# Patient Record
Sex: Male | Born: 1935 | Race: White | Hispanic: No | Marital: Married | Smoking: Former smoker
Health system: Southern US, Community
[De-identification: ages and names within clinical notes are randomized; demographics above are authoritative.]

## PROBLEM LIST (undated history)

## (undated) DIAGNOSIS — G4733 Obstructive sleep apnea (adult) (pediatric): Secondary | ICD-10-CM

## (undated) DIAGNOSIS — D696 Thrombocytopenia, unspecified: Secondary | ICD-10-CM

## (undated) DIAGNOSIS — J449 Chronic obstructive pulmonary disease, unspecified: Secondary | ICD-10-CM

## (undated) DIAGNOSIS — C3490 Malignant neoplasm of unspecified part of unspecified bronchus or lung: Secondary | ICD-10-CM

## (undated) DIAGNOSIS — G250 Essential tremor: Principal | ICD-10-CM

## (undated) DIAGNOSIS — D693 Immune thrombocytopenic purpura: Secondary | ICD-10-CM

## (undated) DIAGNOSIS — I714 Abdominal aortic aneurysm, without rupture: Secondary | ICD-10-CM

## (undated) DIAGNOSIS — H532 Diplopia: Secondary | ICD-10-CM

## (undated) DIAGNOSIS — E669 Obesity, unspecified: Secondary | ICD-10-CM

## (undated) DIAGNOSIS — N200 Calculus of kidney: Secondary | ICD-10-CM

## (undated) DIAGNOSIS — N400 Enlarged prostate without lower urinary tract symptoms: Secondary | ICD-10-CM

## (undated) DIAGNOSIS — E1142 Type 2 diabetes mellitus with diabetic polyneuropathy: Secondary | ICD-10-CM

## (undated) DIAGNOSIS — D126 Benign neoplasm of colon, unspecified: Secondary | ICD-10-CM

## (undated) DIAGNOSIS — C679 Malignant neoplasm of bladder, unspecified: Secondary | ICD-10-CM

## (undated) DIAGNOSIS — R059 Cough, unspecified: Secondary | ICD-10-CM

## (undated) DIAGNOSIS — S42309A Unspecified fracture of shaft of humerus, unspecified arm, initial encounter for closed fracture: Secondary | ICD-10-CM

## (undated) DIAGNOSIS — R05 Cough: Secondary | ICD-10-CM

## (undated) DIAGNOSIS — E785 Hyperlipidemia, unspecified: Secondary | ICD-10-CM

## (undated) DIAGNOSIS — I10 Essential (primary) hypertension: Secondary | ICD-10-CM

## (undated) DIAGNOSIS — C3491 Malignant neoplasm of unspecified part of right bronchus or lung: Secondary | ICD-10-CM

## (undated) DIAGNOSIS — G4700 Insomnia, unspecified: Secondary | ICD-10-CM

## (undated) DIAGNOSIS — I2510 Atherosclerotic heart disease of native coronary artery without angina pectoris: Secondary | ICD-10-CM

## (undated) DIAGNOSIS — I219 Acute myocardial infarction, unspecified: Secondary | ICD-10-CM

## (undated) DIAGNOSIS — Z8619 Personal history of other infectious and parasitic diseases: Secondary | ICD-10-CM

## (undated) DIAGNOSIS — K439 Ventral hernia without obstruction or gangrene: Secondary | ICD-10-CM

## (undated) DIAGNOSIS — G252 Other specified forms of tremor: Principal | ICD-10-CM

## (undated) DIAGNOSIS — R269 Unspecified abnormalities of gait and mobility: Secondary | ICD-10-CM

## (undated) DIAGNOSIS — Z720 Tobacco use: Secondary | ICD-10-CM

## (undated) DIAGNOSIS — N2889 Other specified disorders of kidney and ureter: Secondary | ICD-10-CM

## (undated) DIAGNOSIS — K5790 Diverticulosis of intestine, part unspecified, without perforation or abscess without bleeding: Secondary | ICD-10-CM

## (undated) DIAGNOSIS — N189 Chronic kidney disease, unspecified: Secondary | ICD-10-CM

## (undated) HISTORY — DX: Benign prostatic hyperplasia without lower urinary tract symptoms: N40.0

## (undated) HISTORY — DX: Unspecified fracture of shaft of humerus, unspecified arm, initial encounter for closed fracture: S42.309A

## (undated) HISTORY — DX: Chronic kidney disease, unspecified: N18.9

## (undated) HISTORY — DX: Malignant neoplasm of unspecified part of unspecified bronchus or lung: C34.90

## (undated) HISTORY — DX: Other specified disorders of kidney and ureter: N28.89

## (undated) HISTORY — DX: Atherosclerotic heart disease of native coronary artery without angina pectoris: I25.10

## (undated) HISTORY — DX: Immune thrombocytopenic purpura: D69.3

## (undated) HISTORY — DX: Insomnia, unspecified: G47.00

## (undated) HISTORY — DX: Calculus of kidney: N20.0

## (undated) HISTORY — DX: Other specified forms of tremor: G25.2

## (undated) HISTORY — DX: Diplopia: H53.2

## (undated) HISTORY — DX: Malignant neoplasm of bladder, unspecified: C67.9

## (undated) HISTORY — DX: Obesity, unspecified: E66.9

## (undated) HISTORY — PX: CYSTECTOMY: SUR359

## (undated) HISTORY — DX: Personal history of other infectious and parasitic diseases: Z86.19

## (undated) HISTORY — PX: LUNG LOBECTOMY: SHX167

## (undated) HISTORY — DX: Tobacco use: Z72.0

## (undated) HISTORY — DX: Diverticulosis of intestine, part unspecified, without perforation or abscess without bleeding: K57.90

## (undated) HISTORY — PX: TRANSURETHRAL RESECTION OF PROSTATE: SHX73

## (undated) HISTORY — PX: TONSILLECTOMY: SUR1361

## (undated) HISTORY — DX: Malignant neoplasm of unspecified part of right bronchus or lung: C34.91

## (undated) HISTORY — DX: Type 2 diabetes mellitus with diabetic polyneuropathy: E11.42

## (undated) HISTORY — DX: Abdominal aortic aneurysm, without rupture: I71.4

## (undated) HISTORY — DX: Chronic obstructive pulmonary disease, unspecified: J44.9

## (undated) HISTORY — DX: Obstructive sleep apnea (adult) (pediatric): G47.33

## (undated) HISTORY — DX: Ventral hernia without obstruction or gangrene: K43.9

## (undated) HISTORY — PX: CARDIAC CATHETERIZATION: SHX172

## (undated) HISTORY — DX: Thrombocytopenia, unspecified: D69.6

## (undated) HISTORY — DX: Hyperlipidemia, unspecified: E78.5

## (undated) HISTORY — DX: Unspecified abnormalities of gait and mobility: R26.9

## (undated) HISTORY — PX: CYSTOSTOMY W/ BLADDER BIOPSY: SHX1431

## (undated) HISTORY — DX: Benign neoplasm of colon, unspecified: D12.6

## (undated) HISTORY — DX: Essential tremor: G25.0

---

## 1994-07-21 DIAGNOSIS — C679 Malignant neoplasm of bladder, unspecified: Secondary | ICD-10-CM

## 1994-07-21 HISTORY — DX: Malignant neoplasm of bladder, unspecified: C67.9

## 2001-01-07 ENCOUNTER — Other Ambulatory Visit: Admission: RE | Admit: 2001-01-07 | Discharge: 2001-01-07 | Payer: Self-pay | Admitting: Dermatology

## 2001-09-15 ENCOUNTER — Ambulatory Visit (HOSPITAL_COMMUNITY): Admission: RE | Admit: 2001-09-15 | Discharge: 2001-09-15 | Payer: Self-pay | Admitting: Internal Medicine

## 2001-09-15 HISTORY — PX: COLONOSCOPY: SHX174

## 2002-07-21 DIAGNOSIS — I714 Abdominal aortic aneurysm, without rupture, unspecified: Secondary | ICD-10-CM

## 2002-07-21 HISTORY — PX: ABDOMINAL AORTIC ANEURYSM REPAIR: SUR1152

## 2002-07-21 HISTORY — DX: Abdominal aortic aneurysm, without rupture, unspecified: I71.40

## 2002-07-21 HISTORY — DX: Abdominal aortic aneurysm, without rupture: I71.4

## 2003-03-26 ENCOUNTER — Emergency Department (HOSPITAL_COMMUNITY): Admission: EM | Admit: 2003-03-26 | Discharge: 2003-03-26 | Payer: Self-pay | Admitting: *Deleted

## 2003-05-12 ENCOUNTER — Ambulatory Visit (HOSPITAL_COMMUNITY): Admission: RE | Admit: 2003-05-12 | Discharge: 2003-05-12 | Payer: Self-pay | Admitting: Family Medicine

## 2003-05-12 ENCOUNTER — Encounter: Payer: Self-pay | Admitting: Family Medicine

## 2003-12-26 ENCOUNTER — Ambulatory Visit (HOSPITAL_BASED_OUTPATIENT_CLINIC_OR_DEPARTMENT_OTHER): Admission: RE | Admit: 2003-12-26 | Discharge: 2003-12-26 | Payer: Self-pay | Admitting: Family Medicine

## 2004-11-28 ENCOUNTER — Ambulatory Visit (HOSPITAL_COMMUNITY): Admission: RE | Admit: 2004-11-28 | Discharge: 2004-11-28 | Payer: Self-pay | Admitting: Internal Medicine

## 2004-11-28 ENCOUNTER — Ambulatory Visit: Payer: Self-pay | Admitting: Internal Medicine

## 2004-11-28 HISTORY — PX: COLONOSCOPY: SHX174

## 2005-07-30 ENCOUNTER — Emergency Department (HOSPITAL_COMMUNITY): Admission: EM | Admit: 2005-07-30 | Discharge: 2005-07-30 | Payer: Self-pay | Admitting: Emergency Medicine

## 2005-08-02 ENCOUNTER — Emergency Department (HOSPITAL_COMMUNITY): Admission: EM | Admit: 2005-08-02 | Discharge: 2005-08-02 | Payer: Self-pay | Admitting: Emergency Medicine

## 2008-06-14 ENCOUNTER — Ambulatory Visit: Admission: RE | Admit: 2008-06-14 | Discharge: 2008-06-14 | Payer: Self-pay | Admitting: Family Medicine

## 2010-01-16 ENCOUNTER — Ambulatory Visit (HOSPITAL_COMMUNITY): Admission: RE | Admit: 2010-01-16 | Discharge: 2010-01-16 | Payer: Self-pay | Admitting: Family Medicine

## 2010-02-08 ENCOUNTER — Encounter: Payer: Self-pay | Admitting: Internal Medicine

## 2010-02-14 ENCOUNTER — Telehealth (INDEPENDENT_AMBULATORY_CARE_PROVIDER_SITE_OTHER): Payer: Self-pay | Admitting: *Deleted

## 2010-02-14 ENCOUNTER — Encounter: Payer: Self-pay | Admitting: Internal Medicine

## 2010-02-20 ENCOUNTER — Ambulatory Visit: Payer: Self-pay | Admitting: Internal Medicine

## 2010-02-20 ENCOUNTER — Ambulatory Visit (HOSPITAL_COMMUNITY): Admission: RE | Admit: 2010-02-20 | Discharge: 2010-02-20 | Payer: Self-pay | Admitting: Internal Medicine

## 2010-02-23 ENCOUNTER — Encounter: Payer: Self-pay | Admitting: Internal Medicine

## 2010-03-19 HISTORY — PX: COLONOSCOPY: SHX174

## 2010-07-21 DIAGNOSIS — N200 Calculus of kidney: Secondary | ICD-10-CM

## 2010-07-21 HISTORY — DX: Calculus of kidney: N20.0

## 2010-08-20 NOTE — Letter (Signed)
Summary: External Correspondence  External Correspondence   Imported By: Craige Cotta 02/14/2010 17:13:49  _____________________________________________________________________  External Attachment:    Type:   Image     Comment:   External Document

## 2010-08-20 NOTE — Letter (Signed)
Summary: Patient Notice, Colon Biopsy Results  [facility]  [address]   [place], [place] [address]   Phone: [phone]  Fax: [phone]       February 23, 2010   SCOTTE GILDEA [address] [place], [place]  [address] 1934-04-04    Dear Mr. Novelo,  I am pleased to inform you that the biopsies taken during your recent colonoscopy did not show any evidence of cancer upon pathologic examination.  Additional information/recommendations:  No further action is needed at this time.  Please follow-up with your primary care physician for your other healthcare needs.  You should have a repeat colonoscopy examination  in 5 years.  Please call us if you are having persistent problems or have questions about your condition that have not been fully answered at this time.  Sincerely,    R. Garfield Cornea MD, [facility] Ph: [phone]    Fax: [phone]   Appended Document: Patient Notice, Colon Biopsy Results letter mailed to pt  Appended Document: Patient Notice, Colon Biopsy Results reminder in computer

## 2010-08-20 NOTE — Progress Notes (Signed)
Summary: diabetic and iron pills  Phone Note Call from Patient   Reason for Call: Talk to Nurse Summary of Call: PT called this morning to let us know that his PCP said he was a diabetic, but is being treated through diet. He also is taking an iron supplement pill (OTC) Initial call taken by: Zeb Comfort,  February 14, 2010 11:21 AM

## 2010-08-20 NOTE — Letter (Signed)
Summary: Internal Other Kyle [name]  Internal Other Kyle [name]   Imported By: Waldon Merl LPN [id number]  _____________________________________________________________________  External Attachment:    Type:   Image     Comment:   External Document  Appended Document: Internal Other Kyle [name] Needing to change date or time of procedure due to double book of OR that day.  Have pt talk to Leafy Ro he is her husband's uncle and it would probably be easier to [facility]  Appended Document: Internal Other /triage Moved him down one hour on same day's schedule.  Patient aware and he has asked [place] to contact nephrologist in [place] RE: the use of the fleet enema -- Dr. Veva Holes [phone].Marland KitchenMarland Kitchenwe will f/u and call him back  Appended Document: Internal Other /triage [facility] to not do fleets enema and that we had received documentation from Dr. Shon Millet (see external other scanned doc) confirming this.  Explained to pour out fleets and fill with tap water instead.  And, to call if he still had further Qs

## 2010-10-04 LAB — GLUCOSE, CAPILLARY: Glucose-Capillary: 133 mg/dL — ABNORMAL HIGH (ref 70–99)

## 2010-12-03 NOTE — Procedures (Signed)
NAME:  Kyle [name], Kyle [name]                ACCOUNT NO.:  [id number]   MEDICAL RECORD NO.:  [id number]          PATIENT TYPE:  OUT   LOCATION:  SLEE                          FACILITY:  [facility]   PHYSICIAN:  Kofi A. Merlene Laughter, M.D. DATE OF BIRTH:  02/04/1934   DATE OF PROCEDURE:  06/14/2008  DATE OF DISCHARGE:  06/14/2008                             SLEEP DISORDER REPORT   This is a nocturnal polysomnography report.   REFERRING PHYSICIAN:  Rosemary Holms, MD recording date June 14, 2008.   INDICATIONS:  A 77 year old man who has a known history of obstructive  syndrome.   MEDICATIONS:  1. Metoprolol.  2. Enalapril.  3. Simvastatin.  4. Aspirin.   Epworth sleepiness scale 15, BMI 38.   ARCHITECTURAL SUMMARY:  This is a titration study.  The total recording  time is 410 minutes.  Sleep efficiency 79%, sleep latency 10 minutes,  REM latency 149 minutes.  Stage N1 - 9%, N2 - 72%, N3 - 1% and REM sleep  18%.   RESPIRATORY SUMMARY:  The patient was titrated between pressures of 6  and 8 with optimal pressure of 8 observed which resulted in elimination  of obstructive events.   LIMB MOVEMENT SUMMARY:  PLM index is 19.   ELECTROCARDIOGRAM SUMMARY:  Average heart rate 63 with no significant dysrhythmias observed.   IMPRESSION:  1. Obstructive sleep apnea syndrome which responded well to a      continuous positive airway pressure of 8.  2. Moderate periodic limb movement disorder sleep.   Thank you for this referral      Kofi A. Merlene Laughter, M.D.  Electronically Signed     KAD/MEDQ  D:  06/19/2008  T:  06/19/2008  Job:  [id number]

## 2010-12-06 NOTE — Op Note (Signed)
NAME:  Kyle [name], Kyle [name]                ACCOUNT NO.:  [id number]   MEDICAL RECORD NO.:  [id number]          PATIENT TYPE:  AMB   LOCATION:  DAY                           FACILITY:  [facility]   PHYSICIAN:  R. Garfield Cornea, M.D. DATE OF BIRTH:  January 18, 1934   DATE OF PROCEDURE:  11/28/2004  DATE OF DISCHARGE:                                 OPERATIVE REPORT   PROCEDURE:  Colonoscopy with biopsy.   INDICATIONS FOR PROCEDURE:  The patient is a 77 year old gentleman with a  history of colonic polyps removed in 2003. He is here for surveillance. He  is not having any problems. No GI symptoms whatsoever. Colonoscopy is now  being. Potential risks, benefits, and alternatives have been reviewed and  questions answered. He is agreeable. Please see documentation in the medical  record.   PROCEDURE NOTE:  O2 saturation, blood pressure, pulse, and respirations were  monitored throughout the entire procedure. Conscious sedation with Versed 3  mg IV and Demerol 50 mg IV in divided doses.   INSTRUMENT:  Olympus video chip system.   FINDINGS:  Digital rectal exam revealed no abnormalities.   ENDOSCOPIC FINDINGS:  Prep was good.   Rectum:  Examination of the rectal mucosa including retroflexed view of the  anal verge revealed 3-mm polyp 5 cm anal verge. Otherwise, rectal mucosal  appeared normal.   Colon:  Colonic mucosa was surveyed from the rectosigmoid junction through  the left, transverse, and right colon to the area of the appendiceal  orifice, ileocecal valve, and cecum. These structures were well seen and  photographed for the record. Olympus videoscope was slowly withdrawn. All  previously mentioned mucosal surfaces were again seen. The patient had two 3-  mm polyps at the splenic flexure which were cold biopsied/removed. The  patient also had left sided diverticula. Remainder of colonic mucosa  appeared normal. The patient tolerated the procedure well and was reactive  to endoscopy.   IMPRESSION:  1.  Diminutive rectal and left colon polyps as described above, cold      biopsied/removed.  2.  Left sided diverticula. The remainder of the colonic mucosa appeared      normal.   RECOMMENDATIONS:  1.  Diverticulosis literature provided to Mr. Ciolli.  2.  Follow up on pathology.  3.  Further recommendations to follow.      RMR/MEDQ  D:  11/28/2004  T:  11/28/2004  Job:  [id number]

## 2010-12-06 NOTE — Op Note (Signed)
[facility]  Patient:    Kyle [name], Kyle A. Visit Number: [id number] MRN: [id number]          Service Type: Attending:  Garfield Cornea, M.D. Dictated by:   Garfield Cornea, M.D. Proc. Date: 09/15/01   CC:         Margaretmary Eddy, M.D.   Operative Report  PROCEDURE:  Colonoscopy with snare polypectomy.  INDICATIONS:  The patient is a 77 year old gentleman referred by Dr. Mickie Hillier for colorectal cancer screening.  He is devoid of any GI symptoms, has never had his lower GI tract evaluated.  There is no family history of colorectal cancer.  Colonoscopy is now being down as part of standard screening program.   This approach has been discussed with Mr. Harstad at length.  Potential risks, benefits, and alternatives have been reviewed with questions answered. He is agreeable.  Please see my handwritten H&P for more information.  GASTROENTEROLOGIST:  Garfield Cornea, M.D.  PROCEDURE NOTE:  O2 saturation, blood pressure, pulses of this patient were monitored throughout the entire procedure.  CONSCIOUS SEDATION:  Versed 75 mg IV, Demerol 4 mg IV in divided doses.  INSTRUMENT: Olympus video chip colonoscope.  FINDINGS:  Digital rectal examination revealed no abnormalities.  ENDOSCOPIC FINDINGS:  Prep was marginal.  Rectum:  Examination of rectal mucosa including retroflexed view of the anal verge revealed multiple diminutive polyps.  COLON:  Colonic mucosa was surveyed from the rectosigmoid junction through the left transverse right colon to the area of the appendiceal orifice, ileocecal valve, and cecum.  These structures were well seen and photographed.  The patient has a somewhat tortuous colon.  There was quite a bit of granular liquid stool that had to be dealt with by suctioning and washing throughout the colon.  There were also multiple 0.5 to 0.75 cm polyps on stalks throughout the low transverse and right colon.  The cecum and ileocecal  valve were well seen and photographed.  From this level, the scope was slowly withdrawn.  All previously mentioned mucosal surfaces were again seen. Multiple polyps on stalks throughout the colon as outlined above were snared and recovered.  The diminutive polyps in the rectum were destroyed with the tip of the snare cautery.  The patient also had scattered pan colonic diverticulum.  The patient tolerated the procedure well and was reacted in endoscopy.  IMPRESSION: 1. Multiple diminutive polyps destroyed with dermolysis as described above. 2. Multiple small polyps on stalks in the colon resected with snare cautery. 3. Scattered pan colonic diverticulum. 4. The remainder of the colonic mucosa appeared normal.  RECOMMENDATIONS: 1. No aspirin or arthritis medications for the next 10 days. 2. Follow up on pathology. 3. Diverticulosis literature provided to Mr. Hutchings. 4. Further recommendations to follow. Dictated by:   Garfield Cornea, M.D. Attending:  Garfield Cornea, M.D. DD:  09/15/01 TD:  09/15/01 Job: [id number]

## 2010-12-19 ENCOUNTER — Emergency Department (HOSPITAL_COMMUNITY): Payer: Medicare Other

## 2010-12-19 ENCOUNTER — Observation Stay (HOSPITAL_COMMUNITY)
Admission: EM | Admit: 2010-12-19 | Discharge: 2010-12-19 | Disposition: A | Discharge status: Nursing Home (Includes ICF) | Payer: Medicare Other | Source: Home / Self Care | Attending: Emergency Medicine | Admitting: Emergency Medicine

## 2010-12-19 ENCOUNTER — Inpatient Hospital Stay (HOSPITAL_COMMUNITY)
Admission: AD | Admit: 2010-12-19 | Discharge: 2010-12-21 | DRG: 282 | Disposition: A | Discharge status: Home / Self Care | Payer: Medicare Other | Source: Other Acute Inpatient Hospital | Attending: Cardiology | Admitting: Cardiology

## 2010-12-19 DIAGNOSIS — Z7982 Long term (current) use of aspirin: Secondary | ICD-10-CM

## 2010-12-19 DIAGNOSIS — N189 Chronic kidney disease, unspecified: Secondary | ICD-10-CM | POA: Diagnosis present

## 2010-12-19 DIAGNOSIS — I129 Hypertensive chronic kidney disease with stage 1 through stage 4 chronic kidney disease, or unspecified chronic kidney disease: Secondary | ICD-10-CM | POA: Diagnosis present

## 2010-12-19 DIAGNOSIS — E119 Type 2 diabetes mellitus without complications: Secondary | ICD-10-CM | POA: Diagnosis present

## 2010-12-19 DIAGNOSIS — I2510 Atherosclerotic heart disease of native coronary artery without angina pectoris: Secondary | ICD-10-CM | POA: Diagnosis present

## 2010-12-19 DIAGNOSIS — G4733 Obstructive sleep apnea (adult) (pediatric): Secondary | ICD-10-CM | POA: Diagnosis present

## 2010-12-19 DIAGNOSIS — R0902 Hypoxemia: Secondary | ICD-10-CM | POA: Diagnosis not present

## 2010-12-19 DIAGNOSIS — F17200 Nicotine dependence, unspecified, uncomplicated: Secondary | ICD-10-CM | POA: Diagnosis present

## 2010-12-19 DIAGNOSIS — J449 Chronic obstructive pulmonary disease, unspecified: Secondary | ICD-10-CM | POA: Diagnosis present

## 2010-12-19 DIAGNOSIS — I252 Old myocardial infarction: Secondary | ICD-10-CM

## 2010-12-19 DIAGNOSIS — T7840XA Allergy, unspecified, initial encounter: Secondary | ICD-10-CM | POA: Diagnosis present

## 2010-12-19 DIAGNOSIS — E785 Hyperlipidemia, unspecified: Secondary | ICD-10-CM | POA: Diagnosis present

## 2010-12-19 DIAGNOSIS — Z79899 Other long term (current) drug therapy: Secondary | ICD-10-CM

## 2010-12-19 DIAGNOSIS — J4489 Other specified chronic obstructive pulmonary disease: Secondary | ICD-10-CM | POA: Diagnosis present

## 2010-12-19 DIAGNOSIS — I214 Non-ST elevation (NSTEMI) myocardial infarction: Secondary | ICD-10-CM

## 2010-12-19 DIAGNOSIS — N400 Enlarged prostate without lower urinary tract symptoms: Secondary | ICD-10-CM | POA: Diagnosis present

## 2010-12-19 DIAGNOSIS — D696 Thrombocytopenia, unspecified: Secondary | ICD-10-CM | POA: Diagnosis present

## 2010-12-19 DIAGNOSIS — Z8551 Personal history of malignant neoplasm of bladder: Secondary | ICD-10-CM

## 2010-12-19 DIAGNOSIS — R079 Chest pain, unspecified: Secondary | ICD-10-CM

## 2010-12-19 DIAGNOSIS — X58XXXA Exposure to other specified factors, initial encounter: Secondary | ICD-10-CM

## 2010-12-19 LAB — MRSA PCR SCREENING: MRSA by PCR: NEGATIVE

## 2010-12-19 LAB — CK TOTAL AND CKMB (NOT AT ARMC)
CK, MB: 4.2 ng/mL — ABNORMAL HIGH (ref 0.3–4.0)
Total CK: 109 U/L (ref 7–232)

## 2010-12-19 LAB — BASIC METABOLIC PANEL
CO2: 25 mEq/L (ref 19–32)
Calcium: 9.9 mg/dL (ref 8.4–10.5)
Creatinine, Ser: 1.7 mg/dL — ABNORMAL HIGH (ref 0.4–1.5)
GFR calc Af Amer: 48 mL/min — ABNORMAL LOW (ref >60)
Glucose, Bld: 242 mg/dL — ABNORMAL HIGH (ref 70–99)

## 2010-12-19 LAB — CARDIAC PANEL(CRET KIN+CKTOT+MB+TROPI)
Troponin I: 2.14 ng/mL (ref <0.30)
Troponin I: 4.43 ng/mL (ref <0.30)

## 2010-12-19 LAB — CBC
Hemoglobin: 16.3 g/dL (ref 13.0–17.0)
MCHC: 33.1 g/dL (ref 30.0–36.0)
RBC: 5.15 MIL/uL (ref 4.22–5.81)

## 2010-12-19 LAB — HEMOGLOBIN A1C
Hgb A1c MFr Bld: 7.1 % — ABNORMAL HIGH (ref <5.7)
Mean Plasma Glucose: 157 mg/dL — ABNORMAL HIGH (ref <117)

## 2010-12-19 LAB — DIFFERENTIAL
Basophils Absolute: 0 10*3/uL (ref 0.0–0.1)
Basophils Relative: 0 % (ref 0–1)
Monocytes Absolute: 0.5 10*3/uL (ref 0.1–1.0)
Neutro Abs: 4.8 10*3/uL (ref 1.7–7.7)
Neutrophils Relative %: 63 % (ref 43–77)

## 2010-12-19 LAB — TSH: TSH: 3.12 u[IU]/mL (ref 0.350–4.500)

## 2010-12-19 LAB — APTT: aPTT: 42 seconds — ABNORMAL HIGH (ref 24–37)

## 2010-12-19 LAB — PROTIME-INR
INR: 1.1 (ref 0.00–1.49)
Prothrombin Time: 14.4 seconds (ref 11.6–15.2)

## 2010-12-20 DIAGNOSIS — I2510 Atherosclerotic heart disease of native coronary artery without angina pectoris: Secondary | ICD-10-CM

## 2010-12-20 DIAGNOSIS — I059 Rheumatic mitral valve disease, unspecified: Secondary | ICD-10-CM

## 2010-12-20 LAB — CARDIAC PANEL(CRET KIN+CKTOT+MB+TROPI)
Total CK: 151 U/L (ref 7–232)
Troponin I: 2.98 ng/mL (ref <0.30)

## 2010-12-20 LAB — BASIC METABOLIC PANEL
GFR calc non Af Amer: 48 mL/min — ABNORMAL LOW (ref >60)
Potassium: 4 mEq/L (ref 3.5–5.1)
Sodium: 142 mEq/L (ref 135–145)

## 2010-12-20 LAB — DIFFERENTIAL
Basophils Absolute: 0 10*3/uL (ref 0.0–0.1)
Basophils Relative: 0 % (ref 0–1)
Eosinophils Relative: 3 % (ref 0–5)
Lymphocytes Relative: 28 % (ref 12–46)
Neutro Abs: 3.8 10*3/uL (ref 1.7–7.7)

## 2010-12-20 LAB — CBC
HCT: 44.3 % (ref 39.0–52.0)
Hemoglobin: 14.7 g/dL (ref 13.0–17.0)
RDW: 14.7 % (ref 11.5–15.5)
WBC: 6.3 10*3/uL (ref 4.0–10.5)

## 2010-12-20 LAB — LIPID PANEL
Total CHOL/HDL Ratio: 4.6 RATIO
VLDL: 33 mg/dL (ref 0–40)

## 2010-12-20 LAB — HEPARIN LEVEL (UNFRACTIONATED): Heparin Unfractionated: 0.93 IU/mL — ABNORMAL HIGH (ref 0.30–0.70)

## 2010-12-20 LAB — GLUCOSE, CAPILLARY: Glucose-Capillary: 185 mg/dL — ABNORMAL HIGH (ref 70–99)

## 2010-12-21 LAB — CBC
HCT: 45.2 % (ref 39.0–52.0)
Hemoglobin: 15.2 g/dL (ref 13.0–17.0)
RDW: 14.6 % (ref 11.5–15.5)
WBC: 7.1 10*3/uL (ref 4.0–10.5)

## 2010-12-21 LAB — BASIC METABOLIC PANEL
GFR calc non Af Amer: 51 mL/min — ABNORMAL LOW (ref >60)
Glucose, Bld: 145 mg/dL — ABNORMAL HIGH (ref 70–99)
Potassium: 4 mEq/L (ref 3.5–5.1)
Sodium: 143 mEq/L (ref 135–145)

## 2010-12-21 LAB — LIPID PANEL
Cholesterol: 145 mg/dL (ref 0–200)
LDL Cholesterol: 51 mg/dL (ref 0–99)
Total CHOL/HDL Ratio: 5.2 RATIO

## 2010-12-26 NOTE — H&P (Addendum)
NAMESHIKEEM, SCHWARK                ACCOUNT NO.:  [id number]  MEDICAL RECORD NO.:  [id number]           PATIENT TYPE:  O  LOCATION:  A327                          FACILITY:  [facility]  PHYSICIAN:  Adriana Lina L. Conley Canal, MDDATE OF BIRTH:  February 07, 1934  DATE OF ADMISSION:  12/19/2010 DATE OF DISCHARGE:  LH                             HISTORY & PHYSICAL   CHIEF COMPLAINT:  Chest pain.  HISTORY OF PRESENT ILLNESS:  Mr. Eshbach is a 77 year old white male with multiple medical problems including MI 825 years ago.  He presented with severe substernal chest pain that woke him up from sleep tonight.  It was a pressure.  He had no accompanying symptoms.  He had felt it previously several days prior to admission multiple times.  It has not necessarily been related to exertion.  Lying down seemed to make it better.  Earlier this week, the episodes usually lasted about 15 minutes and then went away.  Tonight's episode did not, so he came to theemergency room.  Apparently, the pain started at around 2 a.m.  He did not come to the emergency room until about 5 this morning.  The patient had previously been trying to drinks soda.  Belching seemed to help with the discomfort.  He took two aspirin at home.  He was evaluated by the ED physician who gave another aspirin, placed nitroglycerin and gave IV Protonix.  His EKG showed some lateral ST depression, but we have no old EKG for comparison.  He has no chest pain currently.  The ED physician wrote "quick admit orders".  Apparently, the patient initially had wanted to go home, but the ED physician discussed the case with Dr. Mickie Hillier, the patient's primary care physician who felt that the patient needed to be admitted and evaluated.  The patient's first set of cardiac enzymes were negative.  The patient arrived on the floor at around 10, but I was not notified by staff and the patient was seen at about 11:30 this morning.  The patient had reportedly a  cardiac catheterization by Dr. Olevia Perches 25 years ago.  His wife reports that there was "something wrong with the blood vessel in the back of the heart." The patient has not had a stress test since then.  He has had no further cardiac problems and is not followed by a cardiologist.  Apparently at that time, he was told to quit smoking, but he continues to smoke. Since I examined the patient, the patient's second set of cardiac enzymes has come back and his troponin is 2.  A repeat echocardiogram shows correction of the previously mentioned ST changes.  I have discussed the case with Dr. Aundra Dubin and we will transfer the patient to [facility] step-down unit.  He will be admitted to Dr. Claris Gladden Service there.  He will receive Lovenox and metoprolol.  He has no chest pain currently.  PAST MEDICAL HISTORY:  As above. 1. Hypertension. 2. Reportedly diet-controlled diabetes. 3. Rosacea. 4. History of abdominal aortic aneurysm repair. 5. Obstructive sleep apnea. 6. History of bladder cancer, status post resection. 7. Benign prostatic hypertrophy with  history of TURP. 8. Hyperlipidemia. 9. Tobacco abuse. 10.Chronic kidney disease. 11.Seasonal allergic rhinitis. 12.Chronic insomnia.  MEDICATIONS: 1. Amlodipine. 2. Enalapril. 3. Metoprolol. 4. Niaspan. 5. Pravastatin. 6. Allegra. 7. Fluticasone. 8. Iron. 9. Vitamin D2. 10.Doxycycline. 11.Chronically aspirin 81 mg a day. 12.Alprazolam, doses all otherwise unknown.  ALLERGIES:  No known drug allergies.  SOCIAL HISTORY:  The patient is married and here with his wife and son. He smokes less than a pack a day.  He has no history of alcohol or drug abuse.  He is retired.  FAMILY HISTORY:  His mother died in her 27s.  His father died of abdominal aortic aneurysm.  PAST SURGICAL HISTORY: 1. AAA repair. 2. Bladder cancer resection x3. 3. TURP. 4. Tonsillectomy.  REVIEW OF SYSTEMS:  CONSTITUTIONAL:  No fevers, chills, weight loss  or weight gain.  HEENT:  He has sinus headache in the left frontal area with a lot of postnasal drip.  He has a cough productive of clear sputum, which he attributes to the sinus drainage.  RESPIRATORY:  As above.  No shortness of breath.  CARDIOVASCULAR:  As above.  No palpitations.  GI:  No nausea, vomiting, diarrhea.  GU:  No dysuria, hematuria.  ENDOCRINE:  As above.  He reports that his blood sugar usually runs no higher than 160.  His last hemoglobin A1c was reportedly 6.4 or so.  SKIN:  As above.  PSYCHIATRIC:  No depression.  NEUROLOGIC: No history of stroke or seizure.  HEMATOLOGIC:  No history of thromboembolic phenomenon or bleeding disorders.  MUSCULOSKELETAL:  He has back pain if he stands for too long.  PHYSICAL EXAMINATION:  VITAL SIGNS:  Temperature is 97.9, blood pressure 165/92, pulse 78, respiratory rate 22, oxygen saturation 95% on room air. GENERAL:  The patient is an overweight white male watching TV and is talking to his wife, in no acute distress. HEENT:  Normocephalic, atraumatic.  Pupils equal, round, reactive to light.  Sclerae nonicteric.  Moist mucous membranes. NECK:  Supple, thick.  No lymphadenopathy.  No thyromegaly. LUNGS:  Clear to auscultation bilaterally without wheezes, rhonchi or rales. CARDIOVASCULAR:  Regular rate and rhythm without murmurs, gallops or rubs. ABDOMEN:  Obese, soft, nontender. GU AND RECTAL:  Deferred. EXTREMITIES:  He has trace pitting edema.  Pulses are intact. MUSCULOSKELETAL:  He has no chest wall tenderness. NEUROLOGIC:  Alert and oriented.  Cranial nerves and sensorimotor exam are intact. PSYCHIATRIC:  Normal affect. SKIN:  No rash.  LABS:  CBC is significant for a platelet count of 109,000.  Basic metabolic panel significant for a glucose of 242, creatinine 1.7, BUN is 23, creatinine in 2009 was about 1.5.  I do not have any more recent values.  CPK at 5:55 a.m. is 109, MB fraction 4.2, index 3.9, troponin less than  0.3.  A repeat CPK which just came back is 187, CPK-MB is 14, relative index of 7, troponin 2.14.  BNP 201.  Initial EKG done at 5:40 a.m. shows normal sinus rhythm with PVCs fusion complex, left axis deviation, incomplete right bundle branch block, age indeterminate septal infarct, lateral ST depression.  No old EKG for comparison. Repeat EKG that I just ordered stat shows normal sinus rhythm, left anterior facility for block with resolution of the ST changes.  Chest x- ray shows mild bibasilar atelectasis.  ASSESSMENT AND PLAN: 1. Non-ST segment elevation myocardial infarction:  Please see HPI for     details.  The patient has already received aspirin.  We will  give     IV metoprolol and Lovenox.  I will keep him n.p.o. until he can be     evaluated by Dr. Aundra Dubin.  He will accept him in the step-down unit.     The patient is currently chest pain free and is awaiting     transferred to [facility] via [place].  The patient is agreeable with     the plan. 2. Renal, probable chronic kidney disease with creatinine of 1.7     today. 3. Hypertension. 4. Hyperlipidemia. 5. Diabetes, appears no longer diet-controlled.  He will need Accu-     Cheks and we will most likely need to be on an agent long-term.     The hospitalist at [facility] can certainly consult if Dr. Aundra Dubin     desires assistance. 6. Acute sinusitis.  I had started the patient on amoxicillin 500 mg     p.o. t.i.d., which he can continue for 10 days.  Continue his     fluticasone. 7. Chronic insomnia. 8. Obstructive sleep apnea.  I recommend continuing Z-PAK. 9. History of abdominal aortic aneurysm repair. 10.Hyperlipidemia.  I had ordered fasting lipids, but deferred all     further management to Dr. Aundra Dubin.  I would like to thank Dr. Aundra Dubin for his assistance in the care of this nice patient.  Critical care time 60 minutes.     Joeline Freer L. Conley Canal, MD     CLS/MEDQ  D:  12/19/2010  T:  12/19/2010  Job:   [id number]  cc:   Margaretmary Eddy, M.D. Fax: [phone]  Electronically Signed by Doree Barthel MD on 12/26/2010 07:27:36 AM

## 2011-01-06 ENCOUNTER — Encounter (HOSPITAL_COMMUNITY)
Admission: RE | Admit: 2011-01-06 | Discharge: 2011-01-06 | Disposition: A | Discharge status: Home / Self Care | Payer: Medicare Other | Source: Ambulatory Visit | Attending: Cardiology | Admitting: Cardiology

## 2011-01-06 ENCOUNTER — Other Ambulatory Visit: Payer: Self-pay

## 2011-01-06 ENCOUNTER — Encounter: Payer: Self-pay | Admitting: Adult Health

## 2011-01-06 ENCOUNTER — Ambulatory Visit (INDEPENDENT_AMBULATORY_CARE_PROVIDER_SITE_OTHER): Payer: Medicare Other | Admitting: Adult Health

## 2011-01-06 VITALS — BP 121/73 | HR 66 | Ht 71.0 in | Wt 271.0 lb

## 2011-01-06 DIAGNOSIS — I252 Old myocardial infarction: Secondary | ICD-10-CM | POA: Insufficient documentation

## 2011-01-06 DIAGNOSIS — I10 Essential (primary) hypertension: Secondary | ICD-10-CM

## 2011-01-06 DIAGNOSIS — I2510 Atherosclerotic heart disease of native coronary artery without angina pectoris: Secondary | ICD-10-CM | POA: Insufficient documentation

## 2011-01-06 DIAGNOSIS — N183 Chronic kidney disease, stage 3 unspecified: Secondary | ICD-10-CM | POA: Insufficient documentation

## 2011-01-06 DIAGNOSIS — Z5189 Encounter for other specified aftercare: Secondary | ICD-10-CM | POA: Insufficient documentation

## 2011-01-06 DIAGNOSIS — I219 Acute myocardial infarction, unspecified: Secondary | ICD-10-CM

## 2011-01-06 DIAGNOSIS — I739 Peripheral vascular disease, unspecified: Secondary | ICD-10-CM

## 2011-01-06 DIAGNOSIS — N181 Chronic kidney disease, stage 1: Secondary | ICD-10-CM

## 2011-01-06 DIAGNOSIS — J449 Chronic obstructive pulmonary disease, unspecified: Secondary | ICD-10-CM | POA: Insufficient documentation

## 2011-01-06 NOTE — Progress Notes (Signed)
**Note De-Identified Aideen Fenster Obfuscation** Addended by: Dennie Fetters on: 01/06/2011 04:54 PM   Modules accepted: Orders

## 2011-01-06 NOTE — Assessment & Plan Note (Signed)
The patient and his wife are asking for a referral to a pulmonologist. We are referring him to [facility] for evaluation of his breathing status and possible treatment.

## 2011-01-06 NOTE — Progress Notes (Signed)
HPI: [name] is a 77 y/o male patient Dr. Haroldine [name] who we are following to be established in the [place] office, who is s/p NSTEMI secondary to distal RCA/PL lesion on 12/19/2010, with preserved LV fx. He also has a history of hypertension, CKD (baseline 1.4-1.7), thrombocytopenia, PAD s/p AAA repair, diabetes, COPD and ongoing tobacco abuse.  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   It was elected by Dr. Haroldine [name] to treat him medically after review by him and by Dr. Lia [name] as the lesion was too small and not amenable to PCI.  However, if he fails medical therapy, he will be consideration for PCI of the CFX lesion.  Since discharge he is doing well, but remains mildly short of breath and easily fatigued. He has been enrolled in cardiac rehab and had his first class today.  His wife would like him to be referred to a pulmonologist for evaluation of his breathing status.  He has a history of COPD but is on no treatment for this. He quit smoking on Dec 19, 2010. He denies chest pain, or palpitations.  No Known Allergies  Current Outpatient Prescriptions  Medication Sig Dispense Refill  . acetaminophen (TYLENOL) 500 MG tablet Take 500 mg by mouth every 6 (six) hours as needed.        . ALPRAZolam (XANAX) 0.5 MG tablet Take 0.5 mg by mouth at bedtime as needed.        Marland Kitchen amLODipine (NORVASC) 5 MG tablet Take 5 mg by mouth daily.       Marland Kitchen aspirin 81 MG tablet Take 81 mg by mouth daily.        . clopidogrel (PLAVIX) 75 MG tablet Take 75 mg by mouth daily.        Marland Kitchen doxycycline (VIBRAMYCIN) 50 MG capsule Take 50 mg by mouth daily.       . enalapril (VASOTEC) 20 MG tablet Take 20 mg by mouth daily.       . ergocalciferol (VITAMIN D2) 50000 UNITS capsule Take 50,000 Units by mouth once a week.        . ferrous sulfate (FERROUSUL)  325 (65 FE) MG tablet Take 325 mg by mouth daily with breakfast.        . fexofenadine (ALLEGRA) 180 MG tablet Take 180 mg by mouth daily.        . fluticasone (VERAMYST) 27.5 MCG/SPRAY nasal spray Place 2 sprays into the nose daily.        . metoprolol (LOPRESSOR) 50 MG tablet Take 50 mg by mouth 2 (two) times daily.        Marland Kitchen NIASPAN 500 MG CR tablet Take 500 mg by mouth 2 (two) times daily.       Marland Kitchen NITROSTAT 0.4 MG SL tablet Place 0.4 mg under the tongue every 5 (five) minutes as needed.       . pravastatin (PRAVACHOL) 80 MG tablet Take 80 mg by mouth daily.       Marland Kitchen Propylhexedrine (BENZEDREX NA) Place into the nose.        Marland Kitchen DISCONTD: azithromycin (ZITHROMAX) 250 MG tablet Take 2 tablets by mouth on day 1, followed by 1 tablet by mouth daily for 4 days.        Past Medical History  Diagnosis Date  . Coronary artery disease  S/P NSTEMI secondary to distal RCA/PL lesion, tx medically.  EF of  55%-60% per  echo.  . Chronic kidney disease     Creatnine 1.4 on discharge 12/20/2100  . Diabetes mellitus     Type II  . Thrombocytopenia   . COPD (chronic obstructive pulmonary disease)   . AAA (abdominal aortic aneurysm)     s/p repair unknown date  . Tobacco abuse   . S/P cardiac cath 12/20/2010    Three vessel CAD, culpril lesion appears to be the distal RCA andpoterolater system.  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  . Hyperlipidemia   . OSA (obstructive sleep apnea)     Past Surgical History  Procedure Date  . Abdominal aortic aneurysm repair   . Cardiac catheterization   . Transurethral resection of prostate   . Cystectomy   . Tonsillectomy     [id number] of systems complete and found to be negative unless listed above PHYSICAL EXAM BP 121/73  Pulse 66  Ht 5\' 11"  (1.803 m)  Wt 271 lb (122.925 kg)  BMI 37.80 kg/m2  SpO2 91% General: Well developed, well nourished, in no acute distress Head: Eyes PERRLA,  + xanthomas.   Normal  cephalic and atramatic  Lungs: Clear bilaterally to auscultation and percussion with poor respiratory effort. Heart: HRRR S1 S2..  Pulses are 2+ & equal.            No carotid bruit. No JVD.  No abdominal bruits. No femoral bruits. Abdomen: Bowel sounds are positive, abdomen soft and non-tender without masses or                  Hernia's noted. Msk:  Back normal, normal gait. Normal strength and tone for age. Extremities: No clubbing, cyanosis or edema.  DP +1 Neuro: Alert and oriented X 3. Psych:  Good affect, responds appropriately   ASSESSMENT AND PLAN

## 2011-01-06 NOTE — Assessment & Plan Note (Signed)
He is doing fair at this time. Continues to have DOE and fatigue but no chest pain. He is not working with cardiac rehab and has been to one class. He will continue current medication regimen for now.  He will follow-up with Dr. Lattie Haw in one month.

## 2011-01-06 NOTE — Patient Instructions (Signed)
Your physician recommends that you schedule a follow-up appointment in: 2 months with Dr Gaynell Face have been referred to [facility] in [facility]

## 2011-01-06 NOTE — Assessment & Plan Note (Signed)
Currently well controlled on medications.  Should have follow-up BMET for kidney fx in one month, prior to seeing Dr. Lattie Haw.

## 2011-01-06 NOTE — Assessment & Plan Note (Signed)
Will check his BMET as he is on ACE inhibitor.

## 2011-01-08 ENCOUNTER — Encounter (HOSPITAL_COMMUNITY): Payer: Medicare Other

## 2011-01-10 ENCOUNTER — Encounter (HOSPITAL_COMMUNITY)
Admission: RE | Admit: 2011-01-10 | Discharge: 2011-01-10 | Payer: Medicare Other | Source: Ambulatory Visit | Attending: Cardiology | Admitting: Cardiology

## 2011-01-13 ENCOUNTER — Encounter (HOSPITAL_COMMUNITY): Payer: Medicare Other

## 2011-01-15 ENCOUNTER — Encounter (HOSPITAL_COMMUNITY): Payer: Medicare Other

## 2011-01-17 ENCOUNTER — Encounter (HOSPITAL_COMMUNITY): Payer: Medicare Other

## 2011-01-19 DIAGNOSIS — C3490 Malignant neoplasm of unspecified part of unspecified bronchus or lung: Secondary | ICD-10-CM

## 2011-01-19 HISTORY — DX: Malignant neoplasm of unspecified part of unspecified bronchus or lung: C34.90

## 2011-01-20 ENCOUNTER — Encounter (HOSPITAL_COMMUNITY): Payer: Medicare Other

## 2011-01-20 DIAGNOSIS — Z5189 Encounter for other specified aftercare: Secondary | ICD-10-CM | POA: Insufficient documentation

## 2011-01-20 DIAGNOSIS — I252 Old myocardial infarction: Secondary | ICD-10-CM | POA: Insufficient documentation

## 2011-01-20 DIAGNOSIS — I2510 Atherosclerotic heart disease of native coronary artery without angina pectoris: Secondary | ICD-10-CM | POA: Insufficient documentation

## 2011-01-22 ENCOUNTER — Emergency Department (HOSPITAL_COMMUNITY): Payer: Medicare Other

## 2011-01-22 ENCOUNTER — Encounter (HOSPITAL_COMMUNITY): Payer: Medicare Other

## 2011-01-22 ENCOUNTER — Emergency Department (HOSPITAL_COMMUNITY)
Admission: EM | Admit: 2011-01-22 | Discharge: 2011-01-22 | Disposition: A | Discharge status: Other Acute Inpatient Hospital | Payer: Medicare Other | Attending: Emergency Medicine | Admitting: Emergency Medicine

## 2011-01-22 ENCOUNTER — Encounter (HOSPITAL_COMMUNITY): Payer: Self-pay | Admitting: Radiology

## 2011-01-22 DIAGNOSIS — Q619 Cystic kidney disease, unspecified: Secondary | ICD-10-CM | POA: Insufficient documentation

## 2011-01-22 DIAGNOSIS — N289 Disorder of kidney and ureter, unspecified: Secondary | ICD-10-CM | POA: Insufficient documentation

## 2011-01-22 DIAGNOSIS — N1330 Unspecified hydronephrosis: Secondary | ICD-10-CM | POA: Insufficient documentation

## 2011-01-22 DIAGNOSIS — R911 Solitary pulmonary nodule: Secondary | ICD-10-CM | POA: Insufficient documentation

## 2011-01-22 DIAGNOSIS — R7309 Other abnormal glucose: Secondary | ICD-10-CM | POA: Insufficient documentation

## 2011-01-22 DIAGNOSIS — N23 Unspecified renal colic: Secondary | ICD-10-CM | POA: Insufficient documentation

## 2011-01-22 DIAGNOSIS — K8020 Calculus of gallbladder without cholecystitis without obstruction: Secondary | ICD-10-CM | POA: Insufficient documentation

## 2011-01-22 DIAGNOSIS — K5730 Diverticulosis of large intestine without perforation or abscess without bleeding: Secondary | ICD-10-CM | POA: Insufficient documentation

## 2011-01-22 DIAGNOSIS — Z79899 Other long term (current) drug therapy: Secondary | ICD-10-CM | POA: Insufficient documentation

## 2011-01-22 LAB — URINALYSIS, ROUTINE W REFLEX MICROSCOPIC
Glucose, UA: NEGATIVE mg/dL
Protein, ur: NEGATIVE mg/dL
Specific Gravity, Urine: 1.025 (ref 1.005–1.030)
pH: 5.5 (ref 5.0–8.0)

## 2011-01-22 LAB — COMPREHENSIVE METABOLIC PANEL
Albumin: 3.7 g/dL (ref 3.5–5.2)
Alkaline Phosphatase: 75 U/L (ref 39–117)
BUN: 41 mg/dL — ABNORMAL HIGH (ref 6–23)
CO2: 23 mEq/L (ref 19–32)
Chloride: 110 mEq/L (ref 96–112)
Creatinine, Ser: 2.3 mg/dL — ABNORMAL HIGH (ref 0.50–1.35)
GFR calc non Af Amer: 28 mL/min — ABNORMAL LOW (ref >60)
Potassium: 4.3 mEq/L (ref 3.5–5.1)
Total Bilirubin: 0.9 mg/dL (ref 0.3–1.2)

## 2011-01-22 LAB — CBC
HCT: 43.2 % (ref 39.0–52.0)
Hemoglobin: 14.1 g/dL (ref 13.0–17.0)
MCV: 95.6 fL (ref 78.0–100.0)
RBC: 4.52 MIL/uL (ref 4.22–5.81)
RDW: 15.7 % — ABNORMAL HIGH (ref 11.5–15.5)
WBC: 7.3 10*3/uL (ref 4.0–10.5)

## 2011-01-22 LAB — GLUCOSE, CAPILLARY: Glucose-Capillary: 153 mg/dL — ABNORMAL HIGH (ref 70–99)

## 2011-01-22 LAB — DIFFERENTIAL
Basophils Absolute: 0 10*3/uL (ref 0.0–0.1)
Eosinophils Relative: 6 % — ABNORMAL HIGH (ref 0–5)
Lymphocytes Relative: 35 % (ref 12–46)
Lymphs Abs: 2.5 10*3/uL (ref 0.7–4.0)
Neutro Abs: 3.7 10*3/uL (ref 1.7–7.7)
Neutrophils Relative %: 51 % (ref 43–77)

## 2011-01-22 LAB — URINE MICROSCOPIC-ADD ON

## 2011-01-23 NOTE — Discharge Summary (Signed)
Kyle [name], Kyle [name]                ACCOUNT NO.:  [id number]  MEDICAL RECORD NO.:  [id number]           PATIENT TYPE:  I  LOCATION:  2924                         FACILITY:  [place]  PHYSICIAN:  Shaune Pascal. Lotus Santillo, MDDATE OF BIRTH:  [birth year]  DATE OF ADMISSION:  12/19/2010 DATE OF DISCHARGE:  12/21/2010                              DISCHARGE SUMMARY   PRIMARY CARDIOLOGIST:  [place], [facility], to establish.  DISCHARGE DIAGNOSIS: 1. Coronary artery disease.     a.     Status post non-ST-segment elevation myocardial infarction,      secondary to distal right coronary artery/posterolateral lesion,      treated medically. b  Ejection fraction 55-60%, by 2-D echocardiography.  SECONDARY DIAGNOSES: 1. Hypertension.     a.     Severe left ventricular hypertrophy, by 2-D echo, this      admission. 2. Chronic kidney disease. 3. Thrombocytopenia. 4. Peripheral arterial disease.     a.     Status post abdominal aortic aneurysm repair. 5. Type 2 diabetes mellitus.     a.     Diet controlled. 6. Chronic obstructive pulmonary disease.     a.     Ongoing tobacco.  REASON FOR ADMISSION:  Kyle [name] is a 77 year old male, with history of CAD status post remote coronary angiography, who presented with clinical  evidence of non-ST-segment elevation myocardial infarction.  HOSPITAL COURSE:  The patient apparently initially presented to [facility], and was admitted to [facility]. Following development of abnormal cardiac markers, however, recommendation was to transfer to [facility] for further management, and plans to proceed with coronary angiography.  Following transfer, cardiac markers were cycled and notable for a peak troponin of 4.4 on admission, with subsequent downward trending.  The patient was cleared to proceed with cardiac catheterization the following day, performed by Dr. Haroldine Laws (see report for complete details), notable for  subtotal occlusion of a distal RCA/posterolateral branch.  Following review of films with Dr. Lia Foyer, it was felt that the lesion was too small and, thus, not amenable to PCI.  It was also  felt that a moderately severe stenosis in the proximal CFX was most likely chronic, and clearly not critical.  There was suggestion for PCI of this CFX lesion, however, if the patient failed medical therapy.  A 2-D echo was performed, yielding normal LVEF (EF 55-60%), with severe LVH, and mild MR.  The patient was kept for overnight observation and cleared for discharge the following morning, in hemodynamically stable condition.  Of note, he  did develop hypoxemia (SaO2 82%) with exertion, and recommendation was to place him on continuous home oxygen.  He was also strongly counseled to stop smoking tobacco.  The patient was not on an oral hypoglycemic prior to admission, with prior treatment of his diabetes mellitus with diet.  There was suggestion of considering adding metformin, with decision to be deferred to his primary care physician.  DISCHARGE LABORATORY DATA:  Sodium 143, potassium 4.0, BUN 16, creatinine 1.4, glucose 145.  WBC 7.1, hemoglobin 15.2, hematocrit 45, and platelet 95.  OUTSTANDING LABORATORY DATA:  Peak CPK 205/16 (7.6%), troponin I 4.4, on admission. TSH 3.12.  Admission Chest x-ray: mild basilar atelectasis, otherwise clear.  DISPOSITION:  Stable.  FOLLOWUP: 1. Jory Sims, FNP, [place], [facility], in 2     weeks.  Arrangements to be made through our office. 2. Dr. Wolfgang Phoenix in 3-4 weeks.  DISCHARGE MEDICATIONS: 1. Plavix 75 mg daily. 2. Aspirin 81 mg daily. 3. Metoprolol 50 mg b.i.d. 4. Allegra 180 mg daily. 5. Amlodipine 5 mg daily. 6. Doxycycline 50 mg b.i.d. 7. Enalapril 20 mg b.i.d. 8. Fluticasone nasal spray as directed. 9. Iron 65 mg daily. 10.Niaspan 1000 mg nightly. 11.Pravachol 80 mg nightly. 12.Nitrostat 0.4 mg  p.r.n.  DURATION OF DISCHARGE ENCOUNTER:  Greater than 30 minutes, including physician time.     Gene Serpe, PA-C   ______________________________ Shaune Pascal. Vint Pola, MD    GS/MEDQ  D:  12/21/2010  T:  12/22/2010  Job:  [id number]  cc:   Margaretmary Eddy, M.D.  Electronically Signed by Mannie Stabile PA-C on 12/24/2010 11:57:12 AM Electronically Signed by Glori Bickers MD on 01/23/2011 03:19:43 PM

## 2011-01-23 NOTE — Cardiovascular Report (Signed)
Kyle [name], Kyle [name]                ACCOUNT NO.:  [id number]  MEDICAL RECORD NO.:  [id number]           PATIENT TYPE:  I  LOCATION:  2924                         FACILITY:  [place]  PHYSICIAN:  Shaune Pascal. Beauford Lando, MDDATE OF BIRTH:  [birth year]  DATE OF PROCEDURE:  12/20/2010 DATE OF DISCHARGE:                           CARDIAC CATHETERIZATION   PRIMARY CARE PHYSICIAN:  W. Rosemary Holms, MD.  PATIENT IDENTIFICATION:  Kyle [name] is a 77 year old male with a history of COPD and ongoing tobacco use, also has a history of coronary artery disease and is status post cardiac catheterization at least 20 years ago.  He was told at that time he had something wrong with the blood vessel on the back of his heart but did not remember anymore.  He was admitted with a non-ST-elevation myocardial infarction.  He was referred for cardiac catheterization.  Initially his creatinine was 1.8 on bolus hydration.  It came down to 1.4.  PROCEDURES PERFORMED:1. Selective coronary angiography. 2. Left heart cath.  DESCRIPTION OF PROCEDURE:  The risks and indications were explained. Consent was signed and placed on the chart.  A 5-French arterial sheath was placed in the right femoral artery using modified Seldinger technique.  We used a JL-4 to image the left coronary system, a JR-4 to image the right coronary artery, and an AR-1 to image the anomalous left circ.  There were no apparent complications.  Central aortic pressure was 148/82 with a mean of 109.  LV pressure 156/16 with EDP of 29. There was no aortic stenosis.  Total contrast used was 95 mL.  Left main had an ostial 20% lesion.  It gave off a ramus and LAD.  The LAD was a moderate-sized vessel, gave off single diagonal.  The mid-to- distal LAD was diffusely diseased with about 50% stenosis.  In the proximal portion of the first diagonal, there was 70% stenosis.  Right coronary artery was a very large dominant vessel.  It was aneurysmal  throughout most of the mid and distal section.  There were serial 50% lesions around the mid to distal bend.  There was mild plaquing in the PDA.  The first posterolateral appeared to be totally occluded with some late filling from right to right collaterals.  In the distal RCA ostium of the second posterolateral, there was a 99% stenosis.  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  We were able to get this with an AR-1.  There was a long diffuse 70-80% lesion around the proximal bends.  It is a fairly tortuous artery.  ASSESSMENT: 1. Three-vessel coronary artery disease as described above. 2. The culprit lesion appears to be the distal right coronary artery     and posterolateral system. 3. 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  PLAN/DISCUSSION:  I reviewed the films with Dr. Lia Foyer.  We both feel that the culprit lesion is the distal RCA/PL system which is small and not amenable to percutaneous intervention.  We will treat that area medically.  He does have  a moderate to severe stenosis in the proximal portion of the left circumflex.  I suspect this may be chronic.  It does not appear high critical at this point.  At this point, we will attempt to treat him medically.  If that fails, we will consider possible percutaneous intervention on the left circumflex system.  We will hydrate him post cath and also give him a little Lasix for his elevated EDP.     Shaune Pascal. Mikel Hardgrove, MD     DRB/MEDQ  D:  12/20/2010  T:  12/21/2010  Job:  [id number]  cc:   Margaretmary Eddy, M.D.  Electronically Signed by Glori Bickers MD on 01/23/2011 03:19:36 PM

## 2011-01-24 ENCOUNTER — Institutional Professional Consult (permissible substitution): Payer: Medicare Other | Admitting: Pulmonary Disease

## 2011-01-24 ENCOUNTER — Encounter (HOSPITAL_COMMUNITY): Admission: RE | Admit: 2011-01-24 | Payer: Medicare Other | Source: Ambulatory Visit

## 2011-01-27 ENCOUNTER — Encounter (HOSPITAL_COMMUNITY): Payer: Medicare Other

## 2011-01-29 ENCOUNTER — Encounter (HOSPITAL_COMMUNITY): Payer: Medicare Other

## 2011-01-31 ENCOUNTER — Encounter (HOSPITAL_COMMUNITY): Payer: Medicare Other

## 2011-02-03 ENCOUNTER — Encounter (HOSPITAL_COMMUNITY)
Admission: RE | Admit: 2011-02-03 | Discharge: 2011-02-03 | Disposition: A | Discharge status: Home / Self Care | Payer: Medicare Other | Source: Ambulatory Visit | Attending: Cardiology | Admitting: Cardiology

## 2011-02-05 ENCOUNTER — Encounter (HOSPITAL_COMMUNITY)
Admission: RE | Admit: 2011-02-05 | Discharge: 2011-02-05 | Disposition: A | Discharge status: Home / Self Care | Payer: Medicare Other | Source: Ambulatory Visit | Attending: Cardiology | Admitting: Cardiology

## 2011-02-06 NOTE — H&P (Signed)
[name], [name] NO.:  [id number]  MEDICAL RECORD NO.:  [id number]           PATIENT TYPE:  I  LOCATION:  2924                         FACILITY:  [place]  PHYSICIAN:  Loralie Champagne, MD      DATE OF BIRTH:  [birth year]  DATE OF ADMISSION:  12/19/2010 DATE OF DISCHARGE:                             HISTORY & PHYSICAL   PRIMARY CARE PHYSICIAN:  Scott A. Wolfgang Phoenix, MD  PRIMARY CARDIOLOGIST:  Vanna Scotland Olevia Perches, MD, [employer], greater than 20 years ago.  CHIEF COMPLAINT:  Chest pain, non ST-segment elevation MI.  HISTORY OF PRESENT ILLNESS:  [name] is a 77 year old male with a history of coronary artery disease.  Over the last couple of weeks, he has had several episodes of chest pain that is not clearly exertional. It reminded him of his previous MI pain but was not that bad.  Each episode would resolve without intervention.  He did not seek medical care.  About 2 a.m., he was wakened by substernal chest pain, 5/10.  It felt like his previous MI pain.  It waxed and waned but did not resolve. He walked around and drank coffee.  He took aspirin 81 mg totaling three.  He did not have nitroglycerin.  When his symptoms did not resolve, he went to [facility] about 5:45.  There, he received one more aspirin 81 mg, 1 inch nitroglycerin paste, Protonix IV, Lovenox, and Lopressor.  He was initially admitted and evaluated by [facility].  However, when his enzymes continue to elevate, he was transferred to [facility].  He feels that his pain lasted for approximately 6 hours today but he is currently pain free.  PAST MEDICAL HISTORY: 1. Status post MI with cardiac catheterization showing possible     blockage but no further details available, greater than 20 years     ago. 2. Obesity. 3. Diet-controlled diabetes, last hemoglobin A1c 6.3. 4. Hyperlipidemia. 5. Ongoing tobacco abuse. 6. Rosacea. 7. History of AAA. 8. OSA on CPAP. 9. Bladder  cancer. 10.BPH. 11.Seasonal allergies. 12.History of insomnia. 13.Chronic kidney disease, followed by nephrologist at [facility].  SURGICAL HISTORY:  He is status post cardiac catheterization as well as AAA repair, colonoscopy x3, TURP, and surgical treatment for bladder cancer described by the patient as a "scraping."  ALLERGIES:  No known drug allergies.  CURRENT MEDICATIONS: 1. Alprazolam 0.5 mg one-half tablet daily p.r.n. 2. Aspirin 81 mg a day. 3. Doxycycline 50 mg b.i.d. 4. Vitamin D2 50,000 units a month. 5. Fluticasone nasal spray daily. 6. Allegra 180 mg a day. 7. Pravachol 80 mg a day. 8. Niaspan 500 mg 2 tablets at bedtime. 9. Lopressor 50 mg 1-1/2 tablets b.i.d. 10.Enalapril 20 mg b.i.d. 11.Amlodipine 5 mg a day. 12.Iron daily. 13.Benzedrex nasal inhaler p.r.n. 14.Extra Strength Tylenol p.r.n.  SOCIAL HISTORY:  He lives in [place], [place], with his wife. He is retired from [employer].  He has approximately 50 pack-year history of tobacco use but denies alcohol or drug abuse.  He does not exercise. FAMILY HISTORY:  His mother died at 47 with no cardiac issues and  his father died at 56 after AAA but neither father nor any siblings have any cardiac issues.  REVIEW OF SYSTEMS:  He has chronic arthralgias.  He has occasional lower extremity edema.  He has had the chest pain described above.  He has chronic dyspnea on exertion that is not changed recently.  He has had sinus problems and headaches recently.  He has not had fevers or chills, and there has been no purulent nasal drainage.  Full 14-point review of systems is otherwise negative except as stated in the HPI.  PHYSICAL EXAMINATION:  VITAL SIGNS:  Temperature 97.9, blood pressure 110/74, heart rate 60, respiratory rate 16, O2 saturation 95% on O2. GENERAL:  He is a well-developed obese white male in no acute distress. HEENT:  Normal with the exception of some possible mild exophthalmos. NECK:   There is no lymphadenopathy, thyromegaly, bruit, or JVD noted. CVA:  His heart is regular in rate and rhythm with an S1 and S2 and no clinically significant murmur, rub, or gallop is noted.  Distal pulses are intact in all four extremities. LUNGS:  He has some dry rales but are generally clear. SKIN:  He has chronic stasis changes on both lower extremities and several areas of ecchymosis but no rashes or significant lesions are noted. ABDOMEN:  He has a large midline postoperative abdominal hernia near the umbilicus and a smaller hernia proximal to that.  Bowel sounds are present, and the abdomen is soft and nontender. EXTREMITIES:  There is no cyanosis, clubbing, or edema noted. MUSCULOSKELETAL:  There is no joint deformity or effusions, and no spine or CVA tenderness. NEUROLOGIC:  He is alert and oriented.  Cranial nerves II through XII grossly intact.  EKG sinus bradycardia, rate 57, with an incomplete right bundle-branch block and nonspecific ST and T-wave flattening.  There is currently no old EKG available for comparison.  Chest x-ray shows mild basilar atelectasis noted with lungs otherwise clear and mediastinal silhouette normal in size.  LABORATORY VALUES:  Hemoglobin 16.3, hematocrit 49.3, WBC 7.6, platelets 109.  Sodium 138, potassium 3.9, chloride 103, CO2 25, BUN 23, creatinine 1.7, glucose 242.  CK-MB 109/4.2, then 187/14.7 with a troponin-I of 0.3, then 2.14.  BNP 201.4.  IMPRESSION:  [name] was seen today by Dr. Aundra Dubin, the patient evaluated and the data reviewed.  He is a 77 year old male with a history of diabetes, obstructive sleep apnea, chronic kidney disease, and abdominal aortic aneurysm repair as well as an myocardial infarction approximately 25 years ago who was admitted to [facility] with chest pain and found to have a non-ST-segment elevation myocardial infarction. 1. Coronary artery disease:  He has a non-STEMI with elevated cardiac     enzymes and  lateral EKG changes.  Currently, he is chest pain free.     a.     Left heart cath in a.m.     b.     Continue nitroglycerin paste.     c.     Lopressor 25 mg p.o. q.6 h.     d.     Heparin drip per Pharmacy (transitioned from Lovenox).     e.     Plavix load 600 mg and 75 mg daily.     f.     Aspirin.     g.     Crestor 40. 2. Chronic kidney disease:  His creatinine currently is 1.7 was with a     GFR of 40.  We suspect this is at  baseline.  He will be gently     hydrated at 50 mL an hour overnight because he had a very mild     elevation in his BNP.  We will use normal saline for hydration.  We     will check an echocardiogram and we will not do an left     ventriculogram at cath. 3. Thrombocytopenia:  It is mild and may be chronic.  We will follow. 4. Diabetes:  We will put him on sliding scale insulin since he has     elevated glucose levels in the setting of an MI.  He is not     currently on home meds.  We will check hemoglobin A1c.  He will be     continued on his other home medications.     Rosaria Ferries, PA-C   ______________________________ Loralie Champagne, MD    RB/MEDQ  D:  12/19/2010  T:  12/20/2010  Job:  [id number]  Electronically Signed by Rosaria Ferries PA-C on 01/17/2011 10:52:20 AM Electronically Signed by Loralie Champagne MD on 02/06/2011 01:34:13 PM

## 2011-02-07 ENCOUNTER — Other Ambulatory Visit: Payer: Self-pay | Admitting: Family Medicine

## 2011-02-07 ENCOUNTER — Encounter (HOSPITAL_COMMUNITY)
Admission: RE | Admit: 2011-02-07 | Discharge: 2011-02-07 | Disposition: A | Discharge status: Home / Self Care | Payer: Medicare Other | Source: Ambulatory Visit | Attending: Cardiology | Admitting: Cardiology

## 2011-02-07 DIAGNOSIS — R911 Solitary pulmonary nodule: Secondary | ICD-10-CM

## 2011-02-10 ENCOUNTER — Encounter (HOSPITAL_COMMUNITY)
Admission: RE | Admit: 2011-02-10 | Discharge: 2011-02-10 | Disposition: A | Discharge status: Home / Self Care | Payer: Medicare Other | Source: Ambulatory Visit | Attending: Cardiology | Admitting: Cardiology

## 2011-02-12 ENCOUNTER — Encounter (HOSPITAL_COMMUNITY)
Admission: RE | Admit: 2011-02-12 | Discharge: 2011-02-12 | Disposition: A | Discharge status: Home / Self Care | Payer: Medicare Other | Source: Ambulatory Visit | Attending: Cardiology | Admitting: Cardiology

## 2011-02-14 ENCOUNTER — Encounter (HOSPITAL_COMMUNITY)
Admission: RE | Admit: 2011-02-14 | Discharge: 2011-02-14 | Disposition: A | Discharge status: Home / Self Care | Payer: Medicare Other | Source: Ambulatory Visit | Attending: Cardiology | Admitting: Cardiology

## 2011-02-17 ENCOUNTER — Encounter (HOSPITAL_COMMUNITY): Payer: Self-pay

## 2011-02-17 ENCOUNTER — Encounter (HOSPITAL_COMMUNITY): Payer: Medicare Other

## 2011-02-17 ENCOUNTER — Encounter (HOSPITAL_COMMUNITY)
Admission: RE | Admit: 2011-02-17 | Discharge: 2011-02-17 | Disposition: A | Discharge status: Home / Self Care | Payer: Medicare Other | Source: Ambulatory Visit | Attending: Family Medicine | Admitting: Family Medicine

## 2011-02-17 DIAGNOSIS — R911 Solitary pulmonary nodule: Secondary | ICD-10-CM

## 2011-02-17 DIAGNOSIS — I719 Aortic aneurysm of unspecified site, without rupture: Secondary | ICD-10-CM | POA: Insufficient documentation

## 2011-02-17 DIAGNOSIS — D3500 Benign neoplasm of unspecified adrenal gland: Secondary | ICD-10-CM | POA: Insufficient documentation

## 2011-02-17 DIAGNOSIS — J449 Chronic obstructive pulmonary disease, unspecified: Secondary | ICD-10-CM | POA: Insufficient documentation

## 2011-02-17 DIAGNOSIS — I7789 Other specified disorders of arteries and arterioles: Secondary | ICD-10-CM | POA: Insufficient documentation

## 2011-02-17 DIAGNOSIS — I714 Abdominal aortic aneurysm, without rupture, unspecified: Secondary | ICD-10-CM | POA: Insufficient documentation

## 2011-02-17 DIAGNOSIS — Z8551 Personal history of malignant neoplasm of bladder: Secondary | ICD-10-CM | POA: Insufficient documentation

## 2011-02-17 DIAGNOSIS — N289 Disorder of kidney and ureter, unspecified: Secondary | ICD-10-CM | POA: Insufficient documentation

## 2011-02-17 DIAGNOSIS — I2510 Atherosclerotic heart disease of native coronary artery without angina pectoris: Secondary | ICD-10-CM | POA: Insufficient documentation

## 2011-02-17 DIAGNOSIS — J984 Other disorders of lung: Secondary | ICD-10-CM | POA: Insufficient documentation

## 2011-02-17 DIAGNOSIS — J4489 Other specified chronic obstructive pulmonary disease: Secondary | ICD-10-CM | POA: Insufficient documentation

## 2011-02-17 DIAGNOSIS — N269 Renal sclerosis, unspecified: Secondary | ICD-10-CM | POA: Insufficient documentation

## 2011-02-17 HISTORY — DX: Essential (primary) hypertension: I10

## 2011-02-17 LAB — GLUCOSE, CAPILLARY: Glucose-Capillary: 132 mg/dL — ABNORMAL HIGH (ref 70–99)

## 2011-02-17 MED ORDER — FLUDEOXYGLUCOSE F - 18 (FDG) INJECTION
17.2000 | Freq: Once | INTRAVENOUS | Status: AC | PRN
  Administered 2011-02-17: 17.2 via INTRAVENOUS

## 2011-02-19 ENCOUNTER — Encounter: Payer: Self-pay | Admitting: Pulmonary Disease

## 2011-02-19 ENCOUNTER — Encounter (HOSPITAL_COMMUNITY)
Admission: RE | Admit: 2011-02-19 | Discharge: 2011-02-19 | Disposition: A | Discharge status: Home / Self Care | Payer: Medicare Other | Source: Ambulatory Visit | Attending: Cardiology | Admitting: Cardiology

## 2011-02-19 DIAGNOSIS — I2510 Atherosclerotic heart disease of native coronary artery without angina pectoris: Secondary | ICD-10-CM | POA: Insufficient documentation

## 2011-02-19 DIAGNOSIS — Z5189 Encounter for other specified aftercare: Secondary | ICD-10-CM | POA: Insufficient documentation

## 2011-02-19 DIAGNOSIS — I252 Old myocardial infarction: Secondary | ICD-10-CM | POA: Insufficient documentation

## 2011-02-21 ENCOUNTER — Encounter (HOSPITAL_COMMUNITY)
Admission: RE | Admit: 2011-02-21 | Discharge: 2011-02-21 | Disposition: A | Discharge status: Home / Self Care | Payer: Medicare Other | Source: Ambulatory Visit | Attending: Cardiology | Admitting: Cardiology

## 2011-02-24 ENCOUNTER — Encounter (HOSPITAL_COMMUNITY)
Admission: RE | Admit: 2011-02-24 | Discharge: 2011-02-24 | Disposition: A | Discharge status: Home / Self Care | Payer: Medicare Other | Source: Ambulatory Visit | Attending: Cardiology | Admitting: Cardiology

## 2011-02-24 ENCOUNTER — Encounter: Payer: Self-pay | Admitting: Pulmonary Disease

## 2011-02-24 ENCOUNTER — Ambulatory Visit (INDEPENDENT_AMBULATORY_CARE_PROVIDER_SITE_OTHER): Payer: Medicare Other | Admitting: Pulmonary Disease

## 2011-02-24 DIAGNOSIS — R918 Other nonspecific abnormal finding of lung field: Secondary | ICD-10-CM

## 2011-02-24 DIAGNOSIS — C3491 Malignant neoplasm of unspecified part of right bronchus or lung: Secondary | ICD-10-CM | POA: Insufficient documentation

## 2011-02-24 DIAGNOSIS — J209 Acute bronchitis, unspecified: Secondary | ICD-10-CM | POA: Insufficient documentation

## 2011-02-24 DIAGNOSIS — R0989 Other specified symptoms and signs involving the circulatory and respiratory systems: Secondary | ICD-10-CM

## 2011-02-24 DIAGNOSIS — R222 Localized swelling, mass and lump, trunk: Secondary | ICD-10-CM

## 2011-02-24 DIAGNOSIS — R0609 Other forms of dyspnea: Secondary | ICD-10-CM

## 2011-02-24 HISTORY — DX: Malignant neoplasm of unspecified part of right bronchus or lung: C34.91

## 2011-02-24 NOTE — Patient Instructions (Signed)
Will schedule for full pfts here or at [facility] within the next week. Will call you with results.  Will speak with Dr. Lattie Haw about whether we can stop your aspirin and plavix to do lung biopsy.

## 2011-02-24 NOTE — Assessment & Plan Note (Addendum)
The pt has doe and a long h/o tobacco abuse.  It is unclear whether he has copd or not, and will need pfts for evaluation.  His morbid obesity and deconditioning also play significant roles here.

## 2011-02-24 NOTE — Progress Notes (Signed)
  Subjective:    Patient ID: Kyle [name], male    DOB: 1933/11/15, 77 y.o.   MRN: [id number]  HPI The pt is a 77y/o male who I have been asked to see for COPD and an abnormal CT chest/PET.  He has a longstanding smoking history, along with extensive cardiovascular disease.  He did quit smoking in may of this year.  He has never had pfts.  The pt is currently in cardiac rehab, and tells me he does 4/10 of mild on treadmill followed by 8/10 mild on bike.  He is unsure how many blocks he can walk, but will get winded with one flight of stairs.  He admits his exertional tolerance is fairly poor.  His cough has almost totally resolved since quitting smoking.  He denies chronic chest congestion.  He recently went to ER for kidney stone, and was found incidentally to have a 1.8cm RLL mass.  He underwent PET scan which showed hypermetabolic activity, but none significantly anywhere else.     Review of Systems  Constitutional: Negative for fever and unexpected weight change.  HENT: Positive for congestion. Negative for ear pain, nosebleeds, sore throat, rhinorrhea, sneezing, trouble swallowing, dental problem, postnasal drip and sinus pressure.   Eyes: Negative for redness and itching.  Respiratory: Positive for cough, shortness of breath and wheezing. Negative for chest tightness.   Cardiovascular: Negative for palpitations and leg swelling.  Gastrointestinal: Negative for nausea and vomiting.  Genitourinary: Negative for dysuria.  Musculoskeletal: Negative for joint swelling.  Skin: Negative for rash.  Neurological: Negative for headaches.  Hematological: Does not bruise/bleed easily.  Psychiatric/Behavioral: Negative for dysphoric mood. The patient is not nervous/anxious.        Objective:   Physical Exam Constitutional:  Well developed, no acute distress  HENT:  Nares patent without discharge  Oropharynx without exudate, palate and uvula are thick and elongated.  Eyes:  Perrla, eomi, no  scleral icterus  Neck:  No JVD, no TMG  Cardiovascular:  Normal rate, regular rhythm, no rubs or gallops. 2/6 sem        Intact distal pulses but decreased  Pulmonary :  Normal breath sounds, no stridor or respiratory distress   No rales, rhonchi, or wheezing  Abdominal:  Soft, nondistended, bowel sounds present.  No tenderness noted.   Musculoskeletal:  mild lower extremity edema noted.  Lymph Nodes:  No cervical lymphadenopathy noted  Skin:  No cyanosis noted  Neurologic:  Alert, appropriate, moves all 4 extremities without obvious deficit.         Assessment & Plan:

## 2011-02-25 ENCOUNTER — Ambulatory Visit (INDEPENDENT_AMBULATORY_CARE_PROVIDER_SITE_OTHER): Payer: Medicare Other | Admitting: Pulmonary Disease

## 2011-02-25 ENCOUNTER — Telehealth: Payer: Self-pay | Admitting: Pulmonary Disease

## 2011-02-25 ENCOUNTER — Other Ambulatory Visit: Payer: Self-pay | Admitting: Pulmonary Disease

## 2011-02-25 DIAGNOSIS — R918 Other nonspecific abnormal finding of lung field: Secondary | ICD-10-CM

## 2011-02-25 DIAGNOSIS — R0989 Other specified symptoms and signs involving the circulatory and respiratory systems: Secondary | ICD-10-CM

## 2011-02-25 DIAGNOSIS — J449 Chronic obstructive pulmonary disease, unspecified: Secondary | ICD-10-CM

## 2011-02-25 LAB — PULMONARY FUNCTION TEST

## 2011-02-25 NOTE — Telephone Encounter (Signed)
Please let pt know that Dr. Lattie Haw gave ok to stop his aspirin and plavix for lung biopsy. Will go ahead with order to pcc to schedule procedure.  Let pt know someone will contact him with directions.

## 2011-02-25 NOTE — Progress Notes (Signed)
PFT done today. 

## 2011-02-25 NOTE — Telephone Encounter (Signed)
lmomtcb x1 

## 2011-02-26 ENCOUNTER — Encounter (HOSPITAL_COMMUNITY)
Admission: RE | Admit: 2011-02-26 | Discharge: 2011-02-26 | Disposition: A | Discharge status: Home / Self Care | Payer: Medicare Other | Source: Ambulatory Visit | Attending: Cardiology | Admitting: Cardiology

## 2011-02-26 ENCOUNTER — Telehealth: Payer: Self-pay | Admitting: Pulmonary Disease

## 2011-02-26 NOTE — Telephone Encounter (Signed)
Pt returned call. I called and had to [id number]. Inyo Bing, CMA

## 2011-02-26 NOTE — Telephone Encounter (Signed)
Pt informed of KC's recs and Pt verbalized understanding. Is scheduled for CT Biopsy 03-06-11.

## 2011-02-26 NOTE — Telephone Encounter (Signed)
Spoke with Abigail Butts at Dr [name] office-states they need OV notes from 02-24-11.She is aware that CT Biopsy is scheduled for 03-06-11 and PFT done on 02-25-11(waiting for [facility] to review results). I have faxed the needed information.

## 2011-02-26 NOTE — Telephone Encounter (Signed)
Duplicate message. Kyle [name], CMA  

## 2011-02-28 ENCOUNTER — Encounter (HOSPITAL_COMMUNITY)
Admission: RE | Admit: 2011-02-28 | Discharge: 2011-02-28 | Disposition: A | Discharge status: Home / Self Care | Payer: Medicare Other | Source: Ambulatory Visit | Attending: Cardiology | Admitting: Cardiology

## 2011-02-28 ENCOUNTER — Telehealth: Payer: Self-pay | Admitting: Pulmonary Disease

## 2011-02-28 ENCOUNTER — Encounter: Payer: Self-pay | Admitting: Pulmonary Disease

## 2011-02-28 NOTE — Assessment & Plan Note (Signed)
The pt has a 1.8cm mass in RLL most c/w bronchogenic cancer.  Since it is very peripheral, TTNA under ct guidance is best approach.  Will need clearance from cardiology to stop his ASA and plavix to do the biopsy.

## 2011-02-28 NOTE — Telephone Encounter (Signed)
Please let pt know that his breathing tests do show moderate emphysema.  Will get him back in after he has lung biopsy, and will discuss biopsy results and talk about meds to try and help his breathing.

## 2011-03-03 ENCOUNTER — Encounter (HOSPITAL_COMMUNITY)
Admission: RE | Admit: 2011-03-03 | Discharge: 2011-03-03 | Disposition: A | Discharge status: Home / Self Care | Payer: Medicare Other | Source: Ambulatory Visit | Attending: Cardiology | Admitting: Cardiology

## 2011-03-04 NOTE — Telephone Encounter (Signed)
Called and spoke with pt. Pt are of PFT results and KC's recs.  Pt verbalized understanding and denied any questions.

## 2011-03-05 ENCOUNTER — Encounter: Payer: Self-pay | Admitting: Pulmonary Disease

## 2011-03-05 ENCOUNTER — Encounter (HOSPITAL_COMMUNITY)
Admission: RE | Admit: 2011-03-05 | Discharge: 2011-03-05 | Disposition: A | Discharge status: Home / Self Care | Payer: Medicare Other | Source: Ambulatory Visit | Attending: Cardiology | Admitting: Cardiology

## 2011-03-06 ENCOUNTER — Other Ambulatory Visit: Payer: Self-pay | Admitting: Pulmonary Disease

## 2011-03-06 ENCOUNTER — Inpatient Hospital Stay (HOSPITAL_COMMUNITY)
Admission: RE | Admit: 2011-03-06 | Discharge: 2011-03-07 | DRG: 204 | Disposition: A | Discharge status: Home / Self Care | Payer: Medicare Other | Source: Ambulatory Visit | Attending: Pulmonary Disease | Admitting: Pulmonary Disease

## 2011-03-06 ENCOUNTER — Other Ambulatory Visit: Payer: Self-pay | Admitting: *Deleted

## 2011-03-06 ENCOUNTER — Inpatient Hospital Stay (HOSPITAL_COMMUNITY): Payer: Medicare Other

## 2011-03-06 ENCOUNTER — Ambulatory Visit (HOSPITAL_COMMUNITY): Payer: Medicare Other

## 2011-03-06 ENCOUNTER — Other Ambulatory Visit: Payer: Self-pay | Admitting: Interventional Radiology

## 2011-03-06 DIAGNOSIS — J4489 Other specified chronic obstructive pulmonary disease: Secondary | ICD-10-CM | POA: Diagnosis present

## 2011-03-06 DIAGNOSIS — Z7902 Long term (current) use of antithrombotics/antiplatelets: Secondary | ICD-10-CM

## 2011-03-06 DIAGNOSIS — IMO0002 Reserved for concepts with insufficient information to code with codable children: Secondary | ICD-10-CM | POA: Diagnosis present

## 2011-03-06 DIAGNOSIS — R222 Localized swelling, mass and lump, trunk: Secondary | ICD-10-CM

## 2011-03-06 DIAGNOSIS — R042 Hemoptysis: Secondary | ICD-10-CM

## 2011-03-06 DIAGNOSIS — G4733 Obstructive sleep apnea (adult) (pediatric): Secondary | ICD-10-CM | POA: Diagnosis present

## 2011-03-06 DIAGNOSIS — Z7982 Long term (current) use of aspirin: Secondary | ICD-10-CM

## 2011-03-06 DIAGNOSIS — I252 Old myocardial infarction: Secondary | ICD-10-CM

## 2011-03-06 DIAGNOSIS — N400 Enlarged prostate without lower urinary tract symptoms: Secondary | ICD-10-CM | POA: Diagnosis present

## 2011-03-06 DIAGNOSIS — N189 Chronic kidney disease, unspecified: Secondary | ICD-10-CM | POA: Diagnosis present

## 2011-03-06 DIAGNOSIS — I2510 Atherosclerotic heart disease of native coronary artery without angina pectoris: Secondary | ICD-10-CM | POA: Diagnosis present

## 2011-03-06 DIAGNOSIS — N200 Calculus of kidney: Secondary | ICD-10-CM | POA: Diagnosis present

## 2011-03-06 DIAGNOSIS — E785 Hyperlipidemia, unspecified: Secondary | ICD-10-CM | POA: Diagnosis present

## 2011-03-06 DIAGNOSIS — E119 Type 2 diabetes mellitus without complications: Secondary | ICD-10-CM | POA: Diagnosis present

## 2011-03-06 DIAGNOSIS — Z79899 Other long term (current) drug therapy: Secondary | ICD-10-CM

## 2011-03-06 DIAGNOSIS — Z8551 Personal history of malignant neoplasm of bladder: Secondary | ICD-10-CM

## 2011-03-06 DIAGNOSIS — L719 Rosacea, unspecified: Secondary | ICD-10-CM | POA: Diagnosis present

## 2011-03-06 DIAGNOSIS — F17200 Nicotine dependence, unspecified, uncomplicated: Secondary | ICD-10-CM | POA: Diagnosis present

## 2011-03-06 DIAGNOSIS — R918 Other nonspecific abnormal finding of lung field: Secondary | ICD-10-CM

## 2011-03-06 DIAGNOSIS — I714 Abdominal aortic aneurysm, without rupture, unspecified: Secondary | ICD-10-CM | POA: Diagnosis present

## 2011-03-06 DIAGNOSIS — J309 Allergic rhinitis, unspecified: Secondary | ICD-10-CM | POA: Diagnosis present

## 2011-03-06 DIAGNOSIS — J449 Chronic obstructive pulmonary disease, unspecified: Secondary | ICD-10-CM | POA: Diagnosis present

## 2011-03-06 DIAGNOSIS — Y849 Medical procedure, unspecified as the cause of abnormal reaction of the patient, or of later complication, without mention of misadventure at the time of the procedure: Secondary | ICD-10-CM | POA: Diagnosis present

## 2011-03-06 DIAGNOSIS — I129 Hypertensive chronic kidney disease with stage 1 through stage 4 chronic kidney disease, or unspecified chronic kidney disease: Secondary | ICD-10-CM | POA: Diagnosis present

## 2011-03-06 LAB — MAGNESIUM: Magnesium: 2.2 mg/dL (ref 1.5–2.5)

## 2011-03-06 LAB — CBC
HCT: 43.6 % (ref 39.0–52.0)
Hemoglobin: 13.5 g/dL (ref 13.0–17.0)
Hemoglobin: 14.6 g/dL (ref 13.0–17.0)
MCH: 31.3 pg (ref 26.0–34.0)
MCHC: 33.5 g/dL (ref 30.0–36.0)
MCV: 93 fL (ref 78.0–100.0)
MCV: 93.6 fL (ref 78.0–100.0)
Platelets: 114 10*3/uL — ABNORMAL LOW (ref 150–400)
RBC: 4.31 MIL/uL (ref 4.22–5.81)
RDW: 15.2 % (ref 11.5–15.5)
WBC: 5.3 10*3/uL (ref 4.0–10.5)

## 2011-03-06 LAB — APTT
aPTT: 36 seconds (ref 24–37)
aPTT: 33 seconds (ref 24–37)

## 2011-03-06 LAB — BASIC METABOLIC PANEL
BUN: 25 mg/dL — ABNORMAL HIGH (ref 6–23)
CO2: 26 mEq/L (ref 19–32)
Calcium: 9.5 mg/dL (ref 8.4–10.5)
Creatinine, Ser: 1.52 mg/dL — ABNORMAL HIGH (ref 0.50–1.35)
Glucose, Bld: 110 mg/dL — ABNORMAL HIGH (ref 70–99)

## 2011-03-06 LAB — CROSSMATCH: ABO/RH(D): B POS

## 2011-03-06 LAB — PHOSPHORUS: Phosphorus: 3 mg/dL (ref 2.3–4.6)

## 2011-03-06 LAB — PROTIME-INR
INR: 1.05 (ref 0.00–1.49)
Prothrombin Time: 14 seconds (ref 11.6–15.2)

## 2011-03-06 LAB — MRSA PCR SCREENING: MRSA by PCR: NEGATIVE

## 2011-03-06 LAB — CARDIAC PANEL(CRET KIN+CKTOT+MB+TROPI): CK, MB: 2.8 ng/mL (ref 0.3–4.0)

## 2011-03-07 ENCOUNTER — Encounter (HOSPITAL_COMMUNITY): Payer: Medicare Other

## 2011-03-07 ENCOUNTER — Inpatient Hospital Stay (HOSPITAL_COMMUNITY): Payer: Medicare Other

## 2011-03-07 LAB — GLUCOSE, CAPILLARY
Glucose-Capillary: 224 mg/dL — ABNORMAL HIGH (ref 70–99)
Glucose-Capillary: 126 mg/dL — ABNORMAL HIGH (ref 70–99)
Glucose-Capillary: 124 mg/dL — ABNORMAL HIGH (ref 70–99)

## 2011-03-07 LAB — BASIC METABOLIC PANEL
BUN: 25 mg/dL — ABNORMAL HIGH (ref 6–23)
Chloride: 107 mEq/L (ref 96–112)
Creatinine, Ser: 1.48 mg/dL — ABNORMAL HIGH (ref 0.50–1.35)
Glucose, Bld: 112 mg/dL — ABNORMAL HIGH (ref 70–99)
Potassium: 4 mEq/L (ref 3.5–5.1)

## 2011-03-07 LAB — CBC
HCT: 37 % — ABNORMAL LOW (ref 39.0–52.0)
MCHC: 34.3 g/dL (ref 30.0–36.0)
MCV: 92.7 fL (ref 78.0–100.0)
Platelets: 111 10*3/uL — ABNORMAL LOW (ref 150–400)
RDW: 15.3 % (ref 11.5–15.5)

## 2011-03-10 ENCOUNTER — Telehealth: Payer: Self-pay | Admitting: Pulmonary Disease

## 2011-03-10 ENCOUNTER — Encounter (HOSPITAL_COMMUNITY): Payer: Medicare Other

## 2011-03-10 NOTE — Telephone Encounter (Signed)
Have not seen biopsy result as of yet Let him know that he is coughing up old blood that is dark in color, and this may go on for awhile. Ok to restart aspirin and plavix, but I need to see him this week.  ?wed, thurs, or Friday? Let him know to stop aspirin and plavix and to call us if he begins to cough up bright red blood.

## 2011-03-10 NOTE — Telephone Encounter (Signed)
Spoke with pt. He states had bronch on 8/16 and has since then been having hemoptysis every day. He states that blood comes up in a "blob" and is dark red in color. He denies any fever, but states "feeling lowsy". He has not started back on asa and plavix yet, and I advised him not to until hears back from [place]. KC, pls advise, thanks!

## 2011-03-10 NOTE — Telephone Encounter (Signed)
Spoke with pt and notified of recs per [facility]. Pt verbalized understanding. Appt sched with [place] for 03/12/11 at 10 am.

## 2011-03-11 ENCOUNTER — Other Ambulatory Visit: Payer: Self-pay

## 2011-03-11 NOTE — Telephone Encounter (Signed)
.   Requested Prescriptions   Pending Prescriptions Disp Refills  . CLOPIDOGREL BISULFATE 75 MG PO TABS 30 tablet 5    Sig: Take 1 tablet (75 mg total) by mouth daily.

## 2011-03-12 ENCOUNTER — Encounter: Payer: Self-pay | Admitting: Pulmonary Disease

## 2011-03-12 ENCOUNTER — Ambulatory Visit (INDEPENDENT_AMBULATORY_CARE_PROVIDER_SITE_OTHER)
Admission: RE | Admit: 2011-03-12 | Discharge: 2011-03-12 | Disposition: A | Discharge status: Home / Self Care | Payer: Medicare Other | Source: Ambulatory Visit | Attending: Pulmonary Disease | Admitting: Pulmonary Disease

## 2011-03-12 ENCOUNTER — Other Ambulatory Visit (INDEPENDENT_AMBULATORY_CARE_PROVIDER_SITE_OTHER): Payer: Medicare Other

## 2011-03-12 ENCOUNTER — Encounter (HOSPITAL_COMMUNITY): Payer: Medicare Other

## 2011-03-12 ENCOUNTER — Ambulatory Visit (INDEPENDENT_AMBULATORY_CARE_PROVIDER_SITE_OTHER): Payer: Medicare Other | Admitting: Pulmonary Disease

## 2011-03-12 DIAGNOSIS — R042 Hemoptysis: Secondary | ICD-10-CM

## 2011-03-12 DIAGNOSIS — R918 Other nonspecific abnormal finding of lung field: Secondary | ICD-10-CM

## 2011-03-12 DIAGNOSIS — R222 Localized swelling, mass and lump, trunk: Secondary | ICD-10-CM

## 2011-03-12 DIAGNOSIS — J449 Chronic obstructive pulmonary disease, unspecified: Secondary | ICD-10-CM

## 2011-03-12 LAB — CBC WITH DIFFERENTIAL/PLATELET
Basophils Absolute: 0 10*3/uL (ref 0.0–0.1)
Eosinophils Absolute: 0.3 10*3/uL (ref 0.0–0.7)
Eosinophils Relative: 3.5 % (ref 0.0–5.0)
HCT: 40.9 % (ref 39.0–52.0)
Lymphs Abs: 2.4 10*3/uL (ref 0.7–4.0)
MCHC: 33.6 g/dL (ref 30.0–36.0)
MCV: 94.7 fl (ref 78.0–100.0)
Monocytes Absolute: 0.7 10*3/uL (ref 0.1–1.0)
Platelets: 145 10*3/uL — ABNORMAL LOW (ref 150.0–400.0)
RDW: 16.4 % — ABNORMAL HIGH (ref 11.5–14.6)

## 2011-03-12 MED ORDER — ALBUTEROL SULFATE HFA 108 (90 BASE) MCG/ACT IN AERS: 2.0000 | INHALATION_SPRAY | Freq: Four times a day (QID) | RESPIRATORY_TRACT | Status: DC | PRN

## 2011-03-12 MED ORDER — TIOTROPIUM BROMIDE MONOHYDRATE 18 MCG IN CAPS: 18.0000 ug | ORAL_CAPSULE | Freq: Every day | RESPIRATORY_TRACT | Status: DC

## 2011-03-12 NOTE — Assessment & Plan Note (Signed)
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  His most recent pulmonary function studies showed that he is an acceptable candidate for resection, but he certainly has risk factors with moderate COPD and known coronary disease.  He will obviously need clearance from cardiology.  At this point, I would like to refer him to the multidisciplinary thoracic oncology clinic for evaluation.

## 2011-03-12 NOTE — Assessment & Plan Note (Signed)
The patient has moderate obstructive disease based on his pulmonary function studies, and therefore will give him a trial of Spiriva.  May consider adding LABA/ICS leading up to possible surgery.

## 2011-03-12 NOTE — Progress Notes (Signed)
  Subjective:    Patient ID: Kyle [name], male    DOB: 1933/11/02, 77 y.o.   MRN: [id number]  HPI The patient comes in today for followup after his recent CT-guided biopsy of his right lower lobe nodule.  He unfortunately had a hemostasis post biopsy, and required a 24 hour admission to the hospital for monitoring.  He maintained a stable hemoglobin overnight, and had significant improvement in his quantity of hemoptysis.  The patient also has a history of COPD but recent PFTs, but has not been started on medications as of yet.  The patient comes in today for a discussion of his biopsy, which revealed adenocarcinoma.  I have reviewed this with him.  After discharge from the hospital, the patient had a significant decrease in his hemoptysis, and the mucus was primarily dark blood.  I had okayed for him to go back on Plavix and aspirin, but the last few days he has noticed a tinge of bright red blood.  We will obviously discontinue these medications again until he is improved.   Review of Systems  Constitutional: Negative for fever and unexpected weight change.  HENT: Positive for congestion. Negative for ear pain, nosebleeds, sore throat, rhinorrhea, sneezing, trouble swallowing, dental problem, postnasal drip and sinus pressure.   Eyes: Negative for redness and itching.  Respiratory: Positive for cough. Negative for chest tightness, shortness of breath and wheezing.   Cardiovascular: Negative for palpitations and leg swelling.  Gastrointestinal: Negative for nausea and vomiting.  Genitourinary: Negative for dysuria.  Musculoskeletal: Negative for joint swelling.  Skin: Negative for rash.  Neurological: Negative for headaches.  Hematological: Bruises/bleeds easily.  Psychiatric/Behavioral: Negative for dysphoric mood. The patient is not nervous/anxious.        Objective:   Physical Exam Obese male in no acute distress Nose without purulence or discharge Chest with decreased breath sounds,  mild crackles right base, no wheezing Cardiac exam with regular rate and rhythm Lower extremities with mild edema, no cyanosis noted Alert and oriented, moves all 4 extremities.       Assessment & Plan:

## 2011-03-12 NOTE — Assessment & Plan Note (Signed)
The patient's hemoptysis significantly improved after discharge, but he has had a little more right red blood since restarting Plavix and aspirin.  I have asked him to discontinue this until next week.  We'll also check followup x-ray today, as well as a CBC for completeness.  The patient will let us know if he begins to have an increase in hemoptysis.

## 2011-03-12 NOTE — Patient Instructions (Addendum)
Will check a cxr and blood count today, and call you with results. Stop aspirin and plavix until next week, and can restart if your cough is not producing bright red blood Will refer to the oncology clinic for evaluation Will need surgical clearance from your cardiologist.  Will start on spiriva one inhalation each am for your emphysema, along with proair 2 puffs every 6hrs if needed for emergencies. I will see you in the hospital if you have surgery, but if you do not, would like to see you back in 29mos.

## 2011-03-14 ENCOUNTER — Encounter: Payer: Self-pay | Admitting: Cardiology

## 2011-03-14 ENCOUNTER — Encounter (HOSPITAL_COMMUNITY)
Admission: RE | Admit: 2011-03-14 | Discharge: 2011-03-14 | Disposition: A | Discharge status: Home / Self Care | Payer: Medicare Other | Source: Ambulatory Visit | Attending: Cardiology | Admitting: Cardiology

## 2011-03-14 MED ORDER — CLOPIDOGREL BISULFATE 75 MG PO TABS: 75.0000 mg | ORAL_TABLET | Freq: Every day | ORAL | Status: DC

## 2011-03-14 NOTE — Discharge Summary (Addendum)
NAMESAVONE, WEHRI NO.:  [id number]  MEDICAL RECORD NO.:  [id number]  LOCATION:  [id number]                         FACILITY:  [facility]  PHYSICIAN:  Kathee Delton, MD,FCCPDATE OF BIRTH:  1933/11/07  DATE OF ADMISSION:  03/06/2011 DATE OF DISCHARGE:  03/07/2011                              DISCHARGE SUMMARY   DISCHARGE DIAGNOSES: 1. Right lower lobe pulmonary mass, status post fine-needle aspiration     with hemoptysis. 2. Chronic obstructive pulmonary disease/obstructive sleep apnea. 3. Coronary artery disease/hypertension/aortic aneurysm. 4. Chronic kidney disease. 5. Hyperglycemia.  LABORATORY DATA:  August 17th, laboratory data includes a BMP which demonstrates sodium 140, potassium 4.0, chloride 107, CO2 of 25, glucose 112, BUN 25, creatinine 1.48, calcium 9.1.  August 17th, CBC demonstrates WBC 5.6, hemoglobin 12.7, hematocrit 37, platelet count 111.  Hemoglobin A1c on August 16th is 6.0.  August 16th, PT is 13.9, INR 1.05.  PATHOLOGY:  Fine-needle aspiration of the right lower lobe pulmonary mass is pending at time of dictation.  This will be followed upon in the office with Dr. Gwenette Greet.  RADIOLOGIC DATA:  Portable chest x-ray on August 17th demonstrates improvement in pulmonary hemorrhage in the right midlung after biopsy, small amount of air space opacity persisting, no new acute abnormalities.  MICRO DATA:  None.  HISTORY OF PRESENT ILLNESS:  [name] is a 77 year old white male with a known history of morbid obesity and life-long smoking with multiple health issues to include diet-controlled diabetes, hyperlipidemia, rosacea, obstructive sleep apnea on CPAP, bladder cancer, BPH, seasonal allergies, and history of chronic kidney disease.  On August 4th, Mr. Frisella was at [facility] for abdominal pain with noted kidney stone and had a CT abdomen and pelvis which demonstrated a right lower lobe mass.  He underwent a CT-guided fine-needle  aspiration on the morning of August 16th and postaspiration, he did have hemoptysis. Consideration for intubation was made, however, the bleeding stopped after preparing for intubation.  He was admitted to the intensive care unit for close ICU observation.  Bleeding has ceased at time of dictation and chest x-ray upon evaluation on the a.m. of dictation has significant improvement in bleeding at site of biopsy.  Aspirin and Plavix will be held over the weekend postdischarge and can be resumed on Monday, August 20th. He was positioned with right side down and so bleeding stopped.  He was placed on nebulized bronchodilators and AutoSet CPAP nocturnally.  HOSPITAL COURSE BY DISCHARGE DIAGNOSES: 1. Right lower lobe mass, status post CT-guided fine-needle aspiration     with acute hemorrhage postbiopsy.  As per HPI, Mr. Molle presented     to [facility] on July 4th with an incidental finding of the right     lower lobe pulmonary mass.  He was scheduled for a CT- guided fine-     needle aspiration for biopsy on August 16th and unfortunately did     have bleeding postoperatively.  Out of concern for airway     protection, he was almost intubated, but not as the hemoptysis     stopped as they were preparing for intubation.  He was admitted to     the  intensive care unit for close observation and no further     bleeding was observed.  Aspirin and Plavix were held during     hospitalization and will be resumed on Monday August 20th. 2. Chronic obstructive pulmonary disease/obstructive sleep apnea.  Mr.     Vanantwerp is a morbidly obese adult male who does have a history of     lifelong smoking and obstructive sleep apnea.  He was placed on     AutoSet CPAP at night during hospitalization and tolerated this     well.  No changes to pulmonary regimen were made during     hospitalization.  Chest x-ray on the a.m. of discharge demonstrates     improved hemorrhage. 3. Coronary artery  disease/hypertension/aortic aneurysm.  These are     known historical diagnoses and blood pressure medications were held     during hospital course with p.r.n. IV Lopressor for needs.  A 12-     lead EKG was assessed and cardiac enzymes which were negative for     complete evaluation in the setting of hemoptysis. 4. Chronic kidney disease.  This also is a historical diagnosis for     [name].  On June 12th, he had a creatinine of 1.45 and on July     4th of 2.4.  At time of discharge dictation, creatinine is 1.48. 5. Diet-controlled diabetes mellitus.  Mr. Royster's hemoglobin A1c was     assessed during hospitalization and is 6.0.  DISCHARGE INSTRUCTIONS: 1. Activity, as tolerated. 2. Diet, low-sodium heart healthy. 3. Followup, he is scheduled to follow up with Dr. Gwenette Greet on September     5th at 10:30. 4. Special considerations, Mr. Jourdan has been instructed to hold     aspirin and Plavix until Monday, August 20th, at which time he can     resume.  DISCHARGE MEDICATIONS: 1. Allegra 180 mg 1 tablet by mouth daily. 2. Alprazolam 0.5 mg by mouth daily. 3. Benzedrex nasal inhaler 1 puff in both nostrils daily as needed for     allergies. 4. Plavix 75 mg to be resumed on August 20th p.o. daily. 5. Doxycycline 50 mg by mouth daily. 6. Fluticasone 2 sprays nasally daily as needed for allergy. 7. Iron 65 mg by mouth daily. 8. Lisinopril 2.5 mg by mouth daily. 9. Metoprolol 50 mg by mouth twice daily. 10.Niaspan 500 mg SR 2 tablets by mouth daily at bedtime. 11.Nitroglycerin 0.4 mg every 5 minutes as needed up to 3 doses for     chest pain. 12.Pravachol 80 mg by mouth daily at bedtime. 13.Tylenol Extra Strength 500 mg every 8 hours as needed for pain. 14.Vitamin D2 50,000 units 1 capsule by mouth monthly. 15.Aspirin 81 mg by mouth daily which will be resumed on August 20th,     held over the weekend.  DISPOSITION AT TIME OF DISCHARGE:  Mr. Chavanne has met maximum benefit of inpatient  therapy and is currently medically stable and cleared for discharge pending follow up with Dr. Danton Sewer as above.  He has been instructed at time of discharge that if he has any further hemoptysis that he should report to the emergency department immediately.  He does indicate verbal understanding.     Noe Gens, NP   ______________________________ Kathee Delton, MD,FCCP    BO/MEDQ  D:  03/07/2011  T:  03/08/2011  Job:  [id number]  Electronically Signed by Noe Gens  on 03/14/2011 03:53:03 PM Electronically Signed by Danton Sewer MDFCCP on  05/05/2011 01:13:34 PM

## 2011-03-17 ENCOUNTER — Other Ambulatory Visit: Payer: Self-pay | Admitting: *Deleted

## 2011-03-17 ENCOUNTER — Encounter (HOSPITAL_COMMUNITY)
Admission: RE | Admit: 2011-03-17 | Discharge: 2011-03-17 | Disposition: A | Discharge status: Home / Self Care | Payer: Medicare Other | Source: Ambulatory Visit | Attending: Cardiology | Admitting: Cardiology

## 2011-03-17 MED ORDER — CLOPIDOGREL BISULFATE 75 MG PO TABS: 75.0000 mg | ORAL_TABLET | Freq: Every day | ORAL | Status: DC

## 2011-03-18 ENCOUNTER — Ambulatory Visit (INDEPENDENT_AMBULATORY_CARE_PROVIDER_SITE_OTHER): Payer: Medicare Other | Admitting: Cardiology

## 2011-03-18 ENCOUNTER — Encounter: Payer: Self-pay | Admitting: Cardiology

## 2011-03-18 DIAGNOSIS — J449 Chronic obstructive pulmonary disease, unspecified: Secondary | ICD-10-CM

## 2011-03-18 DIAGNOSIS — C3490 Malignant neoplasm of unspecified part of unspecified bronchus or lung: Secondary | ICD-10-CM

## 2011-03-18 DIAGNOSIS — I10 Essential (primary) hypertension: Secondary | ICD-10-CM

## 2011-03-18 DIAGNOSIS — I2510 Atherosclerotic heart disease of native coronary artery without angina pectoris: Secondary | ICD-10-CM

## 2011-03-18 DIAGNOSIS — R042 Hemoptysis: Secondary | ICD-10-CM

## 2011-03-18 NOTE — Patient Instructions (Signed)
Your physician recommends that you schedule a follow-up appointment in: 6 weeks  Your physician has recommended you make the following change in your medication: STOP Plavix -- resume when ok with Dr Gwenette Greet & Dr Arlyce Dice ---- HOLD Spiriva and see if sleep improves

## 2011-03-18 NOTE — Progress Notes (Signed)
HPI : Kyle [name] returns to the office as scheduled for continued assessment and treatment of coronary artery disease, having suffered a small myocardial infarction proximally 3 months ago.  Cardiac catheterization revealed critical disease in a small circumflex, and medical treatment was recommended.  He has done well on aspirin and clopidogrel, but developed significant hemoptysis following transthoracic lung biopsy requiring discontinuation of clopidogrel in recent weeks.  Patient was participating in cardiac rehabilitation with improved exercise tolerance, but biopsy demonstrated carcinoma, and lobectomy is anticipated.  Patient has not yet been seen by Dr. Arlyce Dice.  Current Outpatient Prescriptions on File Prior to Visit  Medication Sig Dispense Refill  . acetaminophen (TYLENOL) 500 MG tablet Take 500 mg by mouth every 6 (six) hours as needed.        Marland Kitchen albuterol (PROAIR HFA) 108 (90 BASE) MCG/ACT inhaler Inhale 2 puffs into the lungs every 6 (six) hours as needed for wheezing.  1 Inhaler  1  . ALPRAZolam (XANAX) 0.5 MG tablet Take 0.5 mg by mouth at bedtime as needed.        Marland Kitchen amLODipine (NORVASC) 5 MG tablet Take 5 mg by mouth daily.       Marland Kitchen doxycycline (VIBRAMYCIN) 50 MG capsule Take 50 mg by mouth daily.       . ergocalciferol (VITAMIN D2) 50000 UNITS capsule Take 50,000 Units by mouth every 30 (thirty) days.       . ferrous sulfate (FERROUSUL) 325 (65 FE) MG tablet Take 325 mg by mouth daily with breakfast.        . fexofenadine (ALLEGRA) 180 MG tablet Take 180 mg by mouth daily.        . fluticasone (VERAMYST) 27.5 MCG/SPRAY nasal spray Place 2 sprays into the nose daily.        . metoprolol (LOPRESSOR) 50 MG tablet Take 50 mg by mouth 2 (two) times daily.       Marland Kitchen NIASPAN 500 MG CR tablet Take 500 mg by mouth 2 (two) times daily.       Marland Kitchen NITROSTAT 0.4 MG SL tablet Place 0.4 mg under the tongue every 5 (five) minutes as needed.       . pravastatin (PRAVACHOL) 80 MG tablet Take 80 mg by mouth  daily.       Marland Kitchen Propylhexedrine (BENZEDREX NA) as needed.       . tiotropium (SPIRIVA HANDIHALER) 18 MCG inhalation capsule Place 1 capsule (18 mcg total) into inhaler and inhale daily.  30 capsule  2  . aspirin 81 MG tablet Take 81 mg by mouth daily.           No Known Allergies    Past medical history, social history, and family history reviewed and updated.  ROS: See HPI.  PHYSICAL EXAM: BP 122/74  Pulse 59  Resp 20  Ht 5\' 11"  (1.803 m)  Wt 270 lb 6.4 oz (122.653 kg)  BMI 37.71 kg/m2  SpO2 92%  General-Well developed; no acute distress Body habitus-proportionate weight and height Neck-No JVD; no carotid bruits Lungs-clear lung fields; resonant to percussion Cardiovascular-normal PMI; normal S1 and S2 Abdomen-normal bowel sounds; soft and non-tender without masses or organomegaly Musculoskeletal-No deformities, no cyanosis or clubbing Neurologic-Normal cranial nerves; symmetric strength and tone Skin-Warm, no significant lesions Extremities-distal pulses intact; no edema  ASSESSMENT AND PLAN:

## 2011-03-19 ENCOUNTER — Encounter: Payer: Self-pay | Admitting: Cardiology

## 2011-03-19 ENCOUNTER — Encounter (HOSPITAL_COMMUNITY)
Admission: RE | Admit: 2011-03-19 | Discharge: 2011-03-19 | Disposition: A | Discharge status: Home / Self Care | Payer: Medicare Other | Source: Ambulatory Visit | Attending: Cardiology | Admitting: Cardiology

## 2011-03-19 NOTE — Assessment & Plan Note (Signed)
BP well controlled with current medication, which will be continued.

## 2011-03-19 NOTE — Assessment & Plan Note (Addendum)
Neoplasm appears to be early stage and potentially curable; accordingly, lobectomy will be necessary unless risk is severe, which does not appear to be the case.  From a cardiac standpoint, he has suffered a very small MI 3 months ago, has significant 3 vessel CAD, but also has normal LV function.  Infarct vessel was small in caliber and distribution.  If perioperative MI occurs, it should be small.  Risk of cardiac complication with surgery likely less than 10%.  Pt encouraged to proceed with surgery if recommended by Thoracic Surgery.

## 2011-03-19 NOTE — Assessment & Plan Note (Signed)
Doing well from a cardiac standpoint despite blood loss and discontinuation of anti-platelet agents.  He should complete a 1 year course of clopidigrel and resume aspirin when approved by Pulmonary.

## 2011-03-19 NOTE — Assessment & Plan Note (Signed)
Blood tinged sputum persists.  Remains off ASA and clopidigrel.  Risk of ACS with discontinuation of medication not terribly high, but resumption of these drugs as soon as safe from a pulmonary standpoint would be desirable.

## 2011-03-19 NOTE — Assessment & Plan Note (Signed)
Pt c/o sleep disturbance, which he attributes to spiriva-present for 6 days since he started drug.  He does not note significant benefit in COPD symptoms.  I explained that spiriva not associated with sleep problems, but he is unconvinced.  I suggested he hold for a few days to see if symptoms improve.

## 2011-03-20 ENCOUNTER — Institutional Professional Consult (permissible substitution) (INDEPENDENT_AMBULATORY_CARE_PROVIDER_SITE_OTHER): Payer: Medicare Other | Admitting: Thoracic Surgery

## 2011-03-20 VITALS — BP 121/69 | HR 72 | Temp 97.9°F | Resp 18 | Ht 72.0 in | Wt 270.0 lb

## 2011-03-20 DIAGNOSIS — D381 Neoplasm of uncertain behavior of trachea, bronchus and lung: Secondary | ICD-10-CM

## 2011-03-20 DIAGNOSIS — C3490 Malignant neoplasm of unspecified part of unspecified bronchus or lung: Secondary | ICD-10-CM

## 2011-03-20 NOTE — Progress Notes (Signed)
HPI this 77 year old Caucasian male has a right lower lobe lesion. He quit smoking 3 months ago. He has a long history of tobacco abuse. He has a history of coronary artery disease as well as morbid obesity. He will obtain cardiac clamps from Dr. Lattie Haw. His PET scan showed a right lower lobe lesion with increased uptake. 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 There was no evidence of metastasis to the hilar lymph nodes. He is referred for resection. The risk of the procedure was explained to the patient and his wife. They agreed to the risk and we will proceed with surgery on September 13 at [facility].   Current Outpatient Prescriptions  Medication Sig Dispense Refill  . acetaminophen (TYLENOL) 500 MG tablet Take 500 mg by mouth every 6 (six) hours as needed.        Marland Kitchen albuterol (PROAIR HFA) 108 (90 BASE) MCG/ACT inhaler Inhale 2 puffs into the lungs every 6 (six) hours as needed for wheezing.  1 Inhaler  1  . ALPRAZolam (XANAX) 0.5 MG tablet Take 0.5 mg by mouth at bedtime as needed.        Marland Kitchen amLODipine (NORVASC) 5 MG tablet Take 5 mg by mouth daily.       Marland Kitchen doxycycline (VIBRAMYCIN) 50 MG capsule Take 50 mg by mouth daily.       . ergocalciferol (VITAMIN D2) 50000 UNITS capsule Take 50,000 Units by mouth every 30 (thirty) days.       . ferrous sulfate (FERROUSUL) 325 (65 FE) MG tablet Take 325 mg by mouth daily with breakfast.        . fexofenadine (ALLEGRA) 180 MG tablet Take 180 mg by mouth daily.        . fluticasone (VERAMYST) 27.5 MCG/SPRAY nasal spray Place 2 sprays into the nose daily.        Marland Kitchen lisinopril (PRINIVIL,ZESTRIL) 2.5 MG tablet Take 2.5 mg by mouth daily.        . metoprolol (LOPRESSOR) 50 MG tablet Take 50 mg by mouth 2 (two) times daily.       Marland Kitchen NIASPAN 500 MG CR tablet Take 500 mg by mouth 2 (two) times daily.       Marland Kitchen NITROSTAT 0.4 MG SL tablet Place 0.4 mg under the tongue every 5 (five) minutes as needed.       . pravastatin (PRAVACHOL) 80 MG tablet  Take 80 mg by mouth daily.       Marland Kitchen Propylhexedrine (BENZEDREX NA) as needed.       . tiotropium (SPIRIVA HANDIHALER) 18 MCG inhalation capsule Place 1 capsule (18 mcg total) into inhaler and inhale daily.  30 capsule  2  . aspirin 81 MG tablet Take 81 mg by mouth daily.            Physical Exam  Constitutional: He is oriented to person, place, and time.       He is a morbidly obese Caucasian male in no acute distress.  HENT:  Head: Normocephalic and atraumatic.  Right Ear: External ear normal.  Left Ear: External ear normal.  Mouth/Throat: Oropharynx is clear and moist.  Eyes: EOM are normal. Pupils are equal, round, and reactive to light.  Neck: Normal range of motion. Neck supple.  Cardiovascular: Normal rate and regular rhythm.   No murmur heard. Pulmonary/Chest: Effort normal and breath sounds normal.  Abdominal: Soft. Bowel sounds are normal. He exhibits no mass.  Musculoskeletal: Normal range of motion. He exhibits no edema.  Lymphadenopathy:    He has no cervical adenopathy.  Neurological: He is alert and oriented to person, place, and time. He has normal reflexes. No cranial nerve deficit.  Skin: Skin is warm and dry.  Psychiatric: He has a normal mood and affect. His behavior is normal. Judgment and thought content normal.     Diagnostic tests: PET scan is positive with a standard uptake value of 5. needle biopsy reveals adenocarcinoma right lower lobe   Impression: Adenocarcinoma right lower lobe.   Plan: Resection of right lower lobe or segmentectomy

## 2011-03-21 ENCOUNTER — Encounter (HOSPITAL_COMMUNITY)
Admission: RE | Admit: 2011-03-21 | Discharge: 2011-03-21 | Disposition: A | Discharge status: Home / Self Care | Payer: Medicare Other | Source: Ambulatory Visit | Attending: Cardiology | Admitting: Cardiology

## 2011-03-24 ENCOUNTER — Encounter (HOSPITAL_COMMUNITY): Payer: Medicare Other

## 2011-03-26 ENCOUNTER — Encounter (HOSPITAL_COMMUNITY)
Admission: RE | Admit: 2011-03-26 | Discharge: 2011-03-26 | Disposition: A | Discharge status: Home / Self Care | Payer: Medicare Other | Source: Ambulatory Visit | Attending: Cardiology | Admitting: Cardiology

## 2011-03-26 ENCOUNTER — Inpatient Hospital Stay: Payer: Medicare Other | Admitting: Pulmonary Disease

## 2011-03-27 ENCOUNTER — Encounter: Payer: Self-pay | Admitting: Cardiology

## 2011-03-27 DIAGNOSIS — N200 Calculus of kidney: Secondary | ICD-10-CM | POA: Insufficient documentation

## 2011-03-27 DIAGNOSIS — Z9989 Dependence on other enabling machines and devices: Secondary | ICD-10-CM

## 2011-03-27 DIAGNOSIS — G4733 Obstructive sleep apnea (adult) (pediatric): Secondary | ICD-10-CM | POA: Insufficient documentation

## 2011-03-27 DIAGNOSIS — C679 Malignant neoplasm of bladder, unspecified: Secondary | ICD-10-CM | POA: Insufficient documentation

## 2011-03-27 DIAGNOSIS — R7301 Impaired fasting glucose: Secondary | ICD-10-CM | POA: Insufficient documentation

## 2011-03-28 ENCOUNTER — Encounter (HOSPITAL_COMMUNITY)
Admission: RE | Admit: 2011-03-28 | Discharge: 2011-03-28 | Disposition: A | Discharge status: Home / Self Care | Payer: Medicare Other | Source: Ambulatory Visit | Attending: Cardiology | Admitting: Cardiology

## 2011-03-31 ENCOUNTER — Encounter (HOSPITAL_COMMUNITY)
Admission: RE | Admit: 2011-03-31 | Discharge: 2011-03-31 | Disposition: A | Discharge status: Home / Self Care | Payer: Medicare Other | Source: Ambulatory Visit | Attending: Cardiology | Admitting: Cardiology

## 2011-04-01 ENCOUNTER — Encounter (HOSPITAL_COMMUNITY)
Admission: RE | Admit: 2011-04-01 | Discharge: 2011-04-01 | Disposition: A | Discharge status: Home / Self Care | Payer: Medicare Other | Source: Ambulatory Visit | Attending: Thoracic Surgery | Admitting: Thoracic Surgery

## 2011-04-01 ENCOUNTER — Ambulatory Visit (HOSPITAL_COMMUNITY)
Admission: RE | Admit: 2011-04-01 | Discharge: 2011-04-01 | Disposition: A | Discharge status: Home / Self Care | Payer: Medicare Other | Source: Ambulatory Visit | Attending: Thoracic Surgery | Admitting: Thoracic Surgery

## 2011-04-01 ENCOUNTER — Other Ambulatory Visit: Payer: Self-pay | Admitting: Thoracic Surgery

## 2011-04-01 ENCOUNTER — Other Ambulatory Visit: Payer: Self-pay

## 2011-04-01 ENCOUNTER — Encounter: Payer: Self-pay | Admitting: Thoracic Surgery

## 2011-04-01 DIAGNOSIS — Z01812 Encounter for preprocedural laboratory examination: Secondary | ICD-10-CM | POA: Insufficient documentation

## 2011-04-01 DIAGNOSIS — Z01818 Encounter for other preprocedural examination: Secondary | ICD-10-CM | POA: Insufficient documentation

## 2011-04-01 DIAGNOSIS — C3490 Malignant neoplasm of unspecified part of unspecified bronchus or lung: Secondary | ICD-10-CM

## 2011-04-01 DIAGNOSIS — Z01810 Encounter for preprocedural cardiovascular examination: Secondary | ICD-10-CM | POA: Insufficient documentation

## 2011-04-01 DIAGNOSIS — J984 Other disorders of lung: Secondary | ICD-10-CM | POA: Insufficient documentation

## 2011-04-01 LAB — BLOOD GAS, ARTERIAL
Bicarbonate: 23.7 mEq/L (ref 20.0–24.0)
FIO2: 0.21 %
O2 Saturation: 91.8 %
Patient temperature: 98.6

## 2011-04-01 LAB — COMPREHENSIVE METABOLIC PANEL
ALT: 15 U/L (ref 0–53)
Alkaline Phosphatase: 72 U/L (ref 39–117)
BUN: 28 mg/dL — ABNORMAL HIGH (ref 6–23)
CO2: 23 mEq/L (ref 19–32)
Chloride: 109 mEq/L (ref 96–112)
GFR calc Af Amer: 51 mL/min — ABNORMAL LOW (ref >60)
GFR calc non Af Amer: 42 mL/min — ABNORMAL LOW (ref >60)
Glucose, Bld: 112 mg/dL — ABNORMAL HIGH (ref 70–99)
Potassium: 4.7 mEq/L (ref 3.5–5.1)
Sodium: 143 mEq/L (ref 135–145)
Total Bilirubin: 1.1 mg/dL (ref 0.3–1.2)
Total Protein: 6.8 g/dL (ref 6.0–8.3)

## 2011-04-01 LAB — URINALYSIS, ROUTINE W REFLEX MICROSCOPIC
Bilirubin Urine: NEGATIVE
Ketones, ur: NEGATIVE mg/dL
Leukocytes, UA: NEGATIVE
Nitrite: NEGATIVE
Protein, ur: 30 mg/dL — AB
pH: 5.5 (ref 5.0–8.0)

## 2011-04-01 LAB — SURGICAL PCR SCREEN
MRSA, PCR: NEGATIVE
Staphylococcus aureus: NEGATIVE

## 2011-04-01 LAB — CBC
HCT: 41.8 % (ref 39.0–52.0)
MCHC: 34.4 g/dL (ref 30.0–36.0)
RDW: 15.4 % (ref 11.5–15.5)

## 2011-04-01 LAB — PROTIME-INR: INR: 1.1 (ref 0.00–1.49)

## 2011-04-01 MED ORDER — CLOPIDOGREL BISULFATE 75 MG PO TABS: 75.0000 mg | ORAL_TABLET | Freq: Every day | ORAL | Status: DC

## 2011-04-01 NOTE — Telephone Encounter (Signed)
.   Requested Prescriptions   Pending Prescriptions Disp Refills  . clopidogrel (PLAVIX) 75 MG tablet 90 tablet 3    Sig: Take 1 tablet (75 mg total) by mouth daily.   [facility] requested 90 day supply for insurance.

## 2011-04-02 ENCOUNTER — Telehealth: Payer: Self-pay | Admitting: Cardiology

## 2011-04-02 ENCOUNTER — Encounter: Payer: Self-pay | Admitting: Cardiology

## 2011-04-02 ENCOUNTER — Encounter (HOSPITAL_COMMUNITY)
Admission: RE | Admit: 2011-04-02 | Discharge: 2011-04-02 | Disposition: A | Discharge status: Home / Self Care | Payer: Medicare Other | Source: Ambulatory Visit | Attending: Cardiology | Admitting: Cardiology

## 2011-04-02 NOTE — H&P (Signed)
  NAMEREDDEN, KILPELA NO.:  [id number]  MEDICAL RECORD NO.:  [id number]  LOCATION:  XRAY                         FACILITY:  [facility]  PHYSICIAN:  Nicanor Alcon, M.D. DATE OF BIRTH:  June 28, 1934  DATE OF ADMISSION:  04/01/2011 DATE OF DISCHARGE:                             HISTORY & PHYSICAL   CHIEF COMPLAINT:  Right lower lobe mass.  HISTORY OF PRESENT ILLNESS:  This 77 year old patient who has a long history of smoking was found to have a right lower lobe mass by Dr. Gwenette Greet and underwent a needle biopsy which showed adenocarcinoma.  PET scan showed a maximum uptake value of 7.8 and it measured 1.8 and it was in the right lower lobe, in the lateral basilar segment.  He has had no fever, chills, or excessive sputum.  He is being admitted for right lower lobectomy or possible segmentectomy.  He also has cystic masses in his kidneys and a stable adrenal nodule.  This right lower lobe nodule is new; it was first seen on abdominal CT scan.  He had some hemoptysis after his biopsy.  MEDICATIONS:  Spiriva, ProAir, Tylenol, albuterol, alprazolam, Norvasc, aspirin, and Plavix.  He has had Vibramycin in the past.  Vitamin D2, ferrous sulfate, Veramyst, lisinopril, metoprolol, Niaspan, nitroglycerin, pravastatin, propylhexedrine, and Spiriva.  ALLERGIES:  NO ALLERGIES.  PAST MEDICAL HISTORY:  His other problems include chronic obstructive pulmonary disease, history of tobacco abuse, upper lung mass, chronic kidney disease stage I, coronary artery disease, hypertension, and peripheral arterial disease.  He has had a previous myocardial infarction.  He has diabetes mellitus type 2.  FAMILY HISTORY:  Noncontributory.  SOCIAL HISTORY:  He is married.  He has 2 children.  He is retired.  He quit smoking 4 months ago.  He does not drink alcohol on a regular basis.   DICTATION ENDED AT THIS POINT.     Nicanor Alcon, M.D.     DPB/MEDQ  D:  04/01/2011  T:   04/01/2011  Job:  [id number]  Electronically Signed by Ethelda Chick M.D. on 04/02/2011 02:27:29 PM

## 2011-04-02 NOTE — Telephone Encounter (Signed)
Calling for surgical clearance letter or call. Last office note indicates Dr Lattie Haw is aware but does not say if patient is cleared.  Patient is scheduled for surgery tomorrow 04/03/11. Please advise ASAP.

## 2011-04-02 NOTE — H&P (Signed)
Kyle [name], Kyle [name]                ACCOUNT NO.:  [id number]  MEDICAL RECORD NO.:  [id number]  LOCATION:  SDS                          FACILITY:  [facility]  PHYSICIAN:  Nicanor Alcon, M.D. DATE OF BIRTH:  Nov 29, 1933  DATE OF ADMISSION:  04/01/2011 DATE OF DISCHARGE:                             HISTORY & PHYSICAL   CHIEF COMPLAINT:  Lung mass.  HISTORY OF PRESENT ILLNESS:  This is a 77 year old patient, who was found to have a right lower lobe nodule on abdominal CT scan.  The CT scan was done for enlarging renal cyst.  The nodule was 1.8 mm in size. It was positive with a standard uptake value of 7.5 within the lateral basilar segments of the right lower lobe.  Biopsy revealed adenocarcinoma, non-small-cell lung cancer.  He quit smoking 3 months ago.  There is a long history of tobacco abuse.  He was admitted for hemoptysis after his biopsy, but this is cleared.  He has a history of coronary artery disease and previous myocardial infarction.  MEDICATIONS:  He is on Tylenol, albuterol, Xanax, Norvasc, Plavix, Vibramycin, vitamin D2, ferrous sulfate, Allegra, Veramyst, Prinivil, metoprolol, Niaspan, pravastatin, Nitrostat, Benzedrex, and Spiriva.  ALLERGIES:  None.  PAST MEDICAL HISTORY:  He has chronic obstructive pulmonary disease, chronic kidney disease stage I, coronary artery disease, hypertension, and peripheral vascular disease.  SOCIAL HISTORY:  He is married with two children.  Quit smoking 3 months ago.  Does not drink alcohol on a regular basis.  REVIEW OF SYSTEMS:  He is 270 pounds.  He is 6 feet.  GENERAL:  His weight has been stable.  CARDIAC:  He has shortness of breath with exertion.  See history of present illness for his cough.  GI:  No nausea, vomiting, constipation, diarrhea.  GU:  Stage I kidney disease. VASCULAR:  No claudication, DVT, TIAs.  NEUROLOGICAL:  No dizziness, headaches, blackouts, seizures.  MUSCULOSKELETAL:  Arthritis. PSYCHIATRIC:  Nervous.   EYES/ENT:  No changes in eyesight or hearing. HEMATOLOGICAL:  No problems with bleeding, clotting disorders, or anemia.  PHYSICAL EXAMINATION:  GENERAL:  He is an obese, Caucasian male in no acute distress. VITAL SIGNS:  His blood pressure is 140/80, pulse 60, respirations 18, sats were 94%.  His pulmonary function tests showed an FVC of 4 with an FEV1 of 2.40 and diffusion capacity of 59%. HEENT:  Head is atraumatic.  Eyes, pupils equally reactive to light and accommodation.  Ears, tympanic membranes are intact.  Nose, there is no septal deviation.  Throat is negative.  His uvula is in the midline.  No lesions. NECK:  Supple without thyromegaly. CHEST:  Clear to auscultation and percussion. HEART:  Regular sinus rhythm.  No murmurs. ABDOMEN:  Soft.  There is no hepatosplenomegaly. EXTREMITIES:  Pulses are 2+.  There is no clubbing or edema. NEUROLOGIC:  He is oriented x3.  Sensory and motor intact.  Cranial nerves intact.  IMPRESSION: 1. Adenocarcinoma of the right lower lobe. 2. Hypertension. 3. Tobacco abuse. 4. Chronic obstructive pulmonary disease. 5. Coronary artery disease. 6. Peripheral arterial disease. 7. Diabetes mellitus type 2.  PLAN:  Right lower lobectomy or segmentectomy.     Kyle [name]  Kyle [name], M.D.     DPB/MEDQ  D:  04/01/2011  T:  04/01/2011  Job:  [id number]  Electronically Signed by Ethelda Chick M.D. on 04/02/2011 02:27:26 PM

## 2011-04-03 ENCOUNTER — Other Ambulatory Visit: Payer: Self-pay | Admitting: Thoracic Surgery

## 2011-04-03 ENCOUNTER — Inpatient Hospital Stay (HOSPITAL_COMMUNITY)
Admission: RE | Admit: 2011-04-03 | Discharge: 2011-04-09 | DRG: 164 | Disposition: A | Discharge status: Home / Self Care | Payer: Medicare Other | Source: Ambulatory Visit | Attending: Thoracic Surgery | Admitting: Thoracic Surgery

## 2011-04-03 ENCOUNTER — Inpatient Hospital Stay (HOSPITAL_COMMUNITY): Payer: Medicare Other

## 2011-04-03 DIAGNOSIS — Z8551 Personal history of malignant neoplasm of bladder: Secondary | ICD-10-CM

## 2011-04-03 DIAGNOSIS — I2510 Atherosclerotic heart disease of native coronary artery without angina pectoris: Secondary | ICD-10-CM | POA: Diagnosis present

## 2011-04-03 DIAGNOSIS — Z79899 Other long term (current) drug therapy: Secondary | ICD-10-CM

## 2011-04-03 DIAGNOSIS — E119 Type 2 diabetes mellitus without complications: Secondary | ICD-10-CM | POA: Diagnosis present

## 2011-04-03 DIAGNOSIS — J4489 Other specified chronic obstructive pulmonary disease: Secondary | ICD-10-CM | POA: Diagnosis present

## 2011-04-03 DIAGNOSIS — Z7902 Long term (current) use of antithrombotics/antiplatelets: Secondary | ICD-10-CM

## 2011-04-03 DIAGNOSIS — Z7982 Long term (current) use of aspirin: Secondary | ICD-10-CM

## 2011-04-03 DIAGNOSIS — N179 Acute kidney failure, unspecified: Secondary | ICD-10-CM | POA: Diagnosis not present

## 2011-04-03 DIAGNOSIS — I129 Hypertensive chronic kidney disease with stage 1 through stage 4 chronic kidney disease, or unspecified chronic kidney disease: Secondary | ICD-10-CM | POA: Diagnosis present

## 2011-04-03 DIAGNOSIS — E669 Obesity, unspecified: Secondary | ICD-10-CM | POA: Diagnosis present

## 2011-04-03 DIAGNOSIS — G4733 Obstructive sleep apnea (adult) (pediatric): Secondary | ICD-10-CM | POA: Diagnosis present

## 2011-04-03 DIAGNOSIS — C3430 Malignant neoplasm of lower lobe, unspecified bronchus or lung: Secondary | ICD-10-CM

## 2011-04-03 DIAGNOSIS — Z01812 Encounter for preprocedural laboratory examination: Secondary | ICD-10-CM

## 2011-04-03 DIAGNOSIS — Z87891 Personal history of nicotine dependence: Secondary | ICD-10-CM

## 2011-04-03 DIAGNOSIS — J449 Chronic obstructive pulmonary disease, unspecified: Secondary | ICD-10-CM | POA: Diagnosis present

## 2011-04-03 DIAGNOSIS — I252 Old myocardial infarction: Secondary | ICD-10-CM

## 2011-04-03 DIAGNOSIS — R0902 Hypoxemia: Secondary | ICD-10-CM | POA: Diagnosis not present

## 2011-04-03 DIAGNOSIS — N183 Chronic kidney disease, stage 3 unspecified: Secondary | ICD-10-CM | POA: Diagnosis present

## 2011-04-04 ENCOUNTER — Inpatient Hospital Stay (HOSPITAL_COMMUNITY): Payer: Medicare Other

## 2011-04-04 ENCOUNTER — Encounter (HOSPITAL_COMMUNITY): Payer: Medicare Other

## 2011-04-04 LAB — BASIC METABOLIC PANEL
BUN: 22 mg/dL (ref 6–23)
Calcium: 7.9 mg/dL — ABNORMAL LOW (ref 8.4–10.5)
Creatinine, Ser: 1.4 mg/dL — ABNORMAL HIGH (ref 0.50–1.35)
GFR calc Af Amer: 60 mL/min — ABNORMAL LOW (ref >60)
GFR calc non Af Amer: 50 mL/min — ABNORMAL LOW (ref >60)

## 2011-04-04 LAB — TYPE AND SCREEN
Antibody Screen: NEGATIVE
Unit division: 0

## 2011-04-04 LAB — CBC
HCT: 34.2 % — ABNORMAL LOW (ref 39.0–52.0)
MCHC: 33.6 g/dL (ref 30.0–36.0)
MCV: 92.4 fL (ref 78.0–100.0)
Platelets: 80 10*3/uL — ABNORMAL LOW (ref 150–400)
RDW: 15 % (ref 11.5–15.5)

## 2011-04-04 LAB — POCT I-STAT 3, ART BLOOD GAS (G3+)
Acid-base deficit: 2 mmol/L (ref 0.0–2.0)
Patient temperature: 98
pH, Arterial: 7.307 — ABNORMAL LOW (ref 7.350–7.450)

## 2011-04-05 ENCOUNTER — Inpatient Hospital Stay (HOSPITAL_COMMUNITY): Payer: Medicare Other

## 2011-04-05 LAB — COMPREHENSIVE METABOLIC PANEL
ALT: 17 U/L (ref 0–53)
AST: 12 U/L (ref 0–37)
Alkaline Phosphatase: 46 U/L (ref 39–117)
CO2: 26 mEq/L (ref 19–32)
Calcium: 8.5 mg/dL (ref 8.4–10.5)
Chloride: 109 mEq/L (ref 96–112)
GFR calc non Af Amer: 43 mL/min — ABNORMAL LOW (ref >60)
Potassium: 4.7 mEq/L (ref 3.5–5.1)
Sodium: 140 mEq/L (ref 135–145)

## 2011-04-05 LAB — CBC
MCH: 31.1 pg (ref 26.0–34.0)
Platelets: 82 10*3/uL — ABNORMAL LOW (ref 150–400)
RBC: 3.73 MIL/uL — ABNORMAL LOW (ref 4.22–5.81)
WBC: 7.9 10*3/uL (ref 4.0–10.5)

## 2011-04-06 ENCOUNTER — Inpatient Hospital Stay (HOSPITAL_COMMUNITY): Payer: Medicare Other

## 2011-04-06 LAB — CBC
Hemoglobin: 11.8 g/dL — ABNORMAL LOW (ref 13.0–17.0)
MCH: 31.3 pg (ref 26.0–34.0)
MCHC: 33.9 g/dL (ref 30.0–36.0)

## 2011-04-06 LAB — BASIC METABOLIC PANEL
BUN: 28 mg/dL — ABNORMAL HIGH (ref 6–23)
Calcium: 9 mg/dL (ref 8.4–10.5)
GFR calc non Af Amer: 35 mL/min — ABNORMAL LOW (ref >60)
Glucose, Bld: 113 mg/dL — ABNORMAL HIGH (ref 70–99)
Sodium: 139 mEq/L (ref 135–145)

## 2011-04-07 ENCOUNTER — Inpatient Hospital Stay (HOSPITAL_COMMUNITY): Payer: Medicare Other

## 2011-04-07 ENCOUNTER — Encounter (HOSPITAL_COMMUNITY): Payer: Medicare Other

## 2011-04-07 LAB — COMPREHENSIVE METABOLIC PANEL WITH GFR
ALT: 13 U/L (ref 0–53)
AST: 11 U/L (ref 0–37)
Albumin: 2.7 g/dL — ABNORMAL LOW (ref 3.5–5.2)
Alkaline Phosphatase: 43 U/L (ref 39–117)
BUN: 30 mg/dL — ABNORMAL HIGH (ref 6–23)
CO2: 28 meq/L (ref 19–32)
Calcium: 8.8 mg/dL (ref 8.4–10.5)
Chloride: 106 meq/L (ref 96–112)
Creatinine, Ser: 1.7 mg/dL — ABNORMAL HIGH (ref 0.50–1.35)
GFR calc Af Amer: 48 mL/min — ABNORMAL LOW
GFR calc non Af Amer: 40 mL/min — ABNORMAL LOW
Glucose, Bld: 94 mg/dL (ref 70–99)
Potassium: 3.9 meq/L (ref 3.5–5.1)
Sodium: 139 meq/L (ref 135–145)
Total Bilirubin: 1.2 mg/dL (ref 0.3–1.2)
Total Protein: 5.9 g/dL — ABNORMAL LOW (ref 6.0–8.3)

## 2011-04-07 LAB — CBC
Platelets: 107 10*3/uL — ABNORMAL LOW (ref 150–400)
RDW: 15.4 % (ref 11.5–15.5)
WBC: 7 10*3/uL (ref 4.0–10.5)

## 2011-04-08 ENCOUNTER — Inpatient Hospital Stay (HOSPITAL_COMMUNITY): Payer: Medicare Other

## 2011-04-08 LAB — BASIC METABOLIC PANEL
BUN: 26 mg/dL — ABNORMAL HIGH (ref 6–23)
Calcium: 8.7 mg/dL (ref 8.4–10.5)
GFR calc non Af Amer: 43 mL/min — ABNORMAL LOW (ref >60)
Glucose, Bld: 97 mg/dL (ref 70–99)

## 2011-04-08 LAB — URINALYSIS, ROUTINE W REFLEX MICROSCOPIC
Bilirubin Urine: NEGATIVE
Glucose, UA: NEGATIVE mg/dL
Hgb urine dipstick: NEGATIVE
Protein, ur: NEGATIVE mg/dL
Specific Gravity, Urine: 1.014 (ref 1.005–1.030)
Urobilinogen, UA: 1 mg/dL (ref 0.0–1.0)

## 2011-04-08 NOTE — Op Note (Signed)
  NAMENOAAH, SCHUELE NO.:  [id number]  MEDICAL RECORD NO.:  [id number]  LOCATION:  XRAY                         FACILITY:  [facility]  PHYSICIAN:  Nicanor Alcon, M.D. 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  OPERATION PERFORMED:  Right lower lobe superior segmentectomy.  FIRST ASSISTANT:  Dominick PA-C.  ANESTHESIA:  General anesthesia.  DESCRIPTION OF PROCEDURE:  After percutaneous insertion of all monitoring lines, the patient underwent general anesthesia, turned to the right lateral thoracotomy position, was prepped and draped in usual sterile manner.  A dual-lumen tube was inserted.  Two trocar sites were made in the anterior and posterior axillary seventh and eighth intercostal space and zero degree scope was inserted and the lesion was seen in the superior segment of right lower lobe.  We then entered a sixth intercostal space, partially divided latissimus and reflecting the serratus anteriorly in the small TPA in the interspace.  Dissection was started in the fissure dissecting out the pulmonary artery and dissecting several eleven nodes and then dissecting out the superior portion of fissure and divided it with Covidien stapler that exposed the superior segmental artery and it was dissected free, stapled in the body with the Covidien purple stapler.  The bronchus to the superior segment as well as the superior segmental vein was dissected out, stapled and divided with Covidien blue stapler and then we completed segmentectomy, all across the base with black Covidien staplers.  Two areas were oversewn with 3-0 Vicryl and then the back of the adenocarcinoma and the margins were negative.  We  then dissected out some 10R nodes superiorly and 8 nodes down in the anterior pulmonary ligament.  The lung re- expanded.  Two chest tubes were brought in through the trocar sites at the right angle posteriorly and a straight chest tube anteriorly.  They were sutured in place with 0-silk.  Marcaine block was done in the usual fashion.  A single On-Q inserted in usual fashion.  ProGel was applied to the staple line and the chest was closed with three pericostal drilling through the seventh rib and passed around the sixth rib, #1 Vicryl muscle layer, 2-0 Vicryl subcutaneous tissue and Dermabond for the skin.  The patient was returned to recovery room in stable condition.     Nicanor Alcon, M.D.    DPB/MEDQ  D:  04/03/2011  T:  04/03/2011  Job:  [id number]  Electronically Signed by Ethelda Chick M.D. on 04/08/2011 01:48:38 PM

## 2011-04-09 ENCOUNTER — Encounter (HOSPITAL_COMMUNITY): Payer: Medicare Other

## 2011-04-09 ENCOUNTER — Inpatient Hospital Stay (HOSPITAL_COMMUNITY): Payer: Medicare Other

## 2011-04-09 LAB — BASIC METABOLIC PANEL
BUN: 21 mg/dL (ref 6–23)
CO2: 28 mEq/L (ref 19–32)
GFR calc non Af Amer: 45 mL/min — ABNORMAL LOW (ref >60)
Glucose, Bld: 102 mg/dL — ABNORMAL HIGH (ref 70–99)
Potassium: 4 mEq/L (ref 3.5–5.1)

## 2011-04-09 LAB — URINE CULTURE
Culture  Setup Time: 201209181642
Culture: NO GROWTH

## 2011-04-10 ENCOUNTER — Other Ambulatory Visit: Payer: Self-pay | Admitting: Thoracic Surgery

## 2011-04-10 DIAGNOSIS — C3430 Malignant neoplasm of lower lobe, unspecified bronchus or lung: Secondary | ICD-10-CM

## 2011-04-11 ENCOUNTER — Encounter (HOSPITAL_COMMUNITY): Payer: Medicare Other

## 2011-04-11 DIAGNOSIS — C3490 Malignant neoplasm of unspecified part of unspecified bronchus or lung: Secondary | ICD-10-CM

## 2011-04-14 ENCOUNTER — Encounter (HOSPITAL_COMMUNITY): Payer: Medicare Other

## 2011-04-15 ENCOUNTER — Ambulatory Visit
Admission: RE | Admit: 2011-04-15 | Discharge: 2011-04-15 | Disposition: A | Discharge status: Home / Self Care | Payer: Medicare Other | Source: Ambulatory Visit | Attending: Thoracic Surgery | Admitting: Thoracic Surgery

## 2011-04-15 ENCOUNTER — Ambulatory Visit (INDEPENDENT_AMBULATORY_CARE_PROVIDER_SITE_OTHER): Payer: Self-pay | Admitting: Thoracic Surgery

## 2011-04-15 ENCOUNTER — Encounter: Payer: Self-pay | Admitting: Thoracic Surgery

## 2011-04-15 VITALS — BP 128/71 | HR 81 | Resp 18 | Ht 72.0 in | Wt 264.0 lb

## 2011-04-15 DIAGNOSIS — C3430 Malignant neoplasm of lower lobe, unspecified bronchus or lung: Secondary | ICD-10-CM

## 2011-04-15 DIAGNOSIS — C3490 Malignant neoplasm of unspecified part of unspecified bronchus or lung: Secondary | ICD-10-CM

## 2011-04-15 NOTE — Progress Notes (Signed)
HPI the patient returns for followup after a right lower lobe superior segmentectomy.  The patient had non-small cell lung cancer stage IB. He is doing well overall. His incisions are well-healed. I removed his chest tube sutures. Chest x-ray revealed a improved aeration and normal postoperative changes. He had a fall at home. He sustained some bruises. He is ambulating with a walker. He is eating eating better and feeling stronger. He is having minimal chest pain. We will see him again in 2 weeks.   Current Outpatient Prescriptions  Medication Sig Dispense Refill  . acetaminophen (TYLENOL) 500 MG tablet Take 500 mg by mouth every 6 (six) hours as needed.        . ALPRAZolam (XANAX) 0.5 MG tablet Take 0.5 mg by mouth at bedtime as needed.        Marland Kitchen amLODipine (NORVASC) 5 MG tablet Take 5 mg by mouth daily.       Marland Kitchen aspirin 81 MG tablet Take 81 mg by mouth daily.        . clopidogrel (PLAVIX) 75 MG tablet Take 1 tablet (75 mg total) by mouth daily.  90 tablet  3  . doxycycline (VIBRAMYCIN) 50 MG capsule Take 50 mg by mouth daily.       . ergocalciferol (VITAMIN D2) 50000 UNITS capsule Take 50,000 Units by mouth every 30 (thirty) days.       . ferrous sulfate (FERROUSUL) 325 (65 FE) MG tablet Take 325 mg by mouth daily with breakfast.        . fexofenadine (ALLEGRA) 180 MG tablet Take 180 mg by mouth daily.        . fluticasone (VERAMYST) 27.5 MCG/SPRAY nasal spray Place 2 sprays into the nose daily.        . metoprolol (LOPRESSOR) 50 MG tablet Take 50 mg by mouth 2 (two) times daily.       Marland Kitchen NIASPAN 500 MG CR tablet Take 500 mg by mouth 2 (two) times daily.       Marland Kitchen NITROSTAT 0.4 MG SL tablet Place 0.4 mg under the tongue every 5 (five) minutes as needed.       . pravastatin (PRAVACHOL) 80 MG tablet Take 80 mg by mouth daily.       Marland Kitchen Propylhexedrine (BENZEDREX NA) as needed.       . tiotropium (SPIRIVA HANDIHALER) 18 MCG inhalation capsule Place 1 capsule (18 mcg total) into inhaler and inhale  daily.  30 capsule  2  . albuterol (PROAIR HFA) 108 (90 BASE) MCG/ACT inhaler Inhale 2 puffs into the lungs every 6 (six) hours as needed for wheezing.  1 Inhaler  1  . lisinopril (PRINIVIL,ZESTRIL) 2.5 MG tablet Take 2.5 mg by mouth daily.           Review of Systems: No change   Physical Exam  Cardiovascular: Normal rate, regular rhythm, normal heart sounds and intact distal pulses.   Pulmonary/Chest: Effort normal and breath sounds normal.     Diagnostic Tests: Chest x-ray showed improved aeration and normal postoperative changes   Impression: Status post right lower lobectomy for stage Ib adenocarcinoma of the lung  Plan: Followup in 2 weeks

## 2011-04-16 ENCOUNTER — Encounter (HOSPITAL_COMMUNITY): Payer: Medicare Other

## 2011-04-18 ENCOUNTER — Encounter (HOSPITAL_COMMUNITY): Payer: Medicare Other

## 2011-04-20 NOTE — Consult Note (Signed)
NAMEKAREAM, KISIEL NO.:  [id number]  MEDICAL RECORD NO.:  [id number]  LOCATION:                                 FACILITY:  PHYSICIAN:  Sol Blazing, M.D.DATE OF BIRTH:  1933-11-30  DATE OF CONSULTATION: DATE OF DISCHARGE:                                CONSULTATION   REQUESTING PHYSICIAN:  Nicanor Alcon, MD  REASON FOR CONSULTATION:  Acute kidney injury in a patient with chronic kidney disease stage III after VATS procedure for lung mass.  HISTORY:  Kyle [name] is a 72-year white male with history of hypertension, chronic kidney disease stage III followed by Nephrology at [facility], coronary artery disease with history of MI and history of AAA repair. The patient was recently diagnosed with the lung mass in his right lower lobe.  He had a biopsy which showed adenocarcinoma.  He was admitted on April 03, 2011, and underwent elective VATS/right lower lobe segmentectomy for removal of lobe lung mass.  Preoperative creatinine was 1.6, postoperative on April 04, 2011, was 1.4, on April 05, 2011, 1.5 and creatinine is up to 1.9 today.  Urine output has been good 2680 out yesterday and the patient has a chest tube in, but is awake, alert, and oriented and has no complaints.  Denies any shortness of breath or chest pain.  He is a vague historian.  PAST MEDICAL HISTORY: 1. COPD. 2. Tobacco use. 3. Hypertension. 4. Obstructive sleep apnea. 5. CKD stage III, baseline creatinine 1.3-1.5. 6. Coronary artery disease with a history of MI x2 and heart     catheterization. 7. Diabetes mellitus type 2. 8. Bladder cancer and BPH.  PAST SURGICAL HISTORY:  AAA repair elective, bladder cancer surgery with local treatment, TURP, and heart catheterization.  SOCIAL HISTORY:  Married, 2 children, retired.  Quit smoking 4 months ago.  MEDICATIONS AT HOME:  Pravachol, Niaspan, metoprolol, lisinopril, fluticasone, iron, Plavix, aspirin, vitamins, amlodipine,  Spiriva, and p.r.n. Xanax.  CURRENT MEDICATIONS: 1. Plavix 75 daily. 2. Spiriva 18 mcg daily. 3. Zocor 40 daily. 4. Niacin 1 g at night. 5. Lopressor 50 b.i.d. 6. Ferrous sulfate 325 daily. 7. Vibramycin 50 daily. 8. Aspirin 81 daily. 9. Normal saline at 20 mL an hour. 10.P.r.n. medications.  FAMILY HISTORY:  Noncontributory.  REVIEW OF SYSTEMS:  Denies any abdominal pain, nausea, vomiting, or diarrhea.  He is eating pretty well.  No chest pain, fever, chills, or sweats.  No joint pain or swelling, skin rash or itching, focal numbness or weakness.  PHYSICAL EXAMINATION:  VITAL SIGNS:  Temp 98.3, blood pressure 113/50, O2 sat 92% on 2 liters, and respirations 20. HEENT:  PERRLA, EOMI.  Throat is clear and moist. NECK:  Supple with flat neck veins.  No JVD. CHEST:  Clear on the left throughout.  Decreased at the right base. There is a right chest tube in place. CARDIAC:  Regular rate and rhythm without rub or gallop. ABDOMEN:  Obese, soft, and nontender.  No organomegaly.  No ascites. GU:  Normal male genitalia with Foley catheter draining dark amber urine. EXTREMITIES:  No ankle edema.  No ischemic change in the feet.  There is no lower extremity  or upper extremity edema. NEUROLOGICAL:  Alert and oriented x3.  No focal deficits.  LABORATORY DATA:  Sodium 139, potassium 4.2, BUN 28, creatinine 1.91, and glucose 115.  Hemoglobin 11, white blood count 7000, and platelets 95,000.  Urinalysis is normal.  Chest x-ray from today, bibasilar airspace disease with no CHF and right-sided chest tube.  IMPRESSION: 1. Acute on chronic renal failure, not severe, nonoliguric, probably     due to hemodynamic changes and fluid shifts after video-assisted     thoracoscopic surgery procedure.  Looks like he could use some IV     fluids.  We will increase normal saline.  Check urine sodium.     Decrease blood pressure meds.  Would expect improvement soon.     Doubt any significant degree of  ATN. 2. Hypertension - ACE inhibitor, Norvasc on hold.  Currently getting     metoprolol only. 3. Abdominal aortic aneurysm repair. 4. Chronic kidney disease, stage III, 1.3-1.5 creatinine baseline. 5. Status post right lower lobe segmentectomy on April 03, 2011,     for lung cancer. 6. History of coronary artery disease with myocardial infarction. 7. History of bladder cancer, status post surgical therapy. 8. History of benign prostatic hypertrophy, status post transurethral     resection of prostate. 9. Diet-controlled diabetes. 10.Chronic obstructive pulmonary disease. 11.Obstructive sleep apnea.  RECOMMENDATIONS:  See orders.     Sol Blazing, M.D.     RDS/MEDQ  D:  04/06/2011  T:  04/06/2011  Job:  [id number]  Electronically Signed by Roney Jaffe M.D. on 04/20/2011 06:25:47 PM

## 2011-04-21 ENCOUNTER — Encounter (HOSPITAL_COMMUNITY): Payer: Medicare Other

## 2011-04-21 HISTORY — PX: WEDGE RESECTION: SHX5070

## 2011-04-21 NOTE — Discharge Summary (Signed)
NAMEADEEB, LEGGE NO.:  [id number]  MEDICAL RECORD NO.:  [id number]  LOCATION:  [id number]                         FACILITY:  [facility]  PHYSICIAN:  Nicanor Alcon, M.D. DATE OF BIRTH:  May 30, 1934  DATE OF ADMISSION:  04/03/2011 DATE OF DISCHARGE:  04/09/2011                              DISCHARGE SUMMARY   HISTORY:  The patient is a 77 year old male who is referred to Dr. Arlyce Dice in consultation for a lung mass.  He was found to have a right lower lobe nodule at the time of an abdominal CT scan.  CT scan was being done for an enlarging renal cyst.  The nodule was 1.8 mm in size. It was positive on PET scan with an SUB of 7.5 within lateral basilar segments of the right lower lobe.  Biopsy revealed adenocarcinoma.  He is a former smoker having quit approximately 3 months ago.  He was admitted for hemoptysis following his biopsy, but this has subsequently cleared.  He does have a previous history of coronary artery disease and myocardial infarction.  He was admitted to this hospitalization for resection.  MEDICATIONS PRIOR TO ADMISSION: 1. Allegra 180 mg daily. 2. Alprazolam 0.5 mg daily p.r.n. 3. Amlodipine 5 mg daily. 4. Aspirin 81 mg daily. 5. Benzedrex nasal inhaler 1 puff both nostrils daily p.r.n. 6. Doxycycline 50 mg daily. 7. Fluticasone 2 sprays nasally p.r.n. 8. Iron 65 mg daily. 9. Metoprolol 50 mg twice daily. 10.Niaspan 500 mg SR tablet daily at bedtime. 11.Nitroglycerin p.r.n. 12.Plavix 75 mg daily with meals. 13.Pravachol 80 mg daily at bedtime. 14.Spiriva 18 mcg inhalation once daily. 15.Tylenol Extra Strength q.8 h. p.r.n. 16.Vitamin D2 50,000 units 1 capsule monthly. 17.Lisinopril 2.5 mg daily.  PAST MEDICAL HISTORY: 1. COPD. 2. Tobacco use. 3. Hypertension. 4. Chronic kidney disease, stage III with a baseline creatinine     between 1.3 and 1.5. 5. Coronary artery disease with myocardial infarction x2. 6. Diabetes mellitus type  2. 7. History of bladder cancer. 8. History of benign prostatic hyperplasia.  PAST SURGICAL HISTORY:  Resection grafting of abdominal aortic aneurysm, bladder cancer surgery, TURP, and heart catheterization.  SOCIAL HISTORY:  He is married, with 2two children.  He is retired.  He quit smoking approximately 3 months ago.  FAMILY HISTORY:  Noncontributory.  REVIEW OF SYSTEMS AND PHYSICAL EXAM:  Please see the history and physical done prior to admission.  HOSPITAL COURSE:  The patient was admitted electively and on April 03, 2011, he underwent a right video-assisted thoracoscopy with mini thoracotomy and right lower lobe superior segmentectomy with lymph node biopsy.  He tolerated the procedure well and was taken to a the Postanesthesia Care Unit in stable condition.  POSTOPERATIVE HOSPITAL COURSE:  The patient has overall progressed fairly nicely.  He did have some difficulty initially with renal dysfunction with exacerbation of his kidney disease.  He was seen in consultation by the nephrologist.  He was given initially some increased intravenous fluids.  His creatinine has subsequently improved with time. Most recent value on April 09, 2011, was 1.52 with a BUN of 21.  His ACE inhibitor has not been restarted, but may require as an outpatient.  This will be determined by his primary physician.  His pathology is consistent with adenocarcinoma.  It is invasive moderately differentiated 2.3 cm.  All lymph node samples were negative.  For full details of the pathology, please see the dictated report.  The patient has had some hypoxia postoperatively.  At times, he desaturates into the lower 80 percentile without oxygen.  It is not felt by Dr. Arlyce Dice that he will require home oxygen at this time.  His incision is healing well without evidence of infection.  He is slowly increasing activities using routine protocols.  He was seen on the morning of April 09, 2011, by Dr.  Arlyce Dice and it is felt that he is stable for discharge at this time.  INSTRUCTIONS:  The patient will receive written instructions in regard to medications, activity, diet, wound care, and followup.  Followup includes Dr. Arlyce Dice next week with a chest x-ray.  Medications at this time will be: 1. Ultram 50 mg 1-2 every 6 hours p.r.n. for pain. 2. Allegra 180 mg daily. 3. Alprazolam 0.5 mg daily p.r.n. 4. Amlodipine 5 mg daily. 5. Aspirin 81 mg daily. 6. Benzedrex 1 puff daily p.r.n. 7. Doxycycline 50 mg daily. 8. Fluticasone 2 sprays nasally p.r.n. 9. Iron 65 mg daily. 10.Metoprolol 50 mg twice daily. 11.Niaspan SR 500 mg daily at bedtime. 12.Nitroglycerin 0.4 mg sublingual q.5 minutes p.r.n. x3 for chest     pain. 13.Plavix 75 mg daily with meal. 14.Pravachol 80 mg daily. 15.Spiriva 18 mcg inhaled daily. 16.Tylenol p.r.n. 17.Vitamin D2 50,000 units monthly.  FINAL DIAGNOSES:  Adenocarcinoma of the right lower lobe status post segmentectomy.  Other diagnoses include acute exacerbation of chronic renal insufficiency.  His creatinine did peak at 1.9 during postoperative period, but now stable at 1.52.  Other diagnoses include chronic obstructive pulmonary disease with history of tobacco use, history of hypertension, history of coronary artery disease, history of myocardial infarction, history of obstructive sleep apnea on CPAP, history of diabetes mellitus type 2, history of bladder cancer, and history of benign prostatic hyperplasia.     John Giovanni, P.A.-C.   ______________________________ Nicanor Alcon, M.D.    Loren Racer  D:  04/09/2011  T:  04/09/2011  Job:  [id number]  Electronically Signed by Patrick Jupiter GOLD P.A.-C. on 04/15/2011 02:04:13 PM Electronically Signed by Ethelda Chick M.D. on 04/21/2011 12:50:38 PM

## 2011-04-22 ENCOUNTER — Other Ambulatory Visit: Payer: Self-pay | Admitting: Thoracic Surgery

## 2011-04-22 DIAGNOSIS — C3430 Malignant neoplasm of lower lobe, unspecified bronchus or lung: Secondary | ICD-10-CM

## 2011-04-23 ENCOUNTER — Encounter (HOSPITAL_COMMUNITY): Payer: Medicare Other

## 2011-04-25 ENCOUNTER — Encounter (HOSPITAL_COMMUNITY): Payer: Medicare Other

## 2011-04-28 ENCOUNTER — Encounter (HOSPITAL_COMMUNITY): Payer: Medicare Other

## 2011-04-29 ENCOUNTER — Encounter: Payer: Self-pay | Admitting: Cardiology

## 2011-04-29 ENCOUNTER — Ambulatory Visit (INDEPENDENT_AMBULATORY_CARE_PROVIDER_SITE_OTHER): Payer: Medicare Other | Admitting: Cardiology

## 2011-04-29 VITALS — BP 127/75 | HR 58 | Resp 18 | Ht 72.0 in | Wt 257.0 lb

## 2011-04-29 DIAGNOSIS — I2510 Atherosclerotic heart disease of native coronary artery without angina pectoris: Secondary | ICD-10-CM

## 2011-04-29 DIAGNOSIS — E785 Hyperlipidemia, unspecified: Secondary | ICD-10-CM

## 2011-04-29 DIAGNOSIS — C3490 Malignant neoplasm of unspecified part of unspecified bronchus or lung: Secondary | ICD-10-CM

## 2011-04-29 DIAGNOSIS — I10 Essential (primary) hypertension: Secondary | ICD-10-CM

## 2011-04-29 MED ORDER — LISINOPRIL 10 MG PO TABS: 10.0000 mg | ORAL_TABLET | Freq: Every day | ORAL | Status: DC

## 2011-04-29 NOTE — Assessment & Plan Note (Signed)
Control of hyperlipidemia is excellent; current therapy will be continued.

## 2011-04-29 NOTE — Assessment & Plan Note (Signed)
Cardiac disease was stable perioperatively.  Patient currently has no symptoms to suggest myocardial ischemia.  Current medical therapy will be continued.

## 2011-04-29 NOTE — Progress Notes (Signed)
HPI : Kyle [name] returns to the office following successful resection of an adenocarcinoma in the superior segment of the lower lobe of the right lung.  Postoperative course was complicated by transient renal insufficiency, but renal function has now returned to baseline.  Patient was initially weak and suffered a fall the first day home without severe injury.  He has subsequently received services from physical therapy, and strength has improved.  Has had a chronic mild cough with some sputum production.  Reevaluation by Dr. Arlyce Dice is planned for tomorrow.  Lisinopril was discontinued in hospital, and patient was advised to discuss resumption of that medication at outpatient followup.  Current Outpatient Prescriptions on File Prior to Visit  Medication Sig Dispense Refill  . acetaminophen (TYLENOL) 500 MG tablet Take 500 mg by mouth every 6 (six) hours as needed.        Marland Kitchen albuterol (PROAIR HFA) 108 (90 BASE) MCG/ACT inhaler Inhale 2 puffs into the lungs every 6 (six) hours as needed for wheezing.  1 Inhaler  1  . ALPRAZolam (XANAX) 0.5 MG tablet Take 0.5 mg by mouth at bedtime as needed.        Marland Kitchen aspirin 81 MG tablet Take 81 mg by mouth daily.        . clopidogrel (PLAVIX) 75 MG tablet Take 1 tablet (75 mg total) by mouth daily.  90 tablet  3  . doxycycline (VIBRAMYCIN) 50 MG capsule Take 50 mg by mouth daily.       . ergocalciferol (VITAMIN D2) 50000 UNITS capsule Take 50,000 Units by mouth every 30 (thirty) days.       . ferrous sulfate (FERROUSUL) 325 (65 FE) MG tablet Take 325 mg by mouth daily with breakfast.        . fexofenadine (ALLEGRA) 180 MG tablet Take 180 mg by mouth daily.        . fluticasone (VERAMYST) 27.5 MCG/SPRAY nasal spray Place 2 sprays into the nose daily.        . metoprolol (LOPRESSOR) 50 MG tablet Take 50 mg by mouth 2 (two) times daily.       Marland Kitchen NIASPAN 500 MG CR tablet Take 500 mg by mouth 2 (two) times daily.       Marland Kitchen NITROSTAT 0.4 MG SL tablet Place 0.4 mg under the  tongue every 5 (five) minutes as needed.       . pravastatin (PRAVACHOL) 80 MG tablet Take 80 mg by mouth daily.       Marland Kitchen Propylhexedrine (BENZEDREX NA) as needed.       . tiotropium (SPIRIVA HANDIHALER) 18 MCG inhalation capsule Place 1 capsule (18 mcg total) into inhaler and inhale daily.  30 capsule  2  . DISCONTD: lisinopril (PRINIVIL,ZESTRIL) 2.5 MG tablet Take 2.5 mg by mouth daily.           No Known Allergies    Past medical history, social history, and family history reviewed and updated.  ROS: Denies chest pain, dyspnea on exertion, orthopnea, PND, palpitations, lightheadedness or syncope.  PHYSICAL EXAM: BP 127/75  Pulse 58  Resp 18  Ht 6' (1.829 m)  Wt 116.574 kg (257 lb)  BMI 34.86 kg/m2  General-Well developed; no acute distress Body habitus-overweight Neck-No JVD; no carotid bruits Lungs-markedly decreased breath sounds at both bases; no rales appreciated; some prolongation of the expiratory phase without wheezing; right thoracotomy incision healing well Cardiovascular-normal PMI; split S1 and normal S2 Abdomen-normal bowel sounds; soft and non-tender without masses or organomegaly Musculoskeletal-No  deformities, no cyanosis or clubbing Neurologic-Normal cranial nerves; symmetric strength and tone; gait is steady with a slight tendency to deviate to the right Skin-Warm, no significant lesions Extremities-distal pulses intact; trace edema  ASSESSMENT AND PLAN:

## 2011-04-29 NOTE — Patient Instructions (Signed)
Your physician has recommended you make the following change in your medication: Stop taking Amlodipine, resume Lisinopril at 10 mg daily  Your physician recommends that you return for lab work in: 3 weeks  Your physician has requested that you regularly monitor and record your blood pressure readings at home. Please use the same machine at the same time of day to check your readings and record them to bring to your follow-up visit. Please return Blood pressure diary to this office in 2 weeks  Your physician recommends that you schedule a follow-up appointment in: 6 months

## 2011-04-29 NOTE — Assessment & Plan Note (Signed)
Patient has an excellent prognosis and is doing well postoperatively.  He will resume cardiac rehabilitation within the next few weeks.

## 2011-04-29 NOTE — Assessment & Plan Note (Signed)
Blood pressure control is good.  Lisinopril was previously utilized at a subtherapeutic dose.  This will be increased to 10 mg per day, which may allow discontinuation of amlodipine.  Patient will monitor blood pressures at home and return the list in 2 weeks at which time antihypertensive therapy will be readjusted.

## 2011-04-30 ENCOUNTER — Ambulatory Visit (INDEPENDENT_AMBULATORY_CARE_PROVIDER_SITE_OTHER): Payer: Self-pay | Admitting: Thoracic Surgery

## 2011-04-30 ENCOUNTER — Ambulatory Visit
Admission: RE | Admit: 2011-04-30 | Discharge: 2011-04-30 | Disposition: A | Discharge status: Home / Self Care | Payer: Medicare Other | Source: Ambulatory Visit | Attending: Thoracic Surgery | Admitting: Thoracic Surgery

## 2011-04-30 ENCOUNTER — Encounter: Payer: Self-pay | Admitting: Thoracic Surgery

## 2011-04-30 ENCOUNTER — Encounter (HOSPITAL_COMMUNITY): Payer: Medicare Other

## 2011-04-30 VITALS — BP 109/64 | HR 52 | Resp 20 | Ht 72.0 in | Wt 258.0 lb

## 2011-04-30 DIAGNOSIS — C3490 Malignant neoplasm of unspecified part of unspecified bronchus or lung: Secondary | ICD-10-CM

## 2011-04-30 DIAGNOSIS — C3430 Malignant neoplasm of lower lobe, unspecified bronchus or lung: Secondary | ICD-10-CM

## 2011-04-30 NOTE — Progress Notes (Signed)
HPI patient is doing much better. Physical therapy is helped him as well as his balance. He has stopped using his walker. Chest x-ray showed normal postoperative changes. We plan to refer him to an oncologist for a stage IA cancer. He is at his incision is well healed. We'll see him back again in 6 weeks with a chest x-ray   Current Outpatient Prescriptions  Medication Sig Dispense Refill  . acetaminophen (TYLENOL) 500 MG tablet Take 500 mg by mouth every 6 (six) hours as needed.        Marland Kitchen albuterol (PROAIR HFA) 108 (90 BASE) MCG/ACT inhaler Inhale 2 puffs into the lungs every 6 (six) hours as needed for wheezing.  1 Inhaler  1  . ALPRAZolam (XANAX) 0.5 MG tablet Take 0.5 mg by mouth at bedtime as needed.        Marland Kitchen aspirin 81 MG tablet Take 81 mg by mouth daily.        . Citalopram Hydrobromide (CELEXA PO) Take by mouth daily.        . clopidogrel (PLAVIX) 75 MG tablet Take 1 tablet (75 mg total) by mouth daily.  90 tablet  3  . doxycycline (VIBRAMYCIN) 50 MG capsule Take 50 mg by mouth daily.       . ergocalciferol (VITAMIN D2) 50000 UNITS capsule Take 50,000 Units by mouth every 30 (thirty) days.       . ferrous sulfate (FERROUSUL) 325 (65 FE) MG tablet Take 325 mg by mouth daily with breakfast.        . fexofenadine (ALLEGRA) 180 MG tablet Take 180 mg by mouth daily.        . fluticasone (VERAMYST) 27.5 MCG/SPRAY nasal spray Place 2 sprays into the nose daily.        Marland Kitchen lisinopril (PRINIVIL,ZESTRIL) 10 MG tablet Take 1 tablet (10 mg total) by mouth daily. To replace Amlodipine  90 tablet  1  . metoprolol (LOPRESSOR) 50 MG tablet Take 50 mg by mouth 2 (two) times daily.       Marland Kitchen NIASPAN 500 MG CR tablet Take 500 mg by mouth 2 (two) times daily.       Marland Kitchen NITROSTAT 0.4 MG SL tablet Place 0.4 mg under the tongue every 5 (five) minutes as needed.       . pravastatin (PRAVACHOL) 80 MG tablet Take 80 mg by mouth daily.       Marland Kitchen Propylhexedrine (BENZEDREX NA) as needed.       . tiotropium (SPIRIVA  HANDIHALER) 18 MCG inhalation capsule Place 1 capsule (18 mcg total) into inhaler and inhale daily.  30 capsule  2     Review of Systems:unchanged  Physical Exam  Cardiovascular: Normal rate, regular rhythm and normal heart sounds.   Pulmonary/Chest: Effort normal and breath sounds normal. No respiratory distress.     Diagnostic Tests: Chest x-ray showed normal postoperative changes   Impression: Status post right lower lobe superior segmentectomy for non-small cell lung cancer stage I   Plan followup in 6 weeks referred to medical oncologist

## 2011-05-02 ENCOUNTER — Encounter (HOSPITAL_COMMUNITY): Payer: Medicare Other

## 2011-05-05 ENCOUNTER — Encounter (HOSPITAL_COMMUNITY): Payer: Medicare Other

## 2011-05-07 ENCOUNTER — Encounter (HOSPITAL_COMMUNITY): Payer: Medicare Other

## 2011-05-09 ENCOUNTER — Encounter: Payer: Self-pay | Admitting: *Deleted

## 2011-05-09 ENCOUNTER — Encounter (HOSPITAL_COMMUNITY): Payer: Medicare Other

## 2011-05-12 ENCOUNTER — Encounter (HOSPITAL_COMMUNITY): Payer: Medicare Other

## 2011-05-14 ENCOUNTER — Encounter (HOSPITAL_COMMUNITY): Payer: Medicare Other

## 2011-05-16 ENCOUNTER — Encounter (HOSPITAL_COMMUNITY): Payer: Medicare Other

## 2011-05-16 ENCOUNTER — Other Ambulatory Visit: Payer: Self-pay | Admitting: Cardiology

## 2011-05-17 LAB — COMPREHENSIVE METABOLIC PANEL
ALT: 10 U/L (ref 0–53)
AST: 11 U/L (ref 0–37)
Alkaline Phosphatase: 57 U/L (ref 39–117)
CO2: 26 mEq/L (ref 19–32)
Sodium: 141 mEq/L (ref 135–145)
Total Bilirubin: 1.2 mg/dL (ref 0.3–1.2)
Total Protein: 6.4 g/dL (ref 6.0–8.3)

## 2011-05-19 ENCOUNTER — Encounter (HOSPITAL_COMMUNITY)
Admission: RE | Admit: 2011-05-19 | Discharge: 2011-05-19 | Disposition: A | Discharge status: Home / Self Care | Payer: Medicare Other | Source: Ambulatory Visit | Attending: Cardiology | Admitting: Cardiology

## 2011-05-19 DIAGNOSIS — Z5189 Encounter for other specified aftercare: Secondary | ICD-10-CM | POA: Insufficient documentation

## 2011-05-19 DIAGNOSIS — I2510 Atherosclerotic heart disease of native coronary artery without angina pectoris: Secondary | ICD-10-CM | POA: Insufficient documentation

## 2011-05-19 DIAGNOSIS — I252 Old myocardial infarction: Secondary | ICD-10-CM | POA: Insufficient documentation

## 2011-05-21 ENCOUNTER — Encounter (HOSPITAL_COMMUNITY)
Admission: RE | Admit: 2011-05-21 | Discharge: 2011-05-21 | Disposition: A | Discharge status: Home / Self Care | Payer: Medicare Other | Source: Ambulatory Visit | Attending: Cardiology | Admitting: Cardiology

## 2011-05-23 ENCOUNTER — Encounter (HOSPITAL_COMMUNITY)
Admission: RE | Admit: 2011-05-23 | Discharge: 2011-05-23 | Disposition: A | Discharge status: Home / Self Care | Payer: Medicare Other | Source: Ambulatory Visit | Attending: Cardiology | Admitting: Cardiology

## 2011-05-23 DIAGNOSIS — C3490 Malignant neoplasm of unspecified part of unspecified bronchus or lung: Secondary | ICD-10-CM | POA: Insufficient documentation

## 2011-05-23 DIAGNOSIS — D696 Thrombocytopenia, unspecified: Secondary | ICD-10-CM | POA: Insufficient documentation

## 2011-05-26 ENCOUNTER — Encounter (HOSPITAL_BASED_OUTPATIENT_CLINIC_OR_DEPARTMENT_OTHER): Payer: Medicare Other | Admitting: Oncology

## 2011-05-26 ENCOUNTER — Encounter (HOSPITAL_COMMUNITY)
Admission: RE | Admit: 2011-05-26 | Discharge: 2011-05-26 | Disposition: A | Discharge status: Home / Self Care | Payer: Medicare Other | Source: Ambulatory Visit | Attending: Cardiology | Admitting: Cardiology

## 2011-05-26 ENCOUNTER — Encounter (HOSPITAL_COMMUNITY): Payer: Self-pay | Admitting: Oncology

## 2011-05-26 ENCOUNTER — Ambulatory Visit (HOSPITAL_COMMUNITY): Payer: Medicare Other | Admitting: Oncology

## 2011-05-26 DIAGNOSIS — D473 Essential (hemorrhagic) thrombocythemia: Secondary | ICD-10-CM

## 2011-05-26 DIAGNOSIS — N289 Disorder of kidney and ureter, unspecified: Secondary | ICD-10-CM

## 2011-05-26 DIAGNOSIS — D696 Thrombocytopenia, unspecified: Secondary | ICD-10-CM

## 2011-05-26 DIAGNOSIS — C3430 Malignant neoplasm of lower lobe, unspecified bronchus or lung: Secondary | ICD-10-CM

## 2011-05-26 DIAGNOSIS — Z8551 Personal history of malignant neoplasm of bladder: Secondary | ICD-10-CM

## 2011-05-26 DIAGNOSIS — C3490 Malignant neoplasm of unspecified part of unspecified bronchus or lung: Secondary | ICD-10-CM

## 2011-05-26 LAB — CBC
HCT: 38.4 % — ABNORMAL LOW (ref 39.0–52.0)
MCH: 29.9 pg (ref 26.0–34.0)
MCHC: 32 g/dL (ref 30.0–36.0)
MCV: 93.2 fL (ref 78.0–100.0)
Platelets: 122 10*3/uL — ABNORMAL LOW (ref 150–400)
RDW: 16 % — ABNORMAL HIGH (ref 11.5–15.5)
WBC: 6 10*3/uL (ref 4.0–10.5)

## 2011-05-26 NOTE — Progress Notes (Signed)
This office note has been dictated.

## 2011-05-26 NOTE — Patient Instructions (Signed)
[facility]  Discharge Instructions  RECOMMENDATIONS MADE BY THE CONSULTANT AND ANY TEST RESULTS WILL BE SENT TO YOUR REFERRING DOCTOR.   EXAM FINDINGS BY MD TODAY AND SIGNS AND SYMPTOMS TO REPORT TO CLINIC OR PRIMARY MD:See Dr. Iona Hansen about your vision.  Your platelet count is low and we will do some additional labs today to see if we can sort out what's going on.  Need to stop smoking.  MEDICATIONS PRESCRIBED: none      SPECIAL INSTRUCTIONS/FOLLOW-UP: Xray Studies Needed in 6 months and Return to Clinic on after scans.   I acknowledge that I have been informed and understand all the instructions given to me and received a copy. I do not have any more questions at this time, but understand that I may call the Specialty Clinic at [facility] at [phone] during business hours should I have any further questions or need assistance in obtaining follow-up care.    __________________________________________  _____________  __________ Signature of Patient or Authorized Representative            Date                   Time    __________________________________________ Nurse's Signature

## 2011-05-27 LAB — IRON AND TIBC
Saturation Ratios: 20 % (ref 20–55)
UIBC: 197 ug/dL (ref 125–400)

## 2011-05-27 NOTE — Progress Notes (Signed)
CC:   Dr. Mickie Hillier Kathee Delton, MD,FCCP Nicanor Alcon, M.D. Drummond Rosana Hoes, M.D.  DIAGNOSES: 1. Stage IA (T1b N0 M0) non-small-cell lung cancer, 2.3 cm in     size, status post resection by Dr. Baldemar Friday and this was on     April 03, 2011.  This was a moderately differentiated     adenocarcinoma, no lymphovascular invasion, no pleural involvement,     margins all clear and 6 lymph nodes all examined were negative for     involvement giving him ultimately a stage IA (a T1b N0 M0 stage). 2. Thrombocytopenia, unclear etiology. 3. History of bladder cancer treated by Dr. Laurence Ferrari at [facility]     years ago. 4. History of bladder cancer treated with BCG by Dr. Katrine Coho     years ago and then he had a recurrence x2 for which he saw Dr.     Laurence Ferrari. 5. History of mild renal insufficiency, which he sees a nephrologist     at [facility]. 6. History of myocardial infarction x2, one 25 years ago, one in May     2012.  His cardiologist is Dr. Lattie Haw and associates. 7. History of kidney stone disease. 8. History of abdominal aortic aneurysm, repaired about 5 years ago at     [facility]. 9. Large abdominal hernia, probably secondary to the above-mentioned     surgery. 10.Longstanding smoking history of chronic lung disease and high     pulmonary hypertension.  He smoked a pack a day or more starting at     the age 79 and quit just when he had his heart attack in May of     this year. 11.History of difficulty with what he states is double vision     intermittently for 4 years.  I have referred him to his eye doctor,     Dr. Maye Hides and he states that he will see him this week or     next week. 12.Obesity. 13.History of a skin cancer in the past which he has seen Dr. Denna Haggard,     his wife states. This is a very pleasant gentleman who is here today for evaluation and consultation of his early stage IA non-small cell lung cancer.  It was found  serendipitously when he came to the hospital here for kidney stone evaluation.  That was in August of this year.  He had a biopsy which confirmed the diagnosis.  That biopsy was done on the 16th of August.  He is here today for consultation accompanied by his wife.  He is a pleasant gentleman in no acute distress.  REVIEW OF SYSTEMS:  His oncologic review of systems is negative.  He has lost "a few pounds" since his heart attack in May.  He was unaware of any pulmonary symptomatology that was new or different for him in the previous 6 months.  He has 3 siblings, all of whom are nonsmokers, never had cancer.  His father was 39 when he died of abdominal aortic aneurysm rupture.  His mother was 69 when she died of sepsis.  He and his wife have children in good health.  They are nonsmokers.  I believe there are 3 children.  SOCIAL HISTORY:  Does not drink.  He used to smoke, as I mentioned.  He is retired from [employer] at age 72.  He has been at home enjoying his life.  He does garden during the summer months, but of  course continued to smoke all these years in spite of a heart attack 25 years ago.  PHYSICAL EXAM:  He is a pleasant gentleman in no acute distress.  Weight is 260 pounds.  He is 5 feet 11-1/2 inches tall.  BMI is 35.9, blood pressure 149/74, right arm, sitting position, pulse right around 56, it is regular, respirations 18-20, not labored at rest.  He is afebrile. He denies any pain presently.  Scars on the right chest are well-healed. Lungs:  Show markedly diminished breath sounds.  He has no obvious lymphadenopathy in any location.  He has a huge abdominal hernia, in fact he has 2, one more superiorly and to the right.  This one that is huge is centrally and more left-sided.  It measures at least 10 x 15 cm, the larger one.  Bowel sounds are normal, though.  He has no obvious hepatosplenomegaly.  Again, no adenopathy in any location.  No thyromegaly.  Teeth in very  good repair.  Tongue is normal in the midline.  Throat is clear.  Pupils appear to be equally round, reactive to light and I cannot document any difference in his visual following of a light.  So I do not see that his EOMs are out of synchronous mode. His facial symmetry appears intact.  Skin:  He has some benign findings only.  He has no leg edema.  Pulses are 2+ dorsalis pedis, but posterior tibialis pulses are difficult to impossible to feel.  He is right- handed.  Nails are unremarkable.  He has good color.  Again, no leg edema.  No arm edema.  He is alert.  He is oriented.  From the standpoint of his early stage IA lung cancer.  There is no adjuvant chemotherapy that is indicated at this time, but he does need followup CT scans of the chest every 6 months, probably for 3-5 years and then annually thereafter.  From the standpoint of his thrombocytopenia, I think he needs his iron stores checked, [id number] and folic acid levels.  He is on a host of medications of which 1 or 2 might be associated thrombocytopenia but I do not think we can easily stop them with his heart disease history, etc.  His platelets for at least 6 months, thought, have been stable in the 95,000-120,000 range.  From the standpoint of his bladder cancer, he needs followup with Dr. Rosana Hoes and I have encouraged that. I have looked at his CT scans.  He has no splenomegaly.  He does not have necessarily hepatomegaly.  He has no adenopathy.  He has, of course, multiple medical issues, multiple medications, one of which might be causing mild thrombocytopenia but he certainly could have ITP of adults as well, but this just needs to be observed.  So we will see him back in 6 months.  We will draw some blood today and call him if any results are abnormal.    ______________________________ Gaston Islam. Tressie Stalker, MD ESN/MEDQ  D:  05/26/2011  T:  05/26/2011  Job:  [id number]

## 2011-05-28 ENCOUNTER — Encounter (HOSPITAL_COMMUNITY)
Admission: RE | Admit: 2011-05-28 | Discharge: 2011-05-28 | Disposition: A | Discharge status: Home / Self Care | Payer: Medicare Other | Source: Ambulatory Visit | Attending: Cardiology | Admitting: Cardiology

## 2011-05-30 ENCOUNTER — Encounter (HOSPITAL_COMMUNITY)
Admission: RE | Admit: 2011-05-30 | Discharge: 2011-05-30 | Disposition: A | Discharge status: Home / Self Care | Payer: Medicare Other | Source: Ambulatory Visit | Attending: Cardiology | Admitting: Cardiology

## 2011-06-02 ENCOUNTER — Encounter (HOSPITAL_COMMUNITY)
Admission: RE | Admit: 2011-06-02 | Discharge: 2011-06-02 | Disposition: A | Discharge status: Home / Self Care | Payer: Medicare Other | Source: Ambulatory Visit | Attending: Cardiology | Admitting: Cardiology

## 2011-06-04 ENCOUNTER — Encounter (HOSPITAL_COMMUNITY)
Admission: RE | Admit: 2011-06-04 | Discharge: 2011-06-04 | Disposition: A | Discharge status: Home / Self Care | Payer: Medicare Other | Source: Ambulatory Visit | Attending: Cardiology | Admitting: Cardiology

## 2011-06-05 DIAGNOSIS — N320 Bladder-neck obstruction: Secondary | ICD-10-CM | POA: Insufficient documentation

## 2011-06-06 ENCOUNTER — Encounter (HOSPITAL_COMMUNITY): Payer: Medicare Other

## 2011-06-06 ENCOUNTER — Other Ambulatory Visit: Payer: Self-pay | Admitting: Thoracic Surgery

## 2011-06-06 DIAGNOSIS — C3490 Malignant neoplasm of unspecified part of unspecified bronchus or lung: Secondary | ICD-10-CM

## 2011-06-09 ENCOUNTER — Ambulatory Visit (INDEPENDENT_AMBULATORY_CARE_PROVIDER_SITE_OTHER): Payer: Medicare Other | Admitting: Pulmonary Disease

## 2011-06-09 ENCOUNTER — Encounter: Payer: Self-pay | Admitting: Pulmonary Disease

## 2011-06-09 ENCOUNTER — Encounter (HOSPITAL_COMMUNITY): Payer: Medicare Other

## 2011-06-09 VITALS — BP 110/68 | HR 65 | Temp 97.9°F | Ht 71.0 in | Wt 265.0 lb

## 2011-06-09 DIAGNOSIS — J449 Chronic obstructive pulmonary disease, unspecified: Secondary | ICD-10-CM

## 2011-06-09 NOTE — Assessment & Plan Note (Signed)
The patient is fairly stable from a COPD standpoint.  He has not smoked since May of this year, and I have congratulated him on this.  He is not sure the Spiriva is helping him, however his family member has seen a big difference in his exertional tolerance.  I am willing to give him a trial of a LABA/ICS for 4 weeks, and let him make the decision which he prefers.  I also recommended he work on weight loss and some type of conditioning program.

## 2011-06-09 NOTE — Patient Instructions (Signed)
Will try symbicort 160/4.5  2 inhalations each am and pm for the next 4 weeks.  Keep mouth rinsed well after using. Stop spiriva as a trial, but if you feel you do better on spiriva, go back to it.  Make a decision after 4 weeks which helps you the most Stay off cigarettes.  You are doing great. Work on weight reduction and getting exercise. followup with me in 7mos if doing well.

## 2011-06-09 NOTE — Progress Notes (Signed)
  Subjective:    Patient ID: Kyle [name], male    DOB: March 04, 1934, 77 y.o.   MRN: [id number]  HPI The patient comes in today for followup of his known moderate emphysema.  He is status post resection of his adenocarcinoma, and has done well postoperatively.  He has gone without smoking since May of this year, and feels that he is getting stronger with respect to his exertional tolerance.  He has not had a recent acute exacerbation, and has already had his flu shot this year.  He denies any significant cough or congestion.  His only question is that he feels Spiriva is not helping his breathing that much, but his family member disagrees and feels that he is much better since being on the medication.   Review of Systems  Constitutional: Negative for fever and unexpected weight change.  HENT: Negative for ear pain, nosebleeds, congestion, sore throat, rhinorrhea, sneezing, trouble swallowing, dental problem, postnasal drip and sinus pressure.   Eyes: Negative for redness and itching.  Respiratory: Positive for cough and shortness of breath. Negative for chest tightness and wheezing.   Cardiovascular: Negative for palpitations and leg swelling.  Gastrointestinal: Negative for nausea and vomiting.  Genitourinary: Negative for dysuria.  Musculoskeletal: Negative for joint swelling.  Skin: Negative for rash.  Neurological: Negative for headaches.  Hematological: Bruises/bleeds easily.  Psychiatric/Behavioral: Negative for dysphoric mood. The patient is not nervous/anxious.        Objective:   Physical Exam Overweight male in no acute distress nose without purulence or discharge noted Chest with a few rhonchi, adequate air flow, no wheezes Cardiac exam sounds are regular, controlled ventricular response. Lower extremities with very mild edema, no cyanosis noted Alert and oriented, moves all 4 extremities.       Assessment & Plan:

## 2011-06-11 ENCOUNTER — Encounter: Payer: Self-pay | Admitting: Thoracic Surgery

## 2011-06-11 ENCOUNTER — Ambulatory Visit
Admission: RE | Admit: 2011-06-11 | Discharge: 2011-06-11 | Disposition: A | Discharge status: Home / Self Care | Payer: Medicare Other | Source: Ambulatory Visit | Attending: Thoracic Surgery | Admitting: Thoracic Surgery

## 2011-06-11 ENCOUNTER — Ambulatory Visit: Payer: Medicare Other | Admitting: Thoracic Surgery

## 2011-06-11 ENCOUNTER — Ambulatory Visit (INDEPENDENT_AMBULATORY_CARE_PROVIDER_SITE_OTHER): Payer: Self-pay | Admitting: Thoracic Surgery

## 2011-06-11 ENCOUNTER — Encounter (HOSPITAL_COMMUNITY): Payer: Medicare Other

## 2011-06-11 VITALS — BP 127/68 | HR 52 | Resp 16 | Ht 72.0 in | Wt 258.0 lb

## 2011-06-11 DIAGNOSIS — C3490 Malignant neoplasm of unspecified part of unspecified bronchus or lung: Secondary | ICD-10-CM

## 2011-06-11 DIAGNOSIS — C3430 Malignant neoplasm of lower lobe, unspecified bronchus or lung: Secondary | ICD-10-CM

## 2011-06-11 NOTE — Progress Notes (Signed)
HPI the patient returns for final followup. Chest x-ray shows normal postoperative changes he is being followed by Dr. Fatima Sanger. His incisions are well-healed. He is had no more pulmonary problems. We will see him back again as needed  Current Outpatient Prescriptions  Medication Sig Dispense Refill  . acetaminophen (TYLENOL) 500 MG tablet Take 500 mg by mouth every 6 (six) hours as needed.        Marland Kitchen albuterol (PROAIR HFA) 108 (90 BASE) MCG/ACT inhaler Inhale 2 puffs into the lungs every 6 (six) hours as needed for wheezing.  1 Inhaler  1  . ALPRAZolam (XANAX) 0.5 MG tablet Take 0.5 mg by mouth at bedtime as needed.        Marland Kitchen aspirin 81 MG tablet Take 81 mg by mouth daily.        . Citalopram Hydrobromide (CELEXA PO) Take 20 mg by mouth daily.       . clopidogrel (PLAVIX) 75 MG tablet Take 1 tablet (75 mg total) by mouth daily.  90 tablet  3  . doxycycline (VIBRAMYCIN) 50 MG capsule Take 50 mg by mouth daily.       . ergocalciferol (VITAMIN D2) 50000 UNITS capsule Take 50,000 Units by mouth every 30 (thirty) days.       . ferrous sulfate (FERROUSUL) 325 (65 FE) MG tablet Take 325 mg by mouth daily with breakfast.        . fexofenadine (ALLEGRA) 180 MG tablet Take 180 mg by mouth daily.        . fluticasone (VERAMYST) 27.5 MCG/SPRAY nasal spray Place 2 sprays into the nose daily.        Marland Kitchen lisinopril (PRINIVIL,ZESTRIL) 10 MG tablet Take 1 tablet (10 mg total) by mouth daily. To replace Amlodipine  90 tablet  1  . NIASPAN 500 MG CR tablet Take 1,000 mg by mouth at bedtime.       Marland Kitchen NITROSTAT 0.4 MG SL tablet Place 0.4 mg under the tongue every 5 (five) minutes as needed.       . pravastatin (PRAVACHOL) 80 MG tablet Take 80 mg by mouth daily.       Marland Kitchen Propylhexedrine (BENZEDREX NA) as needed.       . tiotropium (SPIRIVA HANDIHALER) 18 MCG inhalation capsule Place 1 capsule (18 mcg total) into inhaler and inhale daily.  30 capsule  2     Review of Systems: Unchanged   Physical Exam lungs were  clear to auscultation percussion incision is well-healed   Diagnostic Tests: Chest x-ray showed normal postoperative changes on the right   Impression: Status post stage Ib non-small cell lung cancer status post right lower lobe superior segmentectomy   Plan: Followup by an oncologist return when necessary

## 2011-06-13 ENCOUNTER — Encounter (HOSPITAL_COMMUNITY): Payer: Medicare Other

## 2011-06-13 NOTE — Progress Notes (Signed)
Cardiac Rehabilitation Program Progress Report   Orientation:  12/31/2010 Graduate Date:  tbd Discharge Date:  tbd # of sessions completed: 18  Cardiologist: Loralie Champagne Family MD: Mickie Hillier Class Time:  06:45  A.  Exercise Program:  Tolerates exercise @ 2.3 METS for 15 minutes  B.  Mental Health:  Good mental attitude  C.  Education/Instruction/Skills  Knows THR for exercise and Uses Perceived Exertion Scale and/or Dyspnea Scale  Uses Perceived Exertion Scale and/or Dyspnea Scale  D.  Nutrition/Weight Control/Body Composition:  Adherence to prescribed nutrition program: good   *This section completed by Derek Mound, Reed Pandy, RD, LDN, CDE  E.  Blood Lipids    Lab Results  Component Value Date   CHOL 115 12/20/2010     Lab Results  Component Value Date   TRIG 165* 12/20/2010     Lab Results  Component Value Date   HDL 25* 12/20/2010     Lab Results  Component Value Date   CHOLHDL 4.6 12/20/2010     No results found for this basename: LDLDIRECT      F.  Lifestyle Changes:  Making positive lifestyle changes  G.  Symptoms noted with exercise:  Asymptomatic  Report Completed By:  Norlene Duel   Comments:  This is patients halfway report. He achieved a peak mets of 2.3. He is going out for surgery. Will return when he is released, he is very motivated to finish the program.

## 2011-06-13 NOTE — Progress Notes (Signed)
Cardiac Rehabilitation Program Progress Report   Orientation: 12/610 Graduate Date:  tbd Discharge Date:  tbd # of sessions completed: 3  Cardiologist: Loralie Champagne Family MD:  Mickie Hillier Class Time:  06:45  A.  Exercise Program:  Tolerates exercise @ 2.1 METS for 15 minutes  B.  Mental Health:  Good mental attitude  C.  Education/Instruction/Skills  Knows THR for exercise  Uses Perceived Exertion Scale and/or Dyspnea Scale  D.  Nutrition/Weight Control/Body Composition:  Adherence to prescribed nutrition program: good   *This section completed by Derek [place], Reed Pandy, RD, LDN, CDE  E.  Blood Lipids    Lab Results  Component Value Date   CHOL 115 12/20/2010     Lab Results  Component Value Date   TRIG 165* 12/20/2010     Lab Results  Component Value Date   HDL 25* 12/20/2010     Lab Results  Component Value Date   CHOLHDL 4.6 12/20/2010     No results found for this basename: LDLDIRECT      F.  Lifestyle Changes:  Making positive lifestyle changes  G.  Symptoms noted with exercise:  Asymptomatic  Report Completed By:  Norlene Duel   Comments:  Patient completed his first week. He achieved aa peak mets of2.1. He is very motivated.

## 2011-06-13 NOTE — Progress Notes (Signed)
Cardiac Rehabilitation Program Progress Report   Orientation: 12/31/2010 Graduate Date:  06/04/2011 Discharge Date:  06/04/2011 # of sessions completed: 36  Cardiologist: Loralie Champagne Family MD:  Mickie Hillier Class Time:  06:45  A.  Exercise Program:  Tolerates exercise @ 2.2 METS for 15 minutes  B.  Mental Health:  Good mental attitude  C.  Education/Instruction/Skills  Knows THR for exercise and Uses Perceived Exertion Scale and/or Dyspnea Scale  Attended 36 education classes  D.  Nutrition/Weight Control/Body Composition:  Adherence to prescribed nutrition program: good   *This section completed by Derek Mound, Reed Pandy, RD, LDN, CDE  E.  Blood Lipids    Lab Results  Component Value Date   CHOL 115 12/20/2010     Lab Results  Component Value Date   TRIG 165* 12/20/2010     Lab Results  Component Value Date   HDL 25* 12/20/2010     Lab Results  Component Value Date   CHOLHDL 4.6 12/20/2010     No results found for this basename: LDLDIRECT      F.  Lifestyle Changes:  Making positive lifestyle changes  G.  Symptoms noted with exercise:  Asymptomatic  Report Completed By:  Norlene Duel   Comments:  Mr. Rosencrance has progressed nicely to 30 minutes of aerobic execise @  Max MET level 89f 2.2 and 10 min.strength and flexibility exercises. All patient vital signs are WNL. Patient has met the [place]. DC instructions has been reviewed in detail; verbalized understanding. Patient plans to join the local gym for follow up exercises. Cardiac Rehab staff will make follow up calls at 3month, 6 months, and 1 year. Patient had no C/O any abnormal signs or symptoms.

## 2011-06-16 ENCOUNTER — Encounter (HOSPITAL_COMMUNITY): Payer: Medicare Other

## 2011-06-17 ENCOUNTER — Encounter: Payer: Self-pay | Admitting: Cardiology

## 2011-06-18 ENCOUNTER — Encounter (HOSPITAL_COMMUNITY): Payer: Medicare Other

## 2011-07-03 ENCOUNTER — Telehealth: Payer: Self-pay | Admitting: Pulmonary Disease

## 2011-07-03 NOTE — Telephone Encounter (Signed)
Called and spoke with Kyle [name] [facility] Case Manager.  She states pt was seen by [facility] 06/09/11.  Approx 1 week later pt started to notice blood streaks on pillow when he would wake up in the morning.  This happened for 3 to 4 days.  Pt then called his pcp, Dr. [name] who prescribed a 10 day course of Keflex which pt took a finished.  Pt is still continuing to cough up blood and Kyle [name] states the pt's wife is having to change the bed sheets daily d/t steaks of blood on pillow case.  Kyle [name] went to evaluate pt today and states lungs sound clear and o2 sats between 97 and 98%.  Pt had a sample of sputum that he had coughed up for Kyle [name] to see.  She states she noticed an approx tbsp of thick clear sputum with brb streaks in it.  She recommended pt call either [facility] and Dr. [name] office but pt requested that [facility] call [facility] instead.  [place] has an opening tomorrow at 3:30 which I have held (ok'd by TD)  ATC pt at both home # (which is busy) and cell # (which there was NA and no option to leave message)  Will have to call pt back tomorrow morning to office appt.

## 2011-07-03 NOTE — Telephone Encounter (Signed)
Kyle [name] returned call.  [name]

## 2011-07-03 NOTE — Telephone Encounter (Signed)
LMOM  For Kyle [name] TCB

## 2011-07-03 NOTE — Telephone Encounter (Signed)
[name]  RETURNED CALL.

## 2011-07-03 NOTE — Telephone Encounter (Signed)
Pt aware of appt with kc for tomorrow. Advised to go to er if symptoms worsen

## 2011-07-04 ENCOUNTER — Ambulatory Visit (INDEPENDENT_AMBULATORY_CARE_PROVIDER_SITE_OTHER): Payer: Medicare Other | Admitting: Pulmonary Disease

## 2011-07-04 ENCOUNTER — Encounter: Payer: Self-pay | Admitting: Pulmonary Disease

## 2011-07-04 ENCOUNTER — Ambulatory Visit (INDEPENDENT_AMBULATORY_CARE_PROVIDER_SITE_OTHER)
Admission: RE | Admit: 2011-07-04 | Discharge: 2011-07-04 | Disposition: A | Discharge status: Home / Self Care | Payer: Medicare Other | Source: Ambulatory Visit | Attending: Pulmonary Disease | Admitting: Pulmonary Disease

## 2011-07-04 DIAGNOSIS — R042 Hemoptysis: Secondary | ICD-10-CM

## 2011-07-04 DIAGNOSIS — J449 Chronic obstructive pulmonary disease, unspecified: Secondary | ICD-10-CM

## 2011-07-04 MED ORDER — LEVOFLOXACIN 750 MG PO TABS: 750.0000 mg | ORAL_TABLET | Freq: Every day | ORAL | Status: AC

## 2011-07-04 NOTE — Assessment & Plan Note (Signed)
The patient is stable on his current medications, with no evidence for acute bronchospasm.  He denies any worsening shortness of breath.

## 2011-07-04 NOTE — Patient Instructions (Signed)
Will treat with levaquin 750mg  one a day for 5 days Get nasal saline spray and spray nostrils am and pm for next one week.  If you continue to cough up blood, will need to check scan of chest.  Let us know if not better in one week, or if quantity of blood increases

## 2011-07-04 NOTE — Progress Notes (Signed)
  Subjective:    Patient ID: Kyle [name], male    DOB: 1934/01/29, 77 y.o.   MRN: [id number]  HPI The patient comes in today for an acute sick visit related to hemoptysis.  He has known COPD, and recent surgery for lung cancer.  He was last seen on November 19 and doing fairly well, but one week later began to cough up mucus with streaks of blood.  He denies any large clots or clumps, and has not had purulent mucus.  The blood has been bright red at times.  He has not had any worsening shortness of breath or chest pain.  He should be noted the patient is on Plavix.  He has been treated with 10 days of cephalosporin by his primary care physician, without significant change.   Review of Systems  Constitutional: Negative for fever and unexpected weight change.  HENT: Positive for congestion, sore throat, rhinorrhea and postnasal drip. Negative for ear pain, nosebleeds, sneezing, trouble swallowing, dental problem and sinus pressure.   Eyes: Negative for redness and itching.  Respiratory: Positive for cough. Negative for chest tightness, shortness of breath and wheezing.   Cardiovascular: Negative for palpitations and leg swelling.  Gastrointestinal: Negative for nausea and vomiting.  Genitourinary: Negative for dysuria.  Musculoskeletal: Negative for joint swelling.  Skin: Negative for rash.  Neurological: Positive for headaches.  Hematological: Bruises/bleeds easily.  Psychiatric/Behavioral: Negative for dysphoric mood. The patient is not nervous/anxious.        Objective:   Physical Exam Overweight male in no acute distress Nose with no purulence or other source of bleeding Oropharynx with no lesions or exudates noted Chest with very faint basilar crackles, otherwise totally clear. Cardiac exam with regular rate and rhythm, 2/6 systolic murmur Lower extremities with mild edema, no cyanosis noted Alert and oriented, moves all 4 extremities.       Assessment & Plan:

## 2011-07-04 NOTE — Assessment & Plan Note (Signed)
The patient has had primarily streaked hemoptysis, and no significant clots or quantity.  It is unclear whether this is coming from his lungs, or possibly from his nasal passages.  His chest x-ray shows no pneumonia or other acute process, but it is very common for acute bronchitis to result in hemoptysis.  He has been treated with a cephalosporin, but I would like to treat him with a more broad-spectrum antibiotic given his history.  I have also asked him to do nasal saline washes in the event this could be from his nasal airway.  If this continues to be a problem, or if it worsens, he will need a followup CT scan of his chest.

## 2011-08-04 ENCOUNTER — Other Ambulatory Visit: Payer: Self-pay | Admitting: *Deleted

## 2011-08-04 MED ORDER — CLOPIDOGREL BISULFATE 75 MG PO TABS: 75.0000 mg | ORAL_TABLET | Freq: Every day | ORAL | Status: DC

## 2011-08-04 MED ORDER — LISINOPRIL 10 MG PO TABS: 10.0000 mg | ORAL_TABLET | Freq: Every day | ORAL | Status: DC

## 2011-09-17 ENCOUNTER — Telehealth: Payer: Self-pay | Admitting: Pulmonary Disease

## 2011-09-17 NOTE — Telephone Encounter (Signed)
Please make sure pt has ov with me in the next 2-3 weeks Let him know that we will need to clarify his oxygen needs at that visit.

## 2011-09-18 NOTE — Telephone Encounter (Signed)
Pt already has pending appt 10/06/11 with [place] at 9:45 am for f/u and to discuss oxygen needs.

## 2011-10-01 LAB — CHG OVA AND PARASITES SMEARS: Stool Culture, Vibrio Only: NEGATIVE

## 2011-10-06 ENCOUNTER — Encounter: Payer: Self-pay | Admitting: Pulmonary Disease

## 2011-10-06 ENCOUNTER — Ambulatory Visit (INDEPENDENT_AMBULATORY_CARE_PROVIDER_SITE_OTHER): Payer: Medicare Other | Admitting: Pulmonary Disease

## 2011-10-06 VITALS — BP 120/62 | HR 55 | Temp 97.6°F | Ht 72.0 in | Wt 273.8 lb

## 2011-10-06 DIAGNOSIS — J449 Chronic obstructive pulmonary disease, unspecified: Secondary | ICD-10-CM

## 2011-10-06 MED ORDER — TIOTROPIUM BROMIDE MONOHYDRATE 18 MCG IN CAPS: 18.0000 ug | ORAL_CAPSULE | Freq: Every day | RESPIRATORY_TRACT | Status: DC

## 2011-10-06 NOTE — Assessment & Plan Note (Signed)
The patient feels that he is doing fairly well overall on Spiriva alone.  He and his wife both feel this has helped more than his symbicort.  I've asked him to stay on this medication, and to work aggressively on weight loss and conditioning.  He stopped wearing oxygen at night on his own, but I think we need to reevaluate him for this.

## 2011-10-06 NOTE — Progress Notes (Signed)
  Subjective:    Patient ID: Kyle [name], male    DOB: [birth year], 78 y.o.   MRN: [id number]  HPI The patient comes in today for followup of his known moderate COPD.  He has stopped symbicort, and went back on Spiriva which he thinks works much better for him.  He is not having any issues with cough, mucus production, or chest congestion.  He has not had an acute exacerbation since last visit.  He did stop using his nocturnal oxygen, and I stressed to him the importance of this.   Review of Systems  Constitutional: Negative for fever and unexpected weight change.  HENT: Positive for congestion, rhinorrhea, postnasal drip and sinus pressure. Negative for ear pain, nosebleeds, sore throat, sneezing, trouble swallowing and dental problem.   Eyes: Positive for redness and itching.  Respiratory: Negative for cough, chest tightness, shortness of breath and wheezing.   Cardiovascular: Negative for palpitations and leg swelling.  Gastrointestinal: Negative for nausea and vomiting.  Genitourinary: Negative for dysuria.  Musculoskeletal: Negative for joint swelling.  Skin: Negative for rash.  Neurological: Negative for headaches.  Hematological: Bruises/bleeds easily.  Psychiatric/Behavioral: Positive for dysphoric mood. The patient is not nervous/anxious.        Objective:   Physical Exam Morbidly obese male in no acute distress Nose without purulence or discharge noted Chest with mild decrease in breath sounds, no wheezes or rhonchi Cardiac exam was regular rate and rhythm Lower extremities with mild edema, no cyanosis Alert and oriented, moves all 4 extremities.       Assessment & Plan:

## 2011-10-06 NOTE — Patient Instructions (Signed)
Stay on spiriva one inhalation each day.  Let me know if breathing is not doing well Work on weight loss and some type of exercise program Will check your oxygen level overnight to make sure you do not require oxygen while sleeping.  If doing well, followup with me in 56mos.

## 2011-10-16 ENCOUNTER — Telehealth: Payer: Self-pay | Admitting: Pulmonary Disease

## 2011-10-16 NOTE — Telephone Encounter (Signed)
Let pt know that his oxygen level falls to 50% at night, and spent almost an hour less than 88%. He really needs to stay on oxygen at 2 liters at night. Please see if he is willing to get back on this.   I think he still has equipment at home?

## 2011-10-21 NOTE — Telephone Encounter (Signed)
ATC pt x 3. Line busy. WCB.  

## 2011-10-22 ENCOUNTER — Encounter: Payer: Self-pay | Admitting: Urgent Care

## 2011-10-22 ENCOUNTER — Ambulatory Visit (INDEPENDENT_AMBULATORY_CARE_PROVIDER_SITE_OTHER): Payer: Medicare Other | Admitting: Urgent Care

## 2011-10-22 VITALS — BP 121/68 | HR 58 | Temp 97.9°F | Ht 72.0 in | Wt 273.6 lb

## 2011-10-22 DIAGNOSIS — D126 Benign neoplasm of colon, unspecified: Secondary | ICD-10-CM | POA: Insufficient documentation

## 2011-10-22 DIAGNOSIS — R197 Diarrhea, unspecified: Secondary | ICD-10-CM | POA: Insufficient documentation

## 2011-10-22 NOTE — Patient Instructions (Addendum)
Begin Metamucil or fiber choice or Benefiber daily Take align one daily Please continue your lab work today Return your stool cards to our office as soon as possible Next colonoscopy in August 2016 or sooner if problems as long as your health remains good

## 2011-10-22 NOTE — Progress Notes (Signed)
Primary Care Physician:  [name], MD, MD Primary Gastroenterologist:  Dr. Gala Romney  Chief Complaint  Patient presents with  . Diarrhea   HPI:  Kyle [name] is a 78 y.o. male with three-week history of intermittent diarrhea.  C/o dark tarry stool  3 weeks ago.  Might go a day without BM.  Then, may have 1-3 loose watery stools/day.  No abdominal pain. +urgency.  He did have diarrhea for several days in a row when this all started.  Diarrhea has been resolved  for 10 days now.  Lots of gas.  Had been on Doxycycline for rosacea by her to the onset of his symptoms.   Denies any upper GI symptoms including heartburn, indigestion, nausea, vomiting, dysphagia, odynophagia or anorexia.  Denies wt loss.  No ill contacts.  On plavix, assirin (on hold) & iron.  On iron for 3-4 yrs.  No NSAIDs.  Last week went to Nephrologist, Dr Raelene Bott [facility] @ [place] had labs drawn.  +SOBOE with history of COPD.  C/o Slight LEE.  Stool studies from Dr.Luking's office are negative for ova and parasite screen, negative stool culture, but did show reduced normal flora, and a negative C. Difficile.  Past Medical History  Diagnosis Date  . Arteriosclerotic cardiovascular disease (ASCVD) 1973    S/P NSTEMI secondary to distal RCA/PL lesion, tx medically.  EF of  55%-60% per  echo.  . Chronic kidney disease     Creatnine 1.4 on discharge 12/20/2100; proteinuria; normal renal ultrasound in 2010; recent creatinines of 1.7-2.  . Diabetes mellitus     Type II  . Thrombocytopenia   . COPD (chronic obstructive pulmonary disease)   . AAA (abdominal aortic aneurysm) 2004    s/p repair 2004; 4.3 cm infrarenal in 05/2011  . Tobacco abuse   . Hyperlipidemia   . OSA (obstructive sleep apnea)   . Bladder cancer 1996    Transurethral resection of the bladder + chemotherapy/BCG as premed  . Hypertension   . Adenocarcinoma, lung 01/2011    transthoracic FNA; resection of the superior segment of the RLL in 03/2011; negative nodes; no  chemotherapy nor radiation planned  . Benign prostatic hypertrophy     s/p transurethral resection of the prostate  . Obesity   . Nephrolithiasis 2012    ARF in 01/2011 due to obstructing nephrolithiasis  . Hernia, incisional   . Tubular adenoma of colon 02/20/10    Colon polyps 2003, 2006 as well  . Cataract     Past Surgical History  Procedure Date  . Abdominal aortic aneurysm repair 2004  . Cardiac catheterization   . Transurethral resection of prostate   . Cystectomy   . Tonsillectomy   . Colonoscopy 03/19/2010    Dr. Gala Romney -(poor prep) Anal papilla, rectal hyperplastic polyp, tubular adenoma removed splenic flexure, left-sided diverticula  . Wedge resection 04/2011    carcinoma of lung    Current Outpatient Prescriptions  Medication Sig Dispense Refill  . acetaminophen (TYLENOL) 500 MG tablet Take 500 mg by mouth every 6 (six) hours as needed.        Marland Kitchen albuterol (PROAIR HFA) 108 (90 BASE) MCG/ACT inhaler Inhale 2 puffs into the lungs every 6 (six) hours as needed for wheezing.  1 Inhaler  1  . ALPRAZolam (XANAX) 0.5 MG tablet Take 0.5 mg by mouth at bedtime as needed.        Marland Kitchen aspirin 81 MG tablet Take 81 mg by mouth daily.        Marland Kitchen  cetirizine (ZYRTEC) 10 MG tablet Take 10 mg by mouth daily.        . Citalopram Hydrobromide (CELEXA PO) Take 20 mg by mouth daily.       . clopidogrel (PLAVIX) 75 MG tablet Take 1 tablet (75 mg total) by mouth daily.  90 tablet  3  . doxycycline (VIBRAMYCIN) 50 MG capsule Take 50 mg by mouth daily.       . ergocalciferol (VITAMIN D2) 50000 UNITS capsule Take 50,000 Units by mouth every 30 (thirty) days.       . ferrous sulfate (FERROUSUL) 325 (65 FE) MG tablet Take 325 mg by mouth daily with breakfast.        . fluticasone (VERAMYST) 27.5 MCG/SPRAY nasal spray Place 2 sprays into the nose daily.        Marland Kitchen lisinopril (PRINIVIL,ZESTRIL) 10 MG tablet Take 1 tablet (10 mg total) by mouth daily. To replace Amlodipine  90 tablet  3  . metoprolol  (LOPRESSOR) 50 MG tablet 50 mg 2 (two) times daily.       Marland Kitchen NIASPAN 500 MG CR tablet Take 1,000 mg by mouth at bedtime.       Marland Kitchen NITROSTAT 0.4 MG SL tablet Place 0.4 mg under the tongue every 5 (five) minutes as needed.       . pravastatin (PRAVACHOL) 80 MG tablet Take 80 mg by mouth daily.       . Probiotic Product (ALIGN PO) Take by mouth.      . Propylhexedrine (BENZEDREX NA) as needed.       . tiotropium (SPIRIVA) 18 MCG inhalation capsule Place 1 capsule (18 mcg total) into inhaler and inhale daily.  90 capsule  4  . DISCONTD: budesonide-formoterol (SYMBICORT) 160-4.5 MCG/ACT inhaler Inhale 2 puffs into the lungs 2 (two) times daily.          Allergies as of 10/22/2011  . (No Known Allergies)    Family History:There is no known family history of colorectal carcinoma , liver disease, or inflammatory bowel disease.  Problem Relation Age of Onset  . Aortic aneurysm Mother   . Emphysema Father     smoker  . Stroke Paternal Grandmother   . Other Paternal Grandfather     brain aneurysm  . Clotting disorder Father   . Arthritis Father   Review of Systems: Gen: Denies any fever, chills, sweats, anorexia, fatigue, weakness, malaise, weight loss.  + Sleep apnea CV: Denies chest pain, angina, palpitations, syncope, orthopnea, PND, peripheral edema, and claudication. Resp: See history of present illness. Denies cough, sputum, wheezing, coughing up blood, and pleurisy. GI: Denies vomiting blood, jaundice, and fecal incontinence.   GU : Denies urinary burning, blood in urine, urinary frequency, urinary hesitancy, nocturnal urination, and urinary incontinence. MS: Denies joint pain, limitation of movement, and swelling, stiffness, low back pain, extremity pain. Denies muscle weakness, cramps, atrophy.  Derm: Denies rash, itching, dry skin, hives, moles, warts, or unhealing ulcers.  Psych: Denies depression, anxiety, memory loss, suicidal ideation, hallucinations, paranoia, and confusion. Heme:  Denies bruising, and enlarged lymph nodes. Neuro:  Denies any headaches, dizziness, paresthesias. Endo:  Denies any problems with DM, thyroid, adrenal function.  Physical Exam: BP 121/68  Pulse 58  Temp(Src) 97.9 F (36.6 C) (Temporal)  Ht 6' (1.829 m)  Wt 273 lb 9.6 oz (124.104 kg)  BMI 37.11 kg/m2 General:   Alert,  Well-developed, obese, pleasant and cooperative in NAD Head:  Normocephalic and atraumatic. Eyes:  Sclera clear, no icterus.  Conjunctiva pink. Ears:  Normal auditory acuity. Nose:  No deformity, discharge, or lesions. Mouth:  No deformity or lesions,oropharynx pink & moist. Neck:  Supple; no masses or thyromegaly. Lungs:  Clear throughout to auscultation.   No wheezes, crackles, or rhonchi. No acute distress. Heart:  Regular rate and rhythm; no murmurs, clicks, rubs,  or gallops. Abdomen:  Protuberant. Normal bowel sounds.  No bruits.  Soft, non-tender and non-distended without masses, hepatosplenomegaly.  Large left mid-abdominal nontender, soft incisional hernia noted.  No guarding or rebound tenderness.   Rectal:  No external lesions visualized. Patient has good sphincter tone. A small amount of dark brown stools in the vault which is Hemoccult negative. No internal masses palpated. Msk:  Symmetrical without gross deformities. Normal posture. Pulses:  Normal pulses noted. Extremities:  + clubbing.  Trace pretibial edema. Neurologic:  Alert and  oriented x4;  grossly normal neurologically. Skin:  Intact without significant lesions or rashes. Lymph Nodes:  No significant cervical adenopathy. Psych:  Alert and cooperative. Normal mood and affect.

## 2011-10-22 NOTE — Assessment & Plan Note (Addendum)
Kyle [name] is a pleasant 78 y.o. male with 3 week history of intermittent diarrhea. It was very persistent in the beginning for several days suggestive of acute colitis. Stool studies and C. difficile were negative, however he had been on a course of doxycycline for rosacea. It is possible he either had pseudomembranous colitis secondary to doxycycline use versus gastroenteritis. His dark stools are concerning. He was Hemoccult negative in the office today. I suspect this may be due to iron, however we do need to rule out occult bleeding. He did have reduced normal flora on his stool studies.  Begin align daily CBC today Hemoccult stools x3 Requested labs from Dr. Judee Clara [facility] nephrologist) last week\ Begin Metamucil, Benefiber, or fiber choice daily

## 2011-10-22 NOTE — Progress Notes (Signed)
Faxed to PCP

## 2011-10-23 ENCOUNTER — Encounter: Payer: Self-pay | Admitting: Pulmonary Disease

## 2011-10-23 LAB — CBC WITH DIFFERENTIAL/PLATELET
Basophils Absolute: 0 10*3/uL (ref 0.0–0.1)
Basophils Relative: 1 % (ref 0–1)
Eosinophils Absolute: 0.3 10*3/uL (ref 0.0–0.7)
HCT: 42.3 % (ref 39.0–52.0)
Hemoglobin: 13.3 g/dL (ref 13.0–17.0)
MCH: 30.6 pg (ref 26.0–34.0)
MCHC: 31.4 g/dL (ref 30.0–36.0)
Monocytes Absolute: 0.6 10*3/uL (ref 0.1–1.0)
Monocytes Relative: 10 % (ref 3–12)
Neutro Abs: 3.5 10*3/uL (ref 1.7–7.7)
RDW: 16.8 % — ABNORMAL HIGH (ref 11.5–15.5)

## 2011-10-23 NOTE — Progress Notes (Signed)
Quick Note:  Tried to call pt- LMOM ______ 

## 2011-10-23 NOTE — Progress Notes (Signed)
Quick Note:  Pt aware ______ 

## 2011-10-23 NOTE — Telephone Encounter (Signed)
LMOM for pt TCB 

## 2011-10-23 NOTE — Progress Notes (Signed)
Quick Note:  Please call patient and let him know his hemoglobin is normal now His platelet count is chronically low, but stable. CC: [name], MD  ______

## 2011-10-24 NOTE — Telephone Encounter (Signed)
Pt returned my call.  Informed him of test results.  And KC's res.  Pt agreed to stay on o2 qhs at 2 LPM and states he already has the equipment at the house. Nothing further needed.

## 2011-10-28 ENCOUNTER — Encounter: Payer: Self-pay | Admitting: Cardiology

## 2011-10-28 ENCOUNTER — Ambulatory Visit (INDEPENDENT_AMBULATORY_CARE_PROVIDER_SITE_OTHER): Payer: Medicare Other | Admitting: Cardiology

## 2011-10-28 VITALS — BP 132/70 | HR 61 | Resp 18 | Ht 72.0 in | Wt 273.0 lb

## 2011-10-28 DIAGNOSIS — J449 Chronic obstructive pulmonary disease, unspecified: Secondary | ICD-10-CM

## 2011-10-28 DIAGNOSIS — I714 Abdominal aortic aneurysm, without rupture: Secondary | ICD-10-CM

## 2011-10-28 DIAGNOSIS — I719 Aortic aneurysm of unspecified site, without rupture: Secondary | ICD-10-CM

## 2011-10-28 DIAGNOSIS — Z720 Tobacco use: Secondary | ICD-10-CM

## 2011-10-28 DIAGNOSIS — C3490 Malignant neoplasm of unspecified part of unspecified bronchus or lung: Secondary | ICD-10-CM

## 2011-10-28 DIAGNOSIS — F17200 Nicotine dependence, unspecified, uncomplicated: Secondary | ICD-10-CM

## 2011-10-28 DIAGNOSIS — K439 Ventral hernia without obstruction or gangrene: Secondary | ICD-10-CM | POA: Insufficient documentation

## 2011-10-28 DIAGNOSIS — R197 Diarrhea, unspecified: Secondary | ICD-10-CM

## 2011-10-28 DIAGNOSIS — I2510 Atherosclerotic heart disease of native coronary artery without angina pectoris: Secondary | ICD-10-CM

## 2011-10-28 DIAGNOSIS — I10 Essential (primary) hypertension: Secondary | ICD-10-CM

## 2011-10-28 DIAGNOSIS — C679 Malignant neoplasm of bladder, unspecified: Secondary | ICD-10-CM

## 2011-10-28 NOTE — Assessment & Plan Note (Addendum)
Patient tolerated resection of lung neoplasm without apparent cardiac complications and remains asymptomatic at present.  We will continue to attempt to optimally manage risk factors.  He is 10 months out from a non-ST segment elevation myocardial infarction with coronary anatomy not amenable to revascularization.  Clopidogrel can be discontinued in 2 months.

## 2011-10-28 NOTE — Assessment & Plan Note (Signed)
Tolerated partial lobectomy well and currently doing well symptomatically.

## 2011-10-28 NOTE — Progress Notes (Signed)
Patient ID: Kyle [name], male   DOB: Nov 18, 1933, 78 y.o.   MRN: [id number]  HPI: Scheduled return visit for continuing assessment of cardiovascular disease and multiple cardiovascular risk factors following partial right lower lung lobectomy for neoplastic disease.  The patient has recovered well from his surgery and has no further pain, cough, sputum production or dyspnea.  Lifestyle is sedentary, but he has resumed his usual activity without difficulty.  Patient suffered a fall a week or 2 ago apparently related to loss of balance rather than loss of consciousness.  He subsequently experienced pain and bruising over the left hip, but x-rays were negative for fracture.  Prior to Admission medications   Medication Sig Start Date End Date Taking? Authorizing Provider  acetaminophen (TYLENOL) 500 MG tablet Take 500 mg by mouth every 6 (six) hours as needed.     Yes Historical Provider, MD  albuterol (PROAIR HFA) 108 (90 BASE) MCG/ACT inhaler Inhale 2 puffs into the lungs every 6 (six) hours as needed for wheezing. 03/12/11 03/11/12 Yes Kathee Delton, MD  ALPRAZolam Duanne Moron) 0.5 MG tablet Take 0.5 mg by mouth at bedtime as needed.     Yes Historical Provider, MD  aspirin 81 MG tablet Take 81 mg by mouth daily.     Yes Historical Provider, MD  cetirizine (ZYRTEC) 10 MG tablet Take 10 mg by mouth daily.     Yes Historical Provider, MD  Citalopram Hydrobromide (CELEXA PO) Take 20 mg by mouth daily.    Yes Historical Provider, MD  doxycycline (VIBRAMYCIN) 50 MG capsule Take 50 mg by mouth daily.  12/18/10  Yes Historical Provider, MD  ergocalciferol (VITAMIN D2) 50000 UNITS capsule Take 50,000 Units by mouth every 30 (thirty) days.    Yes Historical Provider, MD  ferrous sulfate (FERROUSUL) 325 (65 FE) MG tablet Take 325 mg by mouth daily with breakfast.     Yes Historical Provider, MD  FIBER PO Take by mouth daily.   Yes Historical Provider, MD  fluticasone (VERAMYST) 27.5 MCG/SPRAY nasal spray Place 2  sprays into the nose daily.     Yes Historical Provider, MD  lisinopril (PRINIVIL,ZESTRIL) 10 MG tablet Take 1 tablet (10 mg total) by mouth daily. To replace Amlodipine 08/04/11 08/03/12 Yes Yehuda Savannah, MD  NIASPAN 500 MG CR tablet Take 1,000 mg by mouth at bedtime.  11/27/10  Yes Historical Provider, MD  NITROSTAT 0.4 MG SL tablet Place 0.4 mg under the tongue every 5 (five) minutes as needed.  12/21/10  Yes Historical Provider, MD  pravastatin (PRAVACHOL) 80 MG tablet Take 80 mg by mouth daily.  11/27/10  Yes Historical Provider, MD  Probiotic Product (ALIGN PO) Take by mouth.   Yes Historical Provider, MD  Propylhexedrine [facility] NA) as needed.    Yes Historical Provider, MD  tiotropium (SPIRIVA) 18 MCG inhalation capsule Place 1 capsule (18 mcg total) into inhaler and inhale daily. 10/06/11  Yes Kathee Delton, MD   No Known Allergies    Past medical history, social history, and family history reviewed and updated.  ROS: No orthopnea, PND, peripheral edema, palpitations, lightheadedness or syncope.  All other systems reviewed and are negative.  PHYSICAL EXAM: BP 132/70  Pulse 61  Resp 18  Ht 6' (1.829 m)  Wt 123.832 kg (273 lb)  BMI 37.03 kg/m2; Weight has increased 16 pounds over the past 6 months.  General-Well developed; no acute distress Body habitus-Obese Neck-No JVD; no carotid bruits Lungs-clear lung fields; resonant to percussion; Modestly  decreased breath sounds and prolongation of the expiratory phase. Cardiovascular-normal PMI; normal S1 and S2 Abdomen-normal bowel sounds; soft and non-tender without masses or organomegaly; Sizable ventral hernia Musculoskeletal-No deformities, no cyanosis or clubbing Neurologic-Normal cranial nerves; symmetric strength and tone Skin-Warm, no significant lesions Extremities-distal pulses intact; 1/2+ edema on the left and 1+ on the right  ASSESSMENT AND PLAN:  Jacqulyn Ducking, MD 10/28/2011 11:06 PM

## 2011-10-28 NOTE — Assessment & Plan Note (Signed)
Patient has recovered completely following surgery.  Ongoing surveillance will be via the chest clinic at [facility].

## 2011-10-28 NOTE — Assessment & Plan Note (Signed)
Renal function moderately impaired and stable.  Receiving no nephrotoxic drugs.

## 2011-10-28 NOTE — Assessment & Plan Note (Signed)
Intermittent episodes of diarrhea.  Chronic treatment with antibiotics for rosacea recently discontinued.

## 2011-10-28 NOTE — Assessment & Plan Note (Signed)
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 Rosana Hoes

## 2011-10-28 NOTE — Patient Instructions (Signed)
Your physician recommends that you schedule a follow-up appointment in: [place] has recommended you make the following change in your medication: STOP PLAVIX  WEIGHT LOSS  RESTRICT POTASSIUM IN DIET

## 2011-10-28 NOTE — Assessment & Plan Note (Signed)
Blood pressure control has been generally good over the past 2 years with occasional systolics as high as [id number].  Current medication will be continued.

## 2011-10-29 ENCOUNTER — Ambulatory Visit (INDEPENDENT_AMBULATORY_CARE_PROVIDER_SITE_OTHER): Payer: Medicare Other | Admitting: Internal Medicine

## 2011-10-29 DIAGNOSIS — R197 Diarrhea, unspecified: Secondary | ICD-10-CM

## 2011-10-29 DIAGNOSIS — K921 Melena: Secondary | ICD-10-CM

## 2011-10-29 LAB — POC HEMOCCULT BLD/STL (HOME/3-CARD/SCREEN)
Card #2 Fecal Occult Blod, POC: NEGATIVE
Card #3 Fecal Occult Blood, POC: NEGATIVE

## 2011-10-29 NOTE — Progress Notes (Signed)
Pt returned 3 hemoccult cards and they were all negative.

## 2011-10-30 ENCOUNTER — Telehealth: Payer: Self-pay | Admitting: Pulmonary Disease

## 2011-10-30 NOTE — Telephone Encounter (Signed)
megan have you seen any papers from [facility] for this pt about his oxygen?

## 2011-10-30 NOTE — Telephone Encounter (Signed)
LMOM for Kyle [name] at [facility] to let her know i don't have any paperwork on pt and to have her refax.

## 2011-10-30 NOTE — Telephone Encounter (Signed)
My name is not [place]

## 2011-10-30 NOTE — Progress Notes (Signed)
Quick Note:  Tried to call pt- LMOM ______ 

## 2011-10-30 NOTE — Progress Notes (Signed)
Quick Note:  Please let patient noted there was no blood in his stool. Needs 6 week followup for diarrhea Thanks CC: [name], MD  ______

## 2011-11-03 NOTE — Telephone Encounter (Signed)
[name], have you gotten the papers on this patient yet? Thanks.

## 2011-11-03 NOTE — Progress Notes (Signed)
Faxed Results to PCP 

## 2011-11-03 NOTE — Progress Notes (Signed)
Quick Note:  Mailed letter to pt.  Kyle [name], please schedule Serena Colonel, please cc pcp. ______

## 2011-11-04 ENCOUNTER — Encounter: Payer: Self-pay | Admitting: Urgent Care

## 2011-11-04 NOTE — Telephone Encounter (Signed)
Kyle [name] returned call. She is re-faxing papers now. Kyle [name]

## 2011-11-04 NOTE — Telephone Encounter (Signed)
Kyle [name] is not in the office today, will return tomorrow.  Will forward to her inbox to be on the lookout for pt's paperwork.

## 2011-11-06 NOTE — Telephone Encounter (Signed)
Still haven't seen paperwork on this pt.  LMOM for Vonna TCB

## 2011-11-07 NOTE — Telephone Encounter (Signed)
Received paperwork from [facility] and gave to [place] as it is regarding oxygen CMN.  Called and spoke with Vonna and informed her we did receive paperwork and will process it. Nothing further needed.

## 2011-11-13 ENCOUNTER — Encounter: Payer: Self-pay | Admitting: Urgent Care

## 2011-11-13 ENCOUNTER — Ambulatory Visit (INDEPENDENT_AMBULATORY_CARE_PROVIDER_SITE_OTHER): Payer: Medicare Other | Admitting: Urgent Care

## 2011-11-13 VITALS — BP 115/75 | HR 63 | Temp 97.5°F | Ht 72.0 in | Wt 269.6 lb

## 2011-11-13 DIAGNOSIS — D126 Benign neoplasm of colon, unspecified: Secondary | ICD-10-CM

## 2011-11-13 DIAGNOSIS — R1032 Left lower quadrant pain: Secondary | ICD-10-CM

## 2011-11-13 DIAGNOSIS — R159 Full incontinence of feces: Secondary | ICD-10-CM

## 2011-11-13 DIAGNOSIS — R634 Abnormal weight loss: Secondary | ICD-10-CM

## 2011-11-13 DIAGNOSIS — R197 Diarrhea, unspecified: Secondary | ICD-10-CM

## 2011-11-13 LAB — CREATININE, SERUM: Creat: 1.88 mg/dL — ABNORMAL HIGH (ref 0.50–1.35)

## 2011-11-13 NOTE — Progress Notes (Signed)
Primary Care Physician:  [name], MD, MD Primary Gastroenterologist:  Dr. Gala Romney  Chief Complaint  Patient presents with  . Diarrhea    8-10 weeks   HPI:  Kyle [name] is a 78 y.o. male with 8 week history of intermittent diarrhea.  He was seen by me 4/3 for the same problem.  Stool studies from Dr.Luking's office are negative for ova and parasite screen, negative stool culture, but did show reduced normal flora, and a negative C. Difficile.  CBC showed chronic thrombocytopenia & was otherwise normal.  Diarrhea started after a course of doxycycline.  Stool diary shows 6 explosive diarrheal accidents usually around 7-8pm in past 3 weeks.  He tells me he has normal BMs on the other days.  Most episodes result in incontinence of his bowels as he cannot get to BR on time.  C/o LLQ pain.  Wt is down 4# in 3 weeks.  Trying Align & fiber with no relief.  TSH normal May 2012.  Hemoccults negative x 3.  Denies fever or chills.  Appetite ok.   Denies any upper GI symptoms including heartburn, indigestion, nausea, vomiting, dysphagia, odynophagia or anorexia. Past Medical History  Diagnosis Date  . Arteriosclerotic cardiovascular disease (ASCVD) 1973, 12/2010    S/P NSTEMI secondary to distal RCA/PL lesion, tx medically.  EF of  55%-60% per  echo.  . Chronic kidney disease     Creatinine 1.4 on discharge 12/20/2100; proteinuria; normal renal ultrasound in 2010; recent creatinines of 1.7-2.; Bilateral cystic renal masses by CT in 2011  . Diabetes mellitus     Type II  . Thrombocytopenia   . COPD (chronic obstructive pulmonary disease)   . AAA (abdominal aortic aneurysm) 2004    s/p repair 2004; 4.3 cm infrarenal in 05/2011  . Tobacco abuse     50-pack-year consumption; quit in 12/2010  . Hyperlipidemia   . OSA (obstructive sleep apnea)   . Bladder cancer 1996    Transurethral resection of the bladder + chemotherapy/BCG as premed  . Hypertension   . Adenocarcinoma, lung 01/2011    transthoracic FNA;  resection of the superior segment of the RLL in 03/2011; negative nodes; no chemotherapy nor radiation planned  . Benign prostatic hypertrophy     s/p transurethral resection of the prostate  . Obesity   . Nephrolithiasis 2012    ARF in 01/2011 due to obstructing nephrolithiasis  . Tubular adenoma of colon 02/20/10    Colon polyps 2003, 2006 as well  . Cataract   . Ventral hernia   . Diverticulosis   . Bilateral renal masses     Cystic, more prominent on CT in 12/2010 than 2007; followed by Dr. Rosana Hoes    Past Surgical History  Procedure Date  . Abdominal aortic aneurysm repair 2004  . Cardiac catheterization   . Transurethral resection of prostate   . Cystectomy   . Tonsillectomy   . Colonoscopy 03/19/2010    Dr. Gala Romney -(poor prep) Anal papilla, rectal hyperplastic polyp, tubular adenoma removed splenic flexure, left-sided diverticula  . Wedge resection 04/2011    carcinoma of lung    Current Outpatient Prescriptions  Medication Sig Dispense Refill  . ALPRAZolam (XANAX) 0.5 MG tablet Take 0.5 mg by mouth at bedtime as needed.        Marland Kitchen aspirin 81 MG tablet Take 81 mg by mouth daily.        . cetirizine (ZYRTEC) 10 MG tablet Take 10 mg by mouth daily.        Marland Kitchen  Citalopram Hydrobromide (CELEXA PO) Take 20 mg by mouth daily.       . ergocalciferol (VITAMIN D2) 50000 UNITS capsule Take 50,000 Units by mouth every 30 (thirty) days.       . ferrous sulfate (FERROUSUL) 325 (65 FE) MG tablet Take 325 mg by mouth daily with breakfast.        . FIBER PO Take by mouth daily.      . fluticasone (VERAMYST) 27.5 MCG/SPRAY nasal spray Place 2 sprays into the nose daily.        Marland Kitchen lisinopril (PRINIVIL,ZESTRIL) 10 MG tablet Take 1 tablet (10 mg total) by mouth daily. To replace Amlodipine  90 tablet  3  . NIASPAN 500 MG CR tablet Take 1,000 mg by mouth at bedtime.       Marland Kitchen NITROSTAT 0.4 MG SL tablet Place 0.4 mg under the tongue every 5 (five) minutes as needed.       . pravastatin (PRAVACHOL) 80 MG  tablet Take 80 mg by mouth daily.       . Probiotic Product (ALIGN PO) Take by mouth.      . Propylhexedrine (BENZEDREX NA) as needed.       . tiotropium (SPIRIVA) 18 MCG inhalation capsule Place 1 capsule (18 mcg total) into inhaler and inhale daily.  90 capsule  4  . acetaminophen (TYLENOL) 500 MG tablet Take 500 mg by mouth every 6 (six) hours as needed.        Marland Kitchen albuterol (PROAIR HFA) 108 (90 BASE) MCG/ACT inhaler Inhale 2 puffs into the lungs every 6 (six) hours as needed for wheezing.  1 Inhaler  1  . DISCONTD: budesonide-formoterol (SYMBICORT) 160-4.5 MCG/ACT inhaler Inhale 2 puffs into the lungs 2 (two) times daily.          Allergies as of 11/13/2011  . (No Known Allergies)    Family History:There is no known family history of colorectal carcinoma , liver disease, or inflammatory bowel disease.  Problem Relation Age of Onset  . Aortic aneurysm Mother   . Emphysema Father     smoker  . Stroke Paternal Grandmother   . Other Paternal Grandfather     brain aneurysm  . Clotting disorder Father   . Arthritis Father   Review of Systems: Gen: Denies any fever, chills, sweats, anorexia, fatigue, weakness, malaise, weight loss.  + Sleep apnea CV: Denies chest pain, angina, palpitations, syncope, orthopnea, PND, peripheral edema, and claudication. Resp: See history of present illness. Denies cough, sputum, wheezing, coughing up blood, and pleurisy. GI: Denies vomiting blood, jaundice, and fecal incontinence.   GU : Denies urinary burning, blood in urine, urinary frequency, urinary hesitancy, nocturnal urination, and urinary incontinence. MS: Denies joint pain, limitation of movement, and swelling, stiffness, low back pain, extremity pain. Denies muscle weakness, cramps, atrophy.  Derm: Denies rash, itching, dry skin, hives, moles, warts, or unhealing ulcers.  Psych: Denies depression, anxiety, memory loss, suicidal ideation, hallucinations, paranoia, and confusion. Heme: Denies  bruising, and enlarged lymph nodes. Neuro:  Denies any headaches, dizziness, paresthesias. Endo:  Denies any problems with DM, thyroid, adrenal function.  Physical Exam: BP 115/75  Pulse 63  Temp(Src) 97.5 F (36.4 C) (Temporal)  Ht 6' (1.829 m)  Wt 269 lb 9.6 oz (122.29 kg)  BMI 36.56 kg/m2 General:   Alert,  Well-developed, obese, pleasant and cooperative in NAD Head:  Normocephalic and atraumatic. Eyes:  Sclera clear, no icterus.   Conjunctiva pink. Ears:  Normal auditory acuity. Nose:  No  deformity, discharge, or lesions. Mouth:  No deformity or lesions,oropharynx pink & moist. Neck:  Supple; no masses or thyromegaly. Lungs:  Clear throughout to auscultation.   No wheezes, crackles, or rhonchi. No acute distress. Heart:  Regular rate and rhythm; no murmurs, clicks, rubs,  or gallops. Abdomen:  Protuberant. Normal bowel sounds.  No bruits.  Soft, non-tender and non-distended without masses, hepatosplenomegaly.  Large left mid-abdominal nontender, soft incisional hernia noted. Mild LLQ TTP.   No guarding or rebound tenderness.   Rectal:  Deferred. Msk:  Symmetrical without gross deformities. Normal posture. Pulses:  Normal pulses noted. Extremities:  + clubbing.  Trace pretibial edema. Neurologic:  Alert and  oriented x4;  grossly normal neurologically. Skin:  Intact without significant lesions or rashes. Lymph Nodes:  No significant cervical adenopathy. Psych:  Alert and cooperative. Normal mood and affect.

## 2011-11-13 NOTE — Assessment & Plan Note (Addendum)
6-8 week history of intermittent explosive diarrhea with incontinence.  LLQ pain & tenderness.  Previous c diff negative, but recent doxycycline.  Differentials include c diff colitis or pseudomembranous colitis, microscopic colitis, or overflow incontinence.  Less likely colorectal carcinoma or lymphoma.  TSH normal May 2012.  Continue align daily  Continue fiber daily  c diff PCR, giardia Imodium 2mg  daily prn after stool studies CT Abd/pelvis with IV/oral contrast if creatinine normal.  If creatinine elevated, No IV contrast. If CT unrevealing, consider flexible sigmoidoscopy with biopsies.

## 2011-11-13 NOTE — Patient Instructions (Signed)
Return stools to the lab ASAP Have lab drawn before CT scan I will call you with CT results Continue ALIGN daily & fiber daily Use imodium 2mg  as needed daily after you return your stools

## 2011-11-14 ENCOUNTER — Other Ambulatory Visit: Payer: Self-pay | Admitting: Gastroenterology

## 2011-11-14 DIAGNOSIS — R1032 Left lower quadrant pain: Secondary | ICD-10-CM

## 2011-11-14 DIAGNOSIS — R197 Diarrhea, unspecified: Secondary | ICD-10-CM

## 2011-11-14 DIAGNOSIS — R634 Abnormal weight loss: Secondary | ICD-10-CM

## 2011-11-14 NOTE — Progress Notes (Signed)
Quick Note:  Pt needs CT A/P with oral contrast only Re; chronic diarrhea, weight loss, LLQ pain Thanks ______

## 2011-11-14 NOTE — Progress Notes (Signed)
Faxed to PCP

## 2011-11-17 ENCOUNTER — Encounter (HOSPITAL_COMMUNITY): Payer: Self-pay

## 2011-11-17 ENCOUNTER — Ambulatory Visit (HOSPITAL_COMMUNITY)
Admission: RE | Admit: 2011-11-17 | Discharge: 2011-11-17 | Disposition: A | Discharge status: Home / Self Care | Payer: Medicare Other | Source: Ambulatory Visit | Attending: Urgent Care | Admitting: Urgent Care

## 2011-11-17 DIAGNOSIS — I722 Aneurysm of renal artery: Secondary | ICD-10-CM | POA: Insufficient documentation

## 2011-11-17 DIAGNOSIS — K439 Ventral hernia without obstruction or gangrene: Secondary | ICD-10-CM | POA: Insufficient documentation

## 2011-11-17 DIAGNOSIS — R197 Diarrhea, unspecified: Secondary | ICD-10-CM | POA: Insufficient documentation

## 2011-11-17 DIAGNOSIS — R1032 Left lower quadrant pain: Secondary | ICD-10-CM

## 2011-11-17 DIAGNOSIS — Q619 Cystic kidney disease, unspecified: Secondary | ICD-10-CM | POA: Insufficient documentation

## 2011-11-17 DIAGNOSIS — R634 Abnormal weight loss: Secondary | ICD-10-CM | POA: Insufficient documentation

## 2011-11-17 LAB — GIARDIA/CRYPTOSPORIDIUM (EIA): Giardia Screen (EIA): NEGATIVE

## 2011-11-18 NOTE — Progress Notes (Signed)
HEME NEG, HB 13.3  REVIEWED.

## 2011-11-19 ENCOUNTER — Telehealth: Payer: Self-pay | Admitting: Urgent Care

## 2011-11-19 MED ORDER — SIMETHICONE 80 MG PO CHEW: 80.0000 mg | CHEWABLE_TABLET | Freq: Two times a day (BID) | ORAL | Status: DC | PRN

## 2011-11-19 MED ORDER — CHOLESTYRAMINE 4 G PO PACK: 0.5000 | PACK | Freq: Every day | ORAL | Status: DC

## 2011-11-19 NOTE — Progress Notes (Signed)
Quick Note:  See phone note ______ 

## 2011-11-19 NOTE — Telephone Encounter (Signed)
Discussed results of CT and stool studies with patient. Discussed next step Regarding starting Questran and simethicone. Will send Rx to [facility] refill. Patient needs office visit with RMR only in 4-6 weeks Patient to call if no improvement with meds in 2-3 weeks.

## 2011-11-20 ENCOUNTER — Encounter: Payer: Self-pay | Admitting: Internal Medicine

## 2011-11-20 NOTE — Telephone Encounter (Signed)
Pt is aware of OV on 5/21 at 4  With RMR and appt card was mailed

## 2011-11-24 ENCOUNTER — Ambulatory Visit (HOSPITAL_COMMUNITY): Payer: Medicare Other

## 2011-11-26 ENCOUNTER — Encounter (HOSPITAL_COMMUNITY): Payer: Medicare Other | Attending: Oncology | Admitting: Oncology

## 2011-11-26 ENCOUNTER — Encounter (HOSPITAL_COMMUNITY): Payer: Self-pay | Admitting: Oncology

## 2011-11-26 VITALS — BP 137/84 | HR 52 | Temp 96.0°F | Wt 270.0 lb

## 2011-11-26 DIAGNOSIS — C3430 Malignant neoplasm of lower lobe, unspecified bronchus or lung: Secondary | ICD-10-CM

## 2011-11-26 DIAGNOSIS — C3490 Malignant neoplasm of unspecified part of unspecified bronchus or lung: Secondary | ICD-10-CM

## 2011-11-26 DIAGNOSIS — N289 Disorder of kidney and ureter, unspecified: Secondary | ICD-10-CM

## 2011-11-26 DIAGNOSIS — R197 Diarrhea, unspecified: Secondary | ICD-10-CM

## 2011-11-26 DIAGNOSIS — D696 Thrombocytopenia, unspecified: Secondary | ICD-10-CM

## 2011-11-26 NOTE — Progress Notes (Signed)
CC:   W. Rosemary Holms, M.D. Bridgette Habermann, MD FACP Minda Ditto, M.D.  DIAGNOSES: 1. Stage IA (T1b N0 M0) moderately differentiated adenocarcinoma of     the lung, 2.3 cm in size with resection on 04/03/2011.  No     lymphovascular invasion, no pleural involvement, margins clear.     Six lymph nodes all negative and that gave him stage IA disease     (T1b N0 M0). 2. Intermittent thrombocytopenia, unclear as to etiology. 3. History of bladder cancer, treated by Dr. Laurence Ferrari at [facility]     years ago. 4. Diarrhea for 8-10 weeks with incontinence on many occasions and he     is now on simethicone and cholestyramine with no problems for the     last 5 days.  Workup thus far has not revealed an etiology,     including a CT of the abdomen and pelvis done the other day. 5. Abdominal hernia which is huge secondary to abdominal aortic     aneurysm surgery approximately 2008. 6. History of kidney stone disease. 7. History of myocardial infarction x2, one 25 years ago and one in     May 2012.  His cardiologist is Dr. Lattie Haw. 8. Mild renal insufficiency. 9. Bladder cancer, treated by Dr. Katrine Coho with BCG many years     ago. 10.Intermittent double vision.  I cannot detect it presently.  He has     mentioned Dr. Maye Hides is his ophthalmologist, but he does     have it at least once a week if not more often and if it continues     to bother him and he states he had a bad episode today, I think he     needs to call Dr. Iona Hansen or Dr. Wolfgang Phoenix about a possible     consultation with a neurologist. 11.Obesity. 12.History of skin cancer removed by Dr. Denna Haggard in the past. Kyle [name] otherwise has done well except for the diarrhea which started 8-10 weeks ago, which has no distinct etiology and he has had a good workup thus far.  The cholestyramine which he started about 5 or 6 days ago has really helped almost to the point of being constipated.  He is only on a half a pack a  day.  Other than that, he did not get his CT chest appointment for some reason, so we will reschedule that for next week.  PHYSICAL EXAMINATION:  General:  He looks good today.  Abdomen:  Bowel sounds are quiet really today.  Lungs:  Clear.  Incision sites are healed.  Heart:  Bradycardia at 48-52, but regular.  No S3 gallop.  I did not hear a murmur.  Abdomen:  Shows a huge hernia.  He has again decreased bowel sounds.  Extremities:  No peripheral edema presently. Lymph:  He has no adenopathy that I can appreciate.  So his last platelet count in April of this year was 121,000, which is stable.  I just need to see him back.  We will check it again when he comes for his CT scan in 6 months.  Otherwise I think I am more concerned about the double vision, but I could not detect any EOM abnormalities on him.  He did not have double vision while I was in the room with him, but he states he had it before I came in and it was a significant length of time, he states.  So I will see him back.  ______________________________ Gaston Islam. Tressie Stalker, MD ESN/MEDQ  D:  11/26/2011  T:  11/26/2011  Job:  [id number]

## 2011-11-26 NOTE — Progress Notes (Signed)
This office note has been dictated.

## 2011-11-26 NOTE — Patient Instructions (Signed)
Kyle [name]  [id number] 1934/01/06 Dr. Everardo All   [facility]  Discharge Instructions  RECOMMENDATIONS MADE BY THE CONSULTANT AND ANY TEST RESULTS WILL BE SENT TO YOUR REFERRING DOCTOR.   EXAM FINDINGS BY MD TODAY AND SIGNS AND SYMPTOMS TO REPORT TO CLINIC OR PRIMARY MD: Exam and discussion per MD.  Need to do CT scan of your chest next week and another one in 6 months.  Your platlet count is a little low but stable.  MEDICATIONS PRESCRIBED: none   INSTRUCTIONS GIVEN AND DISCUSSED: Other:  Report any unusual bruising or bleeding  SPECIAL INSTRUCTIONS/FOLLOW-UP: Xray Studies Needed CT scan of your chest next week and in 6 months and Return to Clinic on after scans in 6 months.   I acknowledge that I have been informed and understand all the instructions given to me and received a copy. I do not have any more questions at this time, but understand that I may call the Specialty Clinic at [facility] at [phone] during business hours should I have any further questions or need assistance in obtaining follow-up care.    __________________________________________  _____________  __________ Signature of Patient or Authorized Representative            Date                   Time    __________________________________________ Nurse's Signature

## 2011-12-01 ENCOUNTER — Other Ambulatory Visit (HOSPITAL_COMMUNITY): Payer: Self-pay | Admitting: Oncology

## 2011-12-01 ENCOUNTER — Ambulatory Visit (HOSPITAL_COMMUNITY)
Admission: RE | Admit: 2011-12-01 | Discharge: 2011-12-01 | Disposition: A | Discharge status: Home / Self Care | Payer: Medicare Other | Source: Ambulatory Visit | Attending: Oncology | Admitting: Oncology

## 2011-12-01 DIAGNOSIS — R918 Other nonspecific abnormal finding of lung field: Secondary | ICD-10-CM | POA: Insufficient documentation

## 2011-12-01 DIAGNOSIS — M5380 Other specified dorsopathies, site unspecified: Secondary | ICD-10-CM | POA: Insufficient documentation

## 2011-12-01 DIAGNOSIS — C3490 Malignant neoplasm of unspecified part of unspecified bronchus or lung: Secondary | ICD-10-CM | POA: Insufficient documentation

## 2011-12-09 ENCOUNTER — Encounter: Payer: Self-pay | Admitting: Internal Medicine

## 2011-12-09 ENCOUNTER — Ambulatory Visit (INDEPENDENT_AMBULATORY_CARE_PROVIDER_SITE_OTHER): Payer: Medicare Other | Admitting: Internal Medicine

## 2011-12-09 VITALS — BP 140/73 | HR 67 | Temp 98.1°F | Ht 72.0 in | Wt 273.2 lb

## 2011-12-09 DIAGNOSIS — R197 Diarrhea, unspecified: Secondary | ICD-10-CM

## 2011-12-09 NOTE — Progress Notes (Signed)
Primary Care Physician:  [name], MD, MD Primary Gastroenterologist:  Dr.   Pre-Procedure History & Physical: HPI:  Kyle [name] is a 78 y.o. male here for 3 month history of diarrhea with intermittent fecal incontinence. Seen here one month. Stool studies came back negative. In particular, C. difficile negative. Had been on doxycycline long-term since stopped that medication. Takes align daily. Did have a multiple episodes of fecal incontinence. He states more recently diarrhea and incontinence of tapered off although he still has a poorly formed stool but admits sometimes he has a relatively normal stool and really has to strain. On Questran 2 g orally daily as an off label medication. Did not take Imodium. He is one of his friends prescription antidiarrheals on couple of occasions. History of colon adenomas last colonoscopy 2 years ago poor prep small adenoma found. Recent CT scan demonstrated multiple abdominal wall hernias and renal cyst along with a AAA. Also of note he did have a fused SI joint. Past Medical History  Diagnosis Date  . Arteriosclerotic cardiovascular disease (ASCVD) 1973, 12/2010    S/P NSTEMI secondary to distal RCA/PL lesion, tx medically.  EF of  55%-60% per  echo.  . Chronic kidney disease     Creatinine 1.4 on discharge 12/20/2100; proteinuria; normal renal ultrasound in 2010; recent creatinines of 1.7-2.; Bilateral cystic renal masses by CT in 2011  . Diabetes mellitus     Type II  . Thrombocytopenia   . COPD (chronic obstructive pulmonary disease)   . AAA (abdominal aortic aneurysm) 2004    s/p repair 2004; 4.3 cm infrarenal in 05/2011  . Tobacco abuse     50-pack-year consumption; quit in 12/2010  . Hyperlipidemia   . OSA (obstructive sleep apnea)   . Hypertension   . Benign prostatic hypertrophy     s/p transurethral resection of the prostate  . Obesity   . Nephrolithiasis 2012    ARF in 01/2011 due to obstructing nephrolithiasis  . Tubular adenoma of colon  02/20/10    Colon polyps 2003, 2006 as well  . Cataract   . Ventral hernia   . Diverticulosis   . Bilateral renal masses     Cystic, more prominent on CT in 12/2010 than 2007; followed by Dr. Rosana Hoes  . Bladder cancer 1996    Transurethral resection of the bladder + chemotherapy/BCG as premed  . Adenocarcinoma, lung 01/2011    transthoracic FNA; resection of the superior segment of the RLL in 03/2011; negative nodes; no chemotherapy nor radiation planned    Past Surgical History  Procedure Date  . Abdominal aortic aneurysm repair 2004  . Cardiac catheterization   . Transurethral resection of prostate   . Cystectomy   . Tonsillectomy   . Colonoscopy 03/19/2010    Dr. Gala Romney -(poor prep) Anal papilla, rectal hyperplastic polyp, tubular adenoma removed splenic flexure, left-sided diverticula  . Wedge resection 04/2011    carcinoma of lung    Prior to Admission medications   Medication Sig Start Date End Date Taking? Authorizing Provider  acetaminophen (TYLENOL) 500 MG tablet Take 500 mg by mouth every 6 (six) hours as needed.     Yes Historical Provider, MD  albuterol (PROAIR HFA) 108 (90 BASE) MCG/ACT inhaler Inhale 2 puffs into the lungs every 6 (six) hours as needed for wheezing. 03/12/11 03/11/12 Yes Kathee Delton, MD  ALPRAZolam Duanne Moron) 0.5 MG tablet Take 0.5 mg by mouth at bedtime as needed.     Yes Historical Provider, MD  aspirin 81 MG tablet Take 81 mg by mouth daily.     Yes Historical Provider, MD  cetirizine (ZYRTEC) 10 MG tablet Take 10 mg by mouth daily.     Yes Historical Provider, MD  cholestyramine Lucrezia Starch) 4 G packet Take 0.5 packets by mouth daily. Do not take this medication within 2 hours of any of your other medications 11/19/11 11/18/12 Yes Andria Meuse, NP  Citalopram Hydrobromide (CELEXA PO) Take 20 mg by mouth daily.    Yes Historical Provider, MD  ergocalciferol (VITAMIN D2) 50000 UNITS capsule Take 50,000 Units by mouth every 30 (thirty) days.    Yes Historical  Provider, MD  ferrous sulfate (FERROUSUL) 325 (65 FE) MG tablet Take 325 mg by mouth daily with breakfast.     Yes Historical Provider, MD  FIBER PO Take by mouth daily.   Yes Historical Provider, MD  fluticasone (VERAMYST) 27.5 MCG/SPRAY nasal spray Place 2 sprays into the nose as needed.    Yes Historical Provider, MD  lisinopril (PRINIVIL,ZESTRIL) 10 MG tablet Take 1 tablet (10 mg total) by mouth daily. To replace Amlodipine 08/04/11 08/03/12 Yes Yehuda Savannah, MD  NIASPAN 500 MG CR tablet Take 1,000 mg by mouth at bedtime.  11/27/10  Yes Historical Provider, MD  NITROSTAT 0.4 MG SL tablet Place 0.4 mg under the tongue every 5 (five) minutes as needed.  12/21/10  Yes Historical Provider, MD  pravastatin (PRAVACHOL) 80 MG tablet Take 80 mg by mouth daily.  11/27/10  Yes Historical Provider, MD  Probiotic Product (ALIGN PO) Take by mouth.   Yes Historical Provider, MD  Propylhexedrine [facility] NA) as needed.    Yes Historical Provider, MD  tiotropium (SPIRIVA) 18 MCG inhalation capsule Place 1 capsule (18 mcg total) into inhaler and inhale daily. 10/06/11  Yes Kathee Delton, MD  simethicone (MYLICON) 80 MG chewable tablet Chew 1 tablet (80 mg total) by mouth 2 (two) times daily as needed for flatulence. 11/19/11 11/29/11  Andria Meuse, NP    Allergies as of 12/09/2011  . (No Known Allergies)    Family History  Problem Relation Age of Onset  . Aortic aneurysm Mother   . Emphysema Father     smoker  . Stroke Paternal Grandmother   . Other Paternal Grandfather     brain aneurysm  . Clotting disorder Father   . Arthritis Father     History   Social History  . Marital Status: Married    Spouse Name: N/A    Number of Children: 2  . Years of Education: N/A   Occupational History  . retired from [employer]  . Smoking status: Former Smoker -- 1.0 packs/day for 50 years    Types: Cigarettes    Quit date: 12/20/2010  . Smokeless tobacco: Never Used  .  Alcohol Use: No  . Drug Use: No  . Sexually Active: No   Other Topics Concern  . Not on file   Social History Narrative  . No narrative on file    Review of Systems: See HPI, otherwise negative ROS  Physical Exam: BP 140/73  Pulse 67  Temp(Src) 98.1 F (36.7 C) (Temporal)  Ht 6' (1.829 m)  Wt 273 lb 3.2 oz (123.923 kg)  BMI 37.05 kg/m2 General:   Alert,  Well-developed, well-nourished, pleasant and cooperative in NAD Skin:  Intact without significant lesions or rashes. Eyes:  Sclera clear, no icterus.   Conjunctiva pink. Ears:  Normal auditory acuity. Nose:  No deformity, discharge,  or lesions. Mouth:  No deformity or lesions. Neck:  Supple; no masses or thyromegaly. No significant cervical adenopathy. Lungs:  Clear throughout to auscultation.   No wheezes, crackles, or rhonchi. No acute distress. Heart:  Regular rate and rhythm; no murmurs, clicks, rubs,  or gallops. Abdomen: Rotund, multiple surgical scars. Abdominal wall defects consistent with hernias-easily reducible noted. normal bowel sounds.  Soft and nontender without appreciable mass or hepatosplenomegaly.  Pulses:  Normal pulses noted. Extremities:  Without clubbing or edema.

## 2011-12-09 NOTE — Assessment & Plan Note (Signed)
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 Stool studies negative. I am certainly concerned about the possibility of at least antibiotic associated diarrhea but not out and out C. difficile associated diarrhea.  We have not ruled out microscopic or collagenous colitis. Likewise we have not ruled out new-onset inflammatory bowel disease at this time. I doubt small bowel bacterial overgrowth or pancreatic dysfunction at this time.  Recommendations: Talked about the pros and cons of further evaluation including a colonoscopy with segmental biopsies. At this time, patient feels that he is on the way to the recovery was to continue on his current regimen. I feel this is certainly not unreasonable.  Recommendations: Continue Questran 2 g daily. Patient admonished not to take his medication within 2 hours of his other medication dosing times.  Keep a stool diary. Office followup with [place] in 4-6 weeks.

## 2011-12-09 NOTE — Patient Instructions (Signed)
Continue Cholestryamine 2 grams daily  Keep a stool diary   Office visit in 1 month

## 2011-12-16 ENCOUNTER — Ambulatory Visit: Payer: Medicare Other | Admitting: Urgent Care

## 2012-01-09 ENCOUNTER — Encounter: Payer: Self-pay | Admitting: Gastroenterology

## 2012-01-09 ENCOUNTER — Ambulatory Visit (INDEPENDENT_AMBULATORY_CARE_PROVIDER_SITE_OTHER): Payer: Medicare Other | Admitting: Gastroenterology

## 2012-01-09 VITALS — BP 124/66 | HR 66 | Temp 97.5°F | Ht 72.0 in | Wt 271.4 lb

## 2012-01-09 DIAGNOSIS — R197 Diarrhea, unspecified: Secondary | ICD-10-CM

## 2012-01-09 NOTE — Assessment & Plan Note (Signed)
Several month h/o diarrhea with intermittent fecal incontinence. Microscopic/collangenous colitis, new-onset IBD, SBBO not ruled-out but patient clinically much improved on Questran. He would like to come off the medication to see if diarrhea recurs. Basically at this point, he is having days without BMs, and mostly have firm stool. Therefore, it would be reasonable to taper off Questran. He will skip every other day and continues to do well then he will stop [place]. If recurrent diarrhea, he will resume and let us know.   He has c/o gas. Switch metamucil to benefiber or fiberchoice. Gas/bloat sheet provided and discussed with patient. He has likely received full benefit from probiotics at this point as he has been on Align for several months. Therefore, we will stop the Align.   Call with further GI issues.

## 2012-01-09 NOTE — Patient Instructions (Signed)
Start decreasing questran dose. Skip every other day for one week and then you may stop altogether if doing well. You may stop Align (probiotic). Continue fiber supplement daily. You may consider switching to Benefiber of Fiberchoice as they cause less gas/bloating. Keep a stool diary. If you find that you still have bowel issues, call to discuss.  Bloating Bloating is the feeling of fullness in your belly. You may feel as though your pants are too tight. Often the cause of bloating is overeating, retaining fluids, or having gas in your bowel. It is also caused by swallowing air and eating foods that cause gas. Irritable bowel syndrome is one of the most common causes of bloating. Constipation is also a common cause. Sometimes more serious problems can cause bloating. SYMPTOMS  Usually there is a feeling of fullness, as though your abdomen is bulged out. There may be mild discomfort.  DIAGNOSIS  Usually no particular testing is necessary for most bloating. If the condition persists and seems to become worse, your caregiver may do additional testing.  TREATMENT   There is no direct treatment for bloating.   Do not put gas into the bowel. Avoid chewing gum and sucking on candy. These tend to make you swallow air. Swallowing air can also be a nervous habit. Try to avoid this.   Avoiding high residue diets will help. Eat foods with soluble fibers (examples include root vegetables, apples, or barley) and substitute dairy products with soy and rice products. This helps irritable bowel syndrome.   If constipation is the cause, then a high residue diet with more fiber will help.   Avoid carbonated beverages.   Over-the-counter preparations are available that help reduce gas. Your pharmacist can help you with this.  SEEK MEDICAL CARE IF:   Bloating continues and seems to be getting worse.   You notice a weight gain.   You have a weight loss but the bloating is getting worse.   You have changes  in your bowel habits or develop nausea or vomiting.  SEEK IMMEDIATE MEDICAL CARE IF:   You develop shortness of breath or swelling in your legs.   You have an increase in abdominal pain or develop chest pain.  Document Released: 05/07/2006 Document Revised: 06/26/2011 Document Reviewed: 06/25/2007 [facility] Patient Information 2012 [place].

## 2012-01-09 NOTE — Progress Notes (Signed)
Faxed to PCP

## 2012-01-09 NOTE — Progress Notes (Signed)
Primary Care Physician: [name], MD  Primary Gastroenterologist:  Garfield Cornea, MD   Chief Complaint  Patient presents with  . Follow-up    doing better    HPI: Kyle [name] is a 78 y.o. male here for f/u of several month h/o diarrhea with intermittent fecal incontinence. Stool studies negative. CT did not explain symptoms. TSH normal in 2012. Weight stable. Started on low Questran. Has been on Align and metamucil chronically.   He brought stool diary which started on 12/10/11 through today. He had loose stool one day. Basically having from no BM to firm BM to normal BM daily. C/O lots of flatulence. Never tried the Gas-X which was advised during telephone conversation. Patient states he is feeling much better and wants to try to eliminate some of his GI meds.    Current Outpatient Prescriptions  Medication Sig Dispense Refill  . acetaminophen (TYLENOL) 500 MG tablet Take 500 mg by mouth every 6 (six) hours as needed.        Marland Kitchen albuterol (PROAIR HFA) 108 (90 BASE) MCG/ACT inhaler Inhale 2 puffs into the lungs every 6 (six) hours as needed for wheezing.  1 Inhaler  1  . ALPRAZolam (XANAX) 0.5 MG tablet Take 0.5 mg by mouth at bedtime as needed.        Marland Kitchen aspirin 81 MG tablet Take 81 mg by mouth daily.        . cetirizine (ZYRTEC) 10 MG tablet Take 10 mg by mouth daily.        . cholestyramine (QUESTRAN) 4 G packet Take 0.5 packets by mouth daily. Do not take this medication within 2 hours of any of your other medications  30 each  5  . Citalopram Hydrobromide (CELEXA PO) Take 20 mg by mouth daily.       . ergocalciferol (VITAMIN D2) 50000 UNITS capsule Take 50,000 Units by mouth every 30 (thirty) days.       . ferrous sulfate (FERROUSUL) 325 (65 FE) MG tablet Take 325 mg by mouth daily with breakfast.        . FIBER PO Take by mouth daily.      . fluticasone (VERAMYST) 27.5 MCG/SPRAY nasal spray Place 2 sprays into the nose as needed.       Marland Kitchen lisinopril (PRINIVIL,ZESTRIL) 10 MG tablet  Take 1 tablet (10 mg total) by mouth daily. To replace Amlodipine  90 tablet  3  . metoprolol succinate (TOPROL-XL) 25 MG 24 hr tablet Take 50 mg by mouth 2 (two) times daily.      Marland Kitchen NIASPAN 500 MG CR tablet Take 1,000 mg by mouth at bedtime.       Marland Kitchen NITROSTAT 0.4 MG SL tablet Place 0.4 mg under the tongue every 5 (five) minutes as needed.       . pravastatin (PRAVACHOL) 80 MG tablet Take 80 mg by mouth daily.       . Probiotic Product (ALIGN PO) Take by mouth.      . tiotropium (SPIRIVA) 18 MCG inhalation capsule Place 1 capsule (18 mcg total) into inhaler and inhale daily.  90 capsule  4  . DISCONTD: budesonide-formoterol (SYMBICORT) 160-4.5 MCG/ACT inhaler Inhale 2 puffs into the lungs 2 (two) times daily.          Allergies as of 01/09/2012  . (No Known Allergies)    ROS:  General: Negative for anorexia, weight loss, fever, chills, fatigue, weakness. ENT: Negative for hoarseness, difficulty swallowing , nasal congestion. CV: Negative for  chest pain, angina, palpitations, dyspnea on exertion, peripheral edema.  Respiratory: Negative for dyspnea at rest, dyspnea on exertion, cough, sputum, wheezing.  GI: See history of present illness. GU:  Negative for dysuria, hematuria, urinary incontinence, urinary frequency, nocturnal urination.  Endo: Negative for unusual weight change.    Physical Examination:   BP 124/66  Pulse 66  Temp 97.5 F (36.4 C) (Temporal)  Ht 6' (1.829 m)  Wt 271 lb 6.4 oz (123.106 kg)  BMI 36.81 kg/m2  General: Well-nourished, well-developed in no acute distress.  Eyes: No icterus. Mouth: Oropharyngeal mucosa moist and pink , no lesions erythema or exudate. Lungs: Clear to auscultation bilaterally.  Heart: Regular rate and rhythm, no murmurs rubs or gallops.  Abdomen: Bowel sounds are normal, nontender, nondistended, no hepatosplenomegaly or masses, no abdominal bruit, no rebound or guarding. He has large ventral hernia.   Extremities: No lower extremity  edema. No clubbing or deformities. Neuro: Alert and oriented x 4   Skin: Warm and dry, no jaundice.   Psych: Alert and cooperative, normal mood and affect.

## 2012-02-09 ENCOUNTER — Ambulatory Visit: Payer: Medicare Other | Admitting: Pulmonary Disease

## 2012-02-12 ENCOUNTER — Telehealth: Payer: Self-pay | Admitting: Pulmonary Disease

## 2012-02-12 NOTE — Telephone Encounter (Signed)
Called spoke with Elmo Putt w/ [facility] who stated that she was unsure why [facility] was attempting to do an ONO.  Called [facility], spoke with Purnese who stated that the order was signed by [facility] on 02-10-12 to requalify pt for o2.  However, it is documented that pt only uses o2 at bedtime in our system but per Purnese pt needs to requalify for continuous o2 per pt's insurance.  Unsure where this is coming from.  Purnese was supposed to give me the number to the therapist that goes to pt's homes to requalify thru [facility]) but I was transferred instead to the voicemail of a woman named Acupuncturist.  ATC Purnese back but got her voicemail.  Left a detailed message that I was transferred to Tina's VM and that she needs to call back.  When she returns call, we need:  Antonietta Jewel' number so that we can call to inquire about why the pt was set up for an ONO When [facility] received the order for continuous o2

## 2012-02-12 NOTE — Telephone Encounter (Signed)
NOTE: caller says that orders state pt is to have ONO done "on room air". Mariann Laster

## 2012-02-16 NOTE — Telephone Encounter (Signed)
I called [facility] - received Purnese's VM - [email or web address] to follow up on this

## 2012-02-17 ENCOUNTER — Telehealth: Payer: Self-pay | Admitting: Pulmonary Disease

## 2012-02-17 ENCOUNTER — Ambulatory Visit (INDEPENDENT_AMBULATORY_CARE_PROVIDER_SITE_OTHER): Payer: Medicare Other | Admitting: Pulmonary Disease

## 2012-02-17 ENCOUNTER — Encounter: Payer: Self-pay | Admitting: Pulmonary Disease

## 2012-02-17 VITALS — BP 116/62 | HR 57 | Temp 97.7°F | Ht 72.0 in | Wt 269.2 lb

## 2012-02-17 DIAGNOSIS — J449 Chronic obstructive pulmonary disease, unspecified: Secondary | ICD-10-CM

## 2012-02-17 NOTE — Telephone Encounter (Signed)
[email or web address].Kyrianna Barletta, CMA  

## 2012-02-17 NOTE — Patient Instructions (Addendum)
No change in medications Work on weight loss and exercise, but stay out of heat if greater than 85 degress. followup with me in 70mos if doing well.

## 2012-02-17 NOTE — Telephone Encounter (Signed)
Spoke with pt and notified of results per [facility] and he verbalized understanding and states will continue to use o2 at hs 2 lpm.

## 2012-02-17 NOTE — Assessment & Plan Note (Signed)
The patient has known moderate COPD, and overall is doing well on his current regimen.  I have encouraged him to work aggressively on weight loss, and some type of exercise program.  He is to stay on his nocturnal oxygen, and apparently has had a recent ONO that i will track down.

## 2012-02-17 NOTE — Telephone Encounter (Signed)
I called [facility], spoke with Kyle [name] who requested I speak directly with Kyle [name] who is taking care of this.  Per Kyle [name], Kyle [name] order was placed by Kyle [name] in March.  The ONO was done but not the way it needed to be for insurance to cover.  Therefore, this test needed to be redone for insurance to cover.  Kyle [name] states it was redone on July 25 and per Kyle [name], pt does still qualify.  States she has faxed this info to insurance co and Kyle [name].  She will refax results to triage as OV note today with Kyle [name] says he will track these results down.  Nothing further needed per Kyle [name].  Called Kyle [name] with [facility].  I informed her of above per Kyle [name].  She verbalized understanding of this and was appreciative of [place] following up and updating her.  Nothing further needed at this time.    Note: ONO results from 02/12/12 received and given to [place].

## 2012-02-17 NOTE — Telephone Encounter (Signed)
We need to make sure the pt is on 2 liters at night.  Let him know his followup ONO verified that he does need oxygen at hs.

## 2012-02-17 NOTE — Telephone Encounter (Signed)
Per Crystal J., she received the fax and gave results to [facility]. Is anything else needed? Pls advise.

## 2012-02-17 NOTE — Progress Notes (Signed)
  Subjective:    Patient ID: Kyle [name], male    DOB: November 15, 1933, 78 y.o.   MRN: [id number]  HPI Patient comes in today for followup of his known COPD.  He has not had an acute exacerbation or pulmonary infection since his last visit.  He is continuing on Spiriva, and feels that his breathing is stable.  He denies any significant cough or congestion.   Review of Systems  Constitutional: Negative for fever and unexpected weight change.  HENT: Positive for sneezing. Negative for ear pain, nosebleeds, congestion, sore throat, rhinorrhea, trouble swallowing, dental problem, postnasal drip and sinus pressure.   Eyes: Positive for itching. Negative for redness.  Respiratory: Positive for cough. Negative for chest tightness, shortness of breath and wheezing.   Cardiovascular: Negative for palpitations and leg swelling.  Gastrointestinal: Negative for nausea and vomiting.  Genitourinary: Negative for dysuria.  Musculoskeletal: Negative for joint swelling.  Skin: Negative for rash.  Neurological: Negative for headaches.  Hematological: Bruises/bleeds easily.  Psychiatric/Behavioral: Negative for dysphoric mood. The patient is not nervous/anxious.   All other systems reviewed and are negative.       Objective:   Physical Exam Obese male in no acute distress Nose without purulence or discharge noted Chest with mild decrease in breath sounds, no wheezes or crackles. Cardiac exam with regular rate and rhythm Lower extremities without edema, no cyanosis Alert and oriented, moves all 4 extremities.       Assessment & Plan:

## 2012-02-17 NOTE — Telephone Encounter (Signed)
Kyle [name], this pt tells me he had a recent [facility] from [facility]. Please see if they can fax over ( he told me about a week ago).  Thanks.

## 2012-03-02 ENCOUNTER — Encounter: Payer: Self-pay | Admitting: Pulmonary Disease

## 2012-03-02 ENCOUNTER — Ambulatory Visit (HOSPITAL_COMMUNITY)
Admission: RE | Admit: 2012-03-02 | Discharge: 2012-03-02 | Disposition: A | Discharge status: Home / Self Care | Payer: Medicare Other | Source: Ambulatory Visit | Attending: Oncology | Admitting: Oncology

## 2012-03-02 ENCOUNTER — Other Ambulatory Visit (HOSPITAL_COMMUNITY): Payer: Self-pay | Admitting: Oncology

## 2012-03-02 DIAGNOSIS — C3490 Malignant neoplasm of unspecified part of unspecified bronchus or lung: Secondary | ICD-10-CM

## 2012-03-02 DIAGNOSIS — Z09 Encounter for follow-up examination after completed treatment for conditions other than malignant neoplasm: Secondary | ICD-10-CM | POA: Insufficient documentation

## 2012-03-02 DIAGNOSIS — Z85118 Personal history of other malignant neoplasm of bronchus and lung: Secondary | ICD-10-CM | POA: Insufficient documentation

## 2012-03-02 DIAGNOSIS — R911 Solitary pulmonary nodule: Secondary | ICD-10-CM | POA: Insufficient documentation

## 2012-06-09 ENCOUNTER — Telehealth: Payer: Self-pay

## 2012-06-09 NOTE — Telephone Encounter (Signed)
[name] RN with [facility] care management called- she went out to see pt today and he still c/o diarrhea. She stated he was having episodes of uncontrollable diarrhea every 1-2 weeks, usually happens after a meal and sometimes in the middle of the night. He is not taking a probiotic and is not taking [place]. He told the RN that he was not as bad as it was before but he is always worried when he leaves his house that he will have an accident while he is out.    Pt wants to know if there is anything he can do?  She is aware that RMR and LSL are not in the office at the moment and she said she would call the pt and let him know. She said this was a non-urgent issue.

## 2012-06-10 DIAGNOSIS — N400 Enlarged prostate without lower urinary tract symptoms: Secondary | ICD-10-CM | POA: Insufficient documentation

## 2012-06-10 NOTE — Telephone Encounter (Signed)
Tried to call pt- left message with details.  [name], please schedule pt appt with extender. Thanks.

## 2012-06-10 NOTE — Telephone Encounter (Signed)
Needs office visit with extender

## 2012-06-10 NOTE — Telephone Encounter (Signed)
You can ask RMR his opinion but given that patient has not been seen in 5 months, I would recommend an OV.

## 2012-06-15 ENCOUNTER — Encounter: Payer: Self-pay | Admitting: Internal Medicine

## 2012-06-15 NOTE — Telephone Encounter (Signed)
Mailed appt card to patient for OV on 06/23/12 at 2pm with AS

## 2012-06-22 ENCOUNTER — Other Ambulatory Visit: Payer: Self-pay | Admitting: Dermatology

## 2012-06-22 ENCOUNTER — Encounter: Payer: Self-pay | Admitting: Internal Medicine

## 2012-06-23 ENCOUNTER — Ambulatory Visit (INDEPENDENT_AMBULATORY_CARE_PROVIDER_SITE_OTHER): Payer: Medicare Other | Admitting: Gastroenterology

## 2012-06-23 ENCOUNTER — Encounter: Payer: Self-pay | Admitting: Gastroenterology

## 2012-06-23 VITALS — BP 126/67 | HR 67 | Temp 98.2°F | Ht 72.0 in | Wt 280.0 lb

## 2012-06-23 DIAGNOSIS — R197 Diarrhea, unspecified: Secondary | ICD-10-CM

## 2012-06-23 NOTE — Progress Notes (Signed)
Referring Provider: Mikey Kirschner, MD Primary Care Physician:  [name], MD DR. Gala Romney   Chief Complaint  Patient presents with  . Follow-up    HPI:   Pleasant 78 year old male who presents today in f/u secondary to continued loose stools. Last TCS by Dr. Gala Romney in August 2011: Anal papilla, rectal hyperplastic polyp, tubular adenoma removed splenic flexure, left-sided diverticula. Last seen July 2013. Has had negative stool studies, CT without explanation. Started on low-dose Questran in past, Align, Metamucil. At last visit, he wanted to eliminate some GI meds. Instructions were given to decrease questran dose.   Has been placed on abx two different times since last visit. +fecal incontinence both times. Nervous about bowel movements. Doesn't take much to "get me to a bathroom". Afraid to leave home. No rectal bleeding. No abdominal pain. Off Questran. Not taking supplemental fiber. No further incontinence after off abx. No diarrhea currently.   Past Medical History  Diagnosis Date  . Arteriosclerotic cardiovascular disease (ASCVD) 1973, 12/2010    S/P NSTEMI secondary to distal RCA/PL lesion, tx medically.  EF of  55%-60% per  echo.  . Chronic kidney disease     Creatinine 1.4 on discharge 12/20/2100; proteinuria; normal renal ultrasound in 2010; recent creatinines of 1.7-2.; Bilateral cystic renal masses by CT in 2011  . Diabetes mellitus     Type II  . Thrombocytopenia   . COPD (chronic obstructive pulmonary disease)   . AAA (abdominal aortic aneurysm) 2004    s/p repair 2004; 4.3 cm infrarenal in 05/2011  . Tobacco abuse     50-pack-year consumption; quit in 12/2010  . Hyperlipidemia   . OSA (obstructive sleep apnea)   . Hypertension   . Benign prostatic hypertrophy     s/p transurethral resection of the prostate  . Obesity   . Nephrolithiasis 2012    ARF in 01/2011 due to obstructing nephrolithiasis  . Tubular adenoma of colon 02/20/10    Colon polyps 2003, 2006 as well  .  Cataract   . Ventral hernia   . Diverticulosis   . Bilateral renal masses     Cystic, more prominent on CT in 12/2010 than 2007; followed by Dr. Rosana Hoes  . Bladder cancer 1996    Transurethral resection of the bladder + chemotherapy/BCG as premed  . Adenocarcinoma, lung 01/2011    transthoracic FNA; resection of the superior segment of the RLL in 03/2011; negative nodes; no chemotherapy nor radiation planned    Past Surgical History  Procedure Date  . Abdominal aortic aneurysm repair 2004  . Cardiac catheterization   . Transurethral resection of prostate   . Cystectomy   . Tonsillectomy   . Colonoscopy 03/19/2010    Dr. Gala Romney -(poor prep) Anal papilla, rectal hyperplastic polyp, tubular adenoma removed splenic flexure, left-sided diverticula  . Wedge resection 04/2011    carcinoma of lung  . Colonoscopy 11/28/2004    RMR:  Diminutive rectal and left colon polyps as described above, cold  biopsied/removed/  Left sided diverticula. The remainder of the colonic mucosa appeared normal.  . Colonoscopy  09/15/01    RMR: Multiple diminutive polyps destroyed with dermolysis as described above/ Multiple small polyps on stalks in the colon resected with snare cautery/ Scattered pan colonic diverticulum/ The remainder of the colonic mucosa appeared normal    Current Outpatient Prescriptions  Medication Sig Dispense Refill  . acetaminophen (TYLENOL) 500 MG tablet Take 500 mg by mouth every 6 (six) hours as needed.        Marland Kitchen  ALPRAZolam (XANAX) 0.5 MG tablet Take 0.5 mg by mouth at bedtime as needed.        Marland Kitchen aspirin 81 MG tablet Take 81 mg by mouth daily.        . cetirizine (ZYRTEC) 10 MG tablet Take 10 mg by mouth daily.        . Citalopram Hydrobromide (CELEXA PO) Take 20 mg by mouth daily.       . ergocalciferol (VITAMIN D2) 50000 UNITS capsule Take 50,000 Units by mouth every 30 (thirty) days.       . ferrous sulfate (FERROUSUL) 325 (65 FE) MG tablet Take 325 mg by mouth daily with breakfast.         . FIBER PO Take by mouth daily.      . fluticasone (VERAMYST) 27.5 MCG/SPRAY nasal spray Place 2 sprays into the nose as needed.       Marland Kitchen lisinopril (PRINIVIL,ZESTRIL) 10 MG tablet Take 1 tablet (10 mg total) by mouth daily. To replace Amlodipine  90 tablet  3  . metoprolol succinate (TOPROL-XL) 25 MG 24 hr tablet Take 50 mg by mouth 2 (two) times daily.      Marland Kitchen NIASPAN 500 MG CR tablet Take 1,000 mg by mouth at bedtime.       Marland Kitchen NITROSTAT 0.4 MG SL tablet Place 0.4 mg under the tongue every 5 (five) minutes as needed.       . pravastatin (PRAVACHOL) 80 MG tablet Take 80 mg by mouth daily.       Marland Kitchen tiotropium (SPIRIVA) 18 MCG inhalation capsule Place 1 capsule (18 mcg total) into inhaler and inhale daily.  90 capsule  4  . albuterol (PROAIR HFA) 108 (90 BASE) MCG/ACT inhaler Inhale 2 puffs into the lungs every 6 (six) hours as needed for wheezing.  1 Inhaler  1  . [DISCONTINUED] budesonide-formoterol (SYMBICORT) 160-4.5 MCG/ACT inhaler Inhale 2 puffs into the lungs 2 (two) times daily.          Allergies as of 06/23/2012  . (No Known Allergies)    Family History  Problem Relation Age of Onset  . Aortic aneurysm Mother   . Emphysema Father     smoker  . Stroke Paternal Grandmother   . Other Paternal Grandfather     brain aneurysm  . Clotting disorder Father   . Arthritis Father     History   Social History  . Marital Status: Married    Spouse Name: N/A    Number of Children: 2  . Years of Education: N/A   Occupational History  . retired from [employer]  . Smoking status: Former Smoker -- 1.0 packs/day for 50 years    Types: Cigarettes    Quit date: 12/20/2010  . Smokeless tobacco: Never Used  . Alcohol Use: No  . Drug Use: No  . Sexually Active: No   Other Topics Concern  . None   Social History Narrative  . None    Review of Systems: Gen: Denies fever, chills, anorexia. Denies fatigue, weakness, weight loss.  CV: Denies chest  pain, palpitations, syncope, peripheral edema, and claudication. Resp: +cough GI: Denies vomiting blood, jaundice, and fecal incontinence.   Denies dysphagia or odynophagia. Derm: Denies rash, itching, dry skin Psych: age-related memory changes Heme: Denies bruising, bleeding, and enlarged lymph nodes.  Physical Exam: BP 126/67  Pulse 67  Temp 98.2 F (36.8 C) (Oral)  Ht 6' (1.829 m)  Wt 280 lb (  127.007 kg)  BMI 37.97 kg/m2 General:   Alert and oriented. No distress noted. Pleasant and cooperative.  Head:  Normocephalic and atraumatic. Eyes:  Conjuctiva clear without scleral icterus. Ears: HOH Neck:  Supple, without mass or thyromegaly. Heart:  S1, S2 present without murmurs, rubs, or gallops. Regular rate and rhythm. Abdomen:  +BS, soft, non-tender and non-distended. Large ventral hernia at level of umbilicus, moderate sized ventral hernia also noted to right of midline, superior to umbilicus.  Msk:  Symmetrical without gross deformities. Normal posture. Extremities:  Without edema. Neurologic:  Alert and  oriented x4;  grossly normal neurologically. Skin:  Intact without significant lesions or rashes. Psych:  Alert and cooperative. Normal mood and affect.

## 2012-06-23 NOTE — Patient Instructions (Addendum)
Please complete the stool sample. We will call you with the results.   Resume taking fiber supplements daily.   We will see you in 3 months.

## 2012-06-24 NOTE — Assessment & Plan Note (Signed)
Hx of diarrhea with negative stool studies, improved in past with off-label Questran. He has weaned off Questran in interim from last visit; however, notes recurrence of fecal incontinence X 2 in the presence of abx. Doubt dealing with Cdiff, but with his long hx of diarrhea, will recheck. Hold off on institution of [place]. Will add back supplemental fiber daily. Return in 3 mos or sooner if needed. Hopefully, he will do well with simply fiber addition. If diarrhea returns, consider TCS with segmental biopsies to assess for microscopic colitis.

## 2012-06-24 NOTE — Progress Notes (Signed)
Faxed to PCP

## 2012-06-28 ENCOUNTER — Encounter (HOSPITAL_COMMUNITY): Payer: Medicare Other | Attending: Oncology

## 2012-06-28 ENCOUNTER — Other Ambulatory Visit (HOSPITAL_COMMUNITY): Payer: Medicare Other

## 2012-06-28 DIAGNOSIS — E669 Obesity, unspecified: Secondary | ICD-10-CM | POA: Insufficient documentation

## 2012-06-28 DIAGNOSIS — R197 Diarrhea, unspecified: Secondary | ICD-10-CM | POA: Insufficient documentation

## 2012-06-28 DIAGNOSIS — C679 Malignant neoplasm of bladder, unspecified: Secondary | ICD-10-CM | POA: Insufficient documentation

## 2012-06-28 DIAGNOSIS — D696 Thrombocytopenia, unspecified: Secondary | ICD-10-CM

## 2012-06-28 DIAGNOSIS — Z09 Encounter for follow-up examination after completed treatment for conditions other than malignant neoplasm: Secondary | ICD-10-CM | POA: Insufficient documentation

## 2012-06-28 DIAGNOSIS — Z85118 Personal history of other malignant neoplasm of bronchus and lung: Secondary | ICD-10-CM | POA: Insufficient documentation

## 2012-06-28 LAB — CBC
Hemoglobin: 14.9 g/dL (ref 13.0–17.0)
MCH: 32 pg (ref 26.0–34.0)
MCV: 95.3 fL (ref 78.0–100.0)
Platelets: 105 10*3/uL — ABNORMAL LOW (ref 150–400)
RBC: 4.65 MIL/uL (ref 4.22–5.81)
WBC: 6.7 10*3/uL (ref 4.0–10.5)

## 2012-06-28 NOTE — Progress Notes (Signed)
Labs drawn today for cbc 

## 2012-06-30 ENCOUNTER — Encounter (HOSPITAL_BASED_OUTPATIENT_CLINIC_OR_DEPARTMENT_OTHER): Payer: Medicare Other | Admitting: Oncology

## 2012-06-30 VITALS — BP 137/80 | HR 61 | Temp 97.5°F | Resp 20 | Wt 276.0 lb

## 2012-06-30 DIAGNOSIS — D696 Thrombocytopenia, unspecified: Secondary | ICD-10-CM

## 2012-06-30 DIAGNOSIS — C3430 Malignant neoplasm of lower lobe, unspecified bronchus or lung: Secondary | ICD-10-CM

## 2012-06-30 DIAGNOSIS — R197 Diarrhea, unspecified: Secondary | ICD-10-CM

## 2012-06-30 NOTE — Progress Notes (Signed)
Problem #1 stage I a (T1 B., N0, M0) moderately differentiated adenocarcinoma the lung, 2.3 cm in size with surgery on 04/03/2011. Margins were clear, no LV I was seen, and there is no pleural involvement. 6 lymph nodes were all negative getting stage IA disease. He is here for routine followup. 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 He Is on too many medications to withdraw them completely. There is no evidence on his April CT scan of splenomegaly or cirrhosis. Problem #3 obesity weighing 276 pounds and 6 feet tall Problem #4 intermittent diarrhea treated by GI Problem #5 bladder cancer followed by Dr. Rosana Hoes Problem #6 MI x2 the last in May 2012 Problem #7 use abdominal hernia secondary to abdominal aortic aneurysm surgery in 2008 Problem #8 double vision in the past which has not been a recent issue Problem #9 history of skin cancer He states he has felt the best lately that he has felt in several years. He looks stable but his weight is up 6 pounds compared to May. Other vital signs are stable including blood pressure. It was slightly elevated when he arrived here.  We need to schedule his CT scan follow his blood work in February and see him back. I need to continue to evaluate his thrombocytopenia. I have reviewed his CT scan with Dr. Ardeen Garland in radiology and once again he does not have cirrhosis or splenomegaly.

## 2012-06-30 NOTE — Patient Instructions (Addendum)
[facility] Discharge Instructions  RECOMMENDATIONS MADE BY THE CONSULTANT AND ANY TEST RESULTS WILL BE SENT TO YOUR REFERRING PHYSICIAN.  EXAM FINDINGS BY THE PHYSICIAN TODAY AND SIGNS OR SYMPTOMS TO REPORT TO CLINIC OR PRIMARY PHYSICIAN: Exam and discussion by MD.  Kyle [name] are doing well.  Blood work is stable.  MEDICATIONS PRESCRIBED:  none  INSTRUCTIONS GIVEN AND DISCUSSED: Report unusual cough, increased shortness of breath, blood in your sputum, unusual bruising or bleeding.  SPECIAL INSTRUCTIONS/FOLLOW-UP: Return for blood work, CT of Chest and to see MD in February.  Thank you for choosing [facility] to provide your oncology and hematology care.  To afford each patient quality time with our providers, please arrive at least 15 minutes before your scheduled appointment time.  With your help, our goal is to use those 15 minutes to complete the necessary work-up to ensure our physicians have the information they need to help with your evaluation and healthcare recommendations.    Effective January 1st, 2014, we ask that you re-schedule your appointment with our physicians should you arrive 10 or more minutes late for your appointment.  We strive to give you quality time with our providers, and arriving late affects you and other patients whose appointments are after yours.    Again, thank you for choosing [facility].  Our hope is that these requests will decrease the amount of time that you wait before being seen by our physicians.       _____________________________________________________________  I acknowledge that I have been informed and understand all the instructions given to me and received a copy. I do not have anymore questions at this time but understand that I may call the [facility] at [phone] during business hours should I have any further questions or need assistance in obtaining follow-up  care.    __________________________________________  _____________  __________ Signature of Patient or Authorized Representative            Date                   Time    __________________________________________ Nurse's Signature

## 2012-07-19 ENCOUNTER — Other Ambulatory Visit: Payer: Self-pay | Admitting: Cardiology

## 2012-07-21 HISTORY — PX: FLEXIBLE SIGMOIDOSCOPY: SHX1649

## 2012-07-22 NOTE — Progress Notes (Signed)
appt made for March °

## 2012-07-22 NOTE — Progress Notes (Signed)
Quick Note:  Negative Cdiff. If continued diarrhea, institute Questran 1/2 packet daily to every other day.  Follow-up as planned for 3 mos f/u.   ______

## 2012-07-28 ENCOUNTER — Ambulatory Visit: Payer: Medicare Other | Admitting: Cardiology

## 2012-07-28 DIAGNOSIS — F329 Major depressive disorder, single episode, unspecified: Secondary | ICD-10-CM | POA: Insufficient documentation

## 2012-07-28 DIAGNOSIS — F32A Depression, unspecified: Secondary | ICD-10-CM | POA: Insufficient documentation

## 2012-08-19 ENCOUNTER — Ambulatory Visit (INDEPENDENT_AMBULATORY_CARE_PROVIDER_SITE_OTHER): Payer: Medicare Other | Admitting: Pulmonary Disease

## 2012-08-19 ENCOUNTER — Encounter: Payer: Self-pay | Admitting: Pulmonary Disease

## 2012-08-19 VITALS — BP 140/86 | HR 63 | Temp 98.0°F | Ht 72.0 in | Wt 281.8 lb

## 2012-08-19 DIAGNOSIS — J449 Chronic obstructive pulmonary disease, unspecified: Secondary | ICD-10-CM

## 2012-08-19 NOTE — Assessment & Plan Note (Signed)
The patient is doing well on his current breathing regimen, and I have stressed to him the importance of weight loss and conditioning in improving his overall quality of life.  I will see him back in 6 months, but if he is doing well, we can spread this out to yearly.

## 2012-08-19 NOTE — Progress Notes (Signed)
  Subjective:    Patient ID: Kyle [name], male    DOB: 09/20/1933, 79 y.o.   MRN: [id number]  HPI The patient comes in today for followup of his known COPD.  He has done well from a breathing standpoint, with no recent pulmonary infection or acute exacerbation.  He is staying on Spiriva compliantly, and rarely has to use his rescue inhaler.  He denies any significant cough or mucus production.   Review of Systems  Constitutional: Negative for fever and unexpected weight change.  HENT: Positive for congestion, rhinorrhea and postnasal drip. Negative for ear pain, nosebleeds, sore throat, sneezing, trouble swallowing, dental problem and sinus pressure.   Eyes: Negative for redness and itching.  Respiratory: Negative for cough, chest tightness, shortness of breath and wheezing.   Cardiovascular: Positive for leg swelling ( ankle swelling). Negative for palpitations.  Gastrointestinal: Negative for nausea and vomiting.  Genitourinary: Negative for dysuria.  Musculoskeletal: Negative for joint swelling.  Skin: Negative for rash.  Neurological: Negative for headaches.  Hematological: Does not bruise/bleed easily.  Psychiatric/Behavioral: Negative for dysphoric mood. The patient is nervous/anxious.        Objective:   Physical Exam Obese male in no acute distress Nose without purulent discharge noted Neck without lymphadenopathy thyromegaly Chest with minimally decreased breath sounds, no wheezing Cardiac exam is regular rate and rhythm Lower extremities 1+ edema, no cyanosis Alert and oriented, moves all 4 extremities.       Assessment & Plan:

## 2012-08-19 NOTE — Patient Instructions (Addendum)
Stay on current breathing medications Work on weight loss and conditioning. followup with me in 88mos, but if doing well next visit, will consider changing to yearly.

## 2012-08-24 ENCOUNTER — Ambulatory Visit (INDEPENDENT_AMBULATORY_CARE_PROVIDER_SITE_OTHER): Payer: Medicare Other | Admitting: Adult Health

## 2012-08-24 ENCOUNTER — Encounter: Payer: Self-pay | Admitting: Adult Health

## 2012-08-24 VITALS — BP 110/70 | HR 86 | Ht 72.0 in | Wt 281.0 lb

## 2012-08-24 DIAGNOSIS — I10 Essential (primary) hypertension: Secondary | ICD-10-CM

## 2012-08-24 DIAGNOSIS — I714 Abdominal aortic aneurysm, without rupture: Secondary | ICD-10-CM

## 2012-08-24 DIAGNOSIS — I2510 Atherosclerotic heart disease of native coronary artery without angina pectoris: Secondary | ICD-10-CM

## 2012-08-24 DIAGNOSIS — I7090 Unspecified atherosclerosis: Secondary | ICD-10-CM

## 2012-08-24 NOTE — Assessment & Plan Note (Signed)
Followed by nephrology and has had recent ureter stents placed after biopsy in January of 2014.

## 2012-08-24 NOTE — Progress Notes (Signed)
HPI: Mr. Wilcken is a 79 y/o patient of Dr. Lattie Haw we are seeing for ongoing assessment and treatment of CAD and CVRF, with history of right lower lung lobectomy for neoplastic disease. He has been doing well without cardiac complaint. He was recently in the hospital for OP procedure for stent placement to ureters after kidney biopsy for abnormal cells found in urine per pathology. He also has a history of AAA followed by [facility] with CT most recently completed in December. Otherwise he is doing well. Breathing status is about the same. No chest pain, or weakness. Remains active.  No Known Allergies  Current Outpatient Prescriptions  Medication Sig Dispense Refill  . acetaminophen (TYLENOL) 500 MG tablet Take 500 mg by mouth every 6 (six) hours as needed.        Marland Kitchen albuterol (PROAIR HFA) 108 (90 BASE) MCG/ACT inhaler Inhale 2 puffs into the lungs every 6 (six) hours as needed for wheezing.  1 Inhaler  1  . ALPRAZolam (XANAX) 0.5 MG tablet Take 0.5 mg by mouth at bedtime as needed.        Marland Kitchen aspirin 81 MG tablet Take 81 mg by mouth daily.        . cetirizine (ZYRTEC) 10 MG tablet Take 10 mg by mouth daily.       . Citalopram Hydrobromide (CELEXA PO) Take 20 mg by mouth daily.       . ergocalciferol (VITAMIN D2) 50000 UNITS capsule Take 50,000 Units by mouth every 30 (thirty) days.       . ferrous sulfate (FERROUSUL) 325 (65 FE) MG tablet Take 325 mg by mouth daily with breakfast.        . fluticasone (VERAMYST) 27.5 MCG/SPRAY nasal spray Place 2 sprays into the nose as needed.       Marland Kitchen lisinopril (PRINIVIL,ZESTRIL) 10 MG tablet TAKE 1 TABLET EVERY DAY  90 tablet  3  . metoprolol succinate (TOPROL-XL) 25 MG 24 hr tablet Take 50 mg by mouth 2 (two) times daily.      Marland Kitchen NIASPAN 500 MG CR tablet Take 1,000 mg by mouth at bedtime.       Marland Kitchen NITROSTAT 0.4 MG SL tablet Place 0.4 mg under the tongue every 5 (five) minutes as needed.       . pravastatin (PRAVACHOL) 80 MG tablet Take 80 mg by mouth daily.        Marland Kitchen tiotropium (SPIRIVA) 18 MCG inhalation capsule Place 1 capsule (18 mcg total) into inhaler and inhale daily.  90 capsule  4  . [DISCONTINUED] budesonide-formoterol (SYMBICORT) 160-4.5 MCG/ACT inhaler Inhale 2 puffs into the lungs 2 (two) times daily.          Past Medical History  Diagnosis Date  . Arteriosclerotic cardiovascular disease (ASCVD) 1973, 12/2010    S/P NSTEMI secondary to distal RCA/PL lesion, tx medically.  EF of  55%-60% per  echo.  . Chronic kidney disease     Creatinine 1.4 on discharge 12/20/2100; proteinuria; normal renal ultrasound in 2010; recent creatinines of 1.7-2.; Bilateral cystic renal masses by CT in 2011  . Diabetes mellitus     Type II  . Thrombocytopenia   . COPD (chronic obstructive pulmonary disease)   . AAA (abdominal aortic aneurysm) 2004    s/p repair 2004; 4.3 cm infrarenal in 05/2011  . Tobacco abuse     50-pack-year consumption; quit in 12/2010  . Hyperlipidemia   . OSA (obstructive sleep apnea)   . Hypertension   .  Benign prostatic hypertrophy     s/p transurethral resection of the prostate  . Obesity   . Nephrolithiasis 2012    ARF in 01/2011 due to obstructing nephrolithiasis  . Tubular adenoma of colon 02/20/10    Colon polyps 2003, 2006 as well  . Cataract   . Ventral hernia   . Diverticulosis   . Bilateral renal masses     Cystic, more prominent on CT in 12/2010 than 2007; followed by Dr. Rosana Hoes  . Bladder cancer 1996    Transurethral resection of the bladder + chemotherapy/BCG as premed  . Adenocarcinoma, lung 01/2011    transthoracic FNA; resection of the superior segment of the RLL in 03/2011; negative nodes; no chemotherapy nor radiation planned    Past Surgical History  Procedure Date  . Abdominal aortic aneurysm repair 2004  . Cardiac catheterization   . Transurethral resection of prostate   . Cystectomy   . Tonsillectomy   . Colonoscopy 03/19/2010    Dr. Gala Romney -(poor prep) Anal papilla, rectal hyperplastic polyp, tubular  adenoma removed splenic flexure, left-sided diverticula  . Wedge resection 04/2011    carcinoma of lung  . Colonoscopy 11/28/2004    RMR:  Diminutive rectal and left colon polyps as described above, cold  biopsied/removed/  Left sided diverticula. The remainder of the colonic mucosa appeared normal.  . Colonoscopy  09/15/01    RMR: Multiple diminutive polyps destroyed with dermolysis as described above/ Multiple small polyps on stalks in the colon resected with snare cautery/ Scattered pan colonic diverticulum/ The remainder of the colonic mucosa appeared normal    ROS: Review of systems complete and found to be negative unless listed above  PHYSICAL EXAM BP 110/70  Pulse 86  Ht 6' (1.829 m)  Wt 281 lb (127.461 kg)  BMI 38.11 kg/m2  General: Well developed, well nourished,obese, in no acute distress Head: Eyes PERRLA, No xanthomas.   Normal cephalic and atramatic  Lungs: Clear bilaterally to auscultation diminished bibasilar.  Heart: HRRR S1 S2, distant heart sounds. without MRG.  Pulses are 2+ & equal.            No carotid bruit. No JVD.  No abdominal bruits. No femoral bruits. Abdomen: Bowel sounds are positive, abdomen soft and non-tender without masses or                  Hernia's noted. Msk:  Back normal, normal gait. Normal strength and tone for age. Extremities: No clubbing, cyanosis or edema.  DP +1 Neuro: Alert and oriented X 3. Psych:  Good affect, responds appropriately  EKG: NSR rate of 86 bpm.  ASSESSMENT AND PLAN

## 2012-08-24 NOTE — Progress Notes (Deleted)
Name: Kyle [name]    DOB: 1934/03/22  Age: 79 y.o.  MR#: [id number]       PCP:  [name], MD      Insurance: @PAYORNAME @   CC:   No chief complaint on file.   VS BP 110/70  Pulse 86  Ht 6' (1.829 m)  Wt 281 lb (127.461 kg)  BMI 38.11 kg/m2  Weights Current Weight  08/24/12 281 lb (127.461 kg)  08/19/12 281 lb 12.8 oz (127.824 kg)  06/30/12 276 lb (125.193 kg)    Blood Pressure  BP Readings from Last 3 Encounters:  08/24/12 110/70  08/19/12 140/86  06/30/12 137/80     Admit date:  (Not on file) Last encounter with RMR:  Visit date not found   Allergy No Known Allergies  Current Outpatient Prescriptions  Medication Sig Dispense Refill  . acetaminophen (TYLENOL) 500 MG tablet Take 500 mg by mouth every 6 (six) hours as needed.        Marland Kitchen albuterol (PROAIR HFA) 108 (90 BASE) MCG/ACT inhaler Inhale 2 puffs into the lungs every 6 (six) hours as needed for wheezing.  1 Inhaler  1  . ALPRAZolam (XANAX) 0.5 MG tablet Take 0.5 mg by mouth at bedtime as needed.        Marland Kitchen aspirin 81 MG tablet Take 81 mg by mouth daily.        . cetirizine (ZYRTEC) 10 MG tablet Take 10 mg by mouth daily.       . Citalopram Hydrobromide (CELEXA PO) Take 20 mg by mouth daily.       . ergocalciferol (VITAMIN D2) 50000 UNITS capsule Take 50,000 Units by mouth every 30 (thirty) days.       . ferrous sulfate (FERROUSUL) 325 (65 FE) MG tablet Take 325 mg by mouth daily with breakfast.        . fluticasone (VERAMYST) 27.5 MCG/SPRAY nasal spray Place 2 sprays into the nose as needed.       Marland Kitchen lisinopril (PRINIVIL,ZESTRIL) 10 MG tablet TAKE 1 TABLET EVERY DAY  90 tablet  3  . metoprolol succinate (TOPROL-XL) 25 MG 24 hr tablet Take 50 mg by mouth 2 (two) times daily.      Marland Kitchen NIASPAN 500 MG CR tablet Take 1,000 mg by mouth at bedtime.       Marland Kitchen NITROSTAT 0.4 MG SL tablet Place 0.4 mg under the tongue every 5 (five) minutes as needed.       . pravastatin (PRAVACHOL) 80 MG tablet Take 80 mg by mouth daily.       Marland Kitchen  tiotropium (SPIRIVA) 18 MCG inhalation capsule Place 1 capsule (18 mcg total) into inhaler and inhale daily.  90 capsule  4  . [DISCONTINUED] budesonide-formoterol (SYMBICORT) 160-4.5 MCG/ACT inhaler Inhale 2 puffs into the lungs 2 (two) times daily.          Discontinued Meds:   There are no discontinued medications.  Patient Active Problem List  Diagnosis  . Arteriosclerotic cardiovascular disease (ASCVD)  . Hypertension  . CKD (chronic kidney disease) stage 3, GFR 30-59 ml/min  . COPD (chronic obstructive pulmonary disease)  . Adenocarcinoma, lung  . Obstructive sleep apnea  . Fasting hyperglycemia  . Nephrolithiasis  . Bladder carcinoma  . Diarrhea  . Tubular adenoma of colon  . Ventral hernia  . AAA (abdominal aortic aneurysm)  . Tobacco abuse  . LLQ pain    LABS Infusion on 06/28/2012  Component Date Value  . WBC 06/28/2012 6.7   .  RBC 06/28/2012 4.65   . Hemoglobin 06/28/2012 14.9   . HCT 06/28/2012 44.3   . MCV 06/28/2012 95.3   . [facility] 06/28/2012 32.0   . MCHC 06/28/2012 33.6   . RDW 06/28/2012 15.5   . Platelets 06/28/2012 105*  Office Visit on 06/23/2012  Component Date Value  . C difficile by pcr 06/28/2012 Not Detected      Results for this Opt Visit:     Results for orders placed in visit on 06/28/12  CBC      Component Value Range   WBC 6.7  4.0 - 10.5 K/uL   RBC 4.65  4.22 - 5.81 MIL/uL   Hemoglobin 14.9  13.0 - 17.0 g/dL   HCT 44.3  39.0 - 52.0 %   MCV 95.3  78.0 - 100.0 fL   MCH 32.0  26.0 - 34.0 pg   MCHC 33.6  30.0 - 36.0 g/dL   RDW 15.5  11.5 - 15.5 %   Platelets 105 (*) 150 - 400 K/uL    EKG Orders placed in visit on 08/24/12  . EKG 12-LEAD     Prior Assessment and Plan Problem List as of 08/24/2012            Cardiology Problems   Arteriosclerotic cardiovascular disease (ASCVD)   Last Assessment & Plan Note   10/28/2011 Office Visit Addendum 10/28/2011 11:17 PM by Yehuda Savannah, MD    Patient tolerated resection of lung  neoplasm without apparent cardiac complications and remains asymptomatic at present.  We will continue to attempt to optimally manage risk factors.  He is 10 months out from a non-ST segment elevation myocardial infarction with coronary anatomy not amenable to revascularization.  Clopidogrel can be discontinued in 2 months.    Hypertension   Last Assessment & Plan Note   10/28/2011 Office Visit Signed 10/28/2011 11:22 PM by Yehuda Savannah, MD    Blood pressure control has been generally good over the past 2 years with occasional systolics as high as [id number].  Current medication will be continued.    AAA (abdominal aortic aneurysm)     Other   CKD (chronic kidney disease) stage 3, GFR 30-59 ml/min   Last Assessment & Plan Note   10/28/2011 Office Visit Signed 10/28/2011 11:20 PM by Yehuda Savannah, MD    Renal function moderately impaired and stable.  Receiving no nephrotoxic drugs.    COPD (chronic obstructive pulmonary disease)   Last Assessment & Plan Note   08/19/2012 Office Visit Signed 08/19/2012  9:22 AM by Kathee Delton, MD    The patient is doing well on his current breathing regimen, and I have stressed to him the importance of weight loss and conditioning in improving his overall quality of life.  I will see him back in 6 months, but if he is doing well, we can spread this out to yearly.    Adenocarcinoma, lung   Last Assessment & Plan Note   10/28/2011 Office Visit Signed 10/28/2011 11:11 PM by Yehuda Savannah, MD    Patient has recovered completely following surgery.  Ongoing surveillance will be via the chest clinic at [facility].    Obstructive sleep apnea   Fasting hyperglycemia   Nephrolithiasis   Bladder carcinoma   Last Assessment & Plan Note   10/28/2011 Office Visit Signed 10/28/2011 11:28 PM by Yehuda Savannah, MD    Bilateral renal cystic masses by CT scan in 12/2010, progressive when compared to a previous  study performed in 2007-Followed by Dr. Rosana Hoes     Diarrhea   Last  Assessment & Plan Note   06/23/2012 Office Visit Signed 06/24/2012  1:54 PM by Orvil Feil, NP    Hx of diarrhea with negative stool studies, improved in past with off-label Questran. He has weaned off Questran in interim from last visit; however, notes recurrence of fecal incontinence X 2 in the presence of abx. Doubt dealing with Cdiff, but with his long hx of diarrhea, will recheck. Hold off on institution of [place]. Will add back supplemental fiber daily. Return in 3 mos or sooner if needed. Hopefully, he will do well with simply fiber addition. If diarrhea returns, consider TCS with segmental biopsies to assess for microscopic colitis.     Tubular adenoma of colon   Ventral hernia   Tobacco abuse   LLQ pain       Imaging: No results found.   FRS Calculation: Score not calculated. Missing: Total Cholesterol

## 2012-08-24 NOTE — Patient Instructions (Addendum)
Your physician recommends that you schedule a follow-up appointment in: 1 year  

## 2012-08-24 NOTE — Assessment & Plan Note (Signed)
He is followed by Kyle [name] for annual evaluation and management of with CT scan. Most recent was December of 2013.

## 2012-08-24 NOTE — Assessment & Plan Note (Signed)
Excellent control of blood pressure on this visit. No changes in management.

## 2012-08-24 NOTE — Assessment & Plan Note (Signed)
He is doing well without cardiac complaints. Had NSTEMI 2 years ago and continues on risk factor management. Is due to have labs drawn by [facility] next week. Will request copies. Continue current medical management. See again in 6 months.

## 2012-08-27 ENCOUNTER — Encounter (HOSPITAL_COMMUNITY): Payer: Medicare Other | Attending: Oncology

## 2012-08-27 DIAGNOSIS — C3430 Malignant neoplasm of lower lobe, unspecified bronchus or lung: Secondary | ICD-10-CM

## 2012-08-27 DIAGNOSIS — D693 Immune thrombocytopenic purpura: Secondary | ICD-10-CM | POA: Insufficient documentation

## 2012-08-27 DIAGNOSIS — D696 Thrombocytopenia, unspecified: Secondary | ICD-10-CM | POA: Insufficient documentation

## 2012-08-27 DIAGNOSIS — C3490 Malignant neoplasm of unspecified part of unspecified bronchus or lung: Secondary | ICD-10-CM | POA: Insufficient documentation

## 2012-08-27 LAB — COMPREHENSIVE METABOLIC PANEL
ALT: 13 U/L (ref 0–53)
CO2: 23 mEq/L (ref 19–32)
Calcium: 9.2 mg/dL (ref 8.4–10.5)
Creatinine, Ser: 1.71 mg/dL — ABNORMAL HIGH (ref 0.50–1.35)
GFR calc Af Amer: 43 mL/min — ABNORMAL LOW (ref >90)
GFR calc non Af Amer: 37 mL/min — ABNORMAL LOW (ref >90)
Glucose, Bld: 174 mg/dL — ABNORMAL HIGH (ref 70–99)
Total Bilirubin: 0.7 mg/dL (ref 0.3–1.2)

## 2012-08-27 LAB — CBC WITH DIFFERENTIAL/PLATELET
Eosinophils Relative: 6 % — ABNORMAL HIGH (ref 0–5)
HCT: 43.6 % (ref 39.0–52.0)
Hemoglobin: 14.6 g/dL (ref 13.0–17.0)
Lymphocytes Relative: 32 % (ref 12–46)
Lymphs Abs: 1.8 10*3/uL (ref 0.7–4.0)
MCV: 93.8 fL (ref 78.0–100.0)
Monocytes Absolute: 0.3 10*3/uL (ref 0.1–1.0)
RBC: 4.65 MIL/uL (ref 4.22–5.81)
WBC: 5.8 10*3/uL (ref 4.0–10.5)

## 2012-08-27 NOTE — Progress Notes (Signed)
Kyle [name] presented for labwork. Labs per MD order drawn via Peripheral Line 25 gauge needle inserted in RAC. Procedure without incident.  Patient tolerated procedure well.

## 2012-08-30 ENCOUNTER — Telehealth (HOSPITAL_COMMUNITY): Payer: Self-pay | Admitting: Oncology

## 2012-08-30 LAB — FOLATE RBC: RBC Folate: 766 ng/mL — ABNORMAL HIGH (ref >=366)

## 2012-08-30 NOTE — Telephone Encounter (Signed)
test

## 2012-08-31 ENCOUNTER — Ambulatory Visit (HOSPITAL_COMMUNITY)
Admission: RE | Admit: 2012-08-31 | Discharge: 2012-08-31 | Disposition: A | Discharge status: Home / Self Care | Payer: Medicare Other | Source: Ambulatory Visit | Attending: Oncology | Admitting: Oncology

## 2012-08-31 DIAGNOSIS — I10 Essential (primary) hypertension: Secondary | ICD-10-CM | POA: Insufficient documentation

## 2012-08-31 DIAGNOSIS — R932 Abnormal findings on diagnostic imaging of liver and biliary tract: Secondary | ICD-10-CM | POA: Insufficient documentation

## 2012-08-31 DIAGNOSIS — Z9889 Other specified postprocedural states: Secondary | ICD-10-CM | POA: Insufficient documentation

## 2012-08-31 DIAGNOSIS — J449 Chronic obstructive pulmonary disease, unspecified: Secondary | ICD-10-CM | POA: Insufficient documentation

## 2012-08-31 DIAGNOSIS — C3490 Malignant neoplasm of unspecified part of unspecified bronchus or lung: Secondary | ICD-10-CM | POA: Insufficient documentation

## 2012-08-31 DIAGNOSIS — E119 Type 2 diabetes mellitus without complications: Secondary | ICD-10-CM | POA: Insufficient documentation

## 2012-08-31 DIAGNOSIS — J4489 Other specified chronic obstructive pulmonary disease: Secondary | ICD-10-CM | POA: Insufficient documentation

## 2012-09-01 ENCOUNTER — Ambulatory Visit (HOSPITAL_COMMUNITY): Payer: Medicare Other | Admitting: Oncology

## 2012-09-02 ENCOUNTER — Ambulatory Visit (HOSPITAL_COMMUNITY): Payer: Medicare Other | Admitting: Oncology

## 2012-09-02 LAB — METHYLMALONIC ACID, SERUM: Methylmalonic Acid, Quantitative: 0.37 umol/L (ref <0.40)

## 2012-09-07 ENCOUNTER — Encounter (HOSPITAL_COMMUNITY): Payer: Self-pay | Admitting: Oncology

## 2012-09-07 ENCOUNTER — Encounter (HOSPITAL_BASED_OUTPATIENT_CLINIC_OR_DEPARTMENT_OTHER): Payer: Medicare Other | Admitting: Oncology

## 2012-09-07 VITALS — BP 123/77 | HR 68 | Temp 97.7°F | Resp 18 | Wt 277.0 lb

## 2012-09-07 DIAGNOSIS — C3490 Malignant neoplasm of unspecified part of unspecified bronchus or lung: Secondary | ICD-10-CM

## 2012-09-07 DIAGNOSIS — D696 Thrombocytopenia, unspecified: Secondary | ICD-10-CM

## 2012-09-07 DIAGNOSIS — C3430 Malignant neoplasm of lower lobe, unspecified bronchus or lung: Secondary | ICD-10-CM

## 2012-09-07 DIAGNOSIS — R7309 Other abnormal glucose: Secondary | ICD-10-CM

## 2012-09-07 DIAGNOSIS — N289 Disorder of kidney and ureter, unspecified: Secondary | ICD-10-CM

## 2012-09-07 DIAGNOSIS — D693 Immune thrombocytopenic purpura: Secondary | ICD-10-CM

## 2012-09-07 HISTORY — DX: Immune thrombocytopenic purpura: D69.3

## 2012-09-07 NOTE — Patient Instructions (Addendum)
[facility] Discharge Instructions  RECOMMENDATIONS MADE BY THE CONSULTANT AND ANY TEST RESULTS WILL BE SENT TO YOUR REFERRING PHYSICIAN.  EXAM FINDINGS BY THE PHYSICIAN TODAY AND SIGNS OR SYMPTOMS TO REPORT TO CLINIC OR PRIMARY PHYSICIAN: exam and discussion by PA.  You are doing well.  MEDICATIONS PRESCRIBED:  none  INSTRUCTIONS GIVEN AND DISCUSSED: Report cough, blood in your sputum, unexplained weight loss,etc.  SPECIAL INSTRUCTIONS/FOLLOW-UP: Blood work and scans in 6 months and to be seen in follow-up after the scans.  Thank you for choosing [facility] to provide your oncology and hematology care.  To afford each patient quality time with our providers, please arrive at least 15 minutes before your scheduled appointment time.  With your help, our goal is to use those 15 minutes to complete the necessary work-up to ensure our physicians have the information they need to help with your evaluation and healthcare recommendations.    Effective January 1st, 2014, we ask that you re-schedule your appointment with our physicians should you arrive 10 or more minutes late for your appointment.  We strive to give you quality time with our providers, and arriving late affects you and other patients whose appointments are after yours.    Again, thank you for choosing [facility].  Our hope is that these requests will decrease the amount of time that you wait before being seen by our physicians.       _____________________________________________________________  Should you have questions after your visit to [facility], please contact our office at [phone] between the hours of 8:30 a.m. and 5:00 p.m.  Voicemails left after 4:30 p.m. will not be returned until the following business day.  For prescription refill requests, have your pharmacy contact our office with your prescription refill request.

## 2012-09-07 NOTE — Progress Notes (Signed)
[name], MD [place] [address]  Adenocarcinoma, lung  ITP (idiopathic thrombocytopenic purpura)  CURRENT THERAPY: Observation and surveillance  INTERVAL HISTORY: YEELENG SMET 79 y.o. male returns for  regular  visit for followup of  Stage I a (T1 B., N0, M0) moderately differentiated adenocarcinoma the lung, 2.3 cm in size with surgery on 04/03/2011. Margins were clear, no LV I was seen, and there is no pleural involvement. 6 lymph nodes were all negative getting stage IA disease.  AND Thrombocytopenia thus far consistent with chronic ITP of adults versus drug-induced mild thrombocytopenia. He is on too many medications to withdraw them completely. There is no evidence on his Feb 2014 CT scan of splenomegaly or cirrhosis.   I personally reviewed and went over laboratory results with the patient.  Mr. Cornelia labs are remarkable for stable thrombocytopenia at 98,000, and renal insufficiency.  His platelets are stable and therefore do not require close follow-up.  This is secondary to ITP or medication-induced ITP.  He denies any bleeding.  I personally reviewed and went over radiographic studies with the patient.  His CT of chest is stable without evidence for recurrence of disease.   We reviewed the NCCN guidelines which illustrates CT^ of chest every 6 months x 2 years and then annually.   He denies any hemoptysis.  Oncologically, he denies any complaints and ROS questioning is negative.   Past Medical History  Diagnosis Date  . Arteriosclerotic cardiovascular disease (ASCVD) 1973, 12/2010    S/P NSTEMI secondary to distal RCA/PL lesion, tx medically.  EF of  55%-60% per  echo.  . Chronic kidney disease     Creatinine 1.4 on discharge 12/20/2100; proteinuria; normal renal ultrasound in 2010; recent creatinines of 1.7-2.; Bilateral cystic renal masses by CT in 2011  . Diabetes mellitus     Type II  . Thrombocytopenia   . COPD (chronic obstructive pulmonary  disease)   . AAA (abdominal aortic aneurysm) 2004    s/p repair 2004; 4.3 cm infrarenal in 05/2011  . Tobacco abuse     50-pack-year consumption; quit in 12/2010  . Hyperlipidemia   . OSA (obstructive sleep apnea)   . Hypertension   . Benign prostatic hypertrophy     s/p transurethral resection of the prostate  . Obesity   . Nephrolithiasis 2012    ARF in 01/2011 due to obstructing nephrolithiasis  . Tubular adenoma of colon 02/20/10    Colon polyps 2003, 2006 as well  . Cataract   . Ventral hernia   . Diverticulosis   . Bilateral renal masses     Cystic, more prominent on CT in 12/2010 than 2007; followed by Dr. Rosana Hoes  . Bladder cancer 1996    Transurethral resection of the bladder + chemotherapy/BCG as premed  . Adenocarcinoma, lung 01/2011    transthoracic FNA; resection of the superior segment of the RLL in 03/2011; negative nodes; no chemotherapy nor radiation planned  . ITP (idiopathic thrombocytopenic purpura) 09/07/2012    Chronic ITP of adults versus medication-induced ITP.  Stable    has Arteriosclerotic cardiovascular disease (ASCVD); Hypertension; CKD (chronic kidney disease) stage 3, GFR 30-59 ml/min; COPD (chronic obstructive pulmonary disease); Adenocarcinoma, lung; Obstructive sleep apnea; Fasting hyperglycemia; Nephrolithiasis; Bladder carcinoma; Diarrhea; Tubular adenoma of colon; Ventral hernia; AAA (abdominal aortic aneurysm); Tobacco abuse; LLQ pain; and ITP (idiopathic thrombocytopenic purpura) on his problem list.     is allergic to codeine.  Mr. Beth had no medications administered during  this visit.  Past Surgical History  Procedure Laterality Date  . Abdominal aortic aneurysm repair  2004  . Cardiac catheterization    . Transurethral resection of prostate    . Cystectomy    . Tonsillectomy    . Colonoscopy  03/19/2010    Dr. Gala Romney -(poor prep) Anal papilla, rectal hyperplastic polyp, tubular adenoma removed splenic flexure, left-sided diverticula  . Wedge  resection  04/2011    carcinoma of lung  . Colonoscopy  11/28/2004    RMR:  Diminutive rectal and left colon polyps as described above, cold  biopsied/removed/  Left sided diverticula. The remainder of the colonic mucosa appeared normal.  . Colonoscopy   09/15/01    RMR: Multiple diminutive polyps destroyed with dermolysis as described above/ Multiple small polyps on stalks in the colon resected with snare cautery/ Scattered pan colonic diverticulum/ The remainder of the colonic mucosa appeared normal    Denies any headaches, dizziness, double vision, fevers, chills, night sweats, nausea, vomiting, diarrhea, constipation, chest pain, heart palpitations, shortness of breath, blood in stool, black tarry stool, urinary pain, urinary burning, urinary frequency, hematuria.   PHYSICAL EXAMINATION  ECOG PERFORMANCE STATUS: 0 - Asymptomatic  Filed Vitals:   09/07/12 1138  BP: 123/77  Pulse: 68  Temp: 97.7 F (36.5 C)  Resp: 18    GENERAL:alert, no distress, well nourished, well developed, comfortable, cooperative, obese and smiling SKIN: skin color, texture, turgor are normal, no rashes or significant lesions. No petechiae. HEAD: Normocephalic, No masses, lesions, tenderness or abnormalities EYES: normal, Conjunctiva are pink and non-injected EARS: External ears normal OROPHARYNX:mucous membranes are moist  NECK: supple, no adenopathy, thyroid normal size, non-tender, without nodularity, no stridor, non-tender, trachea midline LYMPH:  no palpable lymphadenopathy BREAST:not examined LUNGS: clear to auscultation and percussion HEART: regular rate & rhythm, no murmurs, no gallops, S1 normal and S2 normal ABDOMEN:abdomen soft, non-tender, obese and normal bowel sounds BACK: Back symmetric, no curvature., No CVA tenderness EXTREMITIES:less then 2 second capillary refill, no joint deformities, effusion, or inflammation, no edema, no skin discoloration, no clubbing, no cyanosis  NEURO: alert &  oriented x 3 with fluent speech, no focal motor/sensory deficits, gait normal   LABORATORY DATA: Results for BOWDEN, ZILLS (MRN [id number]) as of 09/07/2012 11:50  Ref. Range 08/27/2012 09:49  Sodium Latest Range: 135-145 mEq/L 139  Potassium Latest Range: 3.5-5.1 mEq/L 4.2  Chloride Latest Range: 96-112 mEq/L 105  CO2 Latest Range: 19-32 mEq/L 23  BUN Latest Range: 6-23 mg/dL 24 (H)  Creatinine Latest Range: 0.50-1.35 mg/dL 1.71 (H)  Calcium Latest Range: 8.4-10.5 mg/dL 9.2  GFR calc non Af Amer Latest Range: >90 mL/min 37 (L)  GFR calc Af Amer Latest Range: >90 mL/min 43 (L)  Glucose Latest Range: 70-99 mg/dL 174 (H)  Alkaline Phosphatase Latest Range: 39-117 U/L 66  Albumin Latest Range: 3.5-5.2 g/dL 3.7  AST Latest Range: 0-37 U/L 12  ALT Latest Range: 0-53 U/L 13  Total Protein Latest Range: 6.0-8.3 g/dL 7.0  Total Bilirubin Latest Range: 0.3-1.2 mg/dL 0.7  GGT Latest Range: 7-51 U/L 16  RBC Folate Latest Range: >=366 ng/mL 766 (H)  Methylmalonic Acid, Quantitative Latest Range: <0.40 umol/L 0.37  WBC Latest Range: 4.0-10.5 K/uL 5.8  RBC Latest Range: 4.22-5.81 MIL/uL 4.65  Hemoglobin Latest Range: 13.0-17.0 g/dL 14.6  HCT Latest Range: 39.0-52.0 % 43.6  MCV Latest Range: 78.0-100.0 fL 93.8  MCH Latest Range: 26.0-34.0 pg 31.4  MCHC Latest Range: 30.0-36.0 g/dL 33.5  RDW Latest Range:  11.5-15.5 % 15.8 (H)  Platelets Latest Range: 150-400 K/uL 98 (L)  Neutrophils Relative Latest Range: 43-77 % 57  Lymphocytes Relative Latest Range: 12-46 % 32  Monocytes Relative Latest Range: 3-12 % 6  Eosinophils Relative Latest Range: 0-5 % 6 (H)  Basophils Relative Latest Range: 0-1 % 0  NEUT# Latest Range: 1.7-7.7 K/uL 3.3  Lymphocytes Absolute Latest Range: 0.7-4.0 K/uL 1.8  Monocytes Absolute Latest Range: 0.1-1.0 K/uL 0.3  Eosinophils Absolute Latest Range: 0.0-0.7 K/uL 0.3  Basophils Absolute Latest Range: 0.0-0.1 K/uL 0.0      RADIOGRAPHIC STUDIES:  08/31/2012  *RADIOLOGY  REPORT*  Clinical Data: Lung cancer 1.5 years ago, post surgery, follow-up,  past history bladder cancer, abdominal aortic aneurysm with  surgery, hypertension, COPD, smoking, diabetes  CT CHEST WITHOUT CONTRAST  Technique: Multidetector CT imaging of the chest was performed  following the standard protocol without IV contrast. Sagittal and  coronal MPR images reconstructed from axial data set.  Comparison: 03/02/2012  Correlation: CT abdomen 11/17/2011  Findings:  Extensive atherosclerotic calcification aorta, less in coronary  arteries.  Mild aneurysmal dilatation proximal abdominal aorta 4.1 x 4.1 cm  image 71.  Large ventral hernia containing nonobstructed segment of transverse  colon.  Dependent density in gallbladder, question gallstones, visualized  on previous exam.  Small parenchymal calcification noted at head of pancreas,  unchanged.  No definite thoracic adenopathy.  Postsurgical changes and scarring in right lower lobe with  persistent chronic soft tissue opacity centrally 4.2 x 2.0 cm,  overall area of opacity on lung windows approximately 6.4 x 1.7 cm,  little changed.  Tiny calcified granuloma anterior right lung image 31.  No new mass, nodule, infiltrate, or effusion.  No acute osseous findings.  IMPRESSION:  Stable surgical changes and scarring in the right lower lobe  overall little changed.  Large ventral abdominal hernia.  Extensive atherosclerotic calcification with aneurysmal dilatation  of the proximal abdominal aorta.  Suspected cholelithiasis.  Original Report Authenticated By: Lavonia Dana, M.D.    ASSESSMENT:  1. Stage I a (T1 B., N0, M0) moderately differentiated adenocarcinoma the lung, 2.3 cm in size with surgery on 04/03/2011. Margins were clear, no LV I was seen, and there is no pleural involvement. 6 lymph nodes were all negative getting stage IA disease. CT Feb 2014 does not demonstrate recurrence of disease. 2. Thrombocytopenia thus far  consistent with chronic ITP of adults versus drug-induced mild thrombocytopenia. He is on too many medications to withdraw them completely. There is no evidence on his April 2013 CT scan of splenomegaly or cirrhosis. Stable. 3. Obesity weighing 276 pounds and 6 feet tall  4. Intermittent diarrhea treated by GI  5. Bladder cancer followed by Dr. Rosana Hoes  6.  MI x2 the last in May 2012  7. Abdominal hernia secondary to abdominal aortic aneurysm surgery in 2008  8. Double vision in the past which has not been a recent issue  9. History of skin cancer 10. Renal insufficiency, followed by nephrologist 11. Elevated glucose, followed by Dr. Wolfgang Phoenix.   PLAN:  1. I personally reviewed and went over laboratory results with the patient. 2. I personally reviewed and went over radiographic studies with the patient. 3. CT of chest without contrast in 6 months. 4. Per NCCN guidelines, CT of chest every 6 months x 2 years and then annually.   5. After CT of chest in August, will go to annual CT of chest without contrast. 6. Lab work in 6 months: CBC diff,  CMET 7. Return in 6 months after labs and CT of chest for follow-up.    All questions were answered. The patient knows to call the clinic with any problems, questions or concerns. We can certainly see the patient much sooner if necessary.  The patient and plan discussed with Everardo All, MD and he is in agreement with the aforementioned.  Hanin Decook

## 2012-09-24 ENCOUNTER — Ambulatory Visit: Payer: Medicare Other | Admitting: Internal Medicine

## 2012-10-26 ENCOUNTER — Encounter: Payer: Self-pay | Admitting: Internal Medicine

## 2012-10-26 ENCOUNTER — Ambulatory Visit (INDEPENDENT_AMBULATORY_CARE_PROVIDER_SITE_OTHER): Payer: Medicare Other | Admitting: Internal Medicine

## 2012-10-26 VITALS — BP 127/78 | HR 67 | Temp 98.6°F | Ht 72.0 in | Wt 278.8 lb

## 2012-10-26 DIAGNOSIS — R159 Full incontinence of feces: Secondary | ICD-10-CM

## 2012-10-26 DIAGNOSIS — K589 Irritable bowel syndrome without diarrhea: Secondary | ICD-10-CM

## 2012-10-26 NOTE — Patient Instructions (Addendum)
May use Imodium one to 2 tablets as needed for occasional diarrhea  Try Align - probiotic for gas/bloat symptoms  Office visit here in 6 month

## 2012-10-26 NOTE — Progress Notes (Signed)
Primary Care Physician:  [name], MD Primary Gastroenterologist:  Dr. Gala Romney  Pre-Procedure History & Physical: HPI:  Kyle [name] is a 79 y.o. male here for followup of intermittent diarrhea occasional rectal incontinence. Patient has done much better since last being seen here. No longer taking Questran. He provides a stool diary which indicates he is having 3-4 bowel movements weekly and occasionally has multiple bowel movements on a given day. Usually formed. Only a couple bouts of incontinence   after he went out to eat at a restaurant and ate heavy foods. He does have quite a bit of malodorous flatulence in the morning as he reports. He's had extensive evaluation including stool studies. He has not had segmental biopsies of his colon to rule out microscopic colitis. But, again, he is doing much better. History of colonic adenoma; due for surveillance examination 2016.  Patient with a history of a multiple ventral hernias on CT without incarceration.  Past Medical History  Diagnosis Date  . Arteriosclerotic cardiovascular disease (ASCVD) 1973, 12/2010    S/P NSTEMI secondary to distal RCA/PL lesion, tx medically.  EF of  55%-60% per  echo.  . Chronic kidney disease     Creatinine 1.4 on discharge 12/20/2100; proteinuria; normal renal ultrasound in 2010; recent creatinines of 1.7-2.; Bilateral cystic renal masses by CT in 2011  . Diabetes mellitus     Type II  . Thrombocytopenia   . COPD (chronic obstructive pulmonary disease)   . AAA (abdominal aortic aneurysm) 2004    s/p repair 2004; 4.3 cm infrarenal in 05/2011  . Tobacco abuse     50-pack-year consumption; quit in 12/2010  . Hyperlipidemia   . OSA (obstructive sleep apnea)   . Hypertension   . Benign prostatic hypertrophy     s/p transurethral resection of the prostate  . Obesity   . Nephrolithiasis 2012    ARF in 01/2011 due to obstructing nephrolithiasis  . Tubular adenoma of colon 02/20/10    Colon polyps 2003, 2006 as well  .  Cataract   . Ventral hernia   . Diverticulosis   . Bilateral renal masses     Cystic, more prominent on CT in 12/2010 than 2007; followed by Dr. Rosana Hoes  . Bladder cancer 1996    Transurethral resection of the bladder + chemotherapy/BCG as premed  . Adenocarcinoma, lung 01/2011    transthoracic FNA; resection of the superior segment of the RLL in 03/2011; negative nodes; no chemotherapy nor radiation planned  . ITP (idiopathic thrombocytopenic purpura) 09/07/2012    Chronic ITP of adults versus medication-induced ITP.  Stable    Past Surgical History  Procedure Laterality Date  . Abdominal aortic aneurysm repair  2004  . Cardiac catheterization    . Transurethral resection of prostate    . Cystectomy    . Tonsillectomy    . Colonoscopy  03/19/2010    Dr. Gala Romney -(poor prep) Anal papilla, rectal hyperplastic polyp, tubular adenoma removed splenic flexure, left-sided diverticula  . Wedge resection  04/2011    carcinoma of lung  . Colonoscopy  11/28/2004    RMR:  Diminutive rectal and left colon polyps as described above, cold  biopsied/removed/  Left sided diverticula. The remainder of the colonic mucosa appeared normal.  . Colonoscopy   09/15/01    RMR: Multiple diminutive polyps destroyed with dermolysis as described above/ Multiple small polyps on stalks in the colon resected with snare cautery/ Scattered pan colonic diverticulum/ The remainder of the colonic mucosa appeared  normal    Prior to Admission medications   Medication Sig Start Date End Date Taking? Authorizing Provider  acetaminophen (TYLENOL) 500 MG tablet Take 500 mg by mouth every 6 (six) hours as needed.     Yes Historical Provider, MD  Albuterol POWD 1 Inhaler. Inhale 1 Inhaler into the lungs 4 (four) times daily as needed.   Yes Historical Provider, MD  ALPRAZolam (XANAX) 0.5 MG tablet 0.5 mg. Take 0.5 mg by mouth 2 (two) times daily as needed.   Yes Historical Provider, MD  aspirin EC 81 MG tablet 81 mg. Take 81 mg by  mouth daily.   Yes Historical Provider, MD  cetirizine (ZYRTEC) 10 MG tablet Take 10 mg by mouth daily.    Yes Historical Provider, MD  citalopram (CELEXA) 20 MG tablet Take 20 mg by mouth daily. Take 20 mg by mouth daily.   Yes Historical Provider, MD  ergocalciferol (VITAMIN D2) 50000 UNITS capsule Take 50,000 Units by mouth every 30 (thirty) days.    Yes Historical Provider, MD  ferrous sulfate (FERROUSUL) 325 (65 FE) MG tablet Take 325 mg by mouth daily with breakfast.     Yes Historical Provider, MD  fluticasone (FLONASE) 50 MCG/ACT nasal spray 2 sprays. 2 sprays by Nasal route daily as needed.   Yes Historical Provider, MD  lisinopril (PRINIVIL,ZESTRIL) 10 MG tablet TAKE 1 TABLET EVERY DAY 07/19/12  Yes Yehuda Savannah, MD  metoprolol (LOPRESSOR) 50 MG tablet 50 mg. Take 50 mg by mouth 2 times daily.   Yes Historical Provider, MD  niacin 250 MG CR capsule 1,000 mg. Take 1,000 mg by mouth nightly.   Yes Historical Provider, MD  nitroGLYCERIN (NITROSTAT) 0.4 MG SL tablet 0.4 mg as needed. Place 0.4 mg under the tongue every 5 (five) minutes as needed. Call MD if need more than 2   Yes Historical Provider, MD  pravastatin (PRAVACHOL) 80 MG tablet Take 80 mg by mouth daily.  11/27/10  Yes Historical Provider, MD  Tamsulosin HCl (FLOMAX) 0.4 MG CAPS One daily PRN stent pain 07/31/12  Yes Historical Provider, MD  tiotropium (SPIRIVA) 18 MCG inhalation capsule Place 1 capsule (18 mcg total) into inhaler and inhale daily. 10/06/11  Yes Kathee Delton, MD  albuterol [facility] HFA) 108 (90 BASE) MCG/ACT inhaler Inhale 2 puffs into the lungs every 6 (six) hours as needed for wheezing. 03/12/11 09/18/12  Kathee Delton, MD    Allergies as of 10/26/2012 - Review Complete 10/26/2012  Allergen Reaction Noted  . Codeine  09/07/2012    Family History  Problem Relation Age of Onset  . Aortic aneurysm Mother   . Emphysema Father     smoker  . Stroke Paternal Grandmother   . Other Paternal Grandfather      brain aneurysm  . Clotting disorder Father   . Arthritis Father     History   Social History  . Marital Status: Married    Spouse Name: N/A    Number of Children: 2  . Years of Education: N/A   Occupational History  . retired from [employer]  . Smoking status: Former Smoker -- 1.00 packs/day for 50 years    Types: Cigarettes    Quit date: 12/20/2010  . Smokeless tobacco: Never Used  . Alcohol Use: No  . Drug Use: No  . Sexually Active: No   Other Topics Concern  . Not on file   Social History Narrative  .  No narrative on file    Review of Systems: See HPI, otherwise negative ROS  Physical Exam: BP 127/78  Pulse 67  Temp(Src) 98.6 F (37 C) (Oral)  Ht 6' (1.829 m)  Wt 278 lb 12.8 oz (126.463 kg)  BMI 37.8 kg/m2 General:   Elderly gentleman resting comfortably in no acute distress Skin:  Intact without significant lesions or rashes. Eyes:  Sclera clear, no icterus.   Conjunctiva pink. Ears:  Normal auditory acuity. Nose:  No deformity, discharge,  or lesions. Mouth:  No deformity or lesions. Neck:  Supple; no masses or thyromegaly. No significant cervical adenopathy. Lungs:  Clear throughout to auscultation.   No wheezes, crackles, or rhonchi. No acute distress. Heart:  Regular rate and rhythm; no murmurs, clicks, rubs,  or gallops. Abdomen: Obese. Positive bowel sounds. He has at least 2 palpable ventral hernias -1 in the epigastrium and one just to the left the umbilicus. Easily reducible. No mass or organomegaly. Abdomen is nontender. Pulses:  Normal pulses noted. Extremities:  Without clubbing or edema.  Impression/Plan:  79 year old gentleman with intermittent diarrhea and rare episodes of fecal incontinence now overall much improved without specific etiology being determined. I suspect he has an element of irritable bowel syndrome compounded by enteropathy secondary to diabetes.  Sense symptoms have settled down, we'll not  pursue a colonoscopy at this time to evaluate further for possible underlying microscopic colitis.  Gas/flatulent symptoms nonspecific.  Recommendations:   Trial of Align - probiotic daily. Utilize Imodium on a when necessary basis. Would consider using this medication preemptively should  he go out to eat or is traveling. Surveillance colonoscopy 2016. Office visit in 6 months.

## 2012-11-20 ENCOUNTER — Encounter: Payer: Self-pay | Admitting: *Deleted

## 2012-11-22 ENCOUNTER — Other Ambulatory Visit: Payer: Self-pay | Admitting: *Deleted

## 2012-11-22 MED ORDER — TIOTROPIUM BROMIDE MONOHYDRATE 18 MCG IN CAPS: 18.0000 ug | ORAL_CAPSULE | Freq: Every day | RESPIRATORY_TRACT | Status: DC

## 2012-11-29 ENCOUNTER — Telehealth: Payer: Self-pay | Admitting: Pulmonary Disease

## 2012-11-29 NOTE — Telephone Encounter (Signed)
I spoke with Kyle [name]. I advised her I looked in [facility] note and only see where [facility] see's pt for COPD. She state then she will call pt's PCP then and see if dr. Wolfgang Phoenix has been signing for his CPAP supplies. Nothing further was eneded

## 2012-11-30 ENCOUNTER — Encounter: Payer: Self-pay | Admitting: Family Medicine

## 2012-11-30 ENCOUNTER — Ambulatory Visit (INDEPENDENT_AMBULATORY_CARE_PROVIDER_SITE_OTHER): Payer: Medicare Other | Admitting: Family Medicine

## 2012-11-30 VITALS — BP 126/70 | HR 70 | Ht 71.0 in | Wt 281.0 lb

## 2012-11-30 DIAGNOSIS — E1149 Type 2 diabetes mellitus with other diabetic neurological complication: Secondary | ICD-10-CM

## 2012-11-30 DIAGNOSIS — J449 Chronic obstructive pulmonary disease, unspecified: Secondary | ICD-10-CM

## 2012-11-30 DIAGNOSIS — I10 Essential (primary) hypertension: Secondary | ICD-10-CM

## 2012-11-30 DIAGNOSIS — E119 Type 2 diabetes mellitus without complications: Secondary | ICD-10-CM | POA: Insufficient documentation

## 2012-11-30 NOTE — Progress Notes (Signed)
  Subjective:    Patient ID: Kyle [name], male    DOB: Mar 01, 1934, 79 y.o.   MRN: [id number]  Diabetes He has gestational diabetes mellitus. His disease course has been worsening. Pertinent negatives for hypoglycemia include no confusion. Pertinent negatives for diabetes include no blurred vision and no chest pain. Pertinent negatives for hypoglycemia complications include no blackouts. Symptoms are stable. There are no diabetic complications. There are no known risk factors for coronary artery disease. When asked about current treatments, none were reported. His weight is increasing steadily. He is following a diabetic diet. When asked about meal planning, he reported none. There is no change in his home blood glucose trend. His breakfast blood glucose is taken between 8-9 am. His breakfast blood glucose range is generally 130-140 mg/dl.   Saw uro for f u of bladder cancer. Told he had uti and started on cipro--due to finish on Friday. Uncertain when next cystocope. Results for orders placed in visit on 11/30/12  POCT GLYCOSYLATED HEMOGLOBIN (HGB A1C)      Result Value Range   Hemoglobin A1C 7.0     Claims compliance with his blood pressure medication.  Ongoing shortness of breath with exertion but not worse than usual.  Intermittent constip and loose stools. Sees Dr. Rutherford Limerick for this Review of Systems  Eyes: Negative for blurred vision.  Cardiovascular: Negative for chest pain.  Psychiatric/Behavioral: Negative for confusion.   Otherwise negative.    Objective:   Physical Exam  Alert significant obesity present. HEENT normal. Vitals reviewed. Lungs clear diminished breath sounds diffusely. Heart regular rate and rhythm. Feet pulses question distal diminished sensation. intact no edema      Assessment & Plan:  Impression #1 type 2 diabetes good control at 7.0. #2 hypertension good control. #3 COPD clinically stable. Plan maintain all medications. Diet exercise discussed. Recheck in  several months. As per orders. WSL

## 2012-11-30 NOTE — Patient Instructions (Addendum)
Please cut sugars out of your diet

## 2012-12-06 ENCOUNTER — Telehealth: Payer: Self-pay | Admitting: Family Medicine

## 2012-12-06 NOTE — Telephone Encounter (Signed)
Patient needs the mask, tubing and filters for his c-pap machine to [facility]

## 2012-12-06 NOTE — Telephone Encounter (Signed)
Rx written and signed. Patient notified and requested to pick up RX. Notified patient RX is at front desk ready for pickup.

## 2012-12-14 ENCOUNTER — Encounter: Payer: Self-pay | Admitting: Orthopedic Surgery

## 2012-12-14 ENCOUNTER — Ambulatory Visit (INDEPENDENT_AMBULATORY_CARE_PROVIDER_SITE_OTHER): Payer: Medicare Other | Admitting: Orthopedic Surgery

## 2012-12-14 DIAGNOSIS — S52501A Unspecified fracture of the lower end of right radius, initial encounter for closed fracture: Secondary | ICD-10-CM

## 2012-12-14 DIAGNOSIS — S52599A Other fractures of lower end of unspecified radius, initial encounter for closed fracture: Secondary | ICD-10-CM

## 2012-12-14 DIAGNOSIS — S42309A Unspecified fracture of shaft of humerus, unspecified arm, initial encounter for closed fracture: Secondary | ICD-10-CM

## 2012-12-14 MED ORDER — HYDROCODONE-ACETAMINOPHEN 5-325 MG PO TABS: 1.0000 | ORAL_TABLET | ORAL | Status: DC | PRN

## 2012-12-14 NOTE — Progress Notes (Signed)
Patient ID: Kyle [name], male   DOB: [birth year], 79 y.o.   MRN: [id number] Chief Complaint  Patient presents with  . Wrist Injury    Right wrist fracture. DOI 12-13-12.     History this is a 78 room who fell off a golf cart injured his right arm. He was seen in urgent care. X-rays show distal radius fracture. Was placed in appropriate volar splint. Presents now with 4/10 sharp pain which comes and goes associated bruising and swelling  Review of systems negative findings include no complaints of weight gain skin changes poor healing or excessive thirst or urination all other systems head positive findings which included shortness of breath frequency urgency blood in the urine nervousness anxiety and depression  Allergies to codeine  Problems include heart lung and kidney disease status post aneurysm repair status post surgery for lung cancer and bladder cancer  Pharmacy [facility]  Family history heart disease arthritis diabetes  Social history married he does report a smoking history   Vital signs are stable as recorded  General appearance is normal  The patient is alert and oriented x3  The patient's mood and affect are normal  Gait assessment: Unsteady gait   The cardiovascular exam reveals normal pulses and temperature without edema or  swelling.  The lymphatic system is negative for palpable lymph nodes  The sensory exam is normal.  There are no pathologic reflexes. Balance is poor  Exam of the right wrist reveals a lot of swelling in the hand and the wrist with tenderness over the distal radius decreased range of motion, stability of the wrist confirmed by x-ray strength could not assess muscle tone was normal skin was intact no has a large abrasion over his left eye and face  X-ray show nondisplaced fracture right distal radius  Recommend short arm cast 6 weeks x-ray out of plaster

## 2013-01-25 ENCOUNTER — Encounter: Payer: Self-pay | Admitting: Orthopedic Surgery

## 2013-01-25 ENCOUNTER — Ambulatory Visit (INDEPENDENT_AMBULATORY_CARE_PROVIDER_SITE_OTHER): Payer: Medicare Other

## 2013-01-25 ENCOUNTER — Ambulatory Visit (INDEPENDENT_AMBULATORY_CARE_PROVIDER_SITE_OTHER): Payer: Medicare Other | Admitting: Orthopedic Surgery

## 2013-01-25 VITALS — BP 110/62 | Ht 72.0 in | Wt 281.0 lb

## 2013-01-25 DIAGNOSIS — IMO0001 Reserved for inherently not codable concepts without codable children: Secondary | ICD-10-CM

## 2013-01-25 DIAGNOSIS — S62101D Fracture of unspecified carpal bone, right wrist, subsequent encounter for fracture with routine healing: Secondary | ICD-10-CM

## 2013-01-25 NOTE — Patient Instructions (Addendum)
Wear wrist splint x1 month then you can remove and resume normal activity

## 2013-01-25 NOTE — Progress Notes (Signed)
Patient ID: Kyle [name], male   DOB: 03-Mar-1934, 79 y.o.   MRN: [id number] Chief Complaint  Patient presents with  . Follow-up    6 week recheck and xray right wrist OOP.   BP 110/62  Ht 6' (1.829 m)  Wt 281 lb (127.461 kg)  BMI 38.1 kg/m2  Fracture right wrist x-ray out of plaster shows fracture healing  Clinical exam is benign  Recommend splint for 4 weeks and then remove and return to normal activity

## 2013-01-31 ENCOUNTER — Emergency Department (HOSPITAL_COMMUNITY): Payer: Medicare Other

## 2013-01-31 ENCOUNTER — Inpatient Hospital Stay (HOSPITAL_COMMUNITY)
Admission: EM | Admit: 2013-01-31 | Discharge: 2013-02-02 | DRG: 192 | Disposition: A | Discharge status: Home / Self Care | Payer: Medicare Other | Attending: Internal Medicine | Admitting: Internal Medicine

## 2013-01-31 ENCOUNTER — Encounter (HOSPITAL_COMMUNITY): Payer: Self-pay | Admitting: *Deleted

## 2013-01-31 DIAGNOSIS — S52501A Unspecified fracture of the lower end of right radius, initial encounter for closed fracture: Secondary | ICD-10-CM

## 2013-01-31 DIAGNOSIS — Z87891 Personal history of nicotine dependence: Secondary | ICD-10-CM

## 2013-01-31 DIAGNOSIS — C679 Malignant neoplasm of bladder, unspecified: Secondary | ICD-10-CM

## 2013-01-31 DIAGNOSIS — N200 Calculus of kidney: Secondary | ICD-10-CM

## 2013-01-31 DIAGNOSIS — Z9181 History of falling: Secondary | ICD-10-CM

## 2013-01-31 DIAGNOSIS — E119 Type 2 diabetes mellitus without complications: Secondary | ICD-10-CM | POA: Diagnosis present

## 2013-01-31 DIAGNOSIS — Z9221 Personal history of antineoplastic chemotherapy: Secondary | ICD-10-CM

## 2013-01-31 DIAGNOSIS — E785 Hyperlipidemia, unspecified: Secondary | ICD-10-CM | POA: Diagnosis present

## 2013-01-31 DIAGNOSIS — I2510 Atherosclerotic heart disease of native coronary artery without angina pectoris: Secondary | ICD-10-CM

## 2013-01-31 DIAGNOSIS — R1032 Left lower quadrant pain: Secondary | ICD-10-CM

## 2013-01-31 DIAGNOSIS — J441 Chronic obstructive pulmonary disease with (acute) exacerbation: Principal | ICD-10-CM

## 2013-01-31 DIAGNOSIS — Z6836 Body mass index (BMI) 36.0-36.9, adult: Secondary | ICD-10-CM

## 2013-01-31 DIAGNOSIS — R197 Diarrhea, unspecified: Secondary | ICD-10-CM

## 2013-01-31 DIAGNOSIS — Z8551 Personal history of malignant neoplasm of bladder: Secondary | ICD-10-CM

## 2013-01-31 DIAGNOSIS — I252 Old myocardial infarction: Secondary | ICD-10-CM

## 2013-01-31 DIAGNOSIS — D126 Benign neoplasm of colon, unspecified: Secondary | ICD-10-CM

## 2013-01-31 DIAGNOSIS — G4700 Insomnia, unspecified: Secondary | ICD-10-CM | POA: Diagnosis present

## 2013-01-31 DIAGNOSIS — I714 Abdominal aortic aneurysm, without rupture, unspecified: Secondary | ICD-10-CM

## 2013-01-31 DIAGNOSIS — G4733 Obstructive sleep apnea (adult) (pediatric): Secondary | ICD-10-CM

## 2013-01-31 DIAGNOSIS — D693 Immune thrombocytopenic purpura: Secondary | ICD-10-CM

## 2013-01-31 DIAGNOSIS — N189 Chronic kidney disease, unspecified: Secondary | ICD-10-CM | POA: Diagnosis present

## 2013-01-31 DIAGNOSIS — R7301 Impaired fasting glucose: Secondary | ICD-10-CM

## 2013-01-31 DIAGNOSIS — Z85118 Personal history of other malignant neoplasm of bronchus and lung: Secondary | ICD-10-CM

## 2013-01-31 DIAGNOSIS — E669 Obesity, unspecified: Secondary | ICD-10-CM | POA: Diagnosis present

## 2013-01-31 DIAGNOSIS — N183 Chronic kidney disease, stage 3 unspecified: Secondary | ICD-10-CM

## 2013-01-31 DIAGNOSIS — Z79899 Other long term (current) drug therapy: Secondary | ICD-10-CM

## 2013-01-31 DIAGNOSIS — I10 Essential (primary) hypertension: Secondary | ICD-10-CM

## 2013-01-31 DIAGNOSIS — K439 Ventral hernia without obstruction or gangrene: Secondary | ICD-10-CM

## 2013-01-31 DIAGNOSIS — G250 Essential tremor: Secondary | ICD-10-CM | POA: Diagnosis present

## 2013-01-31 DIAGNOSIS — E1149 Type 2 diabetes mellitus with other diabetic neurological complication: Secondary | ICD-10-CM

## 2013-01-31 DIAGNOSIS — Z720 Tobacco use: Secondary | ICD-10-CM

## 2013-01-31 DIAGNOSIS — I129 Hypertensive chronic kidney disease with stage 1 through stage 4 chronic kidney disease, or unspecified chronic kidney disease: Secondary | ICD-10-CM | POA: Diagnosis present

## 2013-01-31 DIAGNOSIS — J449 Chronic obstructive pulmonary disease, unspecified: Secondary | ICD-10-CM

## 2013-01-31 LAB — HEPATIC FUNCTION PANEL
AST: 21 U/L (ref 0–37)
Albumin: 3.8 g/dL (ref 3.5–5.2)
Total Protein: 7.9 g/dL (ref 6.0–8.3)

## 2013-01-31 LAB — CBC WITH DIFFERENTIAL/PLATELET
Basophils Relative: 0 % (ref 0–1)
Eosinophils Relative: 0 % (ref 0–5)
HCT: 44.5 % (ref 39.0–52.0)
Hemoglobin: 15.2 g/dL (ref 13.0–17.0)
Lymphs Abs: 1.5 10*3/uL (ref 0.7–4.0)
MCH: 31.7 pg (ref 26.0–34.0)
MCV: 92.9 fL (ref 78.0–100.0)
Monocytes Absolute: 1.2 10*3/uL — ABNORMAL HIGH (ref 0.1–1.0)
Monocytes Relative: 9 % (ref 3–12)
Neutro Abs: 10.2 10*3/uL — ABNORMAL HIGH (ref 1.7–7.7)
RBC: 4.79 MIL/uL (ref 4.22–5.81)
WBC: 12.9 10*3/uL — ABNORMAL HIGH (ref 4.0–10.5)

## 2013-01-31 LAB — GLUCOSE, CAPILLARY: Glucose-Capillary: 157 mg/dL — ABNORMAL HIGH (ref 70–99)

## 2013-01-31 LAB — BASIC METABOLIC PANEL
CO2: 23 mEq/L (ref 19–32)
Chloride: 106 mEq/L (ref 96–112)
Glucose, Bld: 172 mg/dL — ABNORMAL HIGH (ref 70–99)
Sodium: 141 mEq/L (ref 135–145)

## 2013-01-31 MED ORDER — LEVOFLOXACIN IN D5W 750 MG/150ML IV SOLN: 750.0000 mg | INTRAVENOUS | Status: DC

## 2013-01-31 MED ORDER — SIMVASTATIN 20 MG PO TABS
40.0000 mg | ORAL_TABLET | Freq: Every day | ORAL | Status: DC
  Administered 2013-02-01: 40 mg via ORAL
  Filled 2013-01-31: qty 2

## 2013-01-31 MED ORDER — INSULIN ASPART 100 UNIT/ML ~~LOC~~ SOLN
0.0000 [IU] | Freq: Three times a day (TID) | SUBCUTANEOUS | Status: DC
  Administered 2013-02-01 – 2013-02-02 (×2): 8 [IU] via SUBCUTANEOUS

## 2013-01-31 MED ORDER — FLEET ENEMA 7-19 GM/118ML RE ENEM: 1.0000 | ENEMA | Freq: Once | RECTAL | Status: AC | PRN

## 2013-01-31 MED ORDER — ACETAMINOPHEN 500 MG PO TABS: 500.0000 mg | ORAL_TABLET | Freq: Four times a day (QID) | ORAL | Status: DC | PRN

## 2013-01-31 MED ORDER — POTASSIUM CHLORIDE IN NACL 20-0.9 MEQ/L-% IV SOLN
INTRAVENOUS | Status: DC
  Administered 2013-01-31 – 2013-02-02 (×3): via INTRAVENOUS

## 2013-01-31 MED ORDER — ALPRAZOLAM 0.5 MG PO TABS: 0.5000 mg | ORAL_TABLET | Freq: Two times a day (BID) | ORAL | Status: DC | PRN

## 2013-01-31 MED ORDER — CITALOPRAM HYDROBROMIDE 20 MG PO TABS
20.0000 mg | ORAL_TABLET | Freq: Every day | ORAL | Status: DC
  Administered 2013-02-01 – 2013-02-02 (×2): 20 mg via ORAL
  Filled 2013-01-31 (×2): qty 1

## 2013-01-31 MED ORDER — IPRATROPIUM BROMIDE 0.02 % IN SOLN
0.5000 mg | RESPIRATORY_TRACT | Status: DC
  Administered 2013-01-31 – 2013-02-02 (×8): 0.5 mg via RESPIRATORY_TRACT
  Filled 2013-01-31 (×8): qty 2.5

## 2013-01-31 MED ORDER — INSULIN ASPART 100 UNIT/ML ~~LOC~~ SOLN
0.0000 [IU] | Freq: Every day | SUBCUTANEOUS | Status: DC
  Administered 2013-02-01: 5 [IU] via SUBCUTANEOUS

## 2013-01-31 MED ORDER — ASPIRIN EC 81 MG PO TBEC
81.0000 mg | DELAYED_RELEASE_TABLET | Freq: Every day | ORAL | Status: DC
  Administered 2013-02-01 – 2013-02-02 (×2): 81 mg via ORAL
  Filled 2013-01-31 (×2): qty 1

## 2013-01-31 MED ORDER — ENOXAPARIN SODIUM 40 MG/0.4ML ~~LOC~~ SOLN
40.0000 mg | SUBCUTANEOUS | Status: DC
  Administered 2013-01-31 – 2013-02-01 (×2): 40 mg via SUBCUTANEOUS
  Filled 2013-01-31 (×2): qty 0.4

## 2013-01-31 MED ORDER — NIACIN ER 500 MG PO CPCR
1000.0000 mg | ORAL_CAPSULE | Freq: Every day | ORAL | Status: DC
  Administered 2013-01-31 – 2013-02-01 (×2): 1000 mg via ORAL
  Filled 2013-01-31 (×2): qty 2

## 2013-01-31 MED ORDER — PANTOPRAZOLE SODIUM 40 MG PO TBEC
40.0000 mg | DELAYED_RELEASE_TABLET | Freq: Two times a day (BID) | ORAL | Status: DC
  Administered 2013-01-31 – 2013-02-02 (×4): 40 mg via ORAL
  Filled 2013-01-31 (×4): qty 1

## 2013-01-31 MED ORDER — ONDANSETRON HCL 4 MG/2ML IJ SOLN: 4.0000 mg | INTRAMUSCULAR | Status: DC | PRN

## 2013-01-31 MED ORDER — METHYLPREDNISOLONE SODIUM SUCC 125 MG IJ SOLR
125.0000 mg | Freq: Four times a day (QID) | INTRAMUSCULAR | Status: DC
  Administered 2013-01-31 – 2013-02-01 (×3): 125 mg via INTRAVENOUS
  Filled 2013-01-31 (×3): qty 2

## 2013-01-31 MED ORDER — GUAIFENESIN ER 600 MG PO TB12
1200.0000 mg | ORAL_TABLET | Freq: Two times a day (BID) | ORAL | Status: DC
  Administered 2013-01-31 – 2013-02-02 (×4): 1200 mg via ORAL
  Filled 2013-01-31 (×4): qty 2

## 2013-01-31 MED ORDER — LISINOPRIL 10 MG PO TABS
10.0000 mg | ORAL_TABLET | Freq: Every day | ORAL | Status: DC
  Administered 2013-02-01: 10 mg via ORAL
  Filled 2013-01-31: qty 1

## 2013-01-31 MED ORDER — ALBUTEROL SULFATE (5 MG/ML) 0.5% IN NEBU
2.5000 mg | INHALATION_SOLUTION | RESPIRATORY_TRACT | Status: DC
  Administered 2013-01-31 – 2013-02-02 (×8): 2.5 mg via RESPIRATORY_TRACT
  Filled 2013-01-31 (×8): qty 0.5

## 2013-01-31 MED ORDER — NIACIN ER 250 MG PO CPCR
ORAL_CAPSULE | ORAL | Status: AC
  Filled 2013-01-31: qty 4

## 2013-01-31 MED ORDER — ALBUTEROL SULFATE (5 MG/ML) 0.5% IN NEBU
5.0000 mg | INHALATION_SOLUTION | Freq: Once | RESPIRATORY_TRACT | Status: AC
  Administered 2013-01-31: 5 mg via RESPIRATORY_TRACT
  Filled 2013-01-31: qty 1

## 2013-01-31 MED ORDER — IPRATROPIUM BROMIDE 0.02 % IN SOLN
0.5000 mg | Freq: Once | RESPIRATORY_TRACT | Status: AC
  Administered 2013-01-31: 0.5 mg via RESPIRATORY_TRACT
  Filled 2013-01-31: qty 2.5

## 2013-01-31 MED ORDER — ALBUTEROL SULFATE (5 MG/ML) 0.5% IN NEBU: 2.5000 mg | INHALATION_SOLUTION | RESPIRATORY_TRACT | Status: DC | PRN

## 2013-01-31 MED ORDER — LEVOFLOXACIN IN D5W 750 MG/150ML IV SOLN
750.0000 mg | INTRAVENOUS | Status: DC
  Filled 2013-01-31 (×2): qty 150

## 2013-01-31 MED ORDER — SODIUM CHLORIDE 0.9 % IJ SOLN: 3.0000 mL | Freq: Two times a day (BID) | INTRAMUSCULAR | Status: DC

## 2013-01-31 MED ORDER — POLYETHYLENE GLYCOL 3350 17 G PO PACK: 17.0000 g | PACK | Freq: Every day | ORAL | Status: DC | PRN

## 2013-01-31 NOTE — H&P (Signed)
[facility] History and Physical  Kyle [name]  [id number]  DOB: [birth year]   DOA: 01/31/2013   PCP:   [name], MD   Chief Complaint:  Progressive shortness of breath for 4 days  HPI: Kyle [name] is a 79 y.o. male.   Obese elderly Caucasian gentleman with a history of COPD, presents with progressive shortness of breath intermittent fevers and chills and a cough productive of yellow sputum the past 4 days  Eventually came to the emergency room and received nebulizer, with some improvement, but O2 saturation remains around 89, and hospitalist service was called for admission.  Patient has become somewhat unstable in the past few weeks; he fell 8 weeks ago and fractured his right wrist; has been getting treatments and it is now in a soft cast. He is also from again a couple of time in the past few days because of weakness  Has obstructive sleep apnea and uses CPAP with supplemental oxygen at bedtime.  He does not have a diagnosis of Parkinson's disease but has noted a continuous tremor of his hands   Rewiew of Systems:   All systems negative except as marked bold or noted in the HPI;  Constitutional:    malaise, fever and chills. ;  Eyes:   eye pain, redness and discharge. ;  ENMT:   ear pain, hoarseness, nasal congestion, sinus pressure and sore throat. ;  Cardiovascular:    chest pain, palpitations, diaphoresis, dyspnea and peripheral edema.  Respiratory:   cough, hemoptysis, wheezing and stridor. ;  Gastrointestinal:  nausea, vomiting, diarrhea, constipation, abdominal pain, melena, blood in stool, hematemesis, jaundice and rectal bleeding. unusual weight loss..   Genitourinary:    frequency, dysuria, incontinence,flank pain and hematuria; Musculoskeletal:   back pain and neck pain.  swelling and trauma.;  Skin: .  pruritus, rash, abrasions, bruising and skin lesion.; ulcerations Neuro:    headache, lightheadedness and neck stiffness.  weakness, altered level of  consciousness, altered mental status, extremity weakness, burning feet, resting tremor of hands, seizure and syncope.  Psych:    anxiety, depression, insomnia, tearfulness, panic attacks, hallucinations, paranoia, suicidal or homicidal ideation    Past Medical History  Diagnosis Date  . Arteriosclerotic cardiovascular disease (ASCVD) 1973, 12/2010    S/P NSTEMI secondary to distal RCA/PL lesion, tx medically.  EF of  55%-60% per  echo.  . Diabetes mellitus     Type II  . Thrombocytopenia   . COPD (chronic obstructive pulmonary disease)   . AAA (abdominal aortic aneurysm) 2004    s/p repair 2004; 4.3 cm infrarenal in 05/2011  . Tobacco abuse     50-pack-year consumption; quit in 12/2010  . Hyperlipidemia   . OSA (obstructive sleep apnea)   . Hypertension   . Benign prostatic hypertrophy     s/p transurethral resection of the prostate  . Obesity   . Tubular adenoma of colon 02/20/10    Colon polyps 2003, 2006 as well  . Cataract   . Ventral hernia   . Diverticulosis   . Bilateral renal masses     Cystic, more prominent on CT in 12/2010 than 2007; followed by Dr. Rosana Hoes  . ITP (idiopathic thrombocytopenic purpura) 09/07/2012    Chronic ITP of adults versus medication-induced ITP.  Stable  . Insomnia   . Bladder cancer 1996    Transurethral resection of the bladder + chemotherapy/BCG as premed  . Adenocarcinoma, lung 01/2011    transthoracic FNA; resection of the superior segment of  the RLL in 03/2011; negative nodes; no chemotherapy nor radiation planned  . Chronic kidney disease     Creatinine 1.4 on discharge 12/20/2100; proteinuria; normal renal ultrasound in 2010; recent creatinines of 1.7-2.; Bilateral cystic renal masses by CT in 2011  . Nephrolithiasis 2012    ARF in 01/2011 due to obstructing nephrolithiasis    Past Surgical History  Procedure Laterality Date  . Transurethral resection of prostate    . Cystectomy    . Tonsillectomy    . Colonoscopy  03/19/2010    Dr. Gala Romney  -(poor prep) Anal papilla, rectal hyperplastic polyp, tubular adenoma removed splenic flexure, left-sided diverticula  . Wedge resection  04/2011    carcinoma of lung  . Colonoscopy  11/28/2004    RMR:  Diminutive rectal and left colon polyps as described above, cold  biopsied/removed/  Left sided diverticula. The remainder of the colonic mucosa appeared normal.  . Colonoscopy   09/15/01    RMR: Multiple diminutive polyps destroyed with dermolysis as described above/ Multiple small polyps on stalks in the colon resected with snare cautery/ Scattered pan colonic diverticulum/ The remainder of the colonic mucosa appeared normal  . Abdominal aortic aneurysm repair  2004  . Cardiac catheterization    . Lung lobectomy      Medications:  HOME MEDS: Prior to Admission medications   Medication Sig Start Date End Date Taking? Authorizing Provider  acetaminophen (TYLENOL) 500 MG tablet Take 500 mg by mouth every 6 (six) hours as needed.     Yes Historical Provider, MD  ALPRAZolam (XANAX) 0.5 MG tablet 0.5 mg. Take 0.5 mg by mouth 2 (two) times daily as needed.   Yes Historical Provider, MD  aspirin EC 81 MG tablet 81 mg. Take 81 mg by mouth daily.   Yes Historical Provider, MD  bifidobacterium infantis (ALIGN) capsule Take 1 capsule by mouth daily.   Yes Historical Provider, MD  cetirizine (ZYRTEC) 10 MG tablet Take 10 mg by mouth daily.    Yes Historical Provider, MD  ciprofloxacin (CIPRO) 500 MG tablet 500 mg. Take 1 tablet (500 mg total) by mouth 2 times daily for 10 days 11/23/12  Yes Historical Provider, MD  citalopram (CELEXA) 20 MG tablet Take 20 mg by mouth daily. Take 20 mg by mouth daily.   Yes Historical Provider, MD  ergocalciferol (VITAMIN D2) 50000 UNITS capsule Take 50,000 Units by mouth every 30 (thirty) days.    Yes Historical Provider, MD  ferrous sulfate (FERROUSUL) 325 (65 FE) MG tablet Take 325 mg by mouth daily with breakfast.     Yes Historical Provider, MD  fluticasone (FLONASE)  50 MCG/ACT nasal spray 2 sprays. 2 sprays by Nasal route daily as needed.   Yes Historical Provider, MD  HYDROcodone-acetaminophen (NORCO/VICODIN) 5-325 MG per tablet Take 1 tablet by mouth every 4 (four) hours as needed for pain. 12/14/12  Yes Carole Civil, MD  lisinopril (PRINIVIL,ZESTRIL) 10 MG tablet TAKE 1 TABLET EVERY DAY 07/19/12  Yes Yehuda Savannah, MD  metoprolol (LOPRESSOR) 50 MG tablet 50 mg. Take 50 mg by mouth 2 times daily.   Yes Historical Provider, MD  niacin 250 MG CR capsule 1,000 mg. Take 1,000 mg by mouth nightly.   Yes Historical Provider, MD  pravastatin (PRAVACHOL) 80 MG tablet Take 80 mg by mouth daily.  11/27/10  Yes Historical Provider, MD  tiotropium (SPIRIVA) 18 MCG inhalation capsule Place 1 capsule (18 mcg total) into inhaler and inhale daily. 11/22/12  Yes Grace Bushy  Wolfgang Phoenix, MD  albuterol (PROAIR HFA) 108 (90 BASE) MCG/ACT inhaler Inhale 2 puffs into the lungs every 6 (six) hours as needed for wheezing. 03/12/11 09/18/12  Kathee Delton, MD  Albuterol POWD 1 Inhaler. Inhale 1 Inhaler into the lungs 4 (four) times daily as needed.    Historical Provider, MD  nitroGLYCERIN (NITROSTAT) 0.4 MG SL tablet 0.4 mg as needed. Place 0.4 mg under the tongue every 5 (five) minutes as needed. Call MD if need more than 2    Historical Provider, MD     Allergies:  Allergies  Allergen Reactions  . Codeine Anaphylaxis  . Etodolac     dizziness    Social History:   reports that he quit smoking about 2 years ago. His smoking use included Cigarettes. He has a 50 pack-year smoking history. He has never used smokeless tobacco. He reports that he does not drink alcohol or use illicit drugs.  Family History: Family History  Problem Relation Age of Onset  . Aortic aneurysm Mother   . Emphysema Father     smoker  . Clotting disorder Father   . Arthritis Father   . Hypertension Father   . Diabetes Father   . Aortic aneurysm Father   . Stroke Paternal Grandmother   . Other  Paternal Grandfather     brain aneurysm     Physical Exam: Filed Vitals:   01/31/13 1825 01/31/13 1900 01/31/13 1950 01/31/13 2000  BP:  127/80  114/57  Pulse:  126 128 126  Temp:      TempSrc:      Resp:   26 19  Height:      Weight:      SpO2: 92% 91% 93% 93%   Blood pressure 114/57, pulse 126, temperature 98.5 F (36.9 C), temperature source Oral, resp. rate 19, height 6' (1.829 m), weight 122.471 kg (270 lb), SpO2 93.00%. Body mass index is 36.61 kg/(m^2).   GEN:  Pleasant obese Caucasian gentleman reclining bed; tachypneic; tries to cooperative with exam PSYCH:  alert and oriented x4;  anxious nor depressed; affect is appropriate. HEENT: Mucous membranes pink and anicteric; PERRLA; EOM intact; thick neck Breasts:: Not examined CHEST WALL: No tenderness CHEST: Tachypneic, bilateral rhonchi HEART: Tachycardic regular rhythm; no murmurs rubs or gallops BACK: no CVA tenderness ABDOMEN: Obese, soft non-tender; no masses, ventral hernia, normal abdominal bowel sounds; moderate pannus; no intertriginous candida. Rectal Exam: Not done EXTREMITIES: age-appropriate arthropathy of the hands and knees; a right wrist in soft cast; trace edema; no ulcerations. Genitalia: not examined PULSES: 2+ and symmetric SKIN: Normal hydration no rash or ulceration CNS: Cranial nerves 2-12 grossly intact no focal lateralizing neurologic deficit; bilateral resting pill-rolling tremor   Labs on Admission:  Basic Metabolic Panel:  Recent Labs Lab 01/31/13 1447  NA 141  K 4.2  CL 106  CO2 23  GLUCOSE 172*  BUN 40*  CREATININE 2.33*  CALCIUM 9.7   Liver Function Tests: No results found for this basename: AST, ALT, ALKPHOS, BILITOT, PROT, ALBUMIN,  in the last 168 hours No results found for this basename: LIPASE, AMYLASE,  in the last 168 hours No results found for this basename: AMMONIA,  in the last 168 hours CBC:  Recent Labs Lab 01/31/13 1447  WBC 12.9*  NEUTROABS 10.2*  HGB  15.2  HCT 44.5  MCV 92.9  PLT 114*   Cardiac Enzymes: No results found for this basename: CKTOTAL, CKMB, CKMBINDEX, TROPONINI,  in the last 168 hours BNP: No  components found with this basename: POCBNP,  D-dimer: No components found with this basename: D-DIMER,  CBG: No results found for this basename: GLUCAP,  in the last 168 hours  Radiological Exams on Admission: Dg Chest 2 View  01/31/2013   *RADIOLOGY REPORT*  Clinical Data: Fever with cough for 4 days.  History of lung cancer.  CHEST - 2 VIEW  Comparison: Radiographs 07/04/2011.  CT 08/31/2012.  Findings: The heart size and mediastinal contours are stable. There is stable volume loss and infrahilar density in the right hemithorax status post partial lung resection.  The left lung is clear.  There is no pleural effusion or pneumothorax.  The osseous structures appear unchanged.  IMPRESSION: Stable postsurgical changes in the right hemithorax with right infrahilar scarring.  No acute findings identified.   Original Report Authenticated By: Richardean Sale, M.D.    EKG: Independently reviewed. Sinus tachycardia   Assessment/Plan   Active Problems:   Hypertension   Obstructive sleep apnea   Type II or unspecified type diabetes mellitus with neurological manifestations, not stated as uncontrolled(250.60), controlled on diet   Fracture of right distal radius   COPD exacerbation Probable Parkinson's disease, likely the cause of his recurrent falls, in the needs neurology evaluation in or outpatient    PLAN: Admit this gentleman for nebulization, steroids, antibiotics, pulmonary toilet CPAP at bedtime according to home settings Sliding scale insulin since we expect deterioration of his diabetes on steroids  His increasing instability is likely related to his development of Parkinson's, and he can be followed by neurology as an in or outpatient   Other plans as per orders.  Code Status: Full Family Communication:  Plans  discuss with patient and his son at bedside Disposition Plan:    Rhilyn Battle Nocturnist [facility] Pager [phone]   01/31/2013, 9:03 PM

## 2013-01-31 NOTE — ED Provider Notes (Signed)
History    This chart was scribed for Maudry Diego, MD, by Joline Maxcy, ED scribe. The patient was seen in room APA04/APA04 and the patient's care was started at [place].   CSN: [id number] Arrival date & time 01/31/13  1341  First MD Initiated Contact with Patient 01/31/13 1758     Chief Complaint  Patient presents with  . Cough  . Fever   (Consider location/radiation/quality/duration/timing/severity/associated sxs/prior Treatment) Patient is a 79 y.o. male presenting with cough. The history is provided by the patient and medical records. No language interpreter was used.  Cough Cough characteristics:  Productive Sputum characteristics:  Green and gray Severity:  Moderate Onset quality:  Gradual Duration:  4 days Timing:  Intermittent Progression:  Worsening Chronicity:  New Smoker: no   Context: not upper respiratory infection and not weather changes   Relieved by:  Nothing Worsened by:  Nothing tried Ineffective treatments: CPAP and O2. Associated symptoms: fever   Associated symptoms: no eye discharge and no rash   Fever:    Duration:  4 days   Timing:  Unable to specify   Progression:  Unable to specify  HPI Comments: Kyle [name] is a 79 y.o. male with a h/o of adenocarcinoma and a surgical h/o of a wedge resection in 2012 and abdominal aortic aneurysm repair in 2004 who presents to the Emergency Department complaining of an intermittent, deep productive cough with dark greenish gray sputum that began 4 days ago. He reports he uses O2 and CPAP at night when he is at baseline, but has used both around the clock during the last 4 days. He also complains of a fever and weakness. In ED, his temperature is 99.9. He denies SOB. His wife reports his home health nurse reported chills that developed this morning, but have since resolved. He reports he had 2 Tylenol 6.50 hours ago with relief.   Past Medical History  Diagnosis Date  . Arteriosclerotic cardiovascular disease  (ASCVD) 1973, 12/2010    S/P NSTEMI secondary to distal RCA/PL lesion, tx medically.  EF of  55%-60% per  echo.  . Diabetes mellitus     Type II  . Thrombocytopenia   . COPD (chronic obstructive pulmonary disease)   . AAA (abdominal aortic aneurysm) 2004    s/p repair 2004; 4.3 cm infrarenal in 05/2011  . Tobacco abuse     50-pack-year consumption; quit in 12/2010  . Hyperlipidemia   . OSA (obstructive sleep apnea)   . Hypertension   . Benign prostatic hypertrophy     s/p transurethral resection of the prostate  . Obesity   . Tubular adenoma of colon 02/20/10    Colon polyps 2003, 2006 as well  . Cataract   . Ventral hernia   . Diverticulosis   . Bilateral renal masses     Cystic, more prominent on CT in 12/2010 than 2007; followed by Dr. Rosana Hoes  . ITP (idiopathic thrombocytopenic purpura) 09/07/2012    Chronic ITP of adults versus medication-induced ITP.  Stable  . Insomnia   . Bladder cancer 1996    Transurethral resection of the bladder + chemotherapy/BCG as premed  . Adenocarcinoma, lung 01/2011    transthoracic FNA; resection of the superior segment of the RLL in 03/2011; negative nodes; no chemotherapy nor radiation planned  . Chronic kidney disease     Creatinine 1.4 on discharge 12/20/2100; proteinuria; normal renal ultrasound in 2010; recent creatinines of 1.7-2.; Bilateral cystic renal masses by CT in 2011  .  Nephrolithiasis 2012    ARF in 01/2011 due to obstructing nephrolithiasis   Past Surgical History  Procedure Laterality Date  . Transurethral resection of prostate    . Cystectomy    . Tonsillectomy    . Colonoscopy  03/19/2010    Dr. Gala Romney -(poor prep) Anal papilla, rectal hyperplastic polyp, tubular adenoma removed splenic flexure, left-sided diverticula  . Wedge resection  04/2011    carcinoma of lung  . Colonoscopy  11/28/2004    RMR:  Diminutive rectal and left colon polyps as described above, cold  biopsied/removed/  Left sided diverticula. The remainder of the  colonic mucosa appeared normal.  . Colonoscopy   09/15/01    RMR: Multiple diminutive polyps destroyed with dermolysis as described above/ Multiple small polyps on stalks in the colon resected with snare cautery/ Scattered pan colonic diverticulum/ The remainder of the colonic mucosa appeared normal  . Abdominal aortic aneurysm repair  2004  . Cardiac catheterization    . Lung lobectomy     Family History  Problem Relation Age of Onset  . Aortic aneurysm Mother   . Emphysema Father     smoker  . Clotting disorder Father   . Arthritis Father   . Hypertension Father   . Diabetes Father   . Aortic aneurysm Father   . Stroke Paternal Grandmother   . Other Paternal Grandfather     brain aneurysm   History  Substance Use Topics  . Smoking status: Former Smoker -- 1.00 packs/day for 50 years    Types: Cigarettes    Quit date: 12/20/2010  . Smokeless tobacco: Never Used  . Alcohol Use: No    Review of Systems  Constitutional: Positive for fever. Negative for appetite change and fatigue.  HENT: Negative for sinus pressure and ear discharge.   Eyes: Negative for discharge.  Respiratory: Positive for cough.   Gastrointestinal: Negative for nausea, vomiting, abdominal pain and diarrhea.  Genitourinary: Negative for frequency and hematuria.  Musculoskeletal: Negative for back pain.  Skin: Negative for rash.  Neurological: Positive for weakness. Negative for seizures.  Psychiatric/Behavioral: Negative for hallucinations.   Allergies  Codeine and Etodolac  Home Medications   Current Outpatient Rx  Name  Route  Sig  Dispense  Refill  . acetaminophen (TYLENOL) 500 MG tablet   Oral   Take 500 mg by mouth every 6 (six) hours as needed.           . ALPRAZolam (XANAX) 0.5 MG tablet      0.5 mg. Take 0.5 mg by mouth 2 (two) times daily as needed.         Marland Kitchen aspirin EC 81 MG tablet      81 mg. Take 81 mg by mouth daily.         . bifidobacterium infantis (ALIGN) capsule    Oral   Take 1 capsule by mouth daily.         . cetirizine (ZYRTEC) 10 MG tablet   Oral   Take 10 mg by mouth daily.          . ciprofloxacin (CIPRO) 500 MG tablet      500 mg. Take 1 tablet (500 mg total) by mouth 2 times daily for 10 days         . citalopram (CELEXA) 20 MG tablet   Oral   Take 20 mg by mouth daily. Take 20 mg by mouth daily.         . ergocalciferol (VITAMIN D2)  50000 UNITS capsule   Oral   Take 50,000 Units by mouth every 30 (thirty) days.          . ferrous sulfate (FERROUSUL) 325 (65 FE) MG tablet   Oral   Take 325 mg by mouth daily with breakfast.           . fluticasone (FLONASE) 50 MCG/ACT nasal spray      2 sprays. 2 sprays by Nasal route daily as needed.         Marland Kitchen HYDROcodone-acetaminophen (NORCO/VICODIN) 5-325 MG per tablet   Oral   Take 1 tablet by mouth every 4 (four) hours as needed for pain.   40 tablet   0   . lisinopril (PRINIVIL,ZESTRIL) 10 MG tablet      TAKE 1 TABLET EVERY DAY   90 tablet   3   . metoprolol (LOPRESSOR) 50 MG tablet      50 mg. Take 50 mg by mouth 2 times daily.         . niacin 250 MG CR capsule      1,000 mg. Take 1,000 mg by mouth nightly.         . pravastatin (PRAVACHOL) 80 MG tablet   Oral   Take 80 mg by mouth daily.          Marland Kitchen tiotropium (SPIRIVA) 18 MCG inhalation capsule   Inhalation   Place 1 capsule (18 mcg total) into inhaler and inhale daily.   90 capsule   4   . EXPIRED: albuterol (PROAIR HFA) 108 (90 BASE) MCG/ACT inhaler   Inhalation   Inhale 2 puffs into the lungs every 6 (six) hours as needed for wheezing.   1 Inhaler   1   . Albuterol POWD      1 Inhaler. Inhale 1 Inhaler into the lungs 4 (four) times daily as needed.         . nitroGLYCERIN (NITROSTAT) 0.4 MG SL tablet      0.4 mg as needed. Place 0.4 mg under the tongue every 5 (five) minutes as needed. Call MD if need more than 2          BP 127/80  Pulse 126  Temp(Src) 98.5 F (36.9 C) (Oral)   Resp 20  Ht 6' (1.829 m)  Wt 270 lb (122.471 kg)  BMI 36.61 kg/m2  SpO2 91% Physical Exam  Nursing note and vitals reviewed. Constitutional: He is oriented to person, place, and time. He appears well-developed.  HENT:  Head: Normocephalic.  Eyes: Conjunctivae and EOM are normal. No scleral icterus.  Neck: Neck supple. No thyromegaly present.  Cardiovascular: Normal rate and regular rhythm.  Exam reveals no gallop and no friction rub.   No murmur heard. Pulmonary/Chest: No stridor. He has wheezes. He has no rales. He exhibits no tenderness.  Moderate wheezing bilaterally.  Abdominal: He exhibits no distension. There is no tenderness. There is no rebound.  Musculoskeletal: Normal range of motion. He exhibits no edema.  Lymphadenopathy:    He has no cervical adenopathy.  Neurological: He is oriented to person, place, and time. Coordination normal.  Skin: No rash noted. No erythema.  Psychiatric: He has a normal mood and affect. His behavior is normal.    ED Course  Procedures (including critical care time)  DIAGNOSTIC STUDIES: Oxygen Saturation is 92% on room air, adequate by my interpretation.    COORDINATION OF CARE:  18:02- Discussed planned course of treatment with the patient, including a breathing treatment, blood  work, and chest X-ray, who is agreeable at this time.  19:21- Upon recheck, he breathing is somewhat improved. He reports he is feeling somewhat better. Discussed X-ray findings with the pt as well as getting admitted to the hospital. He is agreeable at this time.   Results for orders placed during the hospital encounter of [id number]  BASIC METABOLIC PANEL      Result Value Range   Sodium 141  135 - 145 mEq/L   Potassium 4.2  3.5 - 5.1 mEq/L   Chloride 106  96 - 112 mEq/L   CO2 23  19 - 32 mEq/L   Glucose, Bld 172 (*) 70 - 99 mg/dL   BUN 40 (*) 6 - 23 mg/dL   Creatinine, Ser 2.33 (*) 0.50 - 1.35 mg/dL   Calcium 9.7  8.4 - 10.5 mg/dL   GFR calc non Af Amer  26 (*) >90 mL/min   GFR calc Af Amer 30 (*) >90 mL/min  CBC WITH DIFFERENTIAL      Result Value Range   WBC 12.9 (*) 4.0 - 10.5 K/uL   RBC 4.79  4.22 - 5.81 MIL/uL   Hemoglobin 15.2  13.0 - 17.0 g/dL   HCT 44.5  39.0 - 52.0 %   MCV 92.9  78.0 - 100.0 fL   MCH 31.7  26.0 - 34.0 pg   MCHC 34.2  30.0 - 36.0 g/dL   RDW 15.5  11.5 - 15.5 %   Platelets 114 (*) 150 - 400 K/uL   Neutrophils Relative % 79 (*) 43 - 77 %   Lymphocytes Relative 12  12 - 46 %   Monocytes Relative 9  3 - 12 %   Eosinophils Relative 0  0 - 5 %   Basophils Relative 0  0 - 1 %   Neutro Abs 10.2 (*) 1.7 - 7.7 K/uL   Lymphs Abs 1.5  0.7 - 4.0 K/uL   Monocytes Absolute 1.2 (*) 0.1 - 1.0 K/uL   Eosinophils Absolute 0.0  0.0 - 0.7 K/uL   Basophils Absolute 0.0  0.0 - 0.1 K/uL   WBC Morphology ATYPICAL LYMPHOCYTES     Smear Review PLATELET COUNT CONFIRMED BY SMEAR     Labs Reviewed  BASIC METABOLIC PANEL - Abnormal; Notable for the following:    Glucose, Bld 172 (*)    BUN 40 (*)    Creatinine, Ser 2.33 (*)    GFR calc non Af Amer 26 (*)    GFR calc Af Amer 30 (*)    All other components within normal limits  CBC WITH DIFFERENTIAL - Abnormal; Notable for the following:    WBC 12.9 (*)    Platelets 114 (*)    Neutrophils Relative % 79 (*)    Neutro Abs 10.2 (*)    Monocytes Absolute 1.2 (*)    All other components within normal limits   Dg Chest 2 View  01/31/2013   *RADIOLOGY REPORT*  Clinical Data: Fever with cough for 4 days.  History of lung cancer.  CHEST - 2 VIEW  Comparison: Radiographs 07/04/2011.  CT 08/31/2012.  Findings: The heart size and mediastinal contours are stable. There is stable volume loss and infrahilar density in the right hemithorax status post partial lung resection.  The left lung is clear.  There is no pleural effusion or pneumothorax.  The osseous structures appear unchanged.  IMPRESSION: Stable postsurgical changes in the right hemithorax with right infrahilar scarring.  No acute  findings identified.  Original Report Authenticated By: Richardean Sale, M.D.   No diagnosis found.  Date: 01/31/2013  Rate: 127  Rhythm: sinus tachycardia  QRS Axis: left  Intervals: normal  ST/T Wave abnormalities: nonspecific ST changes  Conduction Disutrbances:none  Narrative Interpretation:   Old EKG Reviewed: unchanged   MDM    The chart was scribed for me under my direct supervision.  I personally performed the history, physical, and medical decision making and all procedures in the evaluation of this patient.Maudry Diego, MD 01/31/13 [phone]

## 2013-01-31 NOTE — ED Notes (Signed)
Pt's O2 level upper 80'2, placed pt on 2L [place] for comfort. Current sats at 93%

## 2013-01-31 NOTE — ED Notes (Signed)
Cough, fever, chills, Had tylenol 2 hours pta.  Alert, talking  No N/V,D

## 2013-02-01 LAB — GLUCOSE, CAPILLARY
Glucose-Capillary: 274 mg/dL — ABNORMAL HIGH (ref 70–99)
Glucose-Capillary: 412 mg/dL — ABNORMAL HIGH (ref 70–99)
Glucose-Capillary: 365 mg/dL — ABNORMAL HIGH (ref 70–99)

## 2013-02-01 LAB — GLUCOSE, RANDOM: Glucose, Bld: 480 mg/dL — ABNORMAL HIGH (ref 70–99)

## 2013-02-01 LAB — BASIC METABOLIC PANEL
BUN: 39 mg/dL — ABNORMAL HIGH (ref 6–23)
Chloride: 104 mEq/L (ref 96–112)
GFR calc Af Amer: 33 mL/min — ABNORMAL LOW (ref >90)
GFR calc non Af Amer: 29 mL/min — ABNORMAL LOW (ref >90)
Potassium: 4.3 mEq/L (ref 3.5–5.1)
Sodium: 138 mEq/L (ref 135–145)

## 2013-02-01 LAB — CBC
Hemoglobin: 13.1 g/dL (ref 13.0–17.0)
MCHC: 33.4 g/dL (ref 30.0–36.0)
RBC: 4.17 MIL/uL — ABNORMAL LOW (ref 4.22–5.81)
WBC: 7.2 10*3/uL (ref 4.0–10.5)

## 2013-02-01 LAB — HEMOGLOBIN A1C
Hgb A1c MFr Bld: 6.3 % — ABNORMAL HIGH (ref <5.7)
Mean Plasma Glucose: 134 mg/dL — ABNORMAL HIGH (ref <117)

## 2013-02-01 LAB — TSH: TSH: 3.506 u[IU]/mL (ref 0.350–4.500)

## 2013-02-01 MED ORDER — PREDNISONE 20 MG PO TABS
40.0000 mg | ORAL_TABLET | Freq: Every day | ORAL | Status: DC
  Administered 2013-02-01 – 2013-02-02 (×2): 40 mg via ORAL
  Filled 2013-02-01 (×2): qty 2

## 2013-02-01 MED ORDER — LEVOFLOXACIN IN D5W 750 MG/150ML IV SOLN
INTRAVENOUS | Status: AC
  Filled 2013-02-01: qty 150

## 2013-02-01 MED ORDER — INSULIN GLARGINE 100 UNIT/ML ~~LOC~~ SOLN
15.0000 [IU] | Freq: Every day | SUBCUTANEOUS | Status: DC
  Administered 2013-02-01 – 2013-02-02 (×2): 15 [IU] via SUBCUTANEOUS
  Filled 2013-02-01 (×3): qty 0.15

## 2013-02-01 MED ORDER — INSULIN ASPART 100 UNIT/ML ~~LOC~~ SOLN
20.0000 [IU] | Freq: Once | SUBCUTANEOUS | Status: AC
  Administered 2013-02-01: 20 [IU] via SUBCUTANEOUS

## 2013-02-01 MED ORDER — LEVOFLOXACIN IN D5W 750 MG/150ML IV SOLN
750.0000 mg | Freq: Once | INTRAVENOUS | Status: AC
  Administered 2013-02-01: 750 mg via INTRAVENOUS
  Filled 2013-02-01: qty 150

## 2013-02-01 NOTE — Progress Notes (Signed)
Pt set up on home CPAP with 3lpm bleed in. Pt tol well will continue to monitor through out the night.

## 2013-02-01 NOTE — Progress Notes (Signed)
Patient is currently active with [facility] for chronic disease management services.  Patient has been engaged by a [employer].  Our community based plan of care has focused on disease management of COPD.  Patient is adherent to medications regimen, medical appointments, and keeps very detailed health journal notes.  He has a very capable and supportive spouse.  However, she is somewhat anxious when he suffers acute illness.  Our community based nurse has established a strong rapport with her and will be reaching out to her daily during his hospitalization for reassurance and support.  Patient will receive a post discharge transition of care call and will be evaluated for monthly home visits for assessments and disease process education.  Made inpatient Case Manager aware that [facility] following. Of note, [facility] Care Management services does not replace or interfere with any services that are arranged by inpatient case management or social work.  For additional questions or referrals please contact Corliss Blacker BSN RN [facility] Liaison at [phone].

## 2013-02-01 NOTE — Care Management Note (Signed)
    Page 1 of 2   02/02/2013     11:40:19 AM   CARE MANAGEMENT NOTE 02/02/2013  Patient:  Kyle [name], Kyle [name]   Account Number:  [id number]  Date Initiated:  02/01/2013  Documentation initiated by:  Theophilus Kinds  Subjective/Objective Assessment:   Pt admitted from home with COPD exacerbation. Pt lives with his wife and will return home at discharge. Pt has home O2 and CPAP for hs use. Pt followed by [facility]. Pt has cane and walker for home use.     Action/Plan:   Pt is very interested in [facility] RN and PT. Pt also informed about Life Alert button and where he can obtain this service. Will continue to follow for discharge planning needs.   Anticipated DC Date:  02/03/2013   Anticipated DC Plan:  [place]  CM consult      [facility] Choice  HOME HEALTH   Choice offered to / List presented to:  C-3 Spouse        HH arranged  HH-1 RN  [place].   Status of service:  Completed, signed off Medicare Important Message given?  NA - LOS <3 / Initial given by admissions (If response is "NO", the following Medicare IM given date fields will be blank) Date Medicare IM given:   Date Additional Medicare IM given:    Discharge Disposition:  [place]  Per UR Regulation:    If discussed at Long Length of Stay Meetings, dates discussed:    Comments:  02/02/13 [place], RN BSN CM Pt discharged home today with [facility] for RN, PT, and CSW. Romualdo Bolk of [facility] is aware and will collect the pts information from the chart. No DME needs noted. Pt did not qualify for increase in O2 during the day. Hh services to start within 48 hours of discharge. Pt, pts wife, and pts nurse aware of discharge arrangements.  02/01/13 [place], RN BSN CM

## 2013-02-01 NOTE — Progress Notes (Signed)
This 79 year old man was admitted earlier today with dyspnea. He feels improved since admission. He does have a resting tremor which may be benign essential tremor. Agree with plan of antibiotics and steroids. I think he can go to oral steroids even today. We'll increase his IV fluids. Possible discharge home tomorrow.

## 2013-02-01 NOTE — Progress Notes (Signed)
UR chart review completed.  

## 2013-02-02 ENCOUNTER — Telehealth: Payer: Self-pay | Admitting: Family Medicine

## 2013-02-02 ENCOUNTER — Other Ambulatory Visit: Payer: Self-pay

## 2013-02-02 DIAGNOSIS — R251 Tremor, unspecified: Secondary | ICD-10-CM

## 2013-02-02 LAB — HEMOGLOBIN A1C: Mean Plasma Glucose: 131 mg/dL — ABNORMAL HIGH (ref <117)

## 2013-02-02 LAB — GLUCOSE, CAPILLARY: Glucose-Capillary: 253 mg/dL — ABNORMAL HIGH (ref 70–99)

## 2013-02-02 MED ORDER — PREDNISONE 20 MG PO TABS: ORAL_TABLET | ORAL | Status: DC

## 2013-02-02 MED ORDER — LEVOFLOXACIN 500 MG PO TABS: 500.0000 mg | ORAL_TABLET | Freq: Every day | ORAL | Status: DC

## 2013-02-02 NOTE — Telephone Encounter (Addendum)
Pt's son, [name], called to say that pt has appointment with Dr. Jannifer Franklin on 02/23/13 but he was told if Dr. Wolfgang Phoenix called and spoke directly with Dr. Jannifer Franklin, the pt may could be seen sooner.  Son is requesting this due to the fact that the pt's hands are shaking so badly he's having a lot of trouble eating and drinking. Please advise.  Ph# for Dr. Jannifer Franklin, [phone]

## 2013-02-02 NOTE — Telephone Encounter (Signed)
Referral initiated in the system.

## 2013-02-02 NOTE — Progress Notes (Signed)
Pt a/o.vss. O2 sats checked on room air and sats 95%. No resp distress noted. Saline lock removed. Discharge instructions and prescriptions given and discussed with family members at bedside. Family verbalized understanding of instructions. Pt left floor via wheelchair with family and nursing staff.

## 2013-02-02 NOTE — Discharge Summary (Signed)
Physician Discharge Summary  Kyle [name] [id number] DOB: [birth year] DOA: 01/31/2013  PCP: [name], MD  Admit date: 01/31/2013 Discharge date: 02/02/2013  Time spent: Greater than 30 minutes  Recommendations for Outpatient Follow-up:  1. Follow with primary care physician in the next week or so. 2. Recommend outpatient referral to neurology for resting tremor.  Discharge Diagnoses:  1. COPD exacerbation, improved. 2. Diet-controlled diabetes mellitus. 3. Hypertension. 4. Obstructive sleep apnea. 5. Resting tremor, unclear etiology, possibly benign essential tremor.   Discharge Condition: Improved and stable.  Diet recommendation: Carbohydrate modified diet.  Filed Weights   01/31/13 1350 02/01/13 0500 02/02/13 0500  Weight: 122.471 kg (270 lb) 120.2 kg (264 lb 15.9 oz) 123.9 kg (273 lb 2.4 oz)    History of present illness:  This very pleasant 79 year old man presented to the hospital with symptoms of dyspnea for 4 days. Please see initial history as outlined below: HPI:  Kyle [name] is a 79 y.o. male. Obese elderly Caucasian gentleman with a history of COPD, presents with progressive shortness of breath intermittent fevers and chills and a cough productive of yellow sputum the past 4 days  Eventually came to the emergency room and received nebulizer, with some improvement, but O2 saturation remains around 89, and hospitalist service was called for admission.  Patient has become somewhat unstable in the past few weeks; he fell 8 weeks ago and fractured his right wrist; has been getting treatments and it is now in a soft cast. He is also from again a couple of time in the past few days because of weakness  Has obstructive sleep apnea and uses CPAP with supplemental oxygen at bedtime.  He does not have a diagnosis of Parkinson's disease but has noted a continuous tremor of his hands  Hospital Course:  The patient was admitted and started on intravenous steroids and  antibiotics as well as bronchodilators. There was no evidence of pneumonia on chest x-ray. He has improved significantly and is stable for discharge now on oral steroids. His blood glucose became elevated on steroids and I suspect this will go to his baseline once the steroids have been finished. During the hospitalization, it was noted that he has a resting tremor. He did not, in my opinion, have any features of parkinsonism and I think his diagnosis is probably benign essential tremor. However, I would urge that he be referred soon to neurology in the outpatient setting for further diagnostic workup. He is stable for discharge now.  Procedures:  None.  Consultations:  None.  Discharge Exam: Filed Vitals:   02/01/13 2203 02/01/13 2319 02/02/13 0500 02/02/13 0701  BP: 169/61  131/77   Pulse: 96  79   Temp: 97.4 F (36.3 C)  97.9 F (36.6 C)   TempSrc: Axillary  Oral   Resp: 20  20   Height:      Weight:   123.9 kg (273 lb 2.4 oz)   SpO2: 93% 93% 95% 93%    General: He looks systemically well. Is not toxic or septic. He does not have increased work of breathing. There is no peripheral or central cyanosis. Cardiovascular: Heart sounds are present without murmurs or added sounds. Respiratory: Lung fields are clear without any evidence of wheezing, crackles or bronchial breathing. He is alert and oriented. He does have resting tremor as mentioned above. There are no focal neurological signs.  Discharge Instructions  Discharge Orders   Future Appointments Provider Department Dept Phone   02/28/2013 9:15 AM  Kathee Delton, MD [facility] [phone]   03/01/2013 8:10 AM Mikey Kirschner, MD [facility] [phone]   03/04/2013 10:30 AM [facility] [place] [phone]   03/07/2013 10:15 AM Ap[facility] [address] IM[facility] [phone]   Patient to arrive 15 minutes prior to appointment time.   03/18/2013 10:30 AM Baird Cancer, PA-C [facility] [phone]   Future Orders Complete By Expires     Diet - low sodium heart healthy  As directed     Increase activity slowly  As directed         Medication List    STOP taking these medications       CIPRO 500 MG tablet  Generic drug:  ciprofloxacin     lisinopril 10 MG tablet  Commonly known as:  PRINIVIL,ZESTRIL      TAKE these medications       albuterol 108 (90 BASE) MCG/ACT inhaler  Commonly known as:  PROAIR HFA  Inhale 2 puffs into the lungs every 6 (six) hours as needed for wheezing.     Albuterol Powd  1 Inhaler. Inhale 1 Inhaler into the lungs 4 (four) times daily as needed.     ALPRAZolam 0.5 MG tablet  Commonly known as:  XANAX  0.5 mg. Take 0.5 mg by mouth 2 (two) times daily as needed.     aspirin EC 81 MG tablet  81 mg. Take 81 mg by mouth daily.     bifidobacterium infantis capsule  Take 1 capsule by mouth daily.     cetirizine 10 MG tablet  Commonly known as:  ZYRTEC  Take 10 mg by mouth daily.     citalopram 20 MG tablet  Commonly known as:  CELEXA  Take 20 mg by mouth daily. Take 20 mg by mouth daily.     ergocalciferol 50000 UNITS capsule  Commonly known as:  VITAMIN D2  Take 50,000 Units by mouth every 30 (thirty) days.     FERROUSUL 325 (65 FE) MG tablet  Generic drug:  ferrous sulfate  Take 325 mg by mouth daily with breakfast.     fluticasone 50 MCG/ACT nasal spray  Commonly known as:  FLONASE  2 sprays. 2 sprays by Nasal route daily as needed.     HYDROcodone-acetaminophen 5-325 MG per tablet  Commonly known as:  NORCO/VICODIN  Take 1 tablet by mouth every 4 (four) hours as needed for pain.     levofloxacin 500 MG tablet  Commonly known as:  LEVAQUIN  Take 1 tablet (500 mg total) by mouth daily.     metoprolol 50 MG tablet  Commonly known as:  LOPRESSOR  50 mg. Take 50 mg by mouth 2 times daily.     niacin 250 MG CR capsule  1,000 mg. Take 1,000 mg by mouth nightly.     nitroGLYCERIN 0.4 MG SL  tablet  Commonly known as:  NITROSTAT  0.4 mg as needed. Place 0.4 mg under the tongue every 5 (five) minutes as needed. Call MD if need more than 2     pravastatin 80 MG tablet  Commonly known as:  PRAVACHOL  Take 80 mg by mouth daily.     predniSONE 20 MG tablet  Commonly known as:  DELTASONE  Take 2 tablets daily for 2 days, then 1 tablet daily for 2 days, then half tablet daily for 2 days, then STOP.     tiotropium 18 MCG inhalation capsule  Commonly  known as:  SPIRIVA  Place 1 capsule (18 mcg total) into inhaler and inhale daily.     TYLENOL 500 MG tablet  Generic drug:  acetaminophen  Take 500 mg by mouth every 6 (six) hours as needed.       Allergies  Allergen Reactions  . Codeine Anaphylaxis  . Etodolac     dizziness      The results of significant diagnostics from this hospitalization (including imaging, microbiology, ancillary and laboratory) are listed below for reference.    Significant Diagnostic Studies: Dg Chest 2 View  01/31/2013   *RADIOLOGY REPORT*  Clinical Data: Fever with cough for 4 days.  History of lung cancer.  CHEST - 2 VIEW  Comparison: Radiographs 07/04/2011.  CT 08/31/2012.  Findings: The heart size and mediastinal contours are stable. There is stable volume loss and infrahilar density in the right hemithorax status post partial lung resection.  The left lung is clear.  There is no pleural effusion or pneumothorax.  The osseous structures appear unchanged.  IMPRESSION: Stable postsurgical changes in the right hemithorax with right infrahilar scarring.  No acute findings identified.   Original Report Authenticated By: Richardean Sale, M.D.   Dg Wrist Complete Right  01/25/2013   Radiology report  3 views right wrist  Right distal radius fracture. Previous film show right distal radius  fracture progression towards healing   Today's film shows same with minimal displacement   Impression healing right distal radius fracture      Labs: Basic  Metabolic Panel:  Recent Labs Lab 01/31/13 1447 02/01/13 0545 02/01/13 1205  NA 141 138  --   K 4.2 4.3  --   CL 106 104  --   CO2 23 24  --   GLUCOSE 172* 291* 480*  BUN 40* 39*  --   CREATININE 2.33* 2.11*  --   CALCIUM 9.7 9.3  --    Liver Function Tests:  Recent Labs Lab 01/31/13 1447  AST 21  ALT 17  ALKPHOS 67  BILITOT 1.2  PROT 7.9  ALBUMIN 3.8     CBC:  Recent Labs Lab 01/31/13 1447 02/01/13 0545  WBC 12.9* 7.2  NEUTROABS 10.2*  --   HGB 15.2 13.1  HCT 44.5 39.2  MCV 92.9 94.0  PLT 114* 104*    CBG:  Recent Labs Lab 02/01/13 0714 02/01/13 1129 02/01/13 1625 02/01/13 2138 02/02/13 0745  GLUCAP 274* 412* 407* 365* 253*       Signed:  Van Horn C  [facility] 02/02/2013, 9:53 AM

## 2013-02-02 NOTE — Telephone Encounter (Signed)
We're stuck here. I haven't even seen pt's tremor. Call dr Jannifer Franklin tom morn for me to spk

## 2013-02-02 NOTE — Telephone Encounter (Signed)
Patient being discharged from hospital, pt's son called stating that the hospitalist recommended pt see neurologist for his hands shaking so that he's having trouble eating.  Family is requesting Dr. Jannifer Franklin at [facility].  If "ok" please initiate referral in system.  I can send through Epic to their office as they are on the same system.

## 2013-02-02 NOTE — Telephone Encounter (Signed)
Ok plz initiate

## 2013-02-03 NOTE — Telephone Encounter (Signed)
Called Dr. Jannifer Franklin office. Dr. Jannifer Franklin is out of office today and tomorrow. We will try to call back Monday per Dr. Richardson Landry. Pt's son notified and also pt needs follow up next week with Dr. Richardson Landry. appt made.

## 2013-02-03 NOTE — Telephone Encounter (Signed)
Dr. Jannifer Franklin phone # [phone]

## 2013-02-08 ENCOUNTER — Encounter: Payer: Self-pay | Admitting: Family Medicine

## 2013-02-08 ENCOUNTER — Ambulatory Visit (INDEPENDENT_AMBULATORY_CARE_PROVIDER_SITE_OTHER): Payer: Medicare Other | Admitting: Family Medicine

## 2013-02-08 VITALS — BP 128/70 | Temp 97.6°F | Wt 268.0 lb

## 2013-02-08 DIAGNOSIS — R350 Frequency of micturition: Secondary | ICD-10-CM

## 2013-02-08 LAB — POCT URINALYSIS DIPSTICK: pH, UA: 5

## 2013-02-08 MED ORDER — GLYBURIDE 2.5 MG PO TABS: 2.5000 mg | ORAL_TABLET | Freq: Every day | ORAL | Status: DC

## 2013-02-08 NOTE — Progress Notes (Signed)
  Subjective:    Patient ID: Kyle [name], male    DOB: [birth year], 79 y.o.   MRN: [id number]  HPI Cough and congestion has improved  abf pain better, less loose stools. Last dose on abx was just this morning. Had some low abdominal discomfort but he describes it as muscle pain worse with motion.  Sugars up in the hospital. Number soft in 3 or 400.  Patient notes frequent urination at nighttime. Much more than his baseline. Just coming off his steroids.  Patient reports significant weakness.  Patient reports his breathing overall is better.     Review of Systems No chest pain no back pain no vomiting appetite decent.    Objective:   Physical Exam  Alert no acute distress. Somewhat fatigued. HEENT normal. Lungs no wheezes no crackles no tachypnea heart regular rate and rhythm. Abdomen good bowel sounds. No discrete tenderness. Mild low abdominal discomfort. Hands mild essential tremor noted. No cerebellar dysfunction on exam.     Assessment & Plan:  Impression status post hospitalization. #1 COPD with recent pulmonary infection improving. #2 type 2 diabetes now with high numbers and significant polyuria discussed #3 loose bowels with antibiotics improving off antibiotics warning signs discussed. #4 tremor likely essential not I. doubt Parkinson's. Of note neurology consults in of note I spoke with neurologist at family request and could not get an earlier appointment. All hospital records reviewed at length. 40 minutes spent with the patient in discussion and assessment of his complicated course. Initiate glyburide 2.5 daily. Check sugars each morning. Followup as scheduled. Notify us when sugars return to near-normal. WSL

## 2013-02-08 NOTE — Telephone Encounter (Signed)
Pt in today for an office visit. Dr. Richardson Landry spoke with Dr. Jannifer Franklin yesterday.

## 2013-02-08 NOTE — Patient Instructions (Signed)
Take the new low dose diabetes tablet each morning  Check the morning sugar each morn   Call us in one week with numbers  If morning numbers drop below eighty, go ahead and stop medicine and notify us

## 2013-02-11 ENCOUNTER — Other Ambulatory Visit: Payer: Self-pay

## 2013-02-11 MED ORDER — METRONIDAZOLE 250 MG PO TABS: 250.0000 mg | ORAL_TABLET | Freq: Four times a day (QID) | ORAL | Status: AC

## 2013-02-14 DIAGNOSIS — E1149 Type 2 diabetes mellitus with other diabetic neurological complication: Secondary | ICD-10-CM

## 2013-02-14 DIAGNOSIS — J441 Chronic obstructive pulmonary disease with (acute) exacerbation: Secondary | ICD-10-CM

## 2013-02-14 DIAGNOSIS — E1142 Type 2 diabetes mellitus with diabetic polyneuropathy: Secondary | ICD-10-CM

## 2013-02-17 ENCOUNTER — Ambulatory Visit: Payer: Medicare Other | Admitting: Pulmonary Disease

## 2013-02-18 ENCOUNTER — Ambulatory Visit: Payer: Medicare Other | Admitting: Pulmonary Disease

## 2013-02-21 ENCOUNTER — Telehealth: Payer: Self-pay | Admitting: Family Medicine

## 2013-02-21 ENCOUNTER — Other Ambulatory Visit: Payer: Self-pay | Admitting: *Deleted

## 2013-02-21 MED ORDER — GLYBURIDE 2.5 MG PO TABS: 2.5000 mg | ORAL_TABLET | Freq: Every day | ORAL | Status: DC

## 2013-02-21 NOTE — Telephone Encounter (Signed)
Continue med as directed. Refills sent electronically to [facility].

## 2013-02-21 NOTE — Telephone Encounter (Signed)
Family and Home Health nurse notified.

## 2013-02-21 NOTE — Telephone Encounter (Signed)
Wanting to know if patient is to continue glyburide .Marland Kitchen He doesn't have any refills

## 2013-02-21 NOTE — Telephone Encounter (Signed)
Patient may take this medication 2.5 mg every morning. Please call in 30 day with 2 refills. Followup office visit in approximately 3 months for hemoglobin A1c and visit with Dr. Richardson Landry

## 2013-02-21 NOTE — Telephone Encounter (Signed)
Patient was started glyburide 2.5 mg one a day- was given 14 tabs and to check sugar numbers. Nurse states sugars are running good  114-120 -highest reading 153- needs refill  of med

## 2013-02-23 ENCOUNTER — Encounter: Payer: Self-pay | Admitting: Neurology

## 2013-02-23 ENCOUNTER — Telehealth: Payer: Self-pay | Admitting: Family Medicine

## 2013-02-23 ENCOUNTER — Ambulatory Visit (INDEPENDENT_AMBULATORY_CARE_PROVIDER_SITE_OTHER): Payer: Medicare Other | Admitting: Neurology

## 2013-02-23 VITALS — BP 116/72 | HR 70 | Ht 72.25 in | Wt 265.0 lb

## 2013-02-23 DIAGNOSIS — R269 Unspecified abnormalities of gait and mobility: Secondary | ICD-10-CM | POA: Insufficient documentation

## 2013-02-23 DIAGNOSIS — R251 Tremor, unspecified: Secondary | ICD-10-CM | POA: Insufficient documentation

## 2013-02-23 DIAGNOSIS — E1142 Type 2 diabetes mellitus with diabetic polyneuropathy: Secondary | ICD-10-CM

## 2013-02-23 DIAGNOSIS — H532 Diplopia: Secondary | ICD-10-CM

## 2013-02-23 DIAGNOSIS — G252 Other specified forms of tremor: Secondary | ICD-10-CM

## 2013-02-23 DIAGNOSIS — G250 Essential tremor: Secondary | ICD-10-CM

## 2013-02-23 HISTORY — DX: Unspecified abnormalities of gait and mobility: R26.9

## 2013-02-23 HISTORY — DX: Type 2 diabetes mellitus with diabetic polyneuropathy: E11.42

## 2013-02-23 HISTORY — DX: Essential tremor: G25.2

## 2013-02-23 HISTORY — DX: Diplopia: H53.2

## 2013-02-23 HISTORY — DX: Essential tremor: G25.0

## 2013-02-23 NOTE — Telephone Encounter (Signed)
Patient needs new prescription for alprazolam 5mg  called in.also needs new sugar machine and test strips his machine is outdated . [facility].

## 2013-02-23 NOTE — Progress Notes (Signed)
Reason for visit: Tremor  Kyle [name] is a 79 y.o. male  History of present illness:  Kyle [name] is a 79 year old right-handed white male with a history of obesity, diabetes, hypertension, COPD, and tremor. The patient has a history of tremor that dates back at least 2 years. The patient indicates that the tremors are affecting both upper extremities, and the tremor is present when using the arms, not at rest. The patient indicates that there may be variability from day-to-day with the tremor, and the tremor tends to be worse when he is tired or sick. The patient has troubles with feeding himself, and difficulty with handwriting. The patient denies any tremor affecting the head or neck or any vocal tremor. The patient indicates that he was recently in the hospital with a pulmonary infection, and the tremor significantly worsened at that time. The patient is on metoprolol, and he takes alprazolam at times for sleep. The patient indicates that his father may have had a tremor as he got older. The patient indicates that he has a chronic gait disorder, and he is now using a walker for ambulation. The patient has fallen on occasion. The patient reports some numbness in the feet, with occasional burning and stinging in his feet at night. The patient will have occasional bowel incontinence, and some urinary frequency. The patient is sent to this office for an evaluation. The patient also goes on to say that he has had double vision in a horizontal plane for 4 or 5 years, and this issue has not been evaluated. The double vision does impair his ability to drive to some degree, and he cannot read a newspaper well. The patient denies headaches, or changes in speech. The patient does have some slight swallowing issues.  Past Medical History  Diagnosis Date  . Arteriosclerotic cardiovascular disease (ASCVD) 1973, 12/2010    S/P NSTEMI secondary to distal RCA/PL lesion, tx medically.  EF of  55%-60% per  echo.  .  Diabetes mellitus     Type II  . Thrombocytopenia   . COPD (chronic obstructive pulmonary disease)   . AAA (abdominal aortic aneurysm) 2004    s/p repair 2004; 4.3 cm infrarenal in 05/2011  . Tobacco abuse     50-pack-year consumption; quit in 12/2010  . Hyperlipidemia   . OSA (obstructive sleep apnea)   . Hypertension   . Benign prostatic hypertrophy     s/p transurethral resection of the prostate  . Obesity   . Tubular adenoma of colon 02/20/10    Colon polyps 2003, 2006 as well  . Cataract   . Ventral hernia   . Diverticulosis   . Bilateral renal masses     Cystic, more prominent on CT in 12/2010 than 2007; followed by Dr. Rosana Hoes  . ITP (idiopathic thrombocytopenic purpura) 09/07/2012    Chronic ITP of adults versus medication-induced ITP.  Stable  . Insomnia   . Bladder cancer 1996    Transurethral resection of the bladder + chemotherapy/BCG as premed  . Adenocarcinoma, lung 01/2011    transthoracic FNA; resection of the superior segment of the RLL in 03/2011; negative nodes; no chemotherapy nor radiation planned  . Chronic kidney disease     Creatinine 1.4 on discharge 12/20/2100; proteinuria; normal renal ultrasound in 2010; recent creatinines of 1.7-2.; Bilateral cystic renal masses by CT in 2011  . Nephrolithiasis 2012    ARF in 01/2011 due to obstructing nephrolithiasis  . Arm fracture  right arm  . Essential and other specified forms of tremor 02/23/2013  . Diplopia 02/23/2013  . Polyneuropathy in diabetes(357.2) 02/23/2013  . Abnormality of gait 02/23/2013    Past Surgical History  Procedure Laterality Date  . Transurethral resection of prostate    . Cystectomy    . Tonsillectomy    . Colonoscopy  03/19/2010    Dr. Gala Romney -(poor prep) Anal papilla, rectal hyperplastic polyp, tubular adenoma removed splenic flexure, left-sided diverticula  . Wedge resection  04/2011    carcinoma of lung  . Colonoscopy  11/28/2004    RMR:  Diminutive rectal and left colon polyps as described  above, cold  biopsied/removed/  Left sided diverticula. The remainder of the colonic mucosa appeared normal.  . Colonoscopy   09/15/01    RMR: Multiple diminutive polyps destroyed with dermolysis as described above/ Multiple small polyps on stalks in the colon resected with snare cautery/ Scattered pan colonic diverticulum/ The remainder of the colonic mucosa appeared normal  . Abdominal aortic aneurysm repair  2004  . Cardiac catheterization    . Lung lobectomy      Family History  Problem Relation Age of Onset  . Aortic aneurysm Mother   . Emphysema Father     smoker  . Clotting disorder Father   . Arthritis Father   . Hypertension Father   . Diabetes Father   . Aortic aneurysm Father   . Tremor Father   . Stroke Paternal Grandmother   . Other Paternal Grandfather     brain aneurysm    Social history:  reports that he quit smoking about 2 months ago. His smoking use included Cigarettes. He has a 50 pack-year smoking history. He has never used smokeless tobacco. He reports that he does not drink alcohol or use illicit drugs.  Medications:  Current Outpatient Prescriptions on File Prior to Visit  Medication Sig Dispense Refill  . acetaminophen (TYLENOL) 500 MG tablet Take 500 mg by mouth every 6 (six) hours as needed.        . ALPRAZolam (XANAX) 0.5 MG tablet 0.5 mg. Take 0.5 mg by mouth 2 (two) times daily as needed.      Marland Kitchen aspirin EC 81 MG tablet 81 mg. Take 81 mg by mouth daily.      . bifidobacterium infantis (ALIGN) capsule Take 1 capsule by mouth daily.      . cetirizine (ZYRTEC) 10 MG tablet Take 10 mg by mouth daily.       . citalopram (CELEXA) 20 MG tablet Take 20 mg by mouth daily. Take 20 mg by mouth daily.      . ergocalciferol (VITAMIN D2) 50000 UNITS capsule Take 50,000 Units by mouth every 30 (thirty) days.       . ferrous sulfate (FERROUSUL) 325 (65 FE) MG tablet Take 325 mg by mouth daily with breakfast.        . fluticasone (FLONASE) 50 MCG/ACT nasal spray 2  sprays. 2 sprays by Nasal route daily as needed.      . glyBURIDE (DIABETA) 2.5 MG tablet Take 1 tablet (2.5 mg total) by mouth daily with breakfast.  30 tablet  2  . metoprolol (LOPRESSOR) 50 MG tablet 50 mg. Take 50 mg by mouth 2 times daily.      . niacin 500 MG tablet Take 500 mg by mouth. Take 2 at night      . nitroGLYCERIN (NITROSTAT) 0.4 MG SL tablet 0.4 mg as needed. Place 0.4 mg under the tongue  every 5 (five) minutes as needed. Call MD if need more than 2      . pravastatin (PRAVACHOL) 80 MG tablet Take 80 mg by mouth daily.       Marland Kitchen tiotropium (SPIRIVA) 18 MCG inhalation capsule Place 1 capsule (18 mcg total) into inhaler and inhale daily.  90 capsule  4  . albuterol (PROAIR HFA) 108 (90 BASE) MCG/ACT inhaler Inhale 2 puffs into the lungs every 6 (six) hours as needed for wheezing.  1 Inhaler  1  . lisinopril (PRINIVIL,ZESTRIL) 10 MG tablet       . [DISCONTINUED] budesonide-formoterol (SYMBICORT) 160-4.5 MCG/ACT inhaler Inhale 2 puffs into the lungs 2 (two) times daily.         No current facility-administered medications on file prior to visit.    Allergies:  Allergies  Allergen Reactions  . Codeine Anaphylaxis  . Etodolac     dizziness    ROS:  Out of a complete 14 system review of symptoms, the patient complains only of the following symptoms, and all other reviewed systems are negative.  Hearing loss Difficulty swallowing Double vision Impotence Joint pain Allergies, runny nose Weakness Tremor Too much sleep, decreased energy, disinterest in activities  Blood pressure 116/72, pulse 70, height 6' 0.25" (1.835 m), weight 265 lb (120.203 kg).  Physical Exam  General: The patient is alert and cooperative at the time of the examination. The patient is moderately to markedly obese.  Head: Pupils are slightly anisocoric, 2 mm on the right and 3 mm on the left, but the pupils are round, and reactive to light. Discs are flat bilaterally. There is slight ptosis on the  left.  Neck: The neck is supple, no carotid bruits are noted.  Respiratory: The respiratory examination is clear.  Cardiovascular: The cardiovascular examination reveals a regular rate and rhythm, no obvious murmurs or rubs are noted.  Skin: Extremities are with 1-2+ edema below the knees bilaterally.  Neurologic Exam  Mental status:  Cranial nerves: Facial symmetry is present. There is good sensation of the face to pinprick and soft touch bilaterally. The strength of the facial muscles and the muscles to head turning and shoulder shrug are normal bilaterally. Speech is well enunciated, no aphasia or dysarthria is noted. Extraocular movements are full. Visual fields are full.  Motor: The motor testing reveals 5 over 5 strength of all 4 extremities. Good symmetric motor tone is noted throughout.  Sensory: Sensory testing is intact to pinprick, soft touch, vibration sensation, and position sense on all 4 extremities, with the exception that there is a stocking pattern pinprick sensory deficit one half way up the legs bilaterally, and a reduction in position sensation on the right foot. Vibration sensation is decreased on both feet. No evidence of extinction is noted.  Coordination: Cerebellar testing reveals good finger-nose-finger and heel-to-shin bilaterally. A mild intention tremor is seen bilaterally with the upper extremities. No tremor is seen at rest.  Gait and station: Gait is slightly wide-based, unsteady. The patient uses a walker for ambulation.  Tandem gait is unsteady. Romberg is negative. No drift is seen.  Reflexes: Deep tendon reflexes are symmetric, but are depressed  bilaterally. Toes are downgoing bilaterally.   Assessment/Plan:  1. Benign essential tremor  2. Diabetic peripheral neuropathy  3. Gait disorder  4. Diplopia  5. Diabetes  The patient has a chronic issue with double vision. The patient will undergo MRI evaluation of the brain, and further blood work  today. The patient has had  a recent thyroid panel that was unremarkable. The patient will not be placed on a daily medication for his tremor, which likely represents a benign essential tremor. The patient is on metoprolol daily, and he has alprazolam to take. If the patient requires a reduction in tremor to get through certain activities such as writing checks, he can take half of a alprazolam tablet for that purpose. The patient will followup in 6 months. The gait disorder may be multifactorial, associated with a neuropathy, the benign essential tremor, and possible cerebrovascular disease.  Jill Alexanders MD 02/23/2013 10:56 AM  [facility] [address] [place], [place] [address]  Phone [phone] Fax [phone]

## 2013-02-27 LAB — VITAMIN B12: Vitamin B-12: 420 pg/mL (ref 211–946)

## 2013-02-27 LAB — ACETYLCHOLINE RECEPTOR, BINDING: AChR Binding Ab, Serum: <0.03 nmol/L (ref 0.00–0.24)

## 2013-02-28 ENCOUNTER — Encounter: Payer: Self-pay | Admitting: Pulmonary Disease

## 2013-02-28 ENCOUNTER — Ambulatory Visit (INDEPENDENT_AMBULATORY_CARE_PROVIDER_SITE_OTHER): Payer: Medicare Other | Admitting: Pulmonary Disease

## 2013-02-28 ENCOUNTER — Other Ambulatory Visit: Payer: Self-pay | Admitting: Family Medicine

## 2013-02-28 VITALS — BP 130/84 | HR 70 | Temp 95.8°F | Ht 72.0 in | Wt 265.8 lb

## 2013-02-28 DIAGNOSIS — J449 Chronic obstructive pulmonary disease, unspecified: Secondary | ICD-10-CM

## 2013-02-28 NOTE — Assessment & Plan Note (Signed)
The patient has had a recent acute exacerbation that I suspect is secondary to acute bronchitis.  He now feels that he is back to baseline, and has no bronchospasm on exam.  I would like to continue him on his current bronchodilator regimen, but may have to add additional medications if he has another acute exacerbation in a short period of time.  I stressed to him the importance of weight loss and conditioning.

## 2013-02-28 NOTE — Patient Instructions (Addendum)
No change in your breathing medications Work on weight loss and some type of conditioning.  followup with me in 52mos if doing well, but call me if having issues.

## 2013-02-28 NOTE — Progress Notes (Signed)
  Subjective:    Patient ID: Kyle [name], male    DOB: July 06, 1934, 79 y.o.   MRN: [id number]  HPI Patient comes in today for followup of his known moderate COPD.  He apparently had an episode of acute bronchitis with COPD exacerbation the middle of July, and required 2 days hospitalization.  This was subsequently complicated by C. Difficile colitis.  The patient feels that he is much improved, and that he is near his baseline from a pulmonary standpoint.  He denies any chest congestion or purulence.  He is significantly weakened from his illnesses, and I stressed to him the importance of conditioning.   Review of Systems  Constitutional: Negative for fever and unexpected weight change.  HENT: Negative for ear pain, nosebleeds, congestion, sore throat, rhinorrhea, sneezing, trouble swallowing, dental problem, postnasal drip and sinus pressure.   Eyes: Negative for redness and itching.  Respiratory: Negative for cough, chest tightness, shortness of breath and wheezing.   Cardiovascular: Negative for palpitations and leg swelling.  Gastrointestinal: Negative for nausea and vomiting.  Genitourinary: Negative for dysuria.  Musculoskeletal: Negative for joint swelling.  Skin: Negative for rash.  Neurological: Negative for headaches.  Hematological: Does not bruise/bleed easily.  Psychiatric/Behavioral: Negative for dysphoric mood. The patient is not nervous/anxious.        Objective:   Physical Exam Overweight male in no acute distress Nose without purulence or discharge noted Neck without lymphadenopathy or thyromegaly Chest totally clear to auscultation, no wheezing Cardiac exam is regular rate and rhythm Lower extremities have mild edema, no cyanosis Alert and oriented, moves all 4 extremities.        Assessment & Plan:

## 2013-03-01 ENCOUNTER — Ambulatory Visit: Payer: Medicare Other | Admitting: Family Medicine

## 2013-03-01 ENCOUNTER — Telehealth: Payer: Self-pay | Admitting: Family Medicine

## 2013-03-01 ENCOUNTER — Telehealth: Payer: Self-pay

## 2013-03-01 NOTE — Telephone Encounter (Signed)
I called and let patient know that his lab results are unremarkable. Patient wants to know the status of his MRI referral. I reviewed and let him know the MRI has been approved but the referral is incomplete. I will find out what this means and get back to him.

## 2013-03-01 NOTE — Telephone Encounter (Signed)
Patients wife is calling to check on blood sugar supplies-pharmacy says they still haven't got a response.    [employer]

## 2013-03-01 NOTE — Telephone Encounter (Signed)
I called this patient back and let him know that I learned that the referral is in the process of being made. He will hear from a radiologist in the next two days and they will schedule that appointment with him.

## 2013-03-01 NOTE — Telephone Encounter (Signed)
Message copied by [facility] on Tue Mar 01, 2013  8:56 AM ------      Message from: Margette Fast      Created: Mon Feb 28, 2013  7:50 AM       Please call the patient. The blood work results are unremarkable. Thank you.            ----- Message -----         From: [facility] Lab Results In Interface         Sent: 02/27/2013   1:37 PM           To: Kathrynn Ducking, MD                   ------

## 2013-03-01 NOTE — Telephone Encounter (Signed)
Left message on voicemail to return call.

## 2013-03-02 ENCOUNTER — Other Ambulatory Visit: Payer: Self-pay

## 2013-03-02 ENCOUNTER — Telehealth: Payer: Self-pay

## 2013-03-02 MED ORDER — ALPRAZOLAM 0.5 MG PO TABS: ORAL_TABLET | ORAL | Status: DC

## 2013-03-02 NOTE — Telephone Encounter (Signed)
Patient was notified and RX faxed to [facility] in [place].

## 2013-03-02 NOTE — Telephone Encounter (Signed)
Ok plus five ref 

## 2013-03-02 NOTE — Telephone Encounter (Signed)
Wife states that patient's xanax has expired and he needs a new RX for his xanax sent to [facility] in [place].

## 2013-03-02 NOTE — Telephone Encounter (Signed)
RX for diabetic testing machine and supplies was faxed to [employer]. Wife was notified.

## 2013-03-04 ENCOUNTER — Encounter (HOSPITAL_COMMUNITY): Payer: Medicare Other | Attending: Internal Medicine

## 2013-03-04 DIAGNOSIS — Z85118 Personal history of other malignant neoplasm of bronchus and lung: Secondary | ICD-10-CM | POA: Insufficient documentation

## 2013-03-04 DIAGNOSIS — Z09 Encounter for follow-up examination after completed treatment for conditions other than malignant neoplasm: Secondary | ICD-10-CM | POA: Insufficient documentation

## 2013-03-04 DIAGNOSIS — C3430 Malignant neoplasm of lower lobe, unspecified bronchus or lung: Secondary | ICD-10-CM

## 2013-03-04 DIAGNOSIS — C3490 Malignant neoplasm of unspecified part of unspecified bronchus or lung: Secondary | ICD-10-CM

## 2013-03-04 DIAGNOSIS — D693 Immune thrombocytopenic purpura: Secondary | ICD-10-CM

## 2013-03-04 LAB — CBC WITH DIFFERENTIAL/PLATELET
Basophils Relative: 0 % (ref 0–1)
Eosinophils Absolute: 0.2 10*3/uL (ref 0.0–0.7)
Eosinophils Relative: 3 % (ref 0–5)
Hemoglobin: 13.4 g/dL (ref 13.0–17.0)
Lymphs Abs: 2.1 10*3/uL (ref 0.7–4.0)
MCH: 30.1 pg (ref 26.0–34.0)
MCHC: 31.8 g/dL (ref 30.0–36.0)
MCV: 94.8 fL (ref 78.0–100.0)
Monocytes Relative: 8 % (ref 3–12)
Neutrophils Relative %: 53 % (ref 43–77)

## 2013-03-04 LAB — COMPREHENSIVE METABOLIC PANEL
ALT: 18 U/L (ref 0–53)
BUN: 21 mg/dL (ref 6–23)
CO2: 27 mEq/L (ref 19–32)
Calcium: 9.6 mg/dL (ref 8.4–10.5)
Creatinine, Ser: 1.64 mg/dL — ABNORMAL HIGH (ref 0.50–1.35)
GFR calc Af Amer: 45 mL/min — ABNORMAL LOW (ref >90)
GFR calc non Af Amer: 39 mL/min — ABNORMAL LOW (ref >90)
Glucose, Bld: 93 mg/dL (ref 70–99)
Sodium: 139 mEq/L (ref 135–145)
Total Protein: 6.8 g/dL (ref 6.0–8.3)

## 2013-03-04 NOTE — Progress Notes (Signed)
Kyle [name] presented for labwork. Labs per MD order drawn via Peripheral Line 23 gauge needle inserted in right AC  Good blood return present. Procedure without incident.  Needle removed intact. Patient tolerated procedure well.  Complains with weakness.  States "I just got over treatment for C.  Diff.  I'm doing better but I'm still weak."

## 2013-03-05 DIAGNOSIS — R279 Unspecified lack of coordination: Secondary | ICD-10-CM

## 2013-03-07 ENCOUNTER — Ambulatory Visit (HOSPITAL_COMMUNITY)
Admission: RE | Admit: 2013-03-07 | Discharge: 2013-03-07 | Disposition: A | Discharge status: Home / Self Care | Payer: Medicare Other | Source: Ambulatory Visit | Attending: Oncology | Admitting: Oncology

## 2013-03-07 ENCOUNTER — Telehealth: Payer: Self-pay | Admitting: Neurology

## 2013-03-07 ENCOUNTER — Other Ambulatory Visit: Payer: Self-pay | Admitting: Neurology

## 2013-03-07 ENCOUNTER — Other Ambulatory Visit (HOSPITAL_COMMUNITY): Payer: Self-pay | Admitting: Oncology

## 2013-03-07 DIAGNOSIS — L905 Scar conditions and fibrosis of skin: Secondary | ICD-10-CM | POA: Insufficient documentation

## 2013-03-07 DIAGNOSIS — K8020 Calculus of gallbladder without cholecystitis without obstruction: Secondary | ICD-10-CM | POA: Insufficient documentation

## 2013-03-07 DIAGNOSIS — J438 Other emphysema: Secondary | ICD-10-CM | POA: Insufficient documentation

## 2013-03-07 DIAGNOSIS — R269 Unspecified abnormalities of gait and mobility: Secondary | ICD-10-CM

## 2013-03-07 DIAGNOSIS — D3500 Benign neoplasm of unspecified adrenal gland: Secondary | ICD-10-CM | POA: Insufficient documentation

## 2013-03-07 DIAGNOSIS — G250 Essential tremor: Secondary | ICD-10-CM

## 2013-03-07 DIAGNOSIS — H532 Diplopia: Secondary | ICD-10-CM

## 2013-03-07 DIAGNOSIS — C3490 Malignant neoplasm of unspecified part of unspecified bronchus or lung: Secondary | ICD-10-CM

## 2013-03-07 DIAGNOSIS — K439 Ventral hernia without obstruction or gangrene: Secondary | ICD-10-CM | POA: Insufficient documentation

## 2013-03-07 DIAGNOSIS — E1142 Type 2 diabetes mellitus with diabetic polyneuropathy: Secondary | ICD-10-CM

## 2013-03-07 DIAGNOSIS — R599 Enlarged lymph nodes, unspecified: Secondary | ICD-10-CM | POA: Insufficient documentation

## 2013-03-07 DIAGNOSIS — I77819 Aortic ectasia, unspecified site: Secondary | ICD-10-CM | POA: Insufficient documentation

## 2013-03-07 DIAGNOSIS — I2510 Atherosclerotic heart disease of native coronary artery without angina pectoris: Secondary | ICD-10-CM | POA: Insufficient documentation

## 2013-03-07 DIAGNOSIS — I700 Atherosclerosis of aorta: Secondary | ICD-10-CM | POA: Insufficient documentation

## 2013-03-07 NOTE — Telephone Encounter (Signed)
I called the patient and I talked with the patient and his wife. The MRI of the brain does not show small vessel disease, diffuse atrophy is seen. The patient is being worked up for a pulmonary nodule, possible recurrent cancer. The patient will have a PET scan. The double vision has been present for 5 or 6 years. The double vision is constant, and the patient could potentially be treated with prisms for glasses, or use an occluder for the glasses lens.

## 2013-03-09 ENCOUNTER — Ambulatory Visit (INDEPENDENT_AMBULATORY_CARE_PROVIDER_SITE_OTHER): Payer: Medicare Other | Admitting: Family Medicine

## 2013-03-09 ENCOUNTER — Encounter: Payer: Self-pay | Admitting: Family Medicine

## 2013-03-09 VITALS — BP 130/72 | Ht 72.0 in | Wt 262.0 lb

## 2013-03-09 DIAGNOSIS — R197 Diarrhea, unspecified: Secondary | ICD-10-CM

## 2013-03-09 MED ORDER — NITROGLYCERIN 0.4 MG SL SUBL: 0.4000 mg | SUBLINGUAL_TABLET | SUBLINGUAL | Status: DC | PRN

## 2013-03-09 NOTE — Progress Notes (Signed)
  Subjective:    Patient ID: [name], male    DOB: [birth year], 79 y.o.   MRN: [id number]  HPIHere for a check up on meds. Patient loss his bottle of xanax. Blood sugars readings brought in today. BS on 8/17 was 101, 8/18 was 113, 08/19 was 109, and 8/20 was 122. A1C on 02/01/13 was 6.2.   Wanting to hold off on new gi consult, though still experiencing loose stools.   overall continues to feel quite weak  Cc a tremendous multitude of specialists see all of their notes.  Fortunately the neurologist felt as I did that the tremor was in essential tremor.    Review of Systems No significant changes no abdominal pain appetite improving energy poor but improving ROS otherwise negative    Objective:   Physical Exam Alert no acute distress. Lungs clear. Heart regular rate and rhythm. HEENT normal. Abdomen large soft good bowel sounds no discrete tenderness.       Assessment & Plan:  Impression type 2 diabetes clinically improved #2 diarrhea question persistence of C. difficile gastroenteritis #3 Gen. fatigue discussed #4 COPD discussed. Plan maintain exercise. Check stool studies. Xanax refilled. Symptomatic care discussed. Recheck in 3 months. WSL

## 2013-03-11 ENCOUNTER — Other Ambulatory Visit: Payer: Self-pay | Admitting: Family Medicine

## 2013-03-12 LAB — CLOSTRIDIUM DIFFICILE EIA: CDIFTX: NEGATIVE

## 2013-03-16 ENCOUNTER — Encounter (HOSPITAL_COMMUNITY)
Admission: RE | Admit: 2013-03-16 | Discharge: 2013-03-16 | Disposition: A | Discharge status: Home / Self Care | Payer: Medicare Other | Source: Ambulatory Visit | Attending: Oncology | Admitting: Oncology

## 2013-03-16 ENCOUNTER — Other Ambulatory Visit (HOSPITAL_COMMUNITY): Payer: Self-pay | Admitting: Oncology

## 2013-03-16 DIAGNOSIS — C3490 Malignant neoplasm of unspecified part of unspecified bronchus or lung: Secondary | ICD-10-CM

## 2013-03-16 MED ORDER — FLUDEOXYGLUCOSE F - 18 (FDG) INJECTION
16.1000 | Freq: Once | INTRAVENOUS | Status: AC | PRN
  Administered 2013-03-16: 16.1 via INTRAVENOUS

## 2013-03-18 ENCOUNTER — Encounter (HOSPITAL_COMMUNITY): Payer: Self-pay | Admitting: Oncology

## 2013-03-18 ENCOUNTER — Encounter (HOSPITAL_BASED_OUTPATIENT_CLINIC_OR_DEPARTMENT_OTHER): Payer: Medicare Other | Admitting: Oncology

## 2013-03-18 VITALS — BP 103/65 | HR 64 | Temp 97.5°F | Resp 18 | Wt 264.6 lb

## 2013-03-18 DIAGNOSIS — A0472 Enterocolitis due to Clostridium difficile, not specified as recurrent: Secondary | ICD-10-CM

## 2013-03-18 DIAGNOSIS — C3490 Malignant neoplasm of unspecified part of unspecified bronchus or lung: Secondary | ICD-10-CM

## 2013-03-18 DIAGNOSIS — C3430 Malignant neoplasm of lower lobe, unspecified bronchus or lung: Secondary | ICD-10-CM

## 2013-03-18 DIAGNOSIS — D696 Thrombocytopenia, unspecified: Secondary | ICD-10-CM

## 2013-03-18 DIAGNOSIS — R059 Cough, unspecified: Secondary | ICD-10-CM

## 2013-03-18 DIAGNOSIS — R05 Cough: Secondary | ICD-10-CM

## 2013-03-18 NOTE — Progress Notes (Signed)
Kyle Battiest, MD [place] [address]  Adenocarcinoma, lung, unspecified laterality  CURRENT THERAPY:Observation and surveillance   INTERVAL HISTORY: Kyle [name] 79 y.o. male returns for  regular  visit for followup of Stage I a (T1 B., N0, M0) moderately differentiated adenocarcinoma the lung, 2.3 cm in size with surgery on 04/03/2011. Margins were clear, no LV I was seen, and there is no pleural involvement. 6 lymph nodes were all negative getting stage IA disease.  AND Thrombocytopenia thus far consistent with chronic ITP of adults versus drug-induced mild thrombocytopenia. He is on too many medications to withdraw them completely. There is no evidence on his Feb 2014 CT scan of splenomegaly or cirrhosis.  I personally reviewed and went over laboratory results with the patient.  Blood work from 03/04/2013 shows a Hgb that is 13.4 g/dL with a WBC of 5.9 and platelet count of 109,000 (which is stable).  He continue to display renal insufficiency.   The majority of our time was discussing his recent CT scan results which led to a PET scan.  The PET scan was negative for any hypermetabolic activity.  Adenopathy was found on his CT scan 1-2 weeks prior to PET scan which led to the PET scan.  The radiologist recommended a short-interval CT of chest and I will get that ordered for November timeframe.  If Ct of chest in November is negative, will continue to follow NCCN guidelines and schedule him for annual CT of chests.   He was recently diagnosed with C. Diff infection after being treated for pneumonia.  This has been treated and most recent PCR of stool was negative for infection.  Will defer to GI.  He reports a cough that is productive of white/grey sputum.  I recommended OTC cough syrup for symptomatic management of this.   He denies any hemoptysis, spontaneous bleeding, gingival bleeding, blood in stool, black tarry stool.   Otherwise, he denies any  complaints and ROS questioning is negative.    Past Medical History  Diagnosis Date  . Arteriosclerotic cardiovascular disease (ASCVD) 1973, 12/2010    S/P NSTEMI secondary to distal RCA/PL lesion, tx medically.  EF of  55%-60% per  echo.  . Diabetes mellitus     Type II  . Thrombocytopenia   . COPD (chronic obstructive pulmonary disease)   . AAA (abdominal aortic aneurysm) 2004    s/p repair 2004; 4.3 cm infrarenal in 05/2011  . Tobacco abuse     50-pack-year consumption; quit in 12/2010  . Hyperlipidemia   . OSA (obstructive sleep apnea)   . Hypertension   . Benign prostatic hypertrophy     s/p transurethral resection of the prostate  . Obesity   . Tubular adenoma of colon 02/20/10    Colon polyps 2003, 2006 as well  . Cataract   . Ventral hernia   . Diverticulosis   . Bilateral renal masses     Cystic, more prominent on CT in 12/2010 than 2007; followed by Dr. Rosana Hoes  . ITP (idiopathic thrombocytopenic purpura) 09/07/2012    Chronic ITP of adults versus medication-induced ITP.  Stable  . Insomnia   . Bladder cancer 1996    Transurethral resection of the bladder + chemotherapy/BCG as premed  . Adenocarcinoma, lung 01/2011    transthoracic FNA; resection of the superior segment of the RLL in 03/2011; negative nodes; no chemotherapy nor radiation planned  . Chronic kidney disease     Creatinine 1.4  on discharge 12/20/2100; proteinuria; normal renal ultrasound in 2010; recent creatinines of 1.7-2.; Bilateral cystic renal masses by CT in 2011  . Nephrolithiasis 2012    ARF in 01/2011 due to obstructing nephrolithiasis  . Arm fracture     right arm  . Essential and other specified forms of tremor 02/23/2013  . Diplopia 02/23/2013  . Polyneuropathy in diabetes(357.2) 02/23/2013  . Abnormality of gait 02/23/2013  . Hx of Clostridium difficile infection     has Arteriosclerotic cardiovascular disease (ASCVD); Hypertension; CKD (chronic kidney disease) stage 3, GFR 30-59 ml/min; COPD (chronic  obstructive pulmonary disease); Adenocarcinoma, lung; Obstructive sleep apnea; Fasting hyperglycemia; Nephrolithiasis; Bladder carcinoma; Diarrhea; Tubular adenoma of colon; Ventral hernia; AAA (abdominal aortic aneurysm); Tobacco abuse; LLQ pain; ITP (idiopathic thrombocytopenic purpura); Type II or unspecified type diabetes mellitus with neurological manifestations, not stated as uncontrolled(250.60); Fracture of right distal radius; COPD exacerbation; Essential and other specified forms of tremor; Diplopia; Polyneuropathy in diabetes(357.2); and Abnormality of gait on his problem list.     is allergic to codeine and etodolac.  Mr. Matsen does not currently have medications on file.  Past Surgical History  Procedure Laterality Date  . Transurethral resection of prostate    . Cystectomy    . Tonsillectomy    . Colonoscopy  03/19/2010    Dr. Gala Romney -(poor prep) Anal papilla, rectal hyperplastic polyp, tubular adenoma removed splenic flexure, left-sided diverticula  . Wedge resection  04/2011    carcinoma of lung  . Colonoscopy  11/28/2004    RMR:  Diminutive rectal and left colon polyps as described above, cold  biopsied/removed/  Left sided diverticula. The remainder of the colonic mucosa appeared normal.  . Colonoscopy   09/15/01    RMR: Multiple diminutive polyps destroyed with dermolysis as described above/ Multiple small polyps on stalks in the colon resected with snare cautery/ Scattered pan colonic diverticulum/ The remainder of the colonic mucosa appeared normal  . Abdominal aortic aneurysm repair  2004  . Cardiac catheterization    . Lung lobectomy      Denies any headaches, dizziness, double vision, fevers, chills, night sweats, nausea, vomiting, diarrhea, constipation, chest pain, heart palpitations, shortness of breath, blood in stool, black tarry stool, urinary pain, urinary burning, urinary frequency, hematuria.   PHYSICAL EXAMINATION  ECOG PERFORMANCE STATUS: 1 - Symptomatic  but completely ambulatory  Filed Vitals:   03/18/13 1000  BP: 103/65  Pulse: 64  Temp: 97.5 F (36.4 C)  Resp: 18    GENERAL:alert, no distress, well nourished, well developed, comfortable, cooperative, obese and smiling SKIN: skin color, texture, turgor are normal, no rashes or significant lesions HEAD: Normocephalic, No masses, lesions, tenderness or abnormalities EYES: normal, PERRLA, EOMI, Conjunctiva are pink and non-injected EARS: External ears normal OROPHARYNX:mucous membranes are moist  NECK: supple, no adenopathy, thyroid normal size, non-tender, without nodularity, no stridor, non-tender, trachea midline LYMPH:  no palpable lymphadenopathy, no hepatosplenomegaly BREAST:not examined LUNGS: clear to auscultation and percussion HEART: regular rate & rhythm, no murmurs, no gallops, S1 normal and S2 normal ABDOMEN:abdomen soft, non-tender, obese, normal bowel sounds, no masses or organomegaly, abdominal exam limited due to body habitus and therefore hepatosplenomegaly difficult to assess.  BACK: Back symmetric, no curvature. EXTREMITIES:less then 2 second capillary refill, no joint deformities, effusion, or inflammation, no skin discoloration, no clubbing, no cyanosis  NEURO: alert & oriented x 3 with fluent speech, no focal motor/sensory deficits, gait normal    LABORATORY DATA: CBC    Component Value Date/Time  WBC 5.9 03/04/2013 1003   RBC 4.45 03/04/2013 1003   HGB 13.4 03/04/2013 1003   HCT 42.2 03/04/2013 1003   PLT 109* 03/04/2013 1003   MCV 94.8 03/04/2013 1003   MCH 30.1 03/04/2013 1003   MCHC 31.8 03/04/2013 1003   RDW 17.2* 03/04/2013 1003   LYMPHSABS 2.1 03/04/2013 1003   MONOABS 0.5 03/04/2013 1003   EOSABS 0.2 03/04/2013 1003   BASOSABS 0.0 03/04/2013 1003      Chemistry      Component Value Date/Time   NA 139 03/04/2013 1003   K 4.5 03/04/2013 1003   CL 103 03/04/2013 1003   CO2 27 03/04/2013 1003   BUN 21 03/04/2013 1003   CREATININE 1.64* 03/04/2013 1003     CREATININE 1.88* 11/13/2011 1049      Component Value Date/Time   CALCIUM 9.6 03/04/2013 1003   ALKPHOS 53 03/04/2013 1003   AST 23 03/04/2013 1003   ALT 18 03/04/2013 1003   BILITOT 1.3* 03/04/2013 1003       RADIOGRAPHIC STUDIES:  Nm Pet Image Restag (ps) Skull Base To Thigh  03/16/2013   *RADIOLOGY REPORT*  Clinical Data: Subsequent treatment strategy for lung cancer.  NUCLEAR MEDICINE PET SKULL BASE TO THIGH  Fasting Blood Glucose:  109  Technique:  16.1 mCi F-18 FDG was injected intravenously. CT data was obtained and used for attenuation correction and anatomic localization only.  (This was not acquired as a diagnostic CT examination.) Additional exam technical data entered on technologist worksheet.  Comparison:  Chest CT 03/07/2013 and prior PET CT 02/17/2011.  Findings:  Neck: No hypermetabolic lymph nodes in the neck.  Chest:  There is a new area of right lower lobe airspace opacity and near the suture line which may be related to the recent lung biopsy day with some resultant  pulmonary hemorrhage.  Low level FDG uptake.Resolution of right-sided pleural effusion with a subpleural nodularity.  No enlarged or metabolically active mediastinal or hilar lymph nodes to suggest malignant adenopathy.  Abdomen/Pelvis:  No abnormal hypermetabolic activity within the liver, pancreas, adrenal glands, or spleen.  No hypermetabolic lymph nodes in the abdomen or pelvis.  Skeleton:  Uptake noted at the sternomanubrial joint but I do not see any definite metastatic bone lesions.  IMPRESSION:  1.  Exam somewhat compromised by post biopsy hemorrhage and inflammation of the right lower lobe.  No definite metabolically active pulmonary lesion and no mediastinal or hilar lymphadenopathy.  Recommend a follow-up chest CT in 3 months to reevaluate the right lower lobe process. 2.  No findings for metastatic disease involving the neck, abdomen or pelvis.   Original Report Authenticated By: Marijo Sanes, M.D.     03/07/2013  *RADIOLOGY REPORT*  Clinical Data: Stage I non-small cell lung cancer diagnosed 2 years  ago. Adenocarcinoma. Surgery.  CT CHEST WITHOUT CONTRAST  Technique: Multidetector CT imaging of the chest was performed  following the standard protocol without IV contrast.  Comparison: Plain film 01/31/2013. CT 08/31/2012.  Findings: Lungs/pleura: Moderate centrilobular emphysema. Stable 1  mm right upper lobe lung nodule on image 30/series 3. Mild  scarring in the posterior left upper lobe.  Surgical changes in the right lower lobe. Worsened aeration in  this area, with volume loss peripheral to the previously described  presumed central postsurgical change. No well-defined, recurrent  mass.  The right lower lobe sub tending bronchi are mildly decreased in  size relative to the prior exam. For example, image 39/series 3.  No pleural  fluid or pleural mass.  Heart/Mediastinum: low density in the periventricular white matter  likely related to small vessel disease. Aortic atherosclerosis  with probable calcified wall thrombus. Normal heart size, without  pericardial effusion. Multivessel coronary artery atherosclerosis.  Lipomatous hypertrophy of the interatrial septum. 1.3 cm  subcarinal node measured 1.0 cm on the prior. Extension towards  the azygo-esophageal recess 1.2 cm on image 34/series 2. This is  increased.  Hilar regions poorly evaluated without intravenous contrast.  Upper abdomen: Cholelithiasis. Right adrenal lesion which is low  density, consistent with an adenoma. 2.8 cm. Vascular versus  pancreatic parenchymal calcification. Incompletely imaged  abdominal aortic dilatation. Ventral abdominal wall hernia, also  incompletely imaged.  Bones/Musculoskeletal: No acute osseous abnormality.  IMPRESSION:  1. Surgical changes within the right lower lobe. Worsened  aeration in the region of surgical sutures with new volume loss and  suggestion of endobronchial  narrowing. Concurrent developing  adenopathy in the subcarinal and azygo-esophageal recess stations,  mild. Findings are suspicious for recurrent or localized  metastatic disease. Potential clinical strategies include  bronchoscopy versus further imaging characterization with PET.  These results will be called to the ordering clinician or  representative by the Radiologist Assistant, and communication  documented in the PACS Dashboard.  2. No evidence of distant thoracic or upper abdominal.  3. Coronary artery atherosclerosis.  4. Right adrenal adenoma.  5. Cholelithiasis.  Original Report Authenticated By: Abigail Miyamoto, M.D.      ASSESSMENT:  1. Stage I a (T1 B., N0, M0) moderately differentiated adenocarcinoma the lung, 2.3 cm in size with surgery on 04/03/2011. Margins were clear, no LV I was seen, and there is no pleural involvement. 6 lymph nodes were all negative getting stage IA disease. CT August 2014 showed developing adenopathy in the subcarinal and azygo-esophageal recess stations, mild, which were characterized as suspicious for recurrent metastatic disease.   PET scan was therefore performed on 03/16/2013 which did not reveal any metabolically active lesions or adenopathy and short interval CT scan is recommended.   2. Thrombocytopenia thus far consistent with chronic ITP of adults versus drug-induced mild thrombocytopenia. He is on too many medications to withdraw them completely. There is no evidence on his April 2013 CT scan of splenomegaly or cirrhosis. Stable.  3. Obesity weighing 276 pounds and 6 feet tall  4. Intermittent diarrhea treated by GI  5. Bladder cancer followed by Dr. Rosana Hoes  6. MI x2 the last in May 2012  7. Abdominal hernia secondary to abdominal aortic aneurysm surgery in 2008  8. Double vision in the past which has not been a recent issue  9. History of skin cancer  10. Renal insufficiency, followed by nephrologist  11. Elevated glucose, followed by Dr.  Wolfgang Phoenix. 12. C. Diff infection in July 2014 after treatment for pneumonia, followed by Dr. Gala Romney. 13. Cough with noninfected sputum   PLAN:  1. I personally reviewed and went over laboratory results with the patient. 2. I personally reviewed and went over radiographic studies with the patient. 3. CT chest w/ contrast in 3 months for close interval follow-up. 4. If next CT of chest is clean, will graduate the patient to annual CT of chests per NCCN guidelines.  5. Labs in 3 months: CBC diff, BMET 6. Recommended OTC cough syrups for cough. 7. Continue follow-up with PCP and GI as directed.  8. Return in 3 months following CT scan to review results.    THERAPY PLAN:  Will perform a close interval CT  scan in November due to lymphadenopathy noted on CT scan in August 2014 followed by PET scan which was negative.  If CT scan in November 2014 is clean, will graduate him to every 1 year surveillance CT scan per NCCN guidelines as he will be 2 years out from surgical intervention for his Stage I NSCLC.  Can perform labs every 6 months at that time for his thrombocytopenia due to stability x 2 years and see him every 6 months.   All questions were answered. The patient knows to call the clinic with any problems, questions or concerns. We can certainly see the patient much sooner if necessary.  Patient and plan discussed with Dr. Orlene Och and he is in agreement with the aforementioned.   Lener Ventresca

## 2013-03-18 NOTE — Patient Instructions (Addendum)
[facility] Discharge Instructions  RECOMMENDATIONS MADE BY THE CONSULTANT AND ANY TEST RESULTS WILL BE SENT TO YOUR REFERRING PHYSICIAN.  Lab work in 3 months. Repeat CT scan in 3 months after lab work. MD appointment after CT scan.  Thank you for choosing [facility] to provide your oncology and hematology care.  To afford each patient quality time with our providers, please arrive at least 15 minutes before your scheduled appointment time.  With your help, our goal is to use those 15 minutes to complete the necessary work-up to ensure our physicians have the information they need to help with your evaluation and healthcare recommendations.    Effective January 1st, 2014, we ask that you re-schedule your appointment with our physicians should you arrive 10 or more minutes late for your appointment.  We strive to give you quality time with our providers, and arriving late affects you and other patients whose appointments are after yours.    Again, thank you for choosing [facility].  Our hope is that these requests will decrease the amount of time that you wait before being seen by our physicians.       _____________________________________________________________  Should you have questions after your visit to [facility], please contact our office at [phone] between the hours of 8:30 a.m. and 5:00 p.m.  Voicemails left after 4:30 p.m. will not be returned until the following business day.  For prescription refill requests, have your pharmacy contact our office with your prescription refill request.

## 2013-04-06 ENCOUNTER — Encounter: Payer: Self-pay | Admitting: Family Medicine

## 2013-04-06 ENCOUNTER — Ambulatory Visit (INDEPENDENT_AMBULATORY_CARE_PROVIDER_SITE_OTHER): Payer: Medicare Other | Admitting: Family Medicine

## 2013-04-06 VITALS — BP 130/72 | Ht 72.0 in | Wt 272.5 lb

## 2013-04-06 DIAGNOSIS — M7020 Olecranon bursitis, unspecified elbow: Secondary | ICD-10-CM

## 2013-04-06 DIAGNOSIS — M7022 Olecranon bursitis, left elbow: Secondary | ICD-10-CM

## 2013-04-06 NOTE — Patient Instructions (Addendum)
This elbow is called olecranon bursitis--can take many weeks to go down  Heel pain is tendonitis/bursitis where the achilles tendon comes into the heel bone. Get heel cups to wear in all shoes and don't go barefoot

## 2013-04-06 NOTE — Progress Notes (Signed)
  Subjective:    Patient ID: Kyle [name], male    DOB: Jul 26, 1933, 79 y.o.   MRN: [id number]  HPI Patient has a knot/cyst that has developed on his left elbow. He first noticed it yesterday. It is swollen and not much pain is noted in the area.   Patient also states that he is having right heel pain and it has been bothering him for about 8 weeks now. He states that it is very tender to walk on.  Positive pain and tenderness. Sore at times.  Saw foot doc who mentioned tendonitis     Review of Systems    no fever no chills no rash no abdominal pain Objective:   Physical Exam  Alert no acute distress HEENT normal. Lungs clear heart regular in rhythm. Left posterior elbow swollen enlarged old transverse soft nontender right posterior heel exquisitely tender at Achilles tendon insertion site      Assessment & Plan:  Impression distal Achilles tendinitis discussed #2 old #is discussed. Plan hold off on systemic treatment due to side effect risk local measures discussed expect very slow resolution on elbow. WSL followup regular appointment

## 2013-04-11 ENCOUNTER — Encounter: Payer: Self-pay | Admitting: Internal Medicine

## 2013-04-15 ENCOUNTER — Encounter: Payer: Self-pay | Admitting: Internal Medicine

## 2013-05-11 ENCOUNTER — Telehealth: Payer: Self-pay | Admitting: Family Medicine

## 2013-05-11 MED ORDER — FLUTICASONE PROPIONATE 50 MCG/ACT NA SUSP: 2.0000 | Freq: Every day | NASAL | Status: DC

## 2013-05-11 NOTE — Telephone Encounter (Signed)
Medication sent to pharmacy. Patient was notified.  

## 2013-05-11 NOTE — Telephone Encounter (Signed)
fluticasone (FLONASE) 50 MCG/ACT nasal spray  Pt needs refill on this med please Send to [facility]

## 2013-05-18 ENCOUNTER — Telehealth: Payer: Self-pay | Admitting: Family Medicine

## 2013-05-18 ENCOUNTER — Telehealth: Payer: Self-pay | Admitting: Pulmonary Disease

## 2013-05-18 MED ORDER — LORATADINE 10 MG PO TABS: 10.0000 mg | ORAL_TABLET | Freq: Every day | ORAL | Status: DC

## 2013-05-18 NOTE — Telephone Encounter (Signed)
claritin 10 qd thirty 11 ref

## 2013-05-18 NOTE — Telephone Encounter (Signed)
Rx sent electronically to pharmacy. Patient notified. 

## 2013-05-18 NOTE — Telephone Encounter (Signed)
Kyle [name] returned triage's call.  [name]

## 2013-05-18 NOTE — Telephone Encounter (Signed)
[facility] nurse called to states she has assessed Kyle [name] due to a cough.  States his exam for her looked good, O2 was 98% on room air, Lungs were clear.  She is going to send over her assessment for review.  States he has been taking Delsym for this cough and this is not helping the cough at all.  She believes it is Allergy related.  Can something be phoned in for these symptoms to [facility] in [place]  Thanks

## 2013-05-18 NOTE — Telephone Encounter (Signed)
lmomtcb x1 for [employer]

## 2013-05-18 NOTE — Telephone Encounter (Signed)
Spoke with [facility]-- Received call from pt c/o increased cough--[name] visited pt to check up and she states that pt did not seem to be Acute or in any distress. Sats between 83-84% RA with amb and 90-93% RA at rest [name] notes some R sided wheezes NO prod cough  Pt c/o cough x 4 days during daytime-- hard uncontrollable cough intermittent during day No change in color of sputum--No fever. Pt instructed to use HFA Currently using OTC Mucinex and Delsym q12hr-- no relief  Pt requests Rx for cough-- something that will not make him drowsy Pt states that he does not have cough at night.  Allergies  Allergen Reactions  . Codeine Anaphylaxis  . Etodolac     dizziness   [facility]  Aware that [facility] out of office until 05/23/13--aware that MW will advise.  Please advise Dr Melvyn Novas. Thanks.

## 2013-05-18 NOTE — Telephone Encounter (Signed)
[name] advised. [place] Bing, CMA

## 2013-05-18 NOTE — Telephone Encounter (Signed)
Delsym 2 tsp every 12 hours as needed

## 2013-05-31 ENCOUNTER — Other Ambulatory Visit: Payer: Self-pay | Admitting: *Deleted

## 2013-05-31 MED ORDER — GLYBURIDE 2.5 MG PO TABS: 2.5000 mg | ORAL_TABLET | Freq: Every day | ORAL | Status: DC

## 2013-06-03 ENCOUNTER — Ambulatory Visit (INDEPENDENT_AMBULATORY_CARE_PROVIDER_SITE_OTHER): Payer: Medicare Other | Admitting: Internal Medicine

## 2013-06-03 ENCOUNTER — Encounter: Payer: Self-pay | Admitting: Internal Medicine

## 2013-06-03 ENCOUNTER — Other Ambulatory Visit (INDEPENDENT_AMBULATORY_CARE_PROVIDER_SITE_OTHER): Payer: Medicare Other

## 2013-06-03 VITALS — BP 110/70 | HR 62 | Ht 72.0 in | Wt 269.0 lb

## 2013-06-03 DIAGNOSIS — R197 Diarrhea, unspecified: Secondary | ICD-10-CM

## 2013-06-03 DIAGNOSIS — R195 Other fecal abnormalities: Secondary | ICD-10-CM

## 2013-06-03 DIAGNOSIS — A09 Infectious gastroenteritis and colitis, unspecified: Secondary | ICD-10-CM

## 2013-06-03 LAB — CBC WITH DIFFERENTIAL/PLATELET
Eosinophils Absolute: 0.3 10*3/uL (ref 0.0–0.7)
Eosinophils Relative: 3.5 % (ref 0.0–5.0)
MCHC: 33.4 g/dL (ref 30.0–36.0)
MCV: 93.4 fl (ref 78.0–100.0)
Monocytes Absolute: 0.7 10*3/uL (ref 0.1–1.0)
Neutrophils Relative %: 51.8 % (ref 43.0–77.0)
Platelets: 165 10*3/uL (ref 150.0–400.0)
WBC: 8.7 10*3/uL (ref 4.5–10.5)

## 2013-06-03 LAB — IBC PANEL
Iron: 51 ug/dL (ref 42–165)
Transferrin: 217.1 mg/dL (ref 212.0–360.0)

## 2013-06-03 MED ORDER — METRONIDAZOLE 250 MG PO TABS: 250.0000 mg | ORAL_TABLET | Freq: Three times a day (TID) | ORAL | Status: DC

## 2013-06-03 NOTE — Progress Notes (Signed)
Kyle [name] 07-29-1933 MRN [id number]   History of Present Illness:  This is a 79 year old white male with chronic diarrhea fully evaluated by Dr. Gala Romney. He describes pasty and watery stools which are at times incontinent. There is no blood in the stool and essentially no abdominal pain. Most of the diarrhea occurs during the day but he has occasional nocturnal episodes as well. There has been no significant weight loss. His last colonoscopy was in August 2011 with findings of tubular adenoma and hyperplastic polyps. He has a history of C. difficile colitis in August 2014 which was treated with Flagyl. His symptoms improved but his malodorous stools have persisted. A repeat C. difficile toxin was negative. There is a history of lung cancer stage I T1B, tumor was 2.3 cm size. It was resected in September 2012. He also has COPD and chronic ITP. Platelet counts were 105,000 and stable. A CT scan of the abdomen showed  Normal size  spleen and  no evidence of recurrent cancer.   Past Medical History  Diagnosis Date  . Arteriosclerotic cardiovascular disease (ASCVD) 1973, 12/2010    S/P NSTEMI secondary to distal RCA/PL lesion, tx medically.  EF of  55%-60% per  echo.  . Diabetes mellitus     Type II  . Thrombocytopenia   . COPD (chronic obstructive pulmonary disease)   . AAA (abdominal aortic aneurysm) 2004    s/p repair 2004; 4.3 cm infrarenal in 05/2011  . Tobacco abuse     50-pack-year consumption; quit in 12/2010  . Hyperlipidemia   . OSA (obstructive sleep apnea)   . Hypertension   . Benign prostatic hypertrophy     s/p transurethral resection of the prostate  . Obesity   . Tubular adenoma of colon 02/20/10    Colon polyps 2003, 2006 as well  . Cataract   . Ventral hernia   . Diverticulosis   . Bilateral renal masses     Cystic, more prominent on CT in 12/2010 than 2007; followed by Dr. Rosana Hoes  . ITP (idiopathic thrombocytopenic purpura) 09/07/2012    Chronic ITP of adults versus  medication-induced ITP.  Stable  . Insomnia   . Bladder cancer 1996    Transurethral resection of the bladder + chemotherapy/BCG as premed  . Adenocarcinoma, lung 01/2011    transthoracic FNA; resection of the superior segment of the RLL in 03/2011; negative nodes; no chemotherapy nor radiation planned  . Chronic kidney disease     Creatinine 1.4 on discharge 12/20/2100; proteinuria; normal renal ultrasound in 2010; recent creatinines of 1.7-2.; Bilateral cystic renal masses by CT in 2011  . Nephrolithiasis 2012    ARF in 01/2011 due to obstructing nephrolithiasis  . Arm fracture     right arm  . Essential and other specified forms of tremor 02/23/2013  . Diplopia 02/23/2013  . Polyneuropathy in diabetes(357.2) 02/23/2013  . Abnormality of gait 02/23/2013  . Hx of Clostridium difficile infection    Past Surgical History  Procedure Laterality Date  . Transurethral resection of prostate    . Cystectomy    . Tonsillectomy    . Colonoscopy  03/19/2010    Dr. Gala Romney -(poor prep) Anal papilla, rectal hyperplastic polyp, tubular adenoma removed splenic flexure, left-sided diverticula  . Wedge resection  04/2011    carcinoma of lung  . Colonoscopy  11/28/2004    RMR:  Diminutive rectal and left colon polyps as described above, cold  biopsied/removed/  Left sided diverticula. The remainder of the colonic mucosa  appeared normal.  . Colonoscopy   09/15/01    RMR: Multiple diminutive polyps destroyed with dermolysis as described above/ Multiple small polyps on stalks in the colon resected with snare cautery/ Scattered pan colonic diverticulum/ The remainder of the colonic mucosa appeared normal  . Abdominal aortic aneurysm repair  2004  . Cardiac catheterization    . Lung lobectomy      reports that he quit smoking about 5 months ago. His smoking use included Cigarettes. He has a 50 pack-year smoking history. He has never used smokeless tobacco. He reports that he does not drink alcohol or use illicit  drugs. family history includes Aortic aneurysm in his father and mother; Arthritis in his father; Clotting disorder in his father; Diabetes in his father; Emphysema in his father; Hypertension in his father; Other in his paternal grandfather; Stroke in his paternal grandmother; Tremor in his father. Allergies  Allergen Reactions  . Codeine Anaphylaxis  . Etodolac     dizziness        Review of Systems: Denies heartburn dysphagia or chest pain or abdominal pain  The remainder of the 10 point ROS is negative except as outlined in H&P   Physical Exam: General appearance  Well developed, in no distress. Overweight Eyes- non icteric. HEENT nontraumatic, normocephalic. Mouth no lesions, tongue papillated, no cheilosis. Neck supple without adenopathy, thyroid not enlarged, no carotid bruits, no JVD. Lungs Clear to auscultation bilaterally. Cor normal S1, normal S2, regular rhythm, no murmur,  quiet precordium. Abdomen: Large protuberant with large ventral hernia through an incision extending from lower abdomen to epigastrium. It is soft and nontender. Bowel sounds are slightly abnormal.. There is minimal tenderness in left and right lower quadrants. The exam is limited because of the large size of his abdomen. Rectal: Soft yellow pasty Hemoccult-positive stool. Extremities trace pedal edema. Skin no lesions. Neurological alert and oriented x 3. Psychological normal mood and affect.  Assessment and Plan:  Problem #49 79 year old white male with subacute diarrhea alternating with soft malodorous pasty stools since about 6 months ago. He tested positive for C.Diff toxin by PCR in August 2014 and was treated for it... I am still considering the possibility of persistent infectious colitis either secondary to bacterial overgrowth or sprue although that would be less likely. He could have new onset inflammatory bowel disease, microscopic colitis or post infectious colitis   . I will put him back  on Flagyl 250 mg 3 times a day and schedule  flexible sigmoidoscopy and random biopsies . We will also check  iron studies, sedimentation rate and sprue profile. He will continue on probiotics.   06/03/2013 Delfin Edis

## 2013-06-03 NOTE — Patient Instructions (Addendum)
You have been scheduled for a flexible sigmoidoscopy. Please follow the written instructions given to you at your visit today. If you use inhalers (even only as needed), please bring them with you on the day of your procedure.  Your physician has requested that you go to the basement for the following lab work before leaving today: CBC, IBC, Sed Rate, Celiac Panel  We have sent the following medications to your pharmacy for you to pick up at your convenience: Flagyl  Cc: Dr Sallee Lange, Dr Sheldon Silvan, [place]

## 2013-06-06 ENCOUNTER — Telehealth: Payer: Self-pay | Admitting: Internal Medicine

## 2013-06-06 NOTE — Telephone Encounter (Signed)
I called patient back and per patient's wife Saturday and Sunday [place] did not have Flagyl. Then about twenty minutes after patient called and left message, Kyle [name] called and said prescription is ready for pickup. Patient was on his way to the pharmacy per patient's wife I advised patient's wife to call back if there is any further problems [name] verbalized understanding

## 2013-06-07 ENCOUNTER — Encounter: Payer: Self-pay | Admitting: Internal Medicine

## 2013-06-07 ENCOUNTER — Ambulatory Visit (AMBULATORY_SURGERY_CENTER): Payer: Medicare Other | Admitting: Internal Medicine

## 2013-06-07 VITALS — BP 125/67 | HR 49 | Temp 96.5°F | Resp 25 | Ht 72.0 in | Wt 269.0 lb

## 2013-06-07 DIAGNOSIS — D126 Benign neoplasm of colon, unspecified: Secondary | ICD-10-CM

## 2013-06-07 DIAGNOSIS — R197 Diarrhea, unspecified: Secondary | ICD-10-CM

## 2013-06-07 LAB — GLUCOSE, CAPILLARY
Glucose-Capillary: 76 mg/dL (ref 70–99)
Glucose-Capillary: 151 mg/dL — ABNORMAL HIGH (ref 70–99)

## 2013-06-07 LAB — CELIAC PANEL 10
Gliadin IgA: 4.3 U/mL (ref <20)
Tissue Transglut Ab: 9.2 U/mL (ref <20)
Tissue Transglutaminase Ab, IgA: 3.4 U/mL (ref <20)

## 2013-06-07 MED ORDER — SODIUM CHLORIDE 0.9 % IV SOLN: 500.0000 mL | INTRAVENOUS | Status: DC

## 2013-06-07 NOTE — Progress Notes (Signed)
Called to room to assist during endoscopic procedure.  Patient ID and intended procedure confirmed with present staff. Received instructions for my participation in the procedure from the performing physician.  

## 2013-06-07 NOTE — Patient Instructions (Signed)
YOU HAD AN ENDOSCOPIC PROCEDURE TODAY AT [facility]: Refer to the procedure report that was given to you for any specific questions about what was found during the examination.  If the procedure report does not answer your questions, please call your gastroenterologist to clarify.  If you requested that your care partner not be given the details of your procedure findings, then the procedure report has been included in a sealed envelope for you to review at your convenience later.  YOU SHOULD EXPECT: Some feelings of bloating in the abdomen. Passage of more gas than usual.  Walking can help get rid of the air that was put into your GI tract during the procedure and reduce the bloating. If you had a lower endoscopy (such as a colonoscopy or flexible sigmoidoscopy) you may notice spotting of blood in your stool or on the toilet paper. If you underwent a bowel prep for your procedure, then you may not have a normal bowel movement for a few days.  DIET: Your first meal following the procedure should be a light meal and then it is ok to progress to your normal diet.  A half-sandwich or bowl of soup is an example of a good first meal.  Heavy or fried foods are harder to digest and may make you feel nauseous or bloated.  Likewise meals heavy in dairy and vegetables can cause extra gas to form and this can also increase the bloating.  Drink plenty of fluids but you should avoid alcoholic beverages for 24 hours.  ACTIVITY: Your care partner should take you home directly after the procedure.  You should plan to take it easy, moving slowly for the rest of the day.  You can resume normal activity the day after the procedure however you should NOT DRIVE or use heavy machinery for 24 hours (because of the sedation medicines used during the test).    SYMPTOMS TO REPORT IMMEDIATELY: A gastroenterologist can be reached at any hour.  During normal business hours, 8:30 AM to 5:00 PM Monday through Friday,  call [phone].  After hours and on weekends, please call the GI answering service at [phone] who will take a message and have the physician on call contact you.   Following lower endoscopy (colonoscopy or flexible sigmoidoscopy):  Excessive amounts of blood in the stool  Significant tenderness or worsening of abdominal pains  Swelling of the abdomen that is new, acute  Fever of 100F or higher     FOLLOW UP: If any biopsies were taken you will be contacted by phone or by letter within the next 1-3 weeks.  Call your gastroenterologist if you have not heard about the biopsies in 3 weeks.  Our staff will call the home number listed on your records the next business day following your procedure to check on you and address any questions or concerns that you may have at that time regarding the information given to you following your procedure. This is a courtesy call and so if there is no answer at the home number and we have not heard from you through the emergency physician on call, we will assume that you have returned to your regular daily activities without incident.  SIGNATURES/CONFIDENTIALITY: You and/or your care partner have signed paperwork which will be entered into your electronic medical record.  These signatures attest to the fact that that the information above on your After Visit Summary has been reviewed and is understood.  Full responsibility of the  confidentiality of this discharge information lies with you and/or your care-partner.  Diverticulosis and low residue diet information given. Polyp information given. Await biopsy results. Continue Flagyl 250mg  3 times a day to complete 10 day course. Continue probiotic.

## 2013-06-07 NOTE — Progress Notes (Signed)
Patient did not experience any of the following events: a burn prior to discharge; a fall within the facility; wrong site/side/patient/procedure/implant event; or a hospital transfer or hospital admission upon discharge from the facility. (G8907) Patient did not have preoperative order for IV antibiotic SSI prophylaxis. (G8918)  

## 2013-06-07 NOTE — Op Note (Signed)
[facility]  [address] [place], [address]   FLEXIBLE SIGMOIDOSCOPY PROCEDURE REPORT  PATIENT: Kyle, [name]  MR#: [id number] BIRTHDATE: 1934-06-07 , 64  yrs. old GENDER: Male ENDOSCOPIST: Lafayette Dragon, MD REFERRED BY: Rosemary Holms, M.D. PROCEDURE DATE:  06/07/2013 PROCEDURE:   Sigmoidoscopy with biopsy ASA CLASS:   Class III INDICATIONS:ddiarrhea for several months.  History of C.  difficile colitis in August 2014 treated.  Continues to have incontinent stools.  Diabetic.  Stool studies negative.  Rule out microscopic colitis. MEDICATIONS: MAC sedation, administered by CRNA and propofol (Diprivan) 150mg  IV  DESCRIPTION OF PROCEDURE:   After the risks benefits and alternatives of the procedure were thoroughly explained, informed consent was obtained.  revealed decreased sphincter tone. The LB [id number]-H190 [id number]  endoscope was introduced through the anus  and advanced to the splenic flexure , limited by No adverse events experienced.   The quality of the prep was fair .  The instrument was then slowly withdrawn as the mucosa was fully examined.         COLON FINDINGS: Rectal sphincter tone was slightly diminished. There was small amount of dark formed stool throughout the left colon..  There were scattered diverticuli above 20 cm.  Mucosa appeared normal.  There was a small 5 mm polyp at 70 cm which was removed with cold biopsy forceps and sent to pathology.  Colon was examined up to the level of 80 cm at splenic flexure.  Multiple random biopsies were taken from the left colon to rule out microscopic colitis. Retroflexed views revealed no abnormalities.    The scope was then withdrawn from the patient and the procedure terminated.  COMPLICATIONS: There were no complications.  ENDOSCOPIC IMPRESSION: Rectal sphincter tone was slightly diminished.  There was small amount of dark warm stool throughout the left colon..  There were scattered  diverticuli above 20 cm.  Mucosa appeared normal.  There was a small 5 mm polyp at 70 cm which was removed with cold biopsy forceps and sent to pathology.  Colon was examined up to the level of 80 cm at splenic flexure.  Multiple random biopsies were taken from the left colon to rule out microscopic colitis . essentially normal exam of the left colon the splenic flexure. Status post multiple biopsies to rule out microscopic colitis 3 mm polyp removed from 70 cm Small internal hemorrhoids  RECOMMENDATIONS: await biopsy results continue Flagyl 250 mg 3 times a day to complete a 10 day course Low residue diet Continue probiotic     REPEAT EXAM: no   _______________________________ eSignedLafayette Dragon, MD 06/07/2013 2:33 PM   CC:  PATIENT NAME:  Kyle [name] MR#: [id number]

## 2013-06-08 ENCOUNTER — Telehealth: Payer: Self-pay | Admitting: *Deleted

## 2013-06-08 NOTE — Telephone Encounter (Signed)
  Follow up Call-  Call back number 06/07/2013  Post procedure Call Back phone  # [phone]  Permission to leave phone message Yes     Patient questions:  Do you have a fever, pain , or abdominal swelling? no Pain Score  0 *  Have you tolerated food without any problems? yes  Have you been able to return to your normal activities? yes  Do you have any questions about your discharge instructions: Diet   no Medications  no Follow up visit  no  Do you have questions or concerns about your Care? no  Actions: * If pain score is 4 or above: No action needed, pain <4.

## 2013-06-09 ENCOUNTER — Other Ambulatory Visit: Payer: Self-pay | Admitting: Family Medicine

## 2013-06-09 ENCOUNTER — Encounter: Payer: Self-pay | Admitting: Internal Medicine

## 2013-06-10 ENCOUNTER — Ambulatory Visit (INDEPENDENT_AMBULATORY_CARE_PROVIDER_SITE_OTHER): Payer: Medicare Other | Admitting: Family Medicine

## 2013-06-10 ENCOUNTER — Encounter: Payer: Self-pay | Admitting: Family Medicine

## 2013-06-10 VITALS — BP 122/70 | Ht 72.0 in | Wt 268.0 lb

## 2013-06-10 DIAGNOSIS — E119 Type 2 diabetes mellitus without complications: Secondary | ICD-10-CM

## 2013-06-10 DIAGNOSIS — E782 Mixed hyperlipidemia: Secondary | ICD-10-CM

## 2013-06-10 DIAGNOSIS — Z79899 Other long term (current) drug therapy: Secondary | ICD-10-CM

## 2013-06-10 DIAGNOSIS — Z23 Encounter for immunization: Secondary | ICD-10-CM

## 2013-06-10 LAB — LIPID PANEL
Cholesterol: 130 mg/dL (ref 0–200)
HDL: 26 mg/dL — ABNORMAL LOW (ref >39)
LDL Cholesterol: 73 mg/dL (ref 0–99)
Total CHOL/HDL Ratio: 5 Ratio
Triglycerides: 154 mg/dL — ABNORMAL HIGH (ref <150)
VLDL: 31 mg/dL (ref 0–40)

## 2013-06-10 LAB — HEPATIC FUNCTION PANEL
ALT: 15 U/L (ref 0–53)
AST: 18 U/L (ref 0–37)
Albumin: 4 g/dL (ref 3.5–5.2)
Bilirubin, Direct: 0.2 mg/dL (ref 0.0–0.3)
Total Protein: 6.3 g/dL (ref 6.0–8.3)

## 2013-06-10 LAB — POCT GLYCOSYLATED HEMOGLOBIN (HGB A1C): Hemoglobin A1C: 5.1

## 2013-06-10 MED ORDER — METOPROLOL TARTRATE 50 MG PO TABS: 50.0000 mg | ORAL_TABLET | Freq: Two times a day (BID) | ORAL | Status: DC

## 2013-06-10 NOTE — Progress Notes (Signed)
  Subjective:    Patient ID: Kyle [name], male    DOB: 12-Nov-1933, 79 y.o.   MRN: [id number]  HPIHere for a diabetic check up. Pt states his blood sugars have been up in the 200's fasting. Numbers often elevated in the morning., better control last monthA1C today 5.1.   Requesting tetanus vaccine.   Discuss when colonoscopy is due. Patient has been experiencing chronic diarrhea. Dr Olevia Perches just did a sigmoid exam. Currently patient is on a trial of metronidazole for the diarrhea.  Patient reports no significant shortness of breath. If anything his energy is improved. He's not sure why but he is not arguing with   States overall mood is good. Remains on Celexa.  Wonders why we switched to glipizide from glyburide/metformin 7 nature of this.  Compliant with her blood pressure medication. Review of Systems    no chest pain no back pain no abdominal pain chronic diarrhea as noted. Followed by specialist. ROS otherwise negative Objective:   Physical Exam  Alert no apparent distress. Lungs clear distant breath sounds as per stable.Marland Kitchen Heart regular in rhythm. H&T normal. Ankles without edema.      Assessment & Plan:  Impression type 2 diabetes control tied but no hypoglycemic episodes discussed will maintain same dose. #2 hypertension good control. #3 COPD clinically stable. #4 chronic diarrhea followed by specialist. #5 hyperlipidemia control uncertain. Plan tetanus shot. Diet exercise discussed. Appropriate blood work. Further recommendations based on results. WSL

## 2013-06-13 ENCOUNTER — Ambulatory Visit (HOSPITAL_COMMUNITY): Payer: Medicare Other | Admitting: Oncology

## 2013-06-13 ENCOUNTER — Encounter: Payer: Self-pay | Admitting: Internal Medicine

## 2013-06-15 ENCOUNTER — Other Ambulatory Visit (HOSPITAL_COMMUNITY): Payer: Self-pay | Admitting: Oncology

## 2013-06-15 ENCOUNTER — Ambulatory Visit (HOSPITAL_COMMUNITY)
Admission: RE | Admit: 2013-06-15 | Discharge: 2013-06-15 | Disposition: A | Discharge status: Home / Self Care | Payer: Medicare Other | Source: Ambulatory Visit | Attending: Oncology | Admitting: Oncology

## 2013-06-15 DIAGNOSIS — K8020 Calculus of gallbladder without cholecystitis without obstruction: Secondary | ICD-10-CM | POA: Insufficient documentation

## 2013-06-15 DIAGNOSIS — R599 Enlarged lymph nodes, unspecified: Secondary | ICD-10-CM | POA: Insufficient documentation

## 2013-06-15 DIAGNOSIS — C3490 Malignant neoplasm of unspecified part of unspecified bronchus or lung: Secondary | ICD-10-CM

## 2013-06-15 DIAGNOSIS — I714 Abdominal aortic aneurysm, without rupture, unspecified: Secondary | ICD-10-CM | POA: Insufficient documentation

## 2013-06-15 DIAGNOSIS — J984 Other disorders of lung: Secondary | ICD-10-CM | POA: Insufficient documentation

## 2013-06-15 DIAGNOSIS — E278 Other specified disorders of adrenal gland: Secondary | ICD-10-CM | POA: Insufficient documentation

## 2013-06-15 LAB — POCT I-STAT CREATININE: Creatinine, Ser: 2.3 mg/dL — ABNORMAL HIGH (ref 0.50–1.35)

## 2013-06-15 MED ORDER — IOHEXOL 300 MG/ML  SOLN
80.0000 mL | Freq: Once | INTRAMUSCULAR | Status: AC | PRN
  Administered 2013-06-15: 80 mL via INTRAVENOUS

## 2013-06-16 ENCOUNTER — Encounter: Payer: Self-pay | Admitting: Family Medicine

## 2013-06-17 ENCOUNTER — Other Ambulatory Visit (HOSPITAL_COMMUNITY): Payer: Medicare Other

## 2013-06-17 ENCOUNTER — Ambulatory Visit (HOSPITAL_COMMUNITY): Payer: Medicare Other

## 2013-06-19 ENCOUNTER — Encounter (HOSPITAL_COMMUNITY): Payer: Self-pay | Admitting: Oncology

## 2013-06-19 NOTE — Progress Notes (Signed)
Kyle Battiest, MD [place] [address]  Adenocarcinoma, lung, right - Plan: CBC with Differential, Comprehensive metabolic panel, CT Chest Wo Contrast  ITP (idiopathic thrombocytopenic purpura) - Plan: CBC with Differential  CURRENT THERAPY: Surveillance per NCCN guidelines.   INTERVAL HISTORY: [name] 79 y.o. male returns for  regular  visit for followup of Stage I a (T1 B., N0, M0) moderately differentiated adenocarcinoma the lung, 2.3 cm in size with surgery on 04/03/2011. Margins were clear, no LV I was seen, and there is no pleural involvement. 6 lymph nodes were all negative getting stage IA disease.  AND Thrombocytopenia thus far consistent with chronic ITP of adults versus drug-induced mild thrombocytopenia. He is on too many medications to withdraw them completely. There is no evidence on his Feb 2014 CT scan of splenomegaly or cirrhosis.    Adenocarcinoma of right lung   04/03/2011 Initial Diagnosis INVASIVE MODERATELY DIFFERENTIATED ADENOCARCINOMA, 2.3 CM. (T1b, N0).  Clear margins, no LVI, no pleural involvement. 0/6 nodes.   04/03/2011 Surgery Right lower lobe superior segmentectomy by Dr. Arlyce Dice   04/04/2011 Remission    I personally reviewed and went over laboratory results with the patient.  Labs from 06/03/2013 shows a WBC of 8.7, Hgb 14.6 g/dL, and platelet count of 165,000.  I personally reviewed and went over radiographic studies with the patient.  The full report follows, but there are a number of findings that will require close interval follow-up.    NCCN guidelines for Non-Small Cell Lung Cancer Surveillance are as follows:  A. H+P every 6-12 months  B. CT chest every 6-12 months x 2 years and then annually   C. PET scan not typically indicated for surveillance  Despite these guidelines, we will need to perform a closer surveillance CT scan of chest due to recent findings.  I provided the patient information regarding that fact  that the aforementioned information are simply guidelines and therefore can be adapted on a patient-to-patient basis depending on insurance company approval.   He admits to recovering from a "cold."  He admits to a cough that is productive of clear sputum.  He denies any fevers or chills.  There is no indication for antibiotic requirement at this time.   Oncologically, he denies any complaints and ROS questioning is negative.     Past Medical History  Diagnosis Date  . Arteriosclerotic cardiovascular disease (ASCVD) 1973, 12/2010    S/P NSTEMI secondary to distal RCA/PL lesion, tx medically.  EF of  55%-60% per  echo.  . Diabetes mellitus     Type II  . Thrombocytopenia   . COPD (chronic obstructive pulmonary disease)   . AAA (abdominal aortic aneurysm) 2004    s/p repair 2004; 4.3 cm infrarenal in 05/2011  . Tobacco abuse     50-pack-year consumption; quit in 12/2010  . Hyperlipidemia   . OSA (obstructive sleep apnea)   . Hypertension   . Benign prostatic hypertrophy     s/p transurethral resection of the prostate  . Obesity   . Tubular adenoma of colon 02/20/10    Colon polyps 2003, 2006 as well  . Cataract   . Ventral hernia   . Diverticulosis   . Bilateral renal masses     Cystic, more prominent on CT in 12/2010 than 2007; followed by Dr. Rosana Hoes  . ITP (idiopathic thrombocytopenic purpura) 09/07/2012    Chronic ITP of adults versus medication-induced ITP.  Stable  . Insomnia   .  Bladder cancer 1996    Transurethral resection of the bladder + chemotherapy/BCG as premed  . Adenocarcinoma, lung 01/2011    transthoracic FNA; resection of the superior segment of the RLL in 03/2011; negative nodes; no chemotherapy nor radiation planned  . Chronic kidney disease     Creatinine 1.4 on discharge 12/20/2100; proteinuria; normal renal ultrasound in 2010; recent creatinines of 1.7-2.; Bilateral cystic renal masses by CT in 2011  . Nephrolithiasis 2012    ARF in 01/2011 due to obstructing  nephrolithiasis  . Arm fracture     right arm  . Essential and other specified forms of tremor 02/23/2013  . Diplopia 02/23/2013  . Polyneuropathy in diabetes(357.2) 02/23/2013  . Abnormality of gait 02/23/2013  . Hx of Clostridium difficile infection   . Adenocarcinoma of right lung 02/24/2011    Ct A/P 2012:  2cm lung mass RLL PET 0000000:  Hypermetabolic RLL mass, no other hypermetabolic areas. TTNA 02/2011:  Adenocarcinoma, markers c/w lung origin Right lower lobe superior segmentectomy. 04/01/2011 Dr. Arlyce Dice     has Arteriosclerotic cardiovascular disease (ASCVD); Hypertension; CKD (chronic kidney disease) stage 3, GFR 30-59 ml/min; COPD (chronic obstructive pulmonary disease); Adenocarcinoma of right lung; Obstructive sleep apnea; Fasting hyperglycemia; Nephrolithiasis; Bladder carcinoma; Diarrhea; Tubular adenoma of colon; Ventral hernia; AAA (abdominal aortic aneurysm); Tobacco abuse; LLQ pain; ITP (idiopathic thrombocytopenic purpura); Type II or unspecified type diabetes mellitus with neurological manifestations, not stated as uncontrolled(250.60); Fracture of right distal radius; COPD exacerbation; Essential and other specified forms of tremor; Diplopia; Polyneuropathy in diabetes(357.2); and Abnormality of gait on his problem list.     is allergic to codeine and etodolac.  [name] had no medications administered during this visit.  Past Surgical History  Procedure Laterality Date  . Transurethral resection of prostate    . Cystectomy    . Tonsillectomy    . Colonoscopy  03/19/2010    Dr. Gala Romney -(poor prep) Anal papilla, rectal hyperplastic polyp, tubular adenoma removed splenic flexure, left-sided diverticula  . Wedge resection  04/2011    carcinoma of lung  . Colonoscopy  11/28/2004    RMR:  Diminutive rectal and left colon polyps as described above, cold  biopsied/removed/  Left sided diverticula. The remainder of the colonic mucosa appeared normal.  . Colonoscopy   09/15/01    RMR:  Multiple diminutive polyps destroyed with dermolysis as described above/ Multiple small polyps on stalks in the colon resected with snare cautery/ Scattered pan colonic diverticulum/ The remainder of the colonic mucosa appeared normal  . Abdominal aortic aneurysm repair  2004  . Cardiac catheterization    . Lung lobectomy      Denies any headaches, dizziness, double vision, fevers, chills, night sweats, nausea, vomiting, diarrhea, constipation, chest pain, heart palpitations, shortness of breath, blood in stool, black tarry stool, urinary pain, urinary burning, urinary frequency, hematuria.   PHYSICAL EXAMINATION  ECOG PERFORMANCE STATUS: 1 - Symptomatic but completely ambulatory  Filed Vitals:   06/20/13 1000  BP: 119/72  Pulse: 61  Temp: 96.5 F (35.8 C)  Resp: 20    GENERAL:alert, no distress, well nourished, well developed, comfortable, cooperative, obese and smiling SKIN: skin color, texture, turgor are normal, no rashes or significant lesions HEAD: Normocephalic, No masses, lesions, tenderness or abnormalities EYES: normal, PERRLA, EOMI, Conjunctiva are pink and non-injected EARS: External ears normal OROPHARYNX:mucous membranes are moist  NECK: supple, no adenopathy, thyroid normal size, non-tender, without nodularity, no stridor, non-tender, trachea midline LYMPH:  no palpable lymphadenopathy, no hepatosplenomegaly  BREAST:not examined LUNGS: clear to auscultation and percussion HEART: regular rate & rhythm, no murmurs, no gallops, S1 normal and S2 normal ABDOMEN:abdomen soft, non-tender, obese, normal bowel sounds, no masses or organomegaly and no hepatosplenomegaly BACK: Back symmetric, no curvature., No CVA tenderness EXTREMITIES:less then 2 second capillary refill, no joint deformities, effusion, or inflammation, no skin discoloration  NEURO: alert & oriented x 3 with fluent speech, no focal motor/sensory deficits, gait normal    LABORATORY DATA:  CBC      Component Value Date/Time   WBC 8.7 06/03/2013 1651   RBC 4.69 06/03/2013 1651   HGB 14.6 06/03/2013 1651   HCT 43.8 06/03/2013 1651   PLT 165.0 06/03/2013 1651   MCV 93.4 06/03/2013 1651   MCH 30.1 03/04/2013 1003   MCHC 33.4 06/03/2013 1651   RDW 16.9* 06/03/2013 1651   LYMPHSABS 3.2 06/03/2013 1651   MONOABS 0.7 06/03/2013 1651   EOSABS 0.3 06/03/2013 1651   BASOSABS 0.0 06/03/2013 1651      Chemistry      Component Value Date/Time   NA 139 03/04/2013 1003   K 4.5 03/04/2013 1003   CL 103 03/04/2013 1003   CO2 27 03/04/2013 1003   BUN 21 03/04/2013 1003   CREATININE 2.30* 06/15/2013 0911   CREATININE 1.88* 11/13/2011 1049      Component Value Date/Time   CALCIUM 9.6 03/04/2013 1003   ALKPHOS 50 06/10/2013 0952   AST 18 06/10/2013 0952   ALT 15 06/10/2013 0952   BILITOT 0.8 06/10/2013 0952       RADIOGRAPHIC STUDIES:  06/15/2013  CLINICAL DATA: Lung cancer. Adenopathy.  EXAM:  CT CHEST WITHOUT CONTRAST  TECHNIQUE:  Multidetector CT imaging of the chest was performed following the  standard protocol without IV contrast.  COMPARISON: PET-CT 03/16/2013. Chest CT 03/07/2013.  FINDINGS:  Shotty paratracheal lymph nodes again noted unchanged. Sub- carinal  and as azxygoesophageal small lymph nodes are present and are stable  in size and appearance. No new mediastinal or hilar lymph nodes are  noted.  Coronary artery disease. Heart size normal. No pericardial effusion.  Trachea and mainstem bronchi are patent. Soft tissue density noted  in segmental bronchi in a right lower lobe, this could represent  mucous plugging or tumor. Ill-defined density in the right lower  lobe i in the region of patient's surgery is less prominent than on  prior CT obtained for PET-CT on 03/16/2013. These changes could be  related to scarring. Underlying tumor cannot be completely excluded  and continued follow-up CTs can be obtained. Stable basilar pleural  thickening is present. Stable  subpleural 2 mm nodule image 32/series  3 right mid lobe. No new pulmonary nodules to suggest metastatic  disease  Stable approximate 2.7 cm right adrenal lesion with low density,  most likely stable adenoma. Gallstones. Stable 4.2 cm abdominal  aortic aneurysm. This involves renal arteries. .  Thyroid, supraclavicular, and axillary regions are unremarkable.  Midline abdominal hernia with herniation of colon again noted. No  strangulation. Degenerative changes thoracic spine. No focal bony  lesion identified.  IMPRESSION:  1. Ill-defined pulmonary dense in the right lower lobe in the region  of patient's prior surgery, this is less prominent than on prior CT  obtained for PET-CT of 02/25/2013. This is most likely region of  scarring. Underlying tumor cannot be excluded and continued  follow-up with PET-CT suggest.  2. Stable mediastinal adenopathy. No evidence of progression.  3. Stable 2 mm pulmonary nodule right middle lobe.  4. Stable 2.7 cm right adrenal lesion with low density, most likely  stable adenoma but continued follow-up needed. This can be followed  with PET-CT.  5. Stable 4.2 cm abdominal aortic aneurysm with involvement of renal  arteries.  6. Gallstones.  7. Stable midline abdominal hernia.  Electronically Signed  By: Marcello Moores Register  On: 06/15/2013 10:11    ASSESSMENT:  1. Stage I a (T1 B., N0, M0) moderately differentiated adenocarcinoma the lung, 2.3 cm in size with surgery on 04/03/2011. Margins were clear, no LV I was seen, and there is no pleural involvement. 6 lymph nodes were all negative getting stage IA disease. Recent CT scan findings will require close interval follow-up CT scan. 2. Thrombocytopenia thus far consistent with chronic ITP of adults versus drug-induced mild thrombocytopenia. He is on too many medications to withdraw them completely. There is no evidence on his May 2013 CT scan of splenomegaly or cirrhosis. Stable.  3. Obesity weighing 269  pounds and 6 feet tall  4. Intermittent diarrhea treated by GI  5. Bladder cancer followed by Dr. Rosana Hoes  6. MI x2 the last in May 2012  7. Abdominal hernia secondary to abdominal aortic aneurysm surgery in 2008  8. Double vision in the past which has not been a recent issue  9. History of skin cancer  10. Chronic renal disease, grade 3, followed by nephrologist  11. Elevated glucose, followed by Dr. Wolfgang Phoenix.  Patient Active Problem List   Diagnosis Date Noted  . Essential and other specified forms of tremor 02/23/2013  . Diplopia 02/23/2013  . Polyneuropathy in diabetes(357.2) 02/23/2013  . Abnormality of gait 02/23/2013  . COPD exacerbation 01/31/2013  . Fracture of right distal radius 12/14/2012  . Type II or unspecified type diabetes mellitus with neurological manifestations, not stated as uncontrolled(250.60) 11/30/2012  . ITP (idiopathic thrombocytopenic purpura) 09/07/2012  . LLQ pain 11/13/2011  . Ventral hernia 10/28/2011  . AAA (abdominal aortic aneurysm)   . Tobacco abuse   . Diarrhea 10/22/2011  . Tubular adenoma of colon   . Obstructive sleep apnea 03/27/2011  . Fasting hyperglycemia 03/27/2011  . Nephrolithiasis 03/27/2011  . Bladder carcinoma 03/27/2011  . COPD (chronic obstructive pulmonary disease) 02/24/2011  . Adenocarcinoma of right lung 02/24/2011  . Arteriosclerotic cardiovascular disease (ASCVD) 01/06/2011  . Hypertension 01/06/2011  . CKD (chronic kidney disease) stage 3, GFR 30-59 ml/min 01/06/2011    PLAN:  1. I personally reviewed and went over laboratory results with the patient. 2. I personally reviewed and went over radiographic studies with the patient. 3. Influenza vaccine already given. 4. Patient education regarding NCCN guidelines 5. CT Chest without contrast in 3 months 6. Labs in 3 months: CBC diff, CMET 7. Return in 3 months for follow-up   THERAPY PLAN:  Due to Korin's CT scan most recently, he will require a close interval CT of  chest in 3 months.  NCCN guidelines for Non-Small Cell Lung Cancer Surveillance are as follows:  A. H+P every 6-12 months  B. CT chest every 6-12 months x 2 years and then annually   C. PET scan not typically indicated for surveillance   All questions were answered. The patient knows to call the clinic with any problems, questions or concerns. We can certainly see the patient much sooner if necessary.  Patient and plan discussed with Dr. Farrel Gobble and he is in agreement with the aforementioned.   Karisma Meiser

## 2013-06-20 ENCOUNTER — Encounter (HOSPITAL_COMMUNITY): Payer: Self-pay | Admitting: Oncology

## 2013-06-20 ENCOUNTER — Encounter (HOSPITAL_COMMUNITY): Payer: Medicare Other | Attending: Oncology | Admitting: Oncology

## 2013-06-20 VITALS — BP 119/72 | HR 61 | Temp 96.5°F | Resp 20 | Wt 269.2 lb

## 2013-06-20 DIAGNOSIS — C3491 Malignant neoplasm of unspecified part of right bronchus or lung: Secondary | ICD-10-CM

## 2013-06-20 DIAGNOSIS — D696 Thrombocytopenia, unspecified: Secondary | ICD-10-CM

## 2013-06-20 DIAGNOSIS — D693 Immune thrombocytopenic purpura: Secondary | ICD-10-CM

## 2013-06-20 DIAGNOSIS — C3430 Malignant neoplasm of lower lobe, unspecified bronchus or lung: Secondary | ICD-10-CM

## 2013-06-20 NOTE — Patient Instructions (Signed)
[facility] Discharge Instructions  RECOMMENDATIONS MADE BY THE CONSULTANT AND ANY TEST RESULTS WILL BE SENT TO YOUR REFERRING PHYSICIAN.  EXAM FINDINGS BY THE PHYSICIAN TODAY AND SIGNS OR SYMPTOMS TO REPORT TO CLINIC OR PRIMARY PHYSICIAN: Exam and findings as discussed by Robynn Pane, PA-C.  Your are doing well.  Will recheck labs and repeat scans in 3 months.  MEDICATIONS PRESCRIBED:  none  INSTRUCTIONS/FOLLOW-UP: Follow-up in 3 months.  Thank you for choosing [facility] to provide your oncology and hematology care.  To afford each patient quality time with our providers, please arrive at least 15 minutes before your scheduled appointment time.  With your help, our goal is to use those 15 minutes to complete the necessary work-up to ensure our physicians have the information they need to help with your evaluation and healthcare recommendations.    Effective January 1st, 2014, we ask that you re-schedule your appointment with our physicians should you arrive 10 or more minutes late for your appointment.  We strive to give you quality time with our providers, and arriving late affects you and other patients whose appointments are after yours.    Again, thank you for choosing [facility].  Our hope is that these requests will decrease the amount of time that you wait before being seen by our physicians.       _____________________________________________________________  Should you have questions after your visit to [facility], please contact our office at [phone] between the hours of 8:30 a.m. and 5:00 p.m.  Voicemails left after 4:30 p.m. will not be returned until the following business day.  For prescription refill requests, have your pharmacy contact our office with your prescription refill request.     3

## 2013-06-21 ENCOUNTER — Telehealth: Payer: Self-pay | Admitting: *Deleted

## 2013-06-21 NOTE — Telephone Encounter (Signed)
When he finishes the Flagyl, if he is well, the I don't have to see him. But if his bowls still bother him, I will see him  After New year. His biopsies were negative for microscopic colitis

## 2013-06-21 NOTE — Telephone Encounter (Signed)
Patient wants to know if he needs f/u OV with Dr. Olevia Perches. Please, advise

## 2013-06-21 NOTE — Telephone Encounter (Signed)
Left a message for patient to call me. 

## 2013-06-22 NOTE — Telephone Encounter (Signed)
Spoke with patient and he states he is still having constipation alternating with loose stool and urgency. He has completed his Flagyl. Scheduled OV on 07/27/13 at 8:45 AM.

## 2013-06-27 ENCOUNTER — Telehealth: Payer: Self-pay | Admitting: Pulmonary Disease

## 2013-06-27 ENCOUNTER — Encounter: Payer: Self-pay | Admitting: *Deleted

## 2013-06-27 MED ORDER — TIOTROPIUM BROMIDE MONOHYDRATE 18 MCG IN CAPS: 18.0000 ug | ORAL_CAPSULE | Freq: Every day | RESPIRATORY_TRACT | Status: DC

## 2013-06-27 NOTE — Telephone Encounter (Signed)
Ok to give him one box, and see if he has applied to pt assistance program thru the drug company.

## 2013-06-27 NOTE — Telephone Encounter (Signed)
I called and spoke with pt. He reports he is in the donut hole and it will cost him over $400 for his spiriva. He is asking for sample. Looked in pt chart and do not see where he has for samples. Please advise [place] thanks

## 2013-06-27 NOTE — Telephone Encounter (Signed)
Called, spoke with pt -  He is aware I have placed spiriva sample at front for pick up along with pt assistance application for Spiriva.  He is not sure he will proceed with the pt assistance program, but he will look over the application.  He was very thankful and voiced no further questions or concerns at this time.

## 2013-07-18 ENCOUNTER — Telehealth: Payer: Self-pay | Admitting: Family Medicine

## 2013-07-18 MED ORDER — GLIPIZIDE ER 2.5 MG PO TB24: 2.5000 mg | ORAL_TABLET | Freq: Every day | ORAL | Status: DC

## 2013-07-18 NOTE — Telephone Encounter (Signed)
Pt needs 30 day supply of Glipizide called into [facility]/, has appt here 08/04/13, please call pt when done [phone]

## 2013-07-18 NOTE — Telephone Encounter (Signed)
Medication was sent to pharmacy in [name]. Left message on voicemail notifying patient.

## 2013-07-27 ENCOUNTER — Ambulatory Visit (INDEPENDENT_AMBULATORY_CARE_PROVIDER_SITE_OTHER): Payer: Medicare Other | Admitting: Internal Medicine

## 2013-07-27 ENCOUNTER — Encounter: Payer: Self-pay | Admitting: Internal Medicine

## 2013-07-27 VITALS — BP 136/72 | HR 64 | Ht 72.0 in | Wt 267.0 lb

## 2013-07-27 DIAGNOSIS — A0472 Enterocolitis due to Clostridium difficile, not specified as recurrent: Secondary | ICD-10-CM

## 2013-07-27 DIAGNOSIS — R197 Diarrhea, unspecified: Secondary | ICD-10-CM

## 2013-07-27 MED ORDER — METRONIDAZOLE 250 MG PO TABS: 250.0000 mg | ORAL_TABLET | Freq: Three times a day (TID) | ORAL | Status: DC

## 2013-07-27 NOTE — Patient Instructions (Signed)
Your physician has requested that you go to the basement for the following lab work before leaving today: C diff by pcr  We have sent the following medications to your pharmacy for you to pick up at your convenience: Flagyl 250 Take 1 tablet by mouth three times daily x 10 days  CC: Dr Baltazar Apo

## 2013-07-27 NOTE — Progress Notes (Signed)
Kyle [name] [birth year] [id number]   History of Present Illness:  This is a 80 year old white male with intermittent diarrhea attributed to irritable bowel syndrome. He was fully evaluated by Dr. Gala Romney in the past and we saw him in November 2014. His exam showed Hemoccult negative stool. He had C. difficile colitis in August 2014 which responded to Flagyl. He was given another treatment of Flagyl 253 times a day for 10 days with improvement. He still has intermittent diarrhea and his wife reports similar odor to the stool suggestive of recurrent C. difficile. His flexible sigmoidoscopy showed a tubular adenoma but biopsies were negative for ischemic colitis. He has been taking iron which results in dark black stools. His sedimentation rate has been mildly elevated at 23 but his sprue profile was negative except for mild elevation of antigliadin IgG of 31.4.    Past Medical History  Diagnosis Date  . Arteriosclerotic cardiovascular disease (ASCVD) 1973, 12/2010    S/P NSTEMI secondary to distal RCA/PL lesion, tx medically.  EF of  55%-60% per  echo.  . Diabetes mellitus     Type II  . Thrombocytopenia   . COPD (chronic obstructive pulmonary disease)   . AAA (abdominal aortic aneurysm) 2004    s/p repair 2004; 4.3 cm infrarenal in 05/2011  . Tobacco abuse     50-pack-year consumption; quit in 12/2010  . Hyperlipidemia   . OSA (obstructive sleep apnea)   . Hypertension   . Benign prostatic hypertrophy     s/p transurethral resection of the prostate  . Obesity   . Tubular adenoma of colon   . Cataract   . Ventral hernia   . Diverticulosis   . Bilateral renal masses     Cystic, more prominent on CT in 12/2010 than 2007; followed by Dr. Rosana Hoes  . ITP (idiopathic thrombocytopenic purpura) 09/07/2012    Chronic ITP of adults versus medication-induced ITP.  Stable  . Insomnia   . Bladder cancer 1996    Transurethral resection of the bladder + chemotherapy/BCG as premed  . Adenocarcinoma,  lung 01/2011    transthoracic FNA; resection of the superior segment of the RLL in 03/2011; negative nodes; no chemotherapy nor radiation planned  . Chronic kidney disease     Creatinine 1.4 on discharge 12/20/2100; proteinuria; normal renal ultrasound in 2010; recent creatinines of 1.7-2.; Bilateral cystic renal masses by CT in 2011  . Nephrolithiasis 2012    ARF in 01/2011 due to obstructing nephrolithiasis  . Arm fracture     right arm  . Essential and other specified forms of tremor 02/23/2013  . Diplopia 02/23/2013  . Polyneuropathy in diabetes(357.2) 02/23/2013  . Abnormality of gait 02/23/2013  . Hx of Clostridium difficile infection   . Adenocarcinoma of right lung 02/24/2011    Ct A/P 2012:  2cm lung mass RLL PET 2094:  Hypermetabolic RLL mass, no other hypermetabolic areas. TTNA 02/2011:  Adenocarcinoma, markers c/w lung origin Right lower lobe superior segmentectomy. 04/01/2011 Dr. Arlyce Dice     Past Surgical History  Procedure Laterality Date  . Transurethral resection of prostate    . Cystectomy    . Tonsillectomy    . Colonoscopy  03/19/2010    Dr. Gala Romney -(poor prep) Anal papilla, rectal hyperplastic polyp, tubular adenoma removed splenic flexure, left-sided diverticula  . Wedge resection  04/2011    carcinoma of lung  . Colonoscopy  11/28/2004    RMR:  Diminutive rectal and left colon polyps as described above, cold  biopsied/removed/  Left sided diverticula. The remainder of the colonic mucosa appeared normal.  . Colonoscopy   09/15/01    RMR: Multiple diminutive polyps destroyed with dermolysis as described above/ Multiple small polyps on stalks in the colon resected with snare cautery/ Scattered pan colonic diverticulum/ The remainder of the colonic mucosa appeared normal  . Abdominal aortic aneurysm repair  2004  . Cardiac catheterization    . Lung lobectomy      Allergies  Allergen Reactions  . Codeine Anaphylaxis  . Etodolac     dizziness    Family history and social history  have been reviewed.  Review of Systems: Intermittent diarrhea improved since last appointment  The remainder of the 10 point ROS is negative except as outlined in the H&P  Physical Exam: General Appearance Well developed, in no distress, obese Psychological Normal mood and affect  Assessment and Plan:   Problem #44 80 year old white male with recent pseudomembranous colitis and irritable bowel syndrome. He has been improved but he reports return of malodorous stools suggestive of recurrent C. difficile. We will start him again on Flagyl 250 mg 3 times a day for 10 days with one refill and obtain stool samples for C. difficile by PCR. He will call us back with an update.    Delfin Edis 07/27/2013

## 2013-07-28 ENCOUNTER — Other Ambulatory Visit: Payer: Medicare Other

## 2013-07-28 DIAGNOSIS — R197 Diarrhea, unspecified: Secondary | ICD-10-CM

## 2013-08-01 LAB — CLOSTRIDIUM DIFFICILE BY PCR: Toxigenic C. Difficile by PCR: NOT DETECTED

## 2013-08-04 ENCOUNTER — Encounter: Payer: Self-pay | Admitting: Family Medicine

## 2013-08-04 ENCOUNTER — Ambulatory Visit (INDEPENDENT_AMBULATORY_CARE_PROVIDER_SITE_OTHER): Payer: Medicare Other | Admitting: Family Medicine

## 2013-08-04 VITALS — BP 130/84 | Ht 72.0 in | Wt 274.0 lb

## 2013-08-04 DIAGNOSIS — I10 Essential (primary) hypertension: Secondary | ICD-10-CM

## 2013-08-04 DIAGNOSIS — E1149 Type 2 diabetes mellitus with other diabetic neurological complication: Secondary | ICD-10-CM

## 2013-08-04 DIAGNOSIS — G250 Essential tremor: Secondary | ICD-10-CM

## 2013-08-04 DIAGNOSIS — G252 Other specified forms of tremor: Secondary | ICD-10-CM

## 2013-08-04 DIAGNOSIS — R197 Diarrhea, unspecified: Secondary | ICD-10-CM

## 2013-08-04 DIAGNOSIS — J449 Chronic obstructive pulmonary disease, unspecified: Secondary | ICD-10-CM

## 2013-08-04 MED ORDER — GLIPIZIDE ER 2.5 MG PO TB24: 2.5000 mg | ORAL_TABLET | Freq: Every day | ORAL | Status: DC

## 2013-08-04 MED ORDER — FLUTICASONE PROPIONATE 50 MCG/ACT NA SUSP: 2.0000 | Freq: Every day | NASAL | Status: DC

## 2013-08-04 NOTE — Progress Notes (Signed)
   Subjective:    Patient ID: Kyle [name], male    DOB: 1934-06-27, 80 y.o.   MRN: [id number]  HPI Patient arrives for a follow up on cholesterol. Last HgbA1c 5.1 on November 21. Patient reports no problems or concerns.  Morning sugars 136 at [facility].  Morning numbers at home unreliable. Often getting numbers near 300. Feels machine may no longer be working at this point.  Compliant with her pressure medicine. Blood pressures get elsewhere. Tried watch salt intake. No obvious side effects.  Shortness of breath with exertion. No chest pain. No true wheezing at this time.  Not exercising much at this time Stumbled on diet over holidays  Low back start to give out every evening, aches at times. Primarily left lumbar region. Worse towards the evening. Patient assumed it was kidney. Has a kidney specialist visits scheduled soon.  Review of Systems Gradual weight gain, some fatigue, some chronic back pain. No chest pain no excessive shortness breath no change in bowel habits ROS otherwise negative    Objective:   Physical Exam  Alert substantial obesity present. HEENT sinus congestion. Lungs diminished breath sounds no wheezes or crackles heart regular in rhythm. Ankles trace edema. Diabetic foot exam see last visit.  No spinal tenderness positive left lumbar tenderness to deep palpation    Assessment & Plan:  Impression #1 hypertension controlled good discuss. #2 type 2 diabetes difficulty with machine discussed. Prescribed a machine. Will maintain same since our numbers are looking so much better. #3 COPD clinically stable. Exercise encourage warning signs discussed. #4 hyperlipidemia #5 renal insufficiency #6 history of lung cancer plan meds #7 back pain likely musculoskeletal discussed refilled. Diet discussed in encourage. Recheck in several months. Followup with specialist as scheduled.

## 2013-08-05 ENCOUNTER — Telehealth: Payer: Self-pay | Admitting: Neurology

## 2013-08-05 NOTE — Telephone Encounter (Signed)
I received a fax from Heloise Ochoa with [facility] in regards to patient having to keep follow up since he was seen by Dr. Sanda Klein at [facility] for his diplopia.  I spoke to [facility] and relayed that we are seeing the patient for tremor and gait disorder, so he needs to keep his appointment with Hoyle Sauer on 08-26-13.  She will let the patient know.

## 2013-08-22 DIAGNOSIS — D090 Carcinoma in situ of bladder: Secondary | ICD-10-CM | POA: Insufficient documentation

## 2013-08-25 ENCOUNTER — Ambulatory Visit (INDEPENDENT_AMBULATORY_CARE_PROVIDER_SITE_OTHER): Payer: Medicare Other | Admitting: Adult Health

## 2013-08-25 ENCOUNTER — Encounter: Payer: Self-pay | Admitting: Adult Health

## 2013-08-25 VITALS — BP 130/66 | HR 66 | Ht 72.0 in | Wt 269.0 lb

## 2013-08-25 DIAGNOSIS — I714 Abdominal aortic aneurysm, without rupture, unspecified: Secondary | ICD-10-CM

## 2013-08-25 DIAGNOSIS — I10 Essential (primary) hypertension: Secondary | ICD-10-CM

## 2013-08-25 DIAGNOSIS — I2510 Atherosclerotic heart disease of native coronary artery without angina pectoris: Secondary | ICD-10-CM

## 2013-08-25 DIAGNOSIS — I7090 Unspecified atherosclerosis: Secondary | ICD-10-CM

## 2013-08-25 NOTE — Assessment & Plan Note (Signed)
He is currently asymptomatic from cardiac standpoint. He remains medically compliant. He has not had used nitroglycerin. He will continue medical management and risk management and will remain on statin and beta blocker. Will see him again in one year unless he becomes symptomatic or is in need of surgical clearance in the setting of recently diagnosed recurrence of bladder cancer.

## 2013-08-25 NOTE — Progress Notes (Deleted)
Name: Kyle [name]    DOB: 12-19-1933  Age: 80 y.o.  MR#: [id number]       PCP:  [name], MD      Insurance: Payor: [employer] MEDICARE / Plan: AARP MEDICARE COMPLETE / Product Type: *No Product type* /   CC:    Chief Complaint  Patient presents with  . Hypertension  . Coronary Artery Disease    VS Filed Vitals:   08/25/13 1428  BP: 130/66  Pulse: 66  Height: 6' (1.829 m)  Weight: 269 lb (122.018 kg)    Weights Current Weight  08/25/13 269 lb (122.018 kg)  08/04/13 274 lb (124.286 kg)  07/27/13 267 lb (121.11 kg)    Blood Pressure  BP Readings from Last 3 Encounters:  08/25/13 130/66  08/04/13 130/84  07/27/13 136/72     Admit date:  (Not on file) Last encounter with RMR:  Visit date not found   Allergy Codeine and Etodolac  Current Outpatient Prescriptions  Medication Sig Dispense Refill  . ACCU-CHEK AVIVA PLUS test strip       . ACCU-CHEK SOFTCLIX LANCETS lancets       . acetaminophen (TYLENOL) 500 MG tablet Take 500 mg by mouth every 6 (six) hours as needed.        Marland Kitchen albuterol (PROAIR HFA) 108 (90 BASE) MCG/ACT inhaler Inhale 2 puffs into the lungs every 6 (six) hours as needed for wheezing.  1 Inhaler  1  . ALPRAZolam (XANAX) 0.5 MG tablet Take 0.5 mg by mouth 2 (two) times daily as needed.  60 tablet  5  . aspirin EC 81 MG tablet 81 mg. Take 81 mg by mouth daily.      . Blood Glucose Monitoring Suppl (ACCU-CHEK AVIVA PLUS) W/DEVICE KIT       . cetirizine (ZYRTEC) 10 MG tablet Take 10 mg by mouth daily.       . ciprofloxacin (CIPRO) 500 MG tablet Take 500 mg by mouth 2 (two) times daily.       . citalopram (CELEXA) 20 MG tablet TAKE ONE TABLET BY MOUTH ONCE DAILY  90 tablet  1  . fluticasone (FLONASE) 50 MCG/ACT nasal spray Place 2 sprays into both nostrils daily.  16 g  5  . glipiZIDE (GLUCOTROL XL) 2.5 MG 24 hr tablet Take 1 tablet (2.5 mg total) by mouth daily with breakfast.  30 tablet  5  . lisinopril (PRINIVIL,ZESTRIL) 2.5 MG tablet Take 2.5 mg  by mouth daily.      . meclizine (ANTIVERT) 25 MG tablet Take 25 mg by mouth as needed.      . metoprolol (LOPRESSOR) 50 MG tablet Take 1 tablet (50 mg total) by mouth 2 (two) times daily.  180 tablet  1  . niacin (NIASPAN) 500 MG CR tablet TAKE TWO TABLETS BY MOUTH ONCE DAILY AT BEDTIME  180 tablet  1  . nitroGLYCERIN (NITROSTAT) 0.4 MG SL tablet Place 1 tablet (0.4 mg total) under the tongue as needed. Place 0.4 mg under the tongue every 5 (five) minutes as needed. Call MD if need more than 2  3 tablet  5  . pravastatin (PRAVACHOL) 80 MG tablet TAKE ONE TABLET BY MOUTH ONCE DAILY  90 tablet  1  . tiotropium (SPIRIVA) 18 MCG inhalation capsule Place 1 capsule (18 mcg total) into inhaler and inhale daily.  10 capsule  0  . Vitamin D, Ergocalciferol, (DRISDOL) 50000 UNITS CAPS capsule TAKE ONE CAPSULE BY MOUTH MONTHLY  3 capsule  1  . ferrous sulfate (FERROUSUL) 325 (65 FE) MG tablet Take 325 mg by mouth daily with breakfast.        . [DISCONTINUED] budesonide-formoterol (SYMBICORT) 160-4.5 MCG/ACT inhaler Inhale 2 puffs into the lungs 2 (two) times daily.         No current facility-administered medications for this visit.    Discontinued Meds:    Medications Discontinued During This Encounter  Medication Reason  . metroNIDAZOLE (FLAGYL) 250 MG tablet Error    Patient Active Problem List   Diagnosis Date Noted  . Essential and other specified forms of tremor 02/23/2013  . Diplopia 02/23/2013  . Polyneuropathy in diabetes(357.2) 02/23/2013  . Abnormality of gait 02/23/2013  . COPD exacerbation 01/31/2013  . Fracture of right distal radius 12/14/2012  . Type II or unspecified type diabetes mellitus with neurological manifestations, not stated as uncontrolled(250.60) 11/30/2012  . ITP (idiopathic thrombocytopenic purpura) 09/07/2012  . LLQ pain 11/13/2011  . Ventral hernia 10/28/2011  . AAA (abdominal aortic aneurysm)   . Tobacco abuse   . Diarrhea 10/22/2011  . Tubular adenoma of  colon   . Obstructive sleep apnea 03/27/2011  . Fasting hyperglycemia 03/27/2011  . Nephrolithiasis 03/27/2011  . Bladder carcinoma 03/27/2011  . COPD (chronic obstructive pulmonary disease) 02/24/2011  . Adenocarcinoma of right lung 02/24/2011  . Arteriosclerotic cardiovascular disease (ASCVD) 01/06/2011  . Hypertension 01/06/2011  . CKD (chronic kidney disease) stage 3, GFR 30-59 ml/min 01/06/2011    LABS    Component Value Date/Time   NA 139 03/04/2013 1003   NA 138 02/01/2013 0545   NA 141 01/31/2013 1447   K 4.5 03/04/2013 1003   K 4.3 02/01/2013 0545   K 4.2 01/31/2013 1447   CL 103 03/04/2013 1003   CL 104 02/01/2013 0545   CL 106 01/31/2013 1447   CO2 27 03/04/2013 1003   CO2 24 02/01/2013 0545   CO2 23 01/31/2013 1447   GLUCOSE 93 03/04/2013 1003   GLUCOSE 480* 02/01/2013 1205   GLUCOSE 291* 02/01/2013 0545   BUN 21 03/04/2013 1003   BUN 39* 02/01/2013 0545   BUN 40* 01/31/2013 1447   CREATININE 2.30* 06/15/2013 0911   CREATININE 1.64* 03/04/2013 1003   CREATININE 2.11* 02/01/2013 0545   CREATININE 1.88* 11/13/2011 1049   CREATININE 1.72* 05/16/2011 1050   CALCIUM 9.6 03/04/2013 1003   CALCIUM 9.3 02/01/2013 0545   CALCIUM 9.7 01/31/2013 1447   GFRNONAA 39* 03/04/2013 1003   GFRNONAA 29* 02/01/2013 0545   GFRNONAA 26* 01/31/2013 1447   GFRAA 45* 03/04/2013 1003   GFRAA 33* 02/01/2013 0545   GFRAA 30* 01/31/2013 1447   CMP     Component Value Date/Time   NA 139 03/04/2013 1003   K 4.5 03/04/2013 1003   CL 103 03/04/2013 1003   CO2 27 03/04/2013 1003   GLUCOSE 93 03/04/2013 1003   BUN 21 03/04/2013 1003   CREATININE 2.30* 06/15/2013 0911   CREATININE 1.88* 11/13/2011 1049   CALCIUM 9.6 03/04/2013 1003   PROT 6.3 06/10/2013 0952   ALBUMIN 4.0 06/10/2013 0952   AST 18 06/10/2013 0952   ALT 15 06/10/2013 0952   ALKPHOS 50 06/10/2013 0952   BILITOT 0.8 06/10/2013 0952   GFRNONAA 39* 03/04/2013 1003   GFRAA 45* 03/04/2013 1003       Component Value Date/Time   WBC 8.7 06/03/2013  1651   WBC 5.9 03/04/2013 1003   WBC 7.2 02/01/2013 0545   HGB 14.6 06/03/2013 1651   HGB  13.4 03/04/2013 1003   HGB 13.1 02/01/2013 0545   HCT 43.8 06/03/2013 1651   HCT 42.2 03/04/2013 1003   HCT 39.2 02/01/2013 0545   MCV 93.4 06/03/2013 1651   MCV 94.8 03/04/2013 1003   MCV 94.0 02/01/2013 0545    Lipid Panel     Component Value Date/Time   CHOL 130 06/10/2013 0952   TRIG 154* 06/10/2013 0952   HDL 26* 06/10/2013 0952   CHOLHDL 5.0 06/10/2013 0952   VLDL 31 06/10/2013 0952   LDLCALC 73 06/10/2013 0952    ABG    Component Value Date/Time   PHART 7.307* 04/04/2011 0435   PCO2ART 50.0* 04/04/2011 0435   PO2ART 58.0* 04/04/2011 0435   HCO3 25.1* 04/04/2011 0435   TCO2 27 04/04/2011 0435   ACIDBASEDEF 2.0 04/04/2011 0435   O2SAT 87.0 04/04/2011 0435     Lab Results  Component Value Date   TSH 3.506 01/31/2013   BNP (last 3 results) No results found for this basename: PROBNP,  in the last 8760 hours Cardiac Panel (last 3 results) No results found for this basename: CKTOTAL, CKMB, TROPONINI, RELINDX,  in the last 72 hours  Iron/TIBC/Ferritin    Component Value Date/Time   IRON 51 06/03/2013 1651   TIBC 247 05/26/2011 1624   FERRITIN 282 05/26/2011 1624     EKG Orders placed during the hospital encounter of 01/31/13  . ED EKG  . ED EKG  . EKG 12-LEAD  . EKG 12-LEAD  . EKG     Prior Assessment and Plan Problem List as of 08/25/2013   Essential and other specified forms of tremor   Diplopia   Polyneuropathy in diabetes(357.2)   Abnormality of gait   Arteriosclerotic cardiovascular disease (ASCVD)   Last Assessment & Plan   08/24/2012 Office Visit Written 08/24/2012  3:55 PM by [name], NP     He is doing well without cardiac complaints. Had NSTEMI 2 years ago and continues on risk factor management. Is due to have labs drawn by [facility] next week. Will request copies. Continue current medical management. See again in 6 months.    Hypertension   Last Assessment &  Plan   08/24/2012 Office Visit Written 08/24/2012  3:55 PM by [name], NP     Excellent control of blood pressure on this visit. No changes in management.     CKD (chronic kidney disease) stage 3, GFR 30-59 ml/min   Last Assessment & Plan   08/24/2012 Office Visit Written 08/24/2012  3:57 PM by [name], NP     Followed by nephrology and has had recent ureter stents placed after biopsy in January of 2014.     COPD (chronic obstructive pulmonary disease)   Last Assessment & Plan   02/28/2013 Office Visit Written 02/28/2013  9:28 AM by Kathee Delton, MD     The patient has had a recent acute exacerbation that I suspect is secondary to acute bronchitis.  He now feels that he is back to baseline, and has no bronchospasm on exam.  I would like to continue him on his current bronchodilator regimen, but may have to add additional medications if he has another acute exacerbation in a short period of time.  I stressed to him the importance of weight loss and conditioning.    Adenocarcinoma of right lung   Last Assessment & Plan   10/28/2011 Office Visit Written 10/28/2011 11:11 PM by Yehuda Savannah, MD  Patient has recovered completely following surgery.  Ongoing surveillance will be via the chest clinic at [facility].    Obstructive sleep apnea   Fasting hyperglycemia   Nephrolithiasis   Bladder carcinoma   Last Assessment & Plan   10/28/2011 Office Visit Written 10/28/2011 11:28 PM by Yehuda Savannah, MD     Bilateral renal cystic masses by CT scan in 12/2010, progressive when compared to a previous study performed in 2007-Followed by Dr. Rosana Hoes     Diarrhea   Last Assessment & Plan   06/23/2012 Office Visit Written 06/24/2012  1:54 PM by Orvil Feil, NP     Hx of diarrhea with negative stool studies, improved in past with off-label Questran. He has weaned off Questran in interim from last visit; however, notes recurrence of fecal incontinence X 2 in the presence of abx. Doubt  dealing with Cdiff, but with his long hx of diarrhea, will recheck. Hold off on institution of [place]. Will add back supplemental fiber daily. Return in 3 mos or sooner if needed. Hopefully, he will do well with simply fiber addition. If diarrhea returns, consider TCS with segmental biopsies to assess for microscopic colitis.     Tubular adenoma of colon   Ventral hernia   AAA (abdominal aortic aneurysm)   Last Assessment & Plan   08/24/2012 Office Visit Written 08/24/2012  3:56 PM by [name], NP     He is followed by [facility]mar[facility] Plant for annual evaluation and management of with CT scan. Most recent was December of 2013.     Tobacco abuse   LLQ pain   ITP (idiopathic thrombocytopenic purpura)   Type II or unspecified type diabetes mellitus with neurological manifestations, not stated as uncontrolled(250.60)   Fracture of right distal radius   COPD exacerbation       Imaging: No results found.

## 2013-08-25 NOTE — Progress Notes (Signed)
HPI: [name] is a 80 year old patient to be est. with Dr. Pennelope [name], we are following for ongoing assessment and management of CAD with history of hypertension, AAA, and chronic kidney disease stage III with GFR 30-59 mL per minute. The patient was last seen in the office in 08/24/2012 and was planned to be seen again in one year.   Since being seen last, the patient has been diagnosed with recurrence of bladder cancer. He apparently had bladder cancer approximately 12 years ago, and also has a history of lung cancer. This is rediagnosed in January of 2015. He is followed by oncology on the fourth floor at [facility]. He is receiving chemotherapy.    The patient is without complaints of chest pain, dyspnea on exertion, or palpitations. Chemotherapy is provided via indwelling catheter on day of treatment. Otherwise, he remains medically compliant, and has had no cardiac complaints.     Allergies  Allergen Reactions  . Codeine Anaphylaxis  . Etodolac     dizziness    Current Outpatient Prescriptions  Medication Sig Dispense Refill  . ACCU-CHEK AVIVA PLUS test strip       . ACCU-CHEK SOFTCLIX LANCETS lancets       . acetaminophen (TYLENOL) 500 MG tablet Take 500 mg by mouth every 6 (six) hours as needed.        Marland Kitchen albuterol (PROAIR HFA) 108 (90 BASE) MCG/ACT inhaler Inhale 2 puffs into the lungs every 6 (six) hours as needed for wheezing.  1 Inhaler  1  . ALPRAZolam (XANAX) 0.5 MG tablet Take 0.5 mg by mouth 2 (two) times daily as needed.  60 tablet  5  . aspirin EC 81 MG tablet 81 mg. Take 81 mg by mouth daily.      . Blood Glucose Monitoring Suppl (ACCU-CHEK AVIVA PLUS) W/DEVICE KIT       . cetirizine (ZYRTEC) 10 MG tablet Take 10 mg by mouth daily.       . ciprofloxacin (CIPRO) 500 MG tablet Take 500 mg by mouth 2 (two) times daily.       . citalopram (CELEXA) 20 MG tablet TAKE ONE TABLET BY MOUTH ONCE DAILY  90 tablet  1  . fluticasone (FLONASE) 50 MCG/ACT nasal spray Place  2 sprays into both nostrils daily.  16 g  5  . glipiZIDE (GLUCOTROL XL) 2.5 MG 24 hr tablet Take 1 tablet (2.5 mg total) by mouth daily with breakfast.  30 tablet  5  . lisinopril (PRINIVIL,ZESTRIL) 2.5 MG tablet Take 2.5 mg by mouth daily.      . meclizine (ANTIVERT) 25 MG tablet Take 25 mg by mouth as needed.      . metoprolol (LOPRESSOR) 50 MG tablet Take 1 tablet (50 mg total) by mouth 2 (two) times daily.  180 tablet  1  . niacin (NIASPAN) 500 MG CR tablet TAKE TWO TABLETS BY MOUTH ONCE DAILY AT BEDTIME  180 tablet  1  . nitroGLYCERIN (NITROSTAT) 0.4 MG SL tablet Place 1 tablet (0.4 mg total) under the tongue as needed. Place 0.4 mg under the tongue every 5 (five) minutes as needed. Call MD if need more than 2  3 tablet  5  . pravastatin (PRAVACHOL) 80 MG tablet TAKE ONE TABLET BY MOUTH ONCE DAILY  90 tablet  1  . tiotropium (SPIRIVA) 18 MCG inhalation capsule Place 1 capsule (18 mcg total) into inhaler and inhale daily.  10 capsule  0  . Vitamin D, Ergocalciferol, (DRISDOL) 50000  UNITS CAPS capsule TAKE ONE CAPSULE BY MOUTH MONTHLY  3 capsule  1  . ferrous sulfate (FERROUSUL) 325 (65 FE) MG tablet Take 325 mg by mouth daily with breakfast.        . [DISCONTINUED] budesonide-formoterol (SYMBICORT) 160-4.5 MCG/ACT inhaler Inhale 2 puffs into the lungs 2 (two) times daily.         No current facility-administered medications for this visit.    Past Medical History  Diagnosis Date  . Arteriosclerotic cardiovascular disease (ASCVD) 1973, 12/2010    S/P NSTEMI secondary to distal RCA/PL lesion, tx medically.  EF of  55%-60% per  echo.  . Diabetes mellitus     Type II  . Thrombocytopenia   . COPD (chronic obstructive pulmonary disease)   . AAA (abdominal aortic aneurysm) 2004    s/p repair 2004; 4.3 cm infrarenal in 05/2011  . Tobacco abuse     50-pack-year consumption; quit in 12/2010  . Hyperlipidemia   . OSA (obstructive sleep apnea)   . Hypertension   . Benign prostatic hypertrophy       s/p transurethral resection of the prostate  . Obesity   . Tubular adenoma of colon   . Cataract   . Ventral hernia   . Diverticulosis   . Bilateral renal masses     Cystic, more prominent on CT in 12/2010 than 2007; followed by Dr. Rosana [name]  . ITP (idiopathic thrombocytopenic purpura) 09/07/2012    Chronic ITP of adults versus medication-induced ITP.  Stable  . Insomnia   . Bladder cancer 1996    Transurethral resection of the bladder + chemotherapy/BCG as premed  . Adenocarcinoma, lung 01/2011    transthoracic FNA; resection of the superior segment of the RLL in 03/2011; negative nodes; no chemotherapy nor radiation planned  . Chronic kidney disease     Creatinine 1.4 on discharge 12/20/2100; proteinuria; normal renal ultrasound in 2010; recent creatinines of 1.7-2.; Bilateral cystic renal masses by CT in 2011  . Nephrolithiasis 2012    ARF in 01/2011 due to obstructing nephrolithiasis  . Arm fracture     right arm  . Essential and other specified forms of tremor 02/23/2013  . Diplopia 02/23/2013  . Polyneuropathy in diabetes(357.2) 02/23/2013  . Abnormality of gait 02/23/2013  . Hx of Clostridium difficile infection   . Adenocarcinoma of right lung 02/24/2011    Ct A/P 2012:  2cm lung mass RLL PET 7619:  Hypermetabolic RLL mass, no other hypermetabolic areas. TTNA 02/2011:  Adenocarcinoma, markers c/w lung origin Right lower lobe superior segmentectomy. 04/01/2011 Dr. Arlyce Dice     Past Surgical History  Procedure Laterality Date  . Transurethral resection of prostate    . Cystectomy    . Tonsillectomy    . Colonoscopy  03/19/2010    Dr. Gala Romney -(poor prep) Anal papilla, rectal hyperplastic polyp, tubular adenoma removed splenic flexure, left-sided diverticula  . Wedge resection  04/2011    carcinoma of lung  . Colonoscopy  11/28/2004    RMR:  Diminutive rectal and left colon polyps as described above, cold  biopsied/removed/  Left sided diverticula. The remainder of the colonic mucosa appeared  normal.  . Colonoscopy   09/15/01    RMR: Multiple diminutive polyps destroyed with dermolysis as described above/ Multiple small polyps on stalks in the colon resected with snare cautery/ Scattered pan colonic diverticulum/ The remainder of the colonic mucosa appeared normal  . Abdominal aortic aneurysm repair  2004  . Cardiac catheterization    . Lung  lobectomy      BVP:LWUZRV of systems complete and found to be negative unless listed above  PHYSICAL EXAM BP 130/66  Pulse 66  Ht 6' (1.829 m)  Wt 269 lb (122.018 kg)  BMI 36.48 kg/m2  General: Well developed, well nourished, in no acute distress, obese Head: Eyes PERRLA, No xanthomas.   Normal cephalic and atramatic  Lungs: Clear bilaterally to auscultation and percussion. Heart: HRRR S1 S2, without MRG.  Pulses are 2+ & equal.            No carotid bruit. No JVD.  No abdominal bruits. No femoral bruits. Abdomen: Bowel sounds are positive, abdomen soft and non-tender without masses, ventral hernia is noted.  Msk:  Back normal, normal gait. Normal strength and tone for age. Extremities: No clubbing, cyanosis or edema.  DP +1 Neuro: Alert and oriented X 3. Psych:  Good affect, responds appropriately    ASSESSMENT AND PLAN

## 2013-08-25 NOTE — Assessment & Plan Note (Signed)
Will repeat abdominal ultrasound for evaluation of AAA on followup visit unless he becomes symptomatic.

## 2013-08-25 NOTE — Assessment & Plan Note (Signed)
Blood pressure is currently well-controlled. He will remain on metoprolol 50 mg twice a day, and lisinopril 2.5 mg daily. Labs are completed  primary care physician, Dr. Wolfgang Phoenix.

## 2013-08-25 NOTE — Patient Instructions (Signed)
Your physician recommends that you schedule a follow-up appointment in: 1 year with Dr Koneswaran You will receive a reminder letter two months in advance reminding you to call and schedule your appointment. If you don't receive this letter, please contact our office.  Your physician recommends that you continue on your current medications as directed. Please refer to the Current Medication list given to you today.     

## 2013-08-26 ENCOUNTER — Ambulatory Visit (INDEPENDENT_AMBULATORY_CARE_PROVIDER_SITE_OTHER): Payer: Medicare Other | Admitting: Nurse Practitioner

## 2013-08-26 ENCOUNTER — Encounter: Payer: Self-pay | Admitting: Nurse Practitioner

## 2013-08-26 VITALS — BP 116/67 | Ht 72.0 in | Wt 271.0 lb

## 2013-08-26 DIAGNOSIS — H532 Diplopia: Secondary | ICD-10-CM

## 2013-08-26 DIAGNOSIS — G250 Essential tremor: Secondary | ICD-10-CM

## 2013-08-26 DIAGNOSIS — E1142 Type 2 diabetes mellitus with diabetic polyneuropathy: Secondary | ICD-10-CM

## 2013-08-26 DIAGNOSIS — R269 Unspecified abnormalities of gait and mobility: Secondary | ICD-10-CM

## 2013-08-26 DIAGNOSIS — G252 Other specified forms of tremor: Principal | ICD-10-CM

## 2013-08-26 NOTE — Progress Notes (Signed)
[facility]  PATIENT: Kyle [name] DOB: [birth year]   REASON FOR VISIT: Followup for tremor    HISTORY OF PRESENT ILLNESS: Kyle [name], 80 year old male returns for followup. He has a history of essential tremor for about 2-1/2 years and the tremor is not present at rest only when he is trying to use the arms. It varies from day-to-day, he has not had difficulty performing his activities of daily living. He was recently diagnosed with bladder cancer and is receiving chemotherapy. He has a chronic gait disorder and occasionally falls, he also has a 5 to 6 year history of diplopia. He was recently evaluated by Kyle [name] at  [name] and prism glasses were recommended. Labs for acetylcholine receptor, angiotensin converting enzyme, methylmalonic acid and vitamin B12 returned normal. MRI of the brain with diffuse atrophy. He returns for reevaluation    HISTORY:  of obesity, diabetes, hypertension, COPD, and tremor. The patient has a history of tremor that dates back at least 2 years. The patient indicates that the tremors are affecting both upper extremities, and the tremor is present when using the arms, not at rest. The patient indicates that there may be variability from day-to-day with the tremor, and the tremor tends to be worse when he is tired or sick. The patient has troubles with feeding himself, and difficulty with handwriting. The patient denies any tremor affecting the head or neck or any vocal tremor. The patient indicates that he was recently in the [name] with a pulmonary infection, and the tremor significantly worsened at that time. The patient is on metoprolol, and he takes alprazolam at times for sleep. The patient indicates that his father may have had a tremor as he got older. The patient indicates that he has a chronic gait disorder, and he is now using a walker for ambulation. The patient has fallen on occasion. The patient reports some numbness in  the feet, with occasional burning and stinging in his feet at night. The patient will have occasional bowel incontinence, and some urinary frequency. The patient is sent to this office for an evaluation. The patient also goes on to say that he has had double vision in a horizontal plane for 4 or 5 years, and this issue has not been evaluated. The double vision does impair his ability to drive to some degree, and he cannot read a newspaper well. The patient denies headaches, or changes in speech. The patient does have some slight swallowing issues.    REVIEW OF SYSTEMS: Full 14 system review of systems performed and notable only for those listed, all others are neg:  Constitutional: N/A  Cardiovascular: N/A  Ear/Nose/Throat: Hearing loss  Skin: N/A  Eyes: Double vision  Respiratory: N/A  Gastroitestinal: N/A  Hematology/Lymphatic: N/A  Endocrine: N/A Musculoskeletal:N/A  Allergy/Immunology: N/A  Neurological: N/A Psychiatric: N/A   ALLERGIES: Allergies  Allergen Reactions  . Codeine Anaphylaxis  . Etodolac     dizziness    HOME MEDICATIONS: Outpatient Prescriptions Prior to Visit  Medication Sig Dispense Refill  . ACCU-CHEK AVIVA PLUS test strip       . ACCU-CHEK SOFTCLIX LANCETS lancets       . acetaminophen (TYLENOL) 500 MG tablet Take 500 mg by mouth every 6 (six) hours as needed.        . ALPRAZolam (XANAX) 0.5 MG tablet Take 0.5 mg by mouth 2 (two) times daily as needed.  60 tablet  5  . aspirin EC 81 MG tablet  81 mg. Take 81 mg by mouth daily.      . Blood Glucose Monitoring Suppl (ACCU-CHEK AVIVA PLUS) W/DEVICE KIT       . cetirizine (ZYRTEC) 10 MG tablet Take 10 mg by mouth daily.       . ciprofloxacin (CIPRO) 500 MG tablet Take 500 mg by mouth 2 (two) times daily.       . citalopram (CELEXA) 20 MG tablet TAKE ONE TABLET BY MOUTH ONCE DAILY  90 tablet  1  . ferrous sulfate (FERROUSUL) 325 (65 FE) MG tablet Take 325 mg by mouth daily with breakfast.        . fluticasone  (FLONASE) 50 MCG/ACT nasal spray Place 2 sprays into both nostrils daily.  16 g  5  . glipiZIDE (GLUCOTROL XL) 2.5 MG 24 hr tablet Take 1 tablet (2.5 mg total) by mouth daily with breakfast.  30 tablet  5  . lisinopril (PRINIVIL,ZESTRIL) 2.5 MG tablet Take 2.5 mg by mouth daily.      . meclizine (ANTIVERT) 25 MG tablet Take 25 mg by mouth as needed.      . metoprolol (LOPRESSOR) 50 MG tablet Take 1 tablet (50 mg total) by mouth 2 (two) times daily.  180 tablet  1  . niacin (NIASPAN) 500 MG CR tablet TAKE TWO TABLETS BY MOUTH ONCE DAILY AT BEDTIME  180 tablet  1  . nitroGLYCERIN (NITROSTAT) 0.4 MG SL tablet Place 1 tablet (0.4 mg total) under the tongue as needed. Place 0.4 mg under the tongue every 5 (five) minutes as needed. Call MD if need more than 2  3 tablet  5  . pravastatin (PRAVACHOL) 80 MG tablet TAKE ONE TABLET BY MOUTH ONCE DAILY  90 tablet  1  . tiotropium (SPIRIVA) 18 MCG inhalation capsule Place 1 capsule (18 mcg total) into inhaler and inhale daily.  10 capsule  0  . Vitamin D, Ergocalciferol, (DRISDOL) 50000 UNITS CAPS capsule TAKE ONE CAPSULE BY MOUTH MONTHLY  3 capsule  1  . albuterol (PROAIR HFA) 108 (90 BASE) MCG/ACT inhaler Inhale 2 puffs into the lungs every 6 (six) hours as needed for wheezing.  1 Inhaler  1   No facility-administered medications prior to visit.    PAST MEDICAL HISTORY: Past Medical History  Diagnosis Date  . Arteriosclerotic cardiovascular disease (ASCVD) 1973, 12/2010    S/P NSTEMI secondary to distal RCA/PL lesion, tx medically.  EF of  55%-60% per  echo.  . Diabetes mellitus     Type II  . Thrombocytopenia   . COPD (chronic obstructive pulmonary disease)   . AAA (abdominal aortic aneurysm) 2004    s/p repair 2004; 4.3 cm infrarenal in 05/2011  . Tobacco abuse     50-pack-year consumption; quit in 12/2010  . Hyperlipidemia   . OSA (obstructive sleep apnea)   . Hypertension   . Benign prostatic hypertrophy     s/p transurethral resection of the  prostate  . Obesity   . Tubular adenoma of colon   . Cataract   . Ventral hernia   . Diverticulosis   . Bilateral renal masses     Cystic, more prominent on CT in 12/2010 than 2007; followed by Dr. Rosana Hoes  . ITP (idiopathic thrombocytopenic purpura) 09/07/2012    Chronic ITP of adults versus medication-induced ITP.  Stable  . Insomnia   . Bladder cancer 1996    Transurethral resection of the bladder + chemotherapy/BCG as premed  . Adenocarcinoma, lung 01/2011  transthoracic FNA; resection of the superior segment of the RLL in 03/2011; negative nodes; no chemotherapy nor radiation planned  . Chronic kidney disease     Creatinine 1.4 on discharge 12/20/2100; proteinuria; normal renal ultrasound in 2010; recent creatinines of 1.7-2.; Bilateral cystic renal masses by CT in 2011  . Nephrolithiasis 2012    ARF in 01/2011 due to obstructing nephrolithiasis  . Arm fracture     right arm  . Essential and other specified forms of tremor 02/23/2013  . Diplopia 02/23/2013  . Polyneuropathy in diabetes(357.2) 02/23/2013  . Abnormality of gait 02/23/2013  . Hx of Clostridium difficile infection   . Adenocarcinoma of right lung 02/24/2011    Ct A/P 2012:  2cm lung mass RLL PET 9323:  Hypermetabolic RLL mass, no other hypermetabolic areas. TTNA 02/2011:  Adenocarcinoma, markers c/w lung origin Right lower lobe superior segmentectomy. 04/01/2011 Dr. Arlyce Dice     PAST SURGICAL HISTORY: Past Surgical History  Procedure Laterality Date  . Transurethral resection of prostate    . Cystectomy    . Tonsillectomy    . Colonoscopy  03/19/2010    Dr. Gala Romney -(poor prep) Anal papilla, rectal hyperplastic polyp, tubular adenoma removed splenic flexure, left-sided diverticula  . Wedge resection  04/2011    carcinoma of lung  . Colonoscopy  11/28/2004    RMR:  Diminutive rectal and left colon polyps as described above, cold  biopsied/removed/  Left sided diverticula. The remainder of the colonic mucosa appeared normal.  .  Colonoscopy   09/15/01    RMR: Multiple diminutive polyps destroyed with dermolysis as described above/ Multiple small polyps on stalks in the colon resected with snare cautery/ Scattered pan colonic diverticulum/ The remainder of the colonic mucosa appeared normal  . Abdominal aortic aneurysm repair  2004  . Cardiac catheterization    . Lung lobectomy      FAMILY HISTORY: Family History  Problem Relation Age of Onset  . Aortic aneurysm Mother   . Emphysema Father     smoker  . Clotting disorder Father   . Arthritis Father   . Hypertension Father   . Diabetes Father   . Aortic aneurysm Father   . Tremor Father   . Stroke Paternal Grandmother   . Other Paternal Grandfather     brain aneurysm    SOCIAL HISTORY: History   Social History  . Marital Status: Married    Spouse Name: [name]     Number of Children: 2  . Years of Education: 12+   Occupational History  . Retired  [employer]   Social History Main Topics  . Smoking status: Former Smoker -- 1.00 packs/day for 50 years    Types: Cigarettes    Quit date: 12/19/2012  . Smokeless tobacco: Never Used  . Alcohol Use: No  . Drug Use: No  . Sexual Activity: No   Other Topics Concern  . Not on file   Social History Narrative   Patient lives at home with his wife [name].    Patient has 2 children.    Patient is retired.    Patient is right handed.    Patient has 2 years of college.      PHYSICAL EXAM  Filed Vitals:   08/26/13 0925  BP: 116/67  Height: 6' (1.829 m)  Weight: 271 lb (122.925 kg)   Body mass index is 36.75 kg/(m^2).  Generalized: Well developed, obese male  in no acute distress  Head: normocephalic and atraumatic,. Oropharynx benign  Neck: Supple, no carotid bruits  Cardiac: Regular rate rhythm, no murmur  Musculoskeletal: No deformity   Neurological examination   Mentation: Alert oriented to time, place, history taking. Follows all commands speech and language fluent  Cranial nerve  II-XII: Pupils were equal round reactive to light extraocular movements were full, visual field were full on confrontational test. Mild ptosis on the left.Facial sensation and strength were normal. Hard of hearing Uvula tongue midline. head turning and shoulder shrug were normal and symmetric.Tongue protrusion into cheek strength was normal. Motor: normal bulk and tone, full strength in the BUE, BLE, fine finger movements normal, no pronator drift. No focal weakness Sensory: Stocking pattern pinprick diminished sensory deficit one half way up the legs bilaterally and a reduction and position sense on the right foot vibratory sensation is decreased in both feet Coordination: finger-nose-finger, heel-to-shin bilaterally, no dysmetria, mild intention tremor bilaterally, no resting tremor.  Reflexes:  depressed bilaterally upper and lower Gait and Station: Rising up from seated position without assistance, wide base  stance,  moderate stride, good arm swing, smooth turning, able to perform tiptoe, and heel walking without difficulty. Tandem gait is unsteady. No assistive device  DIAGNOSTIC DATA (LABS, IMAGING, TESTING) - I reviewed patient records, labs, notes, testing and imaging myself where available.  Lab Results  Component Value Date   WBC 8.7 06/03/2013   HGB 14.6 06/03/2013   HCT 43.8 06/03/2013   MCV 93.4 06/03/2013   PLT 165.0 06/03/2013      Component Value Date/Time   NA 139 03/04/2013 1003   K 4.5 03/04/2013 1003   CL 103 03/04/2013 1003   CO2 27 03/04/2013 1003   GLUCOSE 93 03/04/2013 1003   BUN 21 03/04/2013 1003   CREATININE 2.30* 06/15/2013 0911   CREATININE 1.88* 11/13/2011 1049   CALCIUM 9.6 03/04/2013 1003   PROT 6.3 06/10/2013 0952   ALBUMIN 4.0 06/10/2013 0952   AST 18 06/10/2013 0952   ALT 15 06/10/2013 0952   ALKPHOS 50 06/10/2013 0952   BILITOT 0.8 06/10/2013 0952   GFRNONAA 39* 03/04/2013 1003   GFRAA 45* 03/04/2013 1003   Lab Results  Component Value Date   CHOL  130 06/10/2013   HDL 26* 06/10/2013   LDLCALC 73 06/10/2013   TRIG 154* 06/10/2013   CHOLHDL 5.0 06/10/2013   Lab Results  Component Value Date   HGBA1C 5.1 06/10/2013   Lab Results  Component Value Date   VITAMINB12 420 02/23/2013   Lab Results  Component Value Date   TSH 3.506 01/31/2013      ASSESSMENT AND PLAN  80 y.o. year old male  has a past medical history of Arteriosclerotic cardiovascular disease (ASCVD) (1973, 12/2010); Diabetes mellitus; benign essential tremor, diabetic peripheral neuropathy and gait disorder and double vision here to followup.  MRI of the brain with moderate generalized atrophy, will follow MMSE over time, pt is not interested in memory medication at this time Tremor is stable Patient to get prism glasses for diplopia F/U yearly Dennie Bible, [facility], [facility], [facility] [address], [place], [place] [address] [phone]

## 2013-08-26 NOTE — Progress Notes (Signed)
I have read the note, and I agree with the clinical assessment and plan.  WILLIS,CHARLES KEITH   

## 2013-08-26 NOTE — Patient Instructions (Signed)
MRI of the brain with moderate generalized atrophy Tremor is stable Patient to get prism glasses for diplopia F/U yearly

## 2013-08-31 ENCOUNTER — Ambulatory Visit (INDEPENDENT_AMBULATORY_CARE_PROVIDER_SITE_OTHER): Payer: Medicare Other | Admitting: Pulmonary Disease

## 2013-08-31 ENCOUNTER — Encounter: Payer: Self-pay | Admitting: Pulmonary Disease

## 2013-08-31 VITALS — BP 102/70 | HR 60 | Temp 97.9°F | Ht 72.0 in | Wt 270.0 lb

## 2013-08-31 DIAGNOSIS — J449 Chronic obstructive pulmonary disease, unspecified: Secondary | ICD-10-CM

## 2013-08-31 MED ORDER — TIOTROPIUM BROMIDE MONOHYDRATE 18 MCG IN CAPS: 18.0000 ug | ORAL_CAPSULE | Freq: Every day | RESPIRATORY_TRACT | Status: DC

## 2013-08-31 NOTE — Assessment & Plan Note (Signed)
Patient feels that he is at a stable baseline on Spiriva alone, and I have discussed with him intensifying his bronchodilator regimen. He would like to hold off on this, and see how he does. I did recommend participating in pulmonary rehabilitation, and the patient is agreeable to a referral to [facility]  I have also stressed to him the importance of aggressive weight loss and conditioning, and how it impacts his exertional tolerance.

## 2013-08-31 NOTE — Patient Instructions (Signed)
Continue on spiriva with albuterol as needed for rescue. Work on weight loss and conditioning.  Will refer to pulmonary rehab program at [facility] followup with me again in 85mos.

## 2013-08-31 NOTE — Progress Notes (Signed)
   Subjective:    Patient ID: Kyle [name], male    DOB: [birth year], 80 y.o.   MRN: [id number]  HPI The patient comes in today for followup of his known COPD. He is staying on Spiriva compliantly, and tells me that he has not had an acute exacerbation since last visit. He has minimal cough or mucus production, and feels that his exertional tolerance is at baseline. He has not been trying to increase his activity or work on weight loss.   Review of Systems  Constitutional: Negative for fever and unexpected weight change.  HENT: Negative for congestion, dental problem, ear pain, nosebleeds, postnasal drip, rhinorrhea, sinus pressure, sneezing, sore throat and trouble swallowing.   Eyes: Negative for redness and itching.  Respiratory: Positive for cough and shortness of breath. Negative for chest tightness and wheezing.   Cardiovascular: Negative for palpitations and leg swelling.  Gastrointestinal: Negative for nausea and vomiting.  Genitourinary: Negative for dysuria.  Musculoskeletal: Negative for joint swelling.  Skin: Negative for rash.  Neurological: Negative for headaches.  Hematological: Does not bruise/bleed easily.  Psychiatric/Behavioral: Negative for dysphoric mood. The patient is not nervous/anxious.        Objective:   Physical Exam Obese male in no acute distress Nose without purulence or discharge noted Neck without lymphadenopathy or thyromegaly Chest with mildly decreased breath sounds, no wheezing Cardiac exam with regular rate and rhythm Lower extremities with mild ankle edema, no cyanosis Alert and oriented, moves all 4 extremities.       Assessment & Plan:

## 2013-09-01 ENCOUNTER — Other Ambulatory Visit: Payer: Self-pay | Admitting: Family Medicine

## 2013-09-12 ENCOUNTER — Ambulatory Visit: Payer: Medicare Other | Admitting: Family Medicine

## 2013-09-12 ENCOUNTER — Encounter (HOSPITAL_COMMUNITY): Payer: Medicare Other | Attending: Oncology

## 2013-09-12 DIAGNOSIS — D696 Thrombocytopenia, unspecified: Secondary | ICD-10-CM

## 2013-09-12 DIAGNOSIS — C3430 Malignant neoplasm of lower lobe, unspecified bronchus or lung: Secondary | ICD-10-CM

## 2013-09-12 DIAGNOSIS — C3490 Malignant neoplasm of unspecified part of unspecified bronchus or lung: Secondary | ICD-10-CM | POA: Insufficient documentation

## 2013-09-12 DIAGNOSIS — C3491 Malignant neoplasm of unspecified part of right bronchus or lung: Secondary | ICD-10-CM

## 2013-09-12 DIAGNOSIS — D693 Immune thrombocytopenic purpura: Secondary | ICD-10-CM | POA: Insufficient documentation

## 2013-09-12 LAB — CBC WITH DIFFERENTIAL/PLATELET
Basophils Absolute: 0 10*3/uL (ref 0.0–0.1)
Basophils Relative: 0 % (ref 0–1)
EOS PCT: 4 % (ref 0–5)
Eosinophils Absolute: 0.3 10*3/uL (ref 0.0–0.7)
HCT: 46.9 % (ref 39.0–52.0)
Hemoglobin: 15.2 g/dL (ref 13.0–17.0)
LYMPHS ABS: 2.4 10*3/uL (ref 0.7–4.0)
Lymphocytes Relative: 34 % (ref 12–46)
MCH: 30.6 pg (ref 26.0–34.0)
MCHC: 32.4 g/dL (ref 30.0–36.0)
MCV: 94.6 fL (ref 78.0–100.0)
Monocytes Absolute: 0.5 10*3/uL (ref 0.1–1.0)
Monocytes Relative: 7 % (ref 3–12)
NEUTROS PCT: 55 % (ref 43–77)
Neutro Abs: 3.8 10*3/uL (ref 1.7–7.7)
Platelets: 119 10*3/uL — ABNORMAL LOW (ref 150–400)
RBC: 4.96 MIL/uL (ref 4.22–5.81)
RDW: 16.5 % — ABNORMAL HIGH (ref 11.5–15.5)
WBC: 6.9 10*3/uL (ref 4.0–10.5)

## 2013-09-12 LAB — COMPREHENSIVE METABOLIC PANEL
ALK PHOS: 61 U/L (ref 39–117)
ALT: 11 U/L (ref 0–53)
AST: 13 U/L (ref 0–37)
Albumin: 3.8 g/dL (ref 3.5–5.2)
BUN: 31 mg/dL — ABNORMAL HIGH (ref 6–23)
CHLORIDE: 104 meq/L (ref 96–112)
CO2: 28 meq/L (ref 19–32)
Calcium: 9.4 mg/dL (ref 8.4–10.5)
Creatinine, Ser: 1.79 mg/dL — ABNORMAL HIGH (ref 0.50–1.35)
GFR calc Af Amer: 40 mL/min — ABNORMAL LOW (ref >90)
GFR, EST NON AFRICAN AMERICAN: 35 mL/min — AB (ref >90)
GLUCOSE: 122 mg/dL — AB (ref 70–99)
POTASSIUM: 4.6 meq/L (ref 3.7–5.3)
SODIUM: 142 meq/L (ref 137–147)
Total Bilirubin: 1.1 mg/dL (ref 0.3–1.2)
Total Protein: 7.5 g/dL (ref 6.0–8.3)

## 2013-09-12 NOTE — Progress Notes (Signed)
Labs drawn today for cbc/diff,cmp 

## 2013-09-16 ENCOUNTER — Ambulatory Visit (HOSPITAL_COMMUNITY)
Admission: RE | Admit: 2013-09-16 | Discharge: 2013-09-16 | Disposition: A | Discharge status: Home / Self Care | Payer: Medicare Other | Source: Ambulatory Visit | Attending: Oncology | Admitting: Oncology

## 2013-09-16 DIAGNOSIS — C3490 Malignant neoplasm of unspecified part of unspecified bronchus or lung: Secondary | ICD-10-CM | POA: Insufficient documentation

## 2013-09-16 DIAGNOSIS — K8020 Calculus of gallbladder without cholecystitis without obstruction: Secondary | ICD-10-CM | POA: Insufficient documentation

## 2013-09-16 DIAGNOSIS — R918 Other nonspecific abnormal finding of lung field: Secondary | ICD-10-CM | POA: Insufficient documentation

## 2013-09-16 DIAGNOSIS — I714 Abdominal aortic aneurysm, without rupture, unspecified: Secondary | ICD-10-CM | POA: Insufficient documentation

## 2013-09-16 DIAGNOSIS — C3491 Malignant neoplasm of unspecified part of right bronchus or lung: Secondary | ICD-10-CM

## 2013-09-16 DIAGNOSIS — D3500 Benign neoplasm of unspecified adrenal gland: Secondary | ICD-10-CM | POA: Insufficient documentation

## 2013-09-18 NOTE — Progress Notes (Signed)
[name], MD [place] [address]  Adenocarcinoma of right lung - Plan: NM PET Image Initial (PI) Skull Base To Thigh, Ambulatory referral to Cardiothoracic Surgery  Abnormal CT scan, chest - Plan: NM PET Image Initial (PI) Skull Base To Thigh, Ambulatory referral to Cardiothoracic Surgery  ITP (idiopathic thrombocytopenic purpura)  CURRENT THERAPY:Surveillance per NCCN guidelines.  INTERVAL HISTORY: CHIEF WALKUP 80 y.o. male returns for  regular  visit for followup of Stage I a (T1 B., N0, M0) moderately differentiated adenocarcinoma the lung, 2.3 cm in size with surgery on 04/03/2011. Margins were clear, no LV I was seen, and there is no pleural involvement. 6 lymph nodes were all negative getting stage IA disease.  AND Thrombocytopenia thus far consistent with chronic ITP of adults versus drug-induced mild thrombocytopenia. He is on too many medications to withdraw them completely. There is no evidence on his Feb 2014 CT scan of splenomegaly or cirrhosis.    Adenocarcinoma of right lung   04/03/2011 Initial Diagnosis INVASIVE MODERATELY DIFFERENTIATED ADENOCARCINOMA, 2.3 CM. (T1b, N0).  Clear margins, no LVI, no pleural involvement. 0/6 nodes.   04/03/2011 Surgery Right lower lobe superior segmentectomy by Dr. Arlyce Dice   04/04/2011 Remission      I personally reviewed and went over laboratory results with the patient.  The results are noted within this dictation.  Platelet count is very stable and safe at 119,000 on 2/23.  I personally reviewed and went over radiographic studies with the patient.  The results are noted within this dictation.  CT of chest demonstrates the following: 1. Progressive volume loss medially at the right lung base, likely attributable to the airway plugging in this vicinity. Differentialdiagnostic considerations include mucus plugging, radiation therapy related findings, or progressive bronchogenic tumor causing increasing  atelectasis. Bronchoscopy or PET-CT could be utilized for further characterization.    With recent CT scans over the past 6 months being abnormal requiring close interval surveillance, we'll follow radiology's recommendations and perform a PET/CT and refer the patient to thoracic surgery for consideration of bronchoscopy. We'll get a PET scan ordered prior to thoracic surgery consultation. Depending on these results that consultation can be canceled but the patient may benefit for from bronchoscopy if this is mucus plugging.  The patient is agreeable to the aforementioned plan.  He denies any complaints including hemoptysis, cough, shortness of breath, dyspnea on exertion, and unintentional weight loss.  He does note some upper respiratory sinusitis-like issues with clear sputum on cough and clear nasal discharge. He is taking Zyrtec. Recommend he continue with symptomatic management of this.  Oncologically, the patient denies any complaints and ROS questioning is negative.    Past Medical History  Diagnosis Date  . Arteriosclerotic cardiovascular disease (ASCVD) 1973, 12/2010    S/P NSTEMI secondary to distal RCA/PL lesion, tx medically.  EF of  55%-60% per  echo.  . Diabetes mellitus     Type II  . Thrombocytopenia   . COPD (chronic obstructive pulmonary disease)   . AAA (abdominal aortic aneurysm) 2004    s/p repair 2004; 4.3 cm infrarenal in 05/2011  . Tobacco abuse     50-pack-year consumption; quit in 12/2010  . Hyperlipidemia   . OSA (obstructive sleep apnea)   . Hypertension   . Benign prostatic hypertrophy     s/p transurethral resection of the prostate  . Obesity   . Tubular adenoma of colon   . Cataract   . Ventral hernia   .  Diverticulosis   . Bilateral renal masses     Cystic, more prominent on CT in 12/2010 than 2007; followed by Dr. Rosana Hoes  . ITP (idiopathic thrombocytopenic purpura) 09/07/2012    Chronic ITP of adults versus medication-induced ITP.  Stable  .  Insomnia   . Bladder cancer 1996    Transurethral resection of the bladder + chemotherapy/BCG as premed  . Adenocarcinoma, lung 01/2011    transthoracic FNA; resection of the superior segment of the RLL in 03/2011; negative nodes; no chemotherapy nor radiation planned  . Chronic kidney disease     Creatinine 1.4 on discharge 12/20/2100; proteinuria; normal renal ultrasound in 2010; recent creatinines of 1.7-2.; Bilateral cystic renal masses by CT in 2011  . Nephrolithiasis 2012    ARF in 01/2011 due to obstructing nephrolithiasis  . Arm fracture     right arm  . Essential and other specified forms of tremor 02/23/2013  . Diplopia 02/23/2013  . Polyneuropathy in diabetes(357.2) 02/23/2013  . Abnormality of gait 02/23/2013  . Hx of Clostridium difficile infection   . Adenocarcinoma of right lung 02/24/2011    Ct A/P 2012:  2cm lung mass RLL PET 1245:  Hypermetabolic RLL mass, no other hypermetabolic areas. TTNA 02/2011:  Adenocarcinoma, markers c/w lung origin Right lower lobe superior segmentectomy. 04/01/2011 Dr. Arlyce Dice     has Arteriosclerotic cardiovascular disease (ASCVD); Hypertension; CKD (chronic kidney disease) stage 3, GFR 30-59 ml/min; COPD (chronic obstructive pulmonary disease); Adenocarcinoma of right lung; Obstructive sleep apnea; Fasting hyperglycemia; Nephrolithiasis; Bladder carcinoma; Diarrhea; Tubular adenoma of colon; Ventral hernia; AAA (abdominal aortic aneurysm); Tobacco abuse; LLQ pain; ITP (idiopathic thrombocytopenic purpura); Type II or unspecified type diabetes mellitus with neurological manifestations, not stated as uncontrolled(250.60); Fracture of right distal radius; COPD exacerbation; Essential and other specified forms of tremor; Diplopia; Polyneuropathy in diabetes(357.2); and Abnormality of gait on his problem list.     is allergic to codeine and etodolac.  Mr. Labarge does not currently have medications on file.  Past Surgical History  Procedure Laterality Date  .  Transurethral resection of prostate    . Cystectomy    . Tonsillectomy    . Colonoscopy  03/19/2010    Dr. Gala Romney -(poor prep) Anal papilla, rectal hyperplastic polyp, tubular adenoma removed splenic flexure, left-sided diverticula  . Wedge resection  04/2011    carcinoma of lung  . Colonoscopy  11/28/2004    RMR:  Diminutive rectal and left colon polyps as described above, cold  biopsied/removed/  Left sided diverticula. The remainder of the colonic mucosa appeared normal.  . Colonoscopy   09/15/01    RMR: Multiple diminutive polyps destroyed with dermolysis as described above/ Multiple small polyps on stalks in the colon resected with snare cautery/ Scattered pan colonic diverticulum/ The remainder of the colonic mucosa appeared normal  . Abdominal aortic aneurysm repair  2004  . Cardiac catheterization    . Lung lobectomy      Denies any headaches, dizziness, double vision, fevers, chills, night sweats, nausea, vomiting, diarrhea, constipation, chest pain, heart palpitations, shortness of breath, blood in stool, black tarry stool, urinary pain, urinary burning, urinary frequency, hematuria.   PHYSICAL EXAMINATION  ECOG PERFORMANCE STATUS: 1 - Symptomatic but completely ambulatory  Filed Vitals:   09/19/13 1000  BP: 115/75  Pulse: 64  Temp: 97.6 F (36.4 C)  Resp: 18    GENERAL:alert, no distress, well nourished, well developed, comfortable, cooperative, obese and smiling SKIN: skin color, texture, turgor are normal, no rashes or significant  lesions HEAD: Normocephalic, No masses, lesions, tenderness or abnormalities EYES: normal, PERRLA, EOMI, Conjunctiva are pink and non-injected EARS: External ears normal OROPHARYNX:mucous membranes are moist  NECK: supple, no adenopathy, thyroid normal size, non-tender, without nodularity, no stridor, non-tender, trachea midline LYMPH:  no palpable lymphadenopathy BREAST:not examined LUNGS: clear to auscultation and percussion HEART:  regular rate & rhythm, no murmurs and no gallops ABDOMEN:abdomen soft, non-tender, obese and normal bowel sounds BACK: Back symmetric, no curvature., No CVA tenderness EXTREMITIES:less then 2 second capillary refill, no joint deformities, effusion, or inflammation, no skin discoloration, no cyanosis  NEURO: alert & oriented x 3 with fluent speech, no focal motor/sensory deficits, gait normal   LABORATORY DATA: CBC    Component Value Date/Time   WBC 6.9 09/12/2013 0905   RBC 4.96 09/12/2013 0905   HGB 15.2 09/12/2013 0905   HCT 46.9 09/12/2013 0905   PLT 119* 09/12/2013 0905   MCV 94.6 09/12/2013 0905   MCH 30.6 09/12/2013 0905   MCHC 32.4 09/12/2013 0905   RDW 16.5* 09/12/2013 0905   LYMPHSABS 2.4 09/12/2013 0905   MONOABS 0.5 09/12/2013 0905   EOSABS 0.3 09/12/2013 0905   BASOSABS 0.0 09/12/2013 0905       Chemistry      Component Value Date/Time   NA 142 09/12/2013 0905   K 4.6 09/12/2013 0905   CL 104 09/12/2013 0905   CO2 28 09/12/2013 0905   BUN 31* 09/12/2013 0905   CREATININE 1.79* 09/12/2013 0905   CREATININE 1.88* 11/13/2011 1049      Component Value Date/Time   CALCIUM 9.4 09/12/2013 0905   ALKPHOS 61 09/12/2013 0905   AST 13 09/12/2013 0905   ALT 11 09/12/2013 0905   BILITOT 1.1 09/12/2013 0905       RADIOGRAPHIC STUDIES:  09/10/2013  CLINICAL DATA: Right lung adenocarcinoma  EXAM:  CT CHEST WITHOUT CONTRAST  TECHNIQUE:  Multidetector CT imaging of the chest was performed following the  standard protocol without IV contrast.  COMPARISON: CT CHEST W/O CM dated 06/15/2013; NM PET IMAGE RESTAG  (PS) SKULL BASE TO THIGH dated 03/16/2013; CT CHEST W/O CM dated  12/01/2011; NM PET IMAGE INITIAL (PI) SKULL BASE TO THIGH dated  02/17/2011  FINDINGS:  Subcarinal node 1.1 cm, image 28 of series 2, formerly 1.2 cm.  Coronary artery atherosclerosis noted. Increased volume loss along  the wedge resection/lobectomy site at the right posterior lung base.  Airway plugging is present  in this vicinity.  Stable 2 mm subpleural nodule, image 29 of series 3, no change from  02/17/2011, considered benign and inconsequential.  Mild chronic lingular scarring on images 32-33 of series 3.  Ventral upper abdominal hernia contains transverse colon and  omentum.  Stable 3 cm right adrenal adenoma, no change from 2007. Fusiform  aneurysm approximately at the level of the renal arteries, 4.6 cm  transverse by 4.0 cm anterior-posterior.  Dependent gallstones in the gallbladder.  Thoracic spondylosis with multilevel bridging anterior spurring.  IMPRESSION:  1. Progressive volume loss medially at the right lung base, likely  attributable to the airway plugging in this vicinity. Differential  diagnostic considerations include mucus plugging, radiation therapy  related findings, or progressive bronchogenic tumor causing  increasing atelectasis. Bronchoscopy or PET-CT could be utilized for  further characterization.  2. Atherosclerosis.  3. Ventral upper abdominal hernia containing transverse colon and  omentum.  4. Other incidental findings include chronic right adrenal adenoma,  thoracic spondylosis, fusiform abdominal aortic aneurysm, and  gallstones within the  gallbladder.  Electronically Signed  By: Sherryl Barters M.D.  On: Dec 17, 202015 10:47    ASSESSMENT:  1. Stage I a (T1 B., N0, M0) moderately differentiated adenocarcinoma the lung, 2.3 cm in size with surgery on 04/03/2011. Margins were clear, no LV I was seen, and there is no pleural involvement. 6 lymph nodes were all negative getting stage IA disease. Recent CT scan findings will require close interval follow-up CT scan.  2. Thrombocytopenia thus far consistent with chronic ITP of adults versus drug-induced mild thrombocytopenia. He is on too many medications to withdraw them completely. There is no evidence on his May 2013 CT scan of splenomegaly or cirrhosis. Stable.  3. Obesity  4. Intermittent diarrhea treated by GI    5. Bladder cancer followed by Dr. Rosana Hoes  6. MI x2 the last in May 2012  7. Abdominal hernia secondary to abdominal aortic aneurysm surgery in 2008  8. Double vision in the past which has not been a recent issue  9. History of skin cancer  10. Chronic renal disease, grade 3, followed by nephrologist  11. Elevated glucose, followed by Dr. Wolfgang Phoenix.  Patient Active Problem List   Diagnosis Date Noted  . Essential and other specified forms of tremor 02/23/2013  . Diplopia 02/23/2013  . Polyneuropathy in diabetes(357.2) 02/23/2013  . Abnormality of gait 02/23/2013  . COPD exacerbation 01/31/2013  . Fracture of right distal radius 12/14/2012  . Type II or unspecified type diabetes mellitus with neurological manifestations, not stated as uncontrolled(250.60) 11/30/2012  . ITP (idiopathic thrombocytopenic purpura) 09/07/2012  . LLQ pain 11/13/2011  . Ventral hernia 10/28/2011  . AAA (abdominal aortic aneurysm)   . Tobacco abuse   . Diarrhea 10/22/2011  . Tubular adenoma of colon   . Obstructive sleep apnea 03/27/2011  . Fasting hyperglycemia 03/27/2011  . Nephrolithiasis 03/27/2011  . Bladder carcinoma 03/27/2011  . COPD (chronic obstructive pulmonary disease) 02/24/2011  . Adenocarcinoma of right lung 02/24/2011  . Arteriosclerotic cardiovascular disease (ASCVD) 01/06/2011  . Hypertension 01/06/2011  . CKD (chronic kidney disease) stage 3, GFR 30-59 ml/min 01/06/2011    PLAN:  1. I personally reviewed and went over laboratory results with the patient.  The results are noted within this dictation. 2. I personally reviewed and went over radiographic studies with the patient.  The results are noted within this dictation.   3. PET scan for further evaluation of CT scan findings.  4. Referral to thoracic surgeon for consideration of bronchoscopy.  5. Return in 4 weeks for follow-up.   THERAPY PLAN:  Hematologically, the patient's platelet count is very stable. Oncologically, his  recent CT scan was worrisome for mucus plugging versus bronchogenic carcinoma. He did not receive radiation for his past therapy for treatment of his most also lung cancer 2012. Therefore, we will set him up for a PET scan and also a consultation with thoracic surgery for consideration of bronchoscopy.  All questions were answered. The patient knows to call the clinic with any problems, questions or concerns. We can certainly see the patient much sooner if necessary.  Patient and plan discussed with Dr. Farrel Gobble and he is in agreement with the aforementioned.   KEFALAS,THOMAS

## 2013-09-19 ENCOUNTER — Encounter (HOSPITAL_COMMUNITY): Payer: Self-pay | Admitting: Oncology

## 2013-09-19 ENCOUNTER — Encounter (HOSPITAL_COMMUNITY): Payer: Medicare Other | Attending: Oncology | Admitting: Oncology

## 2013-09-19 VITALS — BP 115/75 | HR 64 | Temp 97.6°F | Resp 18 | Wt 269.2 lb

## 2013-09-19 DIAGNOSIS — D696 Thrombocytopenia, unspecified: Secondary | ICD-10-CM

## 2013-09-19 DIAGNOSIS — N189 Chronic kidney disease, unspecified: Secondary | ICD-10-CM

## 2013-09-19 DIAGNOSIS — C3490 Malignant neoplasm of unspecified part of unspecified bronchus or lung: Secondary | ICD-10-CM | POA: Insufficient documentation

## 2013-09-19 DIAGNOSIS — C3491 Malignant neoplasm of unspecified part of right bronchus or lung: Secondary | ICD-10-CM

## 2013-09-19 DIAGNOSIS — R9389 Abnormal findings on diagnostic imaging of other specified body structures: Secondary | ICD-10-CM | POA: Insufficient documentation

## 2013-09-19 DIAGNOSIS — D693 Immune thrombocytopenic purpura: Secondary | ICD-10-CM

## 2013-09-19 DIAGNOSIS — R7309 Other abnormal glucose: Secondary | ICD-10-CM

## 2013-09-19 DIAGNOSIS — C3430 Malignant neoplasm of lower lobe, unspecified bronchus or lung: Secondary | ICD-10-CM

## 2013-09-19 NOTE — Patient Instructions (Signed)
[facility] Discharge Instructions  RECOMMENDATIONS MADE BY THE CONSULTANT AND ANY TEST RESULTS WILL BE SENT TO YOUR REFERRING PHYSICIAN.  We will see you in 4 weeks. PET scan in 7-10 days and appointment with thoracic surgery.   Thank you for choosing [facility] to provide your oncology and hematology care.  To afford each patient quality time with our providers, please arrive at least 15 minutes before your scheduled appointment time.  With your help, our goal is to use those 15 minutes to complete the necessary work-up to ensure our physicians have the information they need to help with your evaluation and healthcare recommendations.    Effective January 1st, 2014, we ask that you re-schedule your appointment with our physicians should you arrive 10 or more minutes late for your appointment.  We strive to give you quality time with our providers, and arriving late affects you and other patients whose appointments are after yours.    Again, thank you for choosing [facility].  Our hope is that these requests will decrease the amount of time that you wait before being seen by our physicians.       _____________________________________________________________  Should you have questions after your visit to [facility], please contact our office at [phone] between the hours of 8:30 a.m. and 5:00 p.m.  Voicemails left after 4:30 p.m. will not be returned until the following business day.  For prescription refill requests, have your pharmacy contact our office with your prescription refill request.

## 2013-09-28 ENCOUNTER — Ambulatory Visit (HOSPITAL_COMMUNITY): Payer: Medicare Other

## 2013-10-03 ENCOUNTER — Ambulatory Visit (HOSPITAL_COMMUNITY)
Admission: RE | Admit: 2013-10-03 | Discharge: 2013-10-03 | Disposition: A | Discharge status: Home / Self Care | Payer: Medicare Other | Source: Ambulatory Visit | Attending: Oncology | Admitting: Oncology

## 2013-10-03 DIAGNOSIS — J9819 Other pulmonary collapse: Secondary | ICD-10-CM | POA: Insufficient documentation

## 2013-10-03 DIAGNOSIS — K8020 Calculus of gallbladder without cholecystitis without obstruction: Secondary | ICD-10-CM | POA: Insufficient documentation

## 2013-10-03 DIAGNOSIS — C3491 Malignant neoplasm of unspecified part of right bronchus or lung: Secondary | ICD-10-CM

## 2013-10-03 DIAGNOSIS — N281 Cyst of kidney, acquired: Secondary | ICD-10-CM | POA: Insufficient documentation

## 2013-10-03 DIAGNOSIS — R9389 Abnormal findings on diagnostic imaging of other specified body structures: Secondary | ICD-10-CM

## 2013-10-03 DIAGNOSIS — C3490 Malignant neoplasm of unspecified part of unspecified bronchus or lung: Secondary | ICD-10-CM | POA: Insufficient documentation

## 2013-10-03 MED ORDER — FLUDEOXYGLUCOSE F - 18 (FDG) INJECTION
14.0000 | Freq: Once | INTRAVENOUS | Status: AC | PRN
  Administered 2013-10-03: 14 via INTRAVENOUS

## 2013-10-04 ENCOUNTER — Other Ambulatory Visit: Payer: Self-pay | Admitting: *Deleted

## 2013-10-04 ENCOUNTER — Encounter: Payer: Self-pay | Admitting: Thoracic Surgery (Cardiothoracic Vascular Surgery)

## 2013-10-04 ENCOUNTER — Encounter (HOSPITAL_COMMUNITY): Payer: Self-pay | Admitting: Pharmacy Technician

## 2013-10-04 ENCOUNTER — Institutional Professional Consult (permissible substitution) (INDEPENDENT_AMBULATORY_CARE_PROVIDER_SITE_OTHER): Payer: Medicare Other | Admitting: Thoracic Surgery (Cardiothoracic Vascular Surgery)

## 2013-10-04 VITALS — BP 124/74 | HR 72 | Resp 20 | Ht 72.0 in | Wt 270.0 lb

## 2013-10-04 DIAGNOSIS — J9811 Atelectasis: Secondary | ICD-10-CM

## 2013-10-04 DIAGNOSIS — C3490 Malignant neoplasm of unspecified part of unspecified bronchus or lung: Secondary | ICD-10-CM

## 2013-10-04 LAB — GLUCOSE, CAPILLARY: Glucose-Capillary: 113 mg/dL — ABNORMAL HIGH (ref 70–99)

## 2013-10-04 NOTE — Progress Notes (Signed)
PCP is [name], MD Referring Provider is Kefalas, Manon Hilding, PA-C  Chief Complaint  Patient presents with  . Lung Cancer    Surgical eval, PET Scan 10/03/13, Chest CT 09/16/13    HPI: [name] is a 80 year old gentleman with a history of lung cancer who is sent for consultation regarding persistent right lower lobe atelectasis.  [name] is a 80 year old gentleman with a remote history of tobacco abuse (quit in 2012, 50 pack years). His medical history is significant for COPD, obstructive sleep apnea, coronary artery disease, idiopathic thrombocytopenic purpura, abdominal aortic aneurysm repair, ventral hernia, and type 2 diabetes. In addition he has a history of stage IA non-small cell carcinoma (T1b, N0 adenocarcinoma) resected with a right lower lobe superior segmentectomy in 2012 by Dr. Arlyce Dice.   He has been followed by oncology. In August of 2014 there was noted to be some atelectasis in the right lower lobe. A PET CT at that time did not show a clear mass though somewhat compromised by a recent biopsy. He said he had a followup CT in November which showed the area of atelectasis was slightly improved. A more recent CT showed a more progressive atelectasis. A PET CT showed some generalized hypermetabolic activity in the area but no definite evidence of recurrence.  [name] says that he "gives out" with exertion. He cannot really be more specific about that sensation. He times describes it as generalized tiredness another time says he gets short of breath. He is not having any chest pain, but of course is diabetic. He says that he can walk less than 100 yards before he gets tired. He has not lost any weight. He has a cough productive of clear mucus, no hemoptysis. He does have obstructive sleep apnea and uses CPAP and 2.5 L of oxygen at night.  He has a 50-pack-year history of smoking but quit in 2013.   Past Medical History  Diagnosis Date  . Arteriosclerotic cardiovascular disease  (ASCVD) 1973, 12/2010    S/P NSTEMI secondary to distal RCA/PL lesion, tx medically.  EF of  55%-60% per  echo.  . Diabetes mellitus     Type II  . Thrombocytopenia   . COPD (chronic obstructive pulmonary disease)   . AAA (abdominal aortic aneurysm) 2004    s/p repair 2004; 4.3 cm infrarenal in 05/2011  . Tobacco abuse     50-pack-year consumption; quit in 12/2010  . Hyperlipidemia   . OSA (obstructive sleep apnea)   . Hypertension   . Benign prostatic hypertrophy     s/p transurethral resection of the prostate  . Obesity   . Tubular adenoma of colon   . Cataract   . Ventral hernia   . Diverticulosis   . Bilateral renal masses     Cystic, more prominent on CT in 12/2010 than 2007; followed by Dr. Rosana Hoes  . ITP (idiopathic thrombocytopenic purpura) 09/07/2012    Chronic ITP of adults versus medication-induced ITP.  Stable  . Insomnia   . Bladder cancer 1996    Transurethral resection of the bladder + chemotherapy/BCG as premed  . Adenocarcinoma, lung 01/2011    transthoracic FNA; resection of the superior segment of the RLL in 03/2011; negative nodes; no chemotherapy nor radiation planned  . Chronic kidney disease     Creatinine 1.4 on discharge 12/20/2100; proteinuria; normal renal ultrasound in 2010; recent creatinines of 1.7-2.; Bilateral cystic renal masses by CT in 2011  . Nephrolithiasis 2012    ARF in  01/2011 due to obstructing nephrolithiasis  . Arm fracture     right arm  . Essential and other specified forms of tremor 02/23/2013  . Diplopia 02/23/2013  . Polyneuropathy in diabetes(357.2) 02/23/2013  . Abnormality of gait 02/23/2013  . Hx of Clostridium difficile infection   . Adenocarcinoma of right lung 02/24/2011    Ct A/P 2012:  2cm lung mass RLL PET 1017:  Hypermetabolic RLL mass, no other hypermetabolic areas. TTNA 02/2011:  Adenocarcinoma, markers c/w lung origin Right lower lobe superior segmentectomy. 04/01/2011 Dr. Arlyce Dice     Past Surgical History  Procedure Laterality Date   . Transurethral resection of prostate    . Cystectomy    . Tonsillectomy    . Colonoscopy  03/19/2010    Dr. Gala Romney -(poor prep) Anal papilla, rectal hyperplastic polyp, tubular adenoma removed splenic flexure, left-sided diverticula  . Wedge resection  04/2011    carcinoma of lung  . Colonoscopy  11/28/2004    RMR:  Diminutive rectal and left colon polyps as described above, cold  biopsied/removed/  Left sided diverticula. The remainder of the colonic mucosa appeared normal.  . Colonoscopy   09/15/01    RMR: Multiple diminutive polyps destroyed with dermolysis as described above/ Multiple small polyps on stalks in the colon resected with snare cautery/ Scattered pan colonic diverticulum/ The remainder of the colonic mucosa appeared normal  . Abdominal aortic aneurysm repair  2004  . Cardiac catheterization    . Lung lobectomy      Family History  Problem Relation Age of Onset  . Aortic aneurysm Mother   . Emphysema Father     smoker  . Clotting disorder Father   . Arthritis Father   . Hypertension Father   . Diabetes Father   . Aortic aneurysm Father   . Tremor Father   . Stroke Paternal Grandmother   . Other Paternal Grandfather     brain aneurysm    Social History History  Substance Use Topics  . Smoking status: Former Smoker -- 1.00 packs/day for 50 years    Types: Cigarettes    Quit date: 12/19/2012  . Smokeless tobacco: Never Used  . Alcohol Use: No    Current Outpatient Prescriptions  Medication Sig Dispense Refill  . ACCU-CHEK AVIVA PLUS test strip       . ACCU-CHEK SOFTCLIX LANCETS lancets       . acetaminophen (TYLENOL) 500 MG tablet Take 500 mg by mouth every 6 (six) hours as needed.        Marland Kitchen albuterol (PROAIR HFA) 108 (90 BASE) MCG/ACT inhaler Inhale 2 puffs into the lungs every 6 (six) hours as needed for wheezing.  1 Inhaler  1  . ALPRAZolam (XANAX) 0.5 MG tablet Take 0.5 mg by mouth 2 (two) times daily as needed.  60 tablet  5  . aspirin EC 81 MG tablet  81 mg. Take 81 mg by mouth daily.      . Blood Glucose Monitoring Suppl (ACCU-CHEK AVIVA PLUS) W/DEVICE KIT       . cetirizine (ZYRTEC) 10 MG tablet Take 10 mg by mouth daily.       . citalopram (CELEXA) 20 MG tablet TAKE ONE TABLET BY MOUTH ONCE DAILY  90 tablet  1  . ferrous sulfate (FERROUSUL) 325 (65 FE) MG tablet Take 325 mg by mouth daily with breakfast.        . fluticasone (FLONASE) 50 MCG/ACT nasal spray Place 2 sprays into both nostrils daily as needed.      Marland Kitchen  glipiZIDE (GLUCOTROL XL) 2.5 MG 24 hr tablet Take 1 tablet (2.5 mg total) by mouth daily with breakfast.  30 tablet  5  . lisinopril (PRINIVIL,ZESTRIL) 2.5 MG tablet Take 2.5 mg by mouth daily.      . metoprolol (LOPRESSOR) 50 MG tablet Take 1 tablet (50 mg total) by mouth 2 (two) times daily.  180 tablet  1  . niacin (NIASPAN) 500 MG CR tablet TAKE TWO TABLETS BY MOUTH ONCE DAILY AT BEDTIME  180 tablet  1  . nitroGLYCERIN (NITROSTAT) 0.4 MG SL tablet Place 1 tablet (0.4 mg total) under the tongue as needed. Place 0.4 mg under the tongue every 5 (five) minutes as needed. Call MD if need more than 2  3 tablet  5  . pravastatin (PRAVACHOL) 80 MG tablet TAKE ONE TABLET BY MOUTH ONCE DAILY  90 tablet  1  . tiotropium (SPIRIVA) 18 MCG inhalation capsule Place 1 capsule (18 mcg total) into inhaler and inhale daily.  90 capsule  4  . Vitamin D, Ergocalciferol, (DRISDOL) 50000 UNITS CAPS capsule TAKE ONE CAPSULE BY MOUTH MONTHLY  3 capsule  1  . [DISCONTINUED] budesonide-formoterol (SYMBICORT) 160-4.5 MCG/ACT inhaler Inhale 2 puffs into the lungs 2 (two) times daily.         No current facility-administered medications for this visit.    Allergies  Allergen Reactions  . Codeine Anaphylaxis  . Etodolac     dizziness    Review of Systems  Constitutional: Positive for activity change and fatigue. Negative for fever and chills.  HENT: Positive for hearing loss.   Respiratory: Positive for apnea (uses CPAP), cough (Productive, clear  mucus) and shortness of breath ("gives out"with exertion).        Home oxygen 2.5 L per minute at night  Cardiovascular: Positive for leg swelling. Negative for chest pain.       History of 2 heart attacks  Genitourinary: Positive for dysuria and frequency.  Neurological:       Memory loss  Hematological: Bruises/bleeds easily (history of ITP).  Psychiatric/Behavioral: The patient is nervous/anxious.   All other systems reviewed and are negative.    BP 124/74  Pulse 72  Resp 20  Ht 6' (1.829 m)  Wt 270 lb (122.471 kg)  BMI 36.61 kg/m2  SpO2 94% Physical Exam  Vitals reviewed. Constitutional: He is oriented to person, place, and time. No distress.  Obese  HENT:  Head: Normocephalic and atraumatic.  Eyes: Pupils are equal, round, and reactive to light.  Neck: Neck supple. No thyromegaly present.  Cardiovascular: Normal rate and regular rhythm.   No murmur heard. Pulmonary/Chest:  Diminished breath sounds bilaterally, faint crackles at bases  Abdominal: Soft.  Large midline ventral hernia, smaller hernia superior and to the right  Musculoskeletal: He exhibits edema.  Lymphadenopathy:    He has no cervical adenopathy.  Neurological: He is alert and oriented to person, place, and time.  Hard of hearing, no focal motor deficit  Skin: Skin is warm and dry.     Diagnostic Tests: CT CHEST WITHOUT CONTRAST 07/18/2014 TECHNIQUE:  Multidetector CT imaging of the chest was performed following the  standard protocol without IV contrast.  COMPARISON: CT CHEST W/O CM dated 06/15/2013; NM PET IMAGE RESTAG  (PS) SKULL BASE TO THIGH dated 03/16/2013; CT CHEST W/O CM dated  12/01/2011; NM PET IMAGE INITIAL (PI) SKULL BASE TO THIGH dated  02/17/2011  FINDINGS:  Subcarinal node 1.1 cm, image 28 of series 2, formerly 1.2 cm.  Coronary artery atherosclerosis noted. Increased volume loss along  the wedge resection/lobectomy site at the right posterior lung base.  Airway plugging is present  in this vicinity.  Stable 2 mm subpleural nodule, image 29 of series 3, no change from  02/17/2011, considered benign and inconsequential.  Mild chronic lingular scarring on images 32-33 of series 3.  Ventral upper abdominal hernia contains transverse colon and  omentum.  Stable 3 cm right adrenal adenoma, no change from 2007. Fusiform  aneurysm approximately at the level of the renal arteries, 4.6 cm  transverse by 4.0 cm anterior-posterior.  Dependent gallstones in the gallbladder.  Thoracic spondylosis with multilevel bridging anterior spurring.  IMPRESSION:  1. Progressive volume loss medially at the right lung base, likely  attributable to the airway plugging in this vicinity. Differential  diagnostic considerations include mucus plugging, radiation therapy  related findings, or progressive bronchogenic tumor causing  increasing atelectasis. Bronchoscopy or PET-CT could be utilized for  further characterization.  2. Atherosclerosis.  3. Ventral upper abdominal hernia containing transverse colon and  omentum.  4. Other incidental findings include chronic right adrenal adenoma,  thoracic spondylosis, fusiform abdominal aortic aneurysm, and  gallstones within the gallbladder.  Electronically Signed  By: Sherryl Barters M.D.  On: 11/13/202015 10:47   PET/CT 10/03/2013 NUCLEAR MEDICINE PET SKULL BASE TO THIGH  TECHNIQUE:  14.0 mCi F-18 FDG was injected intravenously. Full-ring PET imaging  was performed from the skull base to thigh after the radiotracer. CT  data was obtained and used for attenuation correction and anatomic  localization.  FASTING BLOOD GLUCOSE: Value: 113 mg/dl  COMPARISON: 03/16/2013  FINDINGS:  NECK  No hypermetabolic lymph nodes in the neck.  CHEST  The area of postoperative change is stable when compared to the  recent CT scan. It had improved in appearance since the prior CT  from 06/15/2013. Low level FDG uptake is noted with SUV max of 3.1.  This is  not unexpected given the amount of atelectasis. I do not see  any findings worrisome or suspicious for residual or recurrent  tumor. No metastatic pulmonary nodules are identified. No  mediastinal or hilar mass or adenopathy.  ABDOMEN/PELVIS  No abnormal hypermetabolic activity within the liver, pancreas,  adrenal glands, or spleen. No hypermetabolic lymph nodes in the  abdomen or pelvis. Stable anterior abdominal wall hernia containing  the transverse colon. Stable large renal cysts and gallstones.  Stable right adrenal gland adenoma.  Mild diffuse bladder wall thickening is noted along with a small  amount of air in the bladder. This may be from recent  instrumentation.  SKELETON  No focal hypermetabolic activity to suggest skeletal metastasis.  IMPRESSION:  1. Low level residual FDG uptake in the area of previous surgery  with moderate atelectasis. I do not see any findings suspicious for  recurrent or residual tumor. Recommend continued CT surveillance.  2. No findings for metastatic disease.  Electronically Signed  By: Kalman Jewels M.D.  On: 10/03/2013 11:47  Impression: 80 year old gentleman with a history of a stage IA non-small cell carcinoma resected with a right lower lobe superior segmentectomy by Dr. Arlyce Dice 2012. He now has persistent volume loss in the right lower lobe on CT. This has waxed and waned somewhat over time. PET/CT shows some generalized hypermetabolic activity within SUV max of 3.1. There is no definite evidence of recurrent tumor, but scans cannot completely rule out possibility.  In any event given that this atelectasis has persisted, I think bronchoscopy is warranted. We  will plan to evaluate the airways and see if there is a mechanical issue that is preventing the atelectatic lung from being aerated. If we see any suspicious lesions we will plan to biopsy them.  I do think given the potential complexity of the bronchoscopy and his history of ITP that this  would best be done in the operating room under general anesthesia. We'll plan to use a electromagnetic navigational mapping to assist with the bronchoscopy.  I discussed with the patient and his wife the indications, risks, benefits, and alternatives. They understand that the risks include those related general anesthesia as well as procedure specific risks of bleeding, pneumothorax, nondiagnostic biopsies. He understands and accepts the risks and wishes to proceed.  Plan: Electromagnetic navigational bronchoscopy on Monday, March 23. We will plan to do this as an outpatient procedure.

## 2013-10-06 ENCOUNTER — Encounter (HOSPITAL_COMMUNITY): Payer: Self-pay

## 2013-10-06 ENCOUNTER — Encounter (HOSPITAL_COMMUNITY)
Admission: RE | Admit: 2013-10-06 | Discharge: 2013-10-06 | Disposition: A | Discharge status: Home / Self Care | Payer: Medicare Other | Source: Ambulatory Visit | Attending: Thoracic Surgery (Cardiothoracic Vascular Surgery) | Admitting: Thoracic Surgery (Cardiothoracic Vascular Surgery)

## 2013-10-06 VITALS — HR 56 | Temp 97.5°F | Ht 72.0 in | Wt 270.0 lb

## 2013-10-06 DIAGNOSIS — Z01810 Encounter for preprocedural cardiovascular examination: Secondary | ICD-10-CM | POA: Insufficient documentation

## 2013-10-06 DIAGNOSIS — J9811 Atelectasis: Secondary | ICD-10-CM

## 2013-10-06 DIAGNOSIS — Z01812 Encounter for preprocedural laboratory examination: Secondary | ICD-10-CM | POA: Insufficient documentation

## 2013-10-06 HISTORY — DX: Cough: R05

## 2013-10-06 HISTORY — DX: Cough, unspecified: R05.9

## 2013-10-06 HISTORY — DX: Acute myocardial infarction, unspecified: I21.9

## 2013-10-06 LAB — URINE MICROSCOPIC-ADD ON

## 2013-10-06 LAB — COMPREHENSIVE METABOLIC PANEL
ALK PHOS: 59 U/L (ref 39–117)
ALT: 11 U/L (ref 0–53)
AST: 13 U/L (ref 0–37)
Albumin: 3.6 g/dL (ref 3.5–5.2)
BUN: 35 mg/dL — ABNORMAL HIGH (ref 6–23)
CO2: 25 meq/L (ref 19–32)
Calcium: 9.2 mg/dL (ref 8.4–10.5)
Chloride: 107 mEq/L (ref 96–112)
Creatinine, Ser: 1.88 mg/dL — ABNORMAL HIGH (ref 0.50–1.35)
GFR, EST AFRICAN AMERICAN: 38 mL/min — AB (ref >90)
GFR, EST NON AFRICAN AMERICAN: 33 mL/min — AB (ref >90)
Glucose, Bld: 150 mg/dL — ABNORMAL HIGH (ref 70–99)
POTASSIUM: 4.9 meq/L (ref 3.7–5.3)
SODIUM: 144 meq/L (ref 137–147)
Total Bilirubin: 0.8 mg/dL (ref 0.3–1.2)
Total Protein: 6.8 g/dL (ref 6.0–8.3)

## 2013-10-06 LAB — CBC
HCT: 44.4 % (ref 39.0–52.0)
HEMOGLOBIN: 14.9 g/dL (ref 13.0–17.0)
MCH: 31.6 pg (ref 26.0–34.0)
MCHC: 33.6 g/dL (ref 30.0–36.0)
MCV: 94.3 fL (ref 78.0–100.0)
Platelets: 104 10*3/uL — ABNORMAL LOW (ref 150–400)
RBC: 4.71 MIL/uL (ref 4.22–5.81)
RDW: 16.7 % — ABNORMAL HIGH (ref 11.5–15.5)
WBC: 6.4 10*3/uL (ref 4.0–10.5)

## 2013-10-06 LAB — PROTIME-INR
INR: 1.09 (ref 0.00–1.49)
Prothrombin Time: 13.9 seconds (ref 11.6–15.2)

## 2013-10-06 LAB — URINALYSIS, ROUTINE W REFLEX MICROSCOPIC
Bilirubin Urine: NEGATIVE
GLUCOSE, UA: NEGATIVE mg/dL
KETONES UR: NEGATIVE mg/dL
Nitrite: NEGATIVE
PH: 5.5 (ref 5.0–8.0)
PROTEIN: 100 mg/dL — AB
Specific Gravity, Urine: 1.018 (ref 1.005–1.030)
Urobilinogen, UA: 1 mg/dL (ref 0.0–1.0)

## 2013-10-06 LAB — APTT: APTT: 32 s (ref 24–37)

## 2013-10-06 NOTE — Progress Notes (Signed)
Levonne Spiller, RN made aware that patient feels like he has an urinary infection, she gave an verbal order to get a UA

## 2013-10-06 NOTE — Pre-Procedure Instructions (Signed)
ASTIN SAYRE  10/06/2013   Your procedure is scheduled on:  Monday October 10, 2013  Report to [facility] Short Stay Main Entrance"A" at 0830 AM.  Call this number if you have problems the morning of surgery: [phone]   Remember:   Do not eat food or drink liquids after midnight.   Take these medicines the morning of surgery with A SIP OF WATER: Tylenol if needed for pain, Xanax if needed for anxiety, Zyrtec, Celexa, Metoprolol,and use and bring inhalers with you day of surgery. Nitroglycerin if needed for chest pain   Do not wear jewelry,   Do not wear lotions, powders, or cologne. You may NOT wear deodorant.             Men may shave face and neck.  Do not bring valuables to the hospital.  [facility] is not responsible for any belongings or valuables.               Contacts, dentures or bridgework may not be worn into surgery.  Leave suitcase in the car. After surgery it may be brought to your room.  For patients admitted to the hospital, discharge time is determined by your  treatment team.               Patients discharged the day of surgery will not be allowed to drive home.  Name and phone number of your driver:   Special Instructions: [facility] - Preparing for Surgery  Before surgery, you can play an important role.  Because skin is not sterile, your skin needs to be as free of germs as possible.  You can reduce the number of germs on you skin by washing with CHG (chlorahexidine gluconate) soap before surgery.  CHG is an antiseptic cleaner which kills germs and bonds with the skin to continue killing germs even after washing.  Please DO NOT use if you have an allergy to CHG or antibacterial soaps.  If your skin becomes reddened/irritated stop using the CHG and inform your nurse when you arrive at Short Stay.  Do not shave (including legs and underarms) for at least 48 hours prior to the first CHG shower.  You may shave your face.  Please follow these instructions  carefully:   1.  Shower with CHG Soap the night before surgery and the                                morning of Surgery.  2.  If you choose to wash your hair, wash your hair first as usual with your       normal shampoo.  3.  After you shampoo, rinse your hair and body thoroughly to remove the                      Shampoo.  4.  Use CHG as you would any other liquid soap.  You can apply chg directly       to the skin and wash gently with scrungie or a clean washcloth.  5.  Apply the CHG Soap to your body ONLY FROM THE NECK DOWN.        Do not use on open wounds or open sores.  Avoid contact with your eyes,       ears, mouth and genitals (private parts).  Wash genitals (private parts)       with your normal soap.  6.  Wash thoroughly, paying special attention to the area where your surgery        will be performed.  7.  Thoroughly rinse your body with warm water from the neck down.  8.  DO NOT shower/wash with your normal soap after using and rinsing off       the CHG Soap.  9.  Pat yourself dry with a clean towel.            10.  Wear clean pajamas.            11.  Place clean sheets on your bed the night of your first shower and do not        sleep with pets.  Day of Surgery  Do not apply any lotions/deoderants the morning of surgery.  Please wear clean clothes to the hospital/surgery center.      Please read over the following fact sheets that you were given: Pain Booklet, Coughing and Deep Breathing and Surgical Site Infection Prevention

## 2013-10-07 ENCOUNTER — Other Ambulatory Visit: Payer: Self-pay | Admitting: Family Medicine

## 2013-10-07 NOTE — Progress Notes (Signed)
Anesthesia Chart Review:  Patient is a 80 year old male scheduled for video bronchoscopy with endobronchial navigation on 10/10/13 by Dr. Roxan Hockey. He has a history of stage IA [place] lung cancer s/p RLL segmentectomy '12 and recent films show persistent volume loss in the RLL and PET showing generalized hypermetabolic activity with SUV max of 3.1.  Bronchoscopy was recommended for definitive diagnosis.  History includes former smoker (50 pack year, quit in 2012), COPD, OSA with CPAP at 2.5L/[place] at night, CAD diagnosed '70's with NSTEMI 12/20/10 due to small distal RCA/PL lesion treated medically '12, ITP, DM2, AAA repair '04, stage 1A [place] lung cancer (adenocarcinoma) s/p RLL superior segmentectomy '12, bladder cancer '96 s/p cystectomy with recent recurrence (Dr. Rosana Hoes) , CKD stage III, BPH s/p TURP, tremor and diplopia (Dr. Jannifer Franklin). BMI is 36.6 consistent with obesity. Pulmonologist is Dr. Gwenette Greet. PCP is Dr. Grace Bushy. Luking. Cardiologist is Dr. Bronson Ing (previously Dr. Lattie Haw), with last visit being with Jory Sims, NP on 08/25/13. According to her CAD A/P: "He is currently asymptomatic from cardiac standpoint. He remains medically compliant. He has not had used nitroglycerin. He will continue medical management and risk management and will remain on statin and beta blocker. Will see him again in one year unless he becomes symptomatic or is in need of surgical clearance in the setting of recently diagnosed recurrence of bladder cancer."  Cardiac cath on 12/19/10 showed: 20% ostial LM, 50% mid to distal LAD, 70% proximal D1. RCA was a very large dominant vessel,  It was aneurysmal throughout most of the mid and distal section with serial 50% lesions around the mid to distal bend.  There was mild plaquing in the PDA.  PL1 appeared totally occluded with some late filling from right to right collaterals, 99% stenosis in the distal RCA ostium of the PL2. 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 The distal RCA/PL system was felt to be the culprit lesion but was small and not amenable to PCI. The LCX stenosis did not appear critical, so medical therapy was recommended.  If medical therapy failed consideration to LCX PCI would be considered.   Echo on 12/20/10 showed: LVEF is normal with lateral wall hypokinesis. The cavity size was normal. Wall thickness was increased in a pattern of severe LVH. LV systolic function was normal with ejection fraction 55-60%. Doppler parameters consistent with abnormal left ventricular relaxation, grade 1 diastolic dysfunction. Mild mitral regurgitation. Right ventricular systolic function is mildly reduced. Pulmonary artery PA peak pressure 44 mmHg.  EKG on 10/06/13 showed SR with first degree AVB, LAD, baseline wander artifact.  Rate slower when compared to previous tracing on 01/31/13.  Preoperative labs noted.  BUN/Cr 35/1.88 which appears within his baseline. Glucose 150, H/H WNL, PLT 104K. PT/PTT WNL. Urine culture pending.  He is for a CXR on the day of surgery.  I reviewed above with anesthesiologist Dr. Ermalene Postin who recommending contacting cardiology regarding plans for surgery.  I called and spoke with Jory Sims, NP with [facility].  She saw patient last month.  She reviewed records and patient's plans for bronchoscopy. IF no acute changes, she did not feel that he would need to undergo additional cardiology testing prior to thiis procedure; however if VATS/lobectomy is planned in the future she would recommend further cardiology input to consider if follow-up functional testing was warranted then.  I will send Dr. Roxan Hockey  a staff message updating him of this.  George Hugh [facility] Short Stay Center/Anesthesiology Phone [phone] 10/07/2013 12:34 PM

## 2013-10-08 LAB — URINE CULTURE: Colony Count: >=100000

## 2013-10-10 ENCOUNTER — Encounter (HOSPITAL_COMMUNITY): Payer: Self-pay | Admitting: Surgery

## 2013-10-10 ENCOUNTER — Ambulatory Visit (HOSPITAL_COMMUNITY)
Admission: RE | Admit: 2013-10-10 | Discharge: 2013-10-10 | Disposition: A | Discharge status: Home / Self Care | Payer: Medicare Other | Source: Ambulatory Visit | Attending: Thoracic Surgery (Cardiothoracic Vascular Surgery) | Admitting: Thoracic Surgery (Cardiothoracic Vascular Surgery)

## 2013-10-10 ENCOUNTER — Ambulatory Visit (HOSPITAL_COMMUNITY): Payer: Medicare Other | Admitting: Anesthesiology

## 2013-10-10 ENCOUNTER — Encounter (HOSPITAL_COMMUNITY): Payer: Medicare Other | Admitting: Vascular Surgery

## 2013-10-10 ENCOUNTER — Ambulatory Visit (HOSPITAL_COMMUNITY): Payer: Medicare Other

## 2013-10-10 ENCOUNTER — Encounter (HOSPITAL_COMMUNITY)
Admission: RE | Disposition: A | Discharge status: Home / Self Care | Payer: Self-pay | Source: Ambulatory Visit | Attending: Thoracic Surgery (Cardiothoracic Vascular Surgery)

## 2013-10-10 DIAGNOSIS — E1142 Type 2 diabetes mellitus with diabetic polyneuropathy: Secondary | ICD-10-CM | POA: Insufficient documentation

## 2013-10-10 DIAGNOSIS — H269 Unspecified cataract: Secondary | ICD-10-CM | POA: Insufficient documentation

## 2013-10-10 DIAGNOSIS — Z860100 Personal history of colon polyps, unspecified: Secondary | ICD-10-CM | POA: Insufficient documentation

## 2013-10-10 DIAGNOSIS — N400 Enlarged prostate without lower urinary tract symptoms: Secondary | ICD-10-CM | POA: Insufficient documentation

## 2013-10-10 DIAGNOSIS — Z6836 Body mass index (BMI) 36.0-36.9, adult: Secondary | ICD-10-CM | POA: Insufficient documentation

## 2013-10-10 DIAGNOSIS — D693 Immune thrombocytopenic purpura: Secondary | ICD-10-CM | POA: Insufficient documentation

## 2013-10-10 DIAGNOSIS — Z87891 Personal history of nicotine dependence: Secondary | ICD-10-CM | POA: Insufficient documentation

## 2013-10-10 DIAGNOSIS — R269 Unspecified abnormalities of gait and mobility: Secondary | ICD-10-CM | POA: Insufficient documentation

## 2013-10-10 DIAGNOSIS — G250 Essential tremor: Secondary | ICD-10-CM | POA: Insufficient documentation

## 2013-10-10 DIAGNOSIS — J9819 Other pulmonary collapse: Secondary | ICD-10-CM | POA: Insufficient documentation

## 2013-10-10 DIAGNOSIS — I739 Peripheral vascular disease, unspecified: Secondary | ICD-10-CM | POA: Insufficient documentation

## 2013-10-10 DIAGNOSIS — E1149 Type 2 diabetes mellitus with other diabetic neurological complication: Secondary | ICD-10-CM | POA: Insufficient documentation

## 2013-10-10 DIAGNOSIS — K5730 Diverticulosis of large intestine without perforation or abscess without bleeding: Secondary | ICD-10-CM | POA: Insufficient documentation

## 2013-10-10 DIAGNOSIS — Z85118 Personal history of other malignant neoplasm of bronchus and lung: Secondary | ICD-10-CM | POA: Insufficient documentation

## 2013-10-10 DIAGNOSIS — J9811 Atelectasis: Secondary | ICD-10-CM

## 2013-10-10 DIAGNOSIS — I129 Hypertensive chronic kidney disease with stage 1 through stage 4 chronic kidney disease, or unspecified chronic kidney disease: Secondary | ICD-10-CM | POA: Insufficient documentation

## 2013-10-10 DIAGNOSIS — Z8551 Personal history of malignant neoplasm of bladder: Secondary | ICD-10-CM | POA: Insufficient documentation

## 2013-10-10 DIAGNOSIS — J4489 Other specified chronic obstructive pulmonary disease: Secondary | ICD-10-CM | POA: Insufficient documentation

## 2013-10-10 DIAGNOSIS — I252 Old myocardial infarction: Secondary | ICD-10-CM | POA: Insufficient documentation

## 2013-10-10 DIAGNOSIS — I2510 Atherosclerotic heart disease of native coronary artery without angina pectoris: Secondary | ICD-10-CM | POA: Insufficient documentation

## 2013-10-10 DIAGNOSIS — J449 Chronic obstructive pulmonary disease, unspecified: Secondary | ICD-10-CM | POA: Insufficient documentation

## 2013-10-10 DIAGNOSIS — E785 Hyperlipidemia, unspecified: Secondary | ICD-10-CM | POA: Insufficient documentation

## 2013-10-10 DIAGNOSIS — Z9981 Dependence on supplemental oxygen: Secondary | ICD-10-CM | POA: Insufficient documentation

## 2013-10-10 DIAGNOSIS — G4733 Obstructive sleep apnea (adult) (pediatric): Secondary | ICD-10-CM | POA: Insufficient documentation

## 2013-10-10 DIAGNOSIS — G252 Other specified forms of tremor: Secondary | ICD-10-CM

## 2013-10-10 DIAGNOSIS — N189 Chronic kidney disease, unspecified: Secondary | ICD-10-CM | POA: Insufficient documentation

## 2013-10-10 DIAGNOSIS — Z7982 Long term (current) use of aspirin: Secondary | ICD-10-CM | POA: Insufficient documentation

## 2013-10-10 HISTORY — PX: VIDEO BRONCHOSCOPY WITH ENDOBRONCHIAL NAVIGATION: SHX6175

## 2013-10-10 LAB — GLUCOSE, CAPILLARY
GLUCOSE-CAPILLARY: 122 mg/dL — AB (ref 70–99)
GLUCOSE-CAPILLARY: 97 mg/dL (ref 70–99)
GLUCOSE-CAPILLARY: 89 mg/dL (ref 70–99)
Glucose-Capillary: 111 mg/dL — ABNORMAL HIGH (ref 70–99)
Glucose-Capillary: 112 mg/dL — ABNORMAL HIGH (ref 70–99)
Glucose-Capillary: 87 mg/dL (ref 70–99)

## 2013-10-10 SURGERY — VIDEO BRONCHOSCOPY WITH ENDOBRONCHIAL NAVIGATION
Anesthesia: General | Site: Chest

## 2013-10-10 MED ORDER — FENTANYL CITRATE 0.05 MG/ML IJ SOLN
INTRAMUSCULAR | Status: DC | PRN
  Administered 2013-10-10: 100 ug via INTRAVENOUS
  Administered 2013-10-10: 50 ug via INTRAVENOUS

## 2013-10-10 MED ORDER — ONDANSETRON HCL 4 MG/2ML IJ SOLN
INTRAMUSCULAR | Status: DC | PRN
  Administered 2013-10-10: 4 mg via INTRAVENOUS

## 2013-10-10 MED ORDER — EPINEPHRINE HCL 1 MG/ML IJ SOLN
INTRAMUSCULAR | Status: DC | PRN
  Administered 2013-10-10: 1 mg

## 2013-10-10 MED ORDER — LACTATED RINGERS IV SOLN
INTRAVENOUS | Status: DC | PRN
  Administered 2013-10-10: 18:00:00 via INTRAVENOUS

## 2013-10-10 MED ORDER — LIDOCAINE HCL (CARDIAC) 20 MG/ML IV SOLN
INTRAVENOUS | Status: AC
  Filled 2013-10-10: qty 5

## 2013-10-10 MED ORDER — ROCURONIUM BROMIDE 50 MG/5ML IV SOLN
INTRAVENOUS | Status: AC
  Filled 2013-10-10: qty 1

## 2013-10-10 MED ORDER — PROPOFOL 10 MG/ML IV BOLUS
INTRAVENOUS | Status: DC | PRN
  Administered 2013-10-10: 50 mg via INTRAVENOUS
  Administered 2013-10-10: 200 mg via INTRAVENOUS

## 2013-10-10 MED ORDER — SUCCINYLCHOLINE CHLORIDE 20 MG/ML IJ SOLN
INTRAMUSCULAR | Status: AC
  Filled 2013-10-10: qty 1

## 2013-10-10 MED ORDER — PHENYLEPHRINE HCL 10 MG/ML IJ SOLN
10.0000 mg | INTRAVENOUS | Status: DC | PRN
  Administered 2013-10-10: 40 ug/min via INTRAVENOUS

## 2013-10-10 MED ORDER — GLYCOPYRROLATE 0.2 MG/ML IJ SOLN
INTRAMUSCULAR | Status: DC | PRN
  Administered 2013-10-10: .8 mg via INTRAVENOUS

## 2013-10-10 MED ORDER — ONDANSETRON HCL 4 MG/2ML IJ SOLN: 4.0000 mg | Freq: Once | INTRAMUSCULAR | Status: DC | PRN

## 2013-10-10 MED ORDER — FENTANYL CITRATE 0.05 MG/ML IJ SOLN
INTRAMUSCULAR | Status: AC
  Filled 2013-10-10: qty 5

## 2013-10-10 MED ORDER — 0.9 % SODIUM CHLORIDE (POUR BTL) OPTIME
TOPICAL | Status: DC | PRN
  Administered 2013-10-10: 1000 mL

## 2013-10-10 MED ORDER — LACTATED RINGERS IV SOLN: INTRAVENOUS | Status: DC | PRN

## 2013-10-10 MED ORDER — HYDROMORPHONE HCL PF 1 MG/ML IJ SOLN: 0.2500 mg | INTRAMUSCULAR | Status: DC | PRN

## 2013-10-10 MED ORDER — EPHEDRINE SULFATE 50 MG/ML IJ SOLN
INTRAMUSCULAR | Status: AC
  Filled 2013-10-10: qty 1

## 2013-10-10 MED ORDER — PROPOFOL 10 MG/ML IV BOLUS
INTRAVENOUS | Status: AC
  Filled 2013-10-10: qty 20

## 2013-10-10 MED ORDER — ONDANSETRON HCL 4 MG/2ML IJ SOLN
INTRAMUSCULAR | Status: AC
  Filled 2013-10-10: qty 2

## 2013-10-10 MED ORDER — SODIUM CHLORIDE 0.9 % IV SOLN
INTRAVENOUS | Status: DC
  Administered 2013-10-10: 16:00:00 via INTRAVENOUS

## 2013-10-10 MED ORDER — NEOSTIGMINE METHYLSULFATE 1 MG/ML IJ SOLN
INTRAMUSCULAR | Status: AC
  Filled 2013-10-10: qty 30

## 2013-10-10 MED ORDER — SODIUM CHLORIDE 0.9 % IJ SOLN
INTRAMUSCULAR | Status: AC
  Filled 2013-10-10: qty 10

## 2013-10-10 MED ORDER — MIDAZOLAM HCL 2 MG/2ML IJ SOLN
INTRAMUSCULAR | Status: AC
  Filled 2013-10-10: qty 2

## 2013-10-10 MED ORDER — LIDOCAINE HCL (CARDIAC) 20 MG/ML IV SOLN
INTRAVENOUS | Status: DC | PRN
  Administered 2013-10-10: 70 mg via INTRAVENOUS

## 2013-10-10 MED ORDER — METOPROLOL TARTRATE 50 MG PO TABS
50.0000 mg | ORAL_TABLET | Freq: Once | ORAL | Status: DC
  Filled 2013-10-10: qty 1

## 2013-10-10 MED ORDER — PHENYLEPHRINE 40 MCG/ML (10ML) SYRINGE FOR IV PUSH (FOR BLOOD PRESSURE SUPPORT)
PREFILLED_SYRINGE | INTRAVENOUS | Status: AC
  Filled 2013-10-10: qty 10

## 2013-10-10 MED ORDER — ROCURONIUM BROMIDE 100 MG/10ML IV SOLN
INTRAVENOUS | Status: DC | PRN
  Administered 2013-10-10: 20 mg via INTRAVENOUS
  Administered 2013-10-10: 10 mg via INTRAVENOUS

## 2013-10-10 MED ORDER — SUCCINYLCHOLINE CHLORIDE 20 MG/ML IJ SOLN
INTRAMUSCULAR | Status: DC | PRN
  Administered 2013-10-10: 120 mg via INTRAVENOUS

## 2013-10-10 MED ORDER — LIDOCAINE HCL 4 % MT SOLN
OROMUCOSAL | Status: DC | PRN
  Administered 2013-10-10: 3 mL via TOPICAL

## 2013-10-10 MED ORDER — NEOSTIGMINE METHYLSULFATE 1 MG/ML IJ SOLN
INTRAMUSCULAR | Status: DC | PRN
  Administered 2013-10-10: 4 mg via INTRAVENOUS

## 2013-10-10 MED ORDER — SODIUM CHLORIDE 0.9 % IV SOLN
INTRAVENOUS | Status: DC | PRN
  Administered 2013-10-10: 17:00:00 via INTRAVENOUS

## 2013-10-10 SURGICAL SUPPLY — 39 items
BRUSH CYTOL CELLEBRITY 1.5X140 (MISCELLANEOUS) ×2 IMPLANT
BRUSH SUPERTRAX BIOPSY (INSTRUMENTS) IMPLANT
BRUSH SUPERTRAX NDL-TIP CYTO (INSTRUMENTS) IMPLANT
CANISTER SUCTION 2500CC (MISCELLANEOUS) ×3 IMPLANT
CHANNEL WORK EXTEND EDGE 180 (KITS) IMPLANT
CHANNEL WORK EXTEND EDGE 45 (KITS) IMPLANT
CHANNEL WORK EXTEND EDGE 90 (KITS) ×2 IMPLANT
CONT SPEC 4OZ CLIKSEAL STRL BL (MISCELLANEOUS) ×10 IMPLANT
COVER TABLE BACK 60X90 (DRAPES) ×3 IMPLANT
FILTER STRAW FLUID ASPIR (MISCELLANEOUS) ×2 IMPLANT
FORCEPS BIOP SUPERTRAX (INSTRUMENTS) ×2 IMPLANT
FORCEPS BIOP SUPERTRX PREMAR (INSTRUMENTS) ×2 IMPLANT
FORCEPS RADIAL JAW LRG 4 PULM (INSTRUMENTS) IMPLANT
GLOVE BIO SURGEON STRL SZ 6.5 (GLOVE) ×1 IMPLANT
GLOVE BIO SURGEONS STRL SZ 6.5 (GLOVE) ×1
GLOVE SURG SIGNA 7.5 PF LTX (GLOVE) ×3 IMPLANT
GOWN STRL REUS W/ TWL XL LVL3 (GOWN DISPOSABLE) ×1 IMPLANT
GOWN STRL REUS W/TWL XL LVL3 (GOWN DISPOSABLE) ×6
KIT PROCEDURE EDGE 180 (KITS) IMPLANT
KIT PROCEDURE EDGE 45 (KITS) IMPLANT
KIT PROCEDURE EDGE 90 (KITS) ×2 IMPLANT
KIT ROOM TURNOVER OR (KITS) ×3 IMPLANT
MARKER SKIN DUAL TIP RULER LAB (MISCELLANEOUS) ×3 IMPLANT
NDL SUPERTRX PREMARK BIOPSY (NEEDLE) IMPLANT
NEEDLE SUPERTRX PREMARK BIOPSY (NEEDLE) IMPLANT
NS IRRIG 1000ML POUR BTL (IV SOLUTION) ×3 IMPLANT
OIL SILICONE PENTAX (PARTS (SERVICE/REPAIRS)) ×3 IMPLANT
PAD ARMBOARD 7.5X6 YLW CONV (MISCELLANEOUS) ×6 IMPLANT
PATCHES PATIENT (LABEL) ×9 IMPLANT
RADIAL JAW LRG 4 PULMONARY (INSTRUMENTS) ×2
SPONGE GAUZE 4X4 12PLY (GAUZE/BANDAGES/DRESSINGS) ×3 IMPLANT
SYR 20CC LL (SYRINGE) ×5 IMPLANT
SYR 20ML ECCENTRIC (SYRINGE) ×3 IMPLANT
SYR 30ML LL (SYRINGE) ×3 IMPLANT
SYR 5ML LL (SYRINGE) ×3 IMPLANT
TOWEL OR 17X24 6PK STRL BLUE (TOWEL DISPOSABLE) ×3 IMPLANT
TRAP SPECIMEN MUCOUS 40CC (MISCELLANEOUS) ×5 IMPLANT
TUBE CONNECTING 12'X1/4 (SUCTIONS) ×2
TUBE CONNECTING 12X1/4 (SUCTIONS) ×4 IMPLANT

## 2013-10-10 NOTE — Progress Notes (Signed)
Radial pulse obtained for one full minute and it was 56. Will hold Metoprolol at this time and recheck pulse at a later time. Patient and son made aware of this. Will continue to monitor.

## 2013-10-10 NOTE — Brief Op Note (Addendum)
10/10/2013  6:50 PM  PATIENT:  [name]  80 y.o. male  PRE-OPERATIVE DIAGNOSIS:  RIGHT LOWER LOBE ATELECTASIS  POST-OPERATIVE DIAGNOSIS:  RIGHT LOWER LOBE ATELECTASIS  PROCEDURE:  VIDEO BRONCHOSCOPY with BRUSHINGS, BIOPSIES and WASHINGS  SURGEON:  Surgeon(s) and Role:    * Melrose Nakayama, MD - Primary   ANESTHESIA:   general  EBL:  Total I/O In: 750 [I.V.:750] Out: -   BLOOD ADMINISTERED:none  DRAINS: none   LOCAL MEDICATIONS USED:  NONE  SPECIMEN:  Source of Specimen:  RLL washings, biopsies and brushings  DISPOSITION OF SPECIMEN:  Path and Micro  PLAN OF CARE: Discharge to home after PACU  PATIENT DISPOSITION:  PACU - hemodynamically stable.   Delay start of Pharmacological VTE agent (>24hrs) due to surgical blood loss or risk of bleeding: not applicable  FINDINGS: Mass lesion at origin of superior segmental bronchus RLL, thick, purulent secretions from RLL

## 2013-10-10 NOTE — Progress Notes (Signed)
Nurse assisted patient to bathroom and back to stretcher. CBG obtained and radial pulse was taken for one full minute and it was 52. Will discontinue ordered Metoprolol. Currently patient is resting in stretcher. No other needs at this time.  Call bell in reach. Will continue to monitor.

## 2013-10-10 NOTE — Anesthesia Postprocedure Evaluation (Signed)
  Anesthesia Post-op Note  Patient: Kyle [name]  Procedure(s) Performed: Procedure(s) with comments: VIDEO BRONCHOSCOPY WITH ENDOBRONCHIAL NAVIGATION (N/A) - NO BLOOD THINNERS BUT PATIENT HAS ITP  Patient Location: PACU  Anesthesia Type:General  Level of Consciousness: awake, alert  and oriented  Airway and Oxygen Therapy: Patient Spontanous Breathing  Post-op Pain: mild  Post-op Assessment: Post-op Vital signs reviewed  Post-op Vital Signs: Reviewed  Complications: No apparent anesthesia complications

## 2013-10-10 NOTE — Transfer of Care (Signed)
Immediate Anesthesia Transfer of Care Note  Patient: Kyle [name]  Procedure(s) Performed: Procedure(s) with comments: VIDEO BRONCHOSCOPY WITH ENDOBRONCHIAL NAVIGATION (N/A) - NO BLOOD THINNERS BUT PATIENT HAS ITP  Patient Location: PACU  Anesthesia Type:General  Level of Consciousness: awake, alert , oriented and patient cooperative  Airway & Oxygen Therapy: Patient Spontanous Breathing and Patient connected to nasal cannula oxygen  Post-op Assessment: Report given to PACU RN, Post -op Vital signs reviewed and stable and Patient moving all extremities X 4  Post vital signs: Reviewed and stable  Complications: No apparent anesthesia complications

## 2013-10-10 NOTE — Discharge Instructions (Addendum)
Do not drive or engage in heavy physical activity for 24 hours  You may cough up small amounts of blood over the next few days  My office will contact you with follow up instructions  You may use over the counter cough medication as needed. Ask your pharmacist about any possible drug interactions  Call [phone] if you have a fever > 101, cough up large amounts of blood or develop chest pain or shortness of breath

## 2013-10-10 NOTE — OR Nursing (Signed)
      Images taken from bronchoscopy/ENB performed on 10/10/2013.

## 2013-10-10 NOTE — Anesthesia Preprocedure Evaluation (Addendum)
Anesthesia Evaluation  Patient identified by MRN, date of birth, ID band Patient awake    Reviewed: Allergy & Precautions, H&P , NPO status , Patient's Chart, lab work & pertinent test results  Airway Mallampati: II TM Distance: >3 FB Neck ROM: Full    Dental  (+) Teeth Intact, Dental Advisory Given   Pulmonary sleep apnea and Continuous Positive Airway Pressure Ventilation , COPD COPD inhaler and oxygen dependent, former smoker,          Cardiovascular hypertension, Pt. on medications and Pt. on home beta blockers + CAD, + Past MI and + Peripheral Vascular Disease     Neuro/Psych    GI/Hepatic   Endo/Other  diabetes, Type 2Morbid obesity  Renal/GU CRFRenal disease     Musculoskeletal   Abdominal   Peds  Hematology  (+) Blood dyscrasia, ,   Anesthesia Other Findings   Reproductive/Obstetrics                          Anesthesia Physical Anesthesia Plan  ASA: IV  Anesthesia Plan: General   Post-op Pain Management:    Induction: Intravenous  Airway Management Planned: Oral ETT  Additional Equipment:   Intra-op Plan:   Post-operative Plan: Extubation in OR  Informed Consent: I have reviewed the patients History and Physical, chart, labs and discussed the procedure including the risks, benefits and alternatives for the proposed anesthesia with the patient or authorized representative who has indicated his/her understanding and acceptance.   Dental advisory given  Plan Discussed with: CRNA, Anesthesiologist and Surgeon  Anesthesia Plan Comments:        Anesthesia Quick Evaluation

## 2013-10-10 NOTE — Interval H&P Note (Signed)
History and Physical Interval Note:  10/10/2013 4:37 PM  Kyle [name]  has presented today for surgery, with the diagnosis of RIGHT LOWER LOBE ATELECTASIS  The various methods of treatment have been discussed with the patient and family. After consideration of risks, benefits and other options for treatment, the patient has consented to  Procedure(s) with comments: Kyle [name] (N/A) - NO BLOOD THINNERS BUT PATIENT HAS ITP as a surgical intervention .  The patient's history has been reviewed, patient examined, no change in status, stable for surgery.  I have reviewed the patient's chart and labs.  Questions were answered to the patient's satisfaction.     Donna Snooks C

## 2013-10-10 NOTE — Preoperative (Signed)
Beta Blockers   Reason not to administer Beta Blockers:Not Applicable, Pt took Metoprolol at 2030 on 10-09-13

## 2013-10-10 NOTE — Anesthesia Procedure Notes (Signed)
Procedure Name: Intubation Date/Time: 10/10/2013 7:21 PM Performed by: Carney Living Pre-anesthesia Checklist: Patient identified, Emergency Drugs available, Suction available, Patient being monitored and Timeout performed Patient Re-evaluated:Patient Re-evaluated prior to inductionOxygen Delivery Method: Circle system utilized Preoxygenation: Pre-oxygenation with 100% oxygen Intubation Type: IV induction Laryngoscope Size: Mac and 4 Grade View: Grade II Tube type: Oral Tube size: 8.5 mm Number of attempts: 1 Airway Equipment and Method: Stylet and LTA kit utilized Placement Confirmation: ETT inserted through vocal cords under direct vision,  positive ETCO2 and breath sounds checked- equal and bilateral Secured at: 23 cm Tube secured with: Tape Dental Injury: Teeth and Oropharynx as per pre-operative assessment

## 2013-10-10 NOTE — Brief Op Note (Signed)
10/10/2013  6:56 PM  PATIENT:  Kyle [name]  80 y.o. male  PRE-OPERATIVE DIAGNOSIS:  RIGHT LOWER LOBE ATELECTASIS  POST-OPERATIVE DIAGNOSIS:  RIGHT LOWER LOBE ATELECTASIS  PROCEDURE:  VIDEO BRONCHOSCOPY AND NAVIGATIONAL BRONCHOSCOPY with BIOPSIES and BRUSHINGS  SURGEON:  Surgeon(s) and Role:    * Melrose Nakayama, MD - Primary   ANESTHESIA:   general  EBL:  Total I/O In: 750 [I.V.:750] Out: -   BLOOD ADMINISTERED:none  DRAINS: none   LOCAL MEDICATIONS USED:  NONE  SPECIMEN:  Source of Specimen:  right lower lobe  DISPOSITION OF SPECIMEN:  Pathology and micro   PLAN OF CARE: Discharge to home after PACU  PATIENT DISPOSITION:  PACU - hemodynamically stable.   Delay start of Pharmacological VTE agent (>24hrs) due to surgical blood loss or risk of bleeding: not applicable  FINDINGS: thick purulent secretions. Mass lesion at origin of superior segmental bronchus

## 2013-10-10 NOTE — H&P (View-Only) (Signed)
PCP is [name], MD Referring Provider is Kefalas, Manon Hilding, PA-C  Chief Complaint  Patient presents with  . Lung Cancer    Surgical eval, PET Scan 10/03/13, Chest CT 09/16/13    HPI: Kyle [name] is a 80 year old gentleman with a history of lung cancer who is sent for consultation regarding persistent right lower lobe atelectasis.  Kyle [name] is a 80 year old gentleman with a remote history of tobacco abuse (quit in 2012, 50 pack years). His medical history is significant for COPD, obstructive sleep apnea, coronary artery disease, idiopathic thrombocytopenic purpura, abdominal aortic aneurysm repair, ventral hernia, and type 2 diabetes. In addition he has a history of stage IA non-small cell carcinoma (T1b, N0 adenocarcinoma) resected with a right lower lobe superior segmentectomy in 2012 by Dr. Arlyce Dice.   He has been followed by oncology. In August of 2014 there was noted to be some atelectasis in the right lower lobe. A PET CT at that time did not show a clear mass though somewhat compromised by a recent biopsy. He said he had a followup CT in November which showed the area of atelectasis was slightly improved. A more recent CT showed a more progressive atelectasis. A PET CT showed some generalized hypermetabolic activity in the area but no definite evidence of recurrence.  Kyle [name] says that he "gives out" with exertion. He cannot really be more specific about that sensation. He times describes it as generalized tiredness another time says he gets short of breath. He is not having any chest pain, but of course is diabetic. He says that he can walk less than 100 yards before he gets tired. He has not lost any weight. He has a cough productive of clear mucus, no hemoptysis. He does have obstructive sleep apnea and uses CPAP and 2.5 L of oxygen at night.  He has a 50-pack-year history of smoking but quit in 2013.   Past Medical History  Diagnosis Date  . Arteriosclerotic cardiovascular disease  (ASCVD) 1973, 12/2010    S/P NSTEMI secondary to distal RCA/PL lesion, tx medically.  EF of  55%-60% per  echo.  . Diabetes mellitus     Type II  . Thrombocytopenia   . COPD (chronic obstructive pulmonary disease)   . AAA (abdominal aortic aneurysm) 2004    s/p repair 2004; 4.3 cm infrarenal in 05/2011  . Tobacco abuse     50-pack-year consumption; quit in 12/2010  . Hyperlipidemia   . OSA (obstructive sleep apnea)   . Hypertension   . Benign prostatic hypertrophy     s/p transurethral resection of the prostate  . Obesity   . Tubular adenoma of colon   . Cataract   . Ventral hernia   . Diverticulosis   . Bilateral renal masses     Cystic, more prominent on CT in 12/2010 than 2007; followed by Dr. Rosana Hoes  . ITP (idiopathic thrombocytopenic purpura) 09/07/2012    Chronic ITP of adults versus medication-induced ITP.  Stable  . Insomnia   . Bladder cancer 1996    Transurethral resection of the bladder + chemotherapy/BCG as premed  . Adenocarcinoma, lung 01/2011    transthoracic FNA; resection of the superior segment of the RLL in 03/2011; negative nodes; no chemotherapy nor radiation planned  . Chronic kidney disease     Creatinine 1.4 on discharge 12/20/2100; proteinuria; normal renal ultrasound in 2010; recent creatinines of 1.7-2.; Bilateral cystic renal masses by CT in 2011  . Nephrolithiasis 2012    ARF in  01/2011 due to obstructing nephrolithiasis  . Arm fracture     right arm  . Essential and other specified forms of tremor 02/23/2013  . Diplopia 02/23/2013  . Polyneuropathy in diabetes(357.2) 02/23/2013  . Abnormality of gait 02/23/2013  . Hx of Clostridium difficile infection   . Adenocarcinoma of right lung 02/24/2011    Ct A/P 2012:  2cm lung mass RLL PET 1017:  Hypermetabolic RLL mass, no other hypermetabolic areas. TTNA 02/2011:  Adenocarcinoma, markers c/w lung origin Right lower lobe superior segmentectomy. 04/01/2011 Dr. Arlyce Dice     Past Surgical History  Procedure Laterality Date   . Transurethral resection of prostate    . Cystectomy    . Tonsillectomy    . Colonoscopy  03/19/2010    Dr. Gala Romney -(poor prep) Anal papilla, rectal hyperplastic polyp, tubular adenoma removed splenic flexure, left-sided diverticula  . Wedge resection  04/2011    carcinoma of lung  . Colonoscopy  11/28/2004    RMR:  Diminutive rectal and left colon polyps as described above, cold  biopsied/removed/  Left sided diverticula. The remainder of the colonic mucosa appeared normal.  . Colonoscopy   09/15/01    RMR: Multiple diminutive polyps destroyed with dermolysis as described above/ Multiple small polyps on stalks in the colon resected with snare cautery/ Scattered pan colonic diverticulum/ The remainder of the colonic mucosa appeared normal  . Abdominal aortic aneurysm repair  2004  . Cardiac catheterization    . Lung lobectomy      Family History  Problem Relation Age of Onset  . Aortic aneurysm Mother   . Emphysema Father     smoker  . Clotting disorder Father   . Arthritis Father   . Hypertension Father   . Diabetes Father   . Aortic aneurysm Father   . Tremor Father   . Stroke Paternal Grandmother   . Other Paternal Grandfather     brain aneurysm    Social History History  Substance Use Topics  . Smoking status: Former Smoker -- 1.00 packs/day for 50 years    Types: Cigarettes    Quit date: 12/19/2012  . Smokeless tobacco: Never Used  . Alcohol Use: No    Current Outpatient Prescriptions  Medication Sig Dispense Refill  . ACCU-CHEK AVIVA PLUS test strip       . ACCU-CHEK SOFTCLIX LANCETS lancets       . acetaminophen (TYLENOL) 500 MG tablet Take 500 mg by mouth every 6 (six) hours as needed.        Marland Kitchen albuterol (PROAIR HFA) 108 (90 BASE) MCG/ACT inhaler Inhale 2 puffs into the lungs every 6 (six) hours as needed for wheezing.  1 Inhaler  1  . ALPRAZolam (XANAX) 0.5 MG tablet Take 0.5 mg by mouth 2 (two) times daily as needed.  60 tablet  5  . aspirin EC 81 MG tablet  81 mg. Take 81 mg by mouth daily.      . Blood Glucose Monitoring Suppl (ACCU-CHEK AVIVA PLUS) W/DEVICE KIT       . cetirizine (ZYRTEC) 10 MG tablet Take 10 mg by mouth daily.       . citalopram (CELEXA) 20 MG tablet TAKE ONE TABLET BY MOUTH ONCE DAILY  90 tablet  1  . ferrous sulfate (FERROUSUL) 325 (65 FE) MG tablet Take 325 mg by mouth daily with breakfast.        . fluticasone (FLONASE) 50 MCG/ACT nasal spray Place 2 sprays into both nostrils daily as needed.      Marland Kitchen  glipiZIDE (GLUCOTROL XL) 2.5 MG 24 hr tablet Take 1 tablet (2.5 mg total) by mouth daily with breakfast.  30 tablet  5  . lisinopril (PRINIVIL,ZESTRIL) 2.5 MG tablet Take 2.5 mg by mouth daily.      . metoprolol (LOPRESSOR) 50 MG tablet Take 1 tablet (50 mg total) by mouth 2 (two) times daily.  180 tablet  1  . niacin (NIASPAN) 500 MG CR tablet TAKE TWO TABLETS BY MOUTH ONCE DAILY AT BEDTIME  180 tablet  1  . nitroGLYCERIN (NITROSTAT) 0.4 MG SL tablet Place 1 tablet (0.4 mg total) under the tongue as needed. Place 0.4 mg under the tongue every 5 (five) minutes as needed. Call MD if need more than 2  3 tablet  5  . pravastatin (PRAVACHOL) 80 MG tablet TAKE ONE TABLET BY MOUTH ONCE DAILY  90 tablet  1  . tiotropium (SPIRIVA) 18 MCG inhalation capsule Place 1 capsule (18 mcg total) into inhaler and inhale daily.  90 capsule  4  . Vitamin D, Ergocalciferol, (DRISDOL) 50000 UNITS CAPS capsule TAKE ONE CAPSULE BY MOUTH MONTHLY  3 capsule  1  . [DISCONTINUED] budesonide-formoterol (SYMBICORT) 160-4.5 MCG/ACT inhaler Inhale 2 puffs into the lungs 2 (two) times daily.         No current facility-administered medications for this visit.    Allergies  Allergen Reactions  . Codeine Anaphylaxis  . Etodolac     dizziness    Review of Systems  Constitutional: Positive for activity change and fatigue. Negative for fever and chills.  HENT: Positive for hearing loss.   Respiratory: Positive for apnea (uses CPAP), cough (Productive, clear  mucus) and shortness of breath ("gives out"with exertion).        Home oxygen 2.5 L per minute at night  Cardiovascular: Positive for leg swelling. Negative for chest pain.       History of 2 heart attacks  Genitourinary: Positive for dysuria and frequency.  Neurological:       Memory loss  Hematological: Bruises/bleeds easily (history of ITP).  Psychiatric/Behavioral: The patient is nervous/anxious.   All other systems reviewed and are negative.    BP 124/74  Pulse 72  Resp 20  Ht 6' (1.829 m)  Wt 270 lb (122.471 kg)  BMI 36.61 kg/m2  SpO2 94% Physical Exam  Vitals reviewed. Constitutional: He is oriented to person, place, and time. No distress.  Obese  HENT:  Head: Normocephalic and atraumatic.  Eyes: Pupils are equal, round, and reactive to light.  Neck: Neck supple. No thyromegaly present.  Cardiovascular: Normal rate and regular rhythm.   No murmur heard. Pulmonary/Chest:  Diminished breath sounds bilaterally, faint crackles at bases  Abdominal: Soft.  Large midline ventral hernia, smaller hernia superior and to the right  Musculoskeletal: He exhibits edema.  Lymphadenopathy:    He has no cervical adenopathy.  Neurological: He is alert and oriented to person, place, and time.  Hard of hearing, no focal motor deficit  Skin: Skin is warm and dry.     Diagnostic Tests: CT CHEST WITHOUT CONTRAST 07/18/2014 TECHNIQUE:  Multidetector CT imaging of the chest was performed following the  standard protocol without IV contrast.  COMPARISON: CT CHEST W/O CM dated 06/15/2013; NM PET IMAGE RESTAG  (PS) SKULL BASE TO THIGH dated 03/16/2013; CT CHEST W/O CM dated  12/01/2011; NM PET IMAGE INITIAL (PI) SKULL BASE TO THIGH dated  02/17/2011  FINDINGS:  Subcarinal node 1.1 cm, image 28 of series 2, formerly 1.2 cm.  Coronary artery atherosclerosis noted. Increased volume loss along  the wedge resection/lobectomy site at the right posterior lung base.  Airway plugging is present  in this vicinity.  Stable 2 mm subpleural nodule, image 29 of series 3, no change from  02/17/2011, considered benign and inconsequential.  Mild chronic lingular scarring on images 32-33 of series 3.  Ventral upper abdominal hernia contains transverse colon and  omentum.  Stable 3 cm right adrenal adenoma, no change from 2007. Fusiform  aneurysm approximately at the level of the renal arteries, 4.6 cm  transverse by 4.0 cm anterior-posterior.  Dependent gallstones in the gallbladder.  Thoracic spondylosis with multilevel bridging anterior spurring.  IMPRESSION:  1. Progressive volume loss medially at the right lung base, likely  attributable to the airway plugging in this vicinity. Differential  diagnostic considerations include mucus plugging, radiation therapy  related findings, or progressive bronchogenic tumor causing  increasing atelectasis. Bronchoscopy or PET-CT could be utilized for  further characterization.  2. Atherosclerosis.  3. Ventral upper abdominal hernia containing transverse colon and  omentum.  4. Other incidental findings include chronic right adrenal adenoma,  thoracic spondylosis, fusiform abdominal aortic aneurysm, and  gallstones within the gallbladder.  Electronically Signed  By: Sherryl Barters M.D.  On: 11/13/202015 10:47   PET/CT 10/03/2013 NUCLEAR MEDICINE PET SKULL BASE TO THIGH  TECHNIQUE:  14.0 mCi F-18 FDG was injected intravenously. Full-ring PET imaging  was performed from the skull base to thigh after the radiotracer. CT  data was obtained and used for attenuation correction and anatomic  localization.  FASTING BLOOD GLUCOSE: Value: 113 mg/dl  COMPARISON: 03/16/2013  FINDINGS:  NECK  No hypermetabolic lymph nodes in the neck.  CHEST  The area of postoperative change is stable when compared to the  recent CT scan. It had improved in appearance since the prior CT  from 06/15/2013. Low level FDG uptake is noted with SUV max of 3.1.  This is  not unexpected given the amount of atelectasis. I do not see  any findings worrisome or suspicious for residual or recurrent  tumor. No metastatic pulmonary nodules are identified. No  mediastinal or hilar mass or adenopathy.  ABDOMEN/PELVIS  No abnormal hypermetabolic activity within the liver, pancreas,  adrenal glands, or spleen. No hypermetabolic lymph nodes in the  abdomen or pelvis. Stable anterior abdominal wall hernia containing  the transverse colon. Stable large renal cysts and gallstones.  Stable right adrenal gland adenoma.  Mild diffuse bladder wall thickening is noted along with a small  amount of air in the bladder. This may be from recent  instrumentation.  SKELETON  No focal hypermetabolic activity to suggest skeletal metastasis.  IMPRESSION:  1. Low level residual FDG uptake in the area of previous surgery  with moderate atelectasis. I do not see any findings suspicious for  recurrent or residual tumor. Recommend continued CT surveillance.  2. No findings for metastatic disease.  Electronically Signed  By: Kalman Jewels M.D.  On: 10/03/2013 11:47  Impression: 80 year old gentleman with a history of a stage IA non-small cell carcinoma resected with a right lower lobe superior segmentectomy by Dr. Arlyce Dice 2012. He now has persistent volume loss in the right lower lobe on CT. This has waxed and waned somewhat over time. PET/CT shows some generalized hypermetabolic activity within SUV max of 3.1. There is no definite evidence of recurrent tumor, but scans cannot completely rule out possibility.  In any event given that this atelectasis has persisted, I think bronchoscopy is warranted. We  will plan to evaluate the airways and see if there is a mechanical issue that is preventing the atelectatic lung from being aerated. If we see any suspicious lesions we will plan to biopsy them.  I do think given the potential complexity of the bronchoscopy and his history of ITP that this  would best be done in the operating room under general anesthesia. We'll plan to use a electromagnetic navigational mapping to assist with the bronchoscopy.  I discussed with the patient and his wife the indications, risks, benefits, and alternatives. They understand that the risks include those related general anesthesia as well as procedure specific risks of bleeding, pneumothorax, nondiagnostic biopsies. He understands and accepts the risks and wishes to proceed.  Plan: Electromagnetic navigational bronchoscopy on Monday, March 23. We will plan to do this as an outpatient procedure.

## 2013-10-11 ENCOUNTER — Encounter (HOSPITAL_COMMUNITY): Payer: Self-pay | Admitting: Thoracic Surgery (Cardiothoracic Vascular Surgery)

## 2013-10-11 NOTE — Op Note (Signed)
NAMEMOUNIR, SKIPPER                ACCOUNT NO.:  [id number]  MEDICAL RECORD NO.:  [id number]  LOCATION:  [facility]                         FACILITY:  [facility]  PHYSICIAN:  Revonda Standard. Roxan Hockey, M.D.DATE OF BIRTH:  [birth year]  DATE OF PROCEDURE:  10/10/2013 DATE OF DISCHARGE:  10/10/2013                              OPERATIVE REPORT   PREOPERATIVE DIAGNOSIS:  Right lower lobe atelectasis.  POSTOPERATIVE DIAGNOSIS:  Right lower lobe atelectasis, likely recurrence.  PROCEDURE:  Video bronchoscopy with endobronchial navigation with brushings, biopsies, and washings.  SURGEON:  Revonda Standard. Roxan Hockey, MD  ASSISTANT:  None.  ANESTHESIA:  General.  FINDINGS:  Thick purulent secretions in the right lower lobe, mass lesion what was appeared to be the take off the superior segmental bronchus.  Biopsies and brushings performed, pathology pending.  CLINICAL NOTE:  Mr. Titzer is a 80 year old gentleman with a history of lung cancer.  He previously had a right lower lobe superior segmentectomy.  He now presents with persistent right lower lobe atelectasis.  A PET scan showed generalized hypermetabolism in the area, but the radiologist was not concerned about recurrence.  The patient was advised to undergo bronchoscopy for further evaluation.  The indications, risks, benefits, and alternatives were discussed in detail with the patient. He understood and accepted the risks and agreed to proceed.  OPERATIVE NOTE:  Mr. Cain was brought to the operating room on October 10, 2013. He had an induction of general anesthesia and was intubated.  Flexible fiberoptic bronchoscopy was performed via the endotracheal tube.  There were thick purulent secretions.  These were sent for both culture and cytology.  The trachea and left bronchial tree were within normal limits with no endobronchial lesions.  The scope was then passed into the right mainstem bronchus which was normal as was the right upper lobe bronchus  to the level of subsegmental bronchi.  The bronchus intermedius was normal.  As the bifurcation of the middle and lower lobe was approached, there was a mass seen laterally where the origin of the superior segmental bronchus should be.  The patient had a previous superior segmentectomy. This did not have the normal appearance of a staple line.  There was a question whether there was a small stump of the segmental bronchus left at the time of the original resection.  Inspection of the middle lobe bronchus was unremarkable.  There were thick purulent secretions in the lower lobe bronchi, these were copiously irrigated and suctioned.  Some biopsies were obtained from that area, although the bronchial mucosa appeared relatively normal.  Next, attention was turned to the origin of the superior segmental bronchus.  No definite bronchus could be seen, but there was a mass lesion in that area.  No staple line was evident.  The navigational probe then was advanced and mapping was performed.  There was good correlation.  The mapping attempted to have the probe go out past the endobronchial mass into the superior segmental bronchus, but this was not possible due to his previous surgery.  There were no other suspicious findings.  The navigational probe was removed.  Next, biopsies and brushings were performed of the mass.  This was friable and  bled easily.  Dilute epinephrine was applied to assist with hemostasis.  No attempt was made to completely excise the mass or to pass the mass with a probe or bronchoscope.  After obtaining what was felt to be sufficient biopsy material, the specimens were sent for permanent pathology. A final inspection was made for hemostasis.  The scope was withdrawn.  The patient was taken from the operating room to the postanesthetic care unit in good condition.     Revonda Standard Roxan Hockey, M.D.     SCH/MEDQ  D:  10/10/2013  T:  10/11/2013  Job:  [id number]

## 2013-10-12 ENCOUNTER — Encounter: Payer: Self-pay | Admitting: Thoracic Surgery (Cardiothoracic Vascular Surgery)

## 2013-10-12 ENCOUNTER — Ambulatory Visit (INDEPENDENT_AMBULATORY_CARE_PROVIDER_SITE_OTHER): Payer: Medicare Other | Admitting: Thoracic Surgery (Cardiothoracic Vascular Surgery)

## 2013-10-12 VITALS — BP 128/75 | HR 60 | Resp 20 | Ht 72.0 in | Wt 270.0 lb

## 2013-10-12 DIAGNOSIS — Z9889 Other specified postprocedural states: Secondary | ICD-10-CM

## 2013-10-12 NOTE — Progress Notes (Signed)
Patient ID: Kyle [name], male   DOB: [birth year], 80 y.o.   MRN: [id number]  Kyle [name] and his family return today to discuss the results of his biopsies.  He is a 80 year old gentleman who had undergone a right lower lobe superior segmentectomy by Dr. Arlyce Dice for a stage IB non-small cell carcinoma in the past. He had progressive lower lobe atelectasis on his CT scans. A PET showed some mild hypermetabolism but was felt by radiology to be consistent with atelectasis. We went ahead and did a bronchoscopy with biopsies just to be sure.  At bronchoscopy there was a "mass" at the expected takeoff of the superior segmental bronchus. There were thick secretions. There was no obstruction of the basilar segmental bronchi of the lower lobe. Biopsies and brushings were obtained. Secretions were also sent for cultures.   Pathology PATHOLOGYINAL DIAGNOSIS Diagnosis 1. Lung, biopsy, RLL - BENIGN LUNG TISSUE, SEE COMMENT. - NEGATIVE FOR ATYPIA OR MALIGNANCY. 2. Lung, biopsy, RLL - BENIGN LUNG TISSUE, SEE COMMENT. - NEGATIVE FOR ATYPIA OR MALIGNANCY. Microscopic Comment 1. Multiple biopsies demonstrate non neoplastic lung with reactive bronchial respiratory-type epithelium overlying thickened basement membrane. Within the subepithelial tissue, there is prominent lymphoplasmacytic inflammation and edema. There are no features of epithelial dysplasia or malignancy present. The case was reviewed with Dr. Gari Crown who concurs. 2. Multiple biopsies demonstrate non neoplastic lung with reactive bronchial respiratory-type epithelium overlying thickened basement. Within the subepithelial tissue, there is chronic lymphoplasmacytic inflammation, fibroelastotic stromal change, and edema. There is incidental oxyntic metaplasia of benign salivary-type glands. There are no features of epithelial dysplasia or malignancy present. (CRR:gt, 10/12/13) Kyle [name] Pathologist, Electronic Signature (Case signed  10/12/2013)  Micro  No growth at 2 days  Impression and plan I had a long discussion with Kyle [name] and his family regarding these findings. I was very worried about the possibility of recurrence at the superior segmental bronchial stump. We did extensive biopsies in that area and all showed only benign tissue. There were signs of inflammation, including signs that you would see in the setting of a right middle lobe syndrome. He obviously does not have right middle lobe syndrome, since the process involves the right lower lobe. However it could be analogous.  Cultures are negative so far but he is arty been started on Augmentin for an enterococcal UTI, which could keep the sputum culture from growing anything.  Given the pathologic findings and the radiologist view of the PET CT, it may just be chronic infection and inflammation, rather than recurrent cancer. I don't think he would be a candidate for a more extensive resection.  After discussion with the family my recommendation was that we would repeat his CT scan in 8 weeks and I will see him back at that time.

## 2013-10-13 LAB — CULTURE, RESPIRATORY W GRAM STAIN

## 2013-10-13 LAB — CULTURE, RESPIRATORY: CULTURE: NO GROWTH

## 2013-10-16 ENCOUNTER — Other Ambulatory Visit: Payer: Self-pay | Admitting: Family Medicine

## 2013-10-19 ENCOUNTER — Encounter (HOSPITAL_COMMUNITY): Payer: Self-pay | Admitting: Oncology

## 2013-10-19 ENCOUNTER — Encounter (HOSPITAL_COMMUNITY): Payer: Medicare Other | Attending: Oncology | Admitting: Oncology

## 2013-10-19 VITALS — BP 100/62 | HR 70 | Temp 97.6°F | Resp 18 | Wt 267.4 lb

## 2013-10-19 DIAGNOSIS — J449 Chronic obstructive pulmonary disease, unspecified: Secondary | ICD-10-CM

## 2013-10-19 DIAGNOSIS — C3490 Malignant neoplasm of unspecified part of unspecified bronchus or lung: Secondary | ICD-10-CM | POA: Insufficient documentation

## 2013-10-19 DIAGNOSIS — R222 Localized swelling, mass and lump, trunk: Secondary | ICD-10-CM | POA: Insufficient documentation

## 2013-10-19 DIAGNOSIS — C3491 Malignant neoplasm of unspecified part of right bronchus or lung: Secondary | ICD-10-CM

## 2013-10-19 DIAGNOSIS — Z8551 Personal history of malignant neoplasm of bladder: Secondary | ICD-10-CM

## 2013-10-19 DIAGNOSIS — R918 Other nonspecific abnormal finding of lung field: Secondary | ICD-10-CM

## 2013-10-19 DIAGNOSIS — D693 Immune thrombocytopenic purpura: Secondary | ICD-10-CM | POA: Insufficient documentation

## 2013-10-19 DIAGNOSIS — D696 Thrombocytopenia, unspecified: Secondary | ICD-10-CM

## 2013-10-19 DIAGNOSIS — R042 Hemoptysis: Secondary | ICD-10-CM | POA: Insufficient documentation

## 2013-10-19 DIAGNOSIS — Z85118 Personal history of other malignant neoplasm of bronchus and lung: Secondary | ICD-10-CM

## 2013-10-19 DIAGNOSIS — J4489 Other specified chronic obstructive pulmonary disease: Secondary | ICD-10-CM | POA: Insufficient documentation

## 2013-10-19 MED ORDER — ALBUTEROL SULFATE HFA 108 (90 BASE) MCG/ACT IN AERS: 2.0000 | INHALATION_SPRAY | Freq: Four times a day (QID) | RESPIRATORY_TRACT | Status: DC | PRN

## 2013-10-19 NOTE — Addendum Note (Signed)
Addended by: Baird Cancer on: 10/19/2013 06:13 PM   Modules accepted: Level of Service

## 2013-10-19 NOTE — Patient Instructions (Signed)
[facility] Discharge Instructions  RECOMMENDATIONS MADE BY THE CONSULTANT AND ANY TEST RESULTS WILL BE SENT TO YOUR REFERRING PHYSICIAN.   MEDICATIONS PRESCRIBED:  Albuterol inhaler with 1 refill  INSTRUCTIONS GIVEN AND DISCUSSED: Pulmonary toilet 1-3 times per day. Pulmonary toilet instructions:  1. Take your albuterol inhaler.  1 puff, wait 2 minutes, then 1 more puff  2. Wait 5 minutes  3. On your bare back (no clothes on upper body), have your wife or another person cup their hand and past your on your back.  This should be done pretty aggressively, but not to the point of pain.  4. Simultaneously, you should cough and clear sputum that is coughed up. When resting, or sleeping, try to lay on your left side.   SPECIAL INSTRUCTIONS/FOLLOW-UP: Rx for albuterol inhaler CT scan of chest in 2 months Return in 2 months after your CT scan to review results  Thank you for choosing [facility] to provide your oncology and hematology care.  To afford each patient quality time with our providers, please arrive at least 15 minutes before your scheduled appointment time.  With your help, our goal is to use those 15 minutes to complete the necessary work-up to ensure our physicians have the information they need to help with your evaluation and healthcare recommendations.    Effective January 1st, 2014, we ask that you re-schedule your appointment with our physicians should you arrive 10 or more minutes late for your appointment.  We strive to give you quality time with our providers, and arriving late affects you and other patients whose appointments are after yours.    Again, thank you for choosing [facility].  Our hope is that these requests will decrease the amount of time that you wait before being seen by our physicians.       _____________________________________________________________  Should you have questions after your visit to [facility], please contact our office at [phone] between the hours of 8:30 a.m. and 5:00 p.m.  Voicemails left after 4:30 p.m. will not be returned until the following business day.  For prescription refill requests, have your pharmacy contact our office with your prescription refill request.

## 2013-10-19 NOTE — Progress Notes (Signed)
[name], MD [place] [address]  Adenocarcinoma of right lung - Plan: CT Chest Wo Contrast  COPD (chronic obstructive pulmonary disease) - Plan: albuterol (PROAIR HFA) 108 (90 BASE) MCG/ACT inhaler  Lung mass - Plan: albuterol (PROAIR HFA) 108 (90 BASE) MCG/ACT inhaler, CT Chest Wo Contrast  Hemoptysis, unspecified - Plan: albuterol (PROAIR HFA) 108 (90 BASE) MCG/ACT inhaler  ITP (idiopathic thrombocytopenic purpura) - Plan: CBC with Differential  CURRENT THERAPY: Surveillance per NCCN guidelines.  INTERVAL HISTORY: Kyle [name] 80 y.o. male returns for  regular  visit for followup of Stage I a (T1 B., N0, M0) moderately differentiated adenocarcinoma the lung, 2.3 cm in size with surgery on 04/03/2011. Margins were clear, no LV I was seen, and there is no pleural involvement. 6 lymph nodes were all negative getting stage IA disease.  AND Thrombocytopenia thus far consistent with chronic ITP of adults versus drug-induced mild thrombocytopenia. He is on too many medications to withdraw them completely. There is no evidence on his Feb 2014 CT scan of splenomegaly or cirrhosis.    Adenocarcinoma of right lung   04/03/2011 Initial Diagnosis INVASIVE MODERATELY DIFFERENTIATED ADENOCARCINOMA, 2.3 CM. (T1b, N0).  Clear margins, no LVI, no pleural involvement. 0/6 nodes.   04/03/2011 Surgery Right lower lobe superior segmentectomy by Dr. Arlyce Dice   04/04/2011 Remission    03/07/2013 Imaging CT of chest-  developing adenopathy in the subcarinal and azygo-esophageal recess stations, mild.  Findings are suspicious for recurrent or localized metastatic disease.    03/16/2013 Imaging PET scan- No definite metabolically active pulmonary lesion and no mediastinal or hilar lymphadenopathy.     06/15/2013 Imaging CT scan of chest- Ill-defined pulmonary density in RLL in the region of patient's prior surgery, less prominent than on prior CT obtained for PET-CT of 02/25/2013.  This is most likely region ofscarring. Underlying tumor cannot be excluded   09/19/2013 Imaging CT of chest- Progressive volume loss medially at the right lung base. Differential diagnostic considerations include mucus plugging, radiation therapy related findings, or progressive bronchogenic carcinoma   10/03/2013 Imaging PET scan- Low level residual FDG uptake in the area of previous surgery with moderate atelectasis. I do not see any findings suspicious for recurrent or residual tumor.   10/10/2013 Procedure Bronchcospy with biopsies and brushings by Dr. Roxan Hockey   10/10/2013 Pathology RLL biopsies are negative for malignancy    Bladder carcinoma   03/27/2011 Initial Diagnosis Bladder carcinoma    - 10/05/2013 Chemotherapy Completed continuous bladder infusion chemotherapy at [facility]     I personally reviewed and went over laboratory results with the patient.  The results are noted within this dictation.  I personally reviewed and went over radiographic studies with the patient.  The results are noted within this dictation.    I personally reviewed and went over pathology results with the patient.  Through [facility], I have communicated with Dr. Roxan Hockey who performed a bronchoscopy and biopsy of "mass" at the superior segmentectomy.  Pathology on these biopsies are negative and reviewed with the patient.  Dr. Roxan Hockey recommends a repeat CT of chest in 2 months.  I have ordered this test.  I have recommend pulmonary toilet exercises.  I reviewed these with the patient and directions are in his discharge instructions.  I gave him an Rx for Albuterol to help with these exercises.  Additionally, I have asked him to lay on his left side when he is resting and sleeping if possible.  2 weeks ago, he completed continuous bladder infusion chemotherapy at [facility].  I do not have the details of this readily available.    Oncologically, he denies any complaints and ROS questioning is negative.    Past  Medical History  Diagnosis Date  . Arteriosclerotic cardiovascular disease (ASCVD) 1973, 12/2010    S/P NSTEMI secondary to distal RCA/PL lesion, tx medically.  EF of  55%-60% per  echo.  . Diabetes mellitus     Type II  . Thrombocytopenia   . COPD (chronic obstructive pulmonary disease)   . AAA (abdominal aortic aneurysm) 2004    s/p repair 2004; 4.3 cm infrarenal in 05/2011  . Tobacco abuse     50-pack-year consumption; quit in 12/2010  . Hyperlipidemia   . OSA (obstructive sleep apnea)   . Hypertension   . Benign prostatic hypertrophy     s/p transurethral resection of the prostate  . Obesity   . Tubular adenoma of colon   . Cataract   . Ventral hernia   . Diverticulosis   . Bilateral renal masses     Cystic, more prominent on CT in 12/2010 than 2007; followed by Dr. Rosana Hoes  . ITP (idiopathic thrombocytopenic purpura) 09/07/2012    Chronic ITP of adults versus medication-induced ITP.  Stable  . Insomnia   . Bladder cancer 1996    Transurethral resection of the bladder + chemotherapy/BCG as premed  . Adenocarcinoma, lung 01/2011    transthoracic FNA; resection of the superior segment of the RLL in 03/2011; negative nodes; no chemotherapy nor radiation planned  . Chronic kidney disease     Creatinine 1.4 on discharge 12/20/2100; proteinuria; normal renal ultrasound in 2010; recent creatinines of 1.7-2.; Bilateral cystic renal masses by CT in 2011  . Nephrolithiasis 2012    ARF in 01/2011 due to obstructing nephrolithiasis  . Arm fracture     right arm  . Essential and other specified forms of tremor 02/23/2013  . Diplopia 02/23/2013  . Polyneuropathy in diabetes(357.2) 02/23/2013  . Abnormality of gait 02/23/2013  . Hx of Clostridium difficile infection   . Adenocarcinoma of right lung 02/24/2011    Ct A/P 2012:  2cm lung mass RLL PET 8413:  Hypermetabolic RLL mass, no other hypermetabolic areas. TTNA 02/2011:  Adenocarcinoma, markers c/w lung origin Right lower lobe superior segmentectomy.  04/01/2011 Dr. Arlyce Dice   . Coronary artery disease   . Myocardial infarction   . Cough     thick phlegm    has Arteriosclerotic cardiovascular disease (ASCVD); Hypertension; CKD (chronic kidney disease) stage 3, GFR 30-59 ml/min; COPD (chronic obstructive pulmonary disease); Adenocarcinoma of right lung; Obstructive sleep apnea; Fasting hyperglycemia; Nephrolithiasis; Bladder carcinoma; Diarrhea; Tubular adenoma of colon; Ventral hernia; AAA (abdominal aortic aneurysm); Tobacco abuse; LLQ pain; ITP (idiopathic thrombocytopenic purpura); Type II or unspecified type diabetes mellitus with neurological manifestations, not stated as uncontrolled(250.60); Fracture of right distal radius; COPD exacerbation; Essential and other specified forms of tremor; Diplopia; Polyneuropathy in diabetes(357.2); and Abnormality of gait on his problem list.     is allergic to codeine and etodolac.  Mr. Vick had no medications administered during this visit.  Past Surgical History  Procedure Laterality Date  . Transurethral resection of prostate    . Cystectomy    . Tonsillectomy    . Colonoscopy  03/19/2010    Dr. Gala Romney -(poor prep) Anal papilla, rectal hyperplastic polyp, tubular adenoma removed splenic flexure, left-sided diverticula  . Wedge resection  04/2011    carcinoma of  lung  . Colonoscopy  11/28/2004    RMR:  Diminutive rectal and left colon polyps as described above, cold  biopsied/removed/  Left sided diverticula. The remainder of the colonic mucosa appeared normal.  . Colonoscopy   09/15/01    RMR: Multiple diminutive polyps destroyed with dermolysis as described above/ Multiple small polyps on stalks in the colon resected with snare cautery/ Scattered pan colonic diverticulum/ The remainder of the colonic mucosa appeared normal  . Abdominal aortic aneurysm repair  2004  . Cardiac catheterization    . Lung lobectomy    . Video bronchoscopy with endobronchial navigation N/A 10/10/2013    Procedure:  VIDEO BRONCHOSCOPY WITH ENDOBRONCHIAL NAVIGATION;  Surgeon: Melrose Nakayama, MD;  Location: [facility];  Service: Thoracic;  Laterality: N/A;  NO BLOOD THINNERS BUT PATIENT HAS ITP    Denies any headaches, dizziness, double vision, fevers, chills, night sweats, nausea, vomiting, diarrhea, constipation, chest pain, heart palpitations, shortness of breath, blood in stool, black tarry stool, urinary pain, urinary burning, urinary frequency, hematuria.   PHYSICAL EXAMINATION  ECOG PERFORMANCE STATUS: 1 - Symptomatic but completely ambulatory  Filed Vitals:   10/19/13 1500  BP: 100/62  Pulse: 70  Temp: 97.6 F (36.4 C)  Resp: 18    GENERAL:alert, no distress, well nourished, well developed, comfortable, cooperative, obese and smiling SKIN: skin color, texture, turgor are normal, no rashes or significant lesions HEAD: Normocephalic, No masses, lesions, tenderness or abnormalities EYES: normal, PERRLA, EOMI, Conjunctiva are pink and non-injected EARS: External ears normal OROPHARYNX:mucous membranes are moist  NECK: supple, trachea midline LYMPH:  not examined BREAST:not examined LUNGS: clear to auscultation  HEART: regular rate & rhythm, no murmurs and no gallops ABDOMEN:obese BACK: Back symmetric, no curvature. EXTREMITIES:less then 2 second capillary refill, no skin discoloration, no cyanosis  NEURO: alert & oriented x 3 with fluent speech, no focal motor/sensory deficits, gait normal   LABORATORY DATA: CBC    Component Value Date/Time   WBC 6.4 10/06/2013 1531   RBC 4.71 10/06/2013 1531   HGB 14.9 10/06/2013 1531   HCT 44.4 10/06/2013 1531   PLT 104* 10/06/2013 1531   MCV 94.3 10/06/2013 1531   MCH 31.6 10/06/2013 1531   MCHC 33.6 10/06/2013 1531   RDW 16.7* 10/06/2013 1531   LYMPHSABS 2.4 09/12/2013 0905   MONOABS 0.5 09/12/2013 0905   EOSABS 0.3 09/12/2013 0905   BASOSABS 0.0 09/12/2013 0905    PATHOLOGY:  10/10/2013  Diagnosis 1. Lung, biopsy, RLL - BENIGN LUNG TISSUE,  SEE COMMENT. - NEGATIVE FOR ATYPIA OR MALIGNANCY. 2. Lung, biopsy, RLL - BENIGN LUNG TISSUE, SEE COMMENT. - NEGATIVE FOR ATYPIA OR MALIGNANCY. Microscopic Comment 1. Multiple biopsies demonstrate non neoplastic lung with reactive bronchial respiratory-type epithelium overlying thickened basement membrane. Within the subepithelial tissue, there is prominent lymphoplasmacytic inflammation and edema. There are no features of epithelial dysplasia or malignancy present. The case was reviewed with Dr. Gari Crown who concurs. 2. Multiple biopsies demonstrate non neoplastic lung with reactive bronchial respiratory-type epithelium overlying thickened basement. Within the subepithelial tissue, there is chronic lymphoplasmacytic inflammation, fibroelastotic stromal change, and edema. There is incidental oxyntic metaplasia of benign salivary-type glands. There are no features of epithelial dysplasia or malignancy present. (CRR:gt, 10/12/13) Mali RUND DO Pathologist, Electronic Signature (Case signed 10/12/2013)    ASSESSMENT:  1. Stage I a (T1 B., N0, M0) moderately differentiated adenocarcinoma the lung, 2.3 cm in size with surgery on 04/03/2011. Margins were clear, no LV I was seen, and there is no  pleural involvement. 6 lymph nodes were all negative getting stage IA disease.  2. Thrombocytopenia thus far consistent with chronic ITP of adults versus drug-induced mild thrombocytopenia. He is on too many medications to withdraw them completely. There is no evidence on his May 2013 CT scan of splenomegaly or cirrhosis. Stable.  3. Obesity  4. Intermittent diarrhea treated by GI  5. Bladder cancer followed by Dr. Rosana Hoes  6. MI x2 the last in May 2012  7. Abdominal hernia secondary to abdominal aortic aneurysm surgery in 2008  8. Double vision in the past which has not been a recent issue  9. History of skin cancer  10. Chronic renal disease, grade 3, followed by nephrologist  11. Elevated glucose, followed  by Dr. Wolfgang Phoenix. 12. S/P bronchoscopy by Dr. Roxan Hockey on 10/10/2013 with biopsies and they are negative.  Patient Active Problem List   Diagnosis Date Noted  . Essential and other specified forms of tremor 02/23/2013  . Diplopia 02/23/2013  . Polyneuropathy in diabetes(357.2) 02/23/2013  . Abnormality of gait 02/23/2013  . COPD exacerbation 01/31/2013  . Fracture of right distal radius 12/14/2012  . Type II or unspecified type diabetes mellitus with neurological manifestations, not stated as uncontrolled(250.60) 11/30/2012  . ITP (idiopathic thrombocytopenic purpura) 09/07/2012  . LLQ pain 11/13/2011  . Ventral hernia 10/28/2011  . AAA (abdominal aortic aneurysm)   . Tobacco abuse   . Diarrhea 10/22/2011  . Tubular adenoma of colon   . Obstructive sleep apnea 03/27/2011  . Fasting hyperglycemia 03/27/2011  . Nephrolithiasis 03/27/2011  . Bladder carcinoma 03/27/2011  . COPD (chronic obstructive pulmonary disease) 02/24/2011  . Adenocarcinoma of right lung 02/24/2011  . Arteriosclerotic cardiovascular disease (ASCVD) 01/06/2011  . Hypertension 01/06/2011  . CKD (chronic kidney disease) stage 3, GFR 30-59 ml/min 01/06/2011     PLAN:  1. I personally reviewed and went over laboratory results with the patient.  The results are noted within this dictation. 2. I personally reviewed and went over radiographic studies with the patient.  The results are noted within this dictation.   3. I personally reviewed and went over pathology results with the patient. 4. Rx for albuterol 5. Recommend pulmonary toilet 1-3 times daily.  Instructions provided.  Also recommend laying on left side when resting or sleeping if able. 6. CT of chest wo contrast in 2 months 7. Labs in 2 months: CBC diff 8. Oncology history updated 9. Return in 2 months for follow-up after CT scan.   THERAPY PLAN:  We will repeat CT of chest in 2 months for follow-up following recent bronchoscopy.  If all is well, we  will get back to following NCCN guidelines for surveillance of NSCLC.  All questions were answered. The patient knows to call the clinic with any problems, questions or concerns. We can certainly see the patient much sooner if necessary.  Patient and plan discussed with Dr. Farrel Gobble and he is in agreement with the aforementioned.   KEFALAS,THOMAS 10/19/2013

## 2013-10-20 ENCOUNTER — Ambulatory Visit (HOSPITAL_COMMUNITY): Payer: Medicare Other | Admitting: Oncology

## 2013-10-27 ENCOUNTER — Other Ambulatory Visit: Payer: Self-pay | Admitting: *Deleted

## 2013-10-27 DIAGNOSIS — C3490 Malignant neoplasm of unspecified part of unspecified bronchus or lung: Secondary | ICD-10-CM

## 2013-11-03 ENCOUNTER — Encounter: Payer: Self-pay | Admitting: Family Medicine

## 2013-11-03 ENCOUNTER — Ambulatory Visit (INDEPENDENT_AMBULATORY_CARE_PROVIDER_SITE_OTHER): Payer: Medicare Other | Admitting: Family Medicine

## 2013-11-03 VITALS — BP 130/90 | Ht 72.0 in | Wt 270.0 lb

## 2013-11-03 DIAGNOSIS — E7800 Pure hypercholesterolemia, unspecified: Secondary | ICD-10-CM

## 2013-11-03 DIAGNOSIS — I10 Essential (primary) hypertension: Secondary | ICD-10-CM

## 2013-11-03 DIAGNOSIS — J449 Chronic obstructive pulmonary disease, unspecified: Secondary | ICD-10-CM

## 2013-11-03 DIAGNOSIS — E1149 Type 2 diabetes mellitus with other diabetic neurological complication: Secondary | ICD-10-CM

## 2013-11-03 DIAGNOSIS — E119 Type 2 diabetes mellitus without complications: Secondary | ICD-10-CM

## 2013-11-03 DIAGNOSIS — G4733 Obstructive sleep apnea (adult) (pediatric): Secondary | ICD-10-CM

## 2013-11-03 DIAGNOSIS — Z79899 Other long term (current) drug therapy: Secondary | ICD-10-CM

## 2013-11-03 DIAGNOSIS — E1142 Type 2 diabetes mellitus with diabetic polyneuropathy: Secondary | ICD-10-CM

## 2013-11-03 LAB — HEPATIC FUNCTION PANEL
ALBUMIN: 4.3 g/dL (ref 3.5–5.2)
ALT: 13 U/L (ref 0–53)
AST: 15 U/L (ref 0–37)
Alkaline Phosphatase: 64 U/L (ref 39–117)
BILIRUBIN TOTAL: 1.1 mg/dL (ref 0.2–1.2)
Bilirubin, Direct: 0.2 mg/dL (ref 0.0–0.3)
Indirect Bilirubin: 0.9 mg/dL (ref 0.2–1.2)
Total Protein: 6.8 g/dL (ref 6.0–8.3)

## 2013-11-03 LAB — LIPID PANEL
CHOL/HDL RATIO: 4.8 ratio
Cholesterol: 149 mg/dL (ref 0–200)
HDL: 31 mg/dL — AB (ref >39)
LDL CALC: 82 mg/dL (ref 0–99)
Triglycerides: 180 mg/dL — ABNORMAL HIGH (ref <150)
VLDL: 36 mg/dL (ref 0–40)

## 2013-11-03 LAB — POCT GLYCOSYLATED HEMOGLOBIN (HGB A1C): Hemoglobin A1C: 5

## 2013-11-03 NOTE — Progress Notes (Signed)
   Subjective:    Patient ID: Kyle [name], male    DOB: April 22, 1934, 79 y.o.   MRN: [id number]  Diabetes He presents for his follow-up diabetic visit. He has type 2 diabetes mellitus. His disease course has been stable. There are no hypoglycemic associated symptoms. There are no diabetic associated symptoms. There are no hypoglycemic complications. Symptoms are stable. There are no diabetic complications. There are no known risk factors for coronary artery disease. Current diabetic treatment includes oral agent (monotherapy). He is compliant with treatment all of the time.  A1C today is 5.0. Patient states he has no other concerns at this time.   Results for orders placed in visit on 11/03/13  POCT GLYCOSYLATED HEMOGLOBIN (HGB A1C)      Result Value Ref Range   Hemoglobin A1C 5.0     No trouble with sugrs  Had seen the neurologist in the past for double vision and dizziness, ready to stop going Allergies acting up this spring, zyrtec   Watching faats so so. Fried foods so so  Sticking with meds   Still using the CPAP device faithfully. Other than spells and nasal congestion feels is definitely helping him. Also less daytime fatigue.  Saw urologist. Recently diagnosed with a refill of cancer in the bladder. Currently under treatment spring that. No obvious hematuria no dysuria   Review of Systems    no chest pain no back pain no abdominal pain no change about habits no blood in stool ROS otherwise negative Objective:   Physical Exam  Needs bw Alert no acute distress vital stable. HEENT normal. Lungs clear. Heart rare rhythm. Ankles without edema. See diabetic foot exam.     Assessment & Plan:  Impression 1 type 2 diabetes good control. #2 hyperlipidemia status uncertain. #3 sleep apnea with CPAP stable #4 hypertension stable plan appropriate blood work. Diet exercise discussed. Recheck in several months. WSL

## 2013-11-04 LAB — MICROALBUMIN, URINE: Microalb, Ur: 26.26 mg/dL — ABNORMAL HIGH (ref 0.00–1.89)

## 2013-11-08 ENCOUNTER — Encounter: Payer: Self-pay | Admitting: Family Medicine

## 2013-11-09 ENCOUNTER — Telehealth (HOSPITAL_COMMUNITY): Payer: Self-pay

## 2013-11-26 ENCOUNTER — Other Ambulatory Visit: Payer: Self-pay | Admitting: Family Medicine

## 2013-12-13 ENCOUNTER — Ambulatory Visit (INDEPENDENT_AMBULATORY_CARE_PROVIDER_SITE_OTHER): Payer: Medicare Other | Admitting: Thoracic Surgery (Cardiothoracic Vascular Surgery)

## 2013-12-13 ENCOUNTER — Ambulatory Visit
Admission: RE | Admit: 2013-12-13 | Discharge: 2013-12-13 | Disposition: A | Discharge status: Home / Self Care | Payer: Medicare Other | Source: Ambulatory Visit | Attending: Thoracic Surgery (Cardiothoracic Vascular Surgery) | Admitting: Thoracic Surgery (Cardiothoracic Vascular Surgery)

## 2013-12-13 VITALS — BP 126/71 | HR 67 | Resp 18 | Ht 72.0 in | Wt 270.0 lb

## 2013-12-13 DIAGNOSIS — C3490 Malignant neoplasm of unspecified part of unspecified bronchus or lung: Secondary | ICD-10-CM

## 2013-12-13 DIAGNOSIS — Z09 Encounter for follow-up examination after completed treatment for conditions other than malignant neoplasm: Secondary | ICD-10-CM

## 2013-12-13 DIAGNOSIS — C3430 Malignant neoplasm of lower lobe, unspecified bronchus or lung: Secondary | ICD-10-CM

## 2013-12-13 NOTE — Progress Notes (Signed)
HPI:  Returns today for a 2 month followup visit.  He is a 79 year old gentleman who had undergone a right lower lobe superior segmentectomy by Dr. Arlyce Dice for a stage IB non-small cell carcinoma in the past. He had progressive lower lobe atelectasis on his CT scans. A PET showed some mild hypermetabolism but was felt by radiology to be consistent with atelectasis.   We went ahead and did a bronchoscopy with biopsies just to be sure.   At bronchoscopy there was a "mass" at the expected takeoff of the superior segmental bronchus. There were thick secretions. There was no obstruction of the basilar segmental bronchi of the lower lobe. Biopsies and brushings were obtained. Secretions were also sent for cultures. All of the biopsies came back negative for tumor.  He says he has been feeling pretty well since his last visit. He has not had any acute problems with his breathing. His appetite is good and his weight is stable.  Past Medical History  Diagnosis Date  . Arteriosclerotic cardiovascular disease (ASCVD) 1973, 12/2010    S/P NSTEMI secondary to distal RCA/PL lesion, tx medically.  EF of  55%-60% per  echo.  . Diabetes mellitus     Type II  . Thrombocytopenia   . COPD (chronic obstructive pulmonary disease)   . AAA (abdominal aortic aneurysm) 2004    s/p repair 2004; 4.3 cm infrarenal in 05/2011  . Tobacco abuse     50-pack-year consumption; quit in 12/2010  . Hyperlipidemia   . OSA (obstructive sleep apnea)   . Hypertension   . Benign prostatic hypertrophy     s/p transurethral resection of the prostate  . Obesity   . Tubular adenoma of colon   . Cataract   . Ventral hernia   . Diverticulosis   . Bilateral renal masses     Cystic, more prominent on CT in 12/2010 than 2007; followed by Dr. Rosana Hoes  . ITP (idiopathic thrombocytopenic purpura) 09/07/2012    Chronic ITP of adults versus medication-induced ITP.  Stable  . Insomnia   . Bladder cancer 1996    Transurethral resection of  the bladder + chemotherapy/BCG as premed  . Adenocarcinoma, lung 01/2011    transthoracic FNA; resection of the superior segment of the RLL in 03/2011; negative nodes; no chemotherapy nor radiation planned  . Chronic kidney disease     Creatinine 1.4 on discharge 12/20/2100; proteinuria; normal renal ultrasound in 2010; recent creatinines of 1.7-2.; Bilateral cystic renal masses by CT in 2011  . Nephrolithiasis 2012    ARF in 01/2011 due to obstructing nephrolithiasis  . Arm fracture     right arm  . Essential and other specified forms of tremor 02/23/2013  . Diplopia 02/23/2013  . Polyneuropathy in diabetes(357.2) 02/23/2013  . Abnormality of gait 02/23/2013  . Hx of Clostridium difficile infection   . Adenocarcinoma of right lung 02/24/2011    Ct A/P 2012:  2cm lung mass RLL PET 9470:  Hypermetabolic RLL mass, no other hypermetabolic areas. TTNA 02/2011:  Adenocarcinoma, markers c/w lung origin Right lower lobe superior segmentectomy. 04/01/2011 Dr. Arlyce Dice   . Coronary artery disease   . Myocardial infarction   . Cough     thick phlegm       Current Outpatient Prescriptions  Medication Sig Dispense Refill  . ACCU-CHEK AVIVA PLUS test strip USE AS DIRECTED TO TEST ONCE DAILY.  50 each  6  . acetaminophen (TYLENOL) 500 MG tablet Take 500 mg by mouth every 6 (six)  hours as needed for mild pain.       Marland Kitchen albuterol (PROAIR HFA) 108 (90 BASE) MCG/ACT inhaler Inhale 2 puffs into the lungs every 6 (six) hours as needed for wheezing.  1 Inhaler  1  . ALPRAZolam (XANAX) 0.5 MG tablet Take 0.5 mg by mouth 2 (two) times daily as needed for anxiety.      Marland Kitchen aspirin EC 81 MG tablet Take 81 mg by mouth daily.       . cetirizine (ZYRTEC) 10 MG tablet Take 10 mg by mouth daily.       . citalopram (CELEXA) 20 MG tablet TAKE ONE TABLET BY MOUTH ONCE DAILY  90 tablet  0  . ferrous sulfate (FERROUSUL) 325 (65 FE) MG tablet Take 325 mg by mouth daily with breakfast.        . fluticasone (FLONASE) 50 MCG/ACT nasal spray  Place 2 sprays into both nostrils daily as needed for allergies.       Marland Kitchen glipiZIDE (GLUCOTROL XL) 2.5 MG 24 hr tablet Take 1 tablet (2.5 mg total) by mouth daily with breakfast.  30 tablet  5  . lisinopril (PRINIVIL,ZESTRIL) 2.5 MG tablet Take 2.5 mg by mouth daily.      . metoprolol (LOPRESSOR) 50 MG tablet Take 1 tablet (50 mg total) by mouth 2 (two) times daily.  180 tablet  1  . niacin (NIASPAN) 500 MG CR tablet Take 1,000 mg by mouth at bedtime.      . nitroGLYCERIN (NITROSTAT) 0.4 MG SL tablet Place 1 tablet (0.4 mg total) under the tongue as needed. Place 0.4 mg under the tongue every 5 (five) minutes as needed. Call MD if need more than 2  3 tablet  5  . pravastatin (PRAVACHOL) 80 MG tablet Take 80 mg by mouth daily.      . Probiotic Product (FLORAJEN3 PO) Take 1 tablet by mouth daily.      Marland Kitchen tiotropium (SPIRIVA) 18 MCG inhalation capsule Place 1 capsule (18 mcg total) into inhaler and inhale daily.  90 capsule  4  . Vitamin D, Ergocalciferol, (DRISDOL) 50000 UNITS CAPS capsule Take 50,000 Units by mouth every 30 (thirty) days.      . nitroGLYCERIN (NITROSTAT) 0.4 MG SL tablet Place 0.4 mg under the tongue.      . [DISCONTINUED] budesonide-formoterol (SYMBICORT) 160-4.5 MCG/ACT inhaler Inhale 2 puffs into the lungs 2 (two) times daily.         No current facility-administered medications for this visit.    Physical Exam BP 126/71  Pulse 67  Resp 18  Ht 6' (1.829 m)  Wt 270 lb (122.471 kg)  BMI 36.61 kg/m2  SpO2 14% Obese 80 year old male in no acute distress Alert and oriented x3 with no focal deficits Cardiac regular rate and rhythm Lungs bronchial breath sounds at right base otherwise clear  Diagnostic Tests: CT chest 12/13/2013 CT CHEST WITHOUT CONTRAST  TECHNIQUE:  Multidetector CT imaging of the chest was performed following the  standard protocol without IV contrast.  COMPARISON: Chest CT 27-Sep-202015. PET CT 10/03/2013.  FINDINGS:  Continued volume loss in the region  of postoperative changes in the  right lower lobe, stable since prior studies. No new airspace  opacities. Tiny 2 mm subpleural nodule in the right upper lobe on  image 34 is stable. No new pulmonary nodules or pleural effusions.  10 mm subcarinal lymph node on image 32 compared with 11 mm  previously. No mediastinal, hilar or axillary adenopathy. Chest wall  soft tissues are unremarkable.  Heart is normal size. Aorta is normal caliber. Coronary artery and  aortic calcifications. No evidence of aortic aneurysm.  Right adrenal nodule measures 3 cm, stable the nodule measures 6  Hounsfield units compatible with adenoma. Stable fusiform aneurysmal  dilatation of the abdominal aorta measuring transversely 4.5 cm.  No acute bony abnormality or focal bone lesion. Degenerative changes  and spurring throughout the thoracic spine.  IMPRESSION:  Stable postoperative changes and volume loss in the medial right  lower lobe. No evidence of recurrent or metastatic disease in the  chest.  Electronically Signed  By: Rolm Baptise M.D.  On: 12/13/2013 11:34  Impression: 80 year old gentleman with a history of lung cancer. He has persistent atelectasis in the right base on CT scan. Bronchoscopy failed to show any evidence of recurrent cancer. His CT scan today shows a persistent atelectasis although there does appear to be some improved aeration portion of the area previously consolidated.  At this point I would just go back to 6 month intervals for followup CTs, but will defer to [name]  Be happy to see [name] back if I can be of any further assistance with his care

## 2013-12-19 ENCOUNTER — Other Ambulatory Visit (HOSPITAL_COMMUNITY): Payer: Medicare Other

## 2013-12-19 ENCOUNTER — Encounter (HOSPITAL_COMMUNITY): Payer: Medicare Other | Attending: Oncology

## 2013-12-19 DIAGNOSIS — C3490 Malignant neoplasm of unspecified part of unspecified bronchus or lung: Secondary | ICD-10-CM | POA: Insufficient documentation

## 2013-12-19 DIAGNOSIS — C3430 Malignant neoplasm of lower lobe, unspecified bronchus or lung: Secondary | ICD-10-CM

## 2013-12-19 DIAGNOSIS — D693 Immune thrombocytopenic purpura: Secondary | ICD-10-CM

## 2013-12-19 DIAGNOSIS — C3491 Malignant neoplasm of unspecified part of right bronchus or lung: Secondary | ICD-10-CM

## 2013-12-19 LAB — CBC WITH DIFFERENTIAL/PLATELET
BASOS PCT: 0 % (ref 0–1)
Basophils Absolute: 0 10*3/uL (ref 0.0–0.1)
EOS ABS: 0.2 10*3/uL (ref 0.0–0.7)
EOS PCT: 4 % (ref 0–5)
HCT: 43.9 % (ref 39.0–52.0)
Hemoglobin: 14.3 g/dL (ref 13.0–17.0)
Lymphocytes Relative: 31 % (ref 12–46)
Lymphs Abs: 1.9 10*3/uL (ref 0.7–4.0)
MCH: 31.2 pg (ref 26.0–34.0)
MCHC: 32.6 g/dL (ref 30.0–36.0)
MCV: 95.6 fL (ref 78.0–100.0)
Monocytes Absolute: 0.4 10*3/uL (ref 0.1–1.0)
Monocytes Relative: 7 % (ref 3–12)
NEUTROS PCT: 59 % (ref 43–77)
Neutro Abs: 3.6 10*3/uL (ref 1.7–7.7)
PLATELETS: 98 10*3/uL — AB (ref 150–400)
RBC: 4.59 MIL/uL (ref 4.22–5.81)
RDW: 16.4 % — ABNORMAL HIGH (ref 11.5–15.5)
Smear Review: DECREASED
WBC: 6.2 10*3/uL (ref 4.0–10.5)

## 2013-12-19 LAB — COMPREHENSIVE METABOLIC PANEL
ALT: 11 U/L (ref 0–53)
AST: 12 U/L (ref 0–37)
Albumin: 3.6 g/dL (ref 3.5–5.2)
Alkaline Phosphatase: 65 U/L (ref 39–117)
BUN: 27 mg/dL — ABNORMAL HIGH (ref 6–23)
CALCIUM: 9.1 mg/dL (ref 8.4–10.5)
CO2: 28 meq/L (ref 19–32)
CREATININE: 1.82 mg/dL — AB (ref 0.50–1.35)
Chloride: 108 mEq/L (ref 96–112)
GFR calc Af Amer: 40 mL/min — ABNORMAL LOW (ref >90)
GFR, EST NON AFRICAN AMERICAN: 34 mL/min — AB (ref >90)
Glucose, Bld: 131 mg/dL — ABNORMAL HIGH (ref 70–99)
Potassium: 4.7 mEq/L (ref 3.7–5.3)
SODIUM: 145 meq/L (ref 137–147)
TOTAL PROTEIN: 6.8 g/dL (ref 6.0–8.3)
Total Bilirubin: 1.3 mg/dL — ABNORMAL HIGH (ref 0.3–1.2)

## 2013-12-19 NOTE — Progress Notes (Signed)
Labs drawn

## 2013-12-21 ENCOUNTER — Encounter (HOSPITAL_BASED_OUTPATIENT_CLINIC_OR_DEPARTMENT_OTHER): Payer: Medicare Other

## 2013-12-21 ENCOUNTER — Encounter (HOSPITAL_COMMUNITY): Payer: Self-pay

## 2013-12-21 VITALS — BP 125/69 | HR 59 | Temp 97.8°F | Resp 20 | Wt 271.3 lb

## 2013-12-21 DIAGNOSIS — D693 Immune thrombocytopenic purpura: Secondary | ICD-10-CM

## 2013-12-21 DIAGNOSIS — C679 Malignant neoplasm of bladder, unspecified: Secondary | ICD-10-CM

## 2013-12-21 DIAGNOSIS — C3491 Malignant neoplasm of unspecified part of right bronchus or lung: Secondary | ICD-10-CM

## 2013-12-21 DIAGNOSIS — D696 Thrombocytopenia, unspecified: Secondary | ICD-10-CM

## 2013-12-21 DIAGNOSIS — C3490 Malignant neoplasm of unspecified part of unspecified bronchus or lung: Secondary | ICD-10-CM

## 2013-12-21 NOTE — Patient Instructions (Signed)
[name] Discharge Instructions  RECOMMENDATIONS MADE BY THE CONSULTANT AND ANY TEST RESULTS WILL BE SENT TO YOUR REFERRING PHYSICIAN.  EXAM FINDINGS BY THE PHYSICIAN TODAY AND SIGNS OR SYMPTOMS TO REPORT TO CLINIC OR PRIMARY PHYSICIAN: you seen Dr Barnet Glasgow Today   followup in 6 months with labs    Thank you for choosing [name] to provide your oncology and hematology care.  To afford each patient quality time with our providers, please arrive at least 15 minutes before your scheduled appointment time.  With your help, our goal is to use those 15 minutes to complete the necessary work-up to ensure our physicians have the information they need to help with your evaluation and healthcare recommendations.    Effective January 1st, 2014, we ask that you re-schedule your appointment with our physicians should you arrive 10 or more minutes late for your appointment.  We strive to give you quality time with our providers, and arriving late affects you and other patients whose appointments are after yours.    Again, thank you for choosing [name].  Our hope is that these requests will decrease the amount of time that you wait before being seen by our physicians.       _____________________________________________________________  Should you have questions after your visit to [facility], please contact our office at [phone] between the hours of 8:30 a.m. and 5:00 p.m.  Voicemails left after 4:30 p.m. will not be returned until the following business day.  For prescription refill requests, have your pharmacy contact our office with your prescription refill request.

## 2013-12-21 NOTE — Progress Notes (Signed)
[facility]  OFFICE PROGRESS NOTE  [name], MD [place] [address]  DIAGNOSIS: Adenocarcinoma of right lung - Plan: Comprehensive metabolic panel  Thrombocytopenia  ITP (idiopathic thrombocytopenic purpura)  Bladder cancer  Chief Complaint  Patient presents with  . Lung Cancer  . ITP  . Bladder tumor    CURRENT THERAPY: Surveillance per NCCN guidelines.  INTERVAL HISTORY: Kyle [name] 80 y.o. male returns for followup of stage I-A (T1 B. N0 M0) moderately differentiated adenocarcinoma lung, 2.3 cm in size, status post surgery 04/03/2011 with negative margins, no LV I. and no pleural involvement, 6 lymph nodes all negative here for followup after repeat CT scan of the chest on 12/13/2013 in the setting of chronic immune thrombocytopenia. There is also history of bladder cancer followed by Dr. Rosana Hoes. He had undergone bronchoscopy in March of 2015 because of abnormalities found on CT scan which turned out to be  atelectasis secondary to inspissated mucus material with no evidence of malignancy. He does have occasional wheezing without PND, orthopnea, or palpitations. He plans to begin pulmonary rehabilitation in the near future. He no longer smokes. He denies a sore throat, chest pain, PND, orthopnea, palpitations, but does have slight right lower 70 swelling with pain in the right knee. He denies any diarrhea, constipation, melena, hematochezia, hematuria, peripheral paresthesias, incontinence, skin rash, headache, or seizures.   MEDICAL HISTORY: Past Medical History  Diagnosis Date  . Arteriosclerotic cardiovascular disease (ASCVD) 1973, 12/2010    S/P NSTEMI secondary to distal RCA/PL lesion, tx medically.  EF of  55%-60% per  echo.  . Diabetes mellitus     Type II  . Thrombocytopenia   . COPD (chronic obstructive pulmonary disease)   . AAA (abdominal aortic aneurysm) 2004    s/p repair 2004; 4.3 cm  infrarenal in 05/2011  . Tobacco abuse     50-pack-year consumption; quit in 12/2010  . Hyperlipidemia   . OSA (obstructive sleep apnea)   . Hypertension   . Benign prostatic hypertrophy     s/p transurethral resection of the prostate  . Obesity   . Tubular adenoma of colon   . Cataract   . Ventral hernia   . Diverticulosis   . Bilateral renal masses     Cystic, more prominent on CT in 12/2010 than 2007; followed by Dr. Rosana Hoes  . ITP (idiopathic thrombocytopenic purpura) 09/07/2012    Chronic ITP of adults versus medication-induced ITP.  Stable  . Insomnia   . Bladder cancer 1996    Transurethral resection of the bladder + chemotherapy/BCG as premed  . Adenocarcinoma, lung 01/2011    transthoracic FNA; resection of the superior segment of the RLL in 03/2011; negative nodes; no chemotherapy nor radiation planned  . Chronic kidney disease     Creatinine 1.4 on discharge 12/20/2100; proteinuria; normal renal ultrasound in 2010; recent creatinines of 1.7-2.; Bilateral cystic renal masses by CT in 2011  . Nephrolithiasis 2012    ARF in 01/2011 due to obstructing nephrolithiasis  . Arm fracture     right arm  . Essential and other specified forms of tremor 02/23/2013  . Diplopia 02/23/2013  . Polyneuropathy in diabetes(357.2) 02/23/2013  . Abnormality of gait 02/23/2013  . Hx of Clostridium difficile infection   . Adenocarcinoma of right lung 02/24/2011    Ct A/P 2012:  2cm lung mass RLL PET 0109:  Hypermetabolic RLL mass, no other hypermetabolic  areas. TTNA 02/2011:  Adenocarcinoma, markers c/w lung origin Right lower lobe superior segmentectomy. 04/01/2011 Dr. Arlyce Dice   . Coronary artery disease   . Myocardial infarction   . Cough     thick phlegm    INTERIM HISTORY: has Arteriosclerotic cardiovascular disease (ASCVD); Hypertension; CKD (chronic kidney disease) stage 3, GFR 30-59 ml/min; COPD (chronic obstructive pulmonary disease); Adenocarcinoma of right lung; Obstructive sleep apnea; Fasting  hyperglycemia; Nephrolithiasis; Bladder carcinoma; Diarrhea; Tubular adenoma of colon; Ventral hernia; AAA (abdominal aortic aneurysm); Tobacco abuse; LLQ pain; ITP (idiopathic thrombocytopenic purpura); Type II or unspecified type diabetes mellitus with neurological manifestations, not stated as uncontrolled(250.60); Fracture of right distal radius; COPD exacerbation; Essential and other specified forms of tremor; Diplopia; Polyneuropathy in diabetes(357.2); and Abnormality of gait on his problem list.   Stage I a (T1 B., N0, M0) moderately differentiated adenocarcinoma the lung, 2.3 cm in size with surgery on 04/03/2011. Margins were clear, no LV I was seen, and there is no pleural involvement. 6 lymph nodes were all negative getting stage IA disease.  AND chronic immune thrombocytopenia thus far consistent with chronic ITP of adults versus drug-induced mild thrombocytopenia.    Adenocarcinoma of right lung    04/03/2011  Initial Diagnosis  INVASIVE MODERATELY DIFFERENTIATED ADENOCARCINOMA, 2.3 CM. (T1b, N0). Clear margins, no LVI, no pleural involvement. 0/6 nodes.    04/03/2011  Surgery  Right lower lobe superior segmentectomy by Dr. Arlyce Dice    04/04/2011  Remission     03/07/2013  Imaging  CT of chest- developing adenopathy in the subcarinal and azygo-esophageal recess stations, mild. Findings are suspicious for recurrent or localized metastatic disease.    03/16/2013  Imaging  PET scan- No definite metabolically active pulmonary lesion and no mediastinal or hilar lymphadenopathy.    06/15/2013  Imaging  CT scan of chest- Ill-defined pulmonary density in RLL in the region of patient's prior surgery, less prominent than on prior CT obtained for PET-CT of 02/25/2013. This is most likely region ofscarring. Underlying tumor cannot be excluded    09/19/2013  Imaging  CT of chest- Progressive volume loss medially at the right lung base. Differential diagnostic considerations include mucus plugging, radiation  therapy related findings, or progressive bronchogenic carcinoma    10/03/2013  Imaging  PET scan- Low level residual FDG uptake in the area of previous surgery with moderate atelectasis. I do not see any findings suspicious for recurrent or residual tumor.    10/10/2013  Procedure  Bronchcospy with biopsies and brushings by Dr. Roxan Hockey    10/10/2013  Pathology  RLL biopsies are negative for malignancy       Patient Information    Patient Name Sex DOB SSN   Kyle [name], Kyle [name] Male Mar 21, 1934 [id number]         Transcription    Type ID Status Author   Operative Note [id number] Authenticated Melrose Nakayama, MD       Signed by Melrose Nakayama, MD on 10/20/13 at 1314       Transcription Text   NAME: ESTES, LEHNER ACCOUNT NO.: [id number]  MEDICAL RECORD NO.: [id number]  LOCATION: [facility] FACILITY: [facility]  PHYSICIAN: Revonda Standard. Roxan Hockey, M.D.DATE OF BIRTH: June 17, 1934  DATE OF PROCEDURE: 10/10/2013  DATE OF DISCHARGE: 10/10/2013  OPERATIVE REPORT  PREOPERATIVE DIAGNOSIS: Right lower lobe atelectasis.  POSTOPERATIVE DIAGNOSIS: Right lower lobe atelectasis, likely  recurrence.  PROCEDURE: Video bronchoscopy with endobronchial navigation with  brushings, biopsies, and washings.  SURGEON: Revonda Standard. Roxan Hockey, MD  ASSISTANT: None.  ANESTHESIA: General.  FINDINGS: Thick purulent secretions in the right lower lobe, mass  lesion what was appeared to be the take off the superior segmental  bronchus. Biopsies and brushings performed, pathology pending.  CLINICAL NOTE: Kyle [name] is a 80 year old gentleman with a history of  lung cancer. He previously had a right lower lobe superior  segmentectomy. He now presents with persistent right lower lobe  atelectasis. A PET scan showed generalized hypermetabolism in the area, but the radiologist was not concerned about recurrence. The patient was advised  to undergo bronchoscopy for further evaluation. The indications, risks,  benefits,  and alternatives were discussed in detail with the patient.  He understood and accepted the risks and agreed to proceed.  OPERATIVE NOTE: Kyle [name] was brought to the operating room on October 10, 2013. He had an induction of general anesthesia and was  intubated. Flexible fiberoptic bronchoscopy was performed via the  endotracheal tube. There were thick purulent secretions. These were  sent for both culture and cytology. The trachea and left bronchial tree  were within normal limits with no endobronchial lesions. The scope was  then passed into the right mainstem bronchus which was normal as was the  right upper lobe bronchus to the level of subsegmental bronchi. The  bronchus intermedius was normal. As the bifurcation of the middle and  lower lobe was approached, there was a mass seen laterally where the  origin of the superior segmental bronchus should be. The patient had a  previous superior segmentectomy. This did not have the normal appearance of a staple line. There  was a question whether there was a small stump of the segmental bronchus left at the time of the  original resection. Inspection of the middle lobe bronchus was  unremarkable. There were thick purulent secretions in the lower lobe  bronchi, these were copiously irrigated and suctioned. Some biopsies  were obtained from that area, although the bronchial mucosa appeared  relatively normal. Next, attention was turned to the origin of the  superior segmental bronchus. No definite bronchus could be seen, but  there was a mass lesion in that area. No staple line was evident. The navigational probe then was advanced and  mapping was performed. There was good correlation. The mapping  attempted to have the probe go out past the endobronchial mass into the  superior segmental bronchus, but this was not possible due to his  previous surgery. There were no other suspicious findings. The  navigational probe was removed. Next,  biopsies and brushings were  performed of the mass. This was friable and bled easily. Dilute  epinephrine was applied to assist with hemostasis. No attempt was made  to completely excise the mass or to pass the mass with a probe or  bronchoscope. After  obtaining what was felt to be sufficient biopsy material, the specimens were sent for  permanent pathology. A final inspection was made for hemostasis. The  scope was withdrawn. The patient was taken from the operating room to  the postanesthetic care unit in good condition.  Revonda Standard Roxan Hockey, M.D.  SCH/MEDQ D: 10/10/2013 T: 03    ALLERGIES:  is allergic to codeine and etodolac.  MEDICATIONS: has a current medication list which includes the following prescription(s): accu-chek aviva plus, acetaminophen, albuterol, alprazolam, aspirin ec, cetirizine, citalopram, ferrous sulfate, fluticasone, glipizide, lisinopril, metoprolol, niacin, nitroglycerin, nitroglycerin, pravastatin, probiotic product, tiotropium, and vitamin d (ergocalciferol).  SURGICAL HISTORY:  Past Surgical History  Procedure Laterality Date  . Transurethral resection of prostate    .  Cystectomy    . Tonsillectomy    . Colonoscopy  03/19/2010    Dr. Gala Romney -(poor prep) Anal papilla, rectal hyperplastic polyp, tubular adenoma removed splenic flexure, left-sided diverticula  . Wedge resection  04/2011    carcinoma of lung  . Colonoscopy  11/28/2004    RMR:  Diminutive rectal and left colon polyps as described above, cold  biopsied/removed/  Left sided diverticula. The remainder of the colonic mucosa appeared normal.  . Colonoscopy   09/15/01    RMR: Multiple diminutive polyps destroyed with dermolysis as described above/ Multiple small polyps on stalks in the colon resected with snare cautery/ Scattered pan colonic diverticulum/ The remainder of the colonic mucosa appeared normal  . Abdominal aortic aneurysm repair  2004  . Cardiac catheterization    . Lung lobectomy      . Video bronchoscopy with endobronchial navigation N/A 10/10/2013    Procedure: VIDEO BRONCHOSCOPY WITH ENDOBRONCHIAL NAVIGATION;  Surgeon: Melrose Nakayama, MD;  Location: [facility] OR;  Service: Thoracic;  Laterality: N/A;  NO BLOOD THINNERS BUT PATIENT HAS ITP    FAMILY HISTORY: family history includes Aortic aneurysm in his father and mother; Arthritis in his father; Clotting disorder in his father; Diabetes in his father; Emphysema in his father; Hypertension in his father; Other in his paternal grandfather; Stroke in his paternal grandmother; Tremor in his father.  SOCIAL HISTORY:  reports that he quit smoking about a year ago. His smoking use included Cigarettes. He has a 50 pack-year smoking history. He has never used smokeless tobacco. He reports that he does not drink alcohol or use illicit drugs.  REVIEW OF SYSTEMS:  Other than that discussed above is noncontributory.  PHYSICAL EXAMINATION: ECOG PERFORMANCE STATUS: 1 - Symptomatic but completely ambulatory  Blood pressure 125/69, pulse 59, temperature 97.8 F (36.6 C), temperature source Oral, resp. rate 20, weight 271 lb 4.8 oz (123.061 kg).  GENERAL:alert, no distress and comfortable. Moderately obese. SKIN: skin color, texture, turgor are normal, no rashes or significant lesions EYES: PERLA; Conjunctiva are pink and non-injected, sclera clear SINUSES: No redness or tenderness over maxillary or ethmoid sinuses OROPHARYNX:no exudate, no erythema on lips, buccal mucosa, or tongue. NECK: supple, thyroid normal size, non-tender, without nodularity. No masses CHEST: Increased AP diameter with no gynecomastia. LYMPH:  no palpable lymphadenopathy in the cervical, axillary or inguinal LUNGS: clear to auscultation and percussion with normal breathing effort HEART: regular rate & rhythm and no murmurs. ABDOMEN:abdomen soft, non-tender and normal bowel sounds MUSCULOSKELETAL:no cyanosis of digits and no clubbing. Range of motion normal.  Decreased range of motion of the right knee without swelling.  NEURO: alert & oriented x 3 with fluent speech, no focal motor/sensory deficits   LABORATORY DATA: Infusion on 12/19/2013  Component Date Value Ref Range Status  . WBC 12/19/2013 6.2  4.0 - 10.5 K/uL Final  . RBC 12/19/2013 4.59  4.22 - 5.81 MIL/uL Final  . Hemoglobin 12/19/2013 14.3  13.0 - 17.0 g/dL Final  . HCT 12/19/2013 43.9  39.0 - 52.0 % Final  . MCV 12/19/2013 95.6  78.0 - 100.0 fL Final  . MCH 12/19/2013 31.2  26.0 - 34.0 pg Final  . MCHC 12/19/2013 32.6  30.0 - 36.0 g/dL Final  . RDW 12/19/2013 16.4* 11.5 - 15.5 % Final  . Platelets 12/19/2013 98* 150 - 400 K/uL Final   Comment: SPECIMEN CHECKED FOR CLOTS  PLATELET COUNT CONFIRMED BY SMEAR  . Neutrophils Relative % 12/19/2013 59  43 - 77 % Final  . Neutro Abs 12/19/2013 3.6  1.7 - 7.7 K/uL Final  . Lymphocytes Relative 12/19/2013 31  12 - 46 % Final  . Lymphs Abs 12/19/2013 1.9  0.7 - 4.0 K/uL Final  . Monocytes Relative 12/19/2013 7  3 - 12 % Final  . Monocytes Absolute 12/19/2013 0.4  0.1 - 1.0 K/uL Final  . Eosinophils Relative 12/19/2013 4  0 - 5 % Final  . Eosinophils Absolute 12/19/2013 0.2  0.0 - 0.7 K/uL Final  . Basophils Relative 12/19/2013 0  0 - 1 % Final  . Basophils Absolute 12/19/2013 0.0  0.0 - 0.1 K/uL Final  . Smear Review 12/19/2013 PLATELETS APPEAR DECREASED   Final  . Sodium 12/19/2013 145  137 - 147 mEq/L Final  . Potassium 12/19/2013 4.7  3.7 - 5.3 mEq/L Final  . Chloride 12/19/2013 108  96 - 112 mEq/L Final  . CO2 12/19/2013 28  19 - 32 mEq/L Final  . Glucose, Bld 12/19/2013 131* 70 - 99 mg/dL Final  . BUN 12/19/2013 27* 6 - 23 mg/dL Final  . Creatinine, Ser 12/19/2013 1.82* 0.50 - 1.35 mg/dL Final  . Calcium 12/19/2013 9.1  8.4 - 10.5 mg/dL Final  . Total Protein 12/19/2013 6.8  6.0 - 8.3 g/dL Final  . Albumin 12/19/2013 3.6  3.5 - 5.2 g/dL Final  . AST 12/19/2013 12  0 - 37 U/L Final  . ALT 12/19/2013 11   0 - 53 U/L Final  . Alkaline Phosphatase 12/19/2013 65  39 - 117 U/L Final  . Total Bilirubin 12/19/2013 1.3* 0.3 - 1.2 mg/dL Final  . GFR calc non Af Amer 12/19/2013 34* >90 mL/min Final  . GFR calc Af Amer 12/19/2013 40* >90 mL/min Final   Comment: (NOTE)                          The eGFR has been calculated using the CKD EPI equation.                          This calculation has not been validated in all clinical situations.                          eGFR's persistently <90 mL/min signify possible Chronic Kidney                          Disease.    PATHOLOGY: Adenocarcinoma lung  Urinalysis    Component Value Date/Time   COLORURINE YELLOW 10/06/2013 1527   APPEARANCEUR CLOUDY* 10/06/2013 1527   LABSPEC 1.018 10/06/2013 1527   PHURINE 5.5 10/06/2013 1527   GLUCOSEU NEGATIVE 10/06/2013 1527   HGBUR LARGE* 10/06/2013 1527   BILIRUBINUR NEGATIVE 10/06/2013 1527   KETONESUR NEGATIVE 10/06/2013 1527   PROTEINUR 100* 10/06/2013 1527   UROBILINOGEN 1.0 10/06/2013 1527   NITRITE NEGATIVE 10/06/2013 1527   LEUKOCYTESUR MODERATE* 10/06/2013 1527    RADIOGRAPHIC STUDIES: Ct Chest Wo Contrast  12/13/2013   CLINICAL DATA:  Lung cancer.  EXAM: CT CHEST WITHOUT CONTRAST  TECHNIQUE: Multidetector CT imaging of the chest was performed following the standard protocol without IV contrast.  COMPARISON:  Chest CT 02/21/202015.  PET CT 10/03/2013.  FINDINGS: Continued volume loss in the region of postoperative changes in the right  lower lobe, stable since prior studies. No new airspace opacities. Tiny 2 mm subpleural nodule in the right upper lobe on image 34 is stable. No new pulmonary nodules or pleural effusions.  10 mm subcarinal lymph node on image 32 compared with 11 mm previously. No mediastinal, hilar or axillary adenopathy. Chest wall soft tissues are unremarkable.  Heart is normal size. Aorta is normal caliber. Coronary artery and aortic calcifications. No evidence of aortic aneurysm.  Right adrenal nodule  measures 3 cm, stable the nodule measures 6 Hounsfield units compatible with adenoma. Stable fusiform aneurysmal dilatation of the abdominal aorta measuring transversely 4.5 cm.  No acute bony abnormality or focal bone lesion. Degenerative changes and spurring throughout the thoracic spine.  IMPRESSION: Stable postoperative changes and volume loss in the medial right lower lobe. No evidence of recurrent or metastatic disease in the chest.   Electronically Signed   By: Rolm Baptise M.D.   On: 12/13/2013 11:34    ASSESSMENT:  1. Stage I a (T1 B., N0, M0) moderately differentiated adenocarcinoma the lung, 2.3 cm in size with surgery on 04/03/2011. Margins were clear, no LV I was seen, and there is no pleural involvement. 6 lymph nodes were all negative getting stage IA disease.  2. Thrombocytopenia thus far consistent with chronic ITP of adults versus drug-induced mild thrombocytopenia. He is on too many medications to withdraw them completely. There is no evidence on his May 2013 CT scan of splenomegaly or cirrhosis. Stable.  3. Obesity  4. Intermittent diarrhea treated by GI  5. Bladder cancer followed by Dr. Rosana Hoes  6. MI x2 the last in May 2012  7. Abdominal hernia secondary to abdominal aortic aneurysm surgery in 2008  8. Double vision in the past which has not been a recent issue  9. History of skin cancer  10. Chronic renal disease, grade 3, followed by nephrologist  11. Elevated glucose, followed by Dr. Wolfgang Phoenix.  12. S/P bronchoscopy by Dr. Roxan Hockey on 10/10/2013 with biopsies and they are negative.     PLAN:  #1. Encouraged pulmonary toilet by instructing him to tell his wife to pat his back e.g. evening to loosen his secretions. #2. Proceed with pulmonary rehabilitation. #3. Followup in 6 months with CBC and chem profile. Repeat CAT scan in one year. His   All questions were answered. The patient knows to call the clinic with any problems, questions or concerns. We can certainly see  the patient much sooner if necessary.   I spent 25 minutes counseling the patient face to face. The total time spent in the appointment was 30 minutes.    Farrel Gobble, MD 12/21/2013 11:41 AM  DISCLAIMER:  This note was dictated with voice recognition software.  Similar sounding words can inadvertently be transcribed inaccurately and may not be corrected upon review.

## 2014-01-04 ENCOUNTER — Other Ambulatory Visit: Payer: Self-pay | Admitting: Family Medicine

## 2014-01-10 ENCOUNTER — Encounter (HOSPITAL_COMMUNITY)
Admission: RE | Admit: 2014-01-10 | Discharge: 2014-01-10 | Disposition: A | Discharge status: Home / Self Care | Payer: Medicare Other | Source: Ambulatory Visit | Attending: Pulmonary Disease | Admitting: Pulmonary Disease

## 2014-01-10 VITALS — BP 112/72 | HR 65 | Ht 72.0 in | Wt 276.8 lb

## 2014-01-10 DIAGNOSIS — J438 Other emphysema: Secondary | ICD-10-CM | POA: Insufficient documentation

## 2014-01-10 DIAGNOSIS — Z5189 Encounter for other specified aftercare: Secondary | ICD-10-CM | POA: Insufficient documentation

## 2014-01-10 NOTE — Patient Instructions (Signed)
Pt has finished orientation and is scheduled to start PR on 01/16/14 at 2:30pm. Pt has been instructed to arrive to class 15 minutes early for scheduled class. Pt has been instructed to wear comfortable clothing and shoes with rubber soles. Pt has been told to take their medications 1 hour prior to coming to class.  If the patient is not going to attend class, he/she has been instructed to call.

## 2014-01-10 NOTE — Psychosocial Assessment (Cosign Needed)
Kyle [name] 80 y.o. male  Initial Psychosocial Assessment  Pt psychosocial assessment reveals pt lives with their spouse. Pt is currently retired. Pt hobbies include gardening and card playing. Pt reports his stress level is low. Areas of stress/anxiety include Family.  Pt does not exhibit signs of depression. There are no signs of depression. Pt shows good  coping skills with positive outlook . Offered emotional support and reassurance. Monitor and evaluate progress toward psychosocial goal(s).  Goal(s): Improved management of stress surrounding family.  Help patient work toward returning to meaningful activities that improve patient's QOL and are attainable with patient's lung disease. Also work at increasing patient's mobility.    01/10/2014 1:46 PM

## 2014-01-10 NOTE — Progress Notes (Signed)
Patient was referred to PR by Dr. Danton Sewer due to emphysema 492.8. During orientation advised patient on arrival and appointment times what to wear, what to do before, during and after exercise. Reviewed attendance and class policy. Talked about inclement weather and class consultation policy. Pt is scheduled to start Pulm Rehab on 01/16/14 at 2:30 pm. Pt was advised to come to class 5 minutes before class starts. He was also given instructions on meeting with the dietician and attending the Family Structure classes. Pt is eager to get started. Patient was able to complete 5 minutes of the 6 minute walk test. Had to stop at 5 minutes due to hip and leg pain. Discussed pursed lip breathing and handout given to demonstrate technique.

## 2014-01-12 DIAGNOSIS — C672 Malignant neoplasm of lateral wall of bladder: Secondary | ICD-10-CM | POA: Insufficient documentation

## 2014-01-15 ENCOUNTER — Other Ambulatory Visit: Payer: Self-pay | Admitting: Family Medicine

## 2014-01-16 ENCOUNTER — Encounter (HOSPITAL_COMMUNITY)
Admission: RE | Admit: 2014-01-16 | Discharge: 2014-01-16 | Disposition: A | Discharge status: Home / Self Care | Payer: Medicare Other | Source: Ambulatory Visit | Attending: Pulmonary Disease | Admitting: Pulmonary Disease

## 2014-01-18 ENCOUNTER — Encounter (HOSPITAL_COMMUNITY)
Admission: RE | Admit: 2014-01-18 | Discharge: 2014-01-18 | Disposition: A | Discharge status: Home / Self Care | Payer: Medicare Other | Source: Ambulatory Visit | Attending: Pulmonary Disease | Admitting: Pulmonary Disease

## 2014-01-18 DIAGNOSIS — J438 Other emphysema: Secondary | ICD-10-CM | POA: Insufficient documentation

## 2014-01-18 DIAGNOSIS — Z5189 Encounter for other specified aftercare: Secondary | ICD-10-CM | POA: Insufficient documentation

## 2014-01-20 NOTE — Progress Notes (Signed)
[facility] Pulmonary Rehabilitation Baseline Outcomes Assessment   Anthropometrics:    Height (inches): 72   Weight (kg): 125.5    Grip strength was measured using a Dynamometer.  The patient's highest score was a 71 .  Functional Status/Exercise Capacity:   Kyle [name] had a resting heart rate of 65 BPM, a resting blood pressure of 112/72, Kyle an oxygen saturation of 94% on 0 liters of O2.  Kyle [name] performed a 6-minute walk test on 0 liters of O2.  The patient completed 800 feet in 6 minutes with 1 rest break.  This quantifies 2.2 METS.   Kyle [name]:   The [facility] is a simple Kyle standardized method of classifying disability in patients with COPD.  The assessment correlates disability Kyle Kyle.  At entrance the patient scored a 3. The scale is provided below.   0= I only get breathless with strenuous exercise. 1= I get short of breath when hurrying on level ground or walking up a slight incline. 2= On level ground, I walk slower than people of the same age because of breathlessness, or have to stop for breath when walking at my own pace. 3= I stop for breath after walking 100 yards or after a few minutes on level ground. 4=I am too breathless to leave the house or I am breathless when dressing.     The patient completed the [place] ([facility]).  This questionnaire relates activities of daily living Kyle shortness of breath.  The score ranges from 0-120, a higher score relates to severe shortness of breath during activities of daily living. The patient's score at entrance was 60.  Quality of Life:   Kyle [name], Kyle [name], Kyle [name], Kyle family. The overall score is recorded out of 30 points.  The patient's goal is to achieve an overall score of 21 or higher.   [name] received a 20.85 at entrance.    The Patient Health Questionnaire (PHQ-2) is a first step approach for the screening of depression.  If the patient scores positive on the PHQ-2 the patient should be further assessed with the PHQ-9.  The Patient Health Questionnaire (PHQ-9) assesses the degree of depression.  Depression is important to monitor Kyle track in pulmonary patients due to its prevalence in the population.  If the patient advances to the PHQ-9 the goal is to score less than 4 on this assessment.  Kyle [name] scored a 1 at entrance.  Clinical Assessment Tools:   The COPD Assessment Test (CAT) is a measurement tool to quantify how much of an impact the disease has on the patient's life.  This assessment aids the Pulmonary Rehab Team in designing the patients individualized treatment plan.  A CAT score ranges from 0-40.  A score of 10 or below indicates that COPD has a low impact on the patient's life whereas a score of 30 or higher indicates a severe impact. The patient's goal is a decrease of 1 point from entrance to discharge.  Kyle [name] had a CAT score of 24 at entrance.  Nutrition:   The "Rate My Plate" is a dietary assessment that quantifies the balance of a patient's diet.  This tool allows the Pulmonary Rehab Team to key in on the areas of the patient's diet that needs improving.  The team can then focus their nutritional education on those  areas.  If the patient scores 24-40, this means there are many ways they can make their eating habits healthier, 41-57 states that there are some ways they can make their eating habits healthier Kyle a score of 58-72 states that they are making many healthy choices.  The patient's goal is to achieve a score of 49 or higher on this assessment.  Kyle [name] scored a 33 at entrance.  Oxygen Compliance:   Patient is currently on 0 liters at rest, 2.5 liters at night, Kyle 0 liters for exercise.  Kyle [name] is currently using a cpap at night.  The patient is currently  compliant.  The patient states that they do not have barriers that keep them from using their oxygen.  There are no barriers.   Education:   Kyle [name] will attend education classes during the course of Pulmonary Rehab.  Education classes that will be offered to the patient are Activities of Daily Living Kyle Energy Conservation, Pursed Lip Breathing Kyle Diaphragmatic Breathing, Nutrition, Exercise for the Pulmonary Patient, Warning Signs of Infection, Chronic Lung Disease, Advanced Directives, Medications, Kyle Stress Kyle Meditation.  The patient completed an assessment at the entrance of the program Kyle will complete it again upon discharge to demonstrate the level of understanding provided by the educational classes.  This assessment includes 14 questions regarding all of the education topics above.  [name] achieved a score of 12/14 at entrance.  Smoking Cessation:  Patient is currently not smoking.   Exercise:  [name] will be provided with an individualized Home Exercise Prescription (HEP) at the entrance of the program.  The patient will be followed by the Pulmonary Exercise Physiologist throughout the program to assist with the progression of the frequency, intensity, time, Kyle type of exercise. The patient's long-term goal is to be exercising 30-60 minutes, 3-5 days per week. At entrance, the patient was exercising 0 days at home.      Dr. Jamesetta So Koneswaran___________________  Date Signed_____________ Medical Director

## 2014-01-23 ENCOUNTER — Encounter (HOSPITAL_COMMUNITY)
Admission: RE | Admit: 2014-01-23 | Discharge: 2014-01-23 | Disposition: A | Discharge status: Home / Self Care | Payer: Medicare Other | Source: Ambulatory Visit | Attending: Pulmonary Disease | Admitting: Pulmonary Disease

## 2014-01-23 DIAGNOSIS — Z5189 Encounter for other specified aftercare: Secondary | ICD-10-CM | POA: Diagnosis not present

## 2014-01-25 ENCOUNTER — Encounter (HOSPITAL_COMMUNITY)
Admission: RE | Admit: 2014-01-25 | Discharge: 2014-01-25 | Disposition: A | Discharge status: Home / Self Care | Payer: Medicare Other | Source: Ambulatory Visit | Attending: Pulmonary Disease | Admitting: Pulmonary Disease

## 2014-01-25 DIAGNOSIS — Z5189 Encounter for other specified aftercare: Secondary | ICD-10-CM | POA: Diagnosis not present

## 2014-01-30 ENCOUNTER — Encounter (HOSPITAL_COMMUNITY)
Admission: RE | Admit: 2014-01-30 | Discharge: 2014-01-30 | Disposition: A | Discharge status: Home / Self Care | Payer: Medicare Other | Source: Ambulatory Visit | Attending: Pulmonary Disease | Admitting: Pulmonary Disease

## 2014-01-30 DIAGNOSIS — Z5189 Encounter for other specified aftercare: Secondary | ICD-10-CM | POA: Diagnosis not present

## 2014-02-01 ENCOUNTER — Encounter (HOSPITAL_COMMUNITY)
Admission: RE | Admit: 2014-02-01 | Discharge: 2014-02-01 | Disposition: A | Discharge status: Home / Self Care | Payer: Medicare Other | Source: Ambulatory Visit | Attending: Pulmonary Disease | Admitting: Pulmonary Disease

## 2014-02-01 DIAGNOSIS — Z5189 Encounter for other specified aftercare: Secondary | ICD-10-CM | POA: Diagnosis not present

## 2014-02-06 ENCOUNTER — Encounter (HOSPITAL_COMMUNITY): Payer: Medicare Other

## 2014-02-07 ENCOUNTER — Ambulatory Visit (INDEPENDENT_AMBULATORY_CARE_PROVIDER_SITE_OTHER): Payer: Medicare Other | Admitting: Family Medicine

## 2014-02-07 ENCOUNTER — Encounter: Payer: Self-pay | Admitting: Family Medicine

## 2014-02-07 VITALS — BP 130/74 | Ht 72.0 in | Wt 276.1 lb

## 2014-02-07 DIAGNOSIS — G4733 Obstructive sleep apnea (adult) (pediatric): Secondary | ICD-10-CM

## 2014-02-07 DIAGNOSIS — I10 Essential (primary) hypertension: Secondary | ICD-10-CM

## 2014-02-07 DIAGNOSIS — I7090 Unspecified atherosclerosis: Secondary | ICD-10-CM

## 2014-02-07 DIAGNOSIS — I2510 Atherosclerotic heart disease of native coronary artery without angina pectoris: Secondary | ICD-10-CM

## 2014-02-07 DIAGNOSIS — E119 Type 2 diabetes mellitus without complications: Secondary | ICD-10-CM

## 2014-02-07 LAB — POCT GLYCOSYLATED HEMOGLOBIN (HGB A1C): Hemoglobin A1C: 5.8

## 2014-02-07 NOTE — Progress Notes (Signed)
   Subjective:    Patient ID: Kyle [name], male    DOB: 11/10/1933, 80 y.o.   MRN: [id number]  Diabetes He presents for his follow-up diabetic visit. He has type 2 diabetes mellitus. His disease course has been stable. There are no hypoglycemic associated symptoms. There are no diabetic associated symptoms. There are no hypoglycemic complications. Symptoms are stable. There are no diabetic complications. There are no known risk factors for coronary artery disease. Current diabetic treatment includes oral agent (dual therapy). He is compliant with treatment all of the time.  A1C is 5.8 today.  Patient states he has atypical cells in his urine and he has a follow up visit with Dr. Rosana Hoes on Tuesday for further evaluation concerning this issue.   Results for orders placed in visit on 02/07/14  POCT GLYCOSYLATED HEMOGLOBIN (HGB A1C)      Result Value Ref Range   Hemoglobin A1C 5.8     Takes occas for tylen for pain and discomfort  Breathing overall decent, rarely notes wheezing. Notes this breathing appears to be helping with the breathing.  Compliant blood pressure medicine. No obvious side effects. Numbers good when checked elsewhere.  Compliant with his lipid medication. Obvious side effects. Trying to watch his diet.  Recently had loose stools and in fact 7 trips to the bathroom last thursday. Patient understandably anxious about this with history of C. difficile and infections.  Review of Systems No headache no chest pain no back pain no abdominal pain no change bowel habits no blood in stool ROS otherwise negative    Objective:   Physical Exam Alert no apparent distress. H&T normal. Lungs clear. Heart regular in rhythm. Ankles without edema.       Assessment & Plan:  Pressure 1 type 2 diabetes good control and tight. #2 hypertension stable. #3 transient loose bowels now resolved. #4 COPD ongoing. #5 known coronary artery disease. Plan diet exercise discussed. Appropriate blood  work. Vaccines discussed. Diet discussed. WSL

## 2014-02-08 ENCOUNTER — Encounter (HOSPITAL_COMMUNITY)
Admission: RE | Admit: 2014-02-08 | Discharge: 2014-02-08 | Disposition: A | Discharge status: Home / Self Care | Payer: Medicare Other | Source: Ambulatory Visit | Attending: Pulmonary Disease | Admitting: Pulmonary Disease

## 2014-02-08 DIAGNOSIS — Z5189 Encounter for other specified aftercare: Secondary | ICD-10-CM | POA: Diagnosis not present

## 2014-02-13 ENCOUNTER — Encounter (HOSPITAL_COMMUNITY)
Admission: RE | Admit: 2014-02-13 | Discharge: 2014-02-13 | Disposition: A | Discharge status: Home / Self Care | Payer: Medicare Other | Source: Ambulatory Visit | Attending: Pulmonary Disease | Admitting: Pulmonary Disease

## 2014-02-13 DIAGNOSIS — Z5189 Encounter for other specified aftercare: Secondary | ICD-10-CM | POA: Diagnosis not present

## 2014-02-15 ENCOUNTER — Encounter (HOSPITAL_COMMUNITY): Payer: Medicare Other

## 2014-02-20 ENCOUNTER — Encounter (HOSPITAL_COMMUNITY)
Admission: RE | Admit: 2014-02-20 | Discharge: 2014-02-20 | Disposition: A | Discharge status: Home / Self Care | Payer: Medicare Other | Source: Ambulatory Visit | Attending: Pulmonary Disease | Admitting: Pulmonary Disease

## 2014-02-20 DIAGNOSIS — J438 Other emphysema: Secondary | ICD-10-CM | POA: Insufficient documentation

## 2014-02-20 DIAGNOSIS — Z5189 Encounter for other specified aftercare: Secondary | ICD-10-CM | POA: Diagnosis not present

## 2014-02-22 ENCOUNTER — Encounter (HOSPITAL_COMMUNITY)
Admission: RE | Admit: 2014-02-22 | Discharge: 2014-02-22 | Disposition: A | Discharge status: Home / Self Care | Payer: Medicare Other | Source: Ambulatory Visit | Attending: Pulmonary Disease | Admitting: Pulmonary Disease

## 2014-02-22 DIAGNOSIS — Z5189 Encounter for other specified aftercare: Secondary | ICD-10-CM | POA: Diagnosis not present

## 2014-02-25 ENCOUNTER — Other Ambulatory Visit: Payer: Self-pay | Admitting: Family Medicine

## 2014-02-27 ENCOUNTER — Encounter (HOSPITAL_COMMUNITY): Payer: Medicare Other

## 2014-02-28 ENCOUNTER — Ambulatory Visit: Payer: Medicare Other | Admitting: Pulmonary Disease

## 2014-03-01 ENCOUNTER — Encounter (HOSPITAL_COMMUNITY)
Admission: RE | Admit: 2014-03-01 | Discharge: 2014-03-01 | Disposition: A | Discharge status: Home / Self Care | Payer: Medicare Other | Source: Ambulatory Visit | Attending: Pulmonary Disease | Admitting: Pulmonary Disease

## 2014-03-01 DIAGNOSIS — Z5189 Encounter for other specified aftercare: Secondary | ICD-10-CM | POA: Diagnosis not present

## 2014-03-06 ENCOUNTER — Encounter (HOSPITAL_COMMUNITY)
Admission: RE | Admit: 2014-03-06 | Discharge: 2014-03-06 | Disposition: A | Discharge status: Home / Self Care | Payer: Medicare Other | Source: Ambulatory Visit | Attending: Pulmonary Disease | Admitting: Pulmonary Disease

## 2014-03-06 DIAGNOSIS — Z5189 Encounter for other specified aftercare: Secondary | ICD-10-CM | POA: Diagnosis not present

## 2014-03-08 ENCOUNTER — Encounter (HOSPITAL_COMMUNITY): Payer: Medicare Other

## 2014-03-13 ENCOUNTER — Encounter (HOSPITAL_COMMUNITY): Payer: Medicare Other

## 2014-03-15 ENCOUNTER — Encounter (HOSPITAL_COMMUNITY)
Admission: RE | Admit: 2014-03-15 | Discharge: 2014-03-15 | Disposition: A | Discharge status: Home / Self Care | Payer: Medicare Other | Source: Ambulatory Visit | Attending: Pulmonary Disease | Admitting: Pulmonary Disease

## 2014-03-15 DIAGNOSIS — Z5189 Encounter for other specified aftercare: Secondary | ICD-10-CM | POA: Diagnosis not present

## 2014-03-20 ENCOUNTER — Ambulatory Visit (INDEPENDENT_AMBULATORY_CARE_PROVIDER_SITE_OTHER): Payer: Medicare Other | Admitting: Pulmonary Disease

## 2014-03-20 ENCOUNTER — Encounter (HOSPITAL_COMMUNITY)
Admission: RE | Admit: 2014-03-20 | Discharge: 2014-03-20 | Disposition: A | Discharge status: Home / Self Care | Payer: Medicare Other | Source: Ambulatory Visit | Attending: Pulmonary Disease | Admitting: Pulmonary Disease

## 2014-03-20 ENCOUNTER — Encounter: Payer: Self-pay | Admitting: Pulmonary Disease

## 2014-03-20 VITALS — BP 120/70 | HR 58 | Temp 98.2°F | Ht 72.0 in | Wt 277.6 lb

## 2014-03-20 DIAGNOSIS — J438 Other emphysema: Secondary | ICD-10-CM

## 2014-03-20 DIAGNOSIS — Z5189 Encounter for other specified aftercare: Secondary | ICD-10-CM | POA: Diagnosis not present

## 2014-03-20 DIAGNOSIS — J439 Emphysema, unspecified: Secondary | ICD-10-CM

## 2014-03-20 NOTE — Assessment & Plan Note (Signed)
The patient appears to be stable on Spiriva alone, but continues to have significant dyspnea on exertion. He is clearly morbidly obese and deconditioned, but he is on a low level bronchodilator regimen at this time. I would like to try him on stiolto for the next 4 weeks to see if he sees a difference. He currently is not having any chest congestion or purulence, and his increased mucus may simply be from postnasal drip. I have asked him to keep an eye on this, and let us know if worsening. Finally, I have encouraged him to work aggressively on weight loss, and getting some type of exercise.

## 2014-03-20 NOTE — Progress Notes (Signed)
   Subjective:    Patient ID: Kyle [name], male    DOB: 1934/01/27, 80 y.o.   MRN: [id number]  HPI The patient comes in today for followup of his known COPD. He feels that his breathing is similar to the last visit, but he has had a little more cough with mucus over the last week. He does not feel congested in his chest, nor are the secretions purulent. It primarily occurs at night upon lying down and first thing in the morning. He does have some postnasal drip for which he takes Zyrtec each morning.   Review of Systems  Constitutional: Negative for fever and unexpected weight change.  HENT: Negative for congestion, dental problem, ear pain, nosebleeds, postnasal drip, rhinorrhea, sinus pressure, sneezing, sore throat and trouble swallowing.   Eyes: Negative for redness and itching.  Respiratory: Positive for cough. Negative for chest tightness, shortness of breath and wheezing.   Cardiovascular: Negative for palpitations and leg swelling.  Gastrointestinal: Negative for nausea and vomiting.  Genitourinary: Negative for dysuria.  Musculoskeletal: Negative for joint swelling.  Skin: Negative for rash.  Neurological: Negative for headaches.  Hematological: Does not bruise/bleed easily.  Psychiatric/Behavioral: Negative for dysphoric mood. The patient is not nervous/anxious.        Objective:   Physical Exam Obese male in no acute distress Nose without purulence or discharge noted Neck without lymphadenopathy or thyromegaly Chest with decreased breath sounds throughout, no wheezing Cardiac exam with regular rate and rhythm Lower extremities with mild edema, no cyanosis Alert and oriented, moves all 4 extremities.       Assessment & Plan:

## 2014-03-20 NOTE — Patient Instructions (Signed)
Will give you a trial of stiolto, 2 inhalations each am everyday.  Take this in the place of spiriva for 4 weeks.  You can then decide if it helps you more than spiriva, and if it is worth the costs.  Let me know. Work on weight loss and some type of conditioning program.   followup with me again in 29mos.

## 2014-03-22 ENCOUNTER — Encounter (HOSPITAL_COMMUNITY): Payer: Medicare Other

## 2014-03-27 ENCOUNTER — Encounter (HOSPITAL_COMMUNITY): Payer: Medicare Other

## 2014-03-27 ENCOUNTER — Other Ambulatory Visit: Payer: Self-pay | Admitting: Family Medicine

## 2014-03-29 ENCOUNTER — Encounter (HOSPITAL_COMMUNITY)
Admission: RE | Admit: 2014-03-29 | Discharge: 2014-03-29 | Disposition: A | Discharge status: Home / Self Care | Payer: Medicare Other | Source: Ambulatory Visit | Attending: Pulmonary Disease | Admitting: Pulmonary Disease

## 2014-03-29 DIAGNOSIS — J438 Other emphysema: Secondary | ICD-10-CM | POA: Diagnosis not present

## 2014-03-29 DIAGNOSIS — Z5189 Encounter for other specified aftercare: Secondary | ICD-10-CM | POA: Diagnosis present

## 2014-04-03 ENCOUNTER — Encounter (HOSPITAL_COMMUNITY)
Admission: RE | Admit: 2014-04-03 | Discharge: 2014-04-03 | Disposition: A | Discharge status: Home / Self Care | Payer: Medicare Other | Source: Ambulatory Visit | Attending: Pulmonary Disease | Admitting: Pulmonary Disease

## 2014-04-03 DIAGNOSIS — Z5189 Encounter for other specified aftercare: Secondary | ICD-10-CM | POA: Diagnosis not present

## 2014-04-05 ENCOUNTER — Encounter (HOSPITAL_COMMUNITY)
Admission: RE | Admit: 2014-04-05 | Discharge: 2014-04-05 | Disposition: A | Discharge status: Home / Self Care | Payer: Medicare Other | Source: Ambulatory Visit | Attending: Pulmonary Disease | Admitting: Pulmonary Disease

## 2014-04-05 DIAGNOSIS — Z5189 Encounter for other specified aftercare: Secondary | ICD-10-CM | POA: Diagnosis not present

## 2014-04-10 ENCOUNTER — Encounter (HOSPITAL_COMMUNITY)
Admission: RE | Admit: 2014-04-10 | Discharge: 2014-04-10 | Disposition: A | Discharge status: Home / Self Care | Payer: Medicare Other | Source: Ambulatory Visit | Attending: Pulmonary Disease | Admitting: Pulmonary Disease

## 2014-04-10 DIAGNOSIS — Z5189 Encounter for other specified aftercare: Secondary | ICD-10-CM | POA: Diagnosis not present

## 2014-04-12 ENCOUNTER — Encounter (HOSPITAL_COMMUNITY)
Admission: RE | Admit: 2014-04-12 | Discharge: 2014-04-12 | Disposition: A | Discharge status: Home / Self Care | Payer: Medicare Other | Source: Ambulatory Visit | Attending: Pulmonary Disease | Admitting: Pulmonary Disease

## 2014-04-12 DIAGNOSIS — Z5189 Encounter for other specified aftercare: Secondary | ICD-10-CM | POA: Diagnosis not present

## 2014-04-17 ENCOUNTER — Encounter (HOSPITAL_COMMUNITY)
Admission: RE | Admit: 2014-04-17 | Discharge: 2014-04-17 | Disposition: A | Discharge status: Home / Self Care | Payer: Medicare Other | Source: Ambulatory Visit | Attending: Pulmonary Disease | Admitting: Pulmonary Disease

## 2014-04-17 DIAGNOSIS — Z5189 Encounter for other specified aftercare: Secondary | ICD-10-CM | POA: Diagnosis not present

## 2014-04-19 ENCOUNTER — Encounter (HOSPITAL_COMMUNITY)
Admission: RE | Admit: 2014-04-19 | Discharge: 2014-04-19 | Disposition: A | Discharge status: Home / Self Care | Payer: Medicare Other | Source: Ambulatory Visit | Attending: Pulmonary Disease | Admitting: Pulmonary Disease

## 2014-04-19 DIAGNOSIS — Z5189 Encounter for other specified aftercare: Secondary | ICD-10-CM | POA: Diagnosis not present

## 2014-04-20 HISTORY — PX: CYSTOSCOPY: SUR368

## 2014-04-24 ENCOUNTER — Encounter (HOSPITAL_COMMUNITY)
Admission: RE | Admit: 2014-04-24 | Discharge: 2014-04-24 | Disposition: A | Discharge status: Home / Self Care | Payer: Medicare Other | Source: Ambulatory Visit | Attending: Pulmonary Disease | Admitting: Pulmonary Disease

## 2014-04-24 DIAGNOSIS — Z5189 Encounter for other specified aftercare: Secondary | ICD-10-CM | POA: Insufficient documentation

## 2014-04-24 DIAGNOSIS — J439 Emphysema, unspecified: Secondary | ICD-10-CM | POA: Diagnosis not present

## 2014-04-26 ENCOUNTER — Encounter (HOSPITAL_COMMUNITY)
Admission: RE | Admit: 2014-04-26 | Discharge: 2014-04-26 | Disposition: A | Discharge status: Home / Self Care | Payer: Medicare Other | Source: Ambulatory Visit | Attending: Pulmonary Disease | Admitting: Pulmonary Disease

## 2014-04-26 DIAGNOSIS — Z5189 Encounter for other specified aftercare: Secondary | ICD-10-CM | POA: Diagnosis not present

## 2014-05-01 ENCOUNTER — Encounter (HOSPITAL_COMMUNITY)
Admission: RE | Admit: 2014-05-01 | Discharge: 2014-05-01 | Disposition: A | Discharge status: Home / Self Care | Payer: Medicare Other | Source: Ambulatory Visit | Attending: Pulmonary Disease | Admitting: Pulmonary Disease

## 2014-05-01 DIAGNOSIS — Z5189 Encounter for other specified aftercare: Secondary | ICD-10-CM | POA: Diagnosis not present

## 2014-05-01 NOTE — Progress Notes (Signed)
Pulmonary Rehabilitation Program Outcomes Report   Orientation:  01/10/14 1st Week Note; 02/03/14 Graduate Date:  TBD Discharge Date:  TBD # of sessions completed: 3  Pulmonologist: Clance Family MD:  Wolfgang Phoenix Class Time:  1pm  A.  Exercise Program:  Tolerates exercise @ 3.76 METS for 15 minutes and Walk Test Results:  Pre: 826ft at 1.80mph with 2.2 mets  B.  Mental Health:  Good mental attitude  C.  Education/Instruction/Skills  Attended 1 education classes  Demonstrates accurate pursed lip breathing  D.  Nutrition/Weight Control/Body Composition:  Adherence to prescribed nutrition program: fair    E.  Blood Lipids    Lab Results  Component Value Date   CHOL 149 11/03/2013   HDL 31* 11/03/2013   LDLCALC 82 11/03/2013   TRIG 180* 11/03/2013   CHOLHDL 4.8 11/03/2013    F.  Lifestyle Changes:  Making positive lifestyle changes and Not smoking:  Quit 12/19/2012  G.  Symptoms noted with exercise:  Asymptomatic  Report Completed By:  Russella Dar, Manager   Comments:  This is patient 1st week report.

## 2014-05-02 NOTE — Progress Notes (Signed)
Pulmonary Rehabilitation Program Outcomes Report   Orientation:  01/10/14 Graduate Date:  TBD  Discharge Date:  TBD # of sessions completed: 12  Pulmonologist: Dr. Gwenette Greet Family MD:  Dr. Baltazar Apo Class Time:  2:30pm  A.  Exercise Program:  Tolerates exercise @ 3.92 METS for 15 minutes  B.  Mental Health:  Good mental attitude  C.  Education/Instruction/Skills  Accurately checks own pulse.  Rest:  72  Exercise:  84, Knows THR for exercise, Uses Perceived Exertion Scale and/or Dyspnea Scale and Attended 7 education classes  Demonstrates accurate pursed lip breathing  D.  Nutrition/Weight Control/Body Composition:  Adherence to prescribed nutrition program: fair    E.  Blood Lipids    Lab Results  Component Value Date   CHOL 149 11/03/2013   HDL 31* 11/03/2013   LDLCALC 82 11/03/2013   TRIG 180* 11/03/2013   CHOLHDL 4.8 11/03/2013    F.  Lifestyle Changes:  Making positive lifestyle changes  G.  Symptoms noted with exercise:  Asymptomatic  Report Completed By:  Russella Dar, Manager    Comments:  This is his halfway progress note.

## 2014-05-02 NOTE — Progress Notes (Signed)
Pulmonary Rehabilitation Program Outcomes Report   Orientation:  01/10/14 Graduate Date:  05/01/14 Discharge Date:  05/01/14 # of sessions completed: 24 session  Pulmonologist: Clance Family MD:  Wolfgang Phoenix Class Time:  2:30  A.  Exercise Program:  Improved functional capacity  .8 % and Improved  muscular strength  .93 %  B.  Mental Health:  Good mental attitude  C.  Education/Instruction/Skills  Accurately checks own pulse.  Rest:  57  Exercise:  110, Knows THR for exercise, Uses Perceived Exertion Scale and/or Dyspnea Scale and Attended 13 education classes  Anticipated home compliance with respiratory program:  good  D.  Nutrition/Weight Control/Body Composition:  Adherence to prescribed nutrition program: fair    E.  Blood Lipids    Lab Results  Component Value Date   CHOL 149 11/03/2013   HDL 31* 11/03/2013   LDLCALC 82 11/03/2013   TRIG 180* 11/03/2013   CHOLHDL 4.8 11/03/2013    F.  Lifestyle Changes:  Making positive lifestyle changes  G.  Symptoms noted with exercise:  Asymptomatic  Report Completed By:  Russella Dar, Manager   Comments:  Patient graduated on 05/01/14 to home exercise program.

## 2014-05-03 ENCOUNTER — Encounter (HOSPITAL_COMMUNITY): Payer: Medicare Other

## 2014-05-06 ENCOUNTER — Other Ambulatory Visit: Payer: Self-pay | Admitting: Family Medicine

## 2014-05-08 ENCOUNTER — Encounter (HOSPITAL_COMMUNITY): Payer: Medicare Other

## 2014-05-10 ENCOUNTER — Encounter (HOSPITAL_COMMUNITY): Payer: Medicare Other

## 2014-05-15 ENCOUNTER — Encounter (HOSPITAL_COMMUNITY): Payer: Medicare Other

## 2014-05-15 ENCOUNTER — Encounter: Payer: Self-pay | Admitting: Family Medicine

## 2014-05-15 ENCOUNTER — Ambulatory Visit (INDEPENDENT_AMBULATORY_CARE_PROVIDER_SITE_OTHER): Payer: Medicare Other | Admitting: Family Medicine

## 2014-05-15 VITALS — BP 130/80 | Ht 72.0 in | Wt 278.0 lb

## 2014-05-15 DIAGNOSIS — I10 Essential (primary) hypertension: Secondary | ICD-10-CM

## 2014-05-15 DIAGNOSIS — Z23 Encounter for immunization: Secondary | ICD-10-CM

## 2014-05-15 DIAGNOSIS — Z79899 Other long term (current) drug therapy: Secondary | ICD-10-CM

## 2014-05-15 DIAGNOSIS — E119 Type 2 diabetes mellitus without complications: Secondary | ICD-10-CM

## 2014-05-15 LAB — LIPID PANEL
CHOL/HDL RATIO: 4.5 ratio
CHOLESTEROL: 140 mg/dL (ref 0–200)
HDL: 31 mg/dL — ABNORMAL LOW (ref >39)
LDL Cholesterol: 69 mg/dL (ref 0–99)
TRIGLYCERIDES: 202 mg/dL — AB (ref <150)
VLDL: 40 mg/dL (ref 0–40)

## 2014-05-15 LAB — HEPATIC FUNCTION PANEL
ALT: 11 U/L (ref 0–53)
AST: 11 U/L (ref 0–37)
Albumin: 4.3 g/dL (ref 3.5–5.2)
Alkaline Phosphatase: 65 U/L (ref 39–117)
BILIRUBIN INDIRECT: 1 mg/dL (ref 0.2–1.2)
BILIRUBIN TOTAL: 1.2 mg/dL (ref 0.2–1.2)
Bilirubin, Direct: 0.2 mg/dL (ref 0.0–0.3)
TOTAL PROTEIN: 7 g/dL (ref 6.0–8.3)

## 2014-05-15 LAB — POCT GLYCOSYLATED HEMOGLOBIN (HGB A1C): Hemoglobin A1C: 5.7

## 2014-05-15 MED ORDER — LISINOPRIL 5 MG PO TABS: 5.0000 mg | ORAL_TABLET | Freq: Every day | ORAL | Status: DC

## 2014-05-15 MED ORDER — METOPROLOL TARTRATE 50 MG PO TABS: 50.0000 mg | ORAL_TABLET | Freq: Two times a day (BID) | ORAL | Status: DC

## 2014-05-15 NOTE — Progress Notes (Signed)
   Subjective:    Patient ID: Kyle [name], male    DOB: 09-12-1933, 80 y.o.   MRN: [id number]  Diabetes He presents for his follow-up diabetic visit. He has type 2 diabetes mellitus. (Fatigue) There are no diabetic associated symptoms. Current diabetic treatment includes oral agent (monotherapy). He is compliant with treatment all of the time. He monitors blood glucose at home 1-2 x per day. Blood glucose monitoring compliance is good. His breakfast blood glucose range is generally 110-130 mg/dl. His lunch blood glucose range is generally <70 mg/dl. He does not see a podiatrist.Eye exam is not current.    Sugars mostly 110 to 130  One aft dropped to 67 and felt bad  Results for orders placed in visit on 05/15/14  POCT GLYCOSYLATED HEMOGLOBIN (HGB A1C)      Result Value Ref Range   Hemoglobin A1C 5.7     Patient compliant with COPD medications. No major shortness of breath.  Compliant with lipid medications. Watching fat intake. Does not miss a dose. No obvious side effects.  Reports progressive low back pain and knee pain. History of known arthritis. Wonders if he should take Advil or Tylenol.  Review of Systems No headache no chest pain no abdominal pain no change in bowel habits no blood in stool ROS otherwise negative    Objective:   Physical Exam  Alert no acute distress blood pressure improved on repeat HEENT normal. Lungs diminished breath sounds heart regular rate and rhythm ankles without edema      Assessment & Plan:  Impression 1 hypertension good control #2 hyperlipidemia status uncertain #3 type 2 diabetes controlled to tight discuss plan stop Glucotrol. Appropriate blood work. Flu vaccine. Exercise encouraged. Follow-up in several months as scheduled. Tylenol instead of Advil if at all possible due to heart wrist with anti-inflammatories. W SL

## 2014-05-17 ENCOUNTER — Encounter (HOSPITAL_COMMUNITY): Payer: Medicare Other

## 2014-05-19 NOTE — Progress Notes (Signed)
Patient graduated from Pulmonary Rehabilitation today on 05/01/14 after completing 24 sessions. He achieved LTG of 30 minutes of aerobic exercise at Max Met level of 3.92. All patients vitals are WNL. Patient met with dietician. Discharge instruction has been reviewed in detail and patient stated an understanding of material given. Patient plans to exercise at home by walking. Cardiac Rehab staff will make f/u calls at 1 month, 6 months, and 1 year. Patient had no complaints of any abnormal S/S or pain on their exit visit. Patient was able to complete 6 minute walk test w/o any assistance of pushing a wheelchair. See walk test for results.

## 2014-05-25 ENCOUNTER — Encounter: Payer: Self-pay | Admitting: Family Medicine

## 2014-06-22 ENCOUNTER — Other Ambulatory Visit (HOSPITAL_COMMUNITY): Payer: Self-pay

## 2014-06-22 DIAGNOSIS — C3491 Malignant neoplasm of unspecified part of right bronchus or lung: Secondary | ICD-10-CM

## 2014-06-22 DIAGNOSIS — C679 Malignant neoplasm of bladder, unspecified: Secondary | ICD-10-CM

## 2014-06-27 ENCOUNTER — Other Ambulatory Visit (HOSPITAL_COMMUNITY): Payer: Medicare Other

## 2014-06-27 ENCOUNTER — Ambulatory Visit (HOSPITAL_COMMUNITY): Payer: Medicare Other

## 2014-06-28 ENCOUNTER — Encounter (HOSPITAL_BASED_OUTPATIENT_CLINIC_OR_DEPARTMENT_OTHER): Payer: Medicare Other

## 2014-06-28 ENCOUNTER — Encounter (HOSPITAL_COMMUNITY): Payer: Medicare Other | Attending: Pulmonary Disease

## 2014-06-28 ENCOUNTER — Encounter (HOSPITAL_COMMUNITY): Payer: Self-pay

## 2014-06-28 VITALS — BP 126/69 | HR 66 | Temp 97.8°F | Resp 18 | Wt 276.5 lb

## 2014-06-28 DIAGNOSIS — D696 Thrombocytopenia, unspecified: Secondary | ICD-10-CM

## 2014-06-28 DIAGNOSIS — C3490 Malignant neoplasm of unspecified part of unspecified bronchus or lung: Secondary | ICD-10-CM

## 2014-06-28 DIAGNOSIS — J438 Other emphysema: Secondary | ICD-10-CM

## 2014-06-28 DIAGNOSIS — Z5189 Encounter for other specified aftercare: Secondary | ICD-10-CM | POA: Diagnosis present

## 2014-06-28 DIAGNOSIS — Z8551 Personal history of malignant neoplasm of bladder: Secondary | ICD-10-CM

## 2014-06-28 DIAGNOSIS — C3491 Malignant neoplasm of unspecified part of right bronchus or lung: Secondary | ICD-10-CM

## 2014-06-28 DIAGNOSIS — N183 Chronic kidney disease, stage 3 (moderate): Secondary | ICD-10-CM

## 2014-06-28 DIAGNOSIS — Z85828 Personal history of other malignant neoplasm of skin: Secondary | ICD-10-CM

## 2014-06-28 DIAGNOSIS — C679 Malignant neoplasm of bladder, unspecified: Secondary | ICD-10-CM

## 2014-06-28 DIAGNOSIS — J439 Emphysema, unspecified: Secondary | ICD-10-CM | POA: Diagnosis not present

## 2014-06-28 LAB — CBC WITH DIFFERENTIAL/PLATELET
Basophils Absolute: 0 10*3/uL (ref 0.0–0.1)
Basophils Relative: 0 % (ref 0–1)
Eosinophils Absolute: 0.2 10*3/uL (ref 0.0–0.7)
Eosinophils Relative: 3 % (ref 0–5)
HEMATOCRIT: 45.9 % (ref 39.0–52.0)
Hemoglobin: 15.1 g/dL (ref 13.0–17.0)
Lymphocytes Relative: 31 % (ref 12–46)
Lymphs Abs: 2.3 10*3/uL (ref 0.7–4.0)
MCH: 31.7 pg (ref 26.0–34.0)
MCHC: 32.9 g/dL (ref 30.0–36.0)
MCV: 96.4 fL (ref 78.0–100.0)
MONOS PCT: 8 % (ref 3–12)
Monocytes Absolute: 0.6 10*3/uL (ref 0.1–1.0)
NEUTROS ABS: 4.2 10*3/uL (ref 1.7–7.7)
NEUTROS PCT: 57 % (ref 43–77)
Platelets: 95 10*3/uL — ABNORMAL LOW (ref 150–400)
RBC: 4.76 MIL/uL (ref 4.22–5.81)
RDW: 16.3 % — ABNORMAL HIGH (ref 11.5–15.5)
WBC: 7.4 10*3/uL (ref 4.0–10.5)

## 2014-06-28 LAB — COMPREHENSIVE METABOLIC PANEL
ALK PHOS: 72 U/L (ref 39–117)
ALT: 12 U/L (ref 0–53)
ANION GAP: 12 (ref 5–15)
AST: 11 U/L (ref 0–37)
Albumin: 3.6 g/dL (ref 3.5–5.2)
BILIRUBIN TOTAL: 1 mg/dL (ref 0.3–1.2)
BUN: 29 mg/dL — AB (ref 6–23)
CHLORIDE: 105 meq/L (ref 96–112)
CO2: 24 mEq/L (ref 19–32)
Calcium: 9.2 mg/dL (ref 8.4–10.5)
Creatinine, Ser: 1.94 mg/dL — ABNORMAL HIGH (ref 0.50–1.35)
GFR calc non Af Amer: 31 mL/min — ABNORMAL LOW (ref >90)
GFR, EST AFRICAN AMERICAN: 36 mL/min — AB (ref >90)
GLUCOSE: 209 mg/dL — AB (ref 70–99)
POTASSIUM: 4.6 meq/L (ref 3.7–5.3)
Sodium: 141 mEq/L (ref 137–147)
Total Protein: 6.9 g/dL (ref 6.0–8.3)

## 2014-06-28 NOTE — Progress Notes (Signed)
LABS FOR [facility] CMP

## 2014-06-28 NOTE — Progress Notes (Signed)
[facility]  OFFICE PROGRESS NOTE  [name], MD [place] [address]  DIAGNOSIS: Adenocarcinoma of right lung - Plan: CT Chest W Contrast  Malignant neoplasm of urinary bladder, unspecified site  Other emphysema  Chief Complaint  Patient presents with  . Stage IA lung cancer    CURRENT THERAPY: Surveillance per NCCN guidelines.  INTERVAL HISTORY: Kyle [name] 80 y.o. male returns forfollowup of stage I-A (T1 B. N0 M0) moderately differentiated adenocarcinoma lung, 2.3 cm in size, status post surgery 04/03/2011 with negative margins, no LV I. and no pleural involvement, 6 lymph nodes all negative here for followup after repeat CT scan of the chest on 12/13/2013 in the setting of chronic immune thrombocytopenia. There is also history of bladder cancer followed by Dr. Rosana Hoes. He had undergone bronchoscopy in March of 2015 because of abnormalities found on CT scan which turned out to be atelectasis secondary to inspissated mucus material with no evidence of malignancy. He denies any cough or wheezing but does get short of breath with wheezing when he changes environmental temperature. He denies any fever, night sweats, sore throat, hematuria, incontinence, dysuria, nocturia, lower extremity swelling or redness, chest pain, PND, orthopnea, skin rash, headache, or seizures.    MEDICAL HISTORY: Past Medical History  Diagnosis Date  . Arteriosclerotic cardiovascular disease (ASCVD) 1973, 12/2010    S/P NSTEMI secondary to distal RCA/PL lesion, tx medically.  EF of  55%-60% per  echo.  . Diabetes mellitus     Type II  . Thrombocytopenia   . COPD (chronic obstructive pulmonary disease)   . AAA (abdominal aortic aneurysm) 2004    s/p repair 2004; 4.3 cm infrarenal in 05/2011  . Tobacco abuse     50-pack-year consumption; quit in 12/2010  . Hyperlipidemia   . OSA (obstructive sleep apnea)   . Hypertension   . Benign  prostatic hypertrophy     s/p transurethral resection of the prostate  . Obesity   . Tubular adenoma of colon   . Cataract   . Ventral hernia   . Diverticulosis   . Bilateral renal masses     Cystic, more prominent on CT in 12/2010 than 2007; followed by Dr. Rosana Hoes  . ITP (idiopathic thrombocytopenic purpura) 09/07/2012    Chronic ITP of adults versus medication-induced ITP.  Stable  . Insomnia   . Bladder cancer 1996    Transurethral resection of the bladder + chemotherapy/BCG as premed  . Adenocarcinoma, lung 01/2011    transthoracic FNA; resection of the superior segment of the RLL in 03/2011; negative nodes; no chemotherapy nor radiation planned  . Chronic kidney disease     Creatinine 1.4 on discharge 12/20/2100; proteinuria; normal renal ultrasound in 2010; recent creatinines of 1.7-2.; Bilateral cystic renal masses by CT in 2011  . Nephrolithiasis 2012    ARF in 01/2011 due to obstructing nephrolithiasis  . Arm fracture     right arm  . Essential and other specified forms of tremor 02/23/2013  . Diplopia 02/23/2013  . Polyneuropathy in diabetes(357.2) 02/23/2013  . Abnormality of gait 02/23/2013  . Hx of Clostridium difficile infection   . Adenocarcinoma of right lung 02/24/2011    Ct A/P 2012:  2cm lung mass RLL PET 1540:  Hypermetabolic RLL mass, no other hypermetabolic areas. TTNA 02/2011:  Adenocarcinoma, markers c/w lung origin Right lower lobe superior segmentectomy. 04/01/2011 Dr. Arlyce Dice   . Coronary artery  disease   . Myocardial infarction   . Cough     thick phlegm    INTERIM HISTORY: has Arteriosclerotic cardiovascular disease (ASCVD); Hypertension; CKD (chronic kidney disease) stage 3, GFR 30-59 ml/min; COPD (chronic obstructive pulmonary disease); Adenocarcinoma of right lung; Obstructive sleep apnea; Fasting hyperglycemia; Nephrolithiasis; Bladder carcinoma; Diarrhea; Tubular adenoma of colon; Ventral hernia; AAA (abdominal aortic aneurysm); Tobacco abuse; LLQ pain; ITP (idiopathic  thrombocytopenic purpura); Diabetes; Fracture of right distal radius; COPD exacerbation; Essential and other specified forms of tremor; Diplopia; Polyneuropathy in diabetes(357.2); and Abnormality of gait on his problem list.    ALLERGIES:  is allergic to codeine and etodolac.  MEDICATIONS: has a current medication list which includes the following prescription(s): accu-chek aviva plus, acetaminophen, albuterol, alprazolam, aspirin ec, cetirizine, citalopram, ferrous sulfate, fluticasone, lisinopril, metoprolol, niacin, nitroglycerin, pravastatin, spiriva handihaler, and vitamin d (ergocalciferol).  SURGICAL HISTORY:  Past Surgical History  Procedure Laterality Date  . Transurethral resection of prostate    . Cystectomy    . Tonsillectomy    . Colonoscopy  03/19/2010    Dr. Gala Romney -(poor prep) Anal papilla, rectal hyperplastic polyp, tubular adenoma removed splenic flexure, left-sided diverticula  . Wedge resection  04/2011    carcinoma of lung  . Colonoscopy  11/28/2004    RMR:  Diminutive rectal and left colon polyps as described above, cold  biopsied/removed/  Left sided diverticula. The remainder of the colonic mucosa appeared normal.  . Colonoscopy   09/15/01    RMR: Multiple diminutive polyps destroyed with dermolysis as described above/ Multiple small polyps on stalks in the colon resected with snare cautery/ Scattered pan colonic diverticulum/ The remainder of the colonic mucosa appeared normal  . Abdominal aortic aneurysm repair  2004  . Cardiac catheterization    . Lung lobectomy    . Video bronchoscopy with endobronchial navigation N/A 10/10/2013    Procedure: VIDEO BRONCHOSCOPY WITH ENDOBRONCHIAL NAVIGATION;  Surgeon: Melrose Nakayama, MD;  Location: [facility];  Service: Thoracic;  Laterality: N/A;  NO BLOOD THINNERS BUT PATIENT HAS ITP  . Cystoscopy  04/2014    FAMILY HISTORY: family history includes Aortic aneurysm in his father and mother; Arthritis in his father; Clotting  disorder in his father; Diabetes in his father; Emphysema in his father; Hypertension in his father; Other in his paternal grandfather; Stroke in his paternal grandmother; Tremor in his father.  SOCIAL HISTORY:  reports that he quit smoking about 18 months ago. His smoking use included Cigarettes. He has a 50 pack-year smoking history. He has never used smokeless tobacco. He reports that he does not drink alcohol or use illicit drugs.  REVIEW OF SYSTEMS:  Other than that discussed above is noncontributory.  PHYSICAL EXAMINATION: ECOG PERFORMANCE STATUS: 1 - Symptomatic but completely ambulatory  Blood pressure 126/69, pulse 66, temperature 97.8 F (36.6 C), temperature source Oral, resp. rate 18, weight 276 lb 8 oz (125.42 kg), SpO2 94 %.  GENERAL:alert, no distress and comfortable. Morbid obesity. SKIN: skin color, texture, turgor are normal, no rashes or significant lesions EYES: PERLA; Conjunctiva are pink and non-injected, sclera clear SINUSES: No redness or tenderness over maxillary or ethmoid sinuses OROPHARYNX:no exudate, no erythema on lips, buccal mucosa, or tongue. NECK: supple, thyroid normal size, non-tender, without nodularity. No masses CHEST: Increased AP diameter with no gynecomastia. LYMPH:  no palpable lymphadenopathy in the cervical, axillary or inguinal LUNGS: clear to auscultation and percussion with normal breathing effort HEART: regular rate & rhythm and no murmurs. ABDOMEN:abdomen soft, non-tender and  normal bowel sounds. Large ventral hernia easily reducible with no hepatomegaly, ascites, or CVA tenderness. MUSCULOSKELETAL:no cyanosis of digits and no clubbing. Range of motion normal. +1 bilateral lower extremity edema. NEURO: alert & oriented x 3 with fluent speech, no focal motor/sensory deficits   LABORATORY DATA: Lab on 06/28/2014  Component Date Value Ref Range Status  . WBC 06/28/2014 7.4  4.0 - 10.5 K/uL Final  . RBC 06/28/2014 4.76  4.22 - 5.81 MIL/uL  Final  . Hemoglobin 06/28/2014 15.1  13.0 - 17.0 g/dL Final  . HCT 06/28/2014 45.9  39.0 - 52.0 % Final  . MCV 06/28/2014 96.4  78.0 - 100.0 fL Final  . MCH 06/28/2014 31.7  26.0 - 34.0 pg Final  . MCHC 06/28/2014 32.9  30.0 - 36.0 g/dL Final  . RDW 06/28/2014 16.3* 11.5 - 15.5 % Final  . Platelets 06/28/2014 95* 150 - 400 K/uL Final   Comment: SPECIMEN CHECKED FOR CLOTS CONSISTENT WITH PREVIOUS RESULT   . Neutrophils Relative % 06/28/2014 57  43 - 77 % Final  . Neutro Abs 06/28/2014 4.2  1.7 - 7.7 K/uL Final  . Lymphocytes Relative 06/28/2014 31  12 - 46 % Final  . Lymphs Abs 06/28/2014 2.3  0.7 - 4.0 K/uL Final  . Monocytes Relative 06/28/2014 8  3 - 12 % Final  . Monocytes Absolute 06/28/2014 0.6  0.1 - 1.0 K/uL Final  . Eosinophils Relative 06/28/2014 3  0 - 5 % Final  . Eosinophils Absolute 06/28/2014 0.2  0.0 - 0.7 K/uL Final  . Basophils Relative 06/28/2014 0  0 - 1 % Final  . Basophils Absolute 06/28/2014 0.0  0.0 - 0.1 K/uL Final  . Sodium 06/28/2014 141  137 - 147 mEq/L Final  . Potassium 06/28/2014 4.6  3.7 - 5.3 mEq/L Final  . Chloride 06/28/2014 105  96 - 112 mEq/L Final  . CO2 06/28/2014 24  19 - 32 mEq/L Final  . Glucose, Bld 06/28/2014 209* 70 - 99 mg/dL Final  . BUN 06/28/2014 29* 6 - 23 mg/dL Final  . Creatinine, Ser 06/28/2014 1.94* 0.50 - 1.35 mg/dL Final  . Calcium 06/28/2014 9.2  8.4 - 10.5 mg/dL Final  . Total Protein 06/28/2014 6.9  6.0 - 8.3 g/dL Final  . Albumin 06/28/2014 3.6  3.5 - 5.2 g/dL Final  . AST 06/28/2014 11  0 - 37 U/L Final  . ALT 06/28/2014 12  0 - 53 U/L Final  . Alkaline Phosphatase 06/28/2014 72  39 - 117 U/L Final  . Total Bilirubin 06/28/2014 1.0  0.3 - 1.2 mg/dL Final  . GFR calc non Af Amer 06/28/2014 31* >90 mL/min Final  . GFR calc Af Amer 06/28/2014 36* >90 mL/min Final   Comment: (NOTE) The eGFR has been calculated using the CKD EPI equation. This calculation has not been validated in all clinical situations. eGFR's  persistently <90 mL/min signify possible Chronic Kidney Disease.   . Anion gap 06/28/2014 12  5 - 15 Final    PATHOLOGY: Adenocarcinoma, no driver mutations.  Urinalysis    Component Value Date/Time   COLORURINE YELLOW 10/06/2013 1527   APPEARANCEUR CLOUDY* 10/06/2013 1527   LABSPEC 1.018 10/06/2013 1527   PHURINE 5.5 10/06/2013 1527   GLUCOSEU NEGATIVE 10/06/2013 1527   HGBUR LARGE* 10/06/2013 1527   BILIRUBINUR NEGATIVE 10/06/2013 1527   KETONESUR NEGATIVE 10/06/2013 1527   PROTEINUR 100* 10/06/2013 1527   UROBILINOGEN 1.0 10/06/2013 1527   NITRITE NEGATIVE 10/06/2013 1527   LEUKOCYTESUR MODERATE*  10/06/2013 1527    RADIOGRAPHIC STUDIES: No results found.  ASSESSMENT:  1. Stage I a (T1 B., N0, M0) moderately differentiated adenocarcinoma the lung, 2.3 cm in size with surgery on 04/03/2011. Margins were clear, no LV I was seen, and there is no pleural involvement. 6 lymph nodes were all negative getting stage IA disease.  2. Thrombocytopenia thus far consistent with chronic ITP of adults versus drug-induced mild thrombocytopenia. He is on too many medications to withdraw them completely. There is no evidence on his May 2013 CT scan of splenomegaly or cirrhosis. Stable.  3. Obesity  4. Intermittent diarrhea treated by GI  5. Bladder cancer followed by Dr. Rosana Hoes, for cystoscopy and retrograde on 07/07/2014 most recent cystoscopy done 1 month ago showed no evidence of disease according to the patient.  6. MI x2 the last in May 2012  7. Abdominal hernia secondary to abdominal aortic aneurysm surgery in 2008  8. Double vision in the past which has not been a recent issue  9. History of skin cancer  10. Chronic renal disease, grade 3, followed by nephrologist  11. Elevated glucose, followed by Dr. Wolfgang Phoenix.  12. S/P bronchoscopy by Dr. Roxan Hockey on 10/10/2013 with biopsies all negative for malignancy.   PLAN:  #1. Follow-up in 6 months with CBC, chem profile, and  repeat CT scan in June 2016.   All questions were answered. The patient knows to call the clinic with any problems, questions or concerns. We can certainly see the patient much sooner if necessary.   I spent 25 minutes counseling the patient face to face. The total time spent in the appointment was 30 minutes.    Doroteo Bradford, MD 06/28/2014 2:24 PM  DISCLAIMER:  This note was dictated with voice recognition software.  Similar sounding words can inadvertently be transcribed inaccurately and may not be corrected upon review.

## 2014-06-28 NOTE — Patient Instructions (Signed)
[facility] Discharge Instructions  RECOMMENDATIONS MADE BY THE CONSULTANT AND ANY TEST RESULTS WILL BE SENT TO YOUR REFERRING PHYSICIAN.  EXAM FINDINGS BY THE PHYSICIAN TODAY AND SIGNS OR SYMPTOMS TO REPORT TO CLINIC OR PRIMARY PHYSICIAN: Exam and findings as discussed by Dr. Barnet Glasgow.  If any concerns with your labs we will contact you.     INSTRUCTIONS/FOLLOW-UP: Labs, CT scan of chest and office visit in 6 months.  Thank you for choosing [facility] to provide your oncology and hematology care.  To afford each patient quality time with our providers, please arrive at least 15 minutes before your scheduled appointment time.  With your help, our goal is to use those 15 minutes to complete the necessary work-up to ensure our physicians have the information they need to help with your evaluation and healthcare recommendations.    Effective January 1st, 2014, we ask that you re-schedule your appointment with our physicians should you arrive 10 or more minutes late for your appointment.  We strive to give you quality time with our providers, and arriving late affects you and other patients whose appointments are after yours.    Again, thank you for choosing [facility].  Our hope is that these requests will decrease the amount of time that you wait before being seen by our physicians.       _____________________________________________________________  Should you have questions after your visit to [facility], please contact our office at [phone] between the hours of 8:30 a.m. and 4:30 p.m.  Voicemails left after 4:30 p.m. will not be returned until the following business day.  For prescription refill requests, have your pharmacy contact our office with your prescription refill request.    _______________________________________________________________  We hope that we have given you very good care.  You may receive a patient  satisfaction survey in the mail, please complete it and return it as soon as possible.  We value your feedback!  _______________________________________________________________  Have you asked about our STAR program?  STAR stands for Survivorship Training and Rehabilitation, and this is a nationally recognized cancer care program that focuses on survivorship and rehabilitation.  Cancer and cancer treatments may cause problems, such as, pain, making you feel tired and keeping you from doing the things that you need or want to do. Cancer rehabilitation can help. Our goal is to reduce these troubling effects and help you have the best quality of life possible.  You may receive a survey from a nurse that asks questions about your current state of health.  Based on the survey results, all eligible patients will be referred to the [facility] program for an evaluation so we can better serve you!  A frequently asked questions sheet is available upon request.

## 2014-07-15 ENCOUNTER — Encounter (HOSPITAL_COMMUNITY): Payer: Self-pay | Admitting: Emergency Medicine

## 2014-07-15 ENCOUNTER — Emergency Department (HOSPITAL_COMMUNITY)
Admission: EM | Admit: 2014-07-15 | Discharge: 2014-07-15 | Disposition: A | Discharge status: Home / Self Care | Payer: Medicare Other | Source: Home / Self Care | Attending: Emergency Medicine | Admitting: Emergency Medicine

## 2014-07-15 DIAGNOSIS — B340 Adenovirus infection, unspecified: Secondary | ICD-10-CM

## 2014-07-15 LAB — POCT RAPID STREP A: Streptococcus, Group A Screen (Direct): NEGATIVE

## 2014-07-15 MED ORDER — BENZONATATE 200 MG PO CAPS: 200.0000 mg | ORAL_CAPSULE | Freq: Three times a day (TID) | ORAL | Status: DC | PRN

## 2014-07-15 MED ORDER — MOXIFLOXACIN HCL 0.5 % OP SOLN: 1.0000 [drp] | Freq: Three times a day (TID) | OPHTHALMIC | Status: DC

## 2014-07-15 MED ORDER — IPRATROPIUM BROMIDE 0.06 % NA SOLN: 2.0000 | Freq: Four times a day (QID) | NASAL | Status: DC

## 2014-07-15 NOTE — ED Notes (Signed)
Pt states that he has had eye drainage for 2 or more day with a sore throat. Pt states that when he wakes up his eye are crusted over.

## 2014-07-15 NOTE — ED Provider Notes (Signed)
Chief Complaint   Sore Throat and Conjunctivitis   History of Present Illness   Kyle [name] is a 80 year old male who has had a three-day history of redness and irritation of both eyes, tearing, but no yellow discharge or changes in his vision. He's also had a sore throat, nasal congestion with gray to white drainage, and cough productive gray to white sputum. He has not had any fever, chills, headache, shortness of breath, wheezing, or chest pain.  Review of Systems   Other than as noted above, the patient denies any of the following symptoms: Systemic:  No fevers, chills, sweats, or myalgias. Eye:  No redness or discharge. ENT:  No ear pain, headache, nasal congestion, drainage, sinus pressure, or sore throat. Neck:  No neck pain, stiffness, or swollen glands. Lungs:  No cough, sputum production, hemoptysis, wheezing, chest tightness, shortness of breath or chest pain. GI:  No abdominal pain, nausea, vomiting or diarrhea.  [place]   Past medical history, family history, social history, meds, and allergies were reviewed. He is allergic to codeine and etodolac. He has a history of diabetes, coronary artery disease, COPD, abdominal aortic aneurysm, hyperlipidemia, sleep apnea, hypertension, BPH, ITP, bladder cancer, adenocarcinoma the lung, chronic kidney disease, nephrolithiasis,and myocardial infarction and myocardial infarction. Current meds include albuterol, Xanax, aspirin, Zyrtec, Celexa, ferrous sulfate, Flonase, lisinopril, metoprolol, Niaspan, nitroglycerin, pravastatin, and Spiriva.  Physical exam   Vital signs:  BP 142/81 mmHg  Pulse 70  Temp(Src) 98.4 F (36.9 C) (Oral)  Resp 24  SpO2 93% General:  Alert and oriented.  In no distress.  Skin warm and dry. Eye:  Conjunctiva is were mildly injected, there was yellowish crusting. No exudate, corneas were intact, anterior chambers were normal, PERRLA, full EOMs. Lids were normal. ENT:  TMs and canals were normal, without  erythema or inflammation.  Nasal mucosa was clear and uncongested, without drainage.  Mucous membranes were moist.  Pharynx was clear with no exudate or drainage.  There were no oral ulcerations or lesions. Neck:  Supple, no adenopathy, tenderness or mass. Lungs:  No respiratory distress.  Lungs were clear to auscultation, without wheezes, rales or rhonchi.  Breath sounds were clear and equal bilaterally.  Heart:  Regular rhythm, without gallops, murmers or rubs. Skin:  Clear, warm, and dry, without rash or lesions.  Labs   Results for orders placed or performed during the hospital encounter of 07/15/14  POCT rapid strep A [facility])  Result Value Ref Range   Streptococcus, Group A Screen (Direct) POSITIVE (A) NEGATIVE  POCT rapid strep A [facility])  Result Value Ref Range   Streptococcus, Group A Screen (Direct) NEGATIVE NEGATIVE    Assessment     The encounter diagnosis was Adenoviral infection.  There is no evidence of pneumonia, strep throat, sinusitis, otitis media.    Plan    1.  Meds:  The following meds were prescribed:   Discharge Medication List as of 07/15/2014 10:17 AM    START taking these medications   Details  benzonatate (TESSALON) 200 MG capsule Take 1 capsule (200 mg total) by mouth 3 (three) times daily as needed for cough., Starting 07/15/2014, Until Discontinued, Normal    ipratropium (ATROVENT) 0.06 % nasal spray Place 2 sprays into both nostrils 4 (four) times daily., Starting 07/15/2014, Until Discontinued, Normal    moxifloxacin (VIGAMOX) 0.5 % ophthalmic solution Place 1 drop into both eyes 3 (three) times daily., Starting 07/15/2014, Until Discontinued, Normal  2.  Patient Education/Counseling:  The patient was given appropriate handouts, self care instructions, and instructed in symptomatic relief.  Instructed to get extra fluids and extra rest.    3.  Follow up:  The patient was told to follow up here if no better in 3 to 4 days,  or sooner if becoming worse in any way, and given some red flag symptoms such as increasing fever, difficulty breathing, chest pain, or persistent vomiting which would prompt immediate return. He was given strict return precautions if he should develop any fever, difficulty breathing, chest pain, changes in his vision, or changes in the amount or character of his sputum, he's to return immediately for reevaluation.      Harden Mo, MD 07/15/14 [phone]

## 2014-07-15 NOTE — Discharge Instructions (Signed)
Adenovirus Adenoviruses are viruses that usually cause breathing problems. They may also cause other illnesses, such as stomach flu, bladder infection, and rashes. CAUSES  Adenoviruses are passed by direct contact. This can happen from touching the contaminated hands of someone who has just gone to the bathroom. It can also be passed through contaminated water.  You may have the virus and give it to others without being sick yourself.  Some types of this virus occur naturally in most parts of the world. Most of these infections occur in children.  Epidemics are often centered around swimming pools and small lakes. Symptoms can include fever and pink eye.  Adenovirus 7 is a specific virus gotten by breathing in the virus. It typically causes severe problems in the breathing system. Patients who get the adenovirus by the mouth usually have less severe symptoms. Adenovirus caught by breathing in the virus is more common in the late winter, spring, and early summer. SYMPTOMS  Symptoms vary and can include:   Common cold symptoms.  Pneumonia.  Croup.  Bronchitis. Patients with HIV, transplant patients, and some cancer patients are more likely to have severe problems. Acute respiratory disease (ARD) can be caused by adenovirus in crowded conditions.  These viruses are not easily killed with common cleaning products. DIAGNOSIS  Blood tests can be used to identify the problem.  TREATMENT  Most infections are mild and require no therapy. The symptoms can be treated to make the patient comfortable.  Document Released: 09/27/2002 Document Revised: 09/29/2011 Document Reviewed: 11/09/2013 [facility] Patient Information 2015 [place], [place]. This information is not intended to replace advice given to you by your health care provider. Make sure you discuss any questions you have with your health care provider.

## 2014-07-16 ENCOUNTER — Other Ambulatory Visit: Payer: Self-pay | Admitting: Family Medicine

## 2014-07-17 LAB — CULTURE, GROUP A STREP

## 2014-07-17 LAB — POCT RAPID STREP A: Streptococcus, Group A Screen (Direct): POSITIVE — AB

## 2014-07-18 ENCOUNTER — Other Ambulatory Visit: Payer: Self-pay | Admitting: Family Medicine

## 2014-07-21 LAB — HM DIABETES EYE EXAM

## 2014-07-24 DIAGNOSIS — G4733 Obstructive sleep apnea (adult) (pediatric): Secondary | ICD-10-CM | POA: Diagnosis not present

## 2014-07-25 DIAGNOSIS — H2513 Age-related nuclear cataract, bilateral: Secondary | ICD-10-CM | POA: Diagnosis not present

## 2014-07-25 DIAGNOSIS — H538 Other visual disturbances: Secondary | ICD-10-CM | POA: Diagnosis not present

## 2014-07-25 LAB — HM DIABETES EYE EXAM

## 2014-07-28 DIAGNOSIS — F17210 Nicotine dependence, cigarettes, uncomplicated: Secondary | ICD-10-CM | POA: Diagnosis not present

## 2014-07-28 DIAGNOSIS — R319 Hematuria, unspecified: Secondary | ICD-10-CM | POA: Diagnosis not present

## 2014-08-10 ENCOUNTER — Other Ambulatory Visit: Payer: Self-pay | Admitting: Family Medicine

## 2014-08-14 DIAGNOSIS — C672 Malignant neoplasm of lateral wall of bladder: Secondary | ICD-10-CM | POA: Diagnosis not present

## 2014-08-14 DIAGNOSIS — D090 Carcinoma in situ of bladder: Secondary | ICD-10-CM | POA: Diagnosis not present

## 2014-08-16 ENCOUNTER — Encounter: Payer: Self-pay | Admitting: Family Medicine

## 2014-08-16 ENCOUNTER — Ambulatory Visit (INDEPENDENT_AMBULATORY_CARE_PROVIDER_SITE_OTHER): Payer: Medicare Other | Admitting: Family Medicine

## 2014-08-16 VITALS — BP 112/76 | Ht 72.0 in | Wt 274.0 lb

## 2014-08-16 DIAGNOSIS — E119 Type 2 diabetes mellitus without complications: Secondary | ICD-10-CM | POA: Diagnosis not present

## 2014-08-16 LAB — POCT GLYCOSYLATED HEMOGLOBIN (HGB A1C): Hemoglobin A1C: 6.5

## 2014-08-16 MED ORDER — GENTAMICIN SULFATE 0.3 % OP SOLN: 1.0000 [drp] | Freq: Three times a day (TID) | OPHTHALMIC | Status: AC

## 2014-08-16 NOTE — Progress Notes (Signed)
   Subjective:    Patient ID: Kyle [name], male    DOB: 1934-03-06, 81 y.o.   MRN: [id number]  HPI Comments: Pt said he went to urgent care on 12/26 for adenoviral infection. Pt says his left eye is still draining along with a productive cough.   Diabetes He presents for his follow-up diabetic visit. He has type 2 diabetes mellitus. Hypoglycemia symptoms include sweats and tremors. There are no diabetic associated symptoms. Current diabetic treatment includes diet. Frequency home blood tests: once a month. His overall blood glucose range is 110-130 mg/dl. He does not see a podiatrist.Eye exam is current.    Felt the sugar was low a couple of time  Took spome extra food then  Eye crusty and irritated, with eye crusty  Still coughng some clearish phlegm  Used eye drops but now gone  Compliant with blood pressure medication. Does not miss a dose. Watching salt intake. Blood pressures good when checked elsewhere.  Compliant with his Spiriva inhaler. States definitely hoping his breathing. Has not had a lot of wheezing. Unfortunately not exercising much.  Review of Systems  Neurological: Positive for tremors.   No chest pain no headache no abdominal pain some back discomfort some ankle pain ROS otherwise negative    Objective:   Physical Exam  Alert HEENT normal. Blood pressure good on repeat. Lungs diminished breath sounds diffusely heart rare rhythm eye somewhat crusty and irritated.      Assessment & Plan:  Impression 1 type 2 diabetes decent control discussed #2 COPD ongoing. #3 residual conjunctivitis. #4 mild cough hold off on antibiotics rationale discussed #5 hypertension good control. Plan diet exercise discussed. Maintain same medications. Recheck in several months. WSL

## 2014-08-19 DIAGNOSIS — J449 Chronic obstructive pulmonary disease, unspecified: Secondary | ICD-10-CM | POA: Diagnosis not present

## 2014-08-24 ENCOUNTER — Encounter: Payer: Self-pay | Admitting: Nurse Practitioner

## 2014-08-24 ENCOUNTER — Ambulatory Visit (INDEPENDENT_AMBULATORY_CARE_PROVIDER_SITE_OTHER): Payer: Medicare Other | Admitting: Nurse Practitioner

## 2014-08-24 VITALS — BP 138/90 | HR 66 | Ht 72.0 in | Wt 275.2 lb

## 2014-08-24 DIAGNOSIS — E1142 Type 2 diabetes mellitus with diabetic polyneuropathy: Secondary | ICD-10-CM | POA: Diagnosis not present

## 2014-08-24 DIAGNOSIS — G250 Essential tremor: Secondary | ICD-10-CM

## 2014-08-24 DIAGNOSIS — R269 Unspecified abnormalities of gait and mobility: Secondary | ICD-10-CM | POA: Diagnosis not present

## 2014-08-24 DIAGNOSIS — H532 Diplopia: Secondary | ICD-10-CM

## 2014-08-24 DIAGNOSIS — R251 Tremor, unspecified: Secondary | ICD-10-CM | POA: Diagnosis not present

## 2014-08-24 DIAGNOSIS — G252 Other specified forms of tremor: Secondary | ICD-10-CM

## 2014-08-24 NOTE — Progress Notes (Signed)
I have read the note, and I agree with the clinical assessment and plan.  Kyle [name],Kyle [name]   

## 2014-08-24 NOTE — Patient Instructions (Signed)
Tremor is stable Memory score 30/30 will follow over time F/U yearly

## 2014-08-24 NOTE — Progress Notes (Signed)
[facility]  PATIENT: Kyle [name] DOB: 1934-03-14   REASON FOR VISIT: Follow-up for tremor and memory loss HISTORY FROM: Patient    HISTORY OF PRESENT ILLNESS:[name], 81 year old male returns for followup. He was last seen in this office 08/26/2013. He has a history of essential tremor for about 3-1/2 years and the tremor is not present at rest only when he is trying to use the arms. It varies from day-to-day, he has not had difficulty performing his activities of daily living. He was recently diagnosed with bladder cancer and is receiving chemotherapy. He has a chronic gait disorder and occasionally falls, he also has a 5 to 6 year history of diplopia. He was  evaluated by Dr. Sanda Klein at [facility] and prism glasses were recommended however the patient complains it didn't help the double vision. Labs for acetylcholine receptor, angiotensin converting enzyme, methylmalonic acid and vitamin B12 returned normal. MRI of the brain with diffuse atrophy. He claims his memory is about the same. He continues to drive without getting lost. He also has history of stage III renal disease, adenocarcinoma of the right lung, bladder cancer, MI 2 the last in 2012 and a large abdominal hernia secondary to AAA repair. He returns for reevaluation    HISTORY: of obesity, diabetes, hypertension, COPD, and tremor. The patient has a history of tremor that dates back at least 2 years. The patient indicates that the tremors are affecting both upper extremities, and the tremor is present when using the arms, not at rest. The patient indicates that there may be variability from day-to-day with the tremor, and the tremor tends to be worse when he is tired or sick. The patient has troubles with feeding himself, and difficulty with handwriting. The patient denies any tremor affecting the head or neck or any vocal tremor. The patient indicates that he was recently in the hospital with a  pulmonary infection, and the tremor significantly worsened at that time. The patient is on metoprolol, and he takes alprazolam at times for sleep. The patient indicates that his father may have had a tremor as he got older. The patient indicates that he has a chronic gait disorder, and he is now using a walker for ambulation. The patient has fallen on occasion. The patient reports some numbness in the feet, with occasional burning and stinging in his feet at night. The patient will have occasional bowel incontinence, and some urinary frequency. The patient is sent to this office for an evaluation. The patient also goes on to say that he has had double vision in a horizontal plane for 4 or 5 years, and this issue has not been evaluated. The double vision does impair his ability to drive to some degree, and he cannot read a newspaper well. The patient denies headaches, or changes in speech. The patient does have some slight swallowing issues. REVIEW OF SYSTEMS: Full 14 system review of systems performed and notable only for those listed, all others are neg:  Constitutional: Fatigue Cardiovascular: neg Ear/Nose/Throat: neg  Skin: neg Eyes: Redness, currently being treated for conjunctivitis Respiratory: Chronic cough Gastroitestinal: neg  Hematology/Lymphatic: Easy bruising  Endocrine: neg Musculoskeletal:neg Allergy/Immunology: neg Neurological: Memory loss, tremors Psychiatric: neg Sleep : Obstructive sleep apnea with CPAP   ALLERGIES: Allergies  Allergen Reactions  . Codeine Anaphylaxis  . Etodolac     dizziness    HOME MEDICATIONS: Outpatient Prescriptions Prior to Visit  Medication Sig Dispense Refill  . ACCU-CHEK AVIVA PLUS test  strip USE AS DIRECTED TO TEST ONCE DAILY. 50 each 6  . acetaminophen (TYLENOL) 500 MG tablet Take 500 mg by mouth every 6 (six) hours as needed for mild pain.     Marland Kitchen albuterol (PROAIR HFA) 108 (90 BASE) MCG/ACT inhaler Inhale 2 puffs into the lungs every 6  (six) hours as needed for wheezing. 1 Inhaler 1  . ALPRAZolam (XANAX) 0.5 MG tablet Take 0.5 mg by mouth 2 (two) times daily as needed for anxiety.    Marland Kitchen aspirin EC 81 MG tablet Take 81 mg by mouth daily.     . cetirizine (ZYRTEC) 10 MG tablet Take 10 mg by mouth daily.     . citalopram (CELEXA) 20 MG tablet TAKE ONE TABLET BY MOUTH ONCE DAILY 90 tablet 0  . ferrous sulfate (FERROUSUL) 325 (65 FE) MG tablet Take 325 mg by mouth daily with breakfast.      . fluticasone (FLONASE) 50 MCG/ACT nasal spray Place 2 sprays into both nostrils daily as needed for allergies.     Marland Kitchen gentamicin (GARAMYCIN) 0.3 % ophthalmic solution Place 1 drop into the right eye 3 (three) times daily. 5 mL 0  . ipratropium (ATROVENT) 0.06 % nasal spray Place 2 sprays into both nostrils 4 (four) times daily. 15 mL 12  . lisinopril (PRINIVIL,ZESTRIL) 5 MG tablet Take 1 tablet (5 mg total) by mouth daily. 90 tablet 1  . metoprolol (LOPRESSOR) 50 MG tablet Take 1 tablet (50 mg total) by mouth 2 (two) times daily. 180 tablet 1  . niacin (NIASPAN) 500 MG CR tablet Take 1,000 mg by mouth at bedtime.    . nitroGLYCERIN (NITROSTAT) 0.4 MG SL tablet Place 1 tablet (0.4 mg total) under the tongue as needed. Place 0.4 mg under the tongue every 5 (five) minutes as needed. Call MD if need more than 2 3 tablet 5  . pravastatin (PRAVACHOL) 80 MG tablet TAKE ONE TABLET BY MOUTH ONCE DAILY 90 tablet 1  . SPIRIVA HANDIHALER 18 MCG inhalation capsule INHALE ONE DOSE BY MOUTH ONCE DAILY 90 capsule 0  . Vitamin D, Ergocalciferol, (DRISDOL) 50000 UNITS CAPS capsule TAKE ONE CAPSULE BY MOUTH ONCE EVERY MONTH 3 capsule 0   No facility-administered medications prior to visit.    PAST MEDICAL HISTORY: Past Medical History  Diagnosis Date  . Arteriosclerotic cardiovascular disease (ASCVD) 1973, 12/2010    S/P NSTEMI secondary to distal RCA/PL lesion, tx medically.  EF of  55%-60% per  echo.  . Diabetes mellitus     Type II  . Thrombocytopenia   .  COPD (chronic obstructive pulmonary disease)   . AAA (abdominal aortic aneurysm) 2004    s/p repair 2004; 4.3 cm infrarenal in 05/2011  . Tobacco abuse     50-pack-year consumption; quit in 12/2010  . Hyperlipidemia   . OSA (obstructive sleep apnea)   . Hypertension   . Benign prostatic hypertrophy     s/p transurethral resection of the prostate  . Obesity   . Tubular adenoma of colon   . Cataract   . Ventral hernia   . Diverticulosis   . Bilateral renal masses     Cystic, more prominent on CT in 12/2010 than 2007; followed by Dr. Rosana Hoes  . ITP (idiopathic thrombocytopenic purpura) 09/07/2012    Chronic ITP of adults versus medication-induced ITP.  Stable  . Insomnia   . Bladder cancer 1996    Transurethral resection of the bladder + chemotherapy/BCG as premed  . Adenocarcinoma, lung 01/2011  transthoracic FNA; resection of the superior segment of the RLL in 03/2011; negative nodes; no chemotherapy nor radiation planned  . Chronic kidney disease     Creatinine 1.4 on discharge 12/20/2100; proteinuria; normal renal ultrasound in 2010; recent creatinines of 1.7-2.; Bilateral cystic renal masses by CT in 2011  . Nephrolithiasis 2012    ARF in 01/2011 due to obstructing nephrolithiasis  . Arm fracture     right arm  . Essential and other specified forms of tremor 02/23/2013  . Diplopia 02/23/2013  . Polyneuropathy in diabetes(357.2) 02/23/2013  . Abnormality of gait 02/23/2013  . Hx of Clostridium difficile infection   . Adenocarcinoma of right lung 02/24/2011    Ct A/P 2012:  2cm lung mass RLL PET 2426:  Hypermetabolic RLL mass, no other hypermetabolic areas. TTNA 02/2011:  Adenocarcinoma, markers c/w lung origin Right lower lobe superior segmentectomy. 04/01/2011 Dr. Arlyce Dice   . Coronary artery disease   . Myocardial infarction   . Cough     thick phlegm    PAST SURGICAL HISTORY: Past Surgical History  Procedure Laterality Date  . Transurethral resection of prostate    . Cystectomy    .  Tonsillectomy    . Colonoscopy  03/19/2010    Dr. Gala Romney -(poor prep) Anal papilla, rectal hyperplastic polyp, tubular adenoma removed splenic flexure, left-sided diverticula  . Wedge resection  04/2011    carcinoma of lung  . Colonoscopy  11/28/2004    RMR:  Diminutive rectal and left colon polyps as described above, cold  biopsied/removed/  Left sided diverticula. The remainder of the colonic mucosa appeared normal.  . Colonoscopy   09/15/01    RMR: Multiple diminutive polyps destroyed with dermolysis as described above/ Multiple small polyps on stalks in the colon resected with snare cautery/ Scattered pan colonic diverticulum/ The remainder of the colonic mucosa appeared normal  . Abdominal aortic aneurysm repair  2004  . Cardiac catheterization    . Lung lobectomy    . Video bronchoscopy with endobronchial navigation N/A 10/10/2013    Procedure: VIDEO BRONCHOSCOPY WITH ENDOBRONCHIAL NAVIGATION;  Surgeon: Melrose Nakayama, MD;  Location: [place];  Service: Thoracic;  Laterality: N/A;  NO BLOOD THINNERS BUT PATIENT HAS ITP  . Cystoscopy  04/2014    FAMILY HISTORY: Family History  Problem Relation Age of Onset  . Aortic aneurysm Mother   . Emphysema Father     smoker  . Clotting disorder Father   . Arthritis Father   . Hypertension Father   . Diabetes Father   . Aortic aneurysm Father   . Tremor Father   . Stroke Paternal Grandmother   . Other Paternal Grandfather     brain aneurysm    SOCIAL HISTORY: History   Social History  . Marital Status: Married    Spouse Name: [name]     Number of Children: 2  . Years of Education: 12+   Occupational History  . Retired  [employer]   Social History Main Topics  . Smoking status: Former Smoker -- 1.00 packs/day for 50 years    Types: Cigarettes    Quit date: 12/19/2012  . Smokeless tobacco: Never Used  . Alcohol Use: No  . Drug Use: No  . Sexual Activity: No   Other Topics Concern  . Not on file   Social History  Narrative   Patient lives at home with his wife [name].    Patient has 2 children.    Patient is retired.  Patient is right handed.    Patient has 2 years of college.      PHYSICAL EXAM  Filed Vitals:   08/24/14 0924  BP: 138/90  Pulse: 66  Height: 6' (1.829 m)  Weight: 275 lb 3.2 oz (124.83 kg)   Body mass index is 37.32 kg/(m^2).  Generalized: Well developed, obese male in no acute distress  Head: normocephalic and atraumatic,. Oropharynx benign  Neck: Supple, no carotid bruits  Cardiac: Regular rate rhythm, no murmur  Musculoskeletal: No deformity   Neurological examination   Mentation: Alert oriented to time, place, history taking. Attention span and concentration appropriate. Recent and remote memory intact.  Follows all commands speech and language fluent. MMSE 30/30. AFT 12  Cranial nerve II-XII:  Visual acuity 20/50 both eyes.Pupils were equal round reactive to light extraocular movements were full, visual field were full on confrontational test. Mild ptosis on the left. Facial sensation and strength were normal. hearing was intact to finger rubbing bilaterally. Uvula tongue midline. head turning and shoulder shrug were normal and symmetric.Tongue protrusion into cheek strength was normal. Motor: normal bulk and tone, full strength in the BUE, BLE, fine finger movements normal, no pronator drift. No focal weakness Sensory: Stocking pattern diminished pinprick sensory deficit halfway up the legs bilaterally and reduction of position  sense on the right foot vibratory sensation is decreased in both feet  Coordination: finger-nose-finger, heel-to-shin bilaterally, no dysmetria, mild intention tremor, no resting tremor Reflexes: Depressed upper and lower, plantars downgoing Gait and Station: Rising up from seated position without assistance, wide based stance,  moderate stride, good arm swing, smooth turning, able to perform tiptoe, and heel walking without difficulty. Tandem  gait is unsteady. No assistive device  DIAGNOSTIC DATA (LABS, IMAGING, TESTING) - I reviewed patient records, labs, notes, testing and imaging myself where available.  Lab Results  Component Value Date   WBC 7.4 06/28/2014   HGB 15.1 06/28/2014   HCT 45.9 06/28/2014   MCV 96.4 06/28/2014   PLT 95* 06/28/2014      Component Value Date/Time   NA 141 06/28/2014 1234   K 4.6 06/28/2014 1234   CL 105 06/28/2014 1234   CO2 24 06/28/2014 1234   GLUCOSE 209* 06/28/2014 1234   BUN 29* 06/28/2014 1234   CREATININE 1.94* 06/28/2014 1234   CREATININE 1.88* 11/13/2011 1049   CALCIUM 9.2 06/28/2014 1234   PROT 6.9 06/28/2014 1234   ALBUMIN 3.6 06/28/2014 1234   AST 11 06/28/2014 1234   ALT 12 06/28/2014 1234   ALKPHOS 72 06/28/2014 1234   BILITOT 1.0 06/28/2014 1234   GFRNONAA 31* 06/28/2014 1234   GFRAA 36* 06/28/2014 1234   Lab Results  Component Value Date   CHOL 140 05/15/2014   HDL 31* 05/15/2014   LDLCALC 69 05/15/2014   TRIG 202* 05/15/2014   CHOLHDL 4.5 05/15/2014   Lab Results  Component Value Date   HGBA1C 6.5 08/16/2014   ASSESSMENT AND PLAN  81 y.o. year old male  has a past medical history of Arteriosclerotic cardiovascular disease (ASCVD) (1973, 12/2010); Diabetes mellitus; Thrombocytopenia; COPD (chronic obstructive pulmonary disease); AAA (abdominal aortic aneurysm) (2004);  Hyperlipidemia; OSA (obstructive sleep apnea); Hypertension; Benign prostatic hypertrophy; Obesity; Tubular adenoma of colon; Cataract; Ventral hernia; Diverticulosis; Bilateral renal masses; ITP (idiopathic thrombocytopenic purpura) (09/07/2012); Insomnia; Bladder cancer (1996); Adenocarcinoma, lung (01/2011); Chronic kidney disease; Nephrolithiasis (2012); Arm fracture; Essential and other specified forms of tremor (02/23/2013); Diplopia (02/23/2013); Polyneuropathy in diabetes(357.2) (02/23/2013); Abnormality of gait (02/23/2013);  Adenocarcinoma  of right lung (02/24/2011); Coronary artery disease;  Myocardial infarction; and Cough. here to follow-up for his tremor and memory loss.  Tremor is stable, xanax prn Memory score 30/30 will follow over time F/U yearly Dennie Bible, [facility], [facility], [facility] [address], [place], [place] [address] [phone]

## 2014-08-28 DIAGNOSIS — C679 Malignant neoplasm of bladder, unspecified: Secondary | ICD-10-CM | POA: Diagnosis not present

## 2014-08-31 DIAGNOSIS — N183 Chronic kidney disease, stage 3 (moderate): Secondary | ICD-10-CM | POA: Diagnosis not present

## 2014-08-31 DIAGNOSIS — I129 Hypertensive chronic kidney disease with stage 1 through stage 4 chronic kidney disease, or unspecified chronic kidney disease: Secondary | ICD-10-CM | POA: Diagnosis not present

## 2014-08-31 DIAGNOSIS — Z87448 Personal history of other diseases of urinary system: Secondary | ICD-10-CM | POA: Diagnosis not present

## 2014-08-31 DIAGNOSIS — J449 Chronic obstructive pulmonary disease, unspecified: Secondary | ICD-10-CM | POA: Diagnosis not present

## 2014-08-31 DIAGNOSIS — I252 Old myocardial infarction: Secondary | ICD-10-CM | POA: Diagnosis not present

## 2014-08-31 DIAGNOSIS — E559 Vitamin D deficiency, unspecified: Secondary | ICD-10-CM | POA: Diagnosis not present

## 2014-08-31 DIAGNOSIS — E785 Hyperlipidemia, unspecified: Secondary | ICD-10-CM | POA: Diagnosis not present

## 2014-08-31 DIAGNOSIS — Z8551 Personal history of malignant neoplasm of bladder: Secondary | ICD-10-CM | POA: Diagnosis not present

## 2014-08-31 DIAGNOSIS — G4733 Obstructive sleep apnea (adult) (pediatric): Secondary | ICD-10-CM | POA: Diagnosis not present

## 2014-08-31 DIAGNOSIS — I714 Abdominal aortic aneurysm, without rupture: Secondary | ICD-10-CM | POA: Diagnosis not present

## 2014-08-31 DIAGNOSIS — R609 Edema, unspecified: Secondary | ICD-10-CM | POA: Diagnosis not present

## 2014-08-31 DIAGNOSIS — I2510 Atherosclerotic heart disease of native coronary artery without angina pectoris: Secondary | ICD-10-CM | POA: Diagnosis not present

## 2014-08-31 DIAGNOSIS — N281 Cyst of kidney, acquired: Secondary | ICD-10-CM | POA: Diagnosis not present

## 2014-08-31 DIAGNOSIS — N200 Calculus of kidney: Secondary | ICD-10-CM | POA: Diagnosis not present

## 2014-09-01 ENCOUNTER — Other Ambulatory Visit: Payer: Self-pay | Admitting: Family Medicine

## 2014-09-04 DIAGNOSIS — C679 Malignant neoplasm of bladder, unspecified: Secondary | ICD-10-CM | POA: Diagnosis not present

## 2014-09-07 ENCOUNTER — Encounter: Payer: Self-pay | Admitting: Cardiovascular Disease

## 2014-09-07 ENCOUNTER — Ambulatory Visit (INDEPENDENT_AMBULATORY_CARE_PROVIDER_SITE_OTHER): Payer: Medicare Other | Admitting: Cardiovascular Disease

## 2014-09-07 VITALS — BP 130/70 | HR 59 | Ht 72.0 in | Wt 274.8 lb

## 2014-09-07 DIAGNOSIS — Z9889 Other specified postprocedural states: Secondary | ICD-10-CM

## 2014-09-07 DIAGNOSIS — I10 Essential (primary) hypertension: Secondary | ICD-10-CM | POA: Diagnosis not present

## 2014-09-07 DIAGNOSIS — I2510 Atherosclerotic heart disease of native coronary artery without angina pectoris: Secondary | ICD-10-CM | POA: Diagnosis not present

## 2014-09-07 DIAGNOSIS — E785 Hyperlipidemia, unspecified: Secondary | ICD-10-CM | POA: Diagnosis not present

## 2014-09-07 DIAGNOSIS — Z8679 Personal history of other diseases of the circulatory system: Secondary | ICD-10-CM

## 2014-09-07 NOTE — Progress Notes (Signed)
Patient ID: Kyle [name], male   DOB: 1934/05/02, 81 y.o.   MRN: [id number]      SUBJECTIVE: The patient is a 81 year old male who I am meeting for the first time. He has a history of coronary artery disease which has been medically managed since a non-STEMI in June 2012. At that time, coronary angiography revealed a 99% stenosis in the distal RCA at the ostium of the second postero-lateral branch which was small and not amenable to percutaneous intervention. 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 There was a long diffuse 70-80% lesion around the proximal bend and was deemed to be a very tortuous artery. The LAD had an ostial 20% lesion in the mid to distal LAD was diffusely diseased with a 50% stenosis. In the proximal portion of the first diagonal there was a 70% stenosis. He also has a history of AAA s/p repair, CKD stage III, bladder cancer, essential hypertension, diabetes, essential tremor, and lung cancer. He has been doing well and denies chest pain, activity limiting exertional dyspnea, leg swelling, palpitations, dizziness, and syncope. He typically performs activities for 15 minutes and takes a 30 minute rest. This has been ongoing for 2 years but he says has not been particularly bothersome to him. ECG performed in the office today demonstrates sinus rhythm with incomplete right bundle branch block and left anterior fascicular block. Old septal infarct cannot entirely be ruled out.    Review of Systems: As per "subjective", otherwise negative.  Allergies  Allergen Reactions  . Codeine Anaphylaxis  . Etodolac     dizziness    Current Outpatient Prescriptions  Medication Sig Dispense Refill  . ACCU-CHEK AVIVA PLUS test strip USE AS DIRECTED TO TEST ONCE DAILY. 50 each 6  . acetaminophen (TYLENOL) 500 MG tablet Take 500 mg by mouth every 6 (six) hours as needed for mild pain.     Marland Kitchen albuterol  (PROAIR HFA) 108 (90 BASE) MCG/ACT inhaler Inhale 2 puffs into the lungs every 6 (six) hours as needed for wheezing. 1 Inhaler 1  . ALPRAZolam (XANAX) 0.5 MG tablet Take 0.5 mg by mouth 2 (two) times daily as needed for anxiety.    Marland Kitchen aspirin EC 81 MG tablet Take 81 mg by mouth daily.     . cetirizine (ZYRTEC) 10 MG tablet Take 10 mg by mouth daily.     . citalopram (CELEXA) 20 MG tablet TAKE ONE TABLET BY MOUTH ONCE DAILY 90 tablet 0  . ferrous sulfate (FERROUSUL) 325 (65 FE) MG tablet Take 325 mg by mouth daily with breakfast.      . fluticasone (FLONASE) 50 MCG/ACT nasal spray Place 2 sprays into both nostrils daily as needed for allergies.     Marland Kitchen ipratropium (ATROVENT) 0.06 % nasal spray Place 2 sprays into both nostrils 4 (four) times daily. 15 mL 12  . lisinopril (PRINIVIL,ZESTRIL) 5 MG tablet Take 1 tablet (5 mg total) by mouth daily. 90 tablet 1  . metoprolol (LOPRESSOR) 50 MG tablet Take 1 tablet (50 mg total) by mouth 2 (two) times daily. 180 tablet 1  . niacin (NIASPAN) 500 MG CR tablet Take 1,000 mg by mouth at bedtime.    . nitroGLYCERIN (NITROSTAT) 0.4 MG SL tablet Place 1 tablet (0.4 mg total) under the tongue as needed. Place 0.4 mg under the tongue every 5 (five) minutes as needed. Call MD if need more than 2  3 tablet 5  . pravastatin (PRAVACHOL) 80 MG tablet TAKE ONE TABLET BY MOUTH ONCE DAILY 90 tablet 1  . SPIRIVA HANDIHALER 18 MCG inhalation capsule INHALE ONE DOSE BY MOUTH ONCE DAILY 90 capsule 0  . Vitamin D, Ergocalciferol, (DRISDOL) 50000 UNITS CAPS capsule TAKE ONE CAPSULE BY MOUTH ONCE EVERY MONTH 3 capsule 0  . [DISCONTINUED] budesonide-formoterol (SYMBICORT) 160-4.5 MCG/ACT inhaler Inhale 2 puffs into the lungs 2 (two) times daily.       No current facility-administered medications for this visit.    Past Medical History  Diagnosis Date  . Arteriosclerotic cardiovascular disease (ASCVD) 1973, 12/2010    S/P NSTEMI secondary to distal RCA/PL lesion, tx medically.  EF  of  55%-60% per  echo.  . Diabetes mellitus     Type II  . Thrombocytopenia   . COPD (chronic obstructive pulmonary disease)   . AAA (abdominal aortic aneurysm) 2004    s/p repair 2004; 4.3 cm infrarenal in 05/2011  . Tobacco abuse     50-pack-year consumption; quit in 12/2010  . Hyperlipidemia   . OSA (obstructive sleep apnea)   . Hypertension   . Benign prostatic hypertrophy     s/p transurethral resection of the prostate  . Obesity   . Tubular adenoma of colon   . Cataract   . Ventral hernia   . Diverticulosis   . Bilateral renal masses     Cystic, more prominent on CT in 12/2010 than 2007; followed by Dr. Rosana Hoes  . ITP (idiopathic thrombocytopenic purpura) 09/07/2012    Chronic ITP of adults versus medication-induced ITP.  Stable  . Insomnia   . Bladder cancer 1996    Transurethral resection of the bladder + chemotherapy/BCG as premed  . Adenocarcinoma, lung 01/2011    transthoracic FNA; resection of the superior segment of the RLL in 03/2011; negative nodes; no chemotherapy nor radiation planned  . Chronic kidney disease     Creatinine 1.4 on discharge 12/20/2100; proteinuria; normal renal ultrasound in 2010; recent creatinines of 1.7-2.; Bilateral cystic renal masses by CT in 2011  . Nephrolithiasis 2012    ARF in 01/2011 due to obstructing nephrolithiasis  . Arm fracture     right arm  . Essential and other specified forms of tremor 02/23/2013  . Diplopia 02/23/2013  . Polyneuropathy in diabetes(357.2) 02/23/2013  . Abnormality of gait 02/23/2013  . Hx of Clostridium difficile infection   . Adenocarcinoma of right lung 02/24/2011    Ct A/P 2012:  2cm lung mass RLL PET 5621:  Hypermetabolic RLL mass, no other hypermetabolic areas. TTNA 02/2011:  Adenocarcinoma, markers c/w lung origin Right lower lobe superior segmentectomy. 04/01/2011 Dr. Arlyce Dice   . Coronary artery disease   . Myocardial infarction   . Cough     thick phlegm    Past Surgical History  Procedure Laterality Date  .  Transurethral resection of prostate    . Cystectomy    . Tonsillectomy    . Colonoscopy  03/19/2010    Dr. Gala Romney -(poor prep) Anal papilla, rectal hyperplastic polyp, tubular adenoma removed splenic flexure, left-sided diverticula  . Wedge resection  04/2011    carcinoma of lung  . Colonoscopy  11/28/2004    RMR:  Diminutive rectal and left colon polyps as described above, cold  biopsied/removed/  Left sided diverticula. The remainder of the colonic mucosa appeared normal.  . Colonoscopy   09/15/01    RMR: Multiple diminutive polyps destroyed with dermolysis as described above/ Multiple small polyps  on stalks in the colon resected with snare cautery/ Scattered pan colonic diverticulum/ The remainder of the colonic mucosa appeared normal  . Abdominal aortic aneurysm repair  2004  . Cardiac catheterization    . Lung lobectomy    . Video bronchoscopy with endobronchial navigation N/A 10/10/2013    Procedure: VIDEO BRONCHOSCOPY WITH ENDOBRONCHIAL NAVIGATION;  Surgeon: Melrose Nakayama, MD;  Location: [facility];  Service: Thoracic;  Laterality: N/A;  NO BLOOD THINNERS BUT PATIENT HAS ITP  . Cystoscopy  04/2014    History   Social History  . Marital Status: Married    Spouse Name: [name]   . Number of Children: 2  . Years of Education: 12+   Occupational History  . Retired  [employer]   Social History Main Topics  . Smoking status: Former Smoker -- 1.00 packs/day for 50 years    Types: Cigarettes    Start date: 07/22/1955    Quit date: 12/19/2012  . Smokeless tobacco: Never Used  . Alcohol Use: No  . Drug Use: No  . Sexual Activity: No   Other Topics Concern  . Not on file   Social History Narrative   Patient lives at home with his wife [name].    Patient has 2 children.    Patient is retired.    Patient is right handed.    Patient has 2 years of college.      Filed Vitals:   09/07/14 0825  BP: 130/70  Pulse: 59  Height: 6' (1.829 m)  Weight: 274 lb 12.8 oz (124.648  kg)  SpO2: 95%    PHYSICAL EXAM General: NAD HEENT: Normal. Neck: No JVD, no thyromegaly. Lungs: Diminished but clear, no wheezes or rales. CV: Nondisplaced PMI.  Distant heart tones. Regular rate and rhythm, normal S1/S2, no S3/S4, no murmur. No pretibial or periankle edema.  No carotid bruit.  Abdomen: Obese, +ventral hernia.  Neurologic: Alert and oriented x 3.  Psych: Normal affect. Skin: Normal. Musculoskeletal: Normal range of motion, no gross deformities. Extremities: No clubbing or cyanosis.   ECG: Most recent ECG reviewed above.  June 2012 cardiac catheterization report:  Left main had an ostial 20% lesion. It gave off a ramus and LAD. The LAD was a moderate-sized vessel, gave off single diagonal. The mid-to- distal LAD was diffusely diseased with about 50% stenosis. In the proximal portion of the first diagonal, there was 70% stenosis.  Right coronary artery was a very large dominant vessel. It was aneurysmal throughout most of the mid and distal section. There were serial 50% lesions around the mid to distal bend. There was mild plaquing in the PDA. The first posterolateral appeared to be totally occluded with some late filling from right to right collaterals. In the distal RCA ostium of the second posterolateral, there was a 99% stenosis.  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 We were able to get this with an AR-1. There was a long diffuse 70-80% lesion around the proximal bends. It is a fairly tortuous artery.  ASSESSMENT: 1. Three-vessel coronary artery disease as described above. 2. The culprit lesion appears to be the distal right coronary artery  and posterolateral system. 3. Moderate severe stenosis in the proximal portion of the anomalous  left circumflex.  PLAN/DISCUSSION: I reviewed the films with Dr. Lia Foyer. We both feel that the culprit lesion is the distal RCA/PL  system which is small and not amenable to percutaneous intervention. We  will treat that area medically. He does have a moderate to severe stenosis in the proximal portion of the left circumflex. I suspect this may be chronic. It does not appear high critical at this point. At this point, we will attempt to treat him medically. If that fails, we will consider possible percutaneous intervention on the left circumflex system.    ASSESSMENT AND PLAN: 1. CAD: Cath results from 12/2010 noted above. Stable ischemic heart disease. Continue ASA, metoprolol, and pravastatin. 2. Essential hypertension: Well controlled on current therapy which includes lisinopril 5 mg daily. No changes. 3. Hyperlipidemia: ON 05/15/14, TC 140, TG 202, HDL 31, LDL 69. Continue present dose of statin therapy. 4. AAA s/p repair: Stable.  Dispo: f/u 1 year.  Kate Sable, M.D., F.A.C.C.

## 2014-09-07 NOTE — Patient Instructions (Signed)
Your physician wants you to follow-up in: 1 year with dr Virgina Jock will receive a reminder letter in the mail two months in advance. If you don't receive a letter, please call our office to schedule the follow-up appointment.   Your physician recommends that you continue on your current medications as directed. Please refer to the Current Medication list given to you today.    Thank you for choosing [facility] !

## 2014-09-11 DIAGNOSIS — C679 Malignant neoplasm of bladder, unspecified: Secondary | ICD-10-CM | POA: Diagnosis not present

## 2014-09-13 ENCOUNTER — Encounter: Payer: Self-pay | Admitting: *Deleted

## 2014-09-18 ENCOUNTER — Ambulatory Visit (INDEPENDENT_AMBULATORY_CARE_PROVIDER_SITE_OTHER): Payer: Medicare Other | Admitting: Pulmonary Disease

## 2014-09-18 ENCOUNTER — Encounter: Payer: Self-pay | Admitting: Pulmonary Disease

## 2014-09-18 VITALS — BP 112/66 | HR 56 | Temp 97.8°F | Ht 72.0 in | Wt 274.4 lb

## 2014-09-18 DIAGNOSIS — J438 Other emphysema: Secondary | ICD-10-CM

## 2014-09-18 DIAGNOSIS — J449 Chronic obstructive pulmonary disease, unspecified: Secondary | ICD-10-CM | POA: Diagnosis not present

## 2014-09-18 NOTE — Patient Instructions (Signed)
No change in breathing medications Work on weight loss, and some type of conditioning program followup with me again in 31mos, but call if having worsening breathing issues.

## 2014-09-18 NOTE — Progress Notes (Signed)
   Subjective:    Patient ID: Kyle [name], male    DOB: 23-Aug-1933, 81 y.o.   MRN: [id number]  HPI The patient comes in today for follow-up of his known moderate COPD. He was tried on stiolto at the last visit, but did not see a big difference when compared to Spiriva alone. He is now back on Spiriva with as needed albuterol. He feels that his breathing is at his usual baseline, and has not had a recent acute exacerbation. He denies any significant cough or mucus production at this time.   Review of Systems  Constitutional: Negative for fever and unexpected weight change.  HENT: Negative for congestion, dental problem, ear pain, nosebleeds, postnasal drip, rhinorrhea, sinus pressure, sneezing, sore throat and trouble swallowing.   Eyes: Negative for redness and itching.  Respiratory: Positive for cough and shortness of breath. Negative for chest tightness and wheezing.   Cardiovascular: Positive for leg swelling. Negative for palpitations.  Gastrointestinal: Negative for nausea and vomiting.  Genitourinary: Negative for dysuria.  Musculoskeletal: Negative for joint swelling.  Skin: Negative for rash.  Neurological: Negative for headaches.  Hematological: Does not bruise/bleed easily.  Psychiatric/Behavioral: Negative for dysphoric mood. The patient is not nervous/anxious.        Objective:   Physical Exam Obese male in no acute distress Nose without purulence or discharge noted Neck without lymphadenopathy or thyromegaly Chest with minimally decreased breath sounds, no rhonchi, wheezes, or crackles. Cardiac exam with regular rate and rhythm Lower extremities with mild edema, no cyanosis Alert and oriented, moves all 4 extremities.       Assessment & Plan:

## 2014-09-18 NOTE — Assessment & Plan Note (Signed)
The patient appears to be stable from a COPD standpoint on Spiriva alone. He was tried on stiolto at the last visit, and did not see enough of a change to justify the cost. He has only moderate disease, and is not having frequent exacerbations, and therefore I will maintain him on Spiriva alone. I have also stressed to him the importance of aggressive weight loss and some type of conditioning program. He stopped going to the pulmonary rehabilitation program, and I have offered to refer him back if he is interested.

## 2014-09-19 DIAGNOSIS — C679 Malignant neoplasm of bladder, unspecified: Secondary | ICD-10-CM | POA: Diagnosis not present

## 2014-09-25 DIAGNOSIS — C679 Malignant neoplasm of bladder, unspecified: Secondary | ICD-10-CM | POA: Diagnosis not present

## 2014-10-02 DIAGNOSIS — C679 Malignant neoplasm of bladder, unspecified: Secondary | ICD-10-CM | POA: Diagnosis not present

## 2014-10-17 ENCOUNTER — Encounter: Payer: Self-pay | Admitting: *Deleted

## 2014-10-17 ENCOUNTER — Other Ambulatory Visit: Payer: Self-pay | Admitting: *Deleted

## 2014-10-18 DIAGNOSIS — J449 Chronic obstructive pulmonary disease, unspecified: Secondary | ICD-10-CM | POA: Diagnosis not present

## 2014-10-18 NOTE — Patient Outreach (Signed)
Kyle [name]) Care Management   10/18/2014  Kyle [name] 02-21-1934 [id number]  Kyle [name] is an 81 y.o. male  Subjective: "I'm glad to be done with the chemo but I'm still just so tired all the time."  Objective:   Review of Systems  Constitutional: Negative.   HENT: Negative.   Eyes: Negative.   Respiratory: Negative.   Cardiovascular: Negative.   Gastrointestinal: Negative.   Genitourinary: Negative.   Musculoskeletal: Positive for myalgias.  Skin: Negative.   Neurological: Negative.   Endo/Heme/Allergies: Negative.   Psychiatric/Behavioral: Negative.     Physical Exam  Constitutional: He is oriented to person, place, and time. He appears well-developed and well-nourished.  Cardiovascular: Normal rate, regular rhythm and normal heart sounds.   Respiratory: Effort normal and breath sounds normal.  GI: Soft. Bowel sounds are normal.  Musculoskeletal: Normal range of motion.  Neurological: He is alert and oriented to person, place, and time. He has normal strength.  Skin: Skin is warm, dry and intact.  Psychiatric: He has a normal mood and affect. His speech is normal and behavior is normal. Judgment and thought content normal. Cognition and memory are normal.    Current Medications:   Current Outpatient Prescriptions  Medication Sig Dispense Refill  . ACCU-CHEK AVIVA PLUS test strip USE AS DIRECTED TO TEST ONCE DAILY. 50 each 6  . acetaminophen (TYLENOL) 500 MG tablet Take 500 mg by mouth every 6 (six) hours as needed for mild pain.     Marland Kitchen albuterol (PROAIR HFA) 108 (90 BASE) MCG/ACT inhaler Inhale 2 puffs into the lungs every 6 (six) hours as needed for wheezing. 1 Inhaler 1  . ALPRAZolam (XANAX) 0.5 MG tablet Take 0.5 mg by mouth 2 (two) times daily as needed for anxiety.    Marland Kitchen aspirin EC 81 MG tablet Take 81 mg by mouth daily.     . cetirizine (ZYRTEC) 10 MG tablet Take 10 mg by mouth daily.     . cholecalciferol (VITAMIN D) 1000 UNITS tablet Take  1,000 Units by mouth daily. Vit D monthly supplementation stopped by Kyle [name] in February. Changed to Vit D 1000iu qd po.    . citalopram (CELEXA) 20 MG tablet TAKE ONE TABLET BY MOUTH ONCE DAILY 90 tablet 0  . ferrous sulfate (FERROUSUL) 325 (65 FE) MG tablet Take 325 mg by mouth daily with breakfast.      . fluticasone (FLONASE) 50 MCG/ACT nasal spray Place 2 sprays into both nostrils daily as needed for allergies.     Marland Kitchen ipratropium (ATROVENT) 0.06 % nasal spray Place 2 sprays into both nostrils 4 (four) times daily. 15 mL 12  . lisinopril (PRINIVIL,ZESTRIL) 5 MG tablet Take 1 tablet (5 mg total) by mouth daily. 90 tablet 1  . metoprolol (LOPRESSOR) 50 MG tablet Take 1 tablet (50 mg total) by mouth 2 (two) times daily. 180 tablet 1  . niacin (NIASPAN) 500 MG CR tablet Take 1,000 mg by mouth at bedtime.    . nitroGLYCERIN (NITROSTAT) 0.4 MG SL tablet Place 1 tablet (0.4 mg total) under the tongue as needed. Place 0.4 mg under the tongue every 5 (five) minutes as needed. Call MD if need more than 2 3 tablet 5  . pravastatin (PRAVACHOL) 80 MG tablet TAKE ONE TABLET BY MOUTH ONCE DAILY 90 tablet 1  . SPIRIVA HANDIHALER 18 MCG inhalation capsule INHALE ONE DOSE BY MOUTH ONCE DAILY 90 capsule 0  . [DISCONTINUED] budesonide-formoterol (SYMBICORT) 160-4.5 MCG/ACT inhaler Inhale 2 puffs  into the lungs 2 (two) times daily.       No current facility-administered medications for this visit.    Functional Status:   In your present state of health, do you have any difficulty performing the following activities: 10/17/2014  Is the patient deaf or have difficulty hearing? N  Hearing N  Vision N  Difficulty concentrating or making decisions N  Walking or climbing stairs? N  Doing errands, shopping? N  Preparing Food and eating ? N  Using the Toilet? N  In the past six months, have you accidently leaked urine? Y  Do you have problems with loss of bowel control? Y  Managing your Medications? N   Managing your Finances? N  Housekeeping or managing your Housekeeping? N    Fall/Depression Screening:    PHQ 2/9 Scores 06/28/2014 01/10/2014  PHQ - 2 Score 0 1    Assessment:    Chronic Health Condition (bladder CA) - Kyle [name] completed chemotherapy for bladder cancer 2 weeks ago; he is struggling with fatigue but says he is grateful to have completed the therapy; Kyle [name] is scheduled to see Kyle [name] in follow up next month.  Chronic Health Condition (COPD) - Kyle [name] is taking COPD medications as prescribed; Kyle [name] is using O2 qhs and asked me if I though he needed to continue this since he "really hasn't had any breathing problems lately"; I advised that Kyle [name] never discontinue any prescribed therapy without discussing it first with his ordering provider; in addition, we discussed at length how O2 therapy benefits his pulmonary condition and protects his heart; he is seeing Dr. Gwenette [name] routinely; Dr. Gwenette [name] has advised Kyle [name] to pursue an aggressive weight loss regimen; we discussed exercise regimen and dietary changes as outlined in careplan.  Chronic Health Condition (DM) - Kyle [name]'s HgA1C went up on last check from 5.7 to 6.5; Kyle [name] has gained a few pounds and attributes this to low activity during chemo and "overdoing it" during the winter months; Kyle [name] has only checked his cbg 3 times in the last month and those 2 checks were in a 48 hour period.  We discussed at length carb modified diet and exercise regimen. See careplan.    Plan:   I will call Kyle [name] for telephonic assessment next month as he has several provider appointments.   [place]        Patient Outreach from 10/17/2014 in [facility] Problem One  Diabetes Disease Management needs   Care Plan for Problem One  Active   Interventions for Problem One Long Term Goal  utilizing teachback method, discussed rationale for long term management of diabetes   [facility]  Long Term Goal (31-90 days)  patient will note decrease in CBG averages over next 90 days   [facility] Long Term Goal Start Date  10/17/14   [facility] CM Short Term Goal #1 (0-30 days)  patient will resume checking cbg's at least once weekly over the next 30 days as evidenced by patient documentation and monitor readings   [facility] CM Short Term Goal #1 Start Date  10/17/14   [facility] CM Short Term Goal #2 (0-30 days)  patient will walk 10 minutes, 3 times weekly over the next 30 days   [facility] CM Short Term Goal #2 Start Date  10/17/14   [facility] CM Short Term Goal #3 (0-30 days)  patient will work to improve dietary balance over the next  30 days as evidenced by patient documentation and report of same   [facility] CM Short Term Goal #3 Start Date  10/17/14      [facility] Care Management  [phone]

## 2014-10-18 NOTE — Patient Instructions (Signed)
Basic Carbohydrate Counting for Diabetes Mellitus Carbohydrate counting is a method for keeping track of the amount of carbohydrates you eat. Eating carbohydrates naturally increases the level of sugar (glucose) in your blood, so it is important for you to know the amount that is okay for you to have in every meal. Carbohydrate counting helps keep the level of glucose in your blood within normal limits. The amount of carbohydrates allowed is different for every person. A dietitian can help you calculate the amount that is right for you. Once you know the amount of carbohydrates you can have, you can count the carbohydrates in the foods you want to eat. Carbohydrates are found in the following foods:  Grains, such as breads and cereals.  Dried beans and soy products.  Starchy vegetables, such as potatoes, peas, and corn.  Fruit and fruit juices.  Milk and yogurt.  Sweets and snack foods, such as cake, cookies, candy, chips, soft drinks, and fruit drinks. CARBOHYDRATE COUNTING There are two ways to count the carbohydrates in your food. You can use either of the methods or a combination of both. Reading the "Nutrition Facts" on Packaged Food The "Nutrition Facts" is an area that is included on the labels of almost all packaged food and beverages in the [place]. It includes the serving size of that food or beverage and information about the nutrients in each serving of the food, including the grams (g) of carbohydrate per serving.  Decide the number of servings of this food or beverage that you will be able to eat or drink. Multiply that number of servings by the number of grams of carbohydrate that is listed on the label for that serving. The total will be the amount of carbohydrates you will be having when you eat or drink this food or beverage. Learning Standard Serving Sizes of Food When you eat food that is not packaged or does not include "Nutrition Facts" on the label, you need to  measure the servings in order to count the amount of carbohydrates.A serving of most carbohydrate-rich foods contains about 15 g of carbohydrates. The following list includes serving sizes of carbohydrate-rich foods that provide 15 g ofcarbohydrate per serving:   1 slice of bread (1 oz) or 1 six-inch tortilla.    of a hamburger bun or English muffin.  4-6 crackers.   cup unsweetened dry cereal.    cup hot cereal.   cup rice or pasta.    cup mashed potatoes or  of a large baked potato.  1 cup fresh fruit or one small piece of fruit.    cup canned or frozen fruit or fruit juice.  1 cup milk.   cup plain fat-free yogurt or yogurt sweetened with artificial sweeteners.   cup cooked dried beans or starchy vegetable, such as peas, corn, or potatoes.  Decide the number of standard-size servings that you will eat. Multiply that number of servings by 15 (the grams of carbohydrates in that serving). For example, if you eat 2 cups of strawberries, you will have eaten 2 servings and 30 g of carbohydrates (2 servings x 15 g = 30 g). For foods such as soups and casseroles, in which more than one food is mixed in, you will need to count the carbohydrates in each food that is included. EXAMPLE OF CARBOHYDRATE COUNTING Sample Dinner  3 oz chicken breast.   cup of brown rice.   cup of corn.  1 cup milk.   1 cup strawberries with   sugar-free whipped topping.  Carbohydrate Calculation Step 1: Identify the foods that contain carbohydrates:   Rice.   Corn.   Milk.   Strawberries. Step 2:Calculate the number of servings eaten of each:   2 servings of rice.   1 serving of corn.   1 serving of milk.   1 serving of strawberries. Step 3: Multiply each of those number of servings by 15 g:   2 servings of rice x 15 g = 30 g.   1 serving of corn x 15 g = 15 g.   1 serving of milk x 15 g = 15 g.   1 serving of strawberries x 15 g = 15 g. Step 4: Add  together all of the amounts to find the total grams of carbohydrates eaten: 30 g + 15 g + 15 g + 15 g = 75 g. Document Released: 07/07/2005 Document Revised: 11/21/2013 Document Reviewed: 06/03/2013 [facility] Patient Information 2015 [place], [place]. This information is not intended to replace advice given to you by your health care provider. Make sure you discuss any questions you have with your health care provider.   Blood Glucose Monitoring Monitoring your blood glucose (also know as blood sugar) helps you to manage your diabetes. It also helps you and your health care provider monitor your diabetes and determine how well your treatment plan is working. WHY SHOULD YOU MONITOR YOUR BLOOD GLUCOSE?  It can help you understand how food, exercise, and medicine affect your blood glucose.  It allows you to know what your blood glucose is at any given moment. You can quickly tell if you are having low blood glucose (hypoglycemia) or high blood glucose (hyperglycemia).  It can help you and your health care provider know how to adjust your medicines.  It can help you understand how to manage an illness or adjust medicine for exercise. WHEN SHOULD YOU TEST? Your health care provider will help you decide how often you should check your blood glucose. This may depend on the type of diabetes you have, your diabetes control, or the types of medicines you are taking. Be sure to write down all of your blood glucose readings so that this information can be reviewed with your health care provider. See below for examples of testing times that your health care provider may suggest. Type 1 Diabetes  Test 4 times a day if you are in good control, using an insulin pump, or perform multiple daily injections.  If your diabetes is not well controlled or if you are sick, you may need to monitor more often.  It is a good idea to also monitor:  Before and after exercise.  Between meals and 2 hours after a  meal.  Occasionally between 2:00 a.m. and 3:00 a.m. Type 2 Diabetes  It can vary with each person, but generally, if you are on insulin, test 4 times a day.  If you take medicines by mouth (orally), test 2 times a day.  If you are on a controlled diet, test once a day.  If your diabetes is not well controlled or if you are sick, you may need to monitor more often. HOW TO MONITOR YOUR BLOOD GLUCOSE Supplies Needed  Blood glucose meter.  Test strips for your meter. Each meter has its own strips. You must use the strips that go with your own meter.  A pricking needle (lancet).  A device that holds the lancet (lancing device).  A journal or log book to write down  your results. Procedure  Wash your hands with soap and water. Alcohol is not preferred.  Prick the side of your finger (not the tip) with the lancet.  Gently milk the finger until a small drop of blood appears.  Follow the instructions that come with your meter for inserting the test strip, applying blood to the strip, and using your blood glucose meter. Other Areas to Get Blood for Testing Some meters allow you to use other areas of your body (other than your finger) to test your blood. These areas are called alternative sites. The most common alternative sites are:  The forearm.  The thigh.  The back area of the lower leg.  The palm of the hand. The blood flow in these areas is slower. Therefore, the blood glucose values you get may be delayed, and the numbers are different from what you would get from your fingers. Do not use alternative sites if you think you are having hypoglycemia. Your reading will not be accurate. Always use a finger if you are having hypoglycemia. Also, if you cannot feel your lows (hypoglycemia unawareness), always use your fingers for your blood glucose checks. ADDITIONAL TIPS FOR GLUCOSE MONITORING  Do not reuse lancets.  Always carry your supplies with you.  All blood glucose meters  have a 24-hour "hotline" number to call if you have questions or need help.  Adjust (calibrate) your blood glucose meter with a control solution after finishing a few boxes of strips. BLOOD GLUCOSE RECORD KEEPING It is a good idea to keep a daily record or log of your blood glucose readings. Most glucose meters, if not all, keep your glucose records stored in the meter. Some meters come with the ability to download your records to your home computer. Keeping a record of your blood glucose readings is especially helpful if you are wanting to look for patterns. Make notes to go along with the blood glucose readings because you might forget what happened at that exact time. Keeping good records helps you and your health care provider to work together to achieve good diabetes management.  Document Released: 07/10/2003 Document Revised: 11/21/2013 Document Reviewed: 11/29/2012 [facility] Patient Information 2015 [place], [place]. This information is not intended to replace advice given to you by your health care provider. Make sure you discuss any questions you have with your health care provider.

## 2014-10-20 ENCOUNTER — Other Ambulatory Visit: Payer: Self-pay | Admitting: *Deleted

## 2014-10-20 ENCOUNTER — Telehealth: Payer: Self-pay | Admitting: *Deleted

## 2014-10-20 MED ORDER — CITALOPRAM HYDROBROMIDE 20 MG PO TABS: 20.0000 mg | ORAL_TABLET | Freq: Every day | ORAL | Status: DC

## 2014-10-20 NOTE — Telephone Encounter (Signed)
Med sent to pharm 

## 2014-10-20 NOTE — Telephone Encounter (Signed)
Pt requesting refill on Citalopram 20 mg 1 tab daily # 90.   Last seen 05/15/14.

## 2014-10-20 NOTE — Telephone Encounter (Signed)
Ok plus three ref, pt always keeps f u appts

## 2014-10-24 ENCOUNTER — Other Ambulatory Visit: Payer: Self-pay | Admitting: *Deleted

## 2014-11-06 DIAGNOSIS — C672 Malignant neoplasm of lateral wall of bladder: Secondary | ICD-10-CM | POA: Diagnosis not present

## 2014-11-06 DIAGNOSIS — C679 Malignant neoplasm of bladder, unspecified: Secondary | ICD-10-CM | POA: Diagnosis not present

## 2014-11-13 ENCOUNTER — Other Ambulatory Visit: Payer: Self-pay | Admitting: *Deleted

## 2014-11-13 MED ORDER — TIOTROPIUM BROMIDE MONOHYDRATE 18 MCG IN CAPS: ORAL_CAPSULE | RESPIRATORY_TRACT | Status: DC

## 2014-11-13 MED ORDER — LISINOPRIL 5 MG PO TABS: 5.0000 mg | ORAL_TABLET | Freq: Every day | ORAL | Status: DC

## 2014-11-13 NOTE — Patient Outreach (Signed)
Kyle [name]) Care Management     11/13/2014  Kyle [name] 27-Jan-1934 [id number]  Subjective: "I think I'm doing really good right now. "  Objective: Mr. Climer is a very pleasant, well informed, engaged 81 year old patient who has been followed by [facility] for greater than 2 years. He has multiple chronic medical problems including HTN, DMII, CAD, COPD, and CKD. Mr. Yeakle currently has 8 physicians on his care team. During Mr. Rhew time with [facility] we have concentrated on DM and COPD management. However, he has had instances of bladder CA recurrence and treatment, moderately severe illness with flu, diplopia, ataxia and gait disturbance, and episodes of moderate to several gastrointestinal illness with protracted diarrhea.   Currently, Mr. Granville is doing well by his own report and feels he is "stable".      Current Medications: Current Outpatient Prescriptions  Medication Sig Dispense Refill  . ACCU-CHEK AVIVA PLUS test strip USE AS DIRECTED TO TEST ONCE DAILY. 50 each 6  . acetaminophen (TYLENOL) 500 MG tablet Take 500 mg by mouth every 6 (six) hours as needed for mild pain.     Marland Kitchen albuterol (PROAIR HFA) 108 (90 BASE) MCG/ACT inhaler Inhale 2 puffs into the lungs every 6 (six) hours as needed for wheezing. 1 Inhaler 1  . ALPRAZolam (XANAX) 0.5 MG tablet Take 0.5 mg by mouth 2 (two) times daily as needed for anxiety.    Marland Kitchen aspirin EC 81 MG tablet Take 81 mg by mouth daily.     . cetirizine (ZYRTEC) 10 MG tablet Take 10 mg by mouth daily.     . cholecalciferol (VITAMIN D) 1000 UNITS tablet Take 1,000 Units by mouth daily. Vit D monthly supplementation stopped by Dr. Shon Millet in February. Changed to Vit D 1000iu qd po.    . citalopram (CELEXA) 20 MG tablet Take 1 tablet (20 mg total) by mouth daily. 90 tablet 3  . ferrous sulfate (FERROUSUL) 325 (65 FE) MG tablet Take 325 mg by mouth daily with breakfast.      . fluticasone (FLONASE) 50 MCG/ACT  nasal spray Place 2 sprays into both nostrils daily as needed for allergies.     Marland Kitchen ipratropium (ATROVENT) 0.06 % nasal spray Place 2 sprays into both nostrils 4 (four) times daily. 15 mL 12  . lisinopril (PRINIVIL,ZESTRIL) 5 MG tablet Take 1 tablet (5 mg total) by mouth daily. 90 tablet 0  . metoprolol (LOPRESSOR) 50 MG tablet Take 1 tablet (50 mg total) by mouth 2 (two) times daily. 180 tablet 1  . niacin (NIASPAN) 500 MG CR tablet Take 1,000 mg by mouth at bedtime.    . nitroGLYCERIN (NITROSTAT) 0.4 MG SL tablet Place 1 tablet (0.4 mg total) under the tongue as needed. Place 0.4 mg under the tongue every 5 (five) minutes as needed. Call MD if need more than 2 3 tablet 5  . pravastatin (PRAVACHOL) 80 MG tablet TAKE ONE TABLET BY MOUTH ONCE DAILY 90 tablet 1  . tiotropium (SPIRIVA HANDIHALER) 18 MCG inhalation capsule INHALE ONE DOSE BY MOUTH ONCE DAILY 90 capsule 0  . [DISCONTINUED] budesonide-formoterol (SYMBICORT) 160-4.5 MCG/ACT inhaler Inhale 2 puffs into the lungs 2 (two) times daily.       No current facility-administered medications for this visit.    Functional Status: In your present state of health, do you have any difficulty performing the following activities: 11/13/2014 10/17/2014  Hearing? N N  Vision? N N  Difficulty concentrating or making  decisions? N N  Walking or climbing stairs? N N  Dressing or bathing? N N  Doing errands, shopping? N N  Preparing Food and eating ? - N  Using the Toilet? - N  In the past six months, have you accidently leaked urine? - Y  Do you have problems with loss of bowel control? - Y  Managing your Medications? - N  Managing your Finances? - N  Housekeeping or managing your Housekeeping? - N    Depression Screening: PHQ 2/9 Scores 11/13/2014 06/28/2014 01/10/2014  PHQ - 2 Score 0 0 1    Assessment:  Acute/Chronic Health Condition (bladder CA) - Mr. Dyckman finished his last chemo treatment almost a month ago; on my last visit, he said he was  tired but says he feels treatments went well; he is to see the urologist in follow up in the next 2 weeks - appointment is scheduled [facility]er[facility] Bach, [facility])   Chronic Health Condition (DM) - Mr. Sprong's HGA1C increased in January to 6.4 from 5.27 March 2014; Mr. Deleo admits to not checking his cbg's as regularly and maybe committing dietary indiscretion during his focus on bladder CA treatment; Mr. Macpherson also has not felt well enough to do his walks for exercise and the weather has been poor; he will try to start walking outside for exercise now; we reviewed carb modified diet and discussed adherence; in addition, I've advised Mr. Haven to check cbg's at least twice weekly while we work to help him get A1C back under 6   Plan:  Mr. Pheasant is doing well currently. He says he feels confident in his ability to self monitor. He can verbalize signs and symptoms of hypoglycemia, hyperglycemia, worsening respiratory condition, heart attack, and stroke. He is taking medications as prescribed. Though he has struggled with dietary indiscretion and not exercising, he says he is feeling better as time passes after completion of chemo. Mr. Litzau wife is very supportive. Mr. Zumstein attends all provider appointments and does an excellent job of keeping up with health maintenance visit (eye doctor, dentist, etc...).   I believe Mr. Colpitts would benefit from continued oversight and surveillance but does not need need continued monthly face to face visits. I discussed this with Mr. Grob and he readily agreed to be transitioned to the [facility] Telephonic Disease Management program. Mr. Toft understands that should he need face to face visits again at any time in the future, he can let the telephonic nurse know of his concerns or he can call me.   [facility] CM Care Plan Problem One        Patient Outreach Telephone from 11/13/2014 in [place]   Patient Outreach from 10/17/2014 in [place] Problem One  Diabetes Disease Management needs  Diabetes Disease Management needs   Care Plan for Problem One  Active  Active   [facility] Long Term Goal Start Date  10/17/14  10/17/14   Interventions for Problem One Long Term Goal  utilizing teachback method, discussed rationale for long term management of diabetes  utilizing teachback method, discussed rationale for long term management of diabetes   [facility] CM Short Term Goal #1 (0-30 days)  patient will resume checking cbg's at least once weekly over the next 30 days as evidenced by patient documentation and monitor readings  patient will resume checking cbg's at least once weekly over the next 30 days as evidenced by patient documentation and monitor readings  [facility] CM Short Term Goal #1 Start Date  10/17/14  10/17/14   Interventions for Short Term Goal #1  advised patient check cbg's at least once weekly,  advised that he choose a particular day or days to check cbg's  advised patient check cbg's at least once weekly,  advised that he choose a particular day or days to check cbg's   [facility] CM Short Term Goal #2 (0-30 days)  patient will walk 10 minutes, 3 times weekly over the next 30 days  patient will walk 10 minutes, 3 times weekly over the next 30 days   [facility] CM Short Term Goal #2 Start Date  10/17/14  10/17/14   Interventions for Short Term Goal #2  discussed patient driven goal for exercise program  discussed patient driven goal for exercise program   [facility] CM Short Term Goal #3 (0-30 days)  patient will work to improve dietary balance over the next 30 days as evidenced by patient documentation and report of same  patient will work to improve dietary balance over the next 30 days as evidenced by patient documentation and report of same   [name] CM Short Term Goal #3 Start Date  10/17/14  10/17/14   Interventions for Short [place]ern Goal #3  discussed 50/25/25 plate method utilizing teachback method  discussed 50/25/25 plate method utilizing [facility]  [phone]

## 2014-11-15 ENCOUNTER — Encounter: Payer: Self-pay | Admitting: Family Medicine

## 2014-11-15 ENCOUNTER — Ambulatory Visit (INDEPENDENT_AMBULATORY_CARE_PROVIDER_SITE_OTHER): Payer: Medicare Other | Admitting: Family Medicine

## 2014-11-15 VITALS — BP 128/82 | Ht 72.0 in | Wt 268.8 lb

## 2014-11-15 DIAGNOSIS — R1030 Lower abdominal pain, unspecified: Secondary | ICD-10-CM | POA: Diagnosis not present

## 2014-11-15 DIAGNOSIS — H9193 Unspecified hearing loss, bilateral: Secondary | ICD-10-CM

## 2014-11-15 DIAGNOSIS — E119 Type 2 diabetes mellitus without complications: Secondary | ICD-10-CM | POA: Diagnosis not present

## 2014-11-15 DIAGNOSIS — I10 Essential (primary) hypertension: Secondary | ICD-10-CM | POA: Diagnosis not present

## 2014-11-15 DIAGNOSIS — J449 Chronic obstructive pulmonary disease, unspecified: Secondary | ICD-10-CM

## 2014-11-15 LAB — POCT GLYCOSYLATED HEMOGLOBIN (HGB A1C): Hemoglobin A1C: 6.3

## 2014-11-15 MED ORDER — FLUTICASONE PROPIONATE 50 MCG/ACT NA SUSP: 2.0000 | Freq: Every day | NASAL | Status: DC | PRN

## 2014-11-15 MED ORDER — METOPROLOL TARTRATE 50 MG PO TABS: 50.0000 mg | ORAL_TABLET | Freq: Two times a day (BID) | ORAL | Status: DC

## 2014-11-15 MED ORDER — CETIRIZINE HCL 10 MG PO TABS: 10.0000 mg | ORAL_TABLET | Freq: Every day | ORAL | Status: DC

## 2014-11-15 MED ORDER — NIACIN ER (ANTIHYPERLIPIDEMIC) 500 MG PO TBCR: 1000.0000 mg | EXTENDED_RELEASE_TABLET | Freq: Every day | ORAL | Status: DC

## 2014-11-15 MED ORDER — TIOTROPIUM BROMIDE MONOHYDRATE 18 MCG IN CAPS: ORAL_CAPSULE | RESPIRATORY_TRACT | Status: DC

## 2014-11-15 MED ORDER — ALPRAZOLAM 0.5 MG PO TABS: 0.5000 mg | ORAL_TABLET | Freq: Two times a day (BID) | ORAL | Status: DC | PRN

## 2014-11-15 MED ORDER — LISINOPRIL 5 MG PO TABS: 5.0000 mg | ORAL_TABLET | Freq: Every day | ORAL | Status: DC

## 2014-11-15 MED ORDER — IPRATROPIUM BROMIDE 0.06 % NA SOLN: 2.0000 | Freq: Four times a day (QID) | NASAL | Status: DC

## 2014-11-15 MED ORDER — PRAVASTATIN SODIUM 80 MG PO TABS: 80.0000 mg | ORAL_TABLET | Freq: Every day | ORAL | Status: DC

## 2014-11-15 MED ORDER — CITALOPRAM HYDROBROMIDE 20 MG PO TABS: 20.0000 mg | ORAL_TABLET | Freq: Every day | ORAL | Status: DC

## 2014-11-15 MED ORDER — ALBUTEROL SULFATE HFA 108 (90 BASE) MCG/ACT IN AERS: 2.0000 | INHALATION_SPRAY | Freq: Four times a day (QID) | RESPIRATORY_TRACT | Status: DC | PRN

## 2014-11-15 NOTE — Progress Notes (Signed)
   Subjective:    Patient ID: Kyle [name], male    DOB: [birth year], 81 y.o.   MRN: [id number]  Diabetes He presents for his follow-up diabetic visit. He has type 2 diabetes mellitus. Risk factors for coronary artery disease include dyslipidemia, diabetes mellitus, hypertension and obesity. Current diabetic treatment includes diet. His weight is stable. He is following a diabetic diet. He has not had a previous visit with a dietitian. He does not see a podiatrist.Eye exam is not current.   Patient would like referral about hearing a  nd has has a stomach ache when going to bed a few times lately.pt changed probiotics and it helped the stool. More of a yellow.  Lower abd discomfort at times. Diffuse. No major change in bowel habits. Has not had a colonoscopy for 5 years. Patient is due a colonoscopy this summer.  Ongoing challenges with breathing. Overall stable. No major episodes of wheezing.  Compliant with blood pressure medicine. No obvious side effects watching salt intake.  hearing has gone [facility] if he needs to see and he and he.  Review of Systems No headache no chest pain no back pain no change in bowel habits no blood in stool    Objective:   Physical Exam Alert vital stable HEENT normal neck supple lungs clear heart regular in rhythm ankles without edema Right TM partially obscured with wax left TM normal Abdomen good bowel sounds no discrete tenderness no rebound no masses     Assessment & Plan:  Impression low abdominal discomfort. Nonspecific. Due a colonoscopy anyway this summer. Discussed #2 progressive difficulty hearing. Moderate amount of wax right ear none left. #3 type 2 diabetes control good. A1c 6.3% plan diet exercise discussed. ENT referral. GI referral. Follow-up as scheduled WSL easily 40 minutes spent most in discussion

## 2014-11-18 DIAGNOSIS — J449 Chronic obstructive pulmonary disease, unspecified: Secondary | ICD-10-CM | POA: Diagnosis not present

## 2014-11-21 ENCOUNTER — Other Ambulatory Visit: Payer: Self-pay

## 2014-11-21 NOTE — Patient Outreach (Signed)
[facility]) Care Management  11/21/2014  Kyle [name] Dec 13, 1933 [id number]   First telephone call to patient regarding transfer to telephonic health coach.  Wife states patient not at home.  HIPAA compliant message left.    Plan: Will attempt patient within 3 business days.   [name], RN, MSN [place] Telephonic [facility] Care Management [phone]

## 2014-11-22 ENCOUNTER — Other Ambulatory Visit: Payer: Self-pay

## 2014-11-22 NOTE — Patient Outreach (Signed)
[facility] [facility]) Care Management  11/22/2014  Kyle [name] 1933/08/25 [id number]  Telephone call to introduce self to patient.  Patient states he is doing good this morning.  Advised on health coach role and that health coach would call later in the month.  Patient verbalized understanding and agrees to next outreach.    Plan: RN Health Coach will contact patient within the next month.    [name], RN, MSN [place] [facility] [phone]

## 2014-12-07 ENCOUNTER — Ambulatory Visit: Payer: Medicare Other

## 2014-12-08 ENCOUNTER — Other Ambulatory Visit: Payer: Self-pay

## 2014-12-08 NOTE — Patient Outreach (Signed)
[name]) Care Management  12/08/2014  Kyle [name] October 12, 1933 [id number]     Monthly telephone call to patient he reports he is doing pretty good.  He still reports he does not have a lot of energy but is better.  Patient reports that he is checking his blood sugar and he tries at least once a week but not quite yet. Re-emphasized goal of trying to check sugars at least weekly.  He verbalized understanding.  He reports last sugar was 127 and last A1c was 6.3.  He states he tries to stay away from sweets as much as possible.  Patient reports that he is walking at least three times a week.  He also reports he sees his urologist in the next 2-3 months for a check up.     Assessment: Patient working on established goals for self maintenance.  Patient doing well with blood sugar control.  Patient energy levels continue to improve after chemotherapy treatments.   Patient nearing goal completion.  Discussed with patient of possible closure on next month's call.  Patient ok with this plan.    Plan: RN Health Coach will contact within the next month for possible case closure call.  Patient in agreement with next outreach call and case closure due to meeting goals and doing well.   Kyle Baseman, RN, MSN [place] [phone]

## 2014-12-18 DIAGNOSIS — J449 Chronic obstructive pulmonary disease, unspecified: Secondary | ICD-10-CM | POA: Diagnosis not present

## 2014-12-20 ENCOUNTER — Other Ambulatory Visit (HOSPITAL_COMMUNITY): Payer: Self-pay | Admitting: Oncology

## 2014-12-20 DIAGNOSIS — C3491 Malignant neoplasm of unspecified part of right bronchus or lung: Secondary | ICD-10-CM

## 2014-12-21 ENCOUNTER — Ambulatory Visit (INDEPENDENT_AMBULATORY_CARE_PROVIDER_SITE_OTHER): Payer: Medicare Other | Admitting: Otolaryngology

## 2014-12-21 DIAGNOSIS — H6121 Impacted cerumen, right ear: Secondary | ICD-10-CM

## 2014-12-21 DIAGNOSIS — H903 Sensorineural hearing loss, bilateral: Secondary | ICD-10-CM | POA: Diagnosis not present

## 2014-12-25 ENCOUNTER — Encounter (HOSPITAL_COMMUNITY): Payer: Medicare Other | Attending: Hematology & Oncology

## 2014-12-25 ENCOUNTER — Ambulatory Visit (HOSPITAL_COMMUNITY)
Admission: RE | Admit: 2014-12-25 | Discharge: 2014-12-25 | Disposition: A | Discharge status: Home / Self Care | Payer: Medicare Other | Source: Ambulatory Visit | Attending: Hematology & Oncology | Admitting: Hematology & Oncology

## 2014-12-25 DIAGNOSIS — I2510 Atherosclerotic heart disease of native coronary artery without angina pectoris: Secondary | ICD-10-CM | POA: Diagnosis not present

## 2014-12-25 DIAGNOSIS — I714 Abdominal aortic aneurysm, without rupture: Secondary | ICD-10-CM | POA: Diagnosis not present

## 2014-12-25 DIAGNOSIS — C3431 Malignant neoplasm of lower lobe, right bronchus or lung: Secondary | ICD-10-CM | POA: Diagnosis not present

## 2014-12-25 DIAGNOSIS — C679 Malignant neoplasm of bladder, unspecified: Secondary | ICD-10-CM | POA: Insufficient documentation

## 2014-12-25 DIAGNOSIS — C3490 Malignant neoplasm of unspecified part of unspecified bronchus or lung: Secondary | ICD-10-CM

## 2014-12-25 DIAGNOSIS — D693 Immune thrombocytopenic purpura: Secondary | ICD-10-CM

## 2014-12-25 DIAGNOSIS — Z720 Tobacco use: Secondary | ICD-10-CM | POA: Diagnosis not present

## 2014-12-25 DIAGNOSIS — C3491 Malignant neoplasm of unspecified part of right bronchus or lung: Secondary | ICD-10-CM | POA: Diagnosis not present

## 2014-12-25 DIAGNOSIS — Z902 Acquired absence of lung [part of]: Secondary | ICD-10-CM | POA: Insufficient documentation

## 2014-12-25 DIAGNOSIS — Z08 Encounter for follow-up examination after completed treatment for malignant neoplasm: Secondary | ICD-10-CM | POA: Insufficient documentation

## 2014-12-25 DIAGNOSIS — I700 Atherosclerosis of aorta: Secondary | ICD-10-CM | POA: Diagnosis not present

## 2014-12-25 DIAGNOSIS — R918 Other nonspecific abnormal finding of lung field: Secondary | ICD-10-CM | POA: Diagnosis not present

## 2014-12-25 DIAGNOSIS — R590 Localized enlarged lymph nodes: Secondary | ICD-10-CM | POA: Diagnosis not present

## 2014-12-25 DIAGNOSIS — D696 Thrombocytopenia, unspecified: Secondary | ICD-10-CM | POA: Insufficient documentation

## 2014-12-25 DIAGNOSIS — Z85118 Personal history of other malignant neoplasm of bronchus and lung: Secondary | ICD-10-CM | POA: Diagnosis not present

## 2014-12-25 LAB — CBC WITH DIFFERENTIAL/PLATELET
Basophils Absolute: 0 10*3/uL (ref 0.0–0.1)
Basophils Relative: 0 % (ref 0–1)
EOS ABS: 0.2 10*3/uL (ref 0.0–0.7)
Eosinophils Relative: 3 % (ref 0–5)
HCT: 46.9 % (ref 39.0–52.0)
HEMOGLOBIN: 15.4 g/dL (ref 13.0–17.0)
LYMPHS PCT: 29 % (ref 12–46)
Lymphs Abs: 2.2 10*3/uL (ref 0.7–4.0)
MCH: 31.6 pg (ref 26.0–34.0)
MCHC: 32.8 g/dL (ref 30.0–36.0)
MCV: 96.3 fL (ref 78.0–100.0)
Monocytes Absolute: 0.5 10*3/uL (ref 0.1–1.0)
Monocytes Relative: 7 % (ref 3–12)
NEUTROS ABS: 4.5 10*3/uL (ref 1.7–7.7)
Neutrophils Relative %: 61 % (ref 43–77)
Platelets: 118 10*3/uL — ABNORMAL LOW (ref 150–400)
RBC: 4.87 MIL/uL (ref 4.22–5.81)
RDW: 15.5 % (ref 11.5–15.5)
WBC: 7.5 10*3/uL (ref 4.0–10.5)

## 2014-12-25 LAB — COMPREHENSIVE METABOLIC PANEL
ALBUMIN: 3.9 g/dL (ref 3.5–5.0)
ALK PHOS: 73 U/L (ref 38–126)
ALT: 15 U/L — ABNORMAL LOW (ref 17–63)
AST: 14 U/L — ABNORMAL LOW (ref 15–41)
Anion gap: 7 (ref 5–15)
BUN: 25 mg/dL — AB (ref 6–20)
CALCIUM: 9.2 mg/dL (ref 8.9–10.3)
CO2: 28 mmol/L (ref 22–32)
Chloride: 107 mmol/L (ref 101–111)
Creatinine, Ser: 1.69 mg/dL — ABNORMAL HIGH (ref 0.61–1.24)
GFR calc Af Amer: 43 mL/min — ABNORMAL LOW (ref >60)
GFR calc non Af Amer: 37 mL/min — ABNORMAL LOW (ref >60)
Glucose, Bld: 141 mg/dL — ABNORMAL HIGH (ref 65–99)
Potassium: 4.6 mmol/L (ref 3.5–5.1)
Sodium: 142 mmol/L (ref 135–145)
Total Bilirubin: 1.2 mg/dL (ref 0.3–1.2)
Total Protein: 7.1 g/dL (ref 6.5–8.1)

## 2014-12-25 LAB — POCT I-STAT CREATININE: Creatinine, Ser: 1.7 mg/dL — ABNORMAL HIGH (ref 0.61–1.24)

## 2014-12-25 MED ORDER — IOHEXOL 300 MG/ML  SOLN
100.0000 mL | Freq: Once | INTRAMUSCULAR | Status: AC | PRN
  Administered 2014-12-25: 80 mL via INTRAVENOUS

## 2014-12-25 NOTE — Progress Notes (Signed)
LABS DRAWN

## 2014-12-28 ENCOUNTER — Encounter (HOSPITAL_BASED_OUTPATIENT_CLINIC_OR_DEPARTMENT_OTHER): Payer: Medicare Other | Admitting: Hematology & Oncology

## 2014-12-28 ENCOUNTER — Ambulatory Visit (HOSPITAL_COMMUNITY): Payer: Self-pay | Admitting: Hematology & Oncology

## 2014-12-28 VITALS — BP 141/82 | HR 63 | Temp 97.7°F | Resp 18 | Wt 271.1 lb

## 2014-12-28 DIAGNOSIS — Z85110 Personal history of malignant carcinoid tumor of bronchus and lung: Secondary | ICD-10-CM | POA: Diagnosis not present

## 2014-12-28 DIAGNOSIS — Z720 Tobacco use: Secondary | ICD-10-CM

## 2014-12-28 DIAGNOSIS — D696 Thrombocytopenia, unspecified: Secondary | ICD-10-CM

## 2014-12-28 DIAGNOSIS — C679 Malignant neoplasm of bladder, unspecified: Secondary | ICD-10-CM

## 2014-12-28 DIAGNOSIS — C3431 Malignant neoplasm of lower lobe, right bronchus or lung: Secondary | ICD-10-CM

## 2014-12-28 NOTE — Progress Notes (Signed)
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  INTERVAL HISTORY: Kyle [name] 81 y.o. male returns for  regular  visit for followup of Stage I a (T1 B., N0, M0) moderately differentiated adenocarcinoma the lung, 2.3 cm in size with surgery on 04/03/2011. Margins were clear, no LV I was seen, and there is no pleural involvement. 6 lymph nodes were all negative getting stage IA disease.  AND Thrombocytopenia thus far consistent with chronic ITP of adults versus drug-induced mild thrombocytopenia. He is on too many medications to withdraw them completely. There is no evidence on his Feb 2014 CT scan of splenomegaly or cirrhosis.   He is here today with his wife. He continues with ongoing surveillance and treatment for bladder cancer.  He says that he is doing fine.  He is still smoking about a half a pack per day.  He rides his golf cart to get the mail and garden.  His wife states that he did rehab here at [facility] previously and did really well.  She would like for him to start back on it again. He doesn't feel able to because his wife has been diagnosed with Stage III breast cancer.  She will going for a second opinion at [facility] this upcoming Monday. Her husband is worried about caring for his wife.  He sleeps well at night using a CPAP machine. He has had 2 colonoscopy's, Dr. Sydell Axon. He gets his flu shot every year.     Adenocarcinoma of right lung   04/03/2011 Initial Diagnosis INVASIVE MODERATELY DIFFERENTIATED ADENOCARCINOMA, 2.3 CM. (T1b, N0).  Clear margins, no LVI, no pleural involvement. 0/6 nodes.   04/03/2011 Surgery Right lower lobe superior segmentectomy by Dr. Arlyce Dice   04/04/2011 Remission    03/07/2013 Imaging CT of chest-  developing adenopathy in the subcarinal and azygo-esophageal recess stations, mild.  Findings are  suspicious for recurrent or localized metastatic disease.    03/16/2013 Imaging PET scan- No definite metabolically active pulmonary lesion and no mediastinal or hilar lymphadenopathy.     06/15/2013 Imaging CT scan of chest- Ill-defined pulmonary density in RLL in the region of patient's prior surgery, less prominent than on prior CT obtained for PET-CT of 02/25/2013. This is most likely region ofscarring. Underlying tumor cannot be excluded   09/19/2013 Imaging CT of chest- Progressive volume loss medially at the right lung base. Differential diagnostic considerations include mucus plugging, radiation therapy related findings, or progressive bronchogenic carcinoma   10/03/2013 Imaging PET scan- Low level residual FDG uptake in the area of previous surgery with moderate atelectasis. I do not see any findings suspicious for recurrent or residual tumor.   10/10/2013 Procedure Bronchcospy with biopsies and brushings by Dr. Roxan Hockey   10/10/2013 Pathology Results RLL biopsies are negative for malignancy    Bladder carcinoma   03/27/2011 Initial Diagnosis Bladder carcinoma    - 10/05/2013 Chemotherapy Completed continuous bladder infusion chemotherapy at [facility]     Past Medical History  Diagnosis Date  . Arteriosclerotic cardiovascular disease (ASCVD) 1973, 12/2010    S/P NSTEMI secondary to distal RCA/PL lesion, tx medically.  EF of  55%-60% per  echo.  . Diabetes mellitus     Type II  . Thrombocytopenia   . COPD (chronic obstructive pulmonary disease)   . AAA (abdominal aortic aneurysm) 2004    s/p repair 2004; 4.3 cm infrarenal  in 05/2011  . Tobacco abuse     50-pack-year consumption; quit in 12/2010  . Hyperlipidemia   . OSA (obstructive sleep apnea)   . Hypertension   . Benign prostatic hypertrophy     s/p transurethral resection of the prostate  . Obesity   . Tubular adenoma of colon   . Cataract   . Ventral hernia   . Diverticulosis   . Bilateral renal masses     Cystic, more prominent  on CT in 12/2010 than 2007; followed by Dr. Rosana Hoes  . ITP (idiopathic thrombocytopenic purpura) 09/07/2012    Chronic ITP of adults versus medication-induced ITP.  Stable  . Insomnia   . Bladder cancer 1996    Transurethral resection of the bladder + chemotherapy/BCG as premed  . Adenocarcinoma, lung 01/2011    transthoracic FNA; resection of the superior segment of the RLL in 03/2011; negative nodes; no chemotherapy nor radiation planned  . Chronic kidney disease     Creatinine 1.4 on discharge 12/20/2100; proteinuria; normal renal ultrasound in 2010; recent creatinines of 1.7-2.; Bilateral cystic renal masses by CT in 2011  . Nephrolithiasis 2012    ARF in 01/2011 due to obstructing nephrolithiasis  . Arm fracture     right arm  . Essential and other specified forms of tremor 02/23/2013  . Diplopia 02/23/2013  . Polyneuropathy in diabetes(357.2) 02/23/2013  . Abnormality of gait 02/23/2013  . Hx of Clostridium difficile infection   . Adenocarcinoma of right lung 02/24/2011    Ct A/P 2012:  2cm lung mass RLL PET 6160:  Hypermetabolic RLL mass, no other hypermetabolic areas. TTNA 02/2011:  Adenocarcinoma, markers c/w lung origin Right lower lobe superior segmentectomy. 04/01/2011 Dr. Arlyce Dice   . Coronary artery disease   . Myocardial infarction   . Cough     thick phlegm    has Arteriosclerotic cardiovascular disease (ASCVD); Hypertension; CKD (chronic kidney disease) stage 3, GFR 30-59 ml/min; COPD (chronic obstructive pulmonary disease); Adenocarcinoma of right lung; Obstructive sleep apnea; Fasting hyperglycemia; Nephrolithiasis; Bladder carcinoma; Diarrhea; Tubular adenoma of colon; Ventral hernia; AAA (abdominal aortic aneurysm); Tobacco abuse; LLQ pain; ITP (idiopathic thrombocytopenic purpura); Diabetes; Fracture of right distal radius; COPD exacerbation; Essential and other specified forms of tremor; Diplopia; Diabetic polyneuropathy; and Abnormality of gait on his problem list.     is allergic to  codeine and etodolac.  Mr. Tarnowski does not currently have medications on file.  Past Surgical History  Procedure Laterality Date  . Transurethral resection of prostate    . Cystectomy    . Tonsillectomy    . Colonoscopy  03/19/2010    Dr. Gala Romney -(poor prep) Anal papilla, rectal hyperplastic polyp, tubular adenoma removed splenic flexure, left-sided diverticula  . Wedge resection  04/2011    carcinoma of lung  . Colonoscopy  11/28/2004    RMR:  Diminutive rectal and left colon polyps as described above, cold  biopsied/removed/  Left sided diverticula. The remainder of the colonic mucosa appeared normal.  . Colonoscopy   09/15/01    RMR: Multiple diminutive polyps destroyed with dermolysis as described above/ Multiple small polyps on stalks in the colon resected with snare cautery/ Scattered pan colonic diverticulum/ The remainder of the colonic mucosa appeared normal  . Abdominal aortic aneurysm repair  2004  . Cardiac catheterization    . Lung lobectomy    . Video bronchoscopy with endobronchial navigation N/A 10/10/2013    Procedure: VIDEO BRONCHOSCOPY WITH ENDOBRONCHIAL NAVIGATION;  Surgeon: Melrose Nakayama, MD;  Location:  [facility] OR;  Service: Thoracic;  Laterality: N/A;  NO BLOOD THINNERS BUT PATIENT HAS ITP  . Cystoscopy  04/2014    Denies any headaches, dizziness, double vision, fevers, chills, night sweats, nausea, vomiting, diarrhea, chest pain, heart palpitations, shortness of breath, blood in stool, black tarry stool, urinary pain, urinary burning, urinary frequency, hematuria. Positive for constipation and joint pain. 14 point review of systems was performed and is negative except as detailed under history of present illness and above    PHYSICAL EXAMINATION  ECOG PERFORMANCE STATUS: 1 - Symptomatic but completely ambulatory  Filed Vitals:   12/28/14 0852  BP: 141/82  Pulse: 63  Temp: 97.7 F (36.5 C)  Resp: 18    GENERAL:alert, no distress, well nourished, well  developed, comfortable, cooperative, obese and smiling SKIN: skin color, texture, turgor are normal, no rashes or significant lesions HEAD: Normocephalic, No masses, lesions, tenderness or abnormalities EYES: normal, PERRLA, EOMI, Conjunctiva are pink and non-injected EARS: External ears normal OROPHARYNX:mucous membranes are moist  NECK: supple, trachea midline LYMPH:  No axillary adenopathy, no adenopathy in the neck or supraclavicular areas BREAST:not examined LUNGS: clear to auscultation  HEART: regular rate & rhythm, no murmurs and no gallops ABDOMEN:obese Large abdominal wall Hernia, easily reduced. BACK: Back symmetric, no curvature. EXTREMITIES:less then 2 second capillary refill, no skin discoloration, no cyanosis  NEURO: alert & oriented x 3 with fluent speech, no focal motor/sensory deficits, gait is slow, slight limp   LABORATORY DATA: CBC    Component Value Date/Time   WBC 7.5 12/25/2014 0858   RBC 4.87 12/25/2014 0858   HGB 15.4 12/25/2014 0858   HCT 46.9 12/25/2014 0858   PLT 118* 12/25/2014 0858   MCV 96.3 12/25/2014 0858   MCH 31.6 12/25/2014 0858   MCHC 32.8 12/25/2014 0858   RDW 15.5 12/25/2014 0858   LYMPHSABS 2.2 12/25/2014 0858   MONOABS 0.5 12/25/2014 0858   EOSABS 0.2 12/25/2014 0858   BASOSABS 0.0 12/25/2014 0858     CLINICAL DATA: 82 year old male with a history of lung cancer 4 years prior.  Superior segment right lower lobe resection with invasive moderately differentiated adenocarcinoma, (size 2.3 cm) pathology report 04/03/2011. Six lymph nodes negative for metastatic disease (TNM = T1b,N0).  EXAM: CT CHEST WITH CONTRAST  IMPRESSION: Re- demonstration of surgical changes of the right lower lobe, compatible with superior segment resection (04/03/2011), with improving nodular opacity at the surgical site, and no evidence on the current of progression/recurrence. Continued surveillance is recommended.  Atherosclerosis involving  the thoracic aorta and the branch vessels. Soft plaque at the innominate artery contributes to narrowing, though likely not hemodynamically significant. This can be confirmed with right and left upper extremity systolic blood pressure surveillance, with development of a differential of greater than 15 mmHg indicating hemodynamic significance.  Two vessel coronary artery disease.  Partially imaged juxta-renal abdominal aortic aneurysm, which measures 4.4 cm on the current, and 4.3 cm on CT abdomen 01/22/2011. Recommend followup by ultrasound in 1 year. This recommendation follows ACR consensus guidelines: White Paper of the ACR Incidental Findings Committee II on Vascular Findings. J Am Coll Radiol 2013; 10:789-794.  Left adrenal lesion compatible with adenoma on previous abdominal CT.  Additional incidental as above.  Signed,  Dulcy Fanny. Earleen Newport, DO  [facility]  [facility]   Electronically Signed  By: Corrie Mckusick D.O.  On: 12/25/2014 10:04   PATHOLOGY:  10/10/2013  Diagnosis 1. Lung, biopsy, RLL - BENIGN LUNG TISSUE, SEE COMMENT. -  NEGATIVE FOR ATYPIA OR MALIGNANCY. 2. Lung, biopsy, RLL - BENIGN LUNG TISSUE, SEE COMMENT. - NEGATIVE FOR ATYPIA OR MALIGNANCY. Microscopic Comment 1. Multiple biopsies demonstrate non neoplastic lung with reactive bronchial respiratory-type epithelium overlying thickened basement membrane. Within the subepithelial tissue, there is prominent lymphoplasmacytic inflammation and edema. There are no features of epithelial dysplasia or malignancy present. The case was reviewed with Dr. Gari Crown who concurs. 2. Multiple biopsies demonstrate non neoplastic lung with reactive bronchial respiratory-type epithelium overlying thickened basement. Within the subepithelial tissue, there is chronic lymphoplasmacytic inflammation, fibroelastotic stromal change, and edema. There is incidental  oxyntic metaplasia of benign salivary-type glands. There are no features of epithelial dysplasia or malignancy present. (CRR:gt, 10/12/13) Mali RUND DO Pathologist, Electronic Signature (Case signed 10/12/2013)    ASSESSMENT:  1. Stage I a (T1 B., N0, M0) moderately differentiated adenocarcinoma the lung, 2.3 cm in size with surgery on 04/03/2011. Margins were clear, no LV I was seen, and there is no pleural involvement. 6 lymph nodes were all negative getting stage IA disease.  2. Thrombocytopenia thus far consistent with chronic ITP of adults versus drug-induced mild thrombocytopenia. He is on too many medications to withdraw them completely. There is no evidence on his May 2013 CT scan of splenomegaly or cirrhosis. Stable.  3. Obesity  4. Intermittent diarrhea treated by GI  5. Bladder cancer followed by Dr. Rosana Hoes  6. MI x2 the last in May 2012  7. Abdominal hernia secondary to abdominal aortic aneurysm surgery in 2008  8. Double vision in the past which has not been a recent issue  9. History of skin cancer  10. Chronic renal disease, grade 3, followed by nephrologist  11. Elevated glucose, followed by Dr. Wolfgang Phoenix. 12. S/P bronchoscopy by Dr. Roxan Hockey on 10/10/2013 with biopsies and they are negative.  Patient Active Problem List   Diagnosis Date Noted  . Essential and other specified forms of tremor 02/23/2013  . Diplopia 02/23/2013  . Diabetic polyneuropathy 02/23/2013  . Abnormality of gait 02/23/2013  . COPD exacerbation 01/31/2013  . Fracture of right distal radius 12/14/2012  . Diabetes 11/30/2012  . ITP (idiopathic thrombocytopenic purpura) 09/07/2012  . LLQ pain 11/13/2011  . Ventral hernia 10/28/2011  . AAA (abdominal aortic aneurysm)   . Tobacco abuse   . Diarrhea 10/22/2011  . Tubular adenoma of colon   . Obstructive sleep apnea 03/27/2011  . Fasting hyperglycemia 03/27/2011  . Nephrolithiasis 03/27/2011  . Bladder carcinoma 03/27/2011  . COPD (chronic  obstructive pulmonary disease) 02/24/2011  . Adenocarcinoma of right lung 02/24/2011  . Arteriosclerotic cardiovascular disease (ASCVD) 01/06/2011  . Hypertension 01/06/2011  . CKD (chronic kidney disease) stage 3, GFR 30-59 ml/min 01/06/2011     PLAN:   I reviewed the CT scans with the patient and his wife. We will continue with ongoing CT surveillance as recommended in accordance with the NCCN guidelines.  We addressed his ongoing tobacco use and its relationship to his bladder cancer and lung cancer. He is aware of this but is not interested in discontinuing smoking.  I addressed with the patient a referral into the [facility] program. He states he has a treadmill at home that he would like to use. We discussed setting realistic goals given his general inactivity.  In regards to his chronic thrombocytopenia, counts are stable.  Follow up in 6 months  All questions were answered. The patient knows to call the clinic with any problems, questions or concerns. We can certainly see the patient much sooner  if necessary.  This document serves as a record of services personally performed by Ancil Linsey, MD. It was created on her behalf by Janace Hoard, a trained medical scribe. The creation of this record is based on the scribe's personal observations and the provider's statements to them. This document has been checked and approved by the attending provider.  I have reviewed the above documentation for accuracy and completeness, and I agree with the above. This note was electronically signed.  Brynlie Daza KristenMD 12/29/2014

## 2014-12-28 NOTE — Patient Instructions (Signed)
..  [place] [facility] Discharge Instructions  RECOMMENDATIONS MADE BY THE CONSULTANT AND ANY TEST RESULTS WILL BE SENT TO YOUR REFERRING PHYSICIAN. Return in six months as scheduled.  Please see Amy for your appointments before you leave today.    Thank you for choosing [place] at [facility] to provide your oncology and hematology care.  To afford each patient quality time with our provider, please arrive at least 15 minutes before your scheduled appointment time.    You need to re-schedule your appointment should you arrive 10 or more minutes late.  We strive to give you quality time with our providers, and arriving late affects you and other patients whose appointments are after yours.  Also, if you no show three or more times for appointments you may be dismissed from the clinic at the providers discretion.     Again, thank you for choosing [facility].  Our hope is that these requests will decrease the amount of time that you wait before being seen by our physicians.       _____________________________________________________________  Should you have questions after your visit to [facility], please contact our office at [phone] between the hours of 8:30 a.m. and 4:30 p.m.  Voicemails left after 4:30 p.m. will not be returned until the following business day.  For prescription refill requests, have your pharmacy contact our office.

## 2014-12-29 ENCOUNTER — Encounter (HOSPITAL_COMMUNITY): Payer: Self-pay | Admitting: Hematology & Oncology

## 2015-01-05 ENCOUNTER — Other Ambulatory Visit: Payer: Self-pay

## 2015-01-05 NOTE — Patient Outreach (Signed)
[facility]) Care Management  01/05/2015  Kyle [name] 1934/05/03 [id number]   Telephone call to patient for monthly disease management follow up.  Unable to reach.  HIPPA compliant voice message left.   Plan: RN Health Coach will attempt telephone outreach within 1 week.    [name], RN, MSN [place] [phone]

## 2015-01-05 NOTE — Patient Outreach (Signed)
[place] [facility]) Care Management  [place]  01/05/2015   Kyle [name] [birth year] [id number]  Incoming call from patient he reports he is doing good. Saw Oncologist since last conversation and visit went well. Next visit in 6 months.  Patient reports blood sugars less than 150.   Checking sugars at least once every 7-10 days.  Encouraged patient to check at least weekly.  He verbalized understanding.  Patient denies pain.  Patient reports up and about regularly walking trying to stay active.  Discussed with patient about placing him on surveillance every 2-3 months. Patient agreeable to this.  Patient reminded to feel free to call Health Coach with any questions or concerns.  Patient verbalized understanding.  No concerns voiced on call.     Current Medications:  Current Outpatient Prescriptions  Medication Sig Dispense Refill  . ACCU-CHEK AVIVA PLUS test strip USE AS DIRECTED TO TEST ONCE DAILY. 50 each 6  . acetaminophen (TYLENOL) 500 MG tablet Take 500 mg by mouth every 6 (six) hours as needed for mild pain.     Marland Kitchen albuterol (PROAIR HFA) 108 (90 BASE) MCG/ACT inhaler Inhale 2 puffs into the lungs every 6 (six) hours as needed for wheezing. 1 Inhaler 5  . ALPRAZolam (XANAX) 0.5 MG tablet Take 1 tablet (0.5 mg total) by mouth 2 (two) times daily as needed for anxiety. 60 tablet 5  . aspirin EC 81 MG tablet Take 81 mg by mouth daily.     . cetirizine (ZYRTEC) 10 MG tablet Take 1 tablet (10 mg total) by mouth daily. 30 tablet 5  . cholecalciferol (VITAMIN D) 1000 UNITS tablet Take 1,000 Units by mouth daily. Vit D monthly supplementation stopped by Dr. Shon Millet in February. Changed to Vit D 1000iu qd po.    . citalopram (CELEXA) 20 MG tablet Take 1 tablet (20 mg total) by mouth daily. 90 tablet 1  . ferrous sulfate (FERROUSUL) 325 (65 FE) MG tablet Take 325 mg by mouth daily with breakfast.      . fluticasone (FLONASE) 50 MCG/ACT nasal spray Place 2 sprays into both  nostrils daily as needed for allergies. 16 g 5  . ipratropium (ATROVENT) 0.06 % nasal spray Place 2 sprays into both nostrils 4 (four) times daily. 15 mL 5  . lisinopril (PRINIVIL,ZESTRIL) 5 MG tablet Take 1 tablet (5 mg total) by mouth daily. 90 tablet 1  . metoprolol (LOPRESSOR) 50 MG tablet Take 1 tablet (50 mg total) by mouth 2 (two) times daily. 180 tablet 1  . niacin (NIASPAN) 500 MG CR tablet Take 2 tablets (1,000 mg total) by mouth at bedtime. 60 tablet 5  . nitroGLYCERIN (NITROSTAT) 0.4 MG SL tablet Place 1 tablet (0.4 mg total) under the tongue as needed. Place 0.4 mg under the tongue every 5 (five) minutes as needed. Call MD if need more than 2 3 tablet 5  . pravastatin (PRAVACHOL) 80 MG tablet Take 1 tablet (80 mg total) by mouth daily. 90 tablet 1  . tiotropium (SPIRIVA HANDIHALER) 18 MCG inhalation capsule INHALE ONE DOSE BY MOUTH ONCE DAILY 90 capsule 5  . [DISCONTINUED] budesonide-formoterol (SYMBICORT) 160-4.5 MCG/ACT inhaler Inhale 2 puffs into the lungs 2 (two) times daily.       No current facility-administered medications for this visit.    Functional Status:  In your present state of health, do you have any difficulty performing the following activities: 01/05/2015 12/08/2014  Hearing? [name]  Vision? Y N  Difficulty concentrating or  making decisions? Y N  Walking or climbing stairs? Y Y  Dressing or bathing? Y N  Doing errands, shopping? N N  Preparing Food and eating ? N N  Using the Toilet? N N  In the past six months, have you accidently leaked urine? N N  Do you have problems with loss of bowel control? N N  Managing your Medications? N N  Managing your Finances? N N  Housekeeping or managing your Housekeeping? N N    Fall/Depression Screening: PHQ 2/9 Scores 01/05/2015 12/08/2014 11/13/2014 06/28/2014 01/10/2014  PHQ - 2 Score 0 0 0 0 1   THN CM Care Plan Problem One        Patient Outreach Telephone from 01/05/2015 in [place] Problem  One  Diabetes Disease Management needs   THN CM Short Term Goal #1 (0-30 days)  patient will resume checking cbg's at least once weekly over the next 30 days as evidenced by patient documentation and monitor readings   THN CM Short Term Goal #1 Start Date  01/05/15   Interventions for Short Term Goal #1  Re-emphasized patient check cbg's at least once weekly,  advised that he choose a particular day or days to check cbg's   THN CM Short Term Goal #2 (0-30 days)  patient will walk 10 minutes, 3 times weekly over the next 30 days   THN CM Short Term Goal #2 Start Date  10/17/14   [facility] CM Short Term Goal #2 Met Date  12/08/14   Interventions for Short Term Goal #2  discussed patient driven goal for exercise program   THN CM Short Term Goal #3 (0-30 days)  patient will work to improve dietary balance over the next 30 days as evidenced by patient documentation and report of same   [facility] CM Short Term Goal #3 Met Date  12/08/14   Interventions for Short Tern Goal #3  discussed 50/25/25 plate method utilizing teachback method       Assessment: Patient will continue to benefit from health coach calls at least every 2-3 months for diabetes management and support for self management.  Plan: RN Health Coach will contact patient within the next 2 months for outreach and patient agrees to next outreach.  RN Health Coach will send update to primary doctor.     [name], RN, MSN [place] [phone]

## 2015-01-11 ENCOUNTER — Ambulatory Visit: Payer: Medicare Other

## 2015-01-18 DIAGNOSIS — J449 Chronic obstructive pulmonary disease, unspecified: Secondary | ICD-10-CM | POA: Diagnosis not present

## 2015-01-26 DIAGNOSIS — R399 Unspecified symptoms and signs involving the genitourinary system: Secondary | ICD-10-CM | POA: Diagnosis not present

## 2015-01-26 DIAGNOSIS — R8290 Unspecified abnormal findings in urine: Secondary | ICD-10-CM | POA: Diagnosis not present

## 2015-01-26 DIAGNOSIS — C679 Malignant neoplasm of bladder, unspecified: Secondary | ICD-10-CM | POA: Diagnosis not present

## 2015-02-05 ENCOUNTER — Encounter: Payer: Self-pay | Admitting: Internal Medicine

## 2015-02-05 DIAGNOSIS — C672 Malignant neoplasm of lateral wall of bladder: Secondary | ICD-10-CM | POA: Diagnosis not present

## 2015-02-14 ENCOUNTER — Ambulatory Visit (INDEPENDENT_AMBULATORY_CARE_PROVIDER_SITE_OTHER): Payer: Medicare Other | Admitting: Family Medicine

## 2015-02-14 ENCOUNTER — Encounter: Payer: Self-pay | Admitting: Family Medicine

## 2015-02-14 VITALS — BP 126/82 | Ht 72.0 in | Wt 272.0 lb

## 2015-02-14 DIAGNOSIS — E785 Hyperlipidemia, unspecified: Secondary | ICD-10-CM

## 2015-02-14 DIAGNOSIS — I10 Essential (primary) hypertension: Secondary | ICD-10-CM | POA: Diagnosis not present

## 2015-02-14 DIAGNOSIS — E119 Type 2 diabetes mellitus without complications: Secondary | ICD-10-CM

## 2015-02-14 LAB — POCT GLYCOSYLATED HEMOGLOBIN (HGB A1C): HEMOGLOBIN A1C: 5.8

## 2015-02-14 NOTE — Progress Notes (Signed)
   Subjective:    Patient ID: Kyle [name], male    DOB: [birth year], 81 y.o.   MRN: [id number]  Diabetes He presents for his follow-up diabetic visit. He has type 2 diabetes mellitus. Risk factors for coronary artery disease include hypertension, dyslipidemia and obesity. Current diabetic treatment includes diet. He is compliant with treatment all of the time. His weight is stable. He is following a diabetic diet. He has not had a previous visit with a dietitian. He does not see a podiatrist.Eye exam current: appt 02/15/15.   Patient reports being dizzy at times- thinks it is his sinuses. Pt felt dizzy and had not had a spell since then , head feel s puffy  Sugars  Results for orders placed or performed in visit on 02/14/15  POCT glycosylated hemoglobin (Hb A1C)  Result Value Ref Range   Hemoglobin A1C 5.8      No low sugar spells, lowest 104 and 103  Good control    Compliant with cholesterol medication. No obvious side effects. Has cut down fat intake. Generally does not miss a dose.    Review of Systems No headache no chest pain chronic shortness of breath no nausea no vomiting no change in bowel habits    Objective:   Physical Exam  Alert vitals stable. HEENT normal. Blood pressure decent on repeat. Lungs no crackles no wheezes heart rare rhythm ankles without edema      Assessment & Plan:  Impression 1 type 2 diabetes controlled good discuss #2 hyperlipidemia exact control uncertain. #3 sinusitis symptoms not enough to warrant and I biotics discussed plan appropriate blood work. Maintain same medications. Diet exercise discussed. WSL

## 2015-02-15 DIAGNOSIS — H2513 Age-related nuclear cataract, bilateral: Secondary | ICD-10-CM | POA: Diagnosis not present

## 2015-02-15 DIAGNOSIS — E119 Type 2 diabetes mellitus without complications: Secondary | ICD-10-CM | POA: Diagnosis not present

## 2015-02-15 LAB — MICROALBUMIN, URINE: MICROALBUM., U, RANDOM: 836.4 ug/mL

## 2015-02-15 LAB — HM DIABETES EYE EXAM

## 2015-02-15 LAB — LIPID PANEL
Chol/HDL Ratio: 5.3 ratio units — ABNORMAL HIGH (ref 0.0–5.0)
Cholesterol, Total: 169 mg/dL (ref 100–199)
HDL: 32 mg/dL — AB (ref >39)
LDL CALC: 87 mg/dL (ref 0–99)
TRIGLYCERIDES: 248 mg/dL — AB (ref 0–149)
VLDL CHOLESTEROL CAL: 50 mg/dL — AB (ref 5–40)

## 2015-02-17 DIAGNOSIS — J449 Chronic obstructive pulmonary disease, unspecified: Secondary | ICD-10-CM | POA: Diagnosis not present

## 2015-02-19 DIAGNOSIS — A499 Bacterial infection, unspecified: Secondary | ICD-10-CM | POA: Diagnosis not present

## 2015-02-19 DIAGNOSIS — C672 Malignant neoplasm of lateral wall of bladder: Secondary | ICD-10-CM | POA: Diagnosis not present

## 2015-02-19 DIAGNOSIS — N390 Urinary tract infection, site not specified: Secondary | ICD-10-CM | POA: Diagnosis not present

## 2015-02-20 ENCOUNTER — Encounter: Payer: Self-pay | Admitting: Family Medicine

## 2015-03-01 DIAGNOSIS — Z85118 Personal history of other malignant neoplasm of bronchus and lung: Secondary | ICD-10-CM | POA: Diagnosis not present

## 2015-03-01 DIAGNOSIS — N2889 Other specified disorders of kidney and ureter: Secondary | ICD-10-CM | POA: Diagnosis not present

## 2015-03-01 DIAGNOSIS — J449 Chronic obstructive pulmonary disease, unspecified: Secondary | ICD-10-CM | POA: Diagnosis not present

## 2015-03-01 DIAGNOSIS — E1165 Type 2 diabetes mellitus with hyperglycemia: Secondary | ICD-10-CM | POA: Diagnosis not present

## 2015-03-01 DIAGNOSIS — N183 Chronic kidney disease, stage 3 (moderate): Secondary | ICD-10-CM | POA: Diagnosis not present

## 2015-03-01 DIAGNOSIS — N200 Calculus of kidney: Secondary | ICD-10-CM | POA: Diagnosis not present

## 2015-03-01 DIAGNOSIS — I129 Hypertensive chronic kidney disease with stage 1 through stage 4 chronic kidney disease, or unspecified chronic kidney disease: Secondary | ICD-10-CM | POA: Diagnosis not present

## 2015-03-01 DIAGNOSIS — E559 Vitamin D deficiency, unspecified: Secondary | ICD-10-CM | POA: Diagnosis not present

## 2015-03-01 DIAGNOSIS — I151 Hypertension secondary to other renal disorders: Secondary | ICD-10-CM | POA: Diagnosis not present

## 2015-03-01 DIAGNOSIS — G4733 Obstructive sleep apnea (adult) (pediatric): Secondary | ICD-10-CM | POA: Diagnosis not present

## 2015-03-01 DIAGNOSIS — I714 Abdominal aortic aneurysm, without rupture: Secondary | ICD-10-CM | POA: Diagnosis not present

## 2015-03-01 DIAGNOSIS — M899 Disorder of bone, unspecified: Secondary | ICD-10-CM | POA: Diagnosis not present

## 2015-03-01 DIAGNOSIS — N281 Cyst of kidney, acquired: Secondary | ICD-10-CM | POA: Diagnosis not present

## 2015-03-01 DIAGNOSIS — N390 Urinary tract infection, site not specified: Secondary | ICD-10-CM | POA: Diagnosis not present

## 2015-03-01 DIAGNOSIS — R609 Edema, unspecified: Secondary | ICD-10-CM | POA: Diagnosis not present

## 2015-03-01 DIAGNOSIS — C679 Malignant neoplasm of bladder, unspecified: Secondary | ICD-10-CM | POA: Diagnosis not present

## 2015-03-01 DIAGNOSIS — E785 Hyperlipidemia, unspecified: Secondary | ICD-10-CM | POA: Diagnosis not present

## 2015-03-01 DIAGNOSIS — Z79899 Other long term (current) drug therapy: Secondary | ICD-10-CM | POA: Diagnosis not present

## 2015-03-05 ENCOUNTER — Other Ambulatory Visit: Payer: Self-pay

## 2015-03-05 ENCOUNTER — Ambulatory Visit: Payer: Medicare Other

## 2015-03-05 NOTE — Patient Outreach (Signed)
[facility]) Care Management  [place]  03/05/2015   Kyle [name] Mar 13, 1934 [id number]  Telephone call to patient for outreach call.  Patient reports he is doing good.  He reports that he is getting over a UTI.  He states he took his last pill today. Patient denies any further problems with urinary incontinence since being treated fir UTI.  Patient reports he saw PCP last month for routine visit and his A1c was 5.8.  Patient denies any problems with his COPD right now.  Coached on continuing diabetic diet and seeing doctor routinely.  He verbalized understanding.  No concerns voiced by patient.    Current Medications:  Current Outpatient Prescriptions  Medication Sig Dispense Refill  . ACCU-CHEK AVIVA PLUS test strip USE AS DIRECTED TO TEST ONCE DAILY. 50 each 6  . acetaminophen (TYLENOL) 500 MG tablet Take 500 mg by mouth every 6 (six) hours as needed for mild pain.     Marland Kitchen albuterol (PROAIR HFA) 108 (90 BASE) MCG/ACT inhaler Inhale 2 puffs into the lungs every 6 (six) hours as needed for wheezing. 1 Inhaler 5  . ALPRAZolam (XANAX) 0.5 MG tablet Take 1 tablet (0.5 mg total) by mouth 2 (two) times daily as needed for anxiety. 60 tablet 5  . aspirin EC 81 MG tablet Take 81 mg by mouth daily.     . cetirizine (ZYRTEC) 10 MG tablet Take 1 tablet (10 mg total) by mouth daily. 30 tablet 5  . cholecalciferol (VITAMIN D) 1000 UNITS tablet Take 1,000 Units by mouth daily. Vit D monthly supplementation stopped by Dr. Shon Millet in February. Changed to Vit D 1000iu qd po.    . citalopram (CELEXA) 20 MG tablet Take 1 tablet (20 mg total) by mouth daily. 90 tablet 1  . ferrous sulfate (FERROUSUL) 325 (65 FE) MG tablet Take 325 mg by mouth daily with breakfast.      . fluticasone (FLONASE) 50 MCG/ACT nasal spray Place 2 sprays into both nostrils daily as needed for allergies. 16 g 5  . ipratropium (ATROVENT) 0.06 % nasal spray Place 2 sprays into both nostrils 4 (four) times daily.  15 mL 5  . lisinopril (PRINIVIL,ZESTRIL) 5 MG tablet Take 1 tablet (5 mg total) by mouth daily. 90 tablet 1  . metoprolol (LOPRESSOR) 50 MG tablet Take 1 tablet (50 mg total) by mouth 2 (two) times daily. 180 tablet 1  . niacin (NIASPAN) 500 MG CR tablet Take 2 tablets (1,000 mg total) by mouth at bedtime. 60 tablet 5  . nitroGLYCERIN (NITROSTAT) 0.4 MG SL tablet Place 1 tablet (0.4 mg total) under the tongue as needed. Place 0.4 mg under the tongue every 5 (five) minutes as needed. Call MD if need more than 2 3 tablet 5  . pravastatin (PRAVACHOL) 80 MG tablet Take 1 tablet (80 mg total) by mouth daily. 90 tablet 1  . tiotropium (SPIRIVA HANDIHALER) 18 MCG inhalation capsule INHALE ONE DOSE BY MOUTH ONCE DAILY 90 capsule 5  . [DISCONTINUED] budesonide-formoterol (SYMBICORT) 160-4.5 MCG/ACT inhaler Inhale 2 puffs into the lungs 2 (two) times daily.       No current facility-administered medications for this visit.    Functional Status:  In your present state of health, do you have any difficulty performing the following activities: 03/05/2015 01/05/2015  Hearing? [name]  Vision? Y Y  Difficulty concentrating or making decisions? [name]  Walking or climbing stairs? Y Y  Dressing or bathing? N Y  Doing errands,  shopping? N N  Preparing Food and eating ? N N  Using the Toilet? N N  In the past six months, have you accidently leaked urine? N N  Do you have problems with loss of bowel control? N N  Managing your Medications? N N  Managing your Finances? N N  Housekeeping or managing your Housekeeping? N N    Fall/Depression Screening: PHQ 2/9 Scores 03/05/2015 01/05/2015 12/08/2014 11/13/2014 06/28/2014 01/10/2014  PHQ - 2 Score 0 0 0 0 0 1   THN CM Care Plan Problem One        Patient Outreach Telephone from 03/05/2015 in [place] Problem One  Diabetes Disease Management needs   THN CM Short Term Goal #1 (0-30 days)  patient will resume checking cbg's at least once weekly  over the next 30 days as evidenced by patient documentation and monitor readings   THN CM Short Term Goal #1 Start Date  03/05/15 [goal continued]   Interventions for Short Term Goal #1  Discussed with patient to check cbg's at least once weekly,  advised that he choose a particular day or days to check cbg's   THN CM Short Term Goal #2 (0-30 days)  patient will walk 10 minutes, 3 times weekly over the next 30 days   THN CM Short Term Goal #2 Start Date  10/17/14   [facility] CM Short Term Goal #2 Met Date  12/08/14   Interventions for Short Term Goal #2  discussed patient driven goal for exercise program   THN CM Short Term Goal #3 (0-30 days)  patient will continue to work to improve dietary balance by watching carbohydrates and sugars within the next 30 days.   THN CM Short Term Goal #3 Start Date  03/05/15   Interventions for Short Tern Goal #3  Discussed with patient foods loaded with carbohydrates,  Discussed with patient limiting desserts and drinks that contain sugar.          Assessment: Patient continues to benefit from health coach outreach for disease management.    Plan: RN Health Coach will contact patient in October for outreach and patient agrees to next outreach.   Kyle Baseman, RN, MSN [place] [phone]

## 2015-03-19 ENCOUNTER — Ambulatory Visit (INDEPENDENT_AMBULATORY_CARE_PROVIDER_SITE_OTHER): Payer: Medicare Other | Admitting: Pulmonary Disease

## 2015-03-19 ENCOUNTER — Encounter: Payer: Self-pay | Admitting: Pulmonary Disease

## 2015-03-19 ENCOUNTER — Ambulatory Visit: Payer: Medicare Other | Admitting: Pulmonary Disease

## 2015-03-19 VITALS — BP 124/68 | HR 73 | Ht 72.0 in | Wt 274.0 lb

## 2015-03-19 DIAGNOSIS — J438 Other emphysema: Secondary | ICD-10-CM

## 2015-03-19 DIAGNOSIS — C3491 Malignant neoplasm of unspecified part of right bronchus or lung: Secondary | ICD-10-CM | POA: Diagnosis not present

## 2015-03-19 NOTE — Assessment & Plan Note (Signed)
He had mild airflow obstruction in 2012, his symptoms are mild so he is a Gold grade a. He is deconditioned and needs to exercise regularly, this was stressed today.  Plan: Exercise regularly Continue Spiriva daily Follow-up 6 months or sooner if needed

## 2015-03-19 NOTE — Progress Notes (Signed)
Subjective:    Patient ID: Kyle [name], male    DOB: 14-Jan-1934, 81 y.o.   MRN: [id number]  Synopsis:  Former patient of Dr. Gwenette Greet with COPD PFT's 02/2011:  FEV1 2.40 (83%), ratio 60, nl TLC, DLCO 59%; treated with nocturnal oxygen. +response to spiriva alone >> not frequent exacerbator Trial of stiolto 2015 >> no change in breathing.  Participated in pulmonary rehab, then quit going.   Kyle [name] also had Lung cancer (Stage IA adeno, resected 2012)  HPI Chief Complaint  Patient presents with  . Follow-up    former [facility] pt. Reports breathing has been fair. no wheezing, no chest tx. Only coughs when he goes from the heat into a cold setting.     Kyle [name] has been OK when staying out of the hot weather.  He has a lot of sinus congestion right now which he thinks is alergies.  He takes zyrtec, but no nose sprays.   He has not had to be treated for bronchitis for a flare of COPD since the last visit. He continues to take Spiriva daily, he says it costs too much. He is not exercising much routinely.    Past Medical History  Diagnosis Date  . Arteriosclerotic cardiovascular disease (ASCVD) 1973, 12/2010    S/P NSTEMI secondary to distal RCA/PL lesion, tx medically.  EF of  55%-60% per  echo.  . Diabetes mellitus     Type II  . Thrombocytopenia   . COPD (chronic obstructive pulmonary disease)   . AAA (abdominal aortic aneurysm) 2004    s/p repair 2004; 4.3 cm infrarenal in 05/2011  . Tobacco abuse     50-pack-year consumption; quit in 12/2010  . Hyperlipidemia   . OSA (obstructive sleep apnea)   . Hypertension   . Benign prostatic hypertrophy     s/p transurethral resection of the prostate  . Obesity   . Tubular adenoma of colon   . Cataract   . Ventral hernia   . Diverticulosis   . Bilateral renal masses     Cystic, more prominent on CT in 12/2010 than 2007; followed by Dr. Rosana Hoes  . ITP (idiopathic thrombocytopenic purpura) 09/07/2012    Chronic ITP of adults versus  medication-induced ITP.  Stable  . Insomnia   . Bladder cancer 1996    Transurethral resection of the bladder + chemotherapy/BCG as premed  . Adenocarcinoma, lung 01/2011    transthoracic FNA; resection of the superior segment of the RLL in 03/2011; negative nodes; no chemotherapy nor radiation planned  . Chronic kidney disease     Creatinine 1.4 on discharge 12/20/2100; proteinuria; normal renal ultrasound in 2010; recent creatinines of 1.7-2.; Bilateral cystic renal masses by CT in 2011  . Nephrolithiasis 2012    ARF in 01/2011 due to obstructing nephrolithiasis  . Arm fracture     right arm  . Essential and other specified forms of tremor 02/23/2013  . Diplopia 02/23/2013  . Polyneuropathy in diabetes(357.2) 02/23/2013  . Abnormality of gait 02/23/2013  . Hx of Clostridium difficile infection   . Adenocarcinoma of right lung 02/24/2011    Ct A/P 2012:  2cm lung mass RLL PET 5638:  Hypermetabolic RLL mass, no other hypermetabolic areas. TTNA 02/2011:  Adenocarcinoma, markers c/w lung origin Right lower lobe superior segmentectomy. 04/01/2011 Dr. Arlyce Dice   . Coronary artery disease   . Myocardial infarction   . Cough     thick phlegm      Review of Systems  Objective:   Physical Exam Filed Vitals:   03/19/15 1009  BP: 124/68  Pulse: 73  Height: 6' (1.829 m)  Weight: 274 lb (124.286 kg)  SpO2: 96%   Gen: chronically ill appearing HENT: OP clear,  neck supple PULM: CTA B, normal percussion CV: RRR, no mgr, trace edema GI: BS+, soft, nontender Derm: no cyanosis or rash Psyche: normal mood and affect        Assessment & Plan:  COPD (chronic obstructive pulmonary disease) He had mild airflow obstruction in 2012, his symptoms are mild so he is a Gold grade a. He is deconditioned and needs to exercise regularly, this was stressed today.  Plan: Exercise regularly Continue Spiriva daily Follow-up 6 months or sooner if needed  Adenocarcinoma of right lung So far he has had a  stable interval since his resection in 2012. He will continue to follow-up with oncology in [place].    Current outpatient prescriptions:  .  ACCU-CHEK AVIVA PLUS test strip, USE AS DIRECTED TO TEST ONCE DAILY., Disp: 50 each, Rfl: 6 .  acetaminophen (TYLENOL) 500 MG tablet, Take 500 mg by mouth every 6 (six) hours as needed for mild pain. , Disp: , Rfl:  .  albuterol (PROAIR HFA) 108 (90 BASE) MCG/ACT inhaler, Inhale 2 puffs into the lungs every 6 (six) hours as needed for wheezing., Disp: 1 Inhaler, Rfl: 5 .  ALPRAZolam (XANAX) 0.5 MG tablet, Take 1 tablet (0.5 mg total) by mouth 2 (two) times daily as needed for anxiety., Disp: 60 tablet, Rfl: 5 .  aspirin EC 81 MG tablet, Take 81 mg by mouth daily. , Disp: , Rfl:  .  calcitRIOL (ROCALTROL) 0.25 MCG capsule, 3 times a week M,W,F, Disp: , Rfl:  .  cetirizine (ZYRTEC) 10 MG tablet, Take 1 tablet (10 mg total) by mouth daily., Disp: 30 tablet, Rfl: 5 .  citalopram (CELEXA) 20 MG tablet, Take 1 tablet (20 mg total) by mouth daily., Disp: 90 tablet, Rfl: 1 .  ferrous sulfate (FERROUSUL) 325 (65 FE) MG tablet, Take 325 mg by mouth daily with breakfast.  , Disp: , Rfl:  .  fluticasone (FLONASE) 50 MCG/ACT nasal spray, Place 2 sprays into both nostrils daily as needed for allergies., Disp: 16 g, Rfl: 5 .  ipratropium (ATROVENT) 0.06 % nasal spray, Place 2 sprays into both nostrils 4 (four) times daily. (Patient taking differently: Place 2 sprays into both nostrils as needed. ), Disp: 15 mL, Rfl: 5 .  lisinopril (PRINIVIL,ZESTRIL) 5 MG tablet, Take 1 tablet (5 mg total) by mouth daily., Disp: 90 tablet, Rfl: 1 .  metoprolol (LOPRESSOR) 50 MG tablet, Take 1 tablet (50 mg total) by mouth 2 (two) times daily., Disp: 180 tablet, Rfl: 1 .  niacin (NIASPAN) 500 MG CR tablet, Take 2 tablets (1,000 mg total) by mouth at bedtime., Disp: 60 tablet, Rfl: 5 .  nitroGLYCERIN (NITROSTAT) 0.4 MG SL tablet, Place 1 tablet (0.4 mg total) under the tongue as needed.  Place 0.4 mg under the tongue every 5 (five) minutes as needed. Call MD if need more than 2, Disp: 3 tablet, Rfl: 5 .  pravastatin (PRAVACHOL) 80 MG tablet, Take 1 tablet (80 mg total) by mouth daily., Disp: 90 tablet, Rfl: 1 .  tiotropium (SPIRIVA HANDIHALER) 18 MCG inhalation capsule, INHALE ONE DOSE BY MOUTH ONCE DAILY, Disp: 90 capsule, Rfl: 5 .  [DISCONTINUED] budesonide-formoterol (SYMBICORT) 160-4.5 MCG/ACT inhaler, Inhale 2 puffs into the lungs 2 (two) times daily.  , Disp: ,  Rfl:

## 2015-03-19 NOTE — Patient Instructions (Signed)
For your sinuses: Use [facility] Med rinses with distilled water at least twice per day using the instructions on the package. 1/2 hour after using the [facility] Med rinse, use Nasacort two puffs in each nostril once per day.  Remember that the Nasacort can take 1-2 weeks to work after regular use. Use generic zyrtec (cetirizine) every day.  If this doesn't help, then stop taking it and use chlorpheniramine-phenylephrine combination tablets.   Exercise regularly Keep taking your inhalers as you're doing Get a flu shot in the fall We'll see you back in 6 months or sooner if needed

## 2015-03-19 NOTE — Assessment & Plan Note (Signed)
So far he has had a stable interval since his resection in 2012. He will continue to follow-up with oncology in [place].

## 2015-03-20 DIAGNOSIS — J449 Chronic obstructive pulmonary disease, unspecified: Secondary | ICD-10-CM | POA: Diagnosis not present

## 2015-03-27 ENCOUNTER — Encounter: Payer: Self-pay | Admitting: *Deleted

## 2015-04-02 DIAGNOSIS — E1140 Type 2 diabetes mellitus with diabetic neuropathy, unspecified: Secondary | ICD-10-CM | POA: Diagnosis not present

## 2015-04-02 DIAGNOSIS — E1151 Type 2 diabetes mellitus with diabetic peripheral angiopathy without gangrene: Secondary | ICD-10-CM | POA: Diagnosis not present

## 2015-04-02 DIAGNOSIS — B351 Tinea unguium: Secondary | ICD-10-CM | POA: Diagnosis not present

## 2015-04-02 DIAGNOSIS — L600 Ingrowing nail: Secondary | ICD-10-CM | POA: Diagnosis not present

## 2015-04-11 ENCOUNTER — Other Ambulatory Visit: Payer: Self-pay | Admitting: Dermatology

## 2015-04-11 ENCOUNTER — Encounter: Payer: Self-pay | Admitting: Gastroenterology

## 2015-04-11 ENCOUNTER — Ambulatory Visit (INDEPENDENT_AMBULATORY_CARE_PROVIDER_SITE_OTHER): Payer: Medicare Other | Admitting: Gastroenterology

## 2015-04-11 VITALS — BP 126/73 | HR 65 | Temp 98.2°F | Ht 71.0 in | Wt 273.0 lb

## 2015-04-11 DIAGNOSIS — D485 Neoplasm of uncertain behavior of skin: Secondary | ICD-10-CM | POA: Diagnosis not present

## 2015-04-11 DIAGNOSIS — L570 Actinic keratosis: Secondary | ICD-10-CM | POA: Diagnosis not present

## 2015-04-11 DIAGNOSIS — D692 Other nonthrombocytopenic purpura: Secondary | ICD-10-CM | POA: Diagnosis not present

## 2015-04-11 DIAGNOSIS — C44622 Squamous cell carcinoma of skin of right upper limb, including shoulder: Secondary | ICD-10-CM | POA: Diagnosis not present

## 2015-04-11 DIAGNOSIS — D126 Benign neoplasm of colon, unspecified: Secondary | ICD-10-CM

## 2015-04-11 NOTE — Progress Notes (Signed)
Primary Care Physician:  Mickie Hillier, MD Primary Gastroenterologist:  Dr. Gala Romney   Chief Complaint  Patient presents with  . Colonoscopy    HPI:   Kyle [name] is a 81 y.o. male presenting today for an office visit prior to surveillance colonoscopy. He has a history of colonic adenoma, due for surveillance now. Actually had flex sig in 2014 by Dr. Delfin Edis with tubular adenoma noted and negative segmental biopsies. History of Cdiff in 2014.   Diarrhea resolved and able to control getting to the bathroom. He has had issues with incontinence in the past. No abdominal pain. No N/V. No reflux exacerbation. No dysphagia.     Past Medical History  Diagnosis Date  . Arteriosclerotic cardiovascular disease (ASCVD) 1973, 12/2010    S/P NSTEMI secondary to distal RCA/PL lesion, tx medically.  EF of  55%-60% per  echo.  . Diabetes mellitus     Type II  . Thrombocytopenia   . COPD (chronic obstructive pulmonary disease)   . AAA (abdominal aortic aneurysm) 2004    s/p repair 2004; 4.3 cm infrarenal in 05/2011  . Tobacco abuse     50-pack-year consumption; quit in 12/2010  . Hyperlipidemia   . OSA (obstructive sleep apnea)   . Hypertension   . Benign prostatic hypertrophy     s/p transurethral resection of the prostate  . Obesity   . Tubular adenoma of colon   . Cataract   . Ventral hernia   . Diverticulosis   . Bilateral renal masses     Cystic, more prominent on CT in 12/2010 than 2007; followed by Dr. Rosana Hoes  . ITP (idiopathic thrombocytopenic purpura) 09/07/2012    Chronic ITP of adults versus medication-induced ITP.  Stable  . Insomnia   . Bladder cancer 1996    Transurethral resection of the bladder + chemotherapy/BCG as premed  . Adenocarcinoma, lung 01/2011    transthoracic FNA; resection of the superior segment of the RLL in 03/2011; negative nodes; no chemotherapy nor radiation planned  . Chronic kidney disease     Creatinine 1.4 on discharge 12/20/2100; proteinuria;  normal renal ultrasound in 2010; recent creatinines of 1.7-2.; Bilateral cystic renal masses by CT in 2011  . Nephrolithiasis 2012    ARF in 01/2011 due to obstructing nephrolithiasis  . Arm fracture     right arm  . Essential and other specified forms of tremor 02/23/2013  . Diplopia 02/23/2013  . Polyneuropathy in diabetes(357.2) 02/23/2013  . Abnormality of gait 02/23/2013  . Hx of Clostridium difficile infection   . Adenocarcinoma of right lung 02/24/2011    Ct A/P 2012:  2cm lung mass RLL PET 3267:  Hypermetabolic RLL mass, no other hypermetabolic areas. TTNA 02/2011:  Adenocarcinoma, markers c/w lung origin Right lower lobe superior segmentectomy. 04/01/2011 Dr. Arlyce Dice   . Coronary artery disease   . Myocardial infarction   . Cough     thick phlegm    Past Surgical History  Procedure Laterality Date  . Transurethral resection of prostate    . Cystectomy    . Tonsillectomy    . Colonoscopy  03/19/2010    Dr. Gala Romney -(poor prep) Anal papilla, rectal hyperplastic polyp, tubular adenoma removed splenic flexure, left-sided diverticula  . Wedge resection  04/2011    carcinoma of lung  . Colonoscopy  11/28/2004    RMR:  Diminutive rectal and left colon polyps as described above, cold  biopsied/removed/  Left sided diverticula. The remainder of the colonic mucosa  appeared normal.  . Colonoscopy   09/15/01    RMR: Multiple diminutive polyps destroyed with dermolysis as described above/ Multiple small polyps on stalks in the colon resected with snare cautery/ Scattered pan colonic diverticulum/ The remainder of the colonic mucosa appeared normal  . Abdominal aortic aneurysm repair  2004  . Cardiac catheterization    . Lung lobectomy    . Video bronchoscopy with endobronchial navigation N/A 10/10/2013    Procedure: VIDEO BRONCHOSCOPY WITH ENDOBRONCHIAL NAVIGATION;  Surgeon: Melrose Nakayama, MD;  Location: [facility];  Service: Thoracic;  Laterality: N/A;  NO BLOOD THINNERS BUT PATIENT HAS ITP  .  Cystoscopy  04/2014  . Flexible sigmoidoscopy  2014    Dr. Olevia Perches: tubular adenoma, negative stool studies     Current Outpatient Prescriptions  Medication Sig Dispense Refill  . ACCU-CHEK AVIVA PLUS test strip USE AS DIRECTED TO TEST ONCE DAILY. 50 each 6  . acetaminophen (TYLENOL) 500 MG tablet Take 500 mg by mouth every 6 (six) hours as needed for mild pain.     Marland Kitchen albuterol (PROAIR HFA) 108 (90 BASE) MCG/ACT inhaler Inhale 2 puffs into the lungs every 6 (six) hours as needed for wheezing. 1 Inhaler 5  . ALPRAZolam (XANAX) 0.5 MG tablet Take 1 tablet (0.5 mg total) by mouth 2 (two) times daily as needed for anxiety. 60 tablet 5  . aspirin EC 81 MG tablet Take 81 mg by mouth daily.     . calcitRIOL (ROCALTROL) 0.25 MCG capsule 3 times a week M,W,F    . cetirizine (ZYRTEC) 10 MG tablet Take 1 tablet (10 mg total) by mouth daily. 30 tablet 5  . citalopram (CELEXA) 20 MG tablet Take 1 tablet (20 mg total) by mouth daily. 90 tablet 1  . ferrous sulfate (FERROUSUL) 325 (65 FE) MG tablet Take 325 mg by mouth daily with breakfast.      . fluticasone (FLONASE) 50 MCG/ACT nasal spray Place 2 sprays into both nostrils daily as needed for allergies. 16 g 5  . ipratropium (ATROVENT) 0.06 % nasal spray Place 2 sprays into both nostrils 4 (four) times daily. (Patient taking differently: Place 2 sprays into both nostrils as needed. ) 15 mL 5  . lisinopril (PRINIVIL,ZESTRIL) 5 MG tablet Take 1 tablet (5 mg total) by mouth daily. 90 tablet 1  . metoprolol (LOPRESSOR) 50 MG tablet Take 1 tablet (50 mg total) by mouth 2 (two) times daily. 180 tablet 1  . niacin (NIASPAN) 500 MG CR tablet Take 2 tablets (1,000 mg total) by mouth at bedtime. 60 tablet 5  . nitroGLYCERIN (NITROSTAT) 0.4 MG SL tablet Place 1 tablet (0.4 mg total) under the tongue as needed. Place 0.4 mg under the tongue every 5 (five) minutes as needed. Call MD if need more than 2 3 tablet 5  . pravastatin (PRAVACHOL) 80 MG tablet Take 1 tablet (80  mg total) by mouth daily. 90 tablet 1  . tiotropium (SPIRIVA HANDIHALER) 18 MCG inhalation capsule INHALE ONE DOSE BY MOUTH ONCE DAILY 90 capsule 5  . [DISCONTINUED] budesonide-formoterol (SYMBICORT) 160-4.5 MCG/ACT inhaler Inhale 2 puffs into the lungs 2 (two) times daily.       No current facility-administered medications for this visit.    Allergies as of 04/11/2015 - Review Complete 04/11/2015  Allergen Reaction Noted  . Codeine Anaphylaxis 09/07/2012  . Etodolac  11/20/2012    Family History  Problem Relation Age of Onset  . Aortic aneurysm Mother   . Emphysema Father  smoker  . Clotting disorder Father   . Arthritis Father   . Hypertension Father   . Diabetes Father   . Aortic aneurysm Father   . Tremor Father   . Stroke Paternal Grandmother   . Other Paternal Grandfather     brain aneurysm  . Colon cancer Cousin     Social History   Social History  . Marital Status: Married    Spouse Name: [name]   . Number of Children: 2  . Years of Education: 12+   Occupational History  . Retired  [employer]   Social History Main Topics  . Smoking status: Former Smoker -- 1.00 packs/day for 50 years    Types: Cigarettes    Start date: 07/22/1955    Quit date: 12/19/2012  . Smokeless tobacco: Never Used  . Alcohol Use: No  . Drug Use: No  . Sexual Activity: No   Other Topics Concern  . Not on file   Social History Narrative   Patient lives at home with his wife [name].    Patient has 2 children.    Patient is retired.    Patient is right handed.    Patient has 2 years of college.     Review of Systems: Gen: Denies any fever, chills, fatigue, weight loss, lack of appetite.  CV: Denies chest pain, heart palpitations, peripheral edema, syncope.  Resp: +DOE GI: see HPI GU : UTI X 2 in past year MS: +joint pain  Derm: Denies rash, itching, dry skin Psych: Denies depression, anxiety, memory loss, and confusion Heme: Denies bruising, bleeding, and enlarged  lymph nodes.  Physical Exam: BP 126/73 mmHg  Pulse 65  Temp(Src) 98.2 F (36.8 C) (Oral)  Ht '5\' 11"'$  (1.803 m)  Wt 273 lb (123.832 kg)  BMI 38.09 kg/m2 General:   Alert and oriented. Pleasant and cooperative. Well-nourished and well-developed.  Head:  Normocephalic and atraumatic. Eyes:  Without icterus, sclera clear and conjunctiva pink.  Ears:  Normal auditory acuity. Nose:  No deformity, discharge,  or lesions. Mouth:  No deformity or lesions, oral mucosa pink.  Lungs:  Clear to auscultation bilaterally. No wheezes, rales, or rhonchi. No distress.  Heart:  S1, S2 present without murmurs appreciated.  Abdomen:  +BS, soft, obese. Large ventral hernia, easily reducible. No HSM. No TTP Rectal:  Deferred  Msk:  Symmetrical without gross deformities. Normal posture. Extremities:  Without  edema. Neurologic:  Alert and  oriented x4;  grossly normal neurologically. Skin:  Intact without significant lesions or rashes. Psych:  Alert and cooperative. Normal mood and affect.

## 2015-04-11 NOTE — Patient Instructions (Signed)
We have scheduled you for a colonoscopy with Dr. Gala Romney due to your history of polyps.   I am glad you are doing better!

## 2015-04-12 ENCOUNTER — Encounter: Payer: Self-pay | Admitting: Gastroenterology

## 2015-04-13 NOTE — Assessment & Plan Note (Signed)
81 year old male with history of multiple colon polyps, tubular adenomas, last colonoscopy in 2011. Flex sig in 2014 by Dr. Olevia Perches with tubular adenoma as well, but as it was a flex sig, needs complete visualization of entire colon.   Proceed with TCS with Dr. Gala Romney in near future: the risks, benefits, and alternatives have been discussed with the patient in detail. The patient states understanding and desires to proceed.

## 2015-04-16 NOTE — Progress Notes (Signed)
CC'D TO PCP °

## 2015-04-17 NOTE — Progress Notes (Signed)
Pt called to inform us that he is having to take care of his wife right now and he could not schedule his TCS. He would call us after she is doing better.

## 2015-04-18 ENCOUNTER — Other Ambulatory Visit: Payer: Self-pay

## 2015-04-18 DIAGNOSIS — Z86010 Personal history of colonic polyps: Secondary | ICD-10-CM

## 2015-04-18 MED ORDER — PEG 3350-KCL-NA BICARB-NACL 420 G PO SOLR: 4000.0000 mL | ORAL | Status: DC

## 2015-04-20 DIAGNOSIS — J449 Chronic obstructive pulmonary disease, unspecified: Secondary | ICD-10-CM | POA: Diagnosis not present

## 2015-04-30 ENCOUNTER — Other Ambulatory Visit: Payer: Self-pay

## 2015-04-30 ENCOUNTER — Ambulatory Visit: Payer: Self-pay

## 2015-04-30 DIAGNOSIS — N189 Chronic kidney disease, unspecified: Secondary | ICD-10-CM | POA: Diagnosis not present

## 2015-04-30 NOTE — Patient Outreach (Signed)
Kyle [name]) Care Management  [place]  04/30/2015   Kyle [name] Jan 29, 1934 [id number]  Subjective:  Telephone call to patient for outreach call.  Patient reports he is doing good.  He shares that he is going for a routine colonoscopy on tomorrow. Patient also shares he has been busy with his wife as she has stage 3 breast cancer and is under going treatment presently. Patient reports he has not had his flu but will get it on his next appointment with Dr. [name].    Diabetes: Patient reports his blood sugar is ranging from 120-127.  Patient reports continuing to watch his diet.  Discussed with patient portion control as well as watching his carbohydrates.    Objective:   Current Medications:  Current Outpatient Prescriptions  Medication Sig Dispense Refill  . acetaminophen (TYLENOL) 500 MG tablet Take 500 mg by mouth every 6 (six) hours as needed for mild pain.     Marland Kitchen albuterol (PROAIR HFA) 108 (90 BASE) MCG/ACT inhaler Inhale 2 puffs into the lungs every 6 (six) hours as needed for wheezing. 1 Inhaler 5  . ALPRAZolam (XANAX) 0.5 MG tablet Take 1 tablet (0.5 mg total) by mouth 2 (two) times daily as needed for anxiety. 60 tablet 5  . aspirin EC 81 MG tablet Take 81 mg by mouth daily.     . cetirizine (ZYRTEC) 10 MG tablet Take 1 tablet (10 mg total) by mouth daily. 30 tablet 5  . citalopram (CELEXA) 20 MG tablet Take 1 tablet (20 mg total) by mouth daily. 90 tablet 1  . ferrous sulfate (FERROUSUL) 325 (65 FE) MG tablet Take 325 mg by mouth daily with breakfast.      . fluticasone (FLONASE) 50 MCG/ACT nasal spray Place 2 sprays into both nostrils daily as needed for allergies. 16 g 5  . ipratropium (ATROVENT) 0.06 % nasal spray Place 2 sprays into both nostrils 4 (four) times daily. (Patient taking differently: Place 2 sprays into both nostrils as needed. ) 15 mL 5  . lisinopril (PRINIVIL,ZESTRIL) 5 MG tablet Take 1 tablet (5 mg total) by mouth daily. 90 tablet  1  . metoprolol (LOPRESSOR) 50 MG tablet Take 1 tablet (50 mg total) by mouth 2 (two) times daily. 180 tablet 1  . niacin (NIASPAN) 500 MG CR tablet Take 2 tablets (1,000 mg total) by mouth at bedtime. 60 tablet 5  . nitroGLYCERIN (NITROSTAT) 0.4 MG SL tablet Place 1 tablet (0.4 mg total) under the tongue as needed. Place 0.4 mg under the tongue every 5 (five) minutes as needed. Call MD if need more than 2 3 tablet 5  . polyethylene glycol-electrolytes (TRILYTE) 420 G solution Take 4,000 mLs by mouth as directed. 4000 mL 0  . pravastatin (PRAVACHOL) 80 MG tablet Take 1 tablet (80 mg total) by mouth daily. 90 tablet 1  . tiotropium (SPIRIVA HANDIHALER) 18 MCG inhalation capsule INHALE ONE DOSE BY MOUTH ONCE DAILY 90 capsule 5  . [DISCONTINUED] budesonide-formoterol (SYMBICORT) 160-4.5 MCG/ACT inhaler Inhale 2 puffs into the lungs 2 (two) times daily.       No current facility-administered medications for this visit.    Functional Status:  In your present state of health, do you have any difficulty performing the following activities: 03/05/2015 01/05/2015  Hearing? [name]  Vision? Y Y  Difficulty concentrating or making decisions? [name]  Walking or climbing stairs? Y Y  Dressing or bathing? N Y  Doing errands, shopping? N N  Preparing  Food and eating ? N N  Using the Toilet? N N  In the past six months, have you accidently leaked urine? N N  Do you have problems with loss of bowel control? N N  Managing your Medications? N N  Managing your Finances? N N  Housekeeping or managing your Housekeeping? N N    Fall/Depression Screening: PHQ 2/9 Scores 04/30/2015 03/05/2015 01/05/2015 12/08/2014 11/13/2014 06/28/2014 01/10/2014  PHQ - 2 Score 0 0 0 0 0 0 1    Assessment: Patient continues to benefit from health coach outreach for disease management.     Plan:  [facility] CM Care Plan Problem One        Most Recent Value   Care Plan Problem One  Diabetes Disease Management needs   Role Documenting the  Problem One  Health Coach   THN CM Short Term Goal #1 (0-30 days)  patient will resume checking cbg's at least once weekly over the next 30 days as evidenced by patient documentation and monitor readings   THN CM Short Term Goal #1 Start Date  04/30/15 [goal continued]   Interventions for Short Term Goal #1  Reviewed  with patient the importance of checking cbg's at least once weekly.   THN CM Short Term Goal #2 (0-30 days)  patient will walk 10 minutes, 3 times weekly over the next 30 days   THN CM Short Term Goal #2 Start Date  10/17/14   [facility] CM Short Term Goal #2 Met Date  12/08/14   Interventions for Short Term Goal #2  discussed patient driven goal for exercise program   THN CM Short Term Goal #3 (0-30 days)  patient will continue to work to improve dietary balance by watching carbohydrates and sugars within the next 30 days.   THN CM Short Term Goal #3 Start Date  03/05/15   Interventions for Short Tern Goal #3  Reviewed portion control and carbohydrate limiting with patient.       RN Health Coach will contact patient within the next two months and patient agrees to next outreach.   [name], RN, MSN [place] [phone]    .

## 2015-05-01 ENCOUNTER — Ambulatory Visit (HOSPITAL_COMMUNITY)
Admission: RE | Admit: 2015-05-01 | Discharge: 2015-05-01 | Disposition: A | Discharge status: Home / Self Care | Payer: Medicare Other | Source: Ambulatory Visit | Attending: Internal Medicine | Admitting: Internal Medicine

## 2015-05-01 ENCOUNTER — Encounter (HOSPITAL_COMMUNITY): Payer: Self-pay

## 2015-05-01 ENCOUNTER — Encounter (HOSPITAL_COMMUNITY)
Admission: RE | Disposition: A | Discharge status: Home / Self Care | Payer: Self-pay | Source: Ambulatory Visit | Attending: Internal Medicine

## 2015-05-01 DIAGNOSIS — Z85118 Personal history of other malignant neoplasm of bronchus and lung: Secondary | ICD-10-CM | POA: Insufficient documentation

## 2015-05-01 DIAGNOSIS — Z1211 Encounter for screening for malignant neoplasm of colon: Secondary | ICD-10-CM | POA: Insufficient documentation

## 2015-05-01 DIAGNOSIS — Z8551 Personal history of malignant neoplasm of bladder: Secondary | ICD-10-CM | POA: Insufficient documentation

## 2015-05-01 DIAGNOSIS — Q438 Other specified congenital malformations of intestine: Secondary | ICD-10-CM | POA: Insufficient documentation

## 2015-05-01 DIAGNOSIS — I2510 Atherosclerotic heart disease of native coronary artery without angina pectoris: Secondary | ICD-10-CM | POA: Diagnosis not present

## 2015-05-01 DIAGNOSIS — D123 Benign neoplasm of transverse colon: Secondary | ICD-10-CM | POA: Diagnosis not present

## 2015-05-01 DIAGNOSIS — Z87891 Personal history of nicotine dependence: Secondary | ICD-10-CM | POA: Insufficient documentation

## 2015-05-01 DIAGNOSIS — D696 Thrombocytopenia, unspecified: Secondary | ICD-10-CM | POA: Diagnosis not present

## 2015-05-01 DIAGNOSIS — D120 Benign neoplasm of cecum: Secondary | ICD-10-CM | POA: Insufficient documentation

## 2015-05-01 DIAGNOSIS — Z7982 Long term (current) use of aspirin: Secondary | ICD-10-CM | POA: Insufficient documentation

## 2015-05-01 DIAGNOSIS — G4733 Obstructive sleep apnea (adult) (pediatric): Secondary | ICD-10-CM | POA: Diagnosis not present

## 2015-05-01 DIAGNOSIS — J449 Chronic obstructive pulmonary disease, unspecified: Secondary | ICD-10-CM | POA: Insufficient documentation

## 2015-05-01 DIAGNOSIS — Z7951 Long term (current) use of inhaled steroids: Secondary | ICD-10-CM | POA: Insufficient documentation

## 2015-05-01 DIAGNOSIS — Z79899 Other long term (current) drug therapy: Secondary | ICD-10-CM | POA: Insufficient documentation

## 2015-05-01 DIAGNOSIS — Z860100 Personal history of colon polyps, unspecified: Secondary | ICD-10-CM | POA: Insufficient documentation

## 2015-05-01 DIAGNOSIS — I129 Hypertensive chronic kidney disease with stage 1 through stage 4 chronic kidney disease, or unspecified chronic kidney disease: Secondary | ICD-10-CM | POA: Insufficient documentation

## 2015-05-01 DIAGNOSIS — E785 Hyperlipidemia, unspecified: Secondary | ICD-10-CM | POA: Insufficient documentation

## 2015-05-01 DIAGNOSIS — E1142 Type 2 diabetes mellitus with diabetic polyneuropathy: Secondary | ICD-10-CM | POA: Diagnosis not present

## 2015-05-01 DIAGNOSIS — K5730 Diverticulosis of large intestine without perforation or abscess without bleeding: Secondary | ICD-10-CM | POA: Insufficient documentation

## 2015-05-01 HISTORY — PX: COLONOSCOPY: SHX5424

## 2015-05-01 LAB — GLUCOSE, CAPILLARY: Glucose-Capillary: 146 mg/dL — ABNORMAL HIGH (ref 65–99)

## 2015-05-01 SURGERY — COLONOSCOPY
Anesthesia: Moderate Sedation

## 2015-05-01 MED ORDER — ONDANSETRON HCL 4 MG/2ML IJ SOLN
INTRAMUSCULAR | Status: DC | PRN
  Administered 2015-05-01: 4 mg via INTRAVENOUS

## 2015-05-01 MED ORDER — MIDAZOLAM HCL 5 MG/5ML IJ SOLN
INTRAMUSCULAR | Status: AC
  Filled 2015-05-01: qty 10

## 2015-05-01 MED ORDER — MEPERIDINE HCL 100 MG/ML IJ SOLN
INTRAMUSCULAR | Status: AC
  Filled 2015-05-01: qty 2

## 2015-05-01 MED ORDER — MIDAZOLAM HCL 5 MG/5ML IJ SOLN
INTRAMUSCULAR | Status: DC | PRN
  Administered 2015-05-01: 1 mg via INTRAVENOUS
  Administered 2015-05-01: 2 mg via INTRAVENOUS
  Administered 2015-05-01: 1 mg via INTRAVENOUS

## 2015-05-01 MED ORDER — ONDANSETRON HCL 4 MG/2ML IJ SOLN
INTRAMUSCULAR | Status: AC
  Filled 2015-05-01: qty 2

## 2015-05-01 MED ORDER — SIMETHICONE 40 MG/0.6ML PO SUSP
ORAL | Status: DC | PRN
  Administered 2015-05-01: 11:00:00

## 2015-05-01 MED ORDER — SODIUM CHLORIDE 0.9 % IV SOLN
INTRAVENOUS | Status: DC
  Administered 2015-05-01: 1000 mL via INTRAVENOUS

## 2015-05-01 MED ORDER — MEPERIDINE HCL 100 MG/ML IJ SOLN
INTRAMUSCULAR | Status: DC | PRN
  Administered 2015-05-01 (×2): 25 mg via INTRAVENOUS

## 2015-05-01 NOTE — Interval H&P Note (Signed)
History and Physical Interval Note:  05/01/2015 11:32 AM  Kyle [name]  has presented today for surgery, with the diagnosis of HISTORY OF POLYPS  The various methods of treatment have been discussed with the patient and family. After consideration of risks, benefits and other options for treatment, the patient has consented to  Procedure(s) with comments: COLONOSCOPY (N/A) - 1115 as a surgical intervention .  The patient's history has been reviewed, patient examined, no change in status, stable for surgery.  I have reviewed the patient's chart and labs.  Questions were answered to the patient's satisfaction.     Shreeya Recendiz  No change. Surveillance colonoscopy per plan.  The risks, benefits, limitations, alternatives and imponderables have been reviewed with the patient. Questions have been answered. All parties are agreeable.

## 2015-05-01 NOTE — Op Note (Signed)
[facility] [address] [place], [address]   COLONOSCOPY PROCEDURE REPORT  PATIENT: Kyle [name], Kyle [name]  MR#: [id number] BIRTHDATE: [birth year] , 29  yrs. old GENDER: male ENDOSCOPIST: R.  Garfield Cornea, MD FACP [facility] REFERRED ZE:[id number] Wolfgang Phoenix, M.D. PROCEDURE DATE:  May 04, 2015 PROCEDURE:   Colonoscopy with snare polypectomy INDICATIONS:Surveillance examination; history of colonic adenoma. MEDICATIONS: Versed 4 mg IV and Demerol 50 mg IV in divided doses. Zofran 4 mg IV. ASA CLASS:       Class III  CONSENT: The risks, benefits, alternatives and imponderables including but not limited to bleeding, perforation as well as the possibility of a missed lesion have been reviewed.  The potential for biopsy, lesion removal, etc. have also been discussed. Questions have been answered.  All parties agreeable.  Please see the history and physical in the medical record for more information.  DESCRIPTION OF PROCEDURE:   After the risks benefits and alternatives of the procedure were thoroughly explained, informed consent was obtained.  The digital rectal exam revealed no abnormalities of the rectum.   The EC-[id number] ([id number])  endoscope was introduced through the anus and advanced to the cecum, which was identified by both the appendix and ileocecal valve. No adverse events experienced.   The quality of the prep was adequate  The instrument was then slowly withdrawn as the colon was fully examined. Estimated blood loss is zero unless otherwise noted in this procedure report.      COLON FINDINGS: Normal-appearing rectal mucosa.  Redundant colon. Scattered left-sided diverticula; multiple 3-6 mm polyps about the hepatic flexure and transverse segments; (1) 3 mm polyp in the base of the cecum; otherwise, the remainder of the colonic mucosa appeared normal.  All polyps removed with cold snare technique.  Retroflexion was performed. .  Withdrawal time=10 minutes 0 seconds.   The scope was withdrawn and the procedure completed. COMPLICATIONS: There were no immediate complications. EBL 6 mL ENDOSCOPIC IMPRESSION: Redundant colon. Colonic diverticulosis. Multiple colonic polyps?"removed as described above  RECOMMENDATIONS: Follow up on pathology.  eSigned:  R. Garfield Cornea, MD Rosalita Chessman [facility] 05/04/2015 12:12 PM   cc:  CPT CODES: ICD CODES:  The ICD and CPT codes recommended by this software are interpretations from the data that the clinical staff has captured with the software.  The verification of the translation of this report to the ICD and CPT codes and modifiers is the sole responsibility of the health care institution and practicing physician where this report was generated.  [employer] will not be held responsible for the validity of the ICD and CPT codes included on this report.  AMA assumes no liability for data contained or not contained herein. CPT is a Designer, television/film set of the [employer].  PATIENT NAME:  Kyle [name], Kyle [name] MR#: [id number]

## 2015-05-01 NOTE — H&P (View-Only) (Signed)
Primary Care Physician:  Mickie Hillier, MD Primary Gastroenterologist:  Dr. Gala Romney   Chief Complaint  Patient presents with  . Colonoscopy    HPI:   Kyle [name] is a 81 y.o. male presenting today for an office visit prior to surveillance colonoscopy. He has a history of colonic adenoma, due for surveillance now. Actually had flex sig in 2014 by Dr. Delfin Edis with tubular adenoma noted and negative segmental biopsies. History of Cdiff in 2014.   Diarrhea resolved and able to control getting to the bathroom. He has had issues with incontinence in the past. No abdominal pain. No N/V. No reflux exacerbation. No dysphagia.     Past Medical History  Diagnosis Date  . Arteriosclerotic cardiovascular disease (ASCVD) 1973, 12/2010    S/P NSTEMI secondary to distal RCA/PL lesion, tx medically.  EF of  55%-60% per  echo.  . Diabetes mellitus     Type II  . Thrombocytopenia   . COPD (chronic obstructive pulmonary disease)   . AAA (abdominal aortic aneurysm) 2004    s/p repair 2004; 4.3 cm infrarenal in 05/2011  . Tobacco abuse     50-pack-year consumption; quit in 12/2010  . Hyperlipidemia   . OSA (obstructive sleep apnea)   . Hypertension   . Benign prostatic hypertrophy     s/p transurethral resection of the prostate  . Obesity   . Tubular adenoma of colon   . Cataract   . Ventral hernia   . Diverticulosis   . Bilateral renal masses     Cystic, more prominent on CT in 12/2010 than 2007; followed by Dr. Rosana Hoes  . ITP (idiopathic thrombocytopenic purpura) 09/07/2012    Chronic ITP of adults versus medication-induced ITP.  Stable  . Insomnia   . Bladder cancer 1996    Transurethral resection of the bladder + chemotherapy/BCG as premed  . Adenocarcinoma, lung 01/2011    transthoracic FNA; resection of the superior segment of the RLL in 03/2011; negative nodes; no chemotherapy nor radiation planned  . Chronic kidney disease     Creatinine 1.4 on discharge 12/20/2100; proteinuria;  normal renal ultrasound in 2010; recent creatinines of 1.7-2.; Bilateral cystic renal masses by CT in 2011  . Nephrolithiasis 2012    ARF in 01/2011 due to obstructing nephrolithiasis  . Arm fracture     right arm  . Essential and other specified forms of tremor 02/23/2013  . Diplopia 02/23/2013  . Polyneuropathy in diabetes(357.2) 02/23/2013  . Abnormality of gait 02/23/2013  . Hx of Clostridium difficile infection   . Adenocarcinoma of right lung 02/24/2011    Ct A/P 2012:  2cm lung mass RLL PET 3710:  Hypermetabolic RLL mass, no other hypermetabolic areas. TTNA 02/2011:  Adenocarcinoma, markers c/w lung origin Right lower lobe superior segmentectomy. 04/01/2011 Dr. Arlyce Dice   . Coronary artery disease   . Myocardial infarction   . Cough     thick phlegm    Past Surgical History  Procedure Laterality Date  . Transurethral resection of prostate    . Cystectomy    . Tonsillectomy    . Colonoscopy  03/19/2010    Dr. Gala Romney -(poor prep) Anal papilla, rectal hyperplastic polyp, tubular adenoma removed splenic flexure, left-sided diverticula  . Wedge resection  04/2011    carcinoma of lung  . Colonoscopy  11/28/2004    RMR:  Diminutive rectal and left colon polyps as described above, cold  biopsied/removed/  Left sided diverticula. The remainder of the colonic mucosa  appeared normal.  . Colonoscopy   09/15/01    RMR: Multiple diminutive polyps destroyed with dermolysis as described above/ Multiple small polyps on stalks in the colon resected with snare cautery/ Scattered pan colonic diverticulum/ The remainder of the colonic mucosa appeared normal  . Abdominal aortic aneurysm repair  2004  . Cardiac catheterization    . Lung lobectomy    . Video bronchoscopy with endobronchial navigation N/A 10/10/2013    Procedure: VIDEO BRONCHOSCOPY WITH ENDOBRONCHIAL NAVIGATION;  Surgeon: Melrose Nakayama, MD;  Location: [place];  Service: Thoracic;  Laterality: N/A;  NO BLOOD THINNERS BUT PATIENT HAS ITP  .  Cystoscopy  04/2014  . Flexible sigmoidoscopy  2014    Dr. Olevia Perches: tubular adenoma, negative stool studies     Current Outpatient Prescriptions  Medication Sig Dispense Refill  . ACCU-CHEK AVIVA PLUS test strip USE AS DIRECTED TO TEST ONCE DAILY. 50 each 6  . acetaminophen (TYLENOL) 500 MG tablet Take 500 mg by mouth every 6 (six) hours as needed for mild pain.     Marland Kitchen albuterol (PROAIR HFA) 108 (90 BASE) MCG/ACT inhaler Inhale 2 puffs into the lungs every 6 (six) hours as needed for wheezing. 1 Inhaler 5  . ALPRAZolam (XANAX) 0.5 MG tablet Take 1 tablet (0.5 mg total) by mouth 2 (two) times daily as needed for anxiety. 60 tablet 5  . aspirin EC 81 MG tablet Take 81 mg by mouth daily.     . calcitRIOL (ROCALTROL) 0.25 MCG capsule 3 times a week M,W,F    . cetirizine (ZYRTEC) 10 MG tablet Take 1 tablet (10 mg total) by mouth daily. 30 tablet 5  . citalopram (CELEXA) 20 MG tablet Take 1 tablet (20 mg total) by mouth daily. 90 tablet 1  . ferrous sulfate (FERROUSUL) 325 (65 FE) MG tablet Take 325 mg by mouth daily with breakfast.      . fluticasone (FLONASE) 50 MCG/ACT nasal spray Place 2 sprays into both nostrils daily as needed for allergies. 16 g 5  . ipratropium (ATROVENT) 0.06 % nasal spray Place 2 sprays into both nostrils 4 (four) times daily. (Patient taking differently: Place 2 sprays into both nostrils as needed. ) 15 mL 5  . lisinopril (PRINIVIL,ZESTRIL) 5 MG tablet Take 1 tablet (5 mg total) by mouth daily. 90 tablet 1  . metoprolol (LOPRESSOR) 50 MG tablet Take 1 tablet (50 mg total) by mouth 2 (two) times daily. 180 tablet 1  . niacin (NIASPAN) 500 MG CR tablet Take 2 tablets (1,000 mg total) by mouth at bedtime. 60 tablet 5  . nitroGLYCERIN (NITROSTAT) 0.4 MG SL tablet Place 1 tablet (0.4 mg total) under the tongue as needed. Place 0.4 mg under the tongue every 5 (five) minutes as needed. Call MD if need more than 2 3 tablet 5  . pravastatin (PRAVACHOL) 80 MG tablet Take 1 tablet (80  mg total) by mouth daily. 90 tablet 1  . tiotropium (SPIRIVA HANDIHALER) 18 MCG inhalation capsule INHALE ONE DOSE BY MOUTH ONCE DAILY 90 capsule 5  . [DISCONTINUED] budesonide-formoterol (SYMBICORT) 160-4.5 MCG/ACT inhaler Inhale 2 puffs into the lungs 2 (two) times daily.       No current facility-administered medications for this visit.    Allergies as of 04/11/2015 - Review Complete 04/11/2015  Allergen Reaction Noted  . Codeine Anaphylaxis 09/07/2012  . Etodolac  11/20/2012    Family History  Problem Relation Age of Onset  . Aortic aneurysm Mother   . Emphysema Father  smoker  . Clotting disorder Father   . Arthritis Father   . Hypertension Father   . Diabetes Father   . Aortic aneurysm Father   . Tremor Father   . Stroke Paternal Grandmother   . Other Paternal Grandfather     brain aneurysm  . Colon cancer Cousin     Social History   Social History  . Marital Status: Married    Spouse Name: [name]   . Number of Children: 2  . Years of Education: 12+   Occupational History  . Retired  [employer]   Social History Main Topics  . Smoking status: Former Smoker -- 1.00 packs/day for 50 years    Types: Cigarettes    Start date: 07/22/1955    Quit date: 12/19/2012  . Smokeless tobacco: Never Used  . Alcohol Use: No  . Drug Use: No  . Sexual Activity: No   Other Topics Concern  . Not on file   Social History Narrative   Patient lives at home with his wife [name].    Patient has 2 children.    Patient is retired.    Patient is right handed.    Patient has 2 years of college.     Review of Systems: Gen: Denies any fever, chills, fatigue, weight loss, lack of appetite.  CV: Denies chest pain, heart palpitations, peripheral edema, syncope.  Resp: +DOE GI: see HPI GU : UTI X 2 in past year MS: +joint pain  Derm: Denies rash, itching, dry skin Psych: Denies depression, anxiety, memory loss, and confusion Heme: Denies bruising, bleeding, and enlarged  lymph nodes.  Physical Exam: BP 126/73 mmHg  Pulse 65  Temp(Src) 98.2 F (36.8 C) (Oral)  Ht '5\' 11"'$  (1.803 m)  Wt 273 lb (123.832 kg)  BMI 38.09 kg/m2 General:   Alert and oriented. Pleasant and cooperative. Well-nourished and well-developed.  Head:  Normocephalic and atraumatic. Eyes:  Without icterus, sclera clear and conjunctiva pink.  Ears:  Normal auditory acuity. Nose:  No deformity, discharge,  or lesions. Mouth:  No deformity or lesions, oral mucosa pink.  Lungs:  Clear to auscultation bilaterally. No wheezes, rales, or rhonchi. No distress.  Heart:  S1, S2 present without murmurs appreciated.  Abdomen:  +BS, soft, obese. Large ventral hernia, easily reducible. No HSM. No TTP Rectal:  Deferred  Msk:  Symmetrical without gross deformities. Normal posture. Extremities:  Without  edema. Neurologic:  Alert and  oriented x4;  grossly normal neurologically. Skin:  Intact without significant lesions or rashes. Psych:  Alert and cooperative. Normal mood and affect.

## 2015-05-01 NOTE — Discharge Instructions (Signed)
Colonoscopy Discharge Instructions  Read the instructions outlined below and refer to this sheet in the next few weeks. These discharge instructions provide you with general information on caring for yourself after you leave the hospital. Your doctor may also give you specific instructions. While your treatment has been planned according to the most current medical practices available, unavoidable complications occasionally occur. If you have any problems or questions after discharge, call Dr. Gala Romney at [phone]. ACTIVITY  You may resume your regular activity, but move at a slower pace for the next 24 hours.   Take frequent rest periods for the next 24 hours.   Walking will help get rid of the air and reduce the bloated feeling in your belly (abdomen).   No driving for 24 hours (because of the medicine (anesthesia) used during the test).    Do not sign any important legal documents or operate any machinery for 24 hours (because of the anesthesia used during the test).  NUTRITION  Drink plenty of fluids.   You may resume your normal diet as instructed by your doctor.   Begin with a light meal and progress to your normal diet. Heavy or fried foods are harder to digest and may make you feel sick to your stomach (nauseated).   Avoid alcoholic beverages for 24 hours or as instructed.  MEDICATIONS  You may resume your normal medications unless your doctor tells you otherwise.  WHAT YOU CAN EXPECT TODAY  Some feelings of bloating in the abdomen.   Passage of more gas than usual.   Spotting of blood in your stool or on the toilet paper.  IF YOU HAD POLYPS REMOVED DURING THE COLONOSCOPY:  No aspirin products for 7 days or as instructed.   No alcohol for 7 days or as instructed.   Eat a soft diet for the next 24 hours.  FINDING OUT THE RESULTS OF YOUR TEST Not all test results are available during your visit. If your test results are not back during the visit, make an appointment  with your caregiver to find out the results. Do not assume everything is normal if you have not heard from your caregiver or the medical facility. It is important for you to follow up on all of your test results.  SEEK IMMEDIATE MEDICAL ATTENTION IF:  You have more than a spotting of blood in your stool.   Your belly is swollen (abdominal distention).   You are nauseated or vomiting.   You have a temperature over 101.   You have abdominal pain or discomfort that is severe or gets worse throughout the day.   Colonic diverticulosis and polyp information provided  Further recommendations to follow pending review of pathology report  Colon Polyps Polyps are lumps of extra tissue growing inside the body. Polyps can grow in the large intestine (colon). Most colon polyps are noncancerous (benign). However, some colon polyps can become cancerous over time. Polyps that are larger than a pea may be harmful. To be safe, caregivers remove and test all polyps. CAUSES  Polyps form when mutations in the genes cause your cells to grow and divide even though no more tissue is needed. RISK FACTORS There are a number of risk factors that can increase your chances of getting colon polyps. They include:  Being older than 50 years.  Family history of colon polyps or colon cancer.  Long-term colon diseases, such as colitis or Crohn disease.  Being overweight.  Smoking.  Being inactive.  Drinking too much  alcohol. SYMPTOMS  Most small polyps do not cause symptoms. If symptoms are present, they may include:  Blood in the stool. The stool may look dark red or black.  Constipation or diarrhea that lasts longer than 1 week. DIAGNOSIS People often do not know they have polyps until their caregiver finds them during a regular checkup. Your caregiver can use 4 tests to check for polyps:  Digital rectal exam. The caregiver wears gloves and feels inside the rectum. This test would find polyps only in  the rectum.  Barium enema. The caregiver puts a liquid called barium into your rectum before taking X-rays of your colon. Barium makes your colon look white. Polyps are dark, so they are easy to see in the X-ray pictures.  Sigmoidoscopy. A thin, flexible tube (sigmoidoscope) is placed into your rectum. The sigmoidoscope has a light and tiny camera in it. The caregiver uses the sigmoidoscope to look at the last third of your colon.  Colonoscopy. This test is like sigmoidoscopy, but the caregiver looks at the entire colon. This is the most common method for finding and removing polyps. TREATMENT  Any polyps will be removed during a sigmoidoscopy or colonoscopy. The polyps are then tested for cancer. PREVENTION  To help lower your risk of getting more colon polyps:  Eat plenty of fruits and vegetables. Avoid eating fatty foods.  Do not smoke.  Avoid drinking alcohol.  Exercise every day.  Lose weight if recommended by your caregiver.  Eat plenty of calcium and folate. Foods that are rich in calcium include milk, cheese, and broccoli. Foods that are rich in folate include chickpeas, kidney beans, and spinach. HOME CARE INSTRUCTIONS Keep all follow-up appointments as directed by your caregiver. You may need periodic exams to check for polyps. SEEK MEDICAL CARE IF: You notice bleeding during a bowel movement.   This information is not intended to replace advice given to you by your health care provider. Make sure you discuss any questions you have with your health care provider.   Document Released: 04/02/2004 Document Revised: 07/28/2014 Document Reviewed: 09/16/2011 Elsevier Interactive Patient Education 2016 [employer].  Diverticulosis Diverticulosis is the condition that develops when small pouches (diverticula) form in the wall of your colon. Your colon, or large intestine, is where water is absorbed and stool is formed. The pouches form when the inside layer of your colon pushes  through weak spots in the outer layers of your colon. CAUSES  No one knows exactly what causes diverticulosis. RISK FACTORS  Being older than 79. Your risk for this condition increases with age. Diverticulosis is rare in people younger than 40 years. By age 45, almost everyone has it.  Eating a low-fiber diet.  Being frequently constipated.  Being overweight.  Not getting enough exercise.  Smoking.  Taking over-the-counter pain medicines, like aspirin and ibuprofen. SYMPTOMS  Most people with diverticulosis do not have symptoms. DIAGNOSIS  Because diverticulosis often has no symptoms, health care providers often discover the condition during an exam for other colon problems. In many cases, a health care provider will diagnose diverticulosis while using a flexible scope to examine the colon (colonoscopy). TREATMENT  If you have never developed an infection related to diverticulosis, you may not need treatment. If you have had an infection before, treatment may include:  Eating more fruits, vegetables, and grains.  Taking a fiber supplement.  Taking a live bacteria supplement (probiotic).  Taking medicine to relax your colon. HOME CARE INSTRUCTIONS   Drink at least  6-8 glasses of water each day to prevent constipation.  Try not to strain when you have a bowel movement.  Keep all follow-up appointments. If you have had an infection before:  Increase the fiber in your diet as directed by your health care provider or dietitian.  Take a dietary fiber supplement if your health care provider approves.  Only take medicines as directed by your health care provider. SEEK MEDICAL CARE IF:   You have abdominal pain.  You have bloating.  You have cramps.  You have not gone to the bathroom in 3 days. SEEK IMMEDIATE MEDICAL CARE IF:   Your pain gets worse.  Yourbloating becomes very bad.  You have a fever or chills, and your symptoms suddenly get worse.  You begin  vomiting.  You have bowel movements that are bloody or black. MAKE SURE YOU:  Understand these instructions.  Will watch your condition.  Will get help right away if you are not doing well or get worse.   This information is not intended to replace advice given to you by your health care provider. Make sure you discuss any questions you have with your health care provider.   Document Released: 04/03/2004 Document Revised: 07/12/2013 Document Reviewed: 06/01/2013 Elsevier Interactive Patient Education [employer].

## 2015-05-03 ENCOUNTER — Encounter: Payer: Self-pay | Admitting: Internal Medicine

## 2015-05-07 ENCOUNTER — Encounter (HOSPITAL_COMMUNITY): Payer: Self-pay | Admitting: Internal Medicine

## 2015-05-17 ENCOUNTER — Ambulatory Visit: Payer: Medicare Other | Admitting: Family Medicine

## 2015-05-20 ENCOUNTER — Other Ambulatory Visit: Payer: Self-pay | Admitting: Family Medicine

## 2015-05-20 DIAGNOSIS — J449 Chronic obstructive pulmonary disease, unspecified: Secondary | ICD-10-CM | POA: Diagnosis not present

## 2015-05-21 ENCOUNTER — Ambulatory Visit: Payer: Medicare Other | Admitting: Family Medicine

## 2015-05-21 DIAGNOSIS — N3091 Cystitis, unspecified with hematuria: Secondary | ICD-10-CM | POA: Diagnosis not present

## 2015-05-21 DIAGNOSIS — D090 Carcinoma in situ of bladder: Secondary | ICD-10-CM | POA: Diagnosis not present

## 2015-05-21 DIAGNOSIS — C672 Malignant neoplasm of lateral wall of bladder: Secondary | ICD-10-CM | POA: Diagnosis not present

## 2015-05-22 ENCOUNTER — Ambulatory Visit (INDEPENDENT_AMBULATORY_CARE_PROVIDER_SITE_OTHER): Payer: Medicare Other | Admitting: Family Medicine

## 2015-05-22 ENCOUNTER — Encounter: Payer: Self-pay | Admitting: Family Medicine

## 2015-05-22 VITALS — BP 120/80 | Ht 72.0 in | Wt 274.1 lb

## 2015-05-22 DIAGNOSIS — Z23 Encounter for immunization: Secondary | ICD-10-CM

## 2015-05-22 DIAGNOSIS — J449 Chronic obstructive pulmonary disease, unspecified: Secondary | ICD-10-CM | POA: Diagnosis not present

## 2015-05-22 DIAGNOSIS — I10 Essential (primary) hypertension: Secondary | ICD-10-CM | POA: Diagnosis not present

## 2015-05-22 DIAGNOSIS — E119 Type 2 diabetes mellitus without complications: Secondary | ICD-10-CM

## 2015-05-22 LAB — POCT GLYCOSYLATED HEMOGLOBIN (HGB A1C): Hemoglobin A1C: 6.1

## 2015-05-22 MED ORDER — AMOXICILLIN 500 MG PO CAPS: 500.0000 mg | ORAL_CAPSULE | Freq: Three times a day (TID) | ORAL | Status: DC

## 2015-05-22 NOTE — Progress Notes (Signed)
   Subjective:    Patient ID: Kyle [name], male    DOB: 05-15-1934, 81 y.o.   MRN: [id number]  Diabetes He presents for his follow-up diabetic visit. He has type 2 diabetes mellitus. There are no hypoglycemic associated symptoms. There are no diabetic associated symptoms. There are no hypoglycemic complications. Symptoms are stable. There are no diabetic complications. There are no known risk factors for coronary artery disease. Current diabetic treatment includes diet. He is compliant with treatment all of the time.   Last diabetic eye exam- 02/15/15  Patient has cough and congestion. Onset 7-10 days ago.coughin up a lot of phlegm started then . No sig wheezing , no sob   Patient reports compliance with blood pressure medication. Medications reviewed. Blood pressure showing good when checked elsewhere watching salt intake.    Results for orders placed or performed in visit on 05/22/15  POCT glycosylated hemoglobin (Hb A1C)  Result Value Ref Range   Hemoglobin A1C 6.1    occas slightly low sugar but no symtoms  Pt states gets strangled at times, drinking fluid and water , happens sporadically, occurs if head and neck is in a certain postion     Review of Systems    no headache no chest pain no back pain no abdominal pain ROS otherwise negative Objective:   Physical Exam Alert vitals stable HET moderate his congestion pharynx normal neck supple lungs no wheezes today intermittent bronchial cough heart rare rhythm ankles trace edema       Assessment & Plan:  Impression 1 type 2 diabetes good control discussed maintain same meds #2 hypertension good control discussed maintain same meds #3 rhinosinusitis/bronchitis at antibiotics rationale discussed #4 intermittent pharyngeal brief aspiration discussed swallowing modifications discussed plan as noted above flu shot today. Follow-up as scheduled diet exercise discussed [facility]

## 2015-05-24 DIAGNOSIS — D090 Carcinoma in situ of bladder: Secondary | ICD-10-CM | POA: Diagnosis not present

## 2015-05-24 DIAGNOSIS — R319 Hematuria, unspecified: Secondary | ICD-10-CM | POA: Diagnosis not present

## 2015-05-30 DIAGNOSIS — G4733 Obstructive sleep apnea (adult) (pediatric): Secondary | ICD-10-CM | POA: Diagnosis not present

## 2015-06-03 ENCOUNTER — Other Ambulatory Visit: Payer: Self-pay | Admitting: Family Medicine

## 2015-06-20 DIAGNOSIS — J449 Chronic obstructive pulmonary disease, unspecified: Secondary | ICD-10-CM | POA: Diagnosis not present

## 2015-06-22 DIAGNOSIS — C672 Malignant neoplasm of lateral wall of bladder: Secondary | ICD-10-CM | POA: Diagnosis not present

## 2015-06-22 DIAGNOSIS — D090 Carcinoma in situ of bladder: Secondary | ICD-10-CM | POA: Diagnosis not present

## 2015-06-27 ENCOUNTER — Other Ambulatory Visit: Payer: Self-pay | Admitting: *Deleted

## 2015-06-27 ENCOUNTER — Ambulatory Visit: Payer: Self-pay

## 2015-06-27 NOTE — Patient Outreach (Signed)
[facility]) Care Management  06/27/2015  Kyle [name] 1934-03-26 [id number]   RN Health Coach telephone call to patient.  [employer] compliance verified.  RN Health Coach spoke with patient. Per patient he is doing good. Per patient he  did not check his blood sugar today. Per patient he stated he doesn't check every day.  Patient encouraged to check blood sugars and document. Patient is not having any swelling in extremities or other symptoms. Patient was in a very upbeat mood. Today is his birthday. Patient has concerns about wife. Today she was marked for her radiation to start soon. Patient has agreed to follow up outreach quarterly calls.  Assessment Patient would continue to benefit from [place] telephonic outreach for education and support for diabetes  [place] will follow up quarterly   [place] will send patient a chart on food high and low in sodium RN Health coach will send 2017 calendar book  [place] [facility] [phone]

## 2015-06-28 ENCOUNTER — Encounter: Payer: Self-pay | Admitting: *Deleted

## 2015-06-28 DIAGNOSIS — I714 Abdominal aortic aneurysm, without rupture: Secondary | ICD-10-CM | POA: Diagnosis not present

## 2015-06-29 ENCOUNTER — Encounter (HOSPITAL_COMMUNITY): Payer: Self-pay | Admitting: Hematology & Oncology

## 2015-06-29 ENCOUNTER — Other Ambulatory Visit: Payer: Self-pay | Admitting: Family Medicine

## 2015-06-29 ENCOUNTER — Encounter (HOSPITAL_COMMUNITY): Payer: Medicare Other | Attending: Hematology & Oncology | Admitting: Hematology & Oncology

## 2015-06-29 VITALS — BP 149/71 | HR 68 | Temp 97.4°F | Resp 18 | Wt 273.4 lb

## 2015-06-29 DIAGNOSIS — Z85110 Personal history of malignant carcinoid tumor of bronchus and lung: Secondary | ICD-10-CM | POA: Diagnosis not present

## 2015-06-29 DIAGNOSIS — D696 Thrombocytopenia, unspecified: Secondary | ICD-10-CM

## 2015-06-29 DIAGNOSIS — C3491 Malignant neoplasm of unspecified part of right bronchus or lung: Secondary | ICD-10-CM

## 2015-06-29 DIAGNOSIS — N183 Chronic kidney disease, stage 3 (moderate): Secondary | ICD-10-CM | POA: Diagnosis not present

## 2015-06-29 MED ORDER — AZITHROMYCIN 250 MG PO TABS: ORAL_TABLET | ORAL | Status: DC

## 2015-06-29 NOTE — Progress Notes (Signed)
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  INTERVAL HISTORY: Kyle [name] 81 y.o. male returns for  regular  visit for followup of Stage I a (T1 B., N0, M0) moderately differentiated adenocarcinoma the lung, 2.3 cm in size with surgery on 04/03/2011. Margins were clear, no LV I was seen, and there is no pleural involvement. 6 lymph nodes were all negative getting stage IA disease.  AND Thrombocytopenia thus far consistent with chronic ITP of adults versus drug-induced mild thrombocytopenia. He is on too many medications to withdraw them completely. There is no evidence on imaging of splenomegaly or cirrhosis.   He sleeps well at night using a CPAP machine. He has had 2 colonoscopy's, Dr. Sydell Axon. He gets his flu shot every year.   He returns to the cancer center alone today. He says he is "doing pretty good, I think."  He reports that his breathing has been pretty good, with his oxygen level staying around 94-95.  He says he does have some new congestion as of the past two weeks, "draining." He is unsure if he needs an antibiotic. He reports that the drainage is clear, and that he takes an allergy pill every day. He says he is coughing more lately, but he thinks it's due to the temperature change. But he does remark that he's coughing "way too much."  Regarding his hernia, he says "it's just the same." He says it doesn't bother him. He says his appetite is good and he's maintaining his weight; he's not losing any. Last CT imaging was on 12/25/2014.     Adenocarcinoma of right lung ([place])   04/03/2011 Initial Diagnosis INVASIVE MODERATELY DIFFERENTIATED ADENOCARCINOMA, 2.3 CM. (T1b, N0).  Clear margins, no LVI, no pleural involvement. 0/6 nodes.   04/03/2011 Surgery Right lower lobe superior segmentectomy by Dr. Arlyce Dice   04/04/2011 Remission    03/07/2013 Imaging CT of chest-  developing adenopathy in the subcarinal and azygo-esophageal recess stations, mild.  Findings are suspicious for recurrent or localized metastatic disease.    03/16/2013 Imaging PET scan- No definite metabolically active pulmonary lesion and no mediastinal or hilar lymphadenopathy.     06/15/2013 Imaging CT scan of chest- Ill-defined pulmonary density in RLL in the region of patient's prior surgery, less prominent than on prior CT obtained for PET-CT of 02/25/2013. This is most likely region ofscarring. Underlying tumor cannot be excluded   09/19/2013 Imaging CT of chest- Progressive volume loss medially at the right lung base. Differential diagnostic considerations include mucus plugging, radiation therapy related findings, or progressive bronchogenic carcinoma   10/03/2013 Imaging PET scan- Low level residual FDG uptake in the area of previous surgery with moderate atelectasis. I do not see any findings suspicious for recurrent or residual tumor.   10/10/2013 Procedure Bronchcospy with biopsies and brushings by Dr. Roxan Hockey   10/10/2013 Pathology Results RLL biopsies are negative for malignancy    Bladder carcinoma ([place])   03/27/2011 Initial Diagnosis Bladder carcinoma    - 10/05/2013 Chemotherapy Completed continuous bladder infusion chemotherapy at [facility]     Past Medical History  Diagnosis Date  . Arteriosclerotic cardiovascular disease (ASCVD) 1973, 12/2010    S/P NSTEMI secondary to distal RCA/PL lesion, tx medically.  EF of  55%-60% per  echo.  . Diabetes mellitus     Type II  . Thrombocytopenia ([place])   . COPD (  chronic obstructive pulmonary disease) ([place])   . AAA (abdominal aortic aneurysm) ([place]) 2004    s/p repair 2004; 4.3 cm infrarenal in 05/2011  . Tobacco abuse     50-pack-year consumption; quit in 12/2010  . Hyperlipidemia   . OSA (obstructive sleep apnea)   . Hypertension   . Benign prostatic hypertrophy     s/p transurethral  resection of the prostate  . Obesity   . Tubular adenoma of colon   . Cataract   . Ventral hernia   . Diverticulosis   . Bilateral renal masses     Cystic, more prominent on CT in 12/2010 than 2007; followed by Dr. Rosana Hoes  . ITP (idiopathic thrombocytopenic purpura) 09/07/2012    Chronic ITP of adults versus medication-induced ITP.  Stable  . Insomnia   . Bladder cancer [facility]) 1996    Transurethral resection of the bladder + chemotherapy/BCG as premed  . Adenocarcinoma, lung ([place]) 01/2011    transthoracic FNA; resection of the superior segment of the RLL in 03/2011; negative nodes; no chemotherapy nor radiation planned  . Chronic kidney disease     Creatinine 1.4 on discharge 12/20/2100; proteinuria; normal renal ultrasound in 2010; recent creatinines of 1.7-2.; Bilateral cystic renal masses by CT in 2011  . Nephrolithiasis 2012    ARF in 01/2011 due to obstructing nephrolithiasis  . Arm fracture     right arm  . Essential and other specified forms of tremor 02/23/2013  . Diplopia 02/23/2013  . Polyneuropathy in diabetes(357.2) 02/23/2013  . Abnormality of gait 02/23/2013  . Hx of Clostridium difficile infection   . Adenocarcinoma of right lung ([place]) 02/24/2011    Ct A/P 2012:  2cm lung mass RLL PET 7322:  Hypermetabolic RLL mass, no other hypermetabolic areas. TTNA 02/2011:  Adenocarcinoma, markers c/w lung origin Right lower lobe superior segmentectomy. 04/01/2011 Dr. Arlyce Dice   . Coronary artery disease   . Myocardial infarction ([place])   . Cough     thick phlegm    has Arteriosclerotic cardiovascular disease (ASCVD); Hypertension; CKD (chronic kidney disease) stage 3, GFR 30-59 ml/min; COPD (chronic obstructive pulmonary disease) ([place]); Adenocarcinoma of right lung ([place]); Obstructive sleep apnea; Fasting hyperglycemia; Nephrolithiasis; Bladder carcinoma ([place]); Diarrhea; Tubular adenoma of colon; Ventral hernia; AAA (abdominal aortic aneurysm) ([place]); Tobacco abuse; LLQ pain; ITP (idiopathic  thrombocytopenic purpura); Diabetes ([place]); Fracture of right distal radius; COPD exacerbation ([place]); Essential and other specified forms of tremor; Diplopia; Diabetic polyneuropathy ([place]); Abnormality of gait; History of colonic polyps; and Diverticulosis of colon without hemorrhage on his problem list.     is allergic to codeine and etodolac.  Kyle [name] does not currently have medications on file.  Past Surgical History  Procedure Laterality Date  . Transurethral resection of prostate    . Cystectomy    . Tonsillectomy    . Colonoscopy  03/19/2010    Dr. Gala Romney -(poor prep) Anal papilla, rectal hyperplastic polyp, tubular adenoma removed splenic flexure, left-sided diverticula  . Wedge resection  04/2011    carcinoma of lung  . Colonoscopy  11/28/2004    RMR:  Diminutive rectal and left colon polyps as described above, cold  biopsied/removed/  Left sided diverticula. The remainder of the colonic mucosa appeared normal.  . Colonoscopy   09/15/01    RMR: Multiple diminutive polyps destroyed with dermolysis as described above/ Multiple small polyps on stalks in the colon resected with snare cautery/ Scattered pan colonic diverticulum/ The remainder of the colonic mucosa appeared normal  . Abdominal  aortic aneurysm repair  2004  . Cardiac catheterization    . Lung lobectomy    . Video bronchoscopy with endobronchial navigation N/A 10/10/2013    Procedure: VIDEO BRONCHOSCOPY WITH ENDOBRONCHIAL NAVIGATION;  Surgeon: Melrose Nakayama, MD;  Location: [place];  Service: Thoracic;  Laterality: N/A;  NO BLOOD THINNERS BUT PATIENT HAS ITP  . Cystoscopy  04/2014  . Flexible sigmoidoscopy  2014    Dr. Olevia Perches: tubular adenoma, negative stool studies   . Colonoscopy N/A 05/01/2015    Procedure: COLONOSCOPY;  Surgeon: Daneil Dolin, MD;  [facility];  Service: Endoscopy;  Laterality: N/A;  1115    Denies any headaches, dizziness, double vision, fevers, chills, night sweats, nausea,  vomiting, diarrhea, chest pain, heart palpitations, shortness of breath, blood in stool, black tarry stool, urinary pain, urinary burning, urinary frequency, hematuria. Positive for constipation and joint pain. 14 point review of systems was performed and is negative except as detailed under history of present illness and above   PHYSICAL EXAMINATION  ECOG PERFORMANCE STATUS: 1 - Symptomatic but completely ambulatory  Filed Vitals:   06/29/15 1100  BP: 149/71  Pulse: 68  Temp: 97.4 F (36.3 C)  Resp: 18    GENERAL:alert, no distress, well nourished, well developed, comfortable, cooperative, obese and smiling SKIN: skin color, texture, turgor are normal, no rashes or significant lesions HEAD: Normocephalic, No masses, lesions, tenderness or abnormalities EYES: normal, PERRLA, EOMI, Conjunctiva are pink and non-injected EARS: External ears normal OROPHARYNX:mucous membranes are moist  NECK: supple, trachea midline LYMPH:  No axillary adenopathy, no adenopathy in the neck or supraclavicular areas LUNGS: clear to auscultation  HEART: regular rate & rhythm, no murmurs and no gallops ABDOMEN:obese Large abdominal wall Hernia, easily reduced. BACK: Back symmetric, no curvature. EXTREMITIES:less then 2 second capillary refill, no skin discoloration, no cyanosis  NEURO: alert & oriented x 3 with fluent speech, no focal motor/sensory deficits, gait is slow, slight limp   LABORATORY DATA: I have reviewed the data as listed.  CBC    Component Value Date/Time   WBC 7.5 12/25/2014 0858   RBC 4.87 12/25/2014 0858   HGB 15.4 12/25/2014 0858   HCT 46.9 12/25/2014 0858   PLT 118* 12/25/2014 0858   MCV 96.3 12/25/2014 0858   MCH 31.6 12/25/2014 0858   MCHC 32.8 12/25/2014 0858   RDW 15.5 12/25/2014 0858   LYMPHSABS 2.2 12/25/2014 0858   MONOABS 0.5 12/25/2014 0858   EOSABS 0.2 12/25/2014 0858   BASOSABS 0.0 12/25/2014 0858     CLINICAL DATA: 81 year old male with a history of  lung cancer 4 years prior.  Superior segment right lower lobe resection with invasive moderately differentiated adenocarcinoma, (size 2.3 cm) pathology report 04/03/2011. Six lymph nodes negative for metastatic disease (TNM = T1b,N0).  EXAM: CT CHEST WITH CONTRAST  IMPRESSION: Re- demonstration of surgical changes of the right lower lobe, compatible with superior segment resection (04/03/2011), with improving nodular opacity at the surgical site, and no evidence on the current of progression/recurrence. Continued surveillance is recommended.  Atherosclerosis involving the thoracic aorta and the branch vessels. Soft plaque at the innominate artery contributes to narrowing, though likely not hemodynamically significant. This can be confirmed with right and left upper extremity systolic blood pressure surveillance, with development of a differential of greater than 15 mmHg indicating hemodynamic significance.  Two vessel coronary artery disease.  Partially imaged juxta-renal abdominal aortic aneurysm, which measures 4.4 cm on the current, and 4.3 cm on CT abdomen  01/22/2011. Recommend followup by ultrasound in 1 year. This recommendation follows ACR consensus guidelines: White Paper of the ACR Incidental Findings Committee II on Vascular Findings. J Am Coll Radiol 2013; 10:789-794.  Left adrenal lesion compatible with adenoma on previous abdominal CT.  Additional incidental as above.  Signed,  Dulcy Fanny. Earleen Newport, DO  [facility]  [facility]   Electronically Signed  By: Corrie Mckusick D.O.  On: 12/25/2014 10:04   PATHOLOGY:  10/10/2013  Diagnosis 1. Lung, biopsy, RLL - BENIGN LUNG TISSUE, SEE COMMENT. - NEGATIVE FOR ATYPIA OR MALIGNANCY. 2. Lung, biopsy, RLL - BENIGN LUNG TISSUE, SEE COMMENT. - NEGATIVE FOR ATYPIA OR MALIGNANCY. Microscopic Comment 1. Multiple biopsies demonstrate non neoplastic  lung with reactive bronchial respiratory-type epithelium overlying thickened basement membrane. Within the subepithelial tissue, there is prominent lymphoplasmacytic inflammation and edema. There are no features of epithelial dysplasia or malignancy present. The case was reviewed with Dr. Gari Crown who concurs. 2. Multiple biopsies demonstrate non neoplastic lung with reactive bronchial respiratory-type epithelium overlying thickened basement. Within the subepithelial tissue, there is chronic lymphoplasmacytic inflammation, fibroelastotic stromal change, and edema. There is incidental oxyntic metaplasia of benign salivary-type glands. There are no features of epithelial dysplasia or malignancy present. (CRR:gt, 10/12/13) Mali RUND DO Pathologist, Electronic Signature (Case signed 10/12/2013)    ASSESSMENT:  1. Stage I a (T1 B., N0, M0) moderately differentiated adenocarcinoma the lung, 2.3 cm in size with surgery on 04/03/2011. Margins were clear, no LV I was seen, and there is no pleural involvement. 6 lymph nodes were all negative getting stage IA disease.  2. Thrombocytopenia thus far consistent with chronic ITP of adults versus drug-induced mild thrombocytopenia. He is on too many medications to withdraw them completely. There is no evidence on his May 2013 CT scan of splenomegaly or cirrhosis. Stable.  3. Obesity  4. Intermittent diarrhea treated by GI  5. Bladder cancer followed by Dr. Rosana Hoes  6. MI x2 the last in May 2012  7. Abdominal hernia secondary to abdominal aortic aneurysm surgery in 2008  8. Double vision in the past which has not been a recent issue  9. History of skin cancer  10. Chronic renal disease, grade 3, followed by nephrologist  11. Elevated glucose, followed by Dr. Wolfgang Phoenix. 12. S/P bronchoscopy by Dr. Roxan Hockey on 10/10/2013 with biopsies and they are negative.  Patient Active Problem List   Diagnosis Date Noted  . History of colonic polyps   . Diverticulosis of  colon without hemorrhage   . Essential and other specified forms of tremor 02/23/2013  . Diplopia 02/23/2013  . Diabetic polyneuropathy ([place]) 02/23/2013  . Abnormality of gait 02/23/2013  . COPD exacerbation ([place]) 01/31/2013  . Fracture of right distal radius 12/14/2012  . Diabetes ([place]) 11/30/2012  . ITP (idiopathic thrombocytopenic purpura) 09/07/2012  . LLQ pain 11/13/2011  . Ventral hernia 10/28/2011  . AAA (abdominal aortic aneurysm) ([place])   . Tobacco abuse   . Diarrhea 10/22/2011  . Tubular adenoma of colon   . Obstructive sleep apnea 03/27/2011  . Fasting hyperglycemia 03/27/2011  . Nephrolithiasis 03/27/2011  . Bladder carcinoma ([place]) 03/27/2011  . COPD (chronic obstructive pulmonary disease) ([place]) 02/24/2011  . Adenocarcinoma of right lung ([place]) 02/24/2011  . Arteriosclerotic cardiovascular disease (ASCVD) 01/06/2011  . Hypertension 01/06/2011  . CKD (chronic kidney disease) stage 3, GFR 30-59 ml/min 01/06/2011     PLAN:   I reviewed the CT scans with the patient and his wife. He is due for  repeat imaging in June, we have ordered that today. We will continue with ongoing CT surveillance as recommended in accordance with the NCCN guidelines.  We addressed his ongoing tobacco use and its relationship to his bladder cancer and lung cancer. He is aware of this but is not interested in discontinuing smoking.  In regards to his chronic thrombocytopenia, counts are stable.  Follow up in 6 months   I will call in an antibiotic for his current drainage and congestion symptoms, Zithromax. He uses [facility].  Orders Placed This Encounter  Procedures  . CT Chest W Contrast    JH/AMY     ISTAT CREAT IF NEEDED    [employer] MEDICARE     Standing Status: Future     Number of Occurrences:      Standing Expiration Date: 06/28/2016    Order Specific Question:  If indicated for the ordered procedure, I authorize the administration of contrast media per Radiology protocol     Answer:  Yes    Order Specific Question:  Reason for Exam (SYMPTOM  OR DIAGNOSIS REQUIRED)    Answer:  lung cancer, restaging    Order Specific Question:  Preferred imaging location?    Answer:  [facility]    All questions were answered. The patient knows to call the clinic with any problems, questions or concerns. We can certainly see the patient much sooner if necessary.  This document serves as a record of services personally performed by Ancil Linsey, MD. It was created on her behalf by Toni Amend, a trained medical scribe. The creation of this record is based on the scribe's personal observations and the provider's statements to them. This document has been checked and approved by the attending provider.  I have reviewed the above documentation for accuracy and completeness, and I agree with the above.  This note was electronically signed.  Molli Hazard, MD  06/29/2015

## 2015-06-29 NOTE — Patient Instructions (Addendum)
[place] at [facility] Discharge Instructions  RECOMMENDATIONS MADE BY THE CONSULTANT AND ANY TEST RESULTS WILL BE SENT TO YOUR REFERRING PHYSICIAN.   Exam completed by Dr Whitney Muse today Z-pack sent to your pharmacy for your congestion please go by and pick it up Make sure you finish taking all of these CT chest scan in June  Return to see the doctor 6 months Please call the clinic if you have any questions or concerns   Thank you for choosing [place] at [facility] to provide your oncology and hematology care.  To afford each patient quality time with our provider, please arrive at least 15 minutes before your scheduled appointment time.    You need to re-schedule your appointment should you arrive 10 or more minutes late.  We strive to give you quality time with our providers, and arriving late affects you and other patients whose appointments are after yours.  Also, if you no show three or more times for appointments you may be dismissed from the clinic at the providers discretion.     Again, thank you for choosing [facility].  Our hope is that these requests will decrease the amount of time that you wait before being seen by our physicians.       _____________________________________________________________  Should you have questions after your visit to [facility], please contact our office at [phone] between the hours of 8:30 a.m. and 4:30 p.m.  Voicemails left after 4:30 p.m. will not be returned until the following business day.  For prescription refill requests, have your pharmacy contact our office.

## 2015-07-04 DIAGNOSIS — I151 Hypertension secondary to other renal disorders: Secondary | ICD-10-CM | POA: Diagnosis not present

## 2015-07-04 DIAGNOSIS — Z01810 Encounter for preprocedural cardiovascular examination: Secondary | ICD-10-CM | POA: Diagnosis not present

## 2015-07-04 DIAGNOSIS — C672 Malignant neoplasm of lateral wall of bladder: Secondary | ICD-10-CM | POA: Diagnosis not present

## 2015-07-04 DIAGNOSIS — I447 Left bundle-branch block, unspecified: Secondary | ICD-10-CM | POA: Diagnosis not present

## 2015-07-04 DIAGNOSIS — N2889 Other specified disorders of kidney and ureter: Secondary | ICD-10-CM | POA: Diagnosis not present

## 2015-07-04 DIAGNOSIS — E119 Type 2 diabetes mellitus without complications: Secondary | ICD-10-CM | POA: Diagnosis not present

## 2015-07-05 DIAGNOSIS — I447 Left bundle-branch block, unspecified: Secondary | ICD-10-CM | POA: Diagnosis not present

## 2015-07-09 DIAGNOSIS — N189 Chronic kidney disease, unspecified: Secondary | ICD-10-CM | POA: Diagnosis not present

## 2015-07-14 DIAGNOSIS — J188 Other pneumonia, unspecified organism: Secondary | ICD-10-CM | POA: Diagnosis not present

## 2015-07-17 ENCOUNTER — Telehealth: Payer: Self-pay | Admitting: Family Medicine

## 2015-07-17 NOTE — Telephone Encounter (Signed)
Patient was recently diagnosed with pneumonia at the urgent care in [place] about a week ago.  Patients son wants to know if he needs to come in to our office and be rechecked or go to the hospital?   Also, he fell down and they x-rayed his hip but did not see anything broken.

## 2015-07-17 NOTE — Telephone Encounter (Signed)
Discussed with son. Transferred to front to schedule ov tomorrow am. If worse go to ED. Son verablized understanding.

## 2015-07-17 NOTE — Telephone Encounter (Signed)
Went to urgent care on dec 24th. No fever for the past 2 -3 days. On antibiotics bid for 10 days.  Feeling some better. Family concerned because he is not wanting to get out of bed and walk around. Some sob when moving around. When should he come in for a recheck.

## 2015-07-17 NOTE — Telephone Encounter (Signed)
Not unusual three d into pneumonia rx to still be wk, if family thinks he is worse then should go to ER , if not, f u visit here late tomorrow morn

## 2015-07-18 ENCOUNTER — Encounter: Payer: Self-pay | Admitting: Family Medicine

## 2015-07-18 ENCOUNTER — Ambulatory Visit (INDEPENDENT_AMBULATORY_CARE_PROVIDER_SITE_OTHER): Payer: Medicare Other | Admitting: Family Medicine

## 2015-07-18 VITALS — BP 132/86 | Ht 72.0 in | Wt 270.0 lb

## 2015-07-18 DIAGNOSIS — J189 Pneumonia, unspecified organism: Secondary | ICD-10-CM

## 2015-07-18 NOTE — Progress Notes (Signed)
   Subjective:    Patient ID: Kyle [name], male    DOB: [birth year], 81 y.o.   MRN: [id number]  HPI Patient arrives to follow up on recent pneumonia-dx and treated at urgent care Sat. Patient given Augmentin at urgent care    Seen in urgent care. X-ray confirmed pneumonia. No obvious wheezing at that time. Next  Start on Augmentin. Handling it well. No obvious side effects with it.  Some shortness of breath with exertion. Oxygen running mostly in the 90s during the day.  Patient took a fall struck right side of face along with right sided chest wall. Had chest x-ray since then which showed no fractures.  Diminished energy and appetite fair sugars still running good  Review of Systems No high fevers some persistent cough no vomiting no diarrhea no change in bowel habits some weakness ROS otherwise negative    Objective:   Physical Exam  Alert no acute distress H&T mom his congestion lungs no wheezes currently no crackles no tachypnea heart rare rhythm O2 sat 93%      Assessment & Plan:  Complete hospital records reviewed. As his records of prior visits along with records a visit with specialist. Next  Impression pneumonia. Exacerbated by COPD diabetes and general weakness discussed at great length plan maintain antibiotics. 25 minutes spent most in discussion and review of records and making again plan. We'll do x-raying couple weeks. Warning signs discussed WSL

## 2015-07-20 DIAGNOSIS — J449 Chronic obstructive pulmonary disease, unspecified: Secondary | ICD-10-CM | POA: Diagnosis not present

## 2015-08-01 ENCOUNTER — Ambulatory Visit (HOSPITAL_COMMUNITY)
Admission: RE | Admit: 2015-08-01 | Discharge: 2015-08-01 | Disposition: A | Discharge status: Home / Self Care | Payer: Medicare Other | Source: Ambulatory Visit | Attending: Family Medicine | Admitting: Family Medicine

## 2015-08-01 DIAGNOSIS — J189 Pneumonia, unspecified organism: Secondary | ICD-10-CM | POA: Diagnosis not present

## 2015-08-13 ENCOUNTER — Other Ambulatory Visit: Payer: Self-pay | Admitting: Family Medicine

## 2015-08-20 DIAGNOSIS — J449 Chronic obstructive pulmonary disease, unspecified: Secondary | ICD-10-CM | POA: Diagnosis not present

## 2015-08-22 ENCOUNTER — Ambulatory Visit: Payer: Medicare Other | Admitting: Family Medicine

## 2015-08-23 ENCOUNTER — Ambulatory Visit: Payer: Self-pay

## 2015-08-23 ENCOUNTER — Other Ambulatory Visit: Payer: Self-pay

## 2015-08-23 DIAGNOSIS — E119 Type 2 diabetes mellitus without complications: Secondary | ICD-10-CM | POA: Diagnosis not present

## 2015-08-23 DIAGNOSIS — H538 Other visual disturbances: Secondary | ICD-10-CM | POA: Diagnosis not present

## 2015-08-23 DIAGNOSIS — H2513 Age-related nuclear cataract, bilateral: Secondary | ICD-10-CM | POA: Diagnosis not present

## 2015-08-23 LAB — HM DIABETES EYE EXAM

## 2015-08-23 NOTE — Patient Outreach (Signed)
[facility]) Care Management  08/23/2015  ARMANY MANO [birth year] [id number]   Telephone call to patient for monthly call. No answer HIPAA compliant voice message left.    Plan: RN Coach will attempt patient within 1-2 weeks.    [name], RN, MSN [place] [phone]

## 2015-08-24 ENCOUNTER — Encounter: Payer: Self-pay | Admitting: Family Medicine

## 2015-08-24 ENCOUNTER — Ambulatory Visit (INDEPENDENT_AMBULATORY_CARE_PROVIDER_SITE_OTHER): Payer: Medicare Other | Admitting: Family Medicine

## 2015-08-24 VITALS — BP 122/70 | Ht 72.0 in | Wt 270.0 lb

## 2015-08-24 DIAGNOSIS — E785 Hyperlipidemia, unspecified: Secondary | ICD-10-CM | POA: Diagnosis not present

## 2015-08-24 DIAGNOSIS — I10 Essential (primary) hypertension: Secondary | ICD-10-CM

## 2015-08-24 DIAGNOSIS — E0842 Diabetes mellitus due to underlying condition with diabetic polyneuropathy: Secondary | ICD-10-CM | POA: Diagnosis not present

## 2015-08-24 DIAGNOSIS — N183 Chronic kidney disease, stage 3 unspecified: Secondary | ICD-10-CM

## 2015-08-24 DIAGNOSIS — Z79899 Other long term (current) drug therapy: Secondary | ICD-10-CM | POA: Diagnosis not present

## 2015-08-24 DIAGNOSIS — E119 Type 2 diabetes mellitus without complications: Secondary | ICD-10-CM

## 2015-08-24 LAB — POCT GLYCOSYLATED HEMOGLOBIN (HGB A1C): Hemoglobin A1C: 7.2

## 2015-08-24 NOTE — Progress Notes (Signed)
   Subjective:    Patient ID: Kyle [name], male    DOB: 15-Aug-1933, 82 y.o.   MRN: [id number]  Diabetes He presents for his follow-up diabetic visit. He has type 2 diabetes mellitus. Current diabetic treatment includes diet. Exercise: stays active. Home blood sugar record trend: 120's to 130. He sees a podiatrist.Eye exam is current (yesterday).   BP has been running high. Claims compliance with blood pressure medicines. No obvious side effects. Meds reviewed today.  Compliant with lipid medicine. No obvious side effects. Is try to cut the fats down and diet. No obvious side effects from medication.  Continues to see numerous specialists including nephrologist keep an eye on kidney function.  Slight increase cough recent days nonproductive no fever or chills  Notes fasting sugars have gone up somewhat of late often getting numbers in the mid 100s.    Results for orders placed or performed in visit on 08/24/15  POCT glycosylated hemoglobin (Hb A1C)  Result Value Ref Range   Hemoglobin A1C 7.2   HM DIABETES EYE EXAM  Result Value Ref Range   HM Diabetic Eye Exam No Retinopathy No Retinopathy    Review of Systems No headache no fevers no chest pain no back pain no abdominal pain no change in bowel habits    Objective:   Physical Exam  Alert vital stable blood pressure excellent on repeat H&T normal lungs clear heart rare rhythm ankles without edema diabetic foot exam normal      Assessment & Plan:  Impression 1 hypertension discussed with control is excellent-year-old maintain same meds #2 type 2 diabetes A1c up somewhat not enough for medicines discussed #3 slight exacerbation of COPD encouraged to use inhaler no antibiotics rationale discussed #4 hyperlipidemia status and certain time check blood work plan appropriate blood work diet exercise discussed meds revealed recheck in several months WSL

## 2015-08-27 ENCOUNTER — Ambulatory Visit: Payer: Medicare Other | Admitting: Nurse Practitioner

## 2015-08-27 ENCOUNTER — Ambulatory Visit (INDEPENDENT_AMBULATORY_CARE_PROVIDER_SITE_OTHER): Payer: Medicare Other | Admitting: Neurology

## 2015-08-27 ENCOUNTER — Encounter: Payer: Self-pay | Admitting: Neurology

## 2015-08-27 VITALS — BP 122/76 | HR 71 | Ht 72.0 in | Wt 272.0 lb

## 2015-08-27 DIAGNOSIS — R269 Unspecified abnormalities of gait and mobility: Secondary | ICD-10-CM

## 2015-08-27 DIAGNOSIS — G250 Essential tremor: Secondary | ICD-10-CM | POA: Diagnosis not present

## 2015-08-27 DIAGNOSIS — E0842 Diabetes mellitus due to underlying condition with diabetic polyneuropathy: Secondary | ICD-10-CM

## 2015-08-27 DIAGNOSIS — H532 Diplopia: Secondary | ICD-10-CM | POA: Diagnosis not present

## 2015-08-27 DIAGNOSIS — Z79899 Other long term (current) drug therapy: Secondary | ICD-10-CM | POA: Diagnosis not present

## 2015-08-27 DIAGNOSIS — I10 Essential (primary) hypertension: Secondary | ICD-10-CM | POA: Diagnosis not present

## 2015-08-27 DIAGNOSIS — E785 Hyperlipidemia, unspecified: Secondary | ICD-10-CM | POA: Diagnosis not present

## 2015-08-27 NOTE — Patient Instructions (Signed)
Tremor °A tremor is trembling or shaking that you cannot control. Most tremors affect the hands or arms. Tremors can also affect the head, vocal cords, face, and other parts of the body. There are many types of tremors. Common types include:  °· Essential tremor. These usually occur in people over the age of 40. It may run in families and can happen in otherwise healthy people.   °· Resting tremor. These occur when the muscles are at rest, such as when your hands are resting in your lap. People with Parkinson disease often have resting tremors.   °· Postural tremor. These occur when you try to hold a pose, such as keeping your hands outstretched.   °· Kinetic tremor. These occur during purposeful movement, such as trying to touch a finger to your nose.   °· Task-specific tremor. These may occur when you perform tasks such as handwriting, speaking, or standing.   °· Psychogenic tremor. These dramatically lessen or disappear when you are distracted. They can happen in people of all ages.   °Some types of tremors have no known cause. Tremors can also be a symptom of nervous system problems (neurological disorders) that may occur with aging. Some tremors go away with treatment while others do not.  °HOME CARE INSTRUCTIONS °Watch your tremor for any changes. The following actions may help to lessen any discomfort you are feeling: °· Take medicines only as directed by your health care provider.   °· Limit alcohol intake to no more than 1 drink per day for nonpregnant women and 2 drinks per day for men. One drink equals 12 oz of beer, 5 oz of wine, or 1½ oz of hard liquor. °· Do not use any tobacco products, including cigarettes, chewing tobacco, or electronic cigarettes. If you need help quitting, ask your health care provider.   °· Avoid extreme heat or cold.    °· Limit the amount of caffeine you consume as directed by your health care provider.   °· Try to get 8 hours of sleep each night. °· Find ways to manage your  stress, such as meditation or yoga. °· Keep all follow-up visits as directed by your health care provider. This is important. °SEEK MEDICAL CARE IF: °· You start having a tremor after starting a new medicine. °· You have tremor with other symptoms such as: °¨ Numbness. °¨ Tingling. °¨ Pain. °¨ Weakness. °· Your tremor gets worse. °· Your tremor interferes with your day-to-day life. °  °This information is not intended to replace advice given to you by your health care provider. Make sure you discuss any questions you have with your health care provider. °  °Document Released: 06/27/2002 Document Revised: 07/28/2014 Document Reviewed: 01/02/2014 °Elsevier Interactive Patient Education ©2016 [employer] ° °

## 2015-08-27 NOTE — Progress Notes (Signed)
Reason for visit: Tremor  Kyle [name] is an 82 y.o. male  History of present illness:  Kyle [name] is a 82 year old right-handed white male with a history of an essential tremor affecting both upper extremities. He also has diabetes with a diabetic peripheral neuropathy. Indicates that he is not bothered by the neuropathy. The tremors may be more prominent some days than others, he will occasionally take some alprazolam for the tremor. He got a prescription filled in April 2016, and he has not yet used up the bottle. He has had some issues with bladder cancer, he is going for a biopsy tomorrow. He does have some mild gait instability, he fell 1 month ago, but he had pneumonia around the time of the fall. He has a history of stage IA lung cancer. He returns for an evaluation. Otherwise, the patient is not getting any medications through our office currently.  Past Medical History  Diagnosis Date  . Arteriosclerotic cardiovascular disease (ASCVD) 1973, 12/2010    S/P NSTEMI secondary to distal RCA/PL lesion, tx medically.  EF of  55%-60% per  echo.  . Diabetes mellitus     Type II  . Thrombocytopenia ([place])   . COPD (chronic obstructive pulmonary disease) ([place])   . AAA (abdominal aortic aneurysm) ([place]) 2004    s/p repair 2004; 4.3 cm infrarenal in 05/2011  . Tobacco abuse     50-pack-year consumption; quit in 12/2010  . Hyperlipidemia   . OSA (obstructive sleep apnea)   . Hypertension   . Benign prostatic hypertrophy     s/p transurethral resection of the prostate  . Obesity   . Tubular adenoma of colon   . Cataract   . Ventral hernia   . Diverticulosis   . Bilateral renal masses     Cystic, more prominent on CT in 12/2010 than 2007; followed by Dr. Rosana Hoes  . ITP (idiopathic thrombocytopenic purpura) 09/07/2012    Chronic ITP of adults versus medication-induced ITP.  Stable  . Insomnia   . Bladder cancer [facility]) 1996    Transurethral resection of the bladder + chemotherapy/BCG as  premed  . Adenocarcinoma, lung ([place]) 01/2011    transthoracic FNA; resection of the superior segment of the RLL in 03/2011; negative nodes; no chemotherapy nor radiation planned  . Chronic kidney disease     Creatinine 1.4 on discharge 12/20/2100; proteinuria; normal renal ultrasound in 2010; recent creatinines of 1.7-2.; Bilateral cystic renal masses by CT in 2011  . Nephrolithiasis 2012    ARF in 01/2011 due to obstructing nephrolithiasis  . Arm fracture     right arm  . Essential and other specified forms of tremor 02/23/2013  . Diplopia 02/23/2013  . Polyneuropathy in diabetes(357.2) 02/23/2013  . Abnormality of gait 02/23/2013  . Hx of Clostridium difficile infection   . Adenocarcinoma of right lung ([place]) 02/24/2011    Ct A/P 2012:  2cm lung mass RLL PET 8119:  Hypermetabolic RLL mass, no other hypermetabolic areas. TTNA 02/2011:  Adenocarcinoma, markers c/w lung origin Right lower lobe superior segmentectomy. 04/01/2011 Dr. Arlyce Dice   . Coronary artery disease   . Myocardial infarction ([place])   . Cough     thick phlegm    Past Surgical History  Procedure Laterality Date  . Transurethral resection of prostate    . Cystectomy    . Tonsillectomy    . Colonoscopy  03/19/2010    Dr. Gala Romney -(poor prep) Anal papilla, rectal hyperplastic polyp, tubular adenoma removed splenic  flexure, left-sided diverticula  . Wedge resection  04/2011    carcinoma of lung  . Colonoscopy  11/28/2004    RMR:  Diminutive rectal and left colon polyps as described above, cold  biopsied/removed/  Left sided diverticula. The remainder of the colonic mucosa appeared normal.  . Colonoscopy   09/15/01    RMR: Multiple diminutive polyps destroyed with dermolysis as described above/ Multiple small polyps on stalks in the colon resected with snare cautery/ Scattered pan colonic diverticulum/ The remainder of the colonic mucosa appeared normal  . Abdominal aortic aneurysm repair  2004  . Cardiac catheterization    . Lung lobectomy     . Video bronchoscopy with endobronchial navigation N/A 10/10/2013    Procedure: VIDEO BRONCHOSCOPY WITH ENDOBRONCHIAL NAVIGATION;  Surgeon: Melrose Nakayama, MD;  Location: [facility];  Service: Thoracic;  Laterality: N/A;  NO BLOOD THINNERS BUT PATIENT HAS ITP  . Cystoscopy  04/2014  . Flexible sigmoidoscopy  2014    Dr. Olevia Perches: tubular adenoma, negative stool studies   . Colonoscopy N/A 05/01/2015    Procedure: COLONOSCOPY;  Surgeon: Daneil Dolin, MD;  [facility];  Service: Endoscopy;  Laterality: N/A;  1115  . Cystostomy w/ bladder biopsy      Family History  Problem Relation Age of Onset  . Aortic aneurysm Mother   . Emphysema Father     smoker  . Clotting disorder Father   . Arthritis Father   . Hypertension Father   . Diabetes Father   . Aortic aneurysm Father   . Tremor Father   . Stroke Paternal Grandmother   . Other Paternal Grandfather     brain aneurysm  . Colon cancer Cousin     Social history:  reports that he quit smoking about 2 years ago. His smoking use included Cigarettes. He started smoking about 60 years ago. He has a 50 pack-year smoking history. He has never used smokeless tobacco. He reports that he does not drink alcohol or use illicit drugs.    Allergies  Allergen Reactions  . Codeine Anaphylaxis  . Etodolac     dizziness    Medications:  Prior to Admission medications   Medication Sig Start Date End Date Taking? Authorizing Provider  acetaminophen (TYLENOL) 500 MG tablet Take 500 mg by mouth every 6 (six) hours as needed for mild pain.    Yes Historical Provider, MD  albuterol (PROAIR HFA) 108 (90 BASE) MCG/ACT inhaler Inhale 2 puffs into the lungs every 6 (six) hours as needed for wheezing. 11/15/14 05/06/18 Yes Mikey Kirschner, MD  ALPRAZolam Duanne Moron) 0.5 MG tablet Take 1 tablet (0.5 mg total) by mouth 2 (two) times daily as needed for anxiety. 11/15/14  Yes Mikey Kirschner, MD  aspirin EC 81 MG tablet Take 81 mg by mouth daily.     Yes Historical Provider, MD  calcitRIOL (ROCALTROL) 0.25 MCG capsule  05/20/15  Yes Historical Provider, MD  cetirizine (ZYRTEC) 10 MG tablet Take 1 tablet (10 mg total) by mouth daily. 11/15/14  Yes Mikey Kirschner, MD  citalopram (CELEXA) 20 MG tablet Take 1 tablet (20 mg total) by mouth daily. 11/15/14  Yes Mikey Kirschner, MD  fluticasone (FLONASE) 50 MCG/ACT nasal spray Place 2 sprays into both nostrils daily as needed for allergies. 11/15/14  Yes Mikey Kirschner, MD  ipratropium (ATROVENT) 0.06 % nasal spray Place 2 sprays into both nostrils 4 (four) times daily. Patient taking differently: Place 2 sprays into both nostrils  as needed.  11/15/14  Yes Mikey Kirschner, MD  lisinopril (PRINIVIL,ZESTRIL) 5 MG tablet TAKE ONE (1) TABLET BY MOUTH EVERY DAY 08/13/15  Yes Mikey Kirschner, MD  metoprolol (LOPRESSOR) 50 MG tablet TAKE ONE TABLET TWICE DAILY 06/04/15  Yes Mikey Kirschner, MD  niacin (NIASPAN) 500 MG CR tablet Take 2 tablets (1,000 mg total) by mouth at bedtime. 11/15/14  Yes Mikey Kirschner, MD  nitroGLYCERIN (NITROSTAT) 0.4 MG SL tablet Place 1 tablet (0.4 mg total) under the tongue as needed. Place 0.4 mg under the tongue every 5 (five) minutes as needed. Call MD if need more than 2 03/09/13  Yes Mikey Kirschner, MD  pravastatin (PRAVACHOL) 80 MG tablet TAKE ONE CAPSULE BY MOUTH DAILY 06/29/15  Yes Mikey Kirschner, MD  tiotropium (SPIRIVA HANDIHALER) 18 MCG inhalation capsule INHALE ONE DOSE BY MOUTH ONCE DAILY 11/15/14  Yes Mikey Kirschner, MD    ROS:  Out of a complete 14 system review of symptoms, the patient complains only of the following symptoms, and all other reviewed systems are negative.  Decreased activity, decreased appetite, fatigue Eye discharge, eye itching, double vision Bruising easily  Blood pressure 122/76, pulse 71, height 6' (1.829 m), weight 272 lb (123.378 kg).  Physical Exam  General: The patient is alert and cooperative at the time of the examination.  The patient is moderately to markedly obese.  Skin: 2+ edema below the knees is noted bilaterally.   Neurologic Exam  Mental status: The patient is alert and oriented x 3 at the time of the examination. The patient has apparent normal recent and remote memory, with an apparently normal attention span and concentration ability.   Cranial nerves: Facial symmetry is present. Speech is normal, no aphasia or dysarthria is noted. Extraocular movements are full. Visual fields are full.  Motor: The patient has good strength in all 4 extremities.  Sensory examination: Soft touch sensation is symmetric on the face, arms, and legs. There is no evidence of a stocking pattern pinprick sensory deficit in the lower extremities.  Coordination: The patient has good finger-nose-finger and heel-to-shin bilaterally. The patient does have bilateral intention tremors with the upper extremities with finger-nose-finger.  Gait and station: The patient has a wide-based, slightly unsteady gait. Tandem gait was not attempted. Romberg is negative.  Reflexes: Deep tendon reflexes are symmetric, but are depressed.   Assessment/Plan:  1. Essential tremor  2. Diabetes, mild diabetic peripheral neuropathy  3. Chronic diplopia  The patient is having good days and bad days with the tremor. Overall, he does not require any significant therapy for the tremor. The patient has no significant symptoms from his neuropathy. He reports that he does have some double vision and blurred vision at distances. He has seen Dr. Sanda Klein in the past, he has prisms on the glasses which have offered only a modest benefit. At this point, our office is not prescribing any medications for him. He will follow-up through this office on an as-needed basis.  Jill Alexanders MD 08/27/2015 5:26 PM  [facility] [address] [place], [place] [address]  Phone [phone] Fax [phone]

## 2015-08-28 ENCOUNTER — Ambulatory Visit: Payer: Medicare Other | Admitting: Family Medicine

## 2015-08-28 DIAGNOSIS — Z79899 Other long term (current) drug therapy: Secondary | ICD-10-CM | POA: Diagnosis not present

## 2015-08-28 DIAGNOSIS — E559 Vitamin D deficiency, unspecified: Secondary | ICD-10-CM | POA: Diagnosis not present

## 2015-08-28 DIAGNOSIS — Z902 Acquired absence of lung [part of]: Secondary | ICD-10-CM | POA: Diagnosis not present

## 2015-08-28 DIAGNOSIS — Z8679 Personal history of other diseases of the circulatory system: Secondary | ICD-10-CM | POA: Diagnosis not present

## 2015-08-28 DIAGNOSIS — Z7982 Long term (current) use of aspirin: Secondary | ICD-10-CM | POA: Diagnosis not present

## 2015-08-28 DIAGNOSIS — D693 Immune thrombocytopenic purpura: Secondary | ICD-10-CM | POA: Diagnosis not present

## 2015-08-28 DIAGNOSIS — I129 Hypertensive chronic kidney disease with stage 1 through stage 4 chronic kidney disease, or unspecified chronic kidney disease: Secondary | ICD-10-CM | POA: Diagnosis not present

## 2015-08-28 DIAGNOSIS — I10 Essential (primary) hypertension: Secondary | ICD-10-CM | POA: Diagnosis not present

## 2015-08-28 DIAGNOSIS — C672 Malignant neoplasm of lateral wall of bladder: Secondary | ICD-10-CM | POA: Diagnosis not present

## 2015-08-28 DIAGNOSIS — N3289 Other specified disorders of bladder: Secondary | ICD-10-CM | POA: Diagnosis not present

## 2015-08-28 DIAGNOSIS — C680 Malignant neoplasm of urethra: Secondary | ICD-10-CM | POA: Diagnosis not present

## 2015-08-28 DIAGNOSIS — Z8551 Personal history of malignant neoplasm of bladder: Secondary | ICD-10-CM | POA: Diagnosis not present

## 2015-08-28 DIAGNOSIS — J449 Chronic obstructive pulmonary disease, unspecified: Secondary | ICD-10-CM | POA: Diagnosis not present

## 2015-08-28 DIAGNOSIS — C61 Malignant neoplasm of prostate: Secondary | ICD-10-CM | POA: Diagnosis not present

## 2015-08-28 DIAGNOSIS — N400 Enlarged prostate without lower urinary tract symptoms: Secondary | ICD-10-CM | POA: Diagnosis not present

## 2015-08-28 DIAGNOSIS — R896 Abnormal cytological findings in specimens from other organs, systems and tissues: Secondary | ICD-10-CM | POA: Diagnosis not present

## 2015-08-28 DIAGNOSIS — E785 Hyperlipidemia, unspecified: Secondary | ICD-10-CM | POA: Diagnosis not present

## 2015-08-28 DIAGNOSIS — Z86010 Personal history of colonic polyps: Secondary | ICD-10-CM | POA: Diagnosis not present

## 2015-08-28 DIAGNOSIS — Z9981 Dependence on supplemental oxygen: Secondary | ICD-10-CM | POA: Diagnosis not present

## 2015-08-28 DIAGNOSIS — H532 Diplopia: Secondary | ICD-10-CM | POA: Diagnosis not present

## 2015-08-28 DIAGNOSIS — N183 Chronic kidney disease, stage 3 (moderate): Secondary | ICD-10-CM | POA: Diagnosis not present

## 2015-08-28 DIAGNOSIS — I2510 Atherosclerotic heart disease of native coronary artery without angina pectoris: Secondary | ICD-10-CM | POA: Diagnosis not present

## 2015-08-28 DIAGNOSIS — E1122 Type 2 diabetes mellitus with diabetic chronic kidney disease: Secondary | ICD-10-CM | POA: Diagnosis not present

## 2015-08-28 DIAGNOSIS — J309 Allergic rhinitis, unspecified: Secondary | ICD-10-CM | POA: Diagnosis not present

## 2015-08-28 DIAGNOSIS — Z85118 Personal history of other malignant neoplasm of bronchus and lung: Secondary | ICD-10-CM | POA: Diagnosis not present

## 2015-08-28 DIAGNOSIS — I252 Old myocardial infarction: Secondary | ICD-10-CM | POA: Diagnosis not present

## 2015-08-28 DIAGNOSIS — G4733 Obstructive sleep apnea (adult) (pediatric): Secondary | ICD-10-CM | POA: Diagnosis not present

## 2015-08-28 LAB — HEPATIC FUNCTION PANEL
ALBUMIN: 4 g/dL (ref 3.5–4.8)
ALT: 15 IU/L (ref 0–44)
AST: 12 IU/L (ref 0–40)
Alkaline Phosphatase: 72 IU/L (ref 39–117)
Bilirubin Total: 0.9 mg/dL (ref 0.0–1.2)
Bilirubin, Direct: 0.23 mg/dL (ref 0.00–0.40)
Total Protein: 6.7 g/dL (ref 6.0–8.5)

## 2015-08-28 LAB — LIPID PANEL
CHOL/HDL RATIO: 5 ratio (ref 0.0–5.0)
CHOLESTEROL TOTAL: 144 mg/dL (ref 100–199)
HDL: 29 mg/dL — ABNORMAL LOW (ref >39)
LDL CALC: 80 mg/dL (ref 0–99)
Triglycerides: 175 mg/dL — ABNORMAL HIGH (ref 0–149)
VLDL CHOLESTEROL CAL: 35 mg/dL (ref 5–40)

## 2015-08-28 LAB — BASIC METABOLIC PANEL
BUN/Creatinine Ratio: 14 (ref 10–22)
BUN: 27 mg/dL (ref 8–27)
CALCIUM: 9.2 mg/dL (ref 8.6–10.2)
CHLORIDE: 104 mmol/L (ref 96–106)
CO2: 24 mmol/L (ref 18–29)
Creatinine, Ser: 1.91 mg/dL — ABNORMAL HIGH (ref 0.76–1.27)
GFR calc Af Amer: 38 mL/min/{1.73_m2} — ABNORMAL LOW (ref >59)
GFR, EST NON AFRICAN AMERICAN: 33 mL/min/{1.73_m2} — AB (ref >59)
Glucose: 142 mg/dL — ABNORMAL HIGH (ref 65–99)
POTASSIUM: 3.9 mmol/L (ref 3.5–5.2)
Sodium: 143 mmol/L (ref 134–144)

## 2015-08-30 ENCOUNTER — Other Ambulatory Visit: Payer: Self-pay | Admitting: Urology

## 2015-08-30 ENCOUNTER — Other Ambulatory Visit: Payer: Self-pay

## 2015-08-30 DIAGNOSIS — Z8551 Personal history of malignant neoplasm of bladder: Secondary | ICD-10-CM

## 2015-08-30 DIAGNOSIS — Z85118 Personal history of other malignant neoplasm of bronchus and lung: Secondary | ICD-10-CM

## 2015-08-30 DIAGNOSIS — C680 Malignant neoplasm of urethra: Secondary | ICD-10-CM

## 2015-08-30 NOTE — Patient Outreach (Signed)
[facility]) Care Management  [place]  08/30/2015   Kyle [name] 1934/06/16 [id number]  Subjective: Telephone call to patient for every other month outreach.  Patient reports he has done ok but reports a cough that has lasted about one week.  However, patient reports that he feels it is better.  Discussed with patient when to notify physician concerning cough.  He verbalized understanding.  Patient reports that he had a fall in December but no injury.  Discussed with patient falls safety. He verbalized understanding.  Patient shares that he had a biopsy of his bladder Tuesday and he is waiting for results as he has a history of bladder cancer.  Patient reports he saw his primary doctor about 1 week ago and that his A1c was 7.1 but the doctor did not put him on anything presently.  He states that he is going to work on his diet.  Discussed with patient importance of maintaining a low carbohydrate diet and limiting his sweets.  He verbalized understanding. Patient has not been checking his blood sugars regularly.  Encouraged patient to check his sugars regularly and record.  He verbalized understanding.    Objective:   Current Medications:  Current Outpatient Prescriptions  Medication Sig Dispense Refill  . acetaminophen (TYLENOL) 500 MG tablet Take 500 mg by mouth every 6 (six) hours as needed for mild pain.     Marland Kitchen albuterol (PROAIR HFA) 108 (90 BASE) MCG/ACT inhaler Inhale 2 puffs into the lungs every 6 (six) hours as needed for wheezing. 1 Inhaler 5  . ALPRAZolam (XANAX) 0.5 MG tablet Take 1 tablet (0.5 mg total) by mouth 2 (two) times daily as needed for anxiety. 60 tablet 5  . aspirin EC 81 MG tablet Take 81 mg by mouth daily.     . calcitRIOL (ROCALTROL) 0.25 MCG capsule     . cetirizine (ZYRTEC) 10 MG tablet Take 1 tablet (10 mg total) by mouth daily. 30 tablet 5  . citalopram (CELEXA) 20 MG tablet Take 1 tablet (20 mg total) by mouth daily. 90 tablet 1  .  fluticasone (FLONASE) 50 MCG/ACT nasal spray Place 2 sprays into both nostrils daily as needed for allergies. 16 g 5  . ipratropium (ATROVENT) 0.06 % nasal spray Place 2 sprays into both nostrils 4 (four) times daily. (Patient taking differently: Place 2 sprays into both nostrils as needed. ) 15 mL 5  . lisinopril (PRINIVIL,ZESTRIL) 5 MG tablet TAKE ONE (1) TABLET BY MOUTH EVERY DAY 90 tablet 1  . metoprolol (LOPRESSOR) 50 MG tablet TAKE ONE TABLET TWICE DAILY 180 tablet 1  . nitroGLYCERIN (NITROSTAT) 0.4 MG SL tablet Place 1 tablet (0.4 mg total) under the tongue as needed. Place 0.4 mg under the tongue every 5 (five) minutes as needed. Call MD if need more than 2 3 tablet 5  . pravastatin (PRAVACHOL) 80 MG tablet TAKE ONE CAPSULE BY MOUTH DAILY 90 tablet 1  . tiotropium (SPIRIVA HANDIHALER) 18 MCG inhalation capsule INHALE ONE DOSE BY MOUTH ONCE DAILY 90 capsule 5  . niacin (NIASPAN) 500 MG CR tablet Take 2 tablets (1,000 mg total) by mouth at bedtime. 60 tablet 5  . [DISCONTINUED] budesonide-formoterol (SYMBICORT) 160-4.5 MCG/ACT inhaler Inhale 2 puffs into the lungs 2 (two) times daily.       No current facility-administered medications for this visit.    Functional Status:  In your present state of health, do you have any difficulty performing the following activities: 03/05/2015 01/05/2015  Hearing? Kyle [name]  Vision? Y Y  Difficulty concentrating or making decisions? Kyle [name]  Walking or climbing stairs? Y Y  Dressing or bathing? N Y  Doing errands, shopping? N N  Preparing Food and eating ? N N  Using the Toilet? N N  In the past six months, have you accidently leaked urine? N N  Do you have problems with loss of bowel control? N N  Managing your Medications? N N  Managing your Finances? N N  Housekeeping or managing your Housekeeping? N N    Fall/Depression Screening: PHQ 2/9 Scores 08/30/2015 04/30/2015 03/05/2015 01/05/2015 12/08/2014 11/13/2014 06/28/2014  PHQ - 2 Score 0 0 0 0 0 0 0     Assessment: Patient continues to benefit from health coach outreach for disease management.   Plan:  [facility] CM Care Plan Problem One        Most Recent Value   Care Plan Problem One  Diabetes Disease Management needs   Role Documenting the Problem One  Health Coach   THN CM Short Term Goal #1 (0-30 days)  patient will resume checking cbg's at least once weekly over the next 30 days as evidenced by patient documentation and monitor readings   THN CM Short Term Goal #1 Start Date  08/30/15 Kyle [name] continued]   Interventions for Short Term Goal #1  [place] discussed with patient the importance fo checking blood sugars regularly.     THN CM Short Term Goal #3 (0-30 days)  patient will continue to work to improve dietary balance by watching carbohydrates and sugars within the next 30 days.   THN CM Short Term Goal #3 Start Date  08/30/15 [goal continued]   Interventions for Short Tern Goal #3  [place] discussed with patient the importance of watching carbohydrates and limiting sweets.       [place] will send update to physician. RN Health coach will send education information on falls and carbohydrates.  RN Health Coach will contact patient in April for every other month call and patient agrees to next outreach.    Kyle Baseman, RN, MSN [place] [phone]

## 2015-09-02 ENCOUNTER — Encounter: Payer: Self-pay | Admitting: Family Medicine

## 2015-09-03 ENCOUNTER — Other Ambulatory Visit: Payer: Medicare Other

## 2015-09-05 ENCOUNTER — Ambulatory Visit
Admission: RE | Admit: 2015-09-05 | Discharge: 2015-09-05 | Disposition: A | Discharge status: Home / Self Care | Payer: Medicare Other | Source: Ambulatory Visit | Attending: Urology | Admitting: Urology

## 2015-09-05 DIAGNOSIS — Z85118 Personal history of other malignant neoplasm of bronchus and lung: Secondary | ICD-10-CM

## 2015-09-05 DIAGNOSIS — Z8551 Personal history of malignant neoplasm of bladder: Secondary | ICD-10-CM

## 2015-09-05 DIAGNOSIS — C61 Malignant neoplasm of prostate: Secondary | ICD-10-CM | POA: Diagnosis not present

## 2015-09-05 DIAGNOSIS — C680 Malignant neoplasm of urethra: Secondary | ICD-10-CM

## 2015-09-06 DIAGNOSIS — Z85118 Personal history of other malignant neoplasm of bronchus and lung: Secondary | ICD-10-CM | POA: Diagnosis not present

## 2015-09-06 DIAGNOSIS — Z9989 Dependence on other enabling machines and devices: Secondary | ICD-10-CM | POA: Diagnosis not present

## 2015-09-06 DIAGNOSIS — Z7982 Long term (current) use of aspirin: Secondary | ICD-10-CM | POA: Diagnosis not present

## 2015-09-06 DIAGNOSIS — N200 Calculus of kidney: Secondary | ICD-10-CM | POA: Diagnosis not present

## 2015-09-06 DIAGNOSIS — E1122 Type 2 diabetes mellitus with diabetic chronic kidney disease: Secondary | ICD-10-CM | POA: Diagnosis not present

## 2015-09-06 DIAGNOSIS — N183 Chronic kidney disease, stage 3 (moderate): Secondary | ICD-10-CM | POA: Diagnosis not present

## 2015-09-06 DIAGNOSIS — I714 Abdominal aortic aneurysm, without rupture: Secondary | ICD-10-CM | POA: Diagnosis not present

## 2015-09-06 DIAGNOSIS — N281 Cyst of kidney, acquired: Secondary | ICD-10-CM | POA: Diagnosis not present

## 2015-09-06 DIAGNOSIS — Z9889 Other specified postprocedural states: Secondary | ICD-10-CM | POA: Diagnosis not present

## 2015-09-06 DIAGNOSIS — N2581 Secondary hyperparathyroidism of renal origin: Secondary | ICD-10-CM | POA: Diagnosis not present

## 2015-09-06 DIAGNOSIS — J449 Chronic obstructive pulmonary disease, unspecified: Secondary | ICD-10-CM | POA: Diagnosis not present

## 2015-09-06 DIAGNOSIS — E785 Hyperlipidemia, unspecified: Secondary | ICD-10-CM | POA: Diagnosis not present

## 2015-09-06 DIAGNOSIS — Z79899 Other long term (current) drug therapy: Secondary | ICD-10-CM | POA: Diagnosis not present

## 2015-09-06 DIAGNOSIS — I252 Old myocardial infarction: Secondary | ICD-10-CM | POA: Diagnosis not present

## 2015-09-06 DIAGNOSIS — C679 Malignant neoplasm of bladder, unspecified: Secondary | ICD-10-CM | POA: Diagnosis not present

## 2015-09-06 DIAGNOSIS — E559 Vitamin D deficiency, unspecified: Secondary | ICD-10-CM | POA: Diagnosis not present

## 2015-09-06 DIAGNOSIS — I2510 Atherosclerotic heart disease of native coronary artery without angina pectoris: Secondary | ICD-10-CM | POA: Diagnosis not present

## 2015-09-06 DIAGNOSIS — I129 Hypertensive chronic kidney disease with stage 1 through stage 4 chronic kidney disease, or unspecified chronic kidney disease: Secondary | ICD-10-CM | POA: Diagnosis not present

## 2015-09-06 DIAGNOSIS — G4733 Obstructive sleep apnea (adult) (pediatric): Secondary | ICD-10-CM | POA: Diagnosis not present

## 2015-09-10 DIAGNOSIS — D090 Carcinoma in situ of bladder: Secondary | ICD-10-CM | POA: Diagnosis not present

## 2015-09-10 DIAGNOSIS — C675 Malignant neoplasm of bladder neck: Secondary | ICD-10-CM | POA: Diagnosis not present

## 2015-09-18 DIAGNOSIS — J449 Chronic obstructive pulmonary disease, unspecified: Secondary | ICD-10-CM | POA: Diagnosis not present

## 2015-09-24 ENCOUNTER — Other Ambulatory Visit: Payer: Self-pay | Admitting: Family Medicine

## 2015-09-24 NOTE — Telephone Encounter (Signed)
Ok six mo worth 

## 2015-09-26 ENCOUNTER — Encounter: Payer: Self-pay | Admitting: Cardiovascular Disease

## 2015-09-26 ENCOUNTER — Ambulatory Visit (INDEPENDENT_AMBULATORY_CARE_PROVIDER_SITE_OTHER): Payer: Medicare Other | Admitting: Cardiovascular Disease

## 2015-09-26 VITALS — BP 132/70 | HR 63 | Ht 72.0 in | Wt 270.0 lb

## 2015-09-26 DIAGNOSIS — E785 Hyperlipidemia, unspecified: Secondary | ICD-10-CM

## 2015-09-26 DIAGNOSIS — I10 Essential (primary) hypertension: Secondary | ICD-10-CM

## 2015-09-26 DIAGNOSIS — Z9889 Other specified postprocedural states: Secondary | ICD-10-CM

## 2015-09-26 DIAGNOSIS — I2510 Atherosclerotic heart disease of native coronary artery without angina pectoris: Secondary | ICD-10-CM

## 2015-09-26 DIAGNOSIS — Z8679 Personal history of other diseases of the circulatory system: Secondary | ICD-10-CM

## 2015-09-26 DIAGNOSIS — Z87891 Personal history of nicotine dependence: Secondary | ICD-10-CM | POA: Diagnosis not present

## 2015-09-26 DIAGNOSIS — C675 Malignant neoplasm of bladder neck: Secondary | ICD-10-CM | POA: Diagnosis not present

## 2015-09-26 NOTE — Patient Instructions (Signed)

## 2015-09-26 NOTE — Progress Notes (Signed)
Patient ID: Kyle [name], male   DOB: 12-30-1933, 82 y.o.   MRN: [id number]      SUBJECTIVE: The patient presents for routine cardiovascular follow-up. He has a history of coronary artery disease which has been medically managed since a non-STEMI in June 2012. At that time, coronary angiography revealed a 99% stenosis in the distal RCA at the ostium of the second postero-lateral branch which was small and not amenable to percutaneous intervention. 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 There was a long diffuse 70-80% lesion around the proximal bend and was deemed to be a very tortuous artery. The LAD had an ostial 20% lesion in the mid to distal LAD was diffusely diseased with a 50% stenosis. In the proximal portion of the first diagonal there was a 70% stenosis.  He also has a history of AAA s/p repair, CKD stage III, bladder cancer, essential hypertension, diabetes, essential tremor, and lung cancer.  He has been doing well and denies chest pain, activity limiting exertional dyspnea, leg swelling, palpitations, dizziness, and syncope.  Son says he had pneumonia in December but has recovered well.   Review of Systems: As per "subjective", otherwise negative.  Allergies  Allergen Reactions  . Codeine Anaphylaxis  . Etodolac     dizziness    Current Outpatient Prescriptions  Medication Sig Dispense Refill  . acetaminophen (TYLENOL) 500 MG tablet Take 500 mg by mouth every 6 (six) hours as needed for mild pain.     Marland Kitchen albuterol (PROAIR HFA) 108 (90 BASE) MCG/ACT inhaler Inhale 2 puffs into the lungs every 6 (six) hours as needed for wheezing. 1 Inhaler 5  . ALPRAZolam (XANAX) 0.5 MG tablet TAKE ONE TABLET TWICE DAILY AS NEEDED FOR ANXIETY 60 tablet 5  . aspirin EC 81 MG tablet Take 81 mg by mouth daily.     . calcitRIOL (ROCALTROL) 0.25 MCG capsule     . cetirizine (ZYRTEC) 10 MG tablet Take 1 tablet  (10 mg total) by mouth daily. 30 tablet 5  . citalopram (CELEXA) 20 MG tablet Take 1 tablet (20 mg total) by mouth daily. 90 tablet 1  . fluticasone (FLONASE) 50 MCG/ACT nasal spray Place 2 sprays into both nostrils daily as needed for allergies. 16 g 5  . ipratropium (ATROVENT) 0.06 % nasal spray Place 2 sprays into both nostrils 4 (four) times daily. (Patient taking differently: Place 2 sprays into both nostrils as needed. ) 15 mL 5  . lisinopril (PRINIVIL,ZESTRIL) 5 MG tablet TAKE ONE (1) TABLET BY MOUTH EVERY DAY 90 tablet 1  . metoprolol (LOPRESSOR) 50 MG tablet TAKE ONE TABLET TWICE DAILY 180 tablet 1  . niacin (NIASPAN) 500 MG CR tablet Take 2 tablets (1,000 mg total) by mouth at bedtime. 60 tablet 5  . nitroGLYCERIN (NITROSTAT) 0.4 MG SL tablet Place 1 tablet (0.4 mg total) under the tongue as needed. Place 0.4 mg under the tongue every 5 (five) minutes as needed. Call MD if need more than 2 3 tablet 5  . pravastatin (PRAVACHOL) 80 MG tablet TAKE ONE CAPSULE BY MOUTH DAILY 90 tablet 1  . tiotropium (SPIRIVA HANDIHALER) 18 MCG inhalation capsule INHALE ONE DOSE BY MOUTH ONCE DAILY 90 capsule 5  . Vitamin D, Ergocalciferol, (DRISDOL) 50000 units CAPS capsule Take by mouth.    . [DISCONTINUED] budesonide-formoterol (SYMBICORT) 160-4.5 MCG/ACT inhaler Inhale 2 puffs into the lungs 2 (two) times daily.  No current facility-administered medications for this visit.    Past Medical History  Diagnosis Date  . Arteriosclerotic cardiovascular disease (ASCVD) 1973, 12/2010    S/P NSTEMI secondary to distal RCA/PL lesion, tx medically.  EF of  55%-60% per  echo.  . Diabetes mellitus     Type II  . Thrombocytopenia ([place])   . COPD (chronic obstructive pulmonary disease) ([place])   . AAA (abdominal aortic aneurysm) ([place]) 2004    s/p repair 2004; 4.3 cm infrarenal in 05/2011  . Tobacco abuse     50-pack-year consumption; quit in 12/2010  . Hyperlipidemia   . OSA (obstructive sleep apnea)   .  Hypertension   . Benign prostatic hypertrophy     s/p transurethral resection of the prostate  . Obesity   . Tubular adenoma of colon   . Cataract   . Ventral hernia   . Diverticulosis   . Bilateral renal masses     Cystic, more prominent on CT in 12/2010 than 2007; followed by Dr. Rosana Hoes  . ITP (idiopathic thrombocytopenic purpura) 09/07/2012    Chronic ITP of adults versus medication-induced ITP.  Stable  . Insomnia   . Bladder cancer [facility]) 1996    Transurethral resection of the bladder + chemotherapy/BCG as premed  . Adenocarcinoma, lung ([place]) 01/2011    transthoracic FNA; resection of the superior segment of the RLL in 03/2011; negative nodes; no chemotherapy nor radiation planned  . Chronic kidney disease     Creatinine 1.4 on discharge 12/20/2100; proteinuria; normal renal ultrasound in 2010; recent creatinines of 1.7-2.; Bilateral cystic renal masses by CT in 2011  . Nephrolithiasis 2012    ARF in 01/2011 due to obstructing nephrolithiasis  . Arm fracture     right arm  . Essential and other specified forms of tremor 02/23/2013  . Diplopia 02/23/2013  . Polyneuropathy in diabetes(357.2) 02/23/2013  . Abnormality of gait 02/23/2013  . Hx of Clostridium difficile infection   . Adenocarcinoma of right lung ([place]) 02/24/2011    Ct A/P 2012:  2cm lung mass RLL PET 8242:  Hypermetabolic RLL mass, no other hypermetabolic areas. TTNA 02/2011:  Adenocarcinoma, markers c/w lung origin Right lower lobe superior segmentectomy. 04/01/2011 Dr. Arlyce Dice   . Coronary artery disease   . Myocardial infarction ([place])   . Cough     thick phlegm    Past Surgical History  Procedure Laterality Date  . Transurethral resection of prostate    . Cystectomy    . Tonsillectomy    . Colonoscopy  03/19/2010    Dr. Gala Romney -(poor prep) Anal papilla, rectal hyperplastic polyp, tubular adenoma removed splenic flexure, left-sided diverticula  . Wedge resection  04/2011    carcinoma of lung  . Colonoscopy  11/28/2004    RMR:   Diminutive rectal and left colon polyps as described above, cold  biopsied/removed/  Left sided diverticula. The remainder of the colonic mucosa appeared normal.  . Colonoscopy   09/15/01    RMR: Multiple diminutive polyps destroyed with dermolysis as described above/ Multiple small polyps on stalks in the colon resected with snare cautery/ Scattered pan colonic diverticulum/ The remainder of the colonic mucosa appeared normal  . Abdominal aortic aneurysm repair  2004  . Cardiac catheterization    . Lung lobectomy    . Video bronchoscopy with endobronchial navigation N/A 10/10/2013    Procedure: VIDEO BRONCHOSCOPY WITH ENDOBRONCHIAL NAVIGATION;  Surgeon: Melrose Nakayama, MD;  Location: [facility];  Service: Thoracic;  Laterality: N/A;  NO BLOOD THINNERS BUT PATIENT HAS ITP  . Cystoscopy  04/2014  . Flexible sigmoidoscopy  2014    Dr. Olevia Perches: tubular adenoma, negative stool studies   . Colonoscopy N/A 05/01/2015    Procedure: COLONOSCOPY;  Surgeon: Daneil Dolin, MD;  [facility];  Service: Endoscopy;  Laterality: N/A;  1115  . Cystostomy w/ bladder biopsy      Social History   Social History  . Marital Status: Married    Spouse Name: [name]   . Number of Children: 2  . Years of Education: 12+   Occupational History  . Retired  [employer]   Social History Main Topics  . Smoking status: Former Smoker -- 1.00 packs/day for 50 years    Types: Cigarettes    Start date: 07/22/1955    Quit date: 12/19/2012  . Smokeless tobacco: Never Used  . Alcohol Use: No  . Drug Use: No  . Sexual Activity: No   Other Topics Concern  . Not on file   Social History Narrative   Patient lives at home with his wife [name].    Patient has 2 children.    Patient is retired.    Patient is right handed.    Patient has 2 years of college.    3-4 cups of caffeine daily.        Filed Vitals:   09/26/15 0854  BP: 132/70  Pulse: 63  Height: 6' (1.829 m)  Weight: 270 lb (122.471 kg)    SpO2: 97%    PHYSICAL EXAM General: NAD HEENT: Normal. Neck: No JVD, no thyromegaly. Lungs: Diminished but clear, no wheezes or rales. CV: Nondisplaced PMI. Distant heart tones. Regular rate and rhythm, normal S1/S2, no S3/S4, no murmur. No pretibial or periankle edema. No carotid bruit.  Abdomen: Obese, +ventral hernia.  Neurologic: Alert and oriented x 3.  Psych: Normal affect. Skin: Normal. Musculoskeletal: No gross deformities. Extremities: No clubbing or cyanosis.   ECG: Most recent ECG reviewed.      ASSESSMENT AND PLAN: 1. CAD: Stable ischemic heart disease. Continue ASA, metoprolol, and pravastatin.  2. Essential hypertension: Well controlled on current therapy which includes lisinopril 5 mg daily. No changes.  3. Hyperlipidemia: 08/27/15 lipid panel showed TC 144, TG 175, HDL 29, LDL 80. Continue present dose of statin therapy.  4. AAA s/p repair: Stable.  Dispo: f/u 1 year.   Kate Sable, M.D., F.A.C.C.

## 2015-10-01 DIAGNOSIS — D090 Carcinoma in situ of bladder: Secondary | ICD-10-CM | POA: Diagnosis not present

## 2015-10-01 DIAGNOSIS — C675 Malignant neoplasm of bladder neck: Secondary | ICD-10-CM | POA: Diagnosis not present

## 2015-10-02 DIAGNOSIS — C675 Malignant neoplasm of bladder neck: Secondary | ICD-10-CM | POA: Diagnosis not present

## 2015-10-03 ENCOUNTER — Ambulatory Visit (INDEPENDENT_AMBULATORY_CARE_PROVIDER_SITE_OTHER): Payer: Medicare Other | Admitting: Pulmonary Disease

## 2015-10-03 ENCOUNTER — Ambulatory Visit (INDEPENDENT_AMBULATORY_CARE_PROVIDER_SITE_OTHER)
Admission: RE | Admit: 2015-10-03 | Discharge: 2015-10-03 | Disposition: A | Discharge status: Home / Self Care | Payer: Medicare Other | Source: Ambulatory Visit | Attending: Pulmonary Disease | Admitting: Pulmonary Disease

## 2015-10-03 ENCOUNTER — Encounter: Payer: Self-pay | Admitting: Pulmonary Disease

## 2015-10-03 VITALS — BP 136/82 | HR 63 | Ht 72.0 in | Wt 270.8 lb

## 2015-10-03 DIAGNOSIS — J3089 Other allergic rhinitis: Secondary | ICD-10-CM | POA: Diagnosis not present

## 2015-10-03 DIAGNOSIS — C679 Malignant neoplasm of bladder, unspecified: Secondary | ICD-10-CM

## 2015-10-03 DIAGNOSIS — R059 Cough, unspecified: Secondary | ICD-10-CM

## 2015-10-03 DIAGNOSIS — J438 Other emphysema: Secondary | ICD-10-CM | POA: Diagnosis not present

## 2015-10-03 DIAGNOSIS — R05 Cough: Secondary | ICD-10-CM

## 2015-10-03 DIAGNOSIS — J309 Allergic rhinitis, unspecified: Secondary | ICD-10-CM | POA: Insufficient documentation

## 2015-10-03 DIAGNOSIS — C3491 Malignant neoplasm of unspecified part of right bronchus or lung: Secondary | ICD-10-CM | POA: Diagnosis not present

## 2015-10-03 MED ORDER — PREDNISONE 20 MG PO TABS: 20.0000 mg | ORAL_TABLET | Freq: Every day | ORAL | Status: AC

## 2015-10-03 NOTE — Progress Notes (Signed)
Subjective:    Patient ID: Kyle [name], male    DOB: April 05, 1934, 82 y.o.   MRN: [id number]  Synopsis:  Former patient of Dr. Gwenette Greet with COPD PFT's 02/2011:  FEV1 2.40 (83%), ratio 60, nl TLC, DLCO 59%; treated with nocturnal oxygen. +response to spiriva alone >> not frequent exacerbator Trial of stiolto 2015 >> no change in breathing.  Participated in pulmonary rehab, then quit going.   Kyle [name] also had Lung cancer (Stage IA adeno, resected 2012) and urethral cancer AND Bladder cancer.    HPI Chief Complaint  Patient presents with  . Follow-up    pt doing well, states it is harder to breathe during cold weather.  pt about to start radiation tx for urethral ca.     [name] was diagnosed with urethral cancer recently and will be treated with radiation treatment and chemotherapy.   He starts treatment in the next few weeks. Hi breathing has been OK since the last visit. His wife says that he has been coughing a lot more which is better with mucinex and a lot of mucus production.  He doesn't recall having cold symptoms at the start of this.  He has had a lot of sick contacts but none recently.  He has a lot of phlegm in his mouth, he says.  The mucinex is helping.  He doesn't really feel more short of breath, no chest pain, no hemoptysis.   He has had a lot of sinus congestion lately, more than normal.   No acid reflux or heartburn.  Past Medical History  Diagnosis Date  . Arteriosclerotic cardiovascular disease (ASCVD) 1973, 12/2010    S/P NSTEMI secondary to distal RCA/PL lesion, tx medically.  EF of  55%-60% per  echo.  . Diabetes mellitus     Type II  . Thrombocytopenia (Kyle [name])   . COPD (chronic obstructive pulmonary disease) ([place])   . AAA (abdominal aortic aneurysm) ([place]) 2004    s/p repair 2004; 4.3 cm infrarenal in 05/2011  . Tobacco abuse     50-pack-year consumption; quit in 12/2010  . Hyperlipidemia   . OSA (obstructive sleep apnea)   . Hypertension   . Benign  prostatic hypertrophy     s/p transurethral resection of the prostate  . Obesity   . Tubular adenoma of colon   . Cataract   . Ventral hernia   . Diverticulosis   . Bilateral renal masses     Cystic, more prominent on CT in 12/2010 than 2007; followed by Dr. Rosana Hoes  . ITP (idiopathic thrombocytopenic purpura) 09/07/2012    Chronic ITP of adults versus medication-induced ITP.  Stable  . Insomnia   . Bladder cancer [facility]) 1996    Transurethral resection of the bladder + chemotherapy/BCG as premed  . Adenocarcinoma, lung ([place]) 01/2011    transthoracic FNA; resection of the superior segment of the RLL in 03/2011; negative nodes; no chemotherapy nor radiation planned  . Chronic kidney disease     Creatinine 1.4 on discharge 12/20/2100; proteinuria; normal renal ultrasound in 2010; recent creatinines of 1.7-2.; Bilateral cystic renal masses by CT in 2011  . Nephrolithiasis 2012    ARF in 01/2011 due to obstructing nephrolithiasis  . Arm fracture     right arm  . Essential and other specified forms of tremor 02/23/2013  . Diplopia 02/23/2013  . Polyneuropathy in diabetes(357.2) 02/23/2013  . Abnormality of gait 02/23/2013  . Hx of Clostridium difficile infection   . Adenocarcinoma of right lung (  [place]) 02/24/2011    Ct A/P 2012:  2cm lung mass RLL PET 4696:  Hypermetabolic RLL mass, no other hypermetabolic areas. TTNA 02/2011:  Adenocarcinoma, markers c/w lung origin Right lower lobe superior segmentectomy. 04/01/2011 Dr. Arlyce Dice   . Coronary artery disease   . Myocardial infarction ([place])   . Cough     thick phlegm      Review of Systems  Constitutional: Negative for fever, chills and fatigue.  HENT: Positive for postnasal drip, rhinorrhea and sinus pressure.   Respiratory: Positive for cough. Negative for shortness of breath and wheezing.   Cardiovascular: Negative for chest pain and leg swelling.       Objective:   Physical Exam Filed Vitals:   10/03/15 0857  BP: 136/82  Pulse: 63  Height: 6'  (1.829 m)  Weight: 270 lb 12.8 oz (122.834 kg)  SpO2: 94%   Gen: chronically ill appearing HENT: OP clear,  neck supple PULM: wheezing bilaterally, normal air movement CV: RRR, no mgr, trace edema GI: BS+, soft, nontender Derm: no cyanosis or rash Psyche: normal mood and affect   Records from Oncology reviewed recently, continuing surveillance  CBC    Component Value Date/Time   WBC 7.5 12/25/2014 0858   RBC 4.87 12/25/2014 0858   HGB 15.4 12/25/2014 0858   HCT 46.9 12/25/2014 0858   PLT 118* 12/25/2014 0858   MCV 96.3 12/25/2014 0858   MCH 31.6 12/25/2014 0858   MCHC 32.8 12/25/2014 0858   RDW 15.5 12/25/2014 0858   LYMPHSABS 2.2 12/25/2014 0858   MONOABS 0.5 12/25/2014 0858   EOSABS 0.2 12/25/2014 0858   BASOSABS 0.0 12/25/2014 0858         Assessment & Plan:  Cough This is due to a mild COPD exacerbation and poorly controlled allergic rhinitis, but given his history of lung cancer we need to be cautious and get a chest x-ray to ensure there is nothing else going on.  Plan: Chest x-ray today See allergic rhinitis Treat COPD exacerbation with prednisone  Allergic rhinitis This is poorly controlled.  He was given the following instructions: Use Neil Med rinses with distilled water at least twice per day using the instructions on the package. 1/2 hour after using the [facility] Med rinse, use Nasacort two puffs in each nostril once per day.  Remember that the Nasacort can take 1-2 weeks to work after regular use. Use generic zyrtec (cetirizine) every day.  If this doesn't help, then stop taking it and use chlorpheniramine-phenylephrine combination tablets.    Adenocarcinoma of right lung Given his history of lung cancer, we will get a chest x-ray with his recent cough. Otherwise, I recommend that he continue his follow-up with oncology in [place].  As he was recently diagnosed with bladder/urethral cancer (recurrent) I will forward this note to his pulmonary  oncologist to ensure they are aware of this diagnosis.  COPD (chronic obstructive pulmonary disease) He is experiencing a mild exacerbation of COPD as he is wheezing a bit more on exam today. Otherwise, this has been a stable interval for him recently.  Plan: Prednisone for 5 days Continue Spiriva Albuterol when necessary Follow-up 4-6 months  Bladder carcinoma Now with urethral cancer, about to undergo chemo-radiation treatment.     Current outpatient prescriptions:  .  acetaminophen (TYLENOL) 500 MG tablet, Take 500 mg by mouth every 6 (six) hours as needed for mild pain. , Disp: , Rfl:  .  albuterol (PROAIR HFA) 108 (90 BASE) MCG/ACT inhaler, Inhale 2  puffs into the lungs every 6 (six) hours as needed for wheezing., Disp: 1 Inhaler, Rfl: 5 .  ALPRAZolam (XANAX) 0.5 MG tablet, TAKE ONE TABLET TWICE DAILY AS NEEDED FOR ANXIETY, Disp: 60 tablet, Rfl: 5 .  aspirin EC 81 MG tablet, Take 81 mg by mouth daily. , Disp: , Rfl:  .  calcitRIOL (ROCALTROL) 0.25 MCG capsule, , Disp: , Rfl:  .  cetirizine (ZYRTEC) 10 MG tablet, Take 1 tablet (10 mg total) by mouth daily., Disp: 30 tablet, Rfl: 5 .  citalopram (CELEXA) 20 MG tablet, Take 1 tablet (20 mg total) by mouth daily., Disp: 90 tablet, Rfl: 1 .  fluticasone (FLONASE) 50 MCG/ACT nasal spray, Place 2 sprays into both nostrils daily as needed for allergies., Disp: 16 g, Rfl: 5 .  ipratropium (ATROVENT) 0.06 % nasal spray, Place 2 sprays into both nostrils 4 (four) times daily. (Patient taking differently: Place 2 sprays into both nostrils as needed. ), Disp: 15 mL, Rfl: 5 .  lisinopril (PRINIVIL,ZESTRIL) 5 MG tablet, TAKE ONE (1) TABLET BY MOUTH EVERY DAY, Disp: 90 tablet, Rfl: 1 .  metoprolol (LOPRESSOR) 50 MG tablet, TAKE ONE TABLET TWICE DAILY, Disp: 180 tablet, Rfl: 1 .  niacin (NIASPAN) 500 MG CR tablet, Take 2 tablets (1,000 mg total) by mouth at bedtime., Disp: 60 tablet, Rfl: 5 .  nitroGLYCERIN (NITROSTAT) 0.4 MG SL tablet, Place 1  tablet (0.4 mg total) under the tongue as needed. Place 0.4 mg under the tongue every 5 (five) minutes as needed. Call MD if need more than 2, Disp: 3 tablet, Rfl: 5 .  pravastatin (PRAVACHOL) 80 MG tablet, TAKE ONE CAPSULE BY MOUTH DAILY, Disp: 90 tablet, Rfl: 1 .  tiotropium (SPIRIVA HANDIHALER) 18 MCG inhalation capsule, INHALE ONE DOSE BY MOUTH ONCE DAILY, Disp: 90 capsule, Rfl: 5 .  Vitamin D, Ergocalciferol, (DRISDOL) 50000 units CAPS capsule, Take by mouth., Disp: , Rfl:  .  predniSONE (DELTASONE) 20 MG tablet, Take 1 tablet (20 mg total) by mouth daily with breakfast., Disp: 5 tablet, Rfl: 0 .  [DISCONTINUED] budesonide-formoterol (SYMBICORT) 160-4.5 MCG/ACT inhaler, Inhale 2 puffs into the lungs 2 (two) times daily.  , Disp: , Rfl:

## 2015-10-03 NOTE — Assessment & Plan Note (Signed)
This is due to a mild COPD exacerbation and poorly controlled allergic rhinitis, but given his history of lung cancer we need to be cautious and get a chest x-ray to ensure there is nothing else going on.  Plan: Chest x-ray today See allergic rhinitis Treat COPD exacerbation with prednisone

## 2015-10-03 NOTE — Assessment & Plan Note (Signed)
This is poorly controlled.  He was given the following instructions: Use Neil Med rinses with distilled water at least twice per day using the instructions on the package. 1/2 hour after using the [facility] Med rinse, use Nasacort two puffs in each nostril once per day.  Remember that the Nasacort can take 1-2 weeks to work after regular use. Use generic zyrtec (cetirizine) every day.  If this doesn't help, then stop taking it and use chlorpheniramine-phenylephrine combination tablets.

## 2015-10-03 NOTE — Assessment & Plan Note (Signed)
Now with urethral cancer, about to undergo chemo-radiation treatment.

## 2015-10-03 NOTE — Patient Instructions (Signed)
For the sinus problem: Use Neil Med rinses with distilled water at least twice per day using the instructions on the package. 1/2 hour after using the [facility] Med rinse, use Nasacort (or Flonase) two puffs in each nostril once per day.  Remember that the nasal steroids can take 1-2 weeks to work after regular use. Use generic zyrtec (cetirizine) every day.  If this doesn't help, then stop taking it and use chlorpheniramine-phenylephrine combination tablets.  If this doesn't help with your cough in about 3-4 weeks let me know and I can change your blood pressure pill  Take the prednisone for 5 days  I will call you with the results of the chest x-ray  I will see you back in 4-6 months or sooner if needed

## 2015-10-03 NOTE — Assessment & Plan Note (Signed)
He is experiencing a mild exacerbation of COPD as he is wheezing a bit more on exam today. Otherwise, this has been a stable interval for him recently.  Plan: Prednisone for 5 days Continue Spiriva Albuterol when necessary Follow-up 4-6 months

## 2015-10-03 NOTE — Assessment & Plan Note (Signed)
Given his history of lung cancer, we will get a chest x-ray with his recent cough. Otherwise, I recommend that he continue his follow-up with oncology in [place].  As he was recently diagnosed with bladder/urethral cancer (recurrent) I will forward this note to his pulmonary oncologist to ensure they are aware of this diagnosis.

## 2015-10-05 ENCOUNTER — Other Ambulatory Visit: Payer: Self-pay | Admitting: *Deleted

## 2015-10-05 ENCOUNTER — Ambulatory Visit: Payer: Medicare Other | Admitting: Pulmonary Disease

## 2015-10-05 MED ORDER — NITROGLYCERIN 0.4 MG SL SUBL: 0.4000 mg | SUBLINGUAL_TABLET | SUBLINGUAL | Status: DC | PRN

## 2015-10-15 DIAGNOSIS — Z85118 Personal history of other malignant neoplasm of bronchus and lung: Secondary | ICD-10-CM | POA: Diagnosis not present

## 2015-10-15 DIAGNOSIS — C680 Malignant neoplasm of urethra: Secondary | ICD-10-CM | POA: Diagnosis not present

## 2015-10-15 DIAGNOSIS — N183 Chronic kidney disease, stage 3 (moderate): Secondary | ICD-10-CM | POA: Diagnosis not present

## 2015-10-16 ENCOUNTER — Other Ambulatory Visit: Payer: Self-pay | Admitting: *Deleted

## 2015-10-16 DIAGNOSIS — C680 Malignant neoplasm of urethra: Secondary | ICD-10-CM

## 2015-10-17 DIAGNOSIS — Z85118 Personal history of other malignant neoplasm of bronchus and lung: Secondary | ICD-10-CM | POA: Diagnosis not present

## 2015-10-17 DIAGNOSIS — N183 Chronic kidney disease, stage 3 (moderate): Secondary | ICD-10-CM | POA: Diagnosis not present

## 2015-10-17 DIAGNOSIS — C680 Malignant neoplasm of urethra: Secondary | ICD-10-CM | POA: Diagnosis not present

## 2015-10-18 DIAGNOSIS — J449 Chronic obstructive pulmonary disease, unspecified: Secondary | ICD-10-CM | POA: Diagnosis not present

## 2015-10-19 DIAGNOSIS — Z85118 Personal history of other malignant neoplasm of bronchus and lung: Secondary | ICD-10-CM | POA: Diagnosis not present

## 2015-10-19 DIAGNOSIS — I10 Essential (primary) hypertension: Secondary | ICD-10-CM | POA: Diagnosis not present

## 2015-10-19 DIAGNOSIS — Z87891 Personal history of nicotine dependence: Secondary | ICD-10-CM | POA: Diagnosis not present

## 2015-10-19 DIAGNOSIS — J449 Chronic obstructive pulmonary disease, unspecified: Secondary | ICD-10-CM | POA: Diagnosis not present

## 2015-10-19 DIAGNOSIS — C680 Malignant neoplasm of urethra: Secondary | ICD-10-CM | POA: Diagnosis not present

## 2015-10-19 DIAGNOSIS — Z8052 Family history of malignant neoplasm of bladder: Secondary | ICD-10-CM | POA: Diagnosis not present

## 2015-10-19 DIAGNOSIS — E785 Hyperlipidemia, unspecified: Secondary | ICD-10-CM | POA: Diagnosis not present

## 2015-10-22 ENCOUNTER — Other Ambulatory Visit: Payer: Self-pay | Admitting: *Deleted

## 2015-10-22 ENCOUNTER — Inpatient Hospital Stay (HOSPITAL_COMMUNITY)
Admission: RE | Admit: 2015-10-22 | Discharge: 2015-10-22 | Disposition: A | Discharge status: Home / Self Care | Payer: Medicare Other | Source: Ambulatory Visit

## 2015-10-24 ENCOUNTER — Ambulatory Visit (HOSPITAL_COMMUNITY)
Admission: RE | Admit: 2015-10-24 | Payer: Medicare Other | Source: Ambulatory Visit | Admitting: Thoracic Surgery (Cardiothoracic Vascular Surgery)

## 2015-10-24 ENCOUNTER — Ambulatory Visit (HOSPITAL_COMMUNITY): Admission: RE | Payer: Medicare Other | Source: Ambulatory Visit

## 2015-10-24 SURGERY — INSERTION, TUNNELED CENTRAL VENOUS DEVICE, WITH PORT
Anesthesia: Monitor Anesthesia Care | Laterality: Bilateral

## 2015-10-25 ENCOUNTER — Ambulatory Visit: Payer: Self-pay

## 2015-10-25 ENCOUNTER — Other Ambulatory Visit: Payer: Self-pay

## 2015-10-25 NOTE — Patient Outreach (Signed)
[place] [facility]) Care Management  10/25/2015  Kyle [name] September 16, 1933 [id number]   Telephone call to patient for monthly outreach.  Wife states that patient is not in right now.  Health coach contact information given.    Plan: RN Health Coach will contact patient within 1-2 weeks.    [name], RN, MSN [place] [phone]

## 2015-10-26 ENCOUNTER — Other Ambulatory Visit: Payer: Self-pay

## 2015-10-26 DIAGNOSIS — Z510 Encounter for antineoplastic radiation therapy: Secondary | ICD-10-CM | POA: Diagnosis not present

## 2015-10-26 DIAGNOSIS — C680 Malignant neoplasm of urethra: Secondary | ICD-10-CM | POA: Diagnosis not present

## 2015-10-26 NOTE — Patient Outreach (Signed)
[facility]) Care Management  [place]  10/26/2015   STILLMAN BUENGER 08/05/1933 [id number]  Subjective: Telephone call to patient for monthly call. 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  Patient reports that he is ok with things right now and that he trusts the [place].  He states that his sugars have been pretty good and that he is trying to keep his appetite up.  Discussed with patient sugars and appetite and trying to remain healthy for upcoming treatments.  He verbalized understanding.    Objective:   Encounter Medications:  Outpatient Encounter Prescriptions as of 10/26/2015  Medication Sig  . acetaminophen (TYLENOL) 500 MG tablet Take 500 mg by mouth every 6 (six) hours as needed for mild pain.   Marland Kitchen albuterol (PROAIR HFA) 108 (90 BASE) MCG/ACT inhaler Inhale 2 puffs into the lungs every 6 (six) hours as needed for wheezing.  Marland Kitchen ALPRAZolam (XANAX) 0.5 MG tablet TAKE ONE TABLET TWICE DAILY AS NEEDED FOR ANXIETY  . aspirin EC 81 MG tablet Take 81 mg by mouth daily.   . calcitRIOL (ROCALTROL) 0.25 MCG capsule   . cetirizine (ZYRTEC) 10 MG tablet Take 1 tablet (10 mg total) by mouth daily.  . citalopram (CELEXA) 20 MG tablet Take 1 tablet (20 mg total) by mouth daily.  . ferrous sulfate 325 (65 FE) MG tablet Take 325 mg by mouth daily with breakfast.  . fluticasone (FLONASE) 50 MCG/ACT nasal spray Place 2 sprays into both nostrils daily as needed for allergies.  Marland Kitchen ipratropium (ATROVENT) 0.06 % nasal spray Place 2 sprays into both nostrils 4 (four) times daily. (Patient taking differently: Place 2 sprays into both nostrils as needed. )  . lisinopril (PRINIVIL,ZESTRIL) 5 MG tablet TAKE ONE (1) TABLET BY MOUTH EVERY DAY  . metoprolol (LOPRESSOR) 50 MG tablet TAKE ONE TABLET TWICE DAILY  . niacin (NIASPAN) 500 MG CR tablet Take 2 tablets (1,000 mg total) by mouth  at bedtime.  . nitroGLYCERIN (NITROSTAT) 0.4 MG SL tablet Place 1 tablet (0.4 mg total) under the tongue every 5 (five) minutes as needed. Call MD if need more than 2  . pravastatin (PRAVACHOL) 80 MG tablet TAKE ONE CAPSULE BY MOUTH DAILY  . tiotropium (SPIRIVA HANDIHALER) 18 MCG inhalation capsule INHALE ONE DOSE BY MOUTH ONCE DAILY   No facility-administered encounter medications on file as of 10/26/2015.    Functional Status:  In your present state of health, do you have any difficulty performing the following activities: 10/26/2015 03/05/2015  Hearing? [name]  Vision? Y Y  Difficulty concentrating or making decisions? [name]  Walking or climbing stairs? Y Y  Dressing or bathing? N N  Doing errands, shopping? N N  Preparing Food and eating ? N N  Using the Toilet? N N  In the past six months, have you accidently leaked urine? N N  Do you have problems with loss of bowel control? N N  Managing your Medications? N N  Managing your Finances? N N  Housekeeping or managing your Housekeeping? N N    Fall/Depression Screening: PHQ 2/9 Scores 10/26/2015 08/30/2015 04/30/2015 03/05/2015 01/05/2015 12/08/2014 11/13/2014  PHQ - 2 Score 0 0 0 0 0 0 0    Assessment: Patient continues to benefit from health coach outreach for disease management and support.    Plan:  [facility] CM Care Plan Problem One  Most Recent Value   Care Plan Problem One  Diabetes Disease Management needs   Role Documenting the Problem One  Health Coach   THN CM Short Term Goal #1 (0-30 days)  patient will resume checking cbg's at least once weekly over the next 30 days as evidenced by patient documentation and monitor readings   THN CM Short Term Goal #1 Start Date  10/26/15 Barrie Folk continued]   Interventions for Short Term Goal #1  [place] reviewed with patient the importance fo checking blood sugars regularly.     THN CM Short Term Goal #3 (0-30 days)  patient will continue to work to improve dietary balance by watching  carbohydrates and sugars within the next 30 days.   THN CM Short Term Goal #3 Start Date  10/26/15 Barrie Folk continued]   Interventions for Short Tern Goal #3  [place] reviewed with patient the importance of watching carbohydrates and limiting sweets.       RN Health Coach will contact patient within one month and patient agrees to next outreach.    [name], RN, MSN [place] [phone]

## 2015-10-30 DIAGNOSIS — C680 Malignant neoplasm of urethra: Secondary | ICD-10-CM | POA: Diagnosis not present

## 2015-10-30 DIAGNOSIS — N183 Chronic kidney disease, stage 3 (moderate): Secondary | ICD-10-CM | POA: Diagnosis not present

## 2015-10-30 DIAGNOSIS — E559 Vitamin D deficiency, unspecified: Secondary | ICD-10-CM | POA: Diagnosis not present

## 2015-10-30 DIAGNOSIS — Z510 Encounter for antineoplastic radiation therapy: Secondary | ICD-10-CM | POA: Diagnosis not present

## 2015-10-31 DIAGNOSIS — C680 Malignant neoplasm of urethra: Secondary | ICD-10-CM | POA: Diagnosis not present

## 2015-10-31 DIAGNOSIS — Z510 Encounter for antineoplastic radiation therapy: Secondary | ICD-10-CM | POA: Diagnosis not present

## 2015-11-01 DIAGNOSIS — Z510 Encounter for antineoplastic radiation therapy: Secondary | ICD-10-CM | POA: Diagnosis not present

## 2015-11-01 DIAGNOSIS — C680 Malignant neoplasm of urethra: Secondary | ICD-10-CM | POA: Diagnosis not present

## 2015-11-04 DIAGNOSIS — Z510 Encounter for antineoplastic radiation therapy: Secondary | ICD-10-CM | POA: Diagnosis not present

## 2015-11-04 DIAGNOSIS — C680 Malignant neoplasm of urethra: Secondary | ICD-10-CM | POA: Diagnosis not present

## 2015-11-05 DIAGNOSIS — N183 Chronic kidney disease, stage 3 (moderate): Secondary | ICD-10-CM | POA: Diagnosis not present

## 2015-11-05 DIAGNOSIS — C680 Malignant neoplasm of urethra: Secondary | ICD-10-CM | POA: Diagnosis not present

## 2015-11-05 DIAGNOSIS — Z510 Encounter for antineoplastic radiation therapy: Secondary | ICD-10-CM | POA: Diagnosis not present

## 2015-11-06 DIAGNOSIS — C680 Malignant neoplasm of urethra: Secondary | ICD-10-CM | POA: Diagnosis not present

## 2015-11-06 DIAGNOSIS — Z510 Encounter for antineoplastic radiation therapy: Secondary | ICD-10-CM | POA: Diagnosis not present

## 2015-11-07 DIAGNOSIS — C680 Malignant neoplasm of urethra: Secondary | ICD-10-CM | POA: Diagnosis not present

## 2015-11-07 DIAGNOSIS — Z510 Encounter for antineoplastic radiation therapy: Secondary | ICD-10-CM | POA: Diagnosis not present

## 2015-11-08 DIAGNOSIS — Z510 Encounter for antineoplastic radiation therapy: Secondary | ICD-10-CM | POA: Diagnosis not present

## 2015-11-08 DIAGNOSIS — C680 Malignant neoplasm of urethra: Secondary | ICD-10-CM | POA: Diagnosis not present

## 2015-11-09 DIAGNOSIS — Z510 Encounter for antineoplastic radiation therapy: Secondary | ICD-10-CM | POA: Diagnosis not present

## 2015-11-09 DIAGNOSIS — C680 Malignant neoplasm of urethra: Secondary | ICD-10-CM | POA: Diagnosis not present

## 2015-11-12 ENCOUNTER — Other Ambulatory Visit: Payer: Self-pay

## 2015-11-12 DIAGNOSIS — C680 Malignant neoplasm of urethra: Secondary | ICD-10-CM | POA: Diagnosis not present

## 2015-11-12 DIAGNOSIS — Z510 Encounter for antineoplastic radiation therapy: Secondary | ICD-10-CM | POA: Diagnosis not present

## 2015-11-12 DIAGNOSIS — N182 Chronic kidney disease, stage 2 (mild): Secondary | ICD-10-CM | POA: Diagnosis not present

## 2015-11-12 NOTE — Patient Outreach (Signed)
[facility]) Care Management  11/12/2015  Kyle [name] 09/28/1933 [id number]   Telephone call to patient to follow up after the start of radiation and chemo. Patient reports he is doing ok except for being tired.  Patient reports that things have actually gone better than he expected.  Patient reports his appetite remains good and he is able to get to his treatments.  Patient denies pain.  No concerns.    Plan: RN Health Coach will begin monthly contact with patient as change in health status and patient agrees.   RN Health Coach will contact patient within one month and patient agrees to next outreach.    [name], RN, MSN [place] [phone]

## 2015-11-13 DIAGNOSIS — D696 Thrombocytopenia, unspecified: Secondary | ICD-10-CM | POA: Diagnosis not present

## 2015-11-13 DIAGNOSIS — C680 Malignant neoplasm of urethra: Secondary | ICD-10-CM | POA: Diagnosis not present

## 2015-11-13 DIAGNOSIS — Z510 Encounter for antineoplastic radiation therapy: Secondary | ICD-10-CM | POA: Diagnosis not present

## 2015-11-14 DIAGNOSIS — Z510 Encounter for antineoplastic radiation therapy: Secondary | ICD-10-CM | POA: Diagnosis not present

## 2015-11-14 DIAGNOSIS — C680 Malignant neoplasm of urethra: Secondary | ICD-10-CM | POA: Diagnosis not present

## 2015-11-14 DIAGNOSIS — M6281 Muscle weakness (generalized): Secondary | ICD-10-CM | POA: Diagnosis not present

## 2015-11-15 DIAGNOSIS — Z510 Encounter for antineoplastic radiation therapy: Secondary | ICD-10-CM | POA: Diagnosis not present

## 2015-11-15 DIAGNOSIS — C680 Malignant neoplasm of urethra: Secondary | ICD-10-CM | POA: Diagnosis not present

## 2015-11-16 DIAGNOSIS — C680 Malignant neoplasm of urethra: Secondary | ICD-10-CM | POA: Diagnosis not present

## 2015-11-16 DIAGNOSIS — Z510 Encounter for antineoplastic radiation therapy: Secondary | ICD-10-CM | POA: Diagnosis not present

## 2015-11-18 DIAGNOSIS — J449 Chronic obstructive pulmonary disease, unspecified: Secondary | ICD-10-CM | POA: Diagnosis not present

## 2015-11-19 DIAGNOSIS — C680 Malignant neoplasm of urethra: Secondary | ICD-10-CM | POA: Diagnosis not present

## 2015-11-19 DIAGNOSIS — Z510 Encounter for antineoplastic radiation therapy: Secondary | ICD-10-CM | POA: Diagnosis not present

## 2015-11-20 DIAGNOSIS — C680 Malignant neoplasm of urethra: Secondary | ICD-10-CM | POA: Diagnosis not present

## 2015-11-20 DIAGNOSIS — N183 Chronic kidney disease, stage 3 (moderate): Secondary | ICD-10-CM | POA: Diagnosis not present

## 2015-11-20 DIAGNOSIS — Z510 Encounter for antineoplastic radiation therapy: Secondary | ICD-10-CM | POA: Diagnosis not present

## 2015-11-21 ENCOUNTER — Ambulatory Visit: Payer: Medicare Other | Admitting: Family Medicine

## 2015-11-21 DIAGNOSIS — C680 Malignant neoplasm of urethra: Secondary | ICD-10-CM | POA: Diagnosis not present

## 2015-11-21 DIAGNOSIS — Z510 Encounter for antineoplastic radiation therapy: Secondary | ICD-10-CM | POA: Diagnosis not present

## 2015-11-22 ENCOUNTER — Ambulatory Visit (INDEPENDENT_AMBULATORY_CARE_PROVIDER_SITE_OTHER): Payer: Medicare Other | Admitting: Family Medicine

## 2015-11-22 ENCOUNTER — Encounter: Payer: Self-pay | Admitting: Family Medicine

## 2015-11-22 VITALS — BP 118/72 | Ht 72.0 in | Wt 270.0 lb

## 2015-11-22 DIAGNOSIS — Z510 Encounter for antineoplastic radiation therapy: Secondary | ICD-10-CM | POA: Diagnosis not present

## 2015-11-22 DIAGNOSIS — J449 Chronic obstructive pulmonary disease, unspecified: Secondary | ICD-10-CM

## 2015-11-22 DIAGNOSIS — I2510 Atherosclerotic heart disease of native coronary artery without angina pectoris: Secondary | ICD-10-CM

## 2015-11-22 DIAGNOSIS — N183 Chronic kidney disease, stage 3 unspecified: Secondary | ICD-10-CM

## 2015-11-22 DIAGNOSIS — E119 Type 2 diabetes mellitus without complications: Secondary | ICD-10-CM

## 2015-11-22 DIAGNOSIS — C680 Malignant neoplasm of urethra: Secondary | ICD-10-CM | POA: Diagnosis not present

## 2015-11-22 DIAGNOSIS — C3491 Malignant neoplasm of unspecified part of right bronchus or lung: Secondary | ICD-10-CM | POA: Diagnosis not present

## 2015-11-22 LAB — POCT GLYCOSYLATED HEMOGLOBIN (HGB A1C): Hemoglobin A1C: 5.9

## 2015-11-22 NOTE — Progress Notes (Signed)
   Subjective:    Patient ID: Kyle [name], male    DOB: 1934/02/23, 82 y.o.   MRN: [id number]  Diabetes He presents for his follow-up diabetic visit. He has type 2 diabetes mellitus. Risk factors for coronary artery disease include diabetes mellitus, dyslipidemia and hypertension. Current diabetic treatment includes diet. He is compliant with treatment all of the time. His weight is stable. He is following a diabetic diet. He has not had a previous visit with a dietitian. He does not see a podiatrist.Eye exam is not current.   Results for orders placed or performed in visit on 11/22/15  POCT glycosylated hemoglobin (Hb A1C)  Result Value Ref Range   Hemoglobin A1C 5.9    Numbers overall low  Uses spiriva faithfully, definitely seems of helped his breathing Sticking with flonase, nd it seems to help for the allergies  Compliant blood pressure medication. No obvious side effect. Watching salt intake. Does not miss a dose  Patient would like handicap card for vehicle . Currently being treated for cancer of urethra . Seen Dr. Abran Duke in the urologist for this  Review of Systems No headache, no major weight loss or weight gain, no chest pain no back pain abdominal pain no change in bowel habits complete ROS otherwise negative     Objective:   Physical Exam Alert vitals stable. Blood pressure good on repeat chronically sick appearing but no acute distress obesity present lungs clear currently heart regular in rhythm ankles without edema feet pulses good sensation intact       Assessment & Plan:  Impression 1 type 2 diabetes good tight control maintain same approach #2 hypertension good control maintain same #3 COPD with reactive airway element clinically stable meds to maintain same #4 urethral cancer discussed

## 2015-11-23 DIAGNOSIS — Z510 Encounter for antineoplastic radiation therapy: Secondary | ICD-10-CM | POA: Diagnosis not present

## 2015-11-23 DIAGNOSIS — C680 Malignant neoplasm of urethra: Secondary | ICD-10-CM | POA: Diagnosis not present

## 2015-11-26 DIAGNOSIS — N183 Chronic kidney disease, stage 3 (moderate): Secondary | ICD-10-CM | POA: Diagnosis not present

## 2015-11-26 DIAGNOSIS — Z85118 Personal history of other malignant neoplasm of bronchus and lung: Secondary | ICD-10-CM | POA: Diagnosis not present

## 2015-11-26 DIAGNOSIS — C680 Malignant neoplasm of urethra: Secondary | ICD-10-CM | POA: Diagnosis not present

## 2015-11-26 DIAGNOSIS — Z510 Encounter for antineoplastic radiation therapy: Secondary | ICD-10-CM | POA: Diagnosis not present

## 2015-11-27 DIAGNOSIS — Z510 Encounter for antineoplastic radiation therapy: Secondary | ICD-10-CM | POA: Diagnosis not present

## 2015-11-27 DIAGNOSIS — C61 Malignant neoplasm of prostate: Secondary | ICD-10-CM | POA: Diagnosis not present

## 2015-11-27 DIAGNOSIS — C680 Malignant neoplasm of urethra: Secondary | ICD-10-CM | POA: Diagnosis not present

## 2015-11-27 DIAGNOSIS — Z792 Long term (current) use of antibiotics: Secondary | ICD-10-CM | POA: Diagnosis not present

## 2015-11-28 DIAGNOSIS — C680 Malignant neoplasm of urethra: Secondary | ICD-10-CM | POA: Diagnosis not present

## 2015-11-28 DIAGNOSIS — Z510 Encounter for antineoplastic radiation therapy: Secondary | ICD-10-CM | POA: Diagnosis not present

## 2015-11-29 ENCOUNTER — Ambulatory Visit: Payer: Self-pay

## 2015-11-29 ENCOUNTER — Other Ambulatory Visit: Payer: Self-pay

## 2015-11-29 DIAGNOSIS — C680 Malignant neoplasm of urethra: Secondary | ICD-10-CM | POA: Diagnosis not present

## 2015-11-29 DIAGNOSIS — Z510 Encounter for antineoplastic radiation therapy: Secondary | ICD-10-CM | POA: Diagnosis not present

## 2015-11-29 NOTE — Patient Outreach (Signed)
[facility]) Care Management  11/29/2015  Kyle [name] 27-Nov-1933 [id number]  Telephone call to patient for monthly call.  No answer. HIPAA compliant voice message left.  Plan: RN Health Coach will contact patient within 1-2 weeks.    [name], RN, MSN [place] [phone]

## 2015-11-30 DIAGNOSIS — C680 Malignant neoplasm of urethra: Secondary | ICD-10-CM | POA: Diagnosis not present

## 2015-11-30 DIAGNOSIS — Z510 Encounter for antineoplastic radiation therapy: Secondary | ICD-10-CM | POA: Diagnosis not present

## 2015-11-30 DIAGNOSIS — G4733 Obstructive sleep apnea (adult) (pediatric): Secondary | ICD-10-CM | POA: Diagnosis not present

## 2015-12-01 DIAGNOSIS — C680 Malignant neoplasm of urethra: Secondary | ICD-10-CM | POA: Diagnosis not present

## 2015-12-03 DIAGNOSIS — C680 Malignant neoplasm of urethra: Secondary | ICD-10-CM | POA: Diagnosis not present

## 2015-12-03 DIAGNOSIS — Z510 Encounter for antineoplastic radiation therapy: Secondary | ICD-10-CM | POA: Diagnosis not present

## 2015-12-04 DIAGNOSIS — Z510 Encounter for antineoplastic radiation therapy: Secondary | ICD-10-CM | POA: Diagnosis not present

## 2015-12-04 DIAGNOSIS — C680 Malignant neoplasm of urethra: Secondary | ICD-10-CM | POA: Diagnosis not present

## 2015-12-05 DIAGNOSIS — C680 Malignant neoplasm of urethra: Secondary | ICD-10-CM | POA: Diagnosis not present

## 2015-12-05 DIAGNOSIS — Z510 Encounter for antineoplastic radiation therapy: Secondary | ICD-10-CM | POA: Diagnosis not present

## 2015-12-06 ENCOUNTER — Other Ambulatory Visit: Payer: Self-pay | Admitting: Family Medicine

## 2015-12-06 DIAGNOSIS — Z510 Encounter for antineoplastic radiation therapy: Secondary | ICD-10-CM | POA: Diagnosis not present

## 2015-12-06 DIAGNOSIS — C680 Malignant neoplasm of urethra: Secondary | ICD-10-CM | POA: Diagnosis not present

## 2015-12-07 ENCOUNTER — Other Ambulatory Visit: Payer: Self-pay

## 2015-12-07 DIAGNOSIS — Z510 Encounter for antineoplastic radiation therapy: Secondary | ICD-10-CM | POA: Diagnosis not present

## 2015-12-07 DIAGNOSIS — C680 Malignant neoplasm of urethra: Secondary | ICD-10-CM | POA: Diagnosis not present

## 2015-12-07 NOTE — Patient Outreach (Signed)
[facility]) Care Management  12/07/2015  Kyle [name] [birth year] [id number]  Telephone call to patient for monthly outreach. No answer.  HIPAA compliant voice message left.    Plan: RN Health Coach will attempt patient within 1-2 weeks.   [name], RN, MSN [place] [phone]

## 2015-12-07 NOTE — Patient Outreach (Signed)
[name]) Care Management  [place]  12/07/2015   Kyle [name] 11-03-1933 [id number]  Subjective: Incoming call to patient for monthly call. Patient reports he is tired from his treatments but denies pain. Patient states he has only 8 more treatments.  Patient reports he lost a couple of pounds but appetite remains good. Patient states he was told during his treatment to drink more water. Advised patient drinking more water will help to filter chemotherapy. He verbalized understanding.  Patient reports his last A1c was 5.9.  Encouraged patient to continue the good work with his diet. He verbalized understanding.    Objective:   Encounter Medications:  Outpatient Encounter Prescriptions as of 12/07/2015  Medication Sig Note  . acetaminophen (TYLENOL) 500 MG tablet Take 500 mg by mouth every 6 (six) hours as needed for mild pain.    Marland Kitchen albuterol (PROAIR HFA) 108 (90 BASE) MCG/ACT inhaler Inhale 2 puffs into the lungs every 6 (six) hours as needed for wheezing.   Marland Kitchen ALPRAZolam (XANAX) 0.5 MG tablet TAKE ONE TABLET TWICE DAILY AS NEEDED FOR ANXIETY   . aspirin EC 81 MG tablet Take 81 mg by mouth daily. Reported on 11/22/2015 11/22/2015: Taken off to put in [place] for 7 days)  . calcitRIOL (ROCALTROL) 0.25 MCG capsule    . cetirizine (ZYRTEC) 10 MG tablet Take 1 tablet (10 mg total) by mouth daily.   . citalopram (CELEXA) 20 MG tablet Take 1 tablet (20 mg total) by mouth daily.   . ferrous sulfate 325 (65 FE) MG tablet Take 325 mg by mouth daily with breakfast.   . fluticasone (FLONASE) 50 MCG/ACT nasal spray Place 2 sprays into both nostrils daily as needed for allergies.   Marland Kitchen ipratropium (ATROVENT) 0.06 % nasal spray Place 2 sprays into both nostrils 4 (four) times daily. (Patient taking differently: Place 2 sprays into both nostrils as needed. )   . lisinopril (PRINIVIL,ZESTRIL) 5 MG tablet TAKE ONE (1) TABLET BY MOUTH EVERY DAY   . metoprolol (LOPRESSOR) 50 MG  tablet TAKE ONE TABLET TWICE DAILY   . niacin (NIASPAN) 500 MG CR tablet Take 2 tablets (1,000 mg total) by mouth at bedtime.   . nitroGLYCERIN (NITROSTAT) 0.4 MG SL tablet Place 1 tablet (0.4 mg total) under the tongue every 5 (five) minutes as needed. Call MD if need more than 2   . pravastatin (PRAVACHOL) 80 MG tablet TAKE ONE CAPSULE BY MOUTH DAILY   . tiotropium (SPIRIVA HANDIHALER) 18 MCG inhalation capsule INHALE ONE DOSE BY MOUTH ONCE DAILY    No facility-administered encounter medications on file as of 12/07/2015.    Functional Status:  In your present state of health, do you have any difficulty performing the following activities: 10/26/2015 03/05/2015  Hearing? [name]  Vision? Y Y  Difficulty concentrating or making decisions? [name]  Walking or climbing stairs? Y Y  Dressing or bathing? N N  Doing errands, shopping? N N  Preparing Food and eating ? N N  Using the Toilet? N N  In the past six months, have you accidently leaked urine? N N  Do you have problems with loss of bowel control? N N  Managing your Medications? N N  Managing your Finances? N N  Housekeeping or managing your Housekeeping? N N    Fall/Depression Screening: PHQ 2/9 Scores 12/07/2015 10/26/2015 08/30/2015 04/30/2015 03/05/2015 01/05/2015 12/08/2014  PHQ - 2 Score 0 0 0 0 0 0 0    Assessment: Patient  continues to benefit from health coach outreach for disease management and support.    Plan:  [facility] CM Care Plan Problem One        Most Recent Value   Care Plan Problem One  Diabetes Disease Management needs   Role Documenting the Problem One  Health Coach   THN CM Short Term Goal #1 (0-30 days)  Patient will continue checking cbg's at least once weekly over the next 30 days.   THN CM Short Term Goal #1 Start Date  12/07/15 [goal continued]   Interventions for Short Term Goal #1  RN Health Coach reinforced with patient the importance of checking blood sugars regularly.     THN CM Short Term Goal #3 (0-30 days)  Patient  will continue to work to improve dietary balance by watching carbohydrates and sugars within the next 30 days.   THN CM Short Term Goal #3 Start Date  12/07/15 [goal continued]   Interventions for Short Tern Goal #3  [place] reinforced with patient the importance of watching carbohydrates and limiting sweets.       RN Health Coach will contact patient within one month and patient agrees to next outreach.  [name], RN, MSN [place] [phone]

## 2015-12-10 DIAGNOSIS — C680 Malignant neoplasm of urethra: Secondary | ICD-10-CM | POA: Diagnosis not present

## 2015-12-10 DIAGNOSIS — Z510 Encounter for antineoplastic radiation therapy: Secondary | ICD-10-CM | POA: Diagnosis not present

## 2015-12-11 DIAGNOSIS — Z510 Encounter for antineoplastic radiation therapy: Secondary | ICD-10-CM | POA: Diagnosis not present

## 2015-12-11 DIAGNOSIS — C680 Malignant neoplasm of urethra: Secondary | ICD-10-CM | POA: Diagnosis not present

## 2015-12-12 DIAGNOSIS — C680 Malignant neoplasm of urethra: Secondary | ICD-10-CM | POA: Diagnosis not present

## 2015-12-12 DIAGNOSIS — D696 Thrombocytopenia, unspecified: Secondary | ICD-10-CM | POA: Diagnosis not present

## 2015-12-12 DIAGNOSIS — Z510 Encounter for antineoplastic radiation therapy: Secondary | ICD-10-CM | POA: Diagnosis not present

## 2015-12-12 DIAGNOSIS — R17 Unspecified jaundice: Secondary | ICD-10-CM | POA: Diagnosis not present

## 2015-12-12 DIAGNOSIS — N183 Chronic kidney disease, stage 3 (moderate): Secondary | ICD-10-CM | POA: Diagnosis not present

## 2015-12-13 DIAGNOSIS — C680 Malignant neoplasm of urethra: Secondary | ICD-10-CM | POA: Diagnosis not present

## 2015-12-13 DIAGNOSIS — Z510 Encounter for antineoplastic radiation therapy: Secondary | ICD-10-CM | POA: Diagnosis not present

## 2015-12-14 DIAGNOSIS — Z510 Encounter for antineoplastic radiation therapy: Secondary | ICD-10-CM | POA: Diagnosis not present

## 2015-12-14 DIAGNOSIS — C680 Malignant neoplasm of urethra: Secondary | ICD-10-CM | POA: Diagnosis not present

## 2015-12-18 DIAGNOSIS — Z510 Encounter for antineoplastic radiation therapy: Secondary | ICD-10-CM | POA: Diagnosis not present

## 2015-12-18 DIAGNOSIS — C680 Malignant neoplasm of urethra: Secondary | ICD-10-CM | POA: Diagnosis not present

## 2015-12-18 DIAGNOSIS — J449 Chronic obstructive pulmonary disease, unspecified: Secondary | ICD-10-CM | POA: Diagnosis not present

## 2015-12-19 DIAGNOSIS — Z510 Encounter for antineoplastic radiation therapy: Secondary | ICD-10-CM | POA: Diagnosis not present

## 2015-12-19 DIAGNOSIS — C680 Malignant neoplasm of urethra: Secondary | ICD-10-CM | POA: Diagnosis not present

## 2015-12-20 ENCOUNTER — Ambulatory Visit (INDEPENDENT_AMBULATORY_CARE_PROVIDER_SITE_OTHER): Payer: Medicare Other | Admitting: Otolaryngology

## 2015-12-20 DIAGNOSIS — C680 Malignant neoplasm of urethra: Secondary | ICD-10-CM | POA: Diagnosis not present

## 2015-12-20 DIAGNOSIS — Z510 Encounter for antineoplastic radiation therapy: Secondary | ICD-10-CM | POA: Diagnosis not present

## 2015-12-24 ENCOUNTER — Ambulatory Visit (HOSPITAL_COMMUNITY): Payer: Medicare Other

## 2015-12-24 ENCOUNTER — Ambulatory Visit (HOSPITAL_COMMUNITY)
Admission: RE | Admit: 2015-12-24 | Discharge: 2015-12-24 | Disposition: A | Discharge status: Home / Self Care | Payer: Medicare Other | Source: Ambulatory Visit | Attending: Hematology & Oncology | Admitting: Hematology & Oncology

## 2015-12-24 ENCOUNTER — Other Ambulatory Visit (HOSPITAL_COMMUNITY): Payer: Self-pay | Admitting: Hematology & Oncology

## 2015-12-24 DIAGNOSIS — C3491 Malignant neoplasm of unspecified part of right bronchus or lung: Secondary | ICD-10-CM

## 2015-12-24 DIAGNOSIS — I2510 Atherosclerotic heart disease of native coronary artery without angina pectoris: Secondary | ICD-10-CM | POA: Insufficient documentation

## 2015-12-24 DIAGNOSIS — I7789 Other specified disorders of arteries and arterioles: Secondary | ICD-10-CM | POA: Diagnosis not present

## 2015-12-24 DIAGNOSIS — C3490 Malignant neoplasm of unspecified part of unspecified bronchus or lung: Secondary | ICD-10-CM | POA: Diagnosis not present

## 2015-12-24 LAB — POCT I-STAT CREATININE: CREATININE: 2 mg/dL — AB (ref 0.61–1.24)

## 2015-12-24 MED ORDER — IOPAMIDOL (ISOVUE-300) INJECTION 61%: 75.0000 mL | Freq: Once | INTRAVENOUS | Status: DC | PRN

## 2015-12-28 ENCOUNTER — Encounter (HOSPITAL_COMMUNITY): Payer: Self-pay | Admitting: Hematology & Oncology

## 2015-12-28 ENCOUNTER — Encounter (HOSPITAL_COMMUNITY): Payer: Medicare Other | Attending: Hematology & Oncology | Admitting: Hematology & Oncology

## 2015-12-28 DIAGNOSIS — C3491 Malignant neoplasm of unspecified part of right bronchus or lung: Secondary | ICD-10-CM

## 2015-12-28 DIAGNOSIS — Z720 Tobacco use: Secondary | ICD-10-CM

## 2015-12-28 DIAGNOSIS — D693 Immune thrombocytopenic purpura: Secondary | ICD-10-CM

## 2015-12-28 DIAGNOSIS — N183 Chronic kidney disease, stage 3 (moderate): Secondary | ICD-10-CM | POA: Diagnosis not present

## 2015-12-28 DIAGNOSIS — C3490 Malignant neoplasm of unspecified part of unspecified bronchus or lung: Secondary | ICD-10-CM

## 2015-12-28 DIAGNOSIS — C679 Malignant neoplasm of bladder, unspecified: Secondary | ICD-10-CM

## 2015-12-28 DIAGNOSIS — D696 Thrombocytopenia, unspecified: Secondary | ICD-10-CM

## 2015-12-28 NOTE — Patient Instructions (Signed)
[place] at [facility] Discharge Instructions  RECOMMENDATIONS MADE BY THE CONSULTANT AND ANY TEST RESULTS WILL BE SENT TO YOUR REFERRING PHYSICIAN.  Return to clinic in 3 months with labs  Thank you for choosing [place] at [facility] to provide your oncology and hematology care.  To afford each patient quality time with our provider, please arrive at least 15 minutes before your scheduled appointment time.   Beginning January 23rd 2017 lab work for the [facility] will be done in the  Main lab at [facility] on 1st floor. If you have a lab appointment with the [facility] please come in thru the  Main Entrance and check in at the main information desk  You need to re-schedule your appointment should you arrive 10 or more minutes late.  We strive to give you quality time with our providers, and arriving late affects you and other patients whose appointments are after yours.  Also, if you no show three or more times for appointments you may be dismissed from the clinic at the providers discretion.     Again, thank you for choosing [facility].  Our hope is that these requests will decrease the amount of time that you wait before being seen by our physicians.       _____________________________________________________________  Should you have questions after your visit to [facility], please contact our office at [phone] between the hours of 8:30 a.m. and 4:30 p.m.  Voicemails left after 4:30 p.m. will not be returned until the following business day.  For prescription refill requests, have your pharmacy contact our office.         Resources For Cancer Patients and their Caregivers ? American Cancer Society: Can assist with transportation, wigs, general needs, runs Look Good Feel Better.        (818)121-7168 ? Cancer Care: Provides financial assistance, online support groups, medication/co-pay  assistance.  1-800-813-HOPE 806-752-7101) ? Jane Assists [place] cancer patients and their families through emotional , educational and financial support.  317 515 4943 ? [place] DSS Where to apply for food stamps, Medicaid and utility assistance. 570-249-2949 ? [facility]: Transportation to medical appointments. 857-781-5694 ? Social Security Administration: May apply for disability if have a Stage IV cancer. 614-077-7044 229-545-5696 ? [employer], Disability and Transit Services: Assists with nutrition, care and transit needs. [place] Support Programs: '@10RELATIVEDAYS'$ @ > Cancer Support Group  2nd Tuesday of the month 1pm-2pm, Journey Room  > Creative Journey  3rd Tuesday of the month 1130am-1pm, Journey Room  > Look Good Feel Better  1st Wednesday of the month 10am-12 noon, Journey Room (Call [place] to register [phone])

## 2015-12-28 NOTE — Progress Notes (Signed)
[place] [facility] [place], MD [place] [address]  Stage IA moderately differentiated adenocarcinoma of the lung Thrombocytopenia, chronic Bladder Carcinoma    Adenocarcinoma of right lung ([place])   04/03/2011 Initial Diagnosis INVASIVE MODERATELY DIFFERENTIATED ADENOCARCINOMA, 2.3 CM. (T1b, N0).  Clear margins, no LVI, no pleural involvement. 0/6 nodes.   04/03/2011 Surgery Right lower lobe superior segmentectomy by Dr. Arlyce Dice   04/04/2011 Remission    03/07/2013 Imaging CT of chest-  developing adenopathy in the subcarinal and azygo-esophageal recess stations, mild.  Findings are suspicious for recurrent or localized metastatic disease.    03/16/2013 Imaging PET scan- No definite metabolically active pulmonary lesion and no mediastinal or hilar lymphadenopathy.     06/15/2013 Imaging CT scan of chest- Ill-defined pulmonary density in RLL in the region of patient's prior surgery, less prominent than on prior CT obtained for PET-CT of 02/25/2013. This is most likely region ofscarring. Underlying tumor cannot be excluded   09/19/2013 Imaging CT of chest- Progressive volume loss medially at the right lung base. Differential diagnostic considerations include mucus plugging, radiation therapy related findings, or progressive bronchogenic carcinoma   10/03/2013 Imaging PET scan- Low level residual FDG uptake in the area of previous surgery with moderate atelectasis. I do not see any findings suspicious for recurrent or residual tumor.   10/10/2013 Procedure Bronchcospy with biopsies and brushings by Dr. Roxan Hockey   10/10/2013 Pathology Results RLL biopsies are negative for malignancy   12/24/2015 Imaging CT chest- Posttreatment changes in the right lower lobe with associated volume loss, stable. No evidence of recurrent or metastatic disease.    Bladder carcinoma ([place])   03/27/2011 Initial Diagnosis Bladder carcinoma    - 10/05/2013  Chemotherapy Completed continuous bladder infusion chemotherapy at [facility]    CURRENT THERAPY: Surveillance per NCCN guidelines.  INTERVAL HISTORY: Kyle [name] 82 y.o. male returns for  regular  visit for followup of Stage I a (T1 B., N0, M0) moderately differentiated adenocarcinoma the lung, 2.3 cm in size with surgery on 04/03/2011. Margins were clear, no LV I was seen, and there is no pleural involvement. 6 lymph nodes were all negative getting stage IA disease.  AND Thrombocytopenia thus far consistent with chronic ITP of adults versus drug-induced mild thrombocytopenia. He is on too many medications to withdraw them completely. There is no evidence on imaging of splenomegaly or cirrhosis.   He has just completed concurrent chemoradiation at [facility] for bladder cancer.   He says that Dr. Lawerance Bach is his urologist, at [facility]. His urologist set him up with a different oncologist, at [facility]. He confirms that he's done with chemo and radiation.   When asked about his appetite, he notes that he's eating pretty good. He denies any sore mouth or sore hands during his treatment, "just diarrhea and constipation." When asked if his diarrhea is gone, he notes "I think so, constipation." He sees Dr. Pablo Ledger again in about a month. He isn't sure when he will see his urologist for a scope again, but believes it will be around July. His urologist is in [place] on Mondays and Fridays, on [address].  He notes that he now smokes "very seldom," and was advised to quit completely. He denies any bleeding or bruising.   Past Medical History  Diagnosis Date  . Arteriosclerotic cardiovascular disease (ASCVD) 1973, 12/2010    S/P NSTEMI secondary to distal RCA/PL lesion, tx medically.  EF of  55%-60% per  echo.  . Diabetes mellitus     Type II  . Thrombocytopenia ([place])   . COPD (chronic obstructive pulmonary disease) ([place])   . AAA (abdominal aortic aneurysm) ([place]) 2004    s/p  repair 2004; 4.3 cm infrarenal in 05/2011  . Tobacco abuse     50-pack-year consumption; quit in 12/2010  . Hyperlipidemia   . OSA (obstructive sleep apnea)   . Hypertension   . Benign prostatic hypertrophy     s/p transurethral resection of the prostate  . Obesity   . Tubular adenoma of colon   . Cataract   . Ventral hernia   . Diverticulosis   . Bilateral renal masses     Cystic, more prominent on CT in 12/2010 than 2007; followed by Dr. Rosana Hoes  . ITP (idiopathic thrombocytopenic purpura) 09/07/2012    Chronic ITP of adults versus medication-induced ITP.  Stable  . Insomnia   . Bladder cancer [facility]) 1996    Transurethral resection of the bladder + chemotherapy/BCG as premed  . Adenocarcinoma, lung ([place]) 01/2011    transthoracic FNA; resection of the superior segment of the RLL in 03/2011; negative nodes; no chemotherapy nor radiation planned  . Chronic kidney disease     Creatinine 1.4 on discharge 12/20/2100; proteinuria; normal renal ultrasound in 2010; recent creatinines of 1.7-2.; Bilateral cystic renal masses by CT in 2011  . Nephrolithiasis 2012    ARF in 01/2011 due to obstructing nephrolithiasis  . Arm fracture     right arm  . Essential and other specified forms of tremor 02/23/2013  . Diplopia 02/23/2013  . Polyneuropathy in diabetes(357.2) 02/23/2013  . Abnormality of gait 02/23/2013  . Hx of Clostridium difficile infection   . Adenocarcinoma of right lung ([place]) 02/24/2011    Ct A/P 2012:  2cm lung mass RLL PET 9326:  Hypermetabolic RLL mass, no other hypermetabolic areas. TTNA 02/2011:  Adenocarcinoma, markers c/w lung origin Right lower lobe superior segmentectomy. 04/01/2011 Dr. Arlyce Dice   . Coronary artery disease   . Myocardial infarction ([place])   . Cough     thick phlegm    has Arteriosclerotic cardiovascular disease (ASCVD); Hypertension; CKD (chronic kidney disease) stage 3, GFR 30-59 ml/min; COPD (chronic obstructive pulmonary disease) ([place]); Adenocarcinoma of right lung ([place]);  Obstructive sleep apnea; Fasting hyperglycemia; Nephrolithiasis; Bladder carcinoma ([place]); Diarrhea; Tubular adenoma of colon; Ventral hernia; AAA (abdominal aortic aneurysm) ([place]); Tobacco abuse; LLQ pain; ITP (idiopathic thrombocytopenic purpura); Diabetes ([place]); Fracture of right distal radius; COPD exacerbation ([place]); Essential and other specified forms of tremor; Diplopia; Diabetic polyneuropathy ([place]); Abnormality of gait; History of colonic polyps; Diverticulosis of colon without hemorrhage; Tremor, essential; Cough; and Allergic rhinitis on his problem list.     is allergic to codeine and etodolac.  Mr. Shanahan does not currently have medications on file.  Past Surgical History  Procedure Laterality Date  . Transurethral resection of prostate    . Cystectomy    . Tonsillectomy    . Colonoscopy  03/19/2010    Dr. Gala Romney -(poor prep) Anal papilla, rectal hyperplastic polyp, tubular adenoma removed splenic flexure, left-sided diverticula  . Wedge resection  04/2011    carcinoma of lung  . Colonoscopy  11/28/2004    RMR:  Diminutive rectal and left colon polyps as described above, cold  biopsied/removed/  Left sided diverticula. The remainder of the colonic mucosa appeared normal.  . Colonoscopy   09/15/01    RMR: Multiple diminutive polyps destroyed with dermolysis  as described above/ Multiple small polyps on stalks in the colon resected with snare cautery/ Scattered pan colonic diverticulum/ The remainder of the colonic mucosa appeared normal  . Abdominal aortic aneurysm repair  2004  . Cardiac catheterization    . Lung lobectomy    . Video bronchoscopy with endobronchial navigation N/A 10/10/2013    Procedure: VIDEO BRONCHOSCOPY WITH ENDOBRONCHIAL NAVIGATION;  Surgeon: Melrose Nakayama, MD;  Location: [place];  Service: Thoracic;  Laterality: N/A;  NO BLOOD THINNERS BUT PATIENT HAS ITP  . Cystoscopy  04/2014  . Flexible sigmoidoscopy  2014    Dr. Olevia Perches: tubular adenoma, negative stool  studies   . Colonoscopy N/A 05/01/2015    Procedure: COLONOSCOPY;  Surgeon: Daneil Dolin, MD;  [facility];  Service: Endoscopy;  Laterality: N/A;  1115  . Cystostomy w/ bladder biopsy      Denies any headaches, dizziness, double vision, fevers, chills, night sweats, nausea, vomiting, diarrhea, chest pain, heart palpitations, shortness of breath, blood in stool, black tarry stool, urinary pain, urinary burning, urinary frequency, hematuria. Positive for constipation and joint pain. 14 point review of systems was performed and is negative except as detailed under history of present illness and above   PHYSICAL EXAMINATION  ECOG PERFORMANCE STATUS: 1 - Symptomatic but completely ambulatory  GENERAL:alert, no distress, well nourished, well developed, comfortable, cooperative, obese and smiling SKIN: skin color, texture, turgor are normal, no rashes or significant lesions HEAD: Normocephalic, No masses, lesions, tenderness or abnormalities EYES: normal, PERRLA, EOMI, Conjunctiva are pink and non-injected EARS: External ears normal OROPHARYNX:mucous membranes are moist  NECK: supple, trachea midline LYMPH:  No axillary adenopathy, no adenopathy in the neck or supraclavicular areas LUNGS: clear to auscultation  HEART: regular rate & rhythm, no murmurs and no gallops ABDOMEN:obese Large abdominal wall Hernia, easily reduced. BACK: Back symmetric, no curvature. EXTREMITIES:less then 2 second capillary refill, no skin discoloration, no cyanosis  NEURO: alert & oriented x 3 with fluent speech, no focal motor/sensory deficits, gait is slow, slight limp   LABORATORY DATA: I have reviewed the data as listed.      Study Result     CLINICAL DATA: Restaging lung cancer. History of bladder/ urethral cancer.  EXAM: CT CHEST WITHOUT CONTRAST  TECHNIQUE: Multidetector CT imaging of the chest was performed following the standard protocol without IV  contrast.  COMPARISON: CT abdomen pelvis 09/05/2015 and CT chest 12/25/2014.  FINDINGS: Mediastinum/Nodes: No pathologically enlarged mediastinal or axillary lymph nodes. Hilar regions are difficult to definitively evaluate without IV contrast. Atherosclerotic calcification of the arterial vasculature, including coronary arteries. Pulmonary arteries are enlarged. Heart size within normal limits. No pericardial effusion.  Lungs/Pleura: Postoperative changes and posttreatment consolidation/volume loss in the right lower lobe, stable. Scattered pulmonary parenchymal scarring. Centrilobular emphysema. No pleural fluid. Airway is otherwise unremarkable.  Upper abdomen: Visualized portions of the liver, adrenal glands, spleen, pancreas and stomach are grossly unremarkable.  Musculoskeletal: No worrisome lytic or sclerotic lesions. Degenerative changes are seen in the spine. Flowing anterior osteophytosis in the thoracic spine.  IMPRESSION: 1. Posttreatment changes in the right lower lobe with associated volume loss, stable. No evidence of recurrent or metastatic disease. 2. Coronary artery calcification. 3. Enlarged pulmonary arteries, indicative of pulmonary arterial hypertension.   Electronically Signed  By: Lorin Picket M.D.  On: 12/24/2015 12:13     PATHOLOGY:  10/10/2013  Diagnosis 1. Lung, biopsy, RLL - BENIGN LUNG TISSUE, SEE COMMENT. - NEGATIVE FOR ATYPIA OR MALIGNANCY. 2. Lung, biopsy,  RLL - BENIGN LUNG TISSUE, SEE COMMENT. - NEGATIVE FOR ATYPIA OR MALIGNANCY. Microscopic Comment 1. Multiple biopsies demonstrate non neoplastic lung with reactive bronchial respiratory-type epithelium overlying thickened basement membrane. Within the subepithelial tissue, there is prominent lymphoplasmacytic inflammation and edema. There are no features of epithelial dysplasia or malignancy present. The case was reviewed with Dr. Gari Crown who concurs. 2. Multiple  biopsies demonstrate non neoplastic lung with reactive bronchial respiratory-type epithelium overlying thickened basement. Within the subepithelial tissue, there is chronic lymphoplasmacytic inflammation, fibroelastotic stromal change, and edema. There is incidental oxyntic metaplasia of benign salivary-type glands. There are no features of epithelial dysplasia or malignancy present. (CRR:gt, 10/12/13) Mali RUND DO Pathologist, Electronic Signature (Case signed 10/12/2013)    ASSESSMENT:  1. Stage I a (T1 B., N0, M0) moderately differentiated adenocarcinoma the lung, 2.3 cm in size with surgery on 04/03/2011. Margins were clear, no LV I was seen, and there is no pleural involvement. 6 lymph nodes were all negative getting stage IA disease.  2. Thrombocytopenia thus far consistent with chronic ITP of adults versus drug-induced mild thrombocytopenia. He is on too many medications to withdraw them completely. There is no evidence on his May 2013 CT scan of splenomegaly or cirrhosis. Stable.  3. Obesity  4. Intermittent diarrhea treated by GI  5. Bladder cancer followed by Dr. Rosana Hoes, s/p concurrent chemoxrt at [facility] with Dr. Tressie Stalker 6. MI x2 the last in May 2012  7. Abdominal hernia secondary to abdominal aortic aneurysm surgery in 2008  8. Double vision in the past which has not been a recent issue  9. History of skin cancer  10. Chronic renal disease, grade 3, followed by nephrologist  11. Elevated glucose, followed by Dr. Wolfgang Phoenix. 12. S/P bronchoscopy by Dr. Roxan Hockey on 10/10/2013 with biopsies and they are negative.  Patient Active Problem List   Diagnosis Date Noted  . Cough 10/03/2015  . Allergic rhinitis 10/03/2015  . Tremor, essential 08/27/2015  . History of colonic polyps   . Diverticulosis of colon without hemorrhage   . Essential and other specified forms of tremor 02/23/2013  . Diplopia 02/23/2013  . Diabetic polyneuropathy ([place]) 02/23/2013  . Abnormality of gait  02/23/2013  . COPD exacerbation ([place]) 01/31/2013  . Fracture of right distal radius 12/14/2012  . Diabetes ([place]) 11/30/2012  . ITP (idiopathic thrombocytopenic purpura) 09/07/2012  . LLQ pain 11/13/2011  . Ventral hernia 10/28/2011  . AAA (abdominal aortic aneurysm) ([place])   . Tobacco abuse   . Diarrhea 10/22/2011  . Tubular adenoma of colon   . Obstructive sleep apnea 03/27/2011  . Fasting hyperglycemia 03/27/2011  . Nephrolithiasis 03/27/2011  . Bladder carcinoma ([place]) 03/27/2011  . COPD (chronic obstructive pulmonary disease) ([place]) 02/24/2011  . Adenocarcinoma of right lung ([place]) 02/24/2011  . Arteriosclerotic cardiovascular disease (ASCVD) 01/06/2011  . Hypertension 01/06/2011  . CKD (chronic kidney disease) stage 3, GFR 30-59 ml/min 01/06/2011     PLAN:   I reviewed the CT scans with the patient.  We will continue with ongoing CT surveillance as recommended in accordance with the NCCN guidelines.  NCCN guidelines for Non-Small Cell Lung Cancer Surveillance in the setting of clinical/radiographic remission are as follows (4.2017):  A. Stage I-II (primary treatment included surgery +/- chemotherapy):   1. H+P and chest CT +/- contrast every 6 months for 2-3 years, then H+P and low-dose non-contrast-enhanced chest CT annually   We addressed his ongoing tobacco use and its relationship to his bladder cancer and lung cancer. He is  aware of this but is not interested in discontinuing smoking.  In regards to his chronic thrombocytopenia, counts are stable.  Follow up in 6 months   Orders Placed This Encounter  Procedures  . CBC with Differential    Standing Status: Future     Number of Occurrences:      Standing Expiration Date: 12/27/2016  . Comprehensive metabolic panel    Standing Status: Future     Number of Occurrences:      Standing Expiration Date: 12/27/2016   All questions were answered. The patient knows to call the clinic with any problems, questions or concerns.  We can certainly see the patient much sooner if necessary.  This document serves as a record of services personally performed by Ancil Linsey, MD. It was created on her behalf by Toni Amend, a trained medical scribe. The creation of this record is based on the scribe's personal observations and the provider's statements to them. This document has been checked and approved by the attending provider.  I have reviewed the above documentation for accuracy and completeness, and I agree with the above.  This note was electronically signed. Molli Hazard, MD  12/28/2015

## 2016-01-02 ENCOUNTER — Other Ambulatory Visit: Payer: Self-pay

## 2016-01-02 NOTE — Patient Outreach (Signed)
[place] [facility]) Care Management  [place]  01/02/2016   Kyle [name] 1934/01/06 [id number]  Subjective: Telephone call to patient for monthly call. Patient reports he is finished with treatments but still dealing with being tired. Patient not checking sugars regularly as A1c was 5.9. Discussed with patient the importance of maintaining low carbohydrate diet in order to keep A1c down.  He verbalized understanding.    Objective:   Encounter Medications:  Outpatient Encounter Prescriptions as of 01/02/2016  Medication Sig Note  . acetaminophen (TYLENOL) 500 MG tablet Take 500 mg by mouth every 6 (six) hours as needed for mild pain.    Marland Kitchen albuterol (PROAIR HFA) 108 (90 BASE) MCG/ACT inhaler Inhale 2 puffs into the lungs every 6 (six) hours as needed for wheezing.   Marland Kitchen ALPRAZolam (XANAX) 0.5 MG tablet TAKE ONE TABLET TWICE DAILY AS NEEDED FOR ANXIETY   . aspirin EC 81 MG tablet Take 81 mg by mouth daily. Reported on 11/22/2015 11/22/2015: Taken off to put in [place] for 7 days)  . calcitRIOL (ROCALTROL) 0.25 MCG capsule    . cetirizine (ZYRTEC) 10 MG tablet Take 1 tablet (10 mg total) by mouth daily.   . citalopram (CELEXA) 20 MG tablet Take 1 tablet (20 mg total) by mouth daily.   . ferrous sulfate 325 (65 FE) MG tablet Take 325 mg by mouth daily with breakfast.   . fluticasone (FLONASE) 50 MCG/ACT nasal spray Place 2 sprays into both nostrils daily as needed for allergies.   Marland Kitchen ipratropium (ATROVENT) 0.06 % nasal spray Place 2 sprays into both nostrils 4 (four) times daily. (Patient taking differently: Place 2 sprays into both nostrils as needed. )   . lisinopril (PRINIVIL,ZESTRIL) 5 MG tablet TAKE ONE (1) TABLET BY MOUTH EVERY DAY   . metoprolol (LOPRESSOR) 50 MG tablet TAKE ONE TABLET TWICE DAILY   . niacin (NIASPAN) 500 MG CR tablet Take 2 tablets (1,000 mg total) by mouth at bedtime.   . nitroGLYCERIN (NITROSTAT) 0.4 MG SL tablet Place 1 tablet (0.4 mg total) under  the tongue every 5 (five) minutes as needed. Call MD if need more than 2   . pravastatin (PRAVACHOL) 80 MG tablet TAKE ONE CAPSULE BY MOUTH DAILY   . tiotropium (SPIRIVA HANDIHALER) 18 MCG inhalation capsule INHALE ONE DOSE BY MOUTH ONCE DAILY    No facility-administered encounter medications on file as of 01/02/2016.    Functional Status:  In your present state of health, do you have any difficulty performing the following activities: 10/26/2015 03/05/2015  Hearing? [name]  Vision? Y Y  Difficulty concentrating or making decisions? [name]  Walking or climbing stairs? Y Y  Dressing or bathing? N N  Doing errands, shopping? N N  Preparing Food and eating ? N N  Using the Toilet? N N  In the past six months, have you accidently leaked urine? N N  Do you have problems with loss of bowel control? N N  Managing your Medications? N N  Managing your Finances? N N  Housekeeping or managing your Housekeeping? N N    Fall/Depression Screening: PHQ 2/9 Scores 12/07/2015 10/26/2015 08/30/2015 04/30/2015 03/05/2015 01/05/2015 12/08/2014  PHQ - 2 Score 0 0 0 0 0 0 0    Assessment: Patient continues to benefit from health coach outreach for disease management and support.    Plan:  [facility] CM Care Plan Problem One        Most Recent Value   Care Plan  Problem One  Diabetes Disease Management needs   Role Documenting the Problem One  [place] for Problem One  Active   THN Long Term Goal (31-90 days)  Patient will keep A1c less than 6.0 within 90 days.    THN Long Term Goal Start Date  01/02/16   Interventions for Problem One Long Term Goal  RN Health Coach discussed importance of diet in kepping A1c low.    THN CM Short Term Goal #1 (0-30 days)  Patient will continue checking cbg's at least once weekly over the next 30 days.   THN CM Short Term Goal #1 Start Date  12/07/15   THN CM Short Term Goal #1 Met Date  -- [Goal not met patient only checks blood sugar as needed. ]   Interventions for  Short Term Goal #1  Patient not currently willing to work on goal.     THN CM Short Term Goal #3 (0-30 days)  Patient will continue to work to improve dietary balance by watching carbohydrates and sugars within the next 30 days.   THN CM Short Term Goal #3 Start Date  01/02/16 Barrie Folk continued]   Interventions for Short [place]ern Goal #3  [place] reviewed with patient the importance of watching carbohydrates and limiting sweets.       RN Health Coach will contact patient within one month and patient agrees to next outreach.  [name], RN, MSN [place] [phone]

## 2016-01-07 ENCOUNTER — Other Ambulatory Visit: Payer: Self-pay | Admitting: Family Medicine

## 2016-01-07 DIAGNOSIS — R309 Painful micturition, unspecified: Secondary | ICD-10-CM | POA: Diagnosis not present

## 2016-01-07 DIAGNOSIS — D696 Thrombocytopenia, unspecified: Secondary | ICD-10-CM | POA: Diagnosis not present

## 2016-01-07 DIAGNOSIS — N183 Chronic kidney disease, stage 3 (moderate): Secondary | ICD-10-CM | POA: Diagnosis not present

## 2016-01-07 DIAGNOSIS — C680 Malignant neoplasm of urethra: Secondary | ICD-10-CM | POA: Diagnosis not present

## 2016-01-14 ENCOUNTER — Encounter (HOSPITAL_COMMUNITY): Payer: Self-pay | Admitting: Hematology & Oncology

## 2016-01-18 DIAGNOSIS — J449 Chronic obstructive pulmonary disease, unspecified: Secondary | ICD-10-CM | POA: Diagnosis not present

## 2016-01-21 ENCOUNTER — Other Ambulatory Visit: Payer: Self-pay

## 2016-01-25 DIAGNOSIS — Z923 Personal history of irradiation: Secondary | ICD-10-CM | POA: Diagnosis not present

## 2016-01-25 DIAGNOSIS — C680 Malignant neoplasm of urethra: Secondary | ICD-10-CM | POA: Diagnosis not present

## 2016-01-28 ENCOUNTER — Other Ambulatory Visit: Payer: Self-pay | Admitting: Family Medicine

## 2016-01-30 ENCOUNTER — Other Ambulatory Visit: Payer: Self-pay

## 2016-01-30 NOTE — Patient Outreach (Signed)
[place] [facility]) Care Management  01/30/2016  Kyle [name] May 30, 1934 [id number]   Telephone call to patient for monthly call.  Wife answers and states patient not in.  Advised health coach would call back another time.  She verbalized understanding.    Plan: RN Health Coach will attempt patient again during the month of July.    [name], RN, MSN [place] [phone]

## 2016-02-11 ENCOUNTER — Telehealth (HOSPITAL_COMMUNITY): Payer: Self-pay | Admitting: Hematology & Oncology

## 2016-02-11 DIAGNOSIS — C670 Malignant neoplasm of trigone of bladder: Secondary | ICD-10-CM | POA: Diagnosis not present

## 2016-02-11 NOTE — Telephone Encounter (Signed)
Spoke with pt and gave him appt info

## 2016-02-12 ENCOUNTER — Other Ambulatory Visit: Payer: Self-pay | Admitting: Family Medicine

## 2016-02-12 DIAGNOSIS — C670 Malignant neoplasm of trigone of bladder: Secondary | ICD-10-CM | POA: Insufficient documentation

## 2016-02-15 ENCOUNTER — Other Ambulatory Visit: Payer: Self-pay

## 2016-02-15 ENCOUNTER — Encounter (HOSPITAL_COMMUNITY): Payer: Self-pay | Admitting: Hematology

## 2016-02-15 ENCOUNTER — Encounter (HOSPITAL_COMMUNITY): Payer: Medicare Other | Attending: Hematology & Oncology | Admitting: Hematology

## 2016-02-15 VITALS — BP 143/67 | HR 66 | Temp 97.7°F | Resp 18 | Ht 71.0 in | Wt 266.4 lb

## 2016-02-15 DIAGNOSIS — C680 Malignant neoplasm of urethra: Secondary | ICD-10-CM | POA: Diagnosis not present

## 2016-02-15 DIAGNOSIS — C679 Malignant neoplasm of bladder, unspecified: Secondary | ICD-10-CM

## 2016-02-15 NOTE — Patient Outreach (Signed)
[facility]) Care Management  [place]  02/15/2016   REG BIRCHER [birth year] [id number]  Subjective: Telephone call to patient for monthly call.  Patient reports he continues to have some tiredness but is doing well. Patient reports on August 15th he is to have some biopsies done for bladder cancer.  Patient reports the highest his blood sugar has been was 150. Patient reports he is checking his sugars at least once a week.  Encouraged patient to continue to monitor sugars and diet to keep A1c down.  He verbalized understanding.   Objective:   Encounter Medications:  Outpatient Encounter Prescriptions as of 02/15/2016  Medication Sig Note  . acetaminophen (TYLENOL) 500 MG tablet Take 500 mg by mouth every 6 (six) hours as needed for mild pain.    Marland Kitchen albuterol (PROAIR HFA) 108 (90 BASE) MCG/ACT inhaler Inhale 2 puffs into the lungs every 6 (six) hours as needed for wheezing.   Marland Kitchen ALPRAZolam (XANAX) 0.5 MG tablet TAKE ONE TABLET TWICE DAILY AS NEEDED FOR ANXIETY   . aspirin EC 81 MG tablet Take 81 mg by mouth daily. Reported on 11/22/2015 11/22/2015: Taken off to put in [place] for 7 days)  . calcitRIOL (ROCALTROL) 0.25 MCG capsule    . cetirizine (ZYRTEC) 10 MG tablet Take 1 tablet (10 mg total) by mouth daily.   . citalopram (CELEXA) 20 MG tablet TAKE ONE (1) TABLET BY MOUTH EVERY DAY   . ferrous sulfate 325 (65 FE) MG tablet Take 325 mg by mouth daily with breakfast.   . fluticasone (FLONASE) 50 MCG/ACT nasal spray Place 2 sprays into both nostrils daily as needed for allergies.   Marland Kitchen ipratropium (ATROVENT) 0.06 % nasal spray Place 2 sprays into both nostrils 4 (four) times daily. (Patient taking differently: Place 2 sprays into both nostrils as needed. )   . lisinopril (PRINIVIL,ZESTRIL) 5 MG tablet TAKE ONE TABLET ONCE DAILY   . metoprolol (LOPRESSOR) 50 MG tablet TAKE ONE TABLET TWICE DAILY   . niacin (NIASPAN) 500 MG CR tablet Take 2 tablets (1,000 mg total) by  mouth at bedtime.   . nitroGLYCERIN (NITROSTAT) 0.4 MG SL tablet Place 1 tablet (0.4 mg total) under the tongue every 5 (five) minutes as needed. Call MD if need more than 2   . pravastatin (PRAVACHOL) 80 MG tablet TAKE ONE (1) TABLET EACH DAY   . SPIRIVA HANDIHALER 18 MCG inhalation capsule INHALE 1 DOSE BY MOUTH ONCE DAILY    No facility-administered encounter medications on file as of 02/15/2016.     Functional Status:  In your present state of health, do you have any difficulty performing the following activities: 10/26/2015 03/05/2015  Hearing? [name]  Vision? Y Y  Difficulty concentrating or making decisions? [name]  Walking or climbing stairs? Y Y  Dressing or bathing? N N  Doing errands, shopping? N N  Preparing Food and eating ? N N  Using the Toilet? N N  In the past six months, have you accidently leaked urine? N N  Do you have problems with loss of bowel control? N N  Managing your Medications? N N  Managing your Finances? N N  Housekeeping or managing your Housekeeping? N N  Some recent data might be hidden    Fall/Depression Screening: PHQ 2/9 Scores 02/15/2016 12/07/2015 10/26/2015 08/30/2015 04/30/2015 03/05/2015 01/05/2015  PHQ - 2 Score 0 0 0 0 0 0 0    Assessment: Patient continues to benefit from health coach outreach  for disease management and support.    Plan:  [facility] CM Care Plan Problem One   Flowsheet Row Most Recent Value  Care Plan Problem One  Diabetes Disease Management needs  Role Documenting the Problem One  Health [place] for Problem One  Active  THN Long Term Goal (31-90 days)  Patient will keep A1c less than 6.0 within 90 days.   THN Long Term Goal Start Date  01/02/16 [goal continued]  Interventions for Problem One Long Term Goal  RN Health Coach reviewed importance of diet in kepping A1c low.   THN CM Short Term Goal #1 (0-30 days)  Patient will continue checking cbg's at least once weekly over the next 30 days.  THN CM Short Term Goal #1 Start Date   12/07/15  [facility] CM Short Term Goal #1 Met Date  02/15/16  Interventions for Short Term Goal #1  Patient currently checking sugars weekly.   THN CM Short Term Goal #3 (0-30 days)  Patient will continue to work to improve dietary balance by watching carbohydrates and sugars within the next 30 days.  THN CM Short Term Goal #3 Start Date  02/15/16 Barrie Folk continued]  Interventions for Short Tern Goal #3  Benjamin discussed with patient the importance of watching carbohydrates and limiting sweets.        RN Health Coach will contact patient in the month of August and patient agrees to next outreach.  [name], RN, MSN [place] [phone]

## 2016-02-15 NOTE — Patient Instructions (Signed)
[place] [facility]  Discharge Instructions:  Seen by Sullivan Lone, MD today. _______________________________________________________________  Thank you for choosing [place] [facility] to provide your oncology and hematology care.  To afford each patient quality time with our providers, please arrive at least 15 minutes before your scheduled appointment.  You need to re-schedule your appointment if you arrive 10 or more minutes late.  We strive to give you quality time with our providers, and arriving late affects you and other patients whose appointments are after yours.  Also, if you no show three or more times for appointments you may be dismissed from the clinic.  Again, thank you for choosing [place] [facility] [place] hope is that these requests will allow you access to exceptional care and in a timely manner. _______________________________________________________________  If you have questions after your visit, please contact our office at [phone] between the hours of 8:30 a.m. and 5:00 p.m. Voicemails left after 4:30 p.m. will not be returned until the following business day. _______________________________________________________________  For prescription refill requests, have your pharmacy contact our office. _______________________________________________________________  Recommendations made by the consultant and any test results will be sent to your referring physician. _______________________________________________________________

## 2016-02-17 DIAGNOSIS — J449 Chronic obstructive pulmonary disease, unspecified: Secondary | ICD-10-CM | POA: Diagnosis not present

## 2016-02-22 ENCOUNTER — Encounter: Payer: Self-pay | Admitting: Family Medicine

## 2016-02-22 ENCOUNTER — Ambulatory Visit (HOSPITAL_COMMUNITY)
Admission: RE | Admit: 2016-02-22 | Discharge: 2016-02-22 | Disposition: A | Discharge status: Home / Self Care | Payer: Medicare Other | Source: Ambulatory Visit | Attending: Family Medicine | Admitting: Family Medicine

## 2016-02-22 ENCOUNTER — Ambulatory Visit (INDEPENDENT_AMBULATORY_CARE_PROVIDER_SITE_OTHER): Payer: Medicare Other | Admitting: Family Medicine

## 2016-02-22 VITALS — BP 122/74 | Ht 72.0 in | Wt 270.0 lb

## 2016-02-22 DIAGNOSIS — M25562 Pain in left knee: Secondary | ICD-10-CM | POA: Insufficient documentation

## 2016-02-22 DIAGNOSIS — S8992XA Unspecified injury of left lower leg, initial encounter: Secondary | ICD-10-CM | POA: Diagnosis not present

## 2016-02-22 DIAGNOSIS — E119 Type 2 diabetes mellitus without complications: Secondary | ICD-10-CM

## 2016-02-22 DIAGNOSIS — Z79899 Other long term (current) drug therapy: Secondary | ICD-10-CM

## 2016-02-22 DIAGNOSIS — E785 Hyperlipidemia, unspecified: Secondary | ICD-10-CM

## 2016-02-22 LAB — POCT GLYCOSYLATED HEMOGLOBIN (HGB A1C): HEMOGLOBIN A1C: 5.2

## 2016-02-22 NOTE — Progress Notes (Signed)
   Subjective:    Patient ID: Kyle [name], male    DOB: 15-Nov-1933, 82 y.o.   MRN: [id number]  Diabetes  He presents for his follow-up diabetic visit. He has type 2 diabetes mellitus. Current diabetic treatment includes diet. He is compliant with treatment all of the time. He is following a diabetic diet. He never participates in exercise. Home blood sugar record trend: 90 -150. He sees a podiatrist.Eye exam is current.   A1C 5.2 Lowest sugar 97 up to 154  Fell about 3 weeks ago and hurt left knee. Overall bruise on knee and leg is feeling somewhat better. Of concern is that when patient fell he was unable to get up and had to call for help  Check bruise on left.   Eyes start itching when outside for awhile. Has upcoming app aug 31st with eye doctor.   Patient continues to take lipid medication regularly. No obvious side effects from it. Generally does not miss a dose. Prior blood work results are reviewed with patient. Patient continues to work on fat intake in diet     Review of Systems No headache, no major weight loss or weight gain, no chest pain no back pain abdominal pain no change in bowel habits complete ROS otherwise negative     Objective:   Physical Exam  Substantial obesity present vitals stable blood pressure good on repeat lungs clear heart rare rhythm left knee crepitations evident with extension and flexion lower ankle contusion with hematoma resulting      Assessment & Plan:  #1 impression worsening knee probable arthritis left worse than right discussed may increase to Tylenol 2 twice a day. Encourage regular walking #2 type 2 diabetes control excellent tight A1c #3 hyperlipidemia status uncertain #4 current cancer therapy discussed #5 obesity and deconditioning exercise discussed in encourage follow-up as scheduled further recommendations based results

## 2016-02-23 LAB — MICROALBUMIN / CREATININE URINE RATIO
CREATININE, UR: 67 mg/dL
MICROALB/CREAT RATIO: 574.9 mg/g creat — ABNORMAL HIGH (ref 0.0–30.0)
Microalbumin, Urine: 385.2 ug/mL

## 2016-02-23 LAB — BASIC METABOLIC PANEL
BUN / CREAT RATIO: 13 (ref 10–24)
BUN: 25 mg/dL (ref 8–27)
CHLORIDE: 107 mmol/L — AB (ref 96–106)
CO2: 24 mmol/L (ref 18–29)
CREATININE: 1.99 mg/dL — AB (ref 0.76–1.27)
Calcium: 9.4 mg/dL (ref 8.6–10.2)
GFR calc Af Amer: 36 mL/min/{1.73_m2} — ABNORMAL LOW (ref >59)
GFR calc non Af Amer: 31 mL/min/{1.73_m2} — ABNORMAL LOW (ref >59)
GLUCOSE: 133 mg/dL — AB (ref 65–99)
Potassium: 4.7 mmol/L (ref 3.5–5.2)
SODIUM: 145 mmol/L — AB (ref 134–144)

## 2016-02-23 LAB — HEPATIC FUNCTION PANEL
ALK PHOS: 60 IU/L (ref 39–117)
ALT: 12 IU/L (ref 0–44)
AST: 14 IU/L (ref 0–40)
Albumin: 4.2 g/dL (ref 3.5–4.8)
BILIRUBIN, DIRECT: 0.27 mg/dL (ref 0.00–0.40)
Bilirubin Total: 1.2 mg/dL (ref 0.0–1.2)
Total Protein: 6.4 g/dL (ref 6.0–8.5)

## 2016-02-23 LAB — LIPID PANEL
CHOLESTEROL TOTAL: 145 mg/dL (ref 100–199)
Chol/HDL Ratio: 4.8 ratio units (ref 0.0–5.0)
HDL: 30 mg/dL — AB (ref >39)
LDL Calculated: 69 mg/dL (ref 0–99)
TRIGLYCERIDES: 230 mg/dL — AB (ref 0–149)
VLDL Cholesterol Cal: 46 mg/dL — ABNORMAL HIGH (ref 5–40)

## 2016-02-27 DIAGNOSIS — J449 Chronic obstructive pulmonary disease, unspecified: Secondary | ICD-10-CM | POA: Diagnosis not present

## 2016-02-27 DIAGNOSIS — I129 Hypertensive chronic kidney disease with stage 1 through stage 4 chronic kidney disease, or unspecified chronic kidney disease: Secondary | ICD-10-CM | POA: Diagnosis not present

## 2016-02-27 DIAGNOSIS — I2510 Atherosclerotic heart disease of native coronary artery without angina pectoris: Secondary | ICD-10-CM | POA: Diagnosis not present

## 2016-02-27 DIAGNOSIS — Z87442 Personal history of urinary calculi: Secondary | ICD-10-CM | POA: Diagnosis not present

## 2016-02-27 DIAGNOSIS — E1122 Type 2 diabetes mellitus with diabetic chronic kidney disease: Secondary | ICD-10-CM | POA: Diagnosis not present

## 2016-02-27 DIAGNOSIS — D649 Anemia, unspecified: Secondary | ICD-10-CM | POA: Diagnosis not present

## 2016-02-27 DIAGNOSIS — M1990 Unspecified osteoarthritis, unspecified site: Secondary | ICD-10-CM | POA: Diagnosis not present

## 2016-02-27 DIAGNOSIS — G4733 Obstructive sleep apnea (adult) (pediatric): Secondary | ICD-10-CM | POA: Diagnosis not present

## 2016-02-27 DIAGNOSIS — E785 Hyperlipidemia, unspecified: Secondary | ICD-10-CM | POA: Diagnosis not present

## 2016-02-27 DIAGNOSIS — N183 Chronic kidney disease, stage 3 (moderate): Secondary | ICD-10-CM | POA: Diagnosis not present

## 2016-02-27 DIAGNOSIS — Z85118 Personal history of other malignant neoplasm of bronchus and lung: Secondary | ICD-10-CM | POA: Diagnosis not present

## 2016-02-27 DIAGNOSIS — Z823 Family history of stroke: Secondary | ICD-10-CM | POA: Diagnosis not present

## 2016-02-27 DIAGNOSIS — Z87891 Personal history of nicotine dependence: Secondary | ICD-10-CM | POA: Diagnosis not present

## 2016-02-27 DIAGNOSIS — N2581 Secondary hyperparathyroidism of renal origin: Secondary | ICD-10-CM | POA: Diagnosis not present

## 2016-02-27 DIAGNOSIS — I252 Old myocardial infarction: Secondary | ICD-10-CM | POA: Diagnosis not present

## 2016-02-27 DIAGNOSIS — Z7982 Long term (current) use of aspirin: Secondary | ICD-10-CM | POA: Diagnosis not present

## 2016-02-27 DIAGNOSIS — R609 Edema, unspecified: Secondary | ICD-10-CM | POA: Diagnosis not present

## 2016-03-02 ENCOUNTER — Encounter: Payer: Self-pay | Admitting: Family Medicine

## 2016-03-04 DIAGNOSIS — Z9981 Dependence on supplemental oxygen: Secondary | ICD-10-CM | POA: Diagnosis not present

## 2016-03-04 DIAGNOSIS — N183 Chronic kidney disease, stage 3 (moderate): Secondary | ICD-10-CM | POA: Diagnosis not present

## 2016-03-04 DIAGNOSIS — Z7982 Long term (current) use of aspirin: Secondary | ICD-10-CM | POA: Diagnosis not present

## 2016-03-04 DIAGNOSIS — N3289 Other specified disorders of bladder: Secondary | ICD-10-CM | POA: Diagnosis not present

## 2016-03-04 DIAGNOSIS — Z923 Personal history of irradiation: Secondary | ICD-10-CM | POA: Diagnosis not present

## 2016-03-04 DIAGNOSIS — E785 Hyperlipidemia, unspecified: Secondary | ICD-10-CM | POA: Diagnosis not present

## 2016-03-04 DIAGNOSIS — C670 Malignant neoplasm of trigone of bladder: Secondary | ICD-10-CM | POA: Diagnosis not present

## 2016-03-04 DIAGNOSIS — Z9221 Personal history of antineoplastic chemotherapy: Secondary | ICD-10-CM | POA: Diagnosis not present

## 2016-03-04 DIAGNOSIS — Z79899 Other long term (current) drug therapy: Secondary | ICD-10-CM | POA: Diagnosis not present

## 2016-03-04 DIAGNOSIS — Z902 Acquired absence of lung [part of]: Secondary | ICD-10-CM | POA: Diagnosis not present

## 2016-03-04 DIAGNOSIS — J309 Allergic rhinitis, unspecified: Secondary | ICD-10-CM | POA: Diagnosis not present

## 2016-03-04 DIAGNOSIS — Z86010 Personal history of colonic polyps: Secondary | ICD-10-CM | POA: Diagnosis not present

## 2016-03-04 DIAGNOSIS — Z8551 Personal history of malignant neoplasm of bladder: Secondary | ICD-10-CM | POA: Diagnosis not present

## 2016-03-04 DIAGNOSIS — I252 Old myocardial infarction: Secondary | ICD-10-CM | POA: Diagnosis not present

## 2016-03-04 DIAGNOSIS — I2510 Atherosclerotic heart disease of native coronary artery without angina pectoris: Secondary | ICD-10-CM | POA: Diagnosis not present

## 2016-03-04 DIAGNOSIS — N2581 Secondary hyperparathyroidism of renal origin: Secondary | ICD-10-CM | POA: Diagnosis not present

## 2016-03-04 DIAGNOSIS — E1122 Type 2 diabetes mellitus with diabetic chronic kidney disease: Secondary | ICD-10-CM | POA: Diagnosis not present

## 2016-03-04 DIAGNOSIS — Z85118 Personal history of other malignant neoplasm of bronchus and lung: Secondary | ICD-10-CM | POA: Diagnosis not present

## 2016-03-04 DIAGNOSIS — Z87442 Personal history of urinary calculi: Secondary | ICD-10-CM | POA: Diagnosis not present

## 2016-03-04 DIAGNOSIS — I129 Hypertensive chronic kidney disease with stage 1 through stage 4 chronic kidney disease, or unspecified chronic kidney disease: Secondary | ICD-10-CM | POA: Diagnosis not present

## 2016-03-04 DIAGNOSIS — J449 Chronic obstructive pulmonary disease, unspecified: Secondary | ICD-10-CM | POA: Diagnosis not present

## 2016-03-04 DIAGNOSIS — G4733 Obstructive sleep apnea (adult) (pediatric): Secondary | ICD-10-CM | POA: Diagnosis not present

## 2016-03-04 DIAGNOSIS — Z9989 Dependence on other enabling machines and devices: Secondary | ICD-10-CM | POA: Diagnosis not present

## 2016-03-13 ENCOUNTER — Other Ambulatory Visit: Payer: Self-pay

## 2016-03-13 NOTE — Patient Outreach (Signed)
[facility]) Care Management  [place]  03/13/2016   Kyle [name] 05/29/1934 [id number]  Subjective: Telephone call to patient for monthly call.  Patient reports he is doing good.  Patient states his A1c was 5.2 on his visit with the doctor earlier this month.  Patient reports he continues to watch his diet.  Encouraged patient to continue diet in order to keep A1c down. He verbalized understanding.  Objective:   Encounter Medications:  Outpatient Encounter Prescriptions as of 03/13/2016  Medication Sig Note  . acetaminophen (TYLENOL) 500 MG tablet Take 500 mg by mouth every 6 (six) hours as needed for mild pain.    Marland Kitchen albuterol (PROAIR HFA) 108 (90 BASE) MCG/ACT inhaler Inhale 2 puffs into the lungs every 6 (six) hours as needed for wheezing.   Marland Kitchen ALPRAZolam (XANAX) 0.5 MG tablet TAKE ONE TABLET TWICE DAILY AS NEEDED FOR ANXIETY   . aspirin EC 81 MG tablet Take 81 mg by mouth daily. Reported on 11/22/2015 11/22/2015: Taken off to put in [place] for 7 days)  . calcitRIOL (ROCALTROL) 0.25 MCG capsule    . cetirizine (ZYRTEC) 10 MG tablet Take 1 tablet (10 mg total) by mouth daily.   . citalopram (CELEXA) 20 MG tablet TAKE ONE (1) TABLET BY MOUTH EVERY DAY   . ferrous sulfate 325 (65 FE) MG tablet Take 325 mg by mouth daily with breakfast.   . fluticasone (FLONASE) 50 MCG/ACT nasal spray Place 2 sprays into both nostrils daily as needed for allergies.   Marland Kitchen lisinopril (PRINIVIL,ZESTRIL) 5 MG tablet TAKE ONE TABLET ONCE DAILY   . metoprolol (LOPRESSOR) 50 MG tablet TAKE ONE TABLET TWICE DAILY   . niacin (NIASPAN) 500 MG CR tablet Take 2 tablets (1,000 mg total) by mouth at bedtime.   . nitroGLYCERIN (NITROSTAT) 0.4 MG SL tablet Place 1 tablet (0.4 mg total) under the tongue every 5 (five) minutes as needed. Call MD if need more than 2   . pravastatin (PRAVACHOL) 80 MG tablet TAKE ONE (1) TABLET EACH DAY   . SPIRIVA HANDIHALER 18 MCG inhalation capsule INHALE 1 DOSE BY  MOUTH ONCE DAILY   . ipratropium (ATROVENT) 0.06 % nasal spray Place 2 sprays into both nostrils 4 (four) times daily. (Patient not taking: Reported on 02/22/2016)    No facility-administered encounter medications on file as of 03/13/2016.     Functional Status:  In your present state of health, do you have any difficulty performing the following activities: 10/26/2015  Hearing? Y  Vision? Y  Difficulty concentrating or making decisions? Y  Walking or climbing stairs? Y  Dressing or bathing? N  Doing errands, shopping? N  Preparing Food and eating ? N  Using the Toilet? N  In the past six months, have you accidently leaked urine? N  Do you have problems with loss of bowel control? N  Managing your Medications? N  Managing your Finances? N  Housekeeping or managing your Housekeeping? N  Some recent data might be hidden    Fall/Depression Screening: PHQ 2/9 Scores 03/13/2016 02/15/2016 12/07/2015 10/26/2015 08/30/2015 04/30/2015 03/05/2015  PHQ - 2 Score 0 0 0 0 0 0 0    Assessment: Patient continues to benefit from health coach outreach for disease management and support.    Plan:  [facility] CM Care Plan Problem One   Flowsheet Row Most Recent Value  Care Plan Problem One  Diabetes Disease Management needs  Role Documenting the Problem One  Health Coach  Care  Plan for Problem One  Active  THN Long Term Goal (31-90 days)  Patient will keep A1c less than 6.0 within 90 days.   THN Long Term Goal Start Date  03/13/16 [goal continued]  Interventions for Problem One Long Term Goal  RN Health Coach reinforced importance of diet in kepping A1c low.   THN CM Short Term Goal #3 (0-30 days)  Patient will continue to work to improve dietary balance by watching carbohydrates and sugars within the next 30 days.  THN CM Short Term Goal #3 Start Date  02/15/16  [facility] CM Short Term Goal #3 Met Date  03/13/16  Interventions for Short Tern Goal #3  Patient continues to watch diet.       RN Health Coach will  contact patient in the month of September and patient agrees to next outreach.  [name], RN, MSN [place] [phone]

## 2016-03-17 ENCOUNTER — Ambulatory Visit (INDEPENDENT_AMBULATORY_CARE_PROVIDER_SITE_OTHER): Payer: Medicare Other | Admitting: Otolaryngology

## 2016-03-17 DIAGNOSIS — H903 Sensorineural hearing loss, bilateral: Secondary | ICD-10-CM

## 2016-03-17 DIAGNOSIS — H6123 Impacted cerumen, bilateral: Secondary | ICD-10-CM | POA: Diagnosis not present

## 2016-03-19 DIAGNOSIS — J449 Chronic obstructive pulmonary disease, unspecified: Secondary | ICD-10-CM | POA: Diagnosis not present

## 2016-03-19 DIAGNOSIS — H2513 Age-related nuclear cataract, bilateral: Secondary | ICD-10-CM | POA: Diagnosis not present

## 2016-03-19 DIAGNOSIS — E119 Type 2 diabetes mellitus without complications: Secondary | ICD-10-CM | POA: Diagnosis not present

## 2016-03-20 ENCOUNTER — Encounter: Payer: Self-pay | Admitting: Pulmonary Disease

## 2016-03-20 ENCOUNTER — Ambulatory Visit (INDEPENDENT_AMBULATORY_CARE_PROVIDER_SITE_OTHER): Payer: Medicare Other | Admitting: Pulmonary Disease

## 2016-03-20 DIAGNOSIS — Z720 Tobacco use: Secondary | ICD-10-CM | POA: Diagnosis not present

## 2016-03-20 DIAGNOSIS — J438 Other emphysema: Secondary | ICD-10-CM

## 2016-03-20 DIAGNOSIS — C3491 Malignant neoplasm of unspecified part of right bronchus or lung: Secondary | ICD-10-CM | POA: Diagnosis not present

## 2016-03-20 DIAGNOSIS — J3089 Other allergic rhinitis: Secondary | ICD-10-CM

## 2016-03-20 NOTE — Assessment & Plan Note (Signed)
Use Neil Med rinses with distilled water at least twice per day using the instructions on the package. 1/2 hour after using the [facility] Med rinse, use Nasacort two puffs in each nostril once per day.  Remember that the Nasacort can take 1-2 weeks to work after regular use. Use generic zyrtec (cetirizine) every day.  If this doesn't help, then stop taking it and use chlorpheniramine-phenylephrine combination tablets.

## 2016-03-20 NOTE — Assessment & Plan Note (Signed)
Chest x-ray images personally reviewed, recent CT chest images personally reviewed, see description above  No evidence of recurrence, continue follow-up with oncology

## 2016-03-20 NOTE — Progress Notes (Signed)
Subjective:    Patient ID: Kyle [name], male    DOB: October 20, 1933, 82 y.o.   MRN: [id number]  Synopsis:  Former patient of Dr. Gwenette Greet with COPD PFT's 02/2011:  FEV1 2.40 (83%), ratio 60, nl TLC, DLCO 59%; treated with nocturnal oxygen. +response to spiriva alone >> not frequent exacerbator Trial of stiolto 2015 >> no change in breathing.  Participated in pulmonary rehab, then quit going.   Kyle [name] also had Lung cancer (Stage IA adeno, resected 2012) and urethral cancer AND Bladder cancer.    HPI Chief Complaint  Patient presents with  . Follow-up    pt c/o runny nose, PND, prod cough with clear mucus X2-3 days.     Jacon says that he has been having some sinus drainage lately which he thinks is related to the ragweed.  He has been breathing well, no significant wheezing or change.  The breathing is about the same as last time.  He has some shortness of breath with exertion which is better with rest.  He does not exert himself at all, his wife says he drives a golf cart wherever he goes.  He says that the post nasal drip has been pretty bad.   Past Medical History:  Diagnosis Date  . AAA (abdominal aortic aneurysm) ([employer]) 2004   s/p repair 2004; 4.3 cm infrarenal in 05/2011  . Abnormality of gait 02/23/2013  . Adenocarcinoma of right lung ([place]) 02/24/2011   Ct A/P 2012:  2cm lung mass RLL PET 0263:  Hypermetabolic RLL mass, no other hypermetabolic areas. TTNA 02/2011:  Adenocarcinoma, markers c/w lung origin Right lower lobe superior segmentectomy. 04/01/2011 Dr. Arlyce Dice   . Adenocarcinoma, lung ([place]) 01/2011   transthoracic FNA; resection of the superior segment of the RLL in 03/2011; negative nodes; no chemotherapy nor radiation planned  . Arm fracture    right arm  . Arteriosclerotic cardiovascular disease (ASCVD) 1973, 12/2010   S/P NSTEMI secondary to distal RCA/PL lesion, tx medically.  EF of  55%-60% per  echo.  . Benign prostatic hypertrophy    s/p transurethral resection of the  prostate  . Bilateral renal masses    Cystic, more prominent on CT in 12/2010 than 2007; followed by Dr. Rosana Hoes  . Bladder cancer [facility]) 1996   Transurethral resection of the bladder + chemotherapy/BCG as premed  . Cataract   . Chronic kidney disease    Creatinine 1.4 on discharge 12/20/2100; proteinuria; normal renal ultrasound in 2010; recent creatinines of 1.7-2.; Bilateral cystic renal masses by CT in 2011  . COPD (chronic obstructive pulmonary disease) ([place])   . Coronary artery disease   . Cough    thick phlegm  . Diabetes mellitus    Type II  . Diplopia 02/23/2013  . Diverticulosis   . Essential and other specified forms of tremor 02/23/2013  . Hx of Clostridium difficile infection   . Hyperlipidemia   . Hypertension   . Insomnia   . ITP (idiopathic thrombocytopenic purpura) 09/07/2012   Chronic ITP of adults versus medication-induced ITP.  Stable  . Myocardial infarction ([place])   . Nephrolithiasis 2012   ARF in 01/2011 due to obstructing nephrolithiasis  . Obesity   . OSA (obstructive sleep apnea)   . Polyneuropathy in diabetes(357.2) 02/23/2013  . Thrombocytopenia ([place])   . Tobacco abuse    50-pack-year consumption; quit in 12/2010  . Tubular adenoma of colon   . Ventral hernia       Review of Systems  Constitutional:  Negative for chills, fatigue and fever.  HENT: Positive for postnasal drip, rhinorrhea and sinus pressure.   Respiratory: Positive for cough. Negative for shortness of breath and wheezing.   Cardiovascular: Negative for chest pain and leg swelling.       Objective:   Physical Exam Vitals:   03/20/16 0959  BP: 122/66  Pulse: (!) 58  SpO2: 92%  Weight: 268 lb 3.2 oz (121.7 kg)  Height: 6' (1.829 m)   Gen: chronically ill appearing HENT: OP clear,  neck supple PULM: wheezing bilaterally, normal air movement CV: RRR, no mgr, trace edema GI: BS+, soft, nontender Derm: no cyanosis or rash Psyche: normal mood and affect   Records from Oncology reviewed  recently, continuing surveillance  CBC    Component Value Date/Time   WBC 7.5 12/25/2014 0858   RBC 4.87 12/25/2014 0858   HGB 15.4 12/25/2014 0858   HCT 46.9 12/25/2014 0858   PLT 118 (L) 12/25/2014 0858   MCV 96.3 12/25/2014 0858   MCH 31.6 12/25/2014 0858   MCHC 32.8 12/25/2014 0858   RDW 15.5 12/25/2014 0858   LYMPHSABS 2.2 12/25/2014 0858   MONOABS 0.5 12/25/2014 0858   EOSABS 0.2 12/25/2014 0858   BASOSABS 0.0 12/25/2014 0858   June 2017 CT chest images personally reviewed showing some centrilobular emphysema worse in the upper lobes, mild posttreatment changes with volume loss in the right lung, mild scarring in the bilateral lobes not consistent with usual interstitial pneumonitis      Assessment & Plan:  COPD (chronic obstructive pulmonary disease) This has been a stable interval for Kyle [name]. He has not had an exacerbation since last visit.  Plan: Continue Spiriva Flu shot with primary care physician Exercise encouraged  Adenocarcinoma of right lung Chest x-ray images personally reviewed, recent CT chest images personally reviewed, see description above  No evidence of recurrence, continue follow-up with oncology  Tobacco abuse Continue to stay way from cigarettes.  Allergic rhinitis Use Neil Med rinses with distilled water at least twice per day using the instructions on the package. 1/2 hour after using the [facility] Med rinse, use Nasacort two puffs in each nostril once per day.  Remember that the Nasacort can take 1-2 weeks to work after regular use. Use generic zyrtec (cetirizine) every day.  If this doesn't help, then stop taking it and use chlorpheniramine-phenylephrine combination tablets.      Current Outpatient Prescriptions:  .  acetaminophen (TYLENOL) 500 MG tablet, Take 500 mg by mouth every 6 (six) hours as needed for mild pain. , Disp: , Rfl:  .  albuterol (PROAIR HFA) 108 (90 BASE) MCG/ACT inhaler, Inhale 2 puffs into the lungs every 6 (six) hours as  needed for wheezing., Disp: 1 Inhaler, Rfl: 5 .  ALPRAZolam (XANAX) 0.5 MG tablet, TAKE ONE TABLET TWICE DAILY AS NEEDED FOR ANXIETY, Disp: 60 tablet, Rfl: 5 .  aspirin EC 81 MG tablet, Take 81 mg by mouth daily. Reported on 11/22/2015, Disp: , Rfl:  .  calcitRIOL (ROCALTROL) 0.25 MCG capsule, , Disp: , Rfl:  .  cetirizine (ZYRTEC) 10 MG tablet, Take 1 tablet (10 mg total) by mouth daily., Disp: 30 tablet, Rfl: 5 .  citalopram (CELEXA) 20 MG tablet, TAKE ONE (1) TABLET BY MOUTH EVERY DAY, Disp: 90 tablet, Rfl: 0 .  ferrous sulfate 325 (65 FE) MG tablet, Take 325 mg by mouth daily with breakfast., Disp: , Rfl:  .  fluticasone (FLONASE) 50 MCG/ACT nasal spray, Place 2 sprays into both nostrils  daily as needed for allergies., Disp: 16 g, Rfl: 5 .  ipratropium (ATROVENT) 0.06 % nasal spray, Place 2 sprays into both nostrils 4 (four) times daily., Disp: 15 mL, Rfl: 5 .  lisinopril (PRINIVIL,ZESTRIL) 5 MG tablet, TAKE ONE TABLET ONCE DAILY, Disp: 90 tablet, Rfl: 0 .  metoprolol (LOPRESSOR) 50 MG tablet, TAKE ONE TABLET TWICE DAILY, Disp: 180 tablet, Rfl: 1 .  niacin (NIASPAN) 500 MG CR tablet, Take 2 tablets (1,000 mg total) by mouth at bedtime., Disp: 60 tablet, Rfl: 5 .  nitroGLYCERIN (NITROSTAT) 0.4 MG SL tablet, Place 1 tablet (0.4 mg total) under the tongue every 5 (five) minutes as needed. Call MD if need more than 2, Disp: 3 tablet, Rfl: 0 .  pravastatin (PRAVACHOL) 80 MG tablet, TAKE ONE (1) TABLET EACH DAY, Disp: 90 tablet, Rfl: 1 .  SPIRIVA HANDIHALER 18 MCG inhalation capsule, INHALE 1 DOSE BY MOUTH ONCE DAILY, Disp: 90 capsule, Rfl: 1

## 2016-03-20 NOTE — Patient Instructions (Signed)
For the allergic rhinitis I recommend the following: Use Neil Med rinses with distilled water at least twice per day using the instructions on the package. 1/2 hour after using the [facility] Med rinse, use Nasacort two puffs in each nostril once per day.  Remember that the Nasacort can take 1-2 weeks to work after regular use. Use generic zyrtec (cetirizine) every day.  If this doesn't help, then stop taking it and use chlorpheniramine-phenylephrine combination tablets.  Keep using Spiriva as you are doing Get a flu shot with her primary care doctor I'll see you back in 6 months

## 2016-03-20 NOTE — Assessment & Plan Note (Signed)
This has been a stable interval for him. He has not had an exacerbation since last visit.  Plan: Continue Spiriva Flu shot with primary care physician Exercise encouraged

## 2016-03-20 NOTE — Assessment & Plan Note (Signed)
Continue to stay way from cigarettes.

## 2016-03-21 ENCOUNTER — Telehealth: Payer: Self-pay | Admitting: *Deleted

## 2016-03-21 ENCOUNTER — Emergency Department (HOSPITAL_COMMUNITY): Payer: Medicare Other

## 2016-03-21 ENCOUNTER — Encounter (HOSPITAL_COMMUNITY): Payer: Self-pay | Admitting: *Deleted

## 2016-03-21 ENCOUNTER — Inpatient Hospital Stay (HOSPITAL_COMMUNITY)
Admission: EM | Admit: 2016-03-21 | Discharge: 2016-03-24 | DRG: 086 | Disposition: A | Discharge status: Skilled Nursing Facility | Payer: Medicare Other | Attending: Internal Medicine | Admitting: Internal Medicine

## 2016-03-21 DIAGNOSIS — R55 Syncope and collapse: Secondary | ICD-10-CM | POA: Diagnosis not present

## 2016-03-21 DIAGNOSIS — I6200 Nontraumatic subdural hemorrhage, unspecified: Secondary | ICD-10-CM | POA: Diagnosis not present

## 2016-03-21 DIAGNOSIS — G4733 Obstructive sleep apnea (adult) (pediatric): Secondary | ICD-10-CM | POA: Diagnosis present

## 2016-03-21 DIAGNOSIS — S065XAA Traumatic subdural hemorrhage with loss of consciousness status unknown, initial encounter: Secondary | ICD-10-CM | POA: Diagnosis present

## 2016-03-21 DIAGNOSIS — W1830XA Fall on same level, unspecified, initial encounter: Secondary | ICD-10-CM | POA: Diagnosis not present

## 2016-03-21 DIAGNOSIS — I951 Orthostatic hypotension: Secondary | ICD-10-CM | POA: Diagnosis present

## 2016-03-21 DIAGNOSIS — S065X9A Traumatic subdural hemorrhage with loss of consciousness of unspecified duration, initial encounter: Secondary | ICD-10-CM | POA: Diagnosis present

## 2016-03-21 DIAGNOSIS — I714 Abdominal aortic aneurysm, without rupture: Secondary | ICD-10-CM | POA: Diagnosis not present

## 2016-03-21 DIAGNOSIS — N183 Chronic kidney disease, stage 3 unspecified: Secondary | ICD-10-CM | POA: Diagnosis present

## 2016-03-21 DIAGNOSIS — S065X0A Traumatic subdural hemorrhage without loss of consciousness, initial encounter: Principal | ICD-10-CM | POA: Diagnosis present

## 2016-03-21 DIAGNOSIS — Z7982 Long term (current) use of aspirin: Secondary | ICD-10-CM | POA: Diagnosis not present

## 2016-03-21 DIAGNOSIS — Z85118 Personal history of other malignant neoplasm of bronchus and lung: Secondary | ICD-10-CM | POA: Diagnosis not present

## 2016-03-21 DIAGNOSIS — J449 Chronic obstructive pulmonary disease, unspecified: Secondary | ICD-10-CM | POA: Diagnosis present

## 2016-03-21 DIAGNOSIS — E1142 Type 2 diabetes mellitus with diabetic polyneuropathy: Secondary | ICD-10-CM | POA: Diagnosis not present

## 2016-03-21 DIAGNOSIS — S0081XA Abrasion of other part of head, initial encounter: Secondary | ICD-10-CM | POA: Diagnosis present

## 2016-03-21 DIAGNOSIS — E1122 Type 2 diabetes mellitus with diabetic chronic kidney disease: Secondary | ICD-10-CM | POA: Diagnosis not present

## 2016-03-21 DIAGNOSIS — Z79899 Other long term (current) drug therapy: Secondary | ICD-10-CM

## 2016-03-21 DIAGNOSIS — R1312 Dysphagia, oropharyngeal phase: Secondary | ICD-10-CM | POA: Diagnosis not present

## 2016-03-21 DIAGNOSIS — I129 Hypertensive chronic kidney disease with stage 1 through stage 4 chronic kidney disease, or unspecified chronic kidney disease: Secondary | ICD-10-CM | POA: Diagnosis present

## 2016-03-21 DIAGNOSIS — R197 Diarrhea, unspecified: Secondary | ICD-10-CM | POA: Diagnosis not present

## 2016-03-21 DIAGNOSIS — Z8551 Personal history of malignant neoplasm of bladder: Secondary | ICD-10-CM

## 2016-03-21 DIAGNOSIS — R262 Difficulty in walking, not elsewhere classified: Secondary | ICD-10-CM | POA: Diagnosis not present

## 2016-03-21 DIAGNOSIS — Z23 Encounter for immunization: Secondary | ICD-10-CM | POA: Diagnosis not present

## 2016-03-21 DIAGNOSIS — R488 Other symbolic dysfunctions: Secondary | ICD-10-CM | POA: Diagnosis not present

## 2016-03-21 DIAGNOSIS — W19XXXA Unspecified fall, initial encounter: Secondary | ICD-10-CM

## 2016-03-21 DIAGNOSIS — D693 Immune thrombocytopenic purpura: Secondary | ICD-10-CM | POA: Diagnosis present

## 2016-03-21 DIAGNOSIS — N200 Calculus of kidney: Secondary | ICD-10-CM | POA: Diagnosis not present

## 2016-03-21 DIAGNOSIS — S6992XA Unspecified injury of left wrist, hand and finger(s), initial encounter: Secondary | ICD-10-CM | POA: Diagnosis not present

## 2016-03-21 DIAGNOSIS — S01511A Laceration without foreign body of lip, initial encounter: Secondary | ICD-10-CM | POA: Diagnosis not present

## 2016-03-21 DIAGNOSIS — S61412A Laceration without foreign body of left hand, initial encounter: Secondary | ICD-10-CM | POA: Diagnosis not present

## 2016-03-21 DIAGNOSIS — M6281 Muscle weakness (generalized): Secondary | ICD-10-CM | POA: Diagnosis not present

## 2016-03-21 DIAGNOSIS — S199XXA Unspecified injury of neck, initial encounter: Secondary | ICD-10-CM | POA: Diagnosis not present

## 2016-03-21 DIAGNOSIS — Z87891 Personal history of nicotine dependence: Secondary | ICD-10-CM

## 2016-03-21 DIAGNOSIS — S022XXD Fracture of nasal bones, subsequent encounter for fracture with routine healing: Secondary | ICD-10-CM | POA: Diagnosis not present

## 2016-03-21 DIAGNOSIS — E119 Type 2 diabetes mellitus without complications: Secondary | ICD-10-CM | POA: Diagnosis not present

## 2016-03-21 DIAGNOSIS — R279 Unspecified lack of coordination: Secondary | ICD-10-CM | POA: Diagnosis not present

## 2016-03-21 DIAGNOSIS — S022XXA Fracture of nasal bones, initial encounter for closed fracture: Secondary | ICD-10-CM | POA: Diagnosis present

## 2016-03-21 DIAGNOSIS — I10 Essential (primary) hypertension: Secondary | ICD-10-CM | POA: Diagnosis not present

## 2016-03-21 DIAGNOSIS — S065X0D Traumatic subdural hemorrhage without loss of consciousness, subsequent encounter: Secondary | ICD-10-CM | POA: Diagnosis not present

## 2016-03-21 DIAGNOSIS — E785 Hyperlipidemia, unspecified: Secondary | ICD-10-CM | POA: Diagnosis present

## 2016-03-21 DIAGNOSIS — K439 Ventral hernia without obstruction or gangrene: Secondary | ICD-10-CM | POA: Diagnosis not present

## 2016-03-21 DIAGNOSIS — I252 Old myocardial infarction: Secondary | ICD-10-CM

## 2016-03-21 DIAGNOSIS — I2510 Atherosclerotic heart disease of native coronary artery without angina pectoris: Secondary | ICD-10-CM | POA: Diagnosis not present

## 2016-03-21 DIAGNOSIS — E0821 Diabetes mellitus due to underlying condition with diabetic nephropathy: Secondary | ICD-10-CM | POA: Diagnosis not present

## 2016-03-21 DIAGNOSIS — Z9181 History of falling: Secondary | ICD-10-CM

## 2016-03-21 DIAGNOSIS — M79642 Pain in left hand: Secondary | ICD-10-CM | POA: Diagnosis not present

## 2016-03-21 DIAGNOSIS — T148XXA Other injury of unspecified body region, initial encounter: Secondary | ICD-10-CM

## 2016-03-21 LAB — COMPREHENSIVE METABOLIC PANEL
ALK PHOS: 50 U/L (ref 38–126)
ALT: 12 U/L — AB (ref 17–63)
ANION GAP: 8 (ref 5–15)
AST: 16 U/L (ref 15–41)
Albumin: 3.8 g/dL (ref 3.5–5.0)
BILIRUBIN TOTAL: 1.4 mg/dL — AB (ref 0.3–1.2)
BUN: 27 mg/dL — ABNORMAL HIGH (ref 6–20)
CALCIUM: 9.5 mg/dL (ref 8.9–10.3)
CO2: 27 mmol/L (ref 22–32)
CREATININE: 1.89 mg/dL — AB (ref 0.61–1.24)
Chloride: 106 mmol/L (ref 101–111)
GFR, EST AFRICAN AMERICAN: 37 mL/min — AB (ref >60)
GFR, EST NON AFRICAN AMERICAN: 32 mL/min — AB (ref >60)
Glucose, Bld: 124 mg/dL — ABNORMAL HIGH (ref 65–99)
Potassium: 4.4 mmol/L (ref 3.5–5.1)
Sodium: 141 mmol/L (ref 135–145)
TOTAL PROTEIN: 6.7 g/dL (ref 6.5–8.1)

## 2016-03-21 LAB — CBC WITH DIFFERENTIAL/PLATELET
Basophils Absolute: 0 10*3/uL (ref 0.0–0.1)
Basophils Relative: 0 %
EOS PCT: 2 %
Eosinophils Absolute: 0.2 10*3/uL (ref 0.0–0.7)
HCT: 41.2 % (ref 39.0–52.0)
HEMOGLOBIN: 13.6 g/dL (ref 13.0–17.0)
LYMPHS ABS: 1.1 10*3/uL (ref 0.7–4.0)
LYMPHS PCT: 17 %
MCH: 32.9 pg (ref 26.0–34.0)
MCHC: 33 g/dL (ref 30.0–36.0)
MCV: 99.5 fL (ref 78.0–100.0)
MONOS PCT: 8 %
Monocytes Absolute: 0.6 10*3/uL (ref 0.1–1.0)
NEUTROS PCT: 72 %
Neutro Abs: 4.8 10*3/uL (ref 1.7–7.7)
Platelets: 81 10*3/uL — ABNORMAL LOW (ref 150–400)
RBC: 4.14 MIL/uL — AB (ref 4.22–5.81)
RDW: 15.6 % — ABNORMAL HIGH (ref 11.5–15.5)
WBC: 6.6 10*3/uL (ref 4.0–10.5)

## 2016-03-21 LAB — PROTIME-INR
INR: 1.07
PROTHROMBIN TIME: 13.9 s (ref 11.4–15.2)

## 2016-03-21 LAB — GLUCOSE, CAPILLARY: GLUCOSE-CAPILLARY: 159 mg/dL — AB (ref 65–99)

## 2016-03-21 LAB — MRSA PCR SCREENING: MRSA BY PCR: NEGATIVE

## 2016-03-21 MED ORDER — PRAVASTATIN SODIUM 40 MG PO TABS
80.0000 mg | ORAL_TABLET | Freq: Every day | ORAL | Status: DC
  Administered 2016-03-22 – 2016-03-24 (×3): 80 mg via ORAL
  Filled 2016-03-21 (×4): qty 2

## 2016-03-21 MED ORDER — DEXTROSE-NACL 5-0.9 % IV SOLN
INTRAVENOUS | Status: DC
  Administered 2016-03-21: 22:00:00 via INTRAVENOUS

## 2016-03-21 MED ORDER — CITALOPRAM HYDROBROMIDE 20 MG PO TABS
20.0000 mg | ORAL_TABLET | Freq: Every day | ORAL | Status: DC
  Administered 2016-03-22 – 2016-03-24 (×3): 20 mg via ORAL
  Filled 2016-03-21 (×3): qty 1

## 2016-03-21 MED ORDER — INSULIN ASPART 100 UNIT/ML ~~LOC~~ SOLN
0.0000 [IU] | SUBCUTANEOUS | Status: DC
  Administered 2016-03-22: 2 [IU] via SUBCUTANEOUS
  Administered 2016-03-22: 3 [IU] via SUBCUTANEOUS

## 2016-03-21 MED ORDER — LISINOPRIL 5 MG PO TABS
5.0000 mg | ORAL_TABLET | Freq: Every day | ORAL | Status: DC
  Administered 2016-03-22: 5 mg via ORAL
  Filled 2016-03-21: qty 1

## 2016-03-21 MED ORDER — LORATADINE 10 MG PO TABS
10.0000 mg | ORAL_TABLET | Freq: Every day | ORAL | Status: DC
  Administered 2016-03-22 – 2016-03-24 (×3): 10 mg via ORAL
  Filled 2016-03-21 (×3): qty 1

## 2016-03-21 MED ORDER — ALPRAZOLAM 0.5 MG PO TABS
0.5000 mg | ORAL_TABLET | Freq: Two times a day (BID) | ORAL | Status: DC | PRN
  Administered 2016-03-22 – 2016-03-23 (×2): 0.5 mg via ORAL
  Filled 2016-03-21 (×2): qty 1

## 2016-03-21 MED ORDER — ALBUTEROL SULFATE (2.5 MG/3ML) 0.083% IN NEBU: 3.0000 mL | INHALATION_SOLUTION | RESPIRATORY_TRACT | Status: DC | PRN

## 2016-03-21 MED ORDER — ONDANSETRON HCL 4 MG/2ML IJ SOLN
4.0000 mg | Freq: Four times a day (QID) | INTRAMUSCULAR | Status: DC | PRN
Start: 2016-03-21 — End: 2016-03-24
  Administered 2016-03-21: 4 mg via INTRAVENOUS
  Filled 2016-03-21: qty 2

## 2016-03-21 MED ORDER — TETANUS-DIPHTH-ACELL PERTUSSIS 5-2.5-18.5 LF-MCG/0.5 IM SUSP
0.5000 mL | Freq: Once | INTRAMUSCULAR | Status: AC
  Administered 2016-03-21: 0.5 mL via INTRAMUSCULAR
  Filled 2016-03-21: qty 0.5

## 2016-03-21 MED ORDER — METOPROLOL TARTRATE 50 MG PO TABS: 50.0000 mg | ORAL_TABLET | Freq: Two times a day (BID) | ORAL | Status: DC

## 2016-03-21 MED ORDER — ALBUTEROL SULFATE (2.5 MG/3ML) 0.083% IN NEBU: 3.0000 mL | INHALATION_SOLUTION | Freq: Four times a day (QID) | RESPIRATORY_TRACT | Status: DC | PRN

## 2016-03-21 MED ORDER — LIDOCAINE-EPINEPHRINE (PF) 1 %-1:200000 IJ SOLN
INTRAMUSCULAR | Status: AC
  Filled 2016-03-21: qty 30

## 2016-03-21 MED ORDER — PROCHLORPERAZINE EDISYLATE 5 MG/ML IJ SOLN
10.0000 mg | INTRAMUSCULAR | Status: DC | PRN
  Filled 2016-03-21: qty 2

## 2016-03-21 MED ORDER — FENTANYL CITRATE (PF) 100 MCG/2ML IJ SOLN
25.0000 ug | INTRAMUSCULAR | Status: DC | PRN
  Administered 2016-03-21 – 2016-03-22 (×4): 25 ug via INTRAVENOUS
  Filled 2016-03-21 (×4): qty 2

## 2016-03-21 MED ORDER — ONDANSETRON HCL 4 MG/2ML IJ SOLN
4.0000 mg | Freq: Once | INTRAMUSCULAR | Status: DC
  Filled 2016-03-21: qty 2

## 2016-03-21 MED ORDER — LIDOCAINE-EPINEPHRINE 1 %-1:100000 IJ SOLN: 10.0000 mL | Freq: Once | INTRAMUSCULAR | Status: DC

## 2016-03-21 MED ORDER — TIOTROPIUM BROMIDE MONOHYDRATE 18 MCG IN CAPS
18.0000 ug | ORAL_CAPSULE | Freq: Every day | RESPIRATORY_TRACT | Status: DC
  Administered 2016-03-24: 18 ug via RESPIRATORY_TRACT
  Filled 2016-03-21: qty 5

## 2016-03-21 MED ORDER — ACETAMINOPHEN 325 MG PO TABS
650.0000 mg | ORAL_TABLET | Freq: Once | ORAL | Status: AC
  Administered 2016-03-21: 650 mg via ORAL
  Filled 2016-03-21: qty 2

## 2016-03-21 MED ORDER — METOPROLOL TARTRATE 5 MG/5ML IV SOLN
2.5000 mg | Freq: Four times a day (QID) | INTRAVENOUS | Status: DC
  Administered 2016-03-21 – 2016-03-22 (×2): 2.5 mg via INTRAVENOUS
  Filled 2016-03-21 (×2): qty 5

## 2016-03-21 MED ORDER — ACETAMINOPHEN 500 MG PO TABS
500.0000 mg | ORAL_TABLET | Freq: Four times a day (QID) | ORAL | Status: DC | PRN
  Administered 2016-03-22 – 2016-03-24 (×5): 500 mg via ORAL
  Filled 2016-03-21 (×4): qty 1

## 2016-03-21 NOTE — ED Notes (Signed)
Report to [facility] RN with [facility].

## 2016-03-21 NOTE — ED Triage Notes (Signed)
States he bent over and lost his balance and landed on cement driveway, abrasions to left side of face

## 2016-03-21 NOTE — ED Notes (Signed)
Patient's face and left hand cleansed of blood. Patient with noted nasal swelling and bruising, left eye swelling, left sided upper lip laceration, left maxillary abrasions, left hand lacerations. Teeth appear intact from visual exam, no noted chips or breaks.

## 2016-03-21 NOTE — Telephone Encounter (Signed)
Patient's son called and stated that patient took a fall this am and hit his head. Son([name]) states his father hs some cuts on his face one under his eye and was a bit confused when he first arrived. Consult with Dr Richardson Landry. Dr Richardson Landry advises patient go directly to ER for evaluation and treatment. Discussed recommendations with son who verbalized understanding and stated he would take him to the ER.

## 2016-03-21 NOTE — ED Provider Notes (Signed)
[facility] DEPT Provider Note   CSN: [id number] Arrival date & time: 03/21/16  0998  By signing my name below, I, Rayna Sexton, attest that this documentation has been prepared under the direction and in the presence of Fredia Sorrow, MD. Electronically Signed: Rayna Sexton, ED Scribe. 03/21/16. 9:51 AM.   History   Chief Complaint Chief Complaint  Patient presents with  . Fall    HPI HPI Comments: ALDRICH LLOYD is a 82 y.o. male who presents to the Emergency Department complaining of a fall that occurred this morning. Pt states that he bent over and lost his balance, falling forward and landing on a cement driveway. He reports associated abrasions and cuts to his left face and left hand with no active bleeding as well as diffuse back pain which worsens with movement. His relative states he has experienced falls in the past and he was initially disoriented following the fall but confirms he is currently acting baseline. He states that he has been experiencing symptoms similar to a sinus infection for the past few days and his relative notes he experienced 1x loose bowels yesterday while at a restaurant but states this is typical when he eats at this particular restaurant. His family is unsure if he is UTD on his tetanus vaccination. Pt has chronic double vision and LE swelling which he denies has acutely worsened. He takes Asprin daily but is not on any blood thinning medications. Pt and his relatives denies he has experienced LOC.   The history is provided by the patient and a relative. No language interpreter was used.  Fall  This is a recurrent problem. The current episode started 1 to 2 hours ago. The problem occurs rarely. The problem has not changed since onset.Associated symptoms include headaches. Pertinent negatives include no chest pain, no abdominal pain and no shortness of breath. He has tried nothing for the symptoms. The treatment provided no relief.    Past Medical  History:  Diagnosis Date  . AAA (abdominal aortic aneurysm) ([place]) 2004   s/p repair 2004; 4.3 cm infrarenal in 05/2011  . Abnormality of gait 02/23/2013  . Adenocarcinoma of right lung ([place]) 02/24/2011   Ct A/P 2012:  2cm lung mass RLL PET 3382:  Hypermetabolic RLL mass, no other hypermetabolic areas. TTNA 02/2011:  Adenocarcinoma, markers c/w lung origin Right lower lobe superior segmentectomy. 04/01/2011 Dr. Arlyce Dice   . Adenocarcinoma, lung ([place]) 01/2011   transthoracic FNA; resection of the superior segment of the RLL in 03/2011; negative nodes; no chemotherapy nor radiation planned  . Arm fracture    right arm  . Arteriosclerotic cardiovascular disease (ASCVD) 1973, 12/2010   S/P NSTEMI secondary to distal RCA/PL lesion, tx medically.  EF of  55%-60% per  echo.  . Benign prostatic hypertrophy    s/p transurethral resection of the prostate  . Bilateral renal masses    Cystic, more prominent on CT in 12/2010 than 2007; followed by Dr. Rosana Hoes  . Bladder cancer [facility]) 1996   Transurethral resection of the bladder + chemotherapy/BCG as premed  . Cataract   . Chronic kidney disease    Creatinine 1.4 on discharge 12/20/2100; proteinuria; normal renal ultrasound in 2010; recent creatinines of 1.7-2.; Bilateral cystic renal masses by CT in 2011  . COPD (chronic obstructive pulmonary disease) ([place])   . Coronary artery disease   . Cough    thick phlegm  . Diabetes mellitus    Type II  . Diplopia 02/23/2013  . Diverticulosis   .  Essential and other specified forms of tremor 02/23/2013  . Hx of Clostridium difficile infection   . Hyperlipidemia   . Hypertension   . Insomnia   . ITP (idiopathic thrombocytopenic purpura) 09/07/2012   Chronic ITP of adults versus medication-induced ITP.  Stable  . Myocardial infarction ([place])   . Nephrolithiasis 2012   ARF in 01/2011 due to obstructing nephrolithiasis  . Obesity   . OSA (obstructive sleep apnea)   . Polyneuropathy in diabetes(357.2) 02/23/2013  .  Thrombocytopenia ([place])   . Tobacco abuse    50-pack-year consumption; quit in 12/2010  . Tubular adenoma of colon   . Ventral hernia     Patient Active Problem List   Diagnosis Date Noted  . Cough 10/03/2015  . Allergic rhinitis 10/03/2015  . Tremor, essential 08/27/2015  . History of colonic polyps   . Diverticulosis of colon without hemorrhage   . Essential and other specified forms of tremor 02/23/2013  . Diplopia 02/23/2013  . Diabetic polyneuropathy ([place]) 02/23/2013  . Abnormality of gait 02/23/2013  . COPD exacerbation ([place]) 01/31/2013  . Fracture of right distal radius 12/14/2012  . Diabetes ([place]) 11/30/2012  . ITP (idiopathic thrombocytopenic purpura) 09/07/2012  . LLQ pain 11/13/2011  . Ventral hernia 10/28/2011  . AAA (abdominal aortic aneurysm) ([place])   . Tobacco abuse   . Diarrhea 10/22/2011  . Tubular adenoma of colon   . Obstructive sleep apnea 03/27/2011  . Fasting hyperglycemia 03/27/2011  . Nephrolithiasis 03/27/2011  . Bladder carcinoma ([place]) 03/27/2011  . COPD (chronic obstructive pulmonary disease) ([place]) 02/24/2011  . Adenocarcinoma of right lung ([place]) 02/24/2011  . Arteriosclerotic cardiovascular disease (ASCVD) 01/06/2011  . Hypertension 01/06/2011  . CKD (chronic kidney disease) stage 3, GFR 30-59 ml/min 01/06/2011    Past Surgical History:  Procedure Laterality Date  . ABDOMINAL AORTIC ANEURYSM REPAIR  2004  . CARDIAC CATHETERIZATION    . COLONOSCOPY  03/19/2010   Dr. Gala Romney -(poor prep) Anal papilla, rectal hyperplastic polyp, tubular adenoma removed splenic flexure, left-sided diverticula  . COLONOSCOPY  11/28/2004   RMR:  Diminutive rectal and left colon polyps as described above, cold  biopsied/removed/  Left sided diverticula. The remainder of the colonic mucosa appeared normal.  . COLONOSCOPY   09/15/01   RMR: Multiple diminutive polyps destroyed with dermolysis as described above/ Multiple small polyps on stalks in the colon resected with  snare cautery/ Scattered pan colonic diverticulum/ The remainder of the colonic mucosa appeared normal  . COLONOSCOPY N/A 05/01/2015   Procedure: COLONOSCOPY;  Surgeon: Daneil Dolin, MD;  Location: [facility];  Service: Endoscopy;  Laterality: N/A;  1115  . CYSTECTOMY    . CYSTOSCOPY  04/2014  . CYSTOSTOMY W/ BLADDER BIOPSY    . FLEXIBLE SIGMOIDOSCOPY  2014   Dr. Olevia Perches: tubular adenoma, negative stool studies   . LUNG LOBECTOMY    . TONSILLECTOMY    . TRANSURETHRAL RESECTION OF PROSTATE    . VIDEO BRONCHOSCOPY WITH ENDOBRONCHIAL NAVIGATION N/A 10/10/2013   Procedure: VIDEO BRONCHOSCOPY WITH ENDOBRONCHIAL NAVIGATION;  Surgeon: Melrose Nakayama, MD;  Location: [facility];  Service: Thoracic;  Laterality: N/A;  NO BLOOD THINNERS BUT PATIENT HAS ITP  . WEDGE RESECTION  04/2011   carcinoma of lung       Home Medications    Prior to Admission medications   Medication Sig Start Date End Date Taking? Authorizing Provider  acetaminophen (TYLENOL) 500 MG tablet Take 500 mg by mouth every 6 (six) hours as needed for  mild pain.    Yes Historical Provider, MD  albuterol (PROAIR HFA) 108 (90 BASE) MCG/ACT inhaler Inhale 2 puffs into the lungs every 6 (six) hours as needed for wheezing. 11/15/14 05/06/18 Yes Mikey Kirschner, MD  ALPRAZolam Duanne Moron) 0.5 MG tablet TAKE ONE TABLET TWICE DAILY AS NEEDED FOR ANXIETY 09/24/15  Yes Mikey Kirschner, MD  aspirin EC 81 MG tablet Take 81 mg by mouth daily. Reported on 11/22/2015   Yes Historical Provider, MD  calcitRIOL (ROCALTROL) 0.25 MCG capsule Take 0.25 mcg by mouth daily.  05/20/15  Yes Historical Provider, MD  cetirizine (ZYRTEC) 10 MG tablet Take 1 tablet (10 mg total) by mouth daily. 11/15/14  Yes Mikey Kirschner, MD  citalopram (CELEXA) 20 MG tablet TAKE ONE (1) TABLET BY MOUTH EVERY DAY 01/28/16  Yes Mikey Kirschner, MD  ipratropium (ATROVENT) 0.06 % nasal spray Place 2 sprays into both nostrils 4 (four) times daily. 11/15/14  Yes Mikey Kirschner, MD   lisinopril (PRINIVIL,ZESTRIL) 5 MG tablet TAKE ONE TABLET ONCE DAILY 01/28/16  Yes Mikey Kirschner, MD  metoprolol (LOPRESSOR) 50 MG tablet TAKE ONE TABLET TWICE DAILY 12/06/15  Yes Mikey Kirschner, MD  niacin (NIASPAN) 500 MG CR tablet Take 2 tablets (1,000 mg total) by mouth at bedtime. 11/15/14  Yes Mikey Kirschner, MD  nitroGLYCERIN (NITROSTAT) 0.4 MG SL tablet Place 1 tablet (0.4 mg total) under the tongue every 5 (five) minutes as needed. Call MD if need more than 2 10/05/15  Yes Mikey Kirschner, MD  pravastatin (PRAVACHOL) 80 MG tablet TAKE ONE (1) TABLET EACH DAY 01/07/16  Yes Mikey Kirschner, MD  SPIRIVA HANDIHALER 18 MCG inhalation capsule INHALE 1 DOSE BY MOUTH ONCE DAILY 02/12/16  Yes Mikey Kirschner, MD    Family History Family History  Problem Relation Age of Onset  . Aortic aneurysm Mother   . Emphysema Father     smoker  . Clotting disorder Father   . Arthritis Father   . Hypertension Father   . Diabetes Father   . Aortic aneurysm Father   . Tremor Father   . Stroke Paternal Grandmother   . Other Paternal Grandfather     brain aneurysm  . Colon cancer Cousin     Social History Social History  Substance Use Topics  . Smoking status: Former Smoker    Packs/day: 1.00    Years: 50.00    Types: Cigarettes    Start date: 07/22/1955    Quit date: 12/19/2012  . Smokeless tobacco: Never Used  . Alcohol use No     Allergies   Codeine and Etodolac   Review of Systems Review of Systems  Constitutional: Negative for chills and fever.  HENT: Positive for congestion. Negative for postnasal drip, rhinorrhea, sinus pressure and sore throat.   Eyes: Positive for visual disturbance.  Respiratory: Negative for cough and shortness of breath.   Cardiovascular: Positive for leg swelling. Negative for chest pain.  Gastrointestinal: Positive for diarrhea. Negative for abdominal pain, nausea and vomiting.  Genitourinary: Negative for dysuria.  Musculoskeletal: Positive for  back pain.  Skin: Negative for rash.  Neurological: Positive for headaches. Negative for syncope.  Hematological: Does not bruise/bleed easily.  Psychiatric/Behavioral: Negative for confusion.  All other systems reviewed and are negative.  Physical Exam Updated Vital Signs BP 114/93   Pulse 64   Resp 17   Ht 6' (1.829 m)   Wt 121.6 kg   SpO2 90%   BMI  36.35 kg/m   Physical Exam  Constitutional: He is oriented to person, place, and time. He appears well-developed. He appears distressed.  HENT:  Head: Normocephalic.  Significant abrasions predominantly over the left face, nose and left eye down to the left cheek. Nose appears deformed to the right side. Dry blood noted mainly on the left face. No septal hematoma noted. No blood noted to the posterior pharynx. Lower lip appears bruised. Upper lip laceration over the vermilion border that measures 1 cm. Avulsion laceration which moves left to right from the prior laceration about 2 cm.   Eyes: Conjunctivae and EOM are normal. Pupils are equal, round, and reactive to light.  Hematoma noted to the left sclera   Neck: Normal range of motion. Neck supple. No tracheal deviation present.  Cardiovascular: Normal rate, regular rhythm, normal heart sounds and intact distal pulses.   Pulses:      Radial pulses are 2+ on the left side.  Pulmonary/Chest: Effort normal and breath sounds normal. No respiratory distress. He has no wheezes. He has no rales.  92% on RA during examination  Abdominal: Soft. Bowel sounds are normal. There is no tenderness.  Musculoskeletal: Normal range of motion. He exhibits no edema.  No edema noted to BLE.  Left hand: skin tear noted to the index finger on the dorsum of the hand that measures 1 cm. Palmar aspect NP region of the 5th finger he has an avulsion laceration that measures 2 cm.   Neurological: He is alert and oriented to person, place, and time. No cranial nerve deficit. He exhibits normal muscle tone.  Coordination normal.  Moves fingers and toes normally  Skin: Skin is warm and dry. Abrasion and laceration noted.  Psychiatric: He has a normal mood and affect. His behavior is normal.  Nursing note and vitals reviewed.  ED Treatments / Results  Labs (all labs ordered are listed, but only abnormal results are displayed) Labs Reviewed - No data to display  EKG  EKG Interpretation None       Radiology Ct Head Wo Contrast  Result Date: 03/21/2016 CLINICAL DATA:  Fall today. Left facial abrasions. History of colon, long and bladder cancer. EXAM: CT HEAD WITHOUT CONTRAST CT MAXILLOFACIAL WITHOUT CONTRAST CT CERVICAL SPINE WITHOUT CONTRAST TECHNIQUE: Multidetector CT imaging of the head, cervical spine, and maxillofacial structures were performed using the standard protocol without intravenous contrast. Multiplanar CT image reconstructions of the cervical spine and maxillofacial structures were also generated. COMPARISON:  PET-CT 10/03/2013. FINDINGS: CT HEAD FINDINGS Brain: There is a right hemispheric subdural hematoma layering over the frontal, parietal and temporal lobes. This measures up to 11 mm in thickness over the posterior parietal region. There is no resulting midline shift, intraparenchymal hematoma or hydrocephalus. There is no evidence of acute stroke, brain edema or mass lesion. Vascular: Intracranial vascular calcifications are present. Skull: Negative for fracture or focal lesion. Sinuses/Orbits: See below Other: None. CT MAXILLOFACIAL FINDINGS Mild irregularity of the nasal bones, potentially nondisplaced acute fractures. No other evidence of acute maxillofacial fracture. There is some mucosal thickening in the right frontal and left maxillary sinuses. No air-fluid levels are identified. The mastoid air cells and middle ears are clear. TMJ degenerative changes are present bilaterally. There is mild left supraorbital soft tissue swelling. No evidence of orbital hematoma. The globes are  intact. The optic nerves and extraocular muscles appear normal. CT CERVICAL SPINE FINDINGS The alignment is normal. There is no evidence of acute fracture or traumatic subluxation. There is  mild spondylosis, primarily at C1-2. No acute soft tissue findings or high-grade spinal stenosis demonstrated. Carotid arterial calcifications are present bilaterally. There is also atherosclerosis of the aortic arch. IMPRESSION: 1. Right hemispheric subdural hematoma as described. No resulting midline shift or hydrocephalus. 2. Possible nondisplaced nasal bone fractures. No other acute maxillofacial findings. 3. No evidence of acute cervical spine fracture, traumatic subluxation or static signs of instability. 4. Critical Value/emergent results were called by telephone at the time of interpretation on 03/21/2016 at 11:20 am to Dr. Fredia Sorrow , who verbally acknowledged these results. Electronically Signed   By: Richardean Sale M.D.   On: 03/21/2016 11:24   Ct Cervical Spine Wo Contrast  Result Date: 03/21/2016 CLINICAL DATA:  Fall today. Left facial abrasions. History of colon, long and bladder cancer. EXAM: CT HEAD WITHOUT CONTRAST CT MAXILLOFACIAL WITHOUT CONTRAST CT CERVICAL SPINE WITHOUT CONTRAST TECHNIQUE: Multidetector CT imaging of the head, cervical spine, and maxillofacial structures were performed using the standard protocol without intravenous contrast. Multiplanar CT image reconstructions of the cervical spine and maxillofacial structures were also generated. COMPARISON:  PET-CT 10/03/2013. FINDINGS: CT HEAD FINDINGS Brain: There is a right hemispheric subdural hematoma layering over the frontal, parietal and temporal lobes. This measures up to 11 mm in thickness over the posterior parietal region. There is no resulting midline shift, intraparenchymal hematoma or hydrocephalus. There is no evidence of acute stroke, brain edema or mass lesion. Vascular: Intracranial vascular calcifications are present. Skull:  Negative for fracture or focal lesion. Sinuses/Orbits: See below Other: None. CT MAXILLOFACIAL FINDINGS Mild irregularity of the nasal bones, potentially nondisplaced acute fractures. No other evidence of acute maxillofacial fracture. There is some mucosal thickening in the right frontal and left maxillary sinuses. No air-fluid levels are identified. The mastoid air cells and middle ears are clear. TMJ degenerative changes are present bilaterally. There is mild left supraorbital soft tissue swelling. No evidence of orbital hematoma. The globes are intact. The optic nerves and extraocular muscles appear normal. CT CERVICAL SPINE FINDINGS The alignment is normal. There is no evidence of acute fracture or traumatic subluxation. There is mild spondylosis, primarily at C1-2. No acute soft tissue findings or high-grade spinal stenosis demonstrated. Carotid arterial calcifications are present bilaterally. There is also atherosclerosis of the aortic arch. IMPRESSION: 1. Right hemispheric subdural hematoma as described. No resulting midline shift or hydrocephalus. 2. Possible nondisplaced nasal bone fractures. No other acute maxillofacial findings. 3. No evidence of acute cervical spine fracture, traumatic subluxation or static signs of instability. 4. Critical Value/emergent results were called by telephone at the time of interpretation on 03/21/2016 at 11:20 am to Dr. Fredia Sorrow , who verbally acknowledged these results. Electronically Signed   By: Richardean Sale M.D.   On: 03/21/2016 11:24   Dg Hand Complete Left  Result Date: 03/21/2016 CLINICAL DATA:  Pain following fall EXAM: LEFT HAND - COMPLETE 3+ VIEW COMPARISON:  Left thumb May 28, 2012 FINDINGS: Frontal, oblique, and lateral views were obtained. There is no demonstrable fracture or dislocation. There is mild osteoarthritic change in the first carpal -metacarpal and first IP joints. No erosive changes. No radiopaque foreign body or soft tissue air.  IMPRESSION: Areas of mild osteoarthritic change laterally. No fracture or dislocation. No radiopaque foreign body or soft tissue air. Electronically Signed   By: Lowella Grip III M.D.   On: 03/21/2016 10:20   Ct Maxillofacial Wo Cm  Result Date: 03/21/2016 CLINICAL DATA:  Fall today. Left facial abrasions. History of colon, long  and bladder cancer. EXAM: CT HEAD WITHOUT CONTRAST CT MAXILLOFACIAL WITHOUT CONTRAST CT CERVICAL SPINE WITHOUT CONTRAST TECHNIQUE: Multidetector CT imaging of the head, cervical spine, and maxillofacial structures were performed using the standard protocol without intravenous contrast. Multiplanar CT image reconstructions of the cervical spine and maxillofacial structures were also generated. COMPARISON:  PET-CT 10/03/2013. FINDINGS: CT HEAD FINDINGS Brain: There is a right hemispheric subdural hematoma layering over the frontal, parietal and temporal lobes. This measures up to 11 mm in thickness over the posterior parietal region. There is no resulting midline shift, intraparenchymal hematoma or hydrocephalus. There is no evidence of acute stroke, brain edema or mass lesion. Vascular: Intracranial vascular calcifications are present. Skull: Negative for fracture or focal lesion. Sinuses/Orbits: See below Other: None. CT MAXILLOFACIAL FINDINGS Mild irregularity of the nasal bones, potentially nondisplaced acute fractures. No other evidence of acute maxillofacial fracture. There is some mucosal thickening in the right frontal and left maxillary sinuses. No air-fluid levels are identified. The mastoid air cells and middle ears are clear. TMJ degenerative changes are present bilaterally. There is mild left supraorbital soft tissue swelling. No evidence of orbital hematoma. The globes are intact. The optic nerves and extraocular muscles appear normal. CT CERVICAL SPINE FINDINGS The alignment is normal. There is no evidence of acute fracture or traumatic subluxation. There is mild  spondylosis, primarily at C1-2. No acute soft tissue findings or high-grade spinal stenosis demonstrated. Carotid arterial calcifications are present bilaterally. There is also atherosclerosis of the aortic arch. IMPRESSION: 1. Right hemispheric subdural hematoma as described. No resulting midline shift or hydrocephalus. 2. Possible nondisplaced nasal bone fractures. No other acute maxillofacial findings. 3. No evidence of acute cervical spine fracture, traumatic subluxation or static signs of instability. 4. Critical Value/emergent results were called by telephone at the time of interpretation on 03/21/2016 at 11:20 am to Dr. Fredia Sorrow , who verbally acknowledged these results. Electronically Signed   By: Richardean Sale M.D.   On: 03/21/2016 11:24    Procedures Procedures    LACERATION REPAIR Performed by: Fredia Sorrow Authorized by: Fredia Sorrow Consent: Verbal consent obtained. Risks and benefits: risks, benefits and alternatives were discussed Consent given by: patient Patient identity confirmed: provided demographic data Prepped and Draped in normal sterile fashion Wound explored  Laceration Location: Central upper lip involving the vermilion border with soft tissue loss on the lip.  Laceration Length: 3 cm  No Foreign Bodies seen or palpated  Anesthesia: local infiltration  Local anesthetic: lidocaine 1% with epinephrine  Anesthetic total: 1.5 ml  Irrigation method: syringe Amount of cleaning: standard  Skin closure: Simple interrupted with 6-0 Prolene   Number of sutures: 3   Technique: Simple interrupted the Vermelion border aligned with first suture and second suture aligned the superior inferior aspect of the left upper lip. And then the third suture aligned the lower part of the lip left-to-right. Very reasonable cosmetic results. Laceration showed evidence of more of blunt soft-tissue tearing .  Patient tolerance: Patient tolerated the procedure well with  no immediate complications.   DIAGNOSTIC STUDIES: Oxygen Saturation is 92% on RA, low by my interpretation.    COORDINATION OF CARE: 9:48 AM Discussed next steps with pt. Pt verbalized understanding and is agreeable with the plan.    Medications Ordered in ED Medications  lidocaine-EPINEPHrine (XYLOCAINE W/EPI) 1 %-1:100000 (with pres) injection 10 mL (not administered)  Tdap (BOOSTRIX) injection 0.5 mL (0.5 mLs Intramuscular Given 03/21/16 1055)  acetaminophen (TYLENOL) tablet 650 mg (650 mg Oral Given 03/21/16 1136)  Initial Impression / Assessment and Plan / ED Course  I have reviewed the triage vital signs and the nursing notes.  Pertinent labs & imaging results that were available during my care of the patient were reviewed by me and considered in my medical decision making (see chart for details).  Clinical Course    Patient status post fall on driveway. Lost his balance the loss of consciousness. Patient not on blood thinners.  Patient with significant abrasions to the left side of his face. Patient with laceration to the upper lip with some involvement of the vermilion border. Will attempt to suture this back as best as possible. There is some soft tissue loss. Teeth are intact. CT of face shows possible small nasal bone fracture. Otherwise negative. CT neck negative. Patient with 2 superficial skin tears or lacerations to his left hand. No evidence of any bony fracture there.  CT head however shows an 11 mm subdural hematoma on the right side without any shift. Discussed with neurosurgery at [facility] felt the patient could have medicine admission here for observation. He did recommend admission did not feel it needed to be done by neurosurgery. In discussion with hospitalist here the transfer patient to the hospitalist service a [facility] for observation. Dr. Vertell Limber on call for neurosurgery recommended follow-up in his office if patient does well in 2 weeks with a follow-up CT scan of the  head prior to that visit.    I personally performed the services described in this documentation, which was scribed in my presence. The recorded information has been reviewed and is accurate.    Final Clinical Impressions(s) / ED Diagnoses   Final diagnoses:  Subdural hematoma ([place])  Fall, initial encounter  Abrasion    New Prescriptions New Prescriptions   No medications on file     Fredia Sorrow, MD 03/21/16 1455

## 2016-03-21 NOTE — Consult Note (Signed)
Reason for Consult:Right frontal subdural hematoma  Referring Physician: [name] is an 82 y.o. male.   HPI: Kyle [name] is an 82 y.o. male withhx of fall tendency, ITP with average platelet count of about 80K, on ASA 39m per day, hx of HTN, DM, CKD, obesity, hx of lung CA, bladder CA, fell today without LOC.  Evalaution in the ER included a head CT showing a 131msubdural hematoma, layering out over the parietal lobe, without shift or mass effect. EDP consulted Dr StVertell Limberf neurosurgery who felt that he unlikely to need any surgical intervention, and [facility].  If no progression, he recommneded a 2 week OP follow with with a head CT prior to the appointment.  Patient also has abrasion over his left face, and was given a Td shot.  He has a upper lip laceration, with CT of the maxillofacial bone showing possible non displaced fx of the nasal bone.  His CBC hadn't been done, and his Cr has not been done.  Hospitalist was asked to admit him for same.      Past Medical History:  Diagnosis Date  . AAA [place]dominal [place]) ([facility]   s/p repair 2004; 4.3 cm infrarenal in 05/2011  . Abnormality of gait 02/23/2013  . Adenocarcinoma of right lung ([facility]/12[place]2010   Ct A/P 2012:  2cm lung mass RLL PET [id number] Hypermetabolic RLL mass, no other hypermetabolic areas. TTNA 02/2011:  Adenocarcinoma, markers c/w lung origin Right lower lobe superior segmentectomy. 04/01/2011 Dr. BuArlyce Dice . Adenocarcinoma, lung ([facility]   transthoracic FNA; resection of the superior segment of the RLL in 03/2011; negative nodes; no chemotherapy nor radiation planned  . Arm fracture    right arm  . Arteriosclerotic cardiovascular disease (ASCVD) 1973, 12/2010   S/P NSTEMI secondary to distal RCA/PL lesion, tx medically.  EF of  55%-60% per  echo.  . Benign prostatic hypertrophy    s/p transurethral resection of the prostate  .  Bilateral renal masses    Cystic, more prominent on CT in 12/2010 than 2007; followed by Dr. DaRosana Hoes. [facility]   Transurethral resection of the bladder + chemotherapy/BCG as premed  . Cataract   . Chronic kidney disease    Creatinine 1.4 on discharge 12/20/2100; proteinuria; normal renal ultrasound in 2010; recent creatinines of 1.7-2.; Bilateral cystic renal masses by CT in 2011  . COPD (chronic obstructive pulmonary disease) ([facility]  . Coronary artery disease   . Cough    thick phlegm  . Diabetes mellitus    Type II  . Diplopia 02/23/2013  . Diverticulosis   . Essential and other specified forms of tremor 02/23/2013  . Hx of Clostridium difficile infection   . Hyperlipidemia   . Hypertension   . Insomnia   . ITP (idiopathic thrombocytopenic purpura) 09/07/2012   Chronic ITP of adults versus medication-induced ITP.  Stable  . Myocardial infarction ([facility]  . Nephrolithiasis 2012   ARF in 01/2011 due to obstructing nephrolithiasis  . Obesity   . OSA (obstructive sleep apnea)   . Polyneuropathy in diabetes(357.2) 02/23/2013  . Thrombocytopenia ([facility]  . Tobacco abuse    50-pack-year consumption; quit in 12/2010  . Tubular adenoma of colon   . Ventral hernia     Past Surgical History:  Procedure Laterality Date  . ABDOMINAL AORTIC ANEURYSM REPAIR  2004  .  CARDIAC CATHETERIZATION    . COLONOSCOPY  03/19/2010   Dr. Gala Romney -(poor prep) Anal papilla, rectal hyperplastic polyp, tubular adenoma removed splenic flexure, left-sided diverticula  . COLONOSCOPY  11/28/2004   RMR:  Diminutive rectal and left colon polyps as described above, cold  biopsied/removed/  Left sided diverticula. The remainder of the colonic mucosa appeared normal.  . COLONOSCOPY   09/15/01   RMR: Multiple diminutive polyps destroyed with dermolysis as described above/ Multiple small polyps on stalks in the colon resected with snare cautery/ Scattered pan colonic diverticulum/ The remainder of the colonic  mucosa appeared normal  . COLONOSCOPY N/A 05/01/2015   Procedure: COLONOSCOPY;  Surgeon: Daneil Dolin, MD;  [facility];  Service: Endoscopy;  Laterality: N/A;  1115  . CYSTECTOMY    . CYSTOSCOPY  04/2014  . CYSTOSTOMY W/ BLADDER BIOPSY    . FLEXIBLE SIGMOIDOSCOPY  2014   Dr. Olevia Perches: tubular adenoma, negative stool studies   . LUNG LOBECTOMY    . TONSILLECTOMY    . TRANSURETHRAL RESECTION OF PROSTATE    . VIDEO BRONCHOSCOPY WITH ENDOBRONCHIAL NAVIGATION N/A 10/10/2013   Procedure: VIDEO BRONCHOSCOPY WITH ENDOBRONCHIAL NAVIGATION;  Surgeon: Melrose Nakayama, MD;  Location: [place];  Service: Thoracic;  Laterality: N/A;  NO BLOOD THINNERS BUT PATIENT HAS ITP  . WEDGE RESECTION  04/2011   carcinoma of lung    Family History  Problem Relation Age of Onset  . Aortic aneurysm Mother   . Emphysema Father     smoker  . Clotting disorder Father   . Arthritis Father   . Hypertension Father   . Diabetes Father   . Aortic aneurysm Father   . Tremor Father   . Stroke Paternal Grandmother   . Other Paternal Grandfather     brain aneurysm  . Colon cancer Cousin     Social History:  reports that he quit smoking about 3 years ago. His smoking use included Cigarettes. He started smoking about 60 years ago. He has a 50.00 pack-year smoking history. He has never used smokeless tobacco. He reports that he does not drink alcohol or use drugs.  Allergies:  Allergies  Allergen Reactions  . Codeine Anaphylaxis  . Etodolac     dizziness    Medications: I have reviewed the patient's current medications.  Results for orders placed or performed during the hospital encounter of 03/21/16 (from the past 48 hour(s))  CBC with Differential/Platelet     Status: Abnormal   Collection Time: 03/21/16  1:00 PM  Result Value Ref Range   WBC 6.6 4.0 - 10.5 K/uL   RBC 4.14 (L) 4.22 - 5.81 MIL/uL   Hemoglobin 13.6 13.0 - 17.0 g/dL   HCT 41.2 39.0 - 52.0 %   MCV 99.5 78.0 - 100.0 fL   MCH  32.9 26.0 - 34.0 pg   MCHC 33.0 30.0 - 36.0 g/dL   RDW 15.6 (H) 11.5 - 15.5 %   Platelets 81 (L) 150 - 400 K/uL    Comment: SPECIMEN CHECKED FOR CLOTS PLATELET COUNT CONFIRMED BY SMEAR    Neutrophils Relative % 72 %   Neutro Abs 4.8 1.7 - 7.7 K/uL   Lymphocytes Relative 17 %   Lymphs Abs 1.1 0.7 - 4.0 K/uL   Monocytes Relative 8 %   Monocytes Absolute 0.6 0.1 - 1.0 K/uL   Eosinophils Relative 2 %   Eosinophils Absolute 0.2 0.0 - 0.7 K/uL   Basophils Relative 0 %   Basophils Absolute  0.0 0.0 - 0.1 K/uL  Comprehensive metabolic panel     Status: Abnormal   Collection Time: 03/21/16  1:00 PM  Result Value Ref Range   Sodium 141 135 - 145 mmol/L   Potassium 4.4 3.5 - 5.1 mmol/L   Chloride 106 101 - 111 mmol/L   CO2 27 22 - 32 mmol/L   Glucose, Bld 124 (H) 65 - 99 mg/dL   BUN 27 (H) 6 - 20 mg/dL   Creatinine, Ser 1.89 (H) 0.61 - 1.24 mg/dL   Calcium 9.5 8.9 - 10.3 mg/dL   Total Protein 6.7 6.5 - 8.1 g/dL   Albumin 3.8 3.5 - 5.0 g/dL   AST 16 15 - 41 U/L   ALT 12 (L) 17 - 63 U/L   Alkaline Phosphatase 50 38 - 126 U/L   Total Bilirubin 1.4 (H) 0.3 - 1.2 mg/dL   GFR calc non Af Amer 32 (L) >60 mL/min   GFR calc Af Amer 37 (L) >60 mL/min    Comment: (NOTE) The eGFR has been calculated using the CKD EPI equation. This calculation has not been validated in all clinical situations. eGFR's persistently <60 mL/min signify possible Chronic Kidney Disease.    Anion gap 8 5 - 15  Protime-INR     Status: None   Collection Time: 03/21/16  1:00 PM  Result Value Ref Range   Prothrombin Time 13.9 11.4 - 15.2 seconds   INR 1.07   Glucose, capillary     Status: Abnormal   Collection Time: 03/21/16  8:01 PM  Result Value Ref Range   Glucose-Capillary 159 (H) 65 - 99 mg/dL   Comment 1 Notify RN     Ct Head Wo Contrast  Result Date: 03/21/2016 CLINICAL DATA:  Fall today. Left facial abrasions. History of colon, long and bladder cancer. EXAM: CT HEAD WITHOUT CONTRAST CT MAXILLOFACIAL  WITHOUT CONTRAST CT CERVICAL SPINE WITHOUT CONTRAST TECHNIQUE: Multidetector CT imaging of the head, cervical spine, and maxillofacial structures were performed using the standard protocol without intravenous contrast. Multiplanar CT image reconstructions of the cervical spine and maxillofacial structures were also generated. COMPARISON:  PET-CT 10/03/2013. FINDINGS: CT HEAD FINDINGS Brain: There is a right hemispheric subdural hematoma layering over the frontal, parietal and temporal lobes. This measures up to 11 mm in thickness over the posterior parietal region. There is no resulting midline shift, intraparenchymal hematoma or hydrocephalus. There is no evidence of acute stroke, brain edema or mass lesion. Vascular: Intracranial vascular calcifications are present. Skull: Negative for fracture or focal lesion. Sinuses/Orbits: See below Other: None. CT MAXILLOFACIAL FINDINGS Mild irregularity of the nasal bones, potentially nondisplaced acute fractures. No other evidence of acute maxillofacial fracture. There is some mucosal thickening in the right frontal and left maxillary sinuses. No air-fluid levels are identified. The mastoid air cells and middle ears are clear. TMJ degenerative changes are present bilaterally. There is mild left supraorbital soft tissue swelling. No evidence of orbital hematoma. The globes are intact. The optic nerves and extraocular muscles appear normal. CT CERVICAL SPINE FINDINGS The alignment is normal. There is no evidence of acute fracture or traumatic subluxation. There is mild spondylosis, primarily at C1-2. No acute soft tissue findings or high-grade spinal stenosis demonstrated. Carotid arterial calcifications are present bilaterally. There is also atherosclerosis of the aortic arch. IMPRESSION: 1. Right hemispheric subdural hematoma as described. No resulting midline shift or hydrocephalus. 2. Possible nondisplaced nasal bone fractures. No other acute maxillofacial findings. 3. No  evidence of acute cervical spine  fracture, traumatic subluxation or static signs of instability. 4. Critical Value/emergent results were called by telephone at the time of interpretation on 03/21/2016 at 11:20 am to Dr. Fredia Sorrow , who verbally acknowledged these results. Electronically Signed   By: Richardean Sale M.D.   On: 03/21/2016 11:24   Ct Cervical Spine Wo Contrast  Result Date: 03/21/2016 CLINICAL DATA:  Fall today. Left facial abrasions. History of colon, long and bladder cancer. EXAM: CT HEAD WITHOUT CONTRAST CT MAXILLOFACIAL WITHOUT CONTRAST CT CERVICAL SPINE WITHOUT CONTRAST TECHNIQUE: Multidetector CT imaging of the head, cervical spine, and maxillofacial structures were performed using the standard protocol without intravenous contrast. Multiplanar CT image reconstructions of the cervical spine and maxillofacial structures were also generated. COMPARISON:  PET-CT 10/03/2013. FINDINGS: CT HEAD FINDINGS Brain: There is a right hemispheric subdural hematoma layering over the frontal, parietal and temporal lobes. This measures up to 11 mm in thickness over the posterior parietal region. There is no resulting midline shift, intraparenchymal hematoma or hydrocephalus. There is no evidence of acute stroke, brain edema or mass lesion. Vascular: Intracranial vascular calcifications are present. Skull: Negative for fracture or focal lesion. Sinuses/Orbits: See below Other: None. CT MAXILLOFACIAL FINDINGS Mild irregularity of the nasal bones, potentially nondisplaced acute fractures. No other evidence of acute maxillofacial fracture. There is some mucosal thickening in the right frontal and left maxillary sinuses. No air-fluid levels are identified. The mastoid air cells and middle ears are clear. TMJ degenerative changes are present bilaterally. There is mild left supraorbital soft tissue swelling. No evidence of orbital hematoma. The globes are intact. The optic nerves and extraocular muscles appear  normal. CT CERVICAL SPINE FINDINGS The alignment is normal. There is no evidence of acute fracture or traumatic subluxation. There is mild spondylosis, primarily at C1-2. No acute soft tissue findings or high-grade spinal stenosis demonstrated. Carotid arterial calcifications are present bilaterally. There is also atherosclerosis of the aortic arch. IMPRESSION: 1. Right hemispheric subdural hematoma as described. No resulting midline shift or hydrocephalus. 2. Possible nondisplaced nasal bone fractures. No other acute maxillofacial findings. 3. No evidence of acute cervical spine fracture, traumatic subluxation or static signs of instability. 4. Critical Value/emergent results were called by telephone at the time of interpretation on 03/21/2016 at 11:20 am to Dr. Fredia Sorrow , who verbally acknowledged these results. Electronically Signed   By: Richardean Sale M.D.   On: 03/21/2016 11:24   Dg Hand Complete Left  Result Date: 03/21/2016 CLINICAL DATA:  Pain following fall EXAM: LEFT HAND - COMPLETE 3+ VIEW COMPARISON:  Left thumb May 28, 2012 FINDINGS: Frontal, oblique, and lateral views were obtained. There is no demonstrable fracture or dislocation. There is mild osteoarthritic change in the first carpal -metacarpal and first IP joints. No erosive changes. No radiopaque foreign body or soft tissue air. IMPRESSION: Areas of mild osteoarthritic change laterally. No fracture or dislocation. No radiopaque foreign body or soft tissue air. Electronically Signed   By: Lowella Grip III M.D.   On: 03/21/2016 10:20   Ct Maxillofacial Wo Cm  Result Date: 03/21/2016 CLINICAL DATA:  Fall today. Left facial abrasions. History of colon, long and bladder cancer. EXAM: CT HEAD WITHOUT CONTRAST CT MAXILLOFACIAL WITHOUT CONTRAST CT CERVICAL SPINE WITHOUT CONTRAST TECHNIQUE: Multidetector CT imaging of the head, cervical spine, and maxillofacial structures were performed using the standard protocol without intravenous  contrast. Multiplanar CT image reconstructions of the cervical spine and maxillofacial structures were also generated. COMPARISON:  PET-CT 10/03/2013. FINDINGS: CT HEAD FINDINGS Brain:  There is a right hemispheric subdural hematoma layering over the frontal, parietal and temporal lobes. This measures up to 11 mm in thickness over the posterior parietal region. There is no resulting midline shift, intraparenchymal hematoma or hydrocephalus. There is no evidence of acute stroke, brain edema or mass lesion. Vascular: Intracranial vascular calcifications are present. Skull: Negative for fracture or focal lesion. Sinuses/Orbits: See below Other: None. CT MAXILLOFACIAL FINDINGS Mild irregularity of the nasal bones, potentially nondisplaced acute fractures. No other evidence of acute maxillofacial fracture. There is some mucosal thickening in the right frontal and left maxillary sinuses. No air-fluid levels are identified. The mastoid air cells and middle ears are clear. TMJ degenerative changes are present bilaterally. There is mild left supraorbital soft tissue swelling. No evidence of orbital hematoma. The globes are intact. The optic nerves and extraocular muscles appear normal. CT CERVICAL SPINE FINDINGS The alignment is normal. There is no evidence of acute fracture or traumatic subluxation. There is mild spondylosis, primarily at C1-2. No acute soft tissue findings or high-grade spinal stenosis demonstrated. Carotid arterial calcifications are present bilaterally. There is also atherosclerosis of the aortic arch. IMPRESSION: 1. Right hemispheric subdural hematoma as described. No resulting midline shift or hydrocephalus. 2. Possible nondisplaced nasal bone fractures. No other acute maxillofacial findings. 3. No evidence of acute cervical spine fracture, traumatic subluxation or static signs of instability. 4. Critical Value/emergent results were called by telephone at the time of interpretation on 03/21/2016 at 11:20  am to Dr. Fredia Sorrow , who verbally acknowledged these results. Electronically Signed   By: Richardean Sale M.D.   On: 03/21/2016 11:24    Review of Systems - Negative except As above    Blood pressure 134/77, pulse 73, temperature 98.4 F (36.9 C), temperature source Oral, resp. rate (!) 22, height 6' (1.829 m), weight 120.8 kg (266 lb 5.1 oz), SpO2 93 %. Physical Exam  Constitutional: He is oriented to person, place, and time. He appears well-developed and well-nourished.  HENT:  Head: Head is with contusion.    Eyes: Conjunctivae and EOM are normal. Pupils are equal, round, and reactive to light.  Neck: Trachea normal, normal range of motion and phonation normal. Neck supple. No thyroid mass and no thyromegaly present.  Neurological: He is alert and oriented to person, place, and time. He has normal strength and normal reflexes. No cranial nerve deficit or sensory deficit. GCS eye subscore is 4. GCS verbal subscore is 5. GCS motor subscore is 6.    Assessment/Plan: Patient has small right frontal SDH after a fall.  He will be observed in 3300 overnight with repeat head CT in AM.  Patient's neurological exam is currently without concerning findings.  Peggyann Shoals, MD 03/21/2016, 9:37 PM

## 2016-03-21 NOTE — ED Notes (Signed)
MD at bedside. 

## 2016-03-21 NOTE — H&P (Signed)
History and Physical    MOSS BERRY PZW:[id number] DOB: October 19, 1933 DOA: 03/21/2016  PCP: Mickie Hillier, MD   Patient coming from: Home   Chief Complaint: Fall.  HPI: Kyle [name] is an 82 y.o. male withhx of fall tendency, ITP with average platelet count of about 80K, on ASA '81mg'$  per day, hx of HTN, DM, CKD, obesity, hx of lung CA, bladder CA, fell today without LOC.  Evalaution in the ER included a head CT showing a 45m subdural hematoma, layering out over the parietal lobe, without shift or mass effect. EDP consulted Dr SVertell Limberof neurosurgery who felt that he unlikely to need any surgical intervention, and recommended that [facility].  If no progression, he recommneded a 2 week OP follow with with a head CT prior to the appointment.  Patient also has abrasion over his left face, and was given a Td shot.  He has a upper lip laceration, with CT of the maxillofacial bone showing possible non displaced fx of the nasal bone.  His CBC hadn't been done, and his Cr has not been done.  Hospitalist was asked to admit him for same.     Rewiew of Systems:  Constitutional: Negative for malaise, fever and chills. No significant weight loss or weight gain Eyes: Negative for eye pain, redness and discharge, diplopia, visual changes, or flashes of light. ENMT: Negative for ear pain, hoarseness, nasal congestion, sinus pressure and sore throat. No headaches; tinnitus, drooling, or problem swallowing. Cardiovascular: Negative for chest pain, palpitations, diaphoresis, dyspnea and peripheral edema. ; No orthopnea, PND Respiratory: Negative for cough, hemoptysis, wheezing and stridor. No pleuritic chestpain. Gastrointestinal: Negative for diarrhea, constipation,  melena, blood in stool, hematemesis, jaundice and rectal bleeding.    Genitourinary: Negative for frequency, dysuria, incontinence,flank pain and hematuria; Musculoskeletal: Negative for back pain  and neck pain. Negative for swelling and trauma.;  Skin: . Negative for pruritus, rash, abrasions, bruising and skin lesion.; ulcerations Neuro: Negative for headache, lightheadedness and neck stiffness. Negative for weakness, altered level of consciousness , altered mental status, extremity weakness, burning feet, involuntary movement, seizure and syncope.  Psych: negative for anxiety, depression, insomnia, tearfulness, panic attacks, hallucinations, paranoia, suicidal or homicidal ideation    Past Medical History:  Diagnosis Date  . AAA (abdominal aortic aneurysm) ([facility] 2004   s/p repair 2004; 4.3 cm infrarenal in 05/2011  . Abnormality of gait 02/23/2013  . Adenocarcinoma of right lung ([facility] 02/24/2011   Ct A/P 2012:  2cm lung mass RLL PET 24235  Hypermetabolic RLL mass, no other hypermetabolic areas. TTNA 02/2011:  Adenocarcinoma, markers c/w lung origin Right lower lobe superior segmentectomy. 04/01/2011 Dr. BArlyce Dice  . [place] 01[place]   transthoracic FNA; resection of the superior segment of the RLL in 03/2011; negative nodes; no chemotherapy nor radiation planned  . Arm fracture    right arm  . Arteriosclerotic cardiovascular disease (ASCVD) 1973, 12/2010   S/P NSTEMI secondary to distal RCA/PL lesion, tx medically.  EF of  55%-60% per  echo.  . Benign prostatic hypertrophy    s/p transurethral resection of the prostate  . Bilateral renal masses    Cystic, more prominent on CT in 12/2010 than 2007; followed by Dr. DRosana Hoes . Bladder cancer ([facility] 1996   Transurethral resection of the bladder + chemotherapy/BCG as premed  . Cataract   . Chronic kidney disease    Creatinine 1.4 on discharge 12/20/2100; proteinuria; normal  renal ultrasound in 2010; recent creatinines of 1.7-2.; Bilateral cystic renal masses by CT in 2011  . COPD (chronic obstructive pulmonary disease) ([place])   . Coronary artery disease   . Cough    thick phlegm  . Diabetes mellitus    Type II  . Diplopia  02/23/2013  . Diverticulosis   . Essential and other specified forms of tremor 02/23/2013  . Hx of Clostridium difficile infection   . Hyperlipidemia   . Hypertension   . Insomnia   . ITP (idiopathic thrombocytopenic purpura) 09/07/2012   Chronic ITP of adults versus medication-induced ITP.  Stable  . Myocardial infarction ([place])   . Nephrolithiasis 2012   ARF in 01/2011 due to obstructing nephrolithiasis  . Obesity   . OSA (obstructive sleep apnea)   . Polyneuropathy in diabetes(357.2) 02/23/2013  . Thrombocytopenia ([place])   . Tobacco abuse    50-pack-year consumption; quit in 12/2010  . Tubular adenoma of colon   . Ventral hernia     Rewiew of Systems:  Constitutional: Negative for malaise, fever and chills. No significant weight loss or weight gain Eyes: Negative for eye pain, redness and discharge, diplopia, visual changes, or flashes of light. ENMT: Negative for ear pain, hoarseness, nasal congestion, sinus pressure and sore throat. No headaches; tinnitus, drooling, or problem swallowing.  Upper lip laceration.  Abrasion and contusion of the left face as well.  Cardiovascular: Negative for chest pain, palpitations, diaphoresis, dyspnea and peripheral edema. ; No orthopnea, PND Respiratory: Negative for cough, hemoptysis, wheezing and stridor. No pleuritic chestpain. Gastrointestinal: Negative for nausea, vomiting, diarrhea, constipation, abdominal pain, melena, blood in stool, hematemesis, jaundice and rectal bleeding.    Genitourinary: Negative for frequency, dysuria, incontinence,flank pain and hematuria; Musculoskeletal: Negative for back pain and neck pain. Negative for swelling and trauma.;  Skin: . Negative for pruritus, rash, abrasions, bruising and skin lesion.; ulcerations Neuro: Negative for headache, lightheadedness and neck stiffness. Negative for weakness, altered level of consciousness , altered mental status, extremity weakness, burning feet, involuntary movement, seizure and  syncope.  Psych: negative for anxiety, depression, insomnia, tearfulness, panic attacks, hallucinations, paranoia, suicidal or homicidal ideation   Past Surgical History:  Procedure Laterality Date  . ABDOMINAL AORTIC ANEURYSM REPAIR  2004  . CARDIAC CATHETERIZATION    . COLONOSCOPY  03/19/2010   Dr. Gala Romney -(poor prep) Anal papilla, rectal hyperplastic polyp, tubular adenoma removed splenic flexure, left-sided diverticula  . COLONOSCOPY  11/28/2004   RMR:  Diminutive rectal and left colon polyps as described above, cold  biopsied/removed/  Left sided diverticula. The remainder of the colonic mucosa appeared normal.  . COLONOSCOPY   09/15/01   RMR: Multiple diminutive polyps destroyed with dermolysis as described above/ Multiple small polyps on stalks in the colon resected with snare cautery/ Scattered pan colonic diverticulum/ The remainder of the colonic mucosa appeared normal  . COLONOSCOPY N/A 05/01/2015   Procedure: COLONOSCOPY;  Surgeon: Daneil Dolin, MD;  Location: [facility];  Service: Endoscopy;  Laterality: N/A;  1115  . CYSTECTOMY    . CYSTOSCOPY  04/2014  . CYSTOSTOMY W/ BLADDER BIOPSY    . FLEXIBLE SIGMOIDOSCOPY  2014   Dr. Olevia Perches: tubular adenoma, negative stool studies   . LUNG LOBECTOMY    . TONSILLECTOMY    . TRANSURETHRAL RESECTION OF PROSTATE    . VIDEO BRONCHOSCOPY WITH ENDOBRONCHIAL NAVIGATION N/A 10/10/2013   Procedure: VIDEO BRONCHOSCOPY WITH ENDOBRONCHIAL NAVIGATION;  Surgeon: Melrose Nakayama, MD;  Location: [place];  Service: Thoracic;  Laterality: N/A;  NO BLOOD THINNERS BUT PATIENT HAS ITP  . WEDGE RESECTION  04/2011   carcinoma of lung     reports that he quit smoking about 3 years ago. His smoking use included Cigarettes. He started smoking about 60 years ago. He has a 50.00 pack-year smoking history. He has never used smokeless tobacco. He reports that he does not drink alcohol or use drugs.  Allergies  Allergen Reactions  . Codeine Anaphylaxis  .  Etodolac     dizziness    Family History  Problem Relation Age of Onset  . Aortic aneurysm Mother   . Emphysema Father     smoker  . Clotting disorder Father   . Arthritis Father   . Hypertension Father   . Diabetes Father   . Aortic aneurysm Father   . Tremor Father   . Stroke Paternal Grandmother   . Other Paternal Grandfather     brain aneurysm  . Colon cancer Cousin      Prior to Admission medications   Medication Sig Start Date End Date Taking? Authorizing Provider  acetaminophen (TYLENOL) 500 MG tablet Take 500 mg by mouth every 6 (six) hours as needed for mild pain.    Yes Historical Provider, MD  albuterol (PROAIR HFA) 108 (90 BASE) MCG/ACT inhaler Inhale 2 puffs into the lungs every 6 (six) hours as needed for wheezing. 11/15/14 05/06/18 Yes Mikey Kirschner, MD  ALPRAZolam Duanne Moron) 0.5 MG tablet TAKE ONE TABLET TWICE DAILY AS NEEDED FOR ANXIETY 09/24/15  Yes Mikey Kirschner, MD  aspirin EC 81 MG tablet Take 81 mg by mouth daily. Reported on 11/22/2015   Yes Historical Provider, MD  calcitRIOL (ROCALTROL) 0.25 MCG capsule Take 0.25 mcg by mouth daily.  05/20/15  Yes Historical Provider, MD  cetirizine (ZYRTEC) 10 MG tablet Take 1 tablet (10 mg total) by mouth daily. 11/15/14  Yes Mikey Kirschner, MD  citalopram (CELEXA) 20 MG tablet TAKE ONE (1) TABLET BY MOUTH EVERY DAY 01/28/16  Yes Mikey Kirschner, MD  ipratropium (ATROVENT) 0.06 % nasal spray Place 2 sprays into both nostrils 4 (four) times daily. 11/15/14  Yes Mikey Kirschner, MD  lisinopril (PRINIVIL,ZESTRIL) 5 MG tablet TAKE ONE TABLET ONCE DAILY 01/28/16  Yes Mikey Kirschner, MD  metoprolol (LOPRESSOR) 50 MG tablet TAKE ONE TABLET TWICE DAILY 12/06/15  Yes Mikey Kirschner, MD  niacin (NIASPAN) 500 MG CR tablet Take 2 tablets (1,000 mg total) by mouth at bedtime. 11/15/14  Yes Mikey Kirschner, MD  nitroGLYCERIN (NITROSTAT) 0.4 MG SL tablet Place 1 tablet (0.4 mg total) under the tongue every 5 (five) minutes as needed.  Call MD if need more than 2 10/05/15  Yes Mikey Kirschner, MD  pravastatin (PRAVACHOL) 80 MG tablet TAKE ONE (1) TABLET EACH DAY 01/07/16  Yes Mikey Kirschner, MD  SPIRIVA HANDIHALER 18 MCG inhalation capsule INHALE 1 DOSE BY MOUTH ONCE DAILY 02/12/16  Yes Mikey Kirschner, MD    Physical Exam: Vitals:   03/21/16 1300 03/21/16 1311 03/21/16 1315 03/21/16 1330  BP: 133/78 141/66  114/93  Pulse: (!) 59 60 (!) 58 64  Resp:  17    SpO2: 93% 93% 96% 90%  Weight:      Height:          Constitutional: NAD, calm, comfortable Vitals:   03/21/16 1300 03/21/16 1311 03/21/16 1315 03/21/16 1330  BP: 133/78 141/66  114/93  Pulse: (!) 59 60 (!) 58 64  Resp:  17    SpO2: 93% 93% 96% 90%  Weight:      Height:       Eyes: PERRL, lids and conjunctivae normal ENMT: Mucous membranes are moist. Posterior pharynx clear of any exudate or lesions.Normal dentition.  Neck: normal, supple, no masses, no thyromegaly Respiratory: clear to auscultation bilaterally, no wheezing, no crackles. Normal respiratory effort. No accessory muscle use.  Cardiovascular: Regular rate and rhythm, no murmurs / rubs / gallops. No extremity edema. 2+ pedal pulses. No carotid bruits.  Abdomen: no tenderness, no masses palpated. No hepatosplenomegaly. Bowel sounds positive.  Musculoskeletal: no clubbing / cyanosis. No joint deformity upper and lower extremities. Good ROM, no contractures. Normal muscle tone.  Skin: no rashes, lesions, ulcers. No induration Neurologic: CN 2-12 grossly intact. Sensation intact, DTR normal. Strength 5/5 in all 4.  Psychiatric: Normal judgment and insight. Alert and oriented x 3. Normal mood.     Labs on Admission: I have personally reviewed following labs and imaging studies  CBC: Urine analysis:    Component Value Date/Time   COLORURINE YELLOW 10/06/2013 1527   APPEARANCEUR CLOUDY (A) 10/06/2013 1527   LABSPEC 1.018 10/06/2013 1527   PHURINE 5.5 10/06/2013 1527   GLUCOSEU NEGATIVE  10/06/2013 1527   HGBUR LARGE (A) 10/06/2013 1527   BILIRUBINUR NEGATIVE 10/06/2013 1527   KETONESUR NEGATIVE 10/06/2013 1527   PROTEINUR 100 (A) 10/06/2013 1527   UROBILINOGEN 1.0 10/06/2013 1527   NITRITE NEGATIVE 10/06/2013 1527   LEUKOCYTESUR MODERATE (A) 10/06/2013 1527   Radiological Exams on Admission: Ct Head Wo Contrast  Result Date: 03/21/2016 CLINICAL DATA:  Fall today. Left facial abrasions. History of colon, long and bladder cancer. EXAM: CT HEAD WITHOUT CONTRAST CT MAXILLOFACIAL WITHOUT CONTRAST CT CERVICAL SPINE WITHOUT CONTRAST TECHNIQUE: Multidetector CT imaging of the head, cervical spine, and maxillofacial structures were performed using the standard protocol without intravenous contrast. Multiplanar CT image reconstructions of the cervical spine and maxillofacial structures were also generated. COMPARISON:  PET-CT 10/03/2013. FINDINGS: CT HEAD FINDINGS Brain: There is a right hemispheric subdural hematoma layering over the frontal, parietal and temporal lobes. This measures up to 11 mm in thickness over the posterior parietal region. There is no resulting midline shift, intraparenchymal hematoma or hydrocephalus. There is no evidence of acute stroke, brain edema or mass lesion. Vascular: Intracranial vascular calcifications are present. Skull: Negative for fracture or focal lesion. Sinuses/Orbits: See below Other: None. CT MAXILLOFACIAL FINDINGS Mild irregularity of the nasal bones, potentially nondisplaced acute fractures. No other evidence of acute maxillofacial fracture. There is some mucosal thickening in the right frontal and left maxillary sinuses. No air-fluid levels are identified. The mastoid air cells and middle ears are clear. TMJ degenerative changes are present bilaterally. There is mild left supraorbital soft tissue swelling. No evidence of orbital hematoma. The globes are intact. The optic nerves and extraocular muscles appear normal. CT CERVICAL SPINE FINDINGS The  alignment is normal. There is no evidence of acute fracture or traumatic subluxation. There is mild spondylosis, primarily at C1-2. No acute soft tissue findings or high-grade spinal stenosis demonstrated. Carotid arterial calcifications are present bilaterally. There is also atherosclerosis of the aortic arch. IMPRESSION: 1. Right hemispheric subdural hematoma as described. No resulting midline shift or hydrocephalus. 2. Possible nondisplaced nasal bone fractures. No other acute maxillofacial findings. 3. No evidence of acute cervical spine fracture, traumatic subluxation or static signs of instability. 4. Critical Value/emergent results were called by telephone at the time of interpretation on 03/21/2016 at 11:20  am to Dr. Fredia Sorrow , who verbally acknowledged these results. Electronically Signed   By: Richardean Sale M.D.   On: 03/21/2016 11:24   Ct Cervical Spine Wo Contrast  Result Date: 03/21/2016 CLINICAL DATA:  Fall today. Left facial abrasions. History of colon, long and bladder cancer. EXAM: CT HEAD WITHOUT CONTRAST CT MAXILLOFACIAL WITHOUT CONTRAST CT CERVICAL SPINE WITHOUT CONTRAST TECHNIQUE: Multidetector CT imaging of the head, cervical spine, and maxillofacial structures were performed using the standard protocol without intravenous contrast. Multiplanar CT image reconstructions of the cervical spine and maxillofacial structures were also generated. COMPARISON:  PET-CT 10/03/2013. FINDINGS: CT HEAD FINDINGS Brain: There is a right hemispheric subdural hematoma layering over the frontal, parietal and temporal lobes. This measures up to 11 mm in thickness over the posterior parietal region. There is no resulting midline shift, intraparenchymal hematoma or hydrocephalus. There is no evidence of acute stroke, brain edema or mass lesion. Vascular: Intracranial vascular calcifications are present. Skull: Negative for fracture or focal lesion. Sinuses/Orbits: See below Other: None. CT MAXILLOFACIAL  FINDINGS Mild irregularity of the nasal bones, potentially nondisplaced acute fractures. No other evidence of acute maxillofacial fracture. There is some mucosal thickening in the right frontal and left maxillary sinuses. No air-fluid levels are identified. The mastoid air cells and middle ears are clear. TMJ degenerative changes are present bilaterally. There is mild left supraorbital soft tissue swelling. No evidence of orbital hematoma. The globes are intact. The optic nerves and extraocular muscles appear normal. CT CERVICAL SPINE FINDINGS The alignment is normal. There is no evidence of acute fracture or traumatic subluxation. There is mild spondylosis, primarily at C1-2. No acute soft tissue findings or high-grade spinal stenosis demonstrated. Carotid arterial calcifications are present bilaterally. There is also atherosclerosis of the aortic arch. IMPRESSION: 1. Right hemispheric subdural hematoma as described. No resulting midline shift or hydrocephalus. 2. Possible nondisplaced nasal bone fractures. No other acute maxillofacial findings. 3. No evidence of acute cervical spine fracture, traumatic subluxation or static signs of instability. 4. Critical Value/emergent results were called by telephone at the time of interpretation on 03/21/2016 at 11:20 am to Dr. Fredia Sorrow , who verbally acknowledged these results. Electronically Signed   By: Richardean Sale M.D.   On: 03/21/2016 11:24   Dg Hand Complete Left  Result Date: 03/21/2016 CLINICAL DATA:  Pain following fall EXAM: LEFT HAND - COMPLETE 3+ VIEW COMPARISON:  Left thumb May 28, 2012 FINDINGS: Frontal, oblique, and lateral views were obtained. There is no demonstrable fracture or dislocation. There is mild osteoarthritic change in the first carpal -metacarpal and first IP joints. No erosive changes. No radiopaque foreign body or soft tissue air. IMPRESSION: Areas of mild osteoarthritic change laterally. No fracture or dislocation. No radiopaque  foreign body or soft tissue air. Electronically Signed   By: Lowella Grip III M.D.   On: 03/21/2016 10:20   Ct Maxillofacial Wo Cm  Result Date: 03/21/2016 CLINICAL DATA:  Fall today. Left facial abrasions. History of colon, long and bladder cancer. EXAM: CT HEAD WITHOUT CONTRAST CT MAXILLOFACIAL WITHOUT CONTRAST CT CERVICAL SPINE WITHOUT CONTRAST TECHNIQUE: Multidetector CT imaging of the head, cervical spine, and maxillofacial structures were performed using the standard protocol without intravenous contrast. Multiplanar CT image reconstructions of the cervical spine and maxillofacial structures were also generated. COMPARISON:  PET-CT 10/03/2013. FINDINGS: CT HEAD FINDINGS Brain: There is a right hemispheric subdural hematoma layering over the frontal, parietal and temporal lobes. This measures up to 11 mm in thickness over the  posterior parietal region. There is no resulting midline shift, intraparenchymal hematoma or hydrocephalus. There is no evidence of acute stroke, brain edema or mass lesion. Vascular: Intracranial vascular calcifications are present. Skull: Negative for fracture or focal lesion. Sinuses/Orbits: See below Other: None. CT MAXILLOFACIAL FINDINGS Mild irregularity of the nasal bones, potentially nondisplaced acute fractures. No other evidence of acute maxillofacial fracture. There is some mucosal thickening in the right frontal and left maxillary sinuses. No air-fluid levels are identified. The mastoid air cells and middle ears are clear. TMJ degenerative changes are present bilaterally. There is mild left supraorbital soft tissue swelling. No evidence of orbital hematoma. The globes are intact. The optic nerves and extraocular muscles appear normal. CT CERVICAL SPINE FINDINGS The alignment is normal. There is no evidence of acute fracture or traumatic subluxation. There is mild spondylosis, primarily at C1-2. No acute soft tissue findings or high-grade spinal stenosis demonstrated.  Carotid arterial calcifications are present bilaterally. There is also atherosclerosis of the aortic arch. IMPRESSION: 1. Right hemispheric subdural hematoma as described. No resulting midline shift or hydrocephalus. 2. Possible nondisplaced nasal bone fractures. No other acute maxillofacial findings. 3. No evidence of acute cervical spine fracture, traumatic subluxation or static signs of instability. 4. Critical Value/emergent results were called by telephone at the time of interpretation on 03/21/2016 at 11:20 am to Dr. Fredia Sorrow , who verbally acknowledged these results. Electronically Signed   By: Richardean Sale M.D.   On: 03/21/2016 11:24    EKG: Independently reviewed.   Assessment/Plan Principal Problem:   Subdural hematoma, post-traumatic (HCC) Active Problems:   Hypertension   CKD (chronic kidney disease) stage 3, GFR 30-59 ml/min   ITP (idiopathic thrombocytopenic purpura)   Diabetes (HCC)    PLAN:   Subdural Hematoma:  Acute,  In the setting of patient on ASA and has hx of ITP.  I would feel better if he is transfer to [facility], even only as an observational status, since [facility] has neurosurgery service.  Will admit to SDU.  I spoke with Dr Thereasa Solo and updated him.  He will need platelet count and INR.  D/C ASA.  If neurologically deteriorates, please call neurosurgery for intervention.  Dr Vertell Limber is aware of admission.   ITP:  In this setting, I would have lower threadhold to transfuse platelet.  Will transfuse if less than 50K.  DM:  Will given D5NS, and follow SSI.  He will be made NPO.  CKD:  Avoid nephrotoxic drugs.  Check Cr.    DVT prophylaxis: SCD Code Status: FULL CODE.  Family Communication: Wife and Son at bedside.  Disposition Plan: To [facility] for admission.  Consults called: Neurosurgery, Dr Vertell Limber.  Admission status: Inpatient.    Daison Braxton MD FACP. [facility]    If 7PM-7AM, please contact night-coverage [email or web address] Password [email or web address]  03/21/2016, 2:25 PM

## 2016-03-21 NOTE — ED Notes (Signed)
Patient transported to CT 

## 2016-03-22 ENCOUNTER — Inpatient Hospital Stay (HOSPITAL_COMMUNITY): Payer: Medicare Other

## 2016-03-22 ENCOUNTER — Encounter (HOSPITAL_COMMUNITY): Payer: Self-pay | Admitting: *Deleted

## 2016-03-22 DIAGNOSIS — I10 Essential (primary) hypertension: Secondary | ICD-10-CM

## 2016-03-22 DIAGNOSIS — N183 Chronic kidney disease, stage 3 (moderate): Secondary | ICD-10-CM

## 2016-03-22 DIAGNOSIS — R55 Syncope and collapse: Secondary | ICD-10-CM

## 2016-03-22 DIAGNOSIS — I6200 Nontraumatic subdural hemorrhage, unspecified: Secondary | ICD-10-CM

## 2016-03-22 LAB — GLUCOSE, CAPILLARY
GLUCOSE-CAPILLARY: 146 mg/dL — AB (ref 65–99)
Glucose-Capillary: 121 mg/dL — ABNORMAL HIGH (ref 65–99)
Glucose-Capillary: 130 mg/dL — ABNORMAL HIGH (ref 65–99)
Glucose-Capillary: 133 mg/dL — ABNORMAL HIGH (ref 65–99)
Glucose-Capillary: 168 mg/dL — ABNORMAL HIGH (ref 65–99)

## 2016-03-22 LAB — CBC
HEMATOCRIT: 40.3 % (ref 39.0–52.0)
HEMOGLOBIN: 12.8 g/dL — AB (ref 13.0–17.0)
MCH: 32 pg (ref 26.0–34.0)
MCHC: 31.8 g/dL (ref 30.0–36.0)
MCV: 100.8 fL — AB (ref 78.0–100.0)
Platelets: 79 10*3/uL — ABNORMAL LOW (ref 150–400)
RBC: 4 MIL/uL — AB (ref 4.22–5.81)
RDW: 15.5 % (ref 11.5–15.5)
WBC: 5.9 10*3/uL (ref 4.0–10.5)

## 2016-03-22 LAB — ECHOCARDIOGRAM COMPLETE
HEIGHTINCHES: 72 in
Weight: 4261.05 oz

## 2016-03-22 LAB — BASIC METABOLIC PANEL
ANION GAP: 4 — AB (ref 5–15)
BUN: 21 mg/dL — ABNORMAL HIGH (ref 6–20)
CO2: 28 mmol/L (ref 22–32)
Calcium: 9.1 mg/dL (ref 8.9–10.3)
Chloride: 108 mmol/L (ref 101–111)
Creatinine, Ser: 1.78 mg/dL — ABNORMAL HIGH (ref 0.61–1.24)
GFR calc Af Amer: 40 mL/min — ABNORMAL LOW (ref >60)
GFR calc non Af Amer: 35 mL/min — ABNORMAL LOW (ref >60)
GLUCOSE: 141 mg/dL — AB (ref 65–99)
POTASSIUM: 4.2 mmol/L (ref 3.5–5.1)
Sodium: 140 mmol/L (ref 135–145)

## 2016-03-22 MED ORDER — INSULIN ASPART 100 UNIT/ML ~~LOC~~ SOLN: 0.0000 [IU] | Freq: Every day | SUBCUTANEOUS | Status: DC

## 2016-03-22 MED ORDER — INSULIN ASPART 100 UNIT/ML ~~LOC~~ SOLN
0.0000 [IU] | Freq: Three times a day (TID) | SUBCUTANEOUS | Status: DC
  Administered 2016-03-22 – 2016-03-23 (×3): 1 [IU] via SUBCUTANEOUS

## 2016-03-22 MED ORDER — SODIUM CHLORIDE 0.9 % IV SOLN
INTRAVENOUS | Status: DC
  Administered 2016-03-22 – 2016-03-23 (×2): via INTRAVENOUS

## 2016-03-22 NOTE — Progress Notes (Signed)
Subjective: Patient reports doing well  Objective: Vital signs in last 24 hours: Temp:  [97.5 F (36.4 C)-98.6 F (37 C)] 98.6 F (37 C) (09/02 0700) Pulse Rate:  [54-79] 69 (09/02 0802) Resp:  [16-22] 20 (09/02 0802) BP: (107-159)/(57-118) 145/84 (09/02 0802) SpO2:  [90 %-97 %] 96 % (09/02 0802) Weight:  [120.8 kg (266 lb 5.1 oz)] 120.8 kg (266 lb 5.1 oz) (09/01 1800)  Intake/Output from previous day: 09/01 0701 - 09/02 0700 In: 237.5 [I.V.:237.5] Out: 675 [Urine:675] Intake/Output this shift: Total I/O In: 120 [P.O.:120] Out: 250 [Urine:250]  Physical Exam: No drift.  Lacerations stable.  Lab Results:  Recent Labs  03/21/16 1300 03/22/16 0758  WBC 6.6 5.9  HGB 13.6 12.8*  HCT 41.2 40.3  PLT 81* 79*   BMET  Recent Labs  03/21/16 1300 03/22/16 0758  NA 141 140  K 4.4 4.2  CL 106 108  CO2 27 28  GLUCOSE 124* 141*  BUN 27* 21*  CREATININE 1.89* 1.78*  CALCIUM 9.5 9.1    Studies/Results: Ct Head Wo Contrast  Result Date: 03/22/2016 CLINICAL DATA:  Follow-up exam for acute subdural hematoma. EXAM: CT HEAD WITHOUT CONTRAST TECHNIQUE: Contiguous axial images were obtained from the base of the skull through the vertex without intravenous contrast. COMPARISON:  Prior CT from 03/21/2016. FINDINGS: Brain: Right holo hemispheric subdural hematoma again seen. Overall size of the hematoma is not significantly changed measuring up to 11 mm in maximal thickness at the right parietal convexity. Slight extension along the posterior falx. Extension along the tentorium present as well. Probable trace underlying subarachnoid hemorrhage noted within the right parieto-occipital region. Small left subdural hematoma present as well, most evident over the left frontal convexity were measures up to 4 mm in maximal thickness. This is grossly similar to previous as well. No other acute intracranial hemorrhage. No evidence for acute large vessel territory infarct. No mass effect or midline  shift. No hydrocephalus. Stable atrophy with chronic microvascular ischemic disease. Small left periorbital scalp contusion noted. Scalp soft tissues otherwise unremarkable. No acute abnormality about the globes and orbits. Scattered mucosal thickening throughout the paranasal sinuses. Right frontal sinus partially opacified. Possible nasal bone fracture again noted. No mastoid effusion. Calvarium intact. IMPRESSION: 1. No significant interval change in size of right subdural hematoma measuring up to 11 mm in maximal thickness. No significant mass effect or midline shift. 2. Smaller acute left subdural hematoma measuring up to 4 mm in maximal thickness without significant mass effect. This is unchanged from prior study. 3. No other acute intracranial process identified. Electronically Signed   By: Jeannine Boga M.D.   On: 03/22/2016 06:57   Ct Head Wo Contrast  Result Date: 03/21/2016 CLINICAL DATA:  Fall today. Left facial abrasions. History of colon, long and bladder cancer. EXAM: CT HEAD WITHOUT CONTRAST CT MAXILLOFACIAL WITHOUT CONTRAST CT CERVICAL SPINE WITHOUT CONTRAST TECHNIQUE: Multidetector CT imaging of the head, cervical spine, and maxillofacial structures were performed using the standard protocol without intravenous contrast. Multiplanar CT image reconstructions of the cervical spine and maxillofacial structures were also generated. COMPARISON:  PET-CT 10/03/2013. FINDINGS: CT HEAD FINDINGS Brain: There is a right hemispheric subdural hematoma layering over the frontal, parietal and temporal lobes. This measures up to 11 mm in thickness over the posterior parietal region. There is no resulting midline shift, intraparenchymal hematoma or hydrocephalus. There is no evidence of acute stroke, brain edema or mass lesion. Vascular: Intracranial vascular calcifications are present. Skull: Negative for fracture or focal  lesion. Sinuses/Orbits: See below Other: None. CT MAXILLOFACIAL FINDINGS Mild  irregularity of the nasal bones, potentially nondisplaced acute fractures. No other evidence of acute maxillofacial fracture. There is some mucosal thickening in the right frontal and left maxillary sinuses. No air-fluid levels are identified. The mastoid air cells and middle ears are clear. TMJ degenerative changes are present bilaterally. There is mild left supraorbital soft tissue swelling. No evidence of orbital hematoma. The globes are intact. The optic nerves and extraocular muscles appear normal. CT CERVICAL SPINE FINDINGS The alignment is normal. There is no evidence of acute fracture or traumatic subluxation. There is mild spondylosis, primarily at C1-2. No acute soft tissue findings or high-grade spinal stenosis demonstrated. Carotid arterial calcifications are present bilaterally. There is also atherosclerosis of the aortic arch. IMPRESSION: 1. Right hemispheric subdural hematoma as described. No resulting midline shift or hydrocephalus. 2. Possible nondisplaced nasal bone fractures. No other acute maxillofacial findings. 3. No evidence of acute cervical spine fracture, traumatic subluxation or static signs of instability. 4. Critical Value/emergent results were called by telephone at the time of interpretation on 03/21/2016 at 11:20 am to Dr. Fredia Sorrow , who verbally acknowledged these results. Electronically Signed   By: Richardean Sale M.D.   On: 03/21/2016 11:24   Ct Cervical Spine Wo Contrast  Result Date: 03/21/2016 CLINICAL DATA:  Fall today. Left facial abrasions. History of colon, long and bladder cancer. EXAM: CT HEAD WITHOUT CONTRAST CT MAXILLOFACIAL WITHOUT CONTRAST CT CERVICAL SPINE WITHOUT CONTRAST TECHNIQUE: Multidetector CT imaging of the head, cervical spine, and maxillofacial structures were performed using the standard protocol without intravenous contrast. Multiplanar CT image reconstructions of the cervical spine and maxillofacial structures were also generated. COMPARISON:   PET-CT 10/03/2013. FINDINGS: CT HEAD FINDINGS Brain: There is a right hemispheric subdural hematoma layering over the frontal, parietal and temporal lobes. This measures up to 11 mm in thickness over the posterior parietal region. There is no resulting midline shift, intraparenchymal hematoma or hydrocephalus. There is no evidence of acute stroke, brain edema or mass lesion. Vascular: Intracranial vascular calcifications are present. Skull: Negative for fracture or focal lesion. Sinuses/Orbits: See below Other: None. CT MAXILLOFACIAL FINDINGS Mild irregularity of the nasal bones, potentially nondisplaced acute fractures. No other evidence of acute maxillofacial fracture. There is some mucosal thickening in the right frontal and left maxillary sinuses. No air-fluid levels are identified. The mastoid air cells and middle ears are clear. TMJ degenerative changes are present bilaterally. There is mild left supraorbital soft tissue swelling. No evidence of orbital hematoma. The globes are intact. The optic nerves and extraocular muscles appear normal. CT CERVICAL SPINE FINDINGS The alignment is normal. There is no evidence of acute fracture or traumatic subluxation. There is mild spondylosis, primarily at C1-2. No acute soft tissue findings or high-grade spinal stenosis demonstrated. Carotid arterial calcifications are present bilaterally. There is also atherosclerosis of the aortic arch. IMPRESSION: 1. Right hemispheric subdural hematoma as described. No resulting midline shift or hydrocephalus. 2. Possible nondisplaced nasal bone fractures. No other acute maxillofacial findings. 3. No evidence of acute cervical spine fracture, traumatic subluxation or static signs of instability. 4. Critical Value/emergent results were called by telephone at the time of interpretation on 03/21/2016 at 11:20 am to Dr. Fredia Sorrow , who verbally acknowledged these results. Electronically Signed   By: Richardean Sale M.D.   On:  03/21/2016 11:24   Dg Hand Complete Left  Result Date: 03/21/2016 CLINICAL DATA:  Pain following fall EXAM: LEFT HAND - COMPLETE  3+ VIEW COMPARISON:  Left thumb May 28, 2012 FINDINGS: Frontal, oblique, and lateral views were obtained. There is no demonstrable fracture or dislocation. There is mild osteoarthritic change in the first carpal -metacarpal and first IP joints. No erosive changes. No radiopaque foreign body or soft tissue air. IMPRESSION: Areas of mild osteoarthritic change laterally. No fracture or dislocation. No radiopaque foreign body or soft tissue air. Electronically Signed   By: Lowella Grip III M.D.   On: 03/21/2016 10:20   Ct Maxillofacial Wo Cm  Result Date: 03/21/2016 CLINICAL DATA:  Fall today. Left facial abrasions. History of colon, long and bladder cancer. EXAM: CT HEAD WITHOUT CONTRAST CT MAXILLOFACIAL WITHOUT CONTRAST CT CERVICAL SPINE WITHOUT CONTRAST TECHNIQUE: Multidetector CT imaging of the head, cervical spine, and maxillofacial structures were performed using the standard protocol without intravenous contrast. Multiplanar CT image reconstructions of the cervical spine and maxillofacial structures were also generated. COMPARISON:  PET-CT 10/03/2013. FINDINGS: CT HEAD FINDINGS Brain: There is a right hemispheric subdural hematoma layering over the frontal, parietal and temporal lobes. This measures up to 11 mm in thickness over the posterior parietal region. There is no resulting midline shift, intraparenchymal hematoma or hydrocephalus. There is no evidence of acute stroke, brain edema or mass lesion. Vascular: Intracranial vascular calcifications are present. Skull: Negative for fracture or focal lesion. Sinuses/Orbits: See below Other: None. CT MAXILLOFACIAL FINDINGS Mild irregularity of the nasal bones, potentially nondisplaced acute fractures. No other evidence of acute maxillofacial fracture. There is some mucosal thickening in the right frontal and left maxillary  sinuses. No air-fluid levels are identified. The mastoid air cells and middle ears are clear. TMJ degenerative changes are present bilaterally. There is mild left supraorbital soft tissue swelling. No evidence of orbital hematoma. The globes are intact. The optic nerves and extraocular muscles appear normal. CT CERVICAL SPINE FINDINGS The alignment is normal. There is no evidence of acute fracture or traumatic subluxation. There is mild spondylosis, primarily at C1-2. No acute soft tissue findings or high-grade spinal stenosis demonstrated. Carotid arterial calcifications are present bilaterally. There is also atherosclerosis of the aortic arch. IMPRESSION: 1. Right hemispheric subdural hematoma as described. No resulting midline shift or hydrocephalus. 2. Possible nondisplaced nasal bone fractures. No other acute maxillofacial findings. 3. No evidence of acute cervical spine fracture, traumatic subluxation or static signs of instability. 4. Critical Value/emergent results were called by telephone at the time of interpretation on 03/21/2016 at 11:20 am to Dr. Fredia Sorrow , who verbally acknowledged these results. Electronically Signed   By: Richardean Sale M.D.   On: 03/21/2016 11:24    Assessment/Plan: Head CT stable today.  Mobilize with PT.  D/C home when OK with Hospitalist Service.  Will need F/U in office with me in 3 - 4 weeks with repeat Head CT.    LOS: 1 day    Peggyann Shoals, MD 03/22/2016, 9:51 AM

## 2016-03-22 NOTE — Evaluation (Signed)
Physical Therapy Evaluation Patient Details Name: Kyle [name] MRN: [id number] DOB: Jul 23, 1933 Today's Date: 03/22/2016   History of Present Illness  82 y.o. male withhx of fall tendency, ITP with average platelet count of about 80K, on ASA '81mg'$  per day, hx of HTN, DM, CKD, obesity, hx of lung CA, bladder CA presents S/P Fall with Subdural hematoma  Clinical Impression  Patient demonstrates deficits in functional mobility as indicated below. Will need continued skilled PT to address deficits and maximize function. Will see as indicated and progress as tolerated.  OF NOTE: patient extremely HIGH RISK for FALLS and family unsure if they can provide 24/7. May need to consider post acute rehabilitation (ST SNF) if family is not able to provide safe environment and assist.     Follow Up Recommendations Home health PT;Supervision/Assistance - 24 hour (HIGH FALL RISK, if 24/7 not available, will need SNF)    Equipment Recommendations  None recommended by PT    Recommendations for Other Services       Precautions / Restrictions Precautions Precautions: Fall Precaution Comments: HIGH FALL RISK Restrictions Weight Bearing Restrictions: No      Mobility  Bed Mobility Overal bed mobility: Needs Assistance Bed Mobility: Rolling;Sidelying to Sit Rolling: Min guard Sidelying to sit: Min assist       General bed mobility comments: Min assist to elevate trunk to EOB  Transfers Overall transfer level: Needs assistance Equipment used: Rolling walker (2 wheeled) Transfers: Sit to/from Stand Sit to Stand: Min assist         General transfer comment: Min assist for stability  Ambulation/Gait Ambulation/Gait assistance: Min assist (Moderate assist without device) Ambulation Distance (Feet): 160 Feet (100 ft without device moderate assist) Assistive device: Rolling walker (2 wheeled) Gait Pattern/deviations: Step-through pattern;Decreased stride length;Shuffle;Staggering left;Drifts  right/left;Trunk flexed Gait velocity: decreased Gait velocity interpretation: Below normal speed for age/gender General Gait Details: patient with increased instability with ambulation, multiple balance checks despire physical assist. increased lateral sway with poor ability to maintain positioning within RW. High fall risk  Stairs            Wheelchair Mobility    Modified Rankin (Stroke Patients Only) Modified Rankin (Stroke Patients Only) Pre-Morbid Rankin Score: No symptoms Modified Rankin: Moderately severe disability     Balance Overall balance assessment: Needs assistance;History of Falls   Sitting balance-Leahy Scale: Good Sitting balance - Comments: able to sit at EOB self supported   Standing balance support: No upper extremity supported Standing balance-Leahy Scale: Poor Standing balance comment: reliance on assist for stability in static standing, increased sway noted                             Pertinent Vitals/Pain Pain Assessment: 0-10 Pain Score: 2  Pain Location: head and face Pain Descriptors / Indicators: Discomfort Pain Intervention(s): Monitored during session    Home Living Family/patient expects to be discharged to:: Private residence Living Arrangements: Spouse/significant other Available Help at Discharge: Family;Available PRN/intermittently Type of Home: House Home Access: Stairs to enter Entrance Stairs-Rails: Can reach both Entrance Stairs-Number of Steps: 2 Home Layout: One level Home Equipment: Walker - 2 wheels;Cane - single point;Bedside commode      Prior Function Level of Independence: Independent;Independent with assistive device(s)         Comments: ocassional use of assistive device     Hand Dominance   Dominant Hand: Right    Extremity/Trunk Assessment   Upper Extremity  Assessment: Overall WFL for tasks assessed (noted UE tremor)           Lower Extremity Assessment: Generalized weakness          Communication      Cognition Arousal/Alertness: Awake/alert Behavior During Therapy: Flat affect Overall Cognitive Status: Within Functional Limits for tasks assessed                      General Comments General comments (skin integrity, edema, etc.): facial brusing noted    Exercises        Assessment/Plan    PT Assessment Patient needs continued PT services  PT Diagnosis Difficulty walking;Abnormality of gait;Acute pain   PT Problem List Decreased strength;Decreased activity tolerance;Decreased balance;Decreased mobility;Decreased coordination;Decreased safety awareness;Pain  PT Treatment Interventions DME instruction;Gait training;Stair training;Functional mobility training;Therapeutic activities;Therapeutic exercise;Balance training;Patient/family education   PT Goals (Current goals can be found in the Care Plan section) Acute Rehab PT Goals Patient Stated Goal: to go home PT Goal Formulation: With patient/family Time For Goal Achievement: 04/05/16 Potential to Achieve Goals: Good    Frequency Min 4X/week   Barriers to discharge Decreased caregiver support family may not be able to provide 24/7 supervision/assisty    Co-evaluation               End of Session Equipment Utilized During Treatment: Gait belt Activity Tolerance: Patient tolerated treatment well Patient left: in bed;with call bell/phone within reach (sitting EOB nsg aware) Nurse Communication: Mobility status         Time: 1050-1116 PT Time Calculation (min) (ACUTE ONLY): 26 min   Charges:   PT Evaluation $PT Eval Moderate Complexity: 1 Procedure PT Treatments $Gait Training: 8-22 mins   PT G CodesDuncan Dull 04/03/16, 11:24 AM  Alben Deeds, PT DPT  [phone]

## 2016-03-22 NOTE — Progress Notes (Signed)
[facility]                                                                              Patient Demographics  Kyle [name], is a 82 y.o. male, DOB - [birth year], IWP:[id number]  Admit date - 03/21/2016   Admitting Physician Kyle Falconer, MD  Outpatient Primary MD for the patient is Kyle Hillier, MD  Outpatient specialists:   LOS - 1  days    Chief Complaint  Patient presents with  . Fall       Brief summary   Per admission note by Dr. Orvan [name] on 9/1 Kyle [name] is an 82 y.o. male withhx of fall tendency, ITP with average platelet count of about 80K, on ASA '81mg'$  per day, hx of HTN, DM, CKD, obesity, hx of lung CA, bladder CA, fell today without LOC.  Evalaution in the ER included a head CT showing a 10m subdural hematoma, layering out over the parietal lobe, without shift or mass effect. EDP consulted Dr Kyle Limberof neurosurgery who felt that he unlikely to need any surgical intervention, and recommended that [name].  If no progression, he recommneded a 2 week OP follow with with a head CT prior to the appointment.  Patient also has abrasion over his left face, and was given a Td shot.  He has a upper lip laceration, with CT of the maxillofacial bone showing possible non displaced fx of the nasal bone.    Assessment & Plan    Principal Problem:   Subdural hematoma, post-traumatic (HCC) - Due to mechanical fall. Repeat CT head this morning showed no significant interval change in the size of the right subdural hematoma measuring up to 11 mm in the maximal thickness, small acute left subdural hematoma measuring up to 4 mm in the maximal thickness, no significant mass effect. - Continue to hold aspirin -Cleared from a neurosurgical standpoint by Dr. SVertell [name]  Active Problems: Multiple falls, dizziness -Per patient feels dizziness on bending down to pick up something, has balance issues, no vertigo   - BP somewhat  low, check orthostatic vitals, 2-D echocardiogram, baseline EKG -Orthostatic vitals positive on standing BP fell to 110/72, pulse increased from 75 (lying) ->90  - at the time of my examination, BP in the low 90s, discontinued lisinopril - Obtain vitamin B12, folate, cortisol level - BP does not need to be so stringent with patient's age and orthostasis, high fall risk, okay to have systolic BP in 1505L 1976B- Place on gentle hydration    Hypertension - BP currently low, discontinued ACE inhibitor    CKD (chronic kidney disease) stage 3, GFR 30-59 ml/min - Creatinine currently at baseline status    Diabetes (HCC) - Continue sliding scale insulin   Code Status: Full CODE STATUS DVT Prophylaxis:  SCD's Family Communication: Discussed in detail with the patient, all imaging results, lab results explained to the patient, son and wife   Disposition Plan:  Time Spent in minutes  25 minutes  Procedures:  CT head  Consultants:   Neuro surgery  Antimicrobials:  Medications  Scheduled Meds: . citalopram  20 mg Oral Daily  . insulin aspart  0-5 Units Subcutaneous QHS  . insulin aspart  0-9 Units Subcutaneous TID WC  . lidocaine-EPINEPHrine  10 mL Infiltration Once  . loratadine  10 mg Oral Daily  . ondansetron  4 mg Intravenous Once  . pravastatin  80 mg Oral Daily  . tiotropium  18 mcg Inhalation Daily   Continuous Infusions:  PRN Meds:.acetaminophen, albuterol, ALPRAZolam, fentaNYL (SUBLIMAZE) injection, ondansetron (ZOFRAN) IV, prochlorperazine   Antibiotics   Anti-infectives    None        Subjective:   Kyle [name] was seen and examined today. No specific complaints. Patient denies chest pain, shortness of breath, abdominal pain, N/V/D/C, new weakness, numbess, tingling. No acute events overnight.    Objective:   Vitals:   03/22/16 1041 03/22/16 1044 03/22/16 1047 03/22/16 1050  BP: 125/71 (!) 148/64 110/72 119/77  Pulse: 75 89 95 90  Resp: 19 (!)  21 (!) 25 20  Temp:      TempSrc:      SpO2: 94% 95% 93% 97%  Weight:      Height:        Intake/Output Summary (Last 24 hours) at 03/22/16 1148 Last data filed at 03/22/16 0802  Gross per 24 hour  Intake            357.5 ml  Output              925 ml  Net           -567.5 ml     Wt Readings from Last 3 Encounters:  03/21/16 120.8 kg (266 lb 5.1 oz)  03/20/16 121.7 kg (268 lb 3.2 oz)  02/22/16 122.5 kg (270 lb)     Exam  General: Alert and oriented x 3, NAD  HEENT:  PERRLA, EOMI, Anicteric Sclera, Bruising on the face   Neck: Supple, no JVD, no masses  Cardiovascular: S1 S2 auscultated, no rubs, murmurs or gallops. Regular rate and rhythm.  Respiratory: Clear to auscultation bilaterally, no wheezing, rales or rhonchi  Gastrointestinal: Soft, nontender, nondistended, + bowel sounds  Ext: no cyanosis clubbing or edema  Neuro: AAOx3, Cr N's II- XII. Strength 5/5 upper and lower extremities bilaterally  Skin: No rashes  Psych: Normal affect and demeanor, alert and oriented x3    Data Reviewed:  I have personally reviewed following labs and imaging studies  Micro Results Recent Results (from the past 240 hour(s))  MRSA PCR Screening     Status: None   Collection Time: 03/21/16  6:07 PM  Result Value Ref Range Status   MRSA by PCR NEGATIVE NEGATIVE Final    Comment:        The GeneXpert MRSA Assay (FDA approved for NASAL specimens only), is one component of a comprehensive MRSA colonization surveillance program. It is not intended to diagnose MRSA infection nor to guide or monitor treatment for MRSA infections.     Radiology Reports Ct Head Wo Contrast  Result Date: 03/22/2016 CLINICAL DATA:  Follow-up exam for acute subdural hematoma. EXAM: CT HEAD WITHOUT CONTRAST TECHNIQUE: Contiguous axial images were obtained from the base of the skull through the vertex without intravenous contrast. COMPARISON:  Prior CT from 03/21/2016. FINDINGS: Brain: Right  holo hemispheric subdural hematoma again seen. Overall size of the hematoma is not significantly changed measuring up to 11 mm in maximal thickness at the right parietal convexity. Slight extension along the posterior falx. Extension along  the tentorium present as well. Probable trace underlying subarachnoid hemorrhage noted within the right parieto-occipital region. Small left subdural hematoma present as well, most evident over the left frontal convexity were measures up to 4 mm in maximal thickness. This is grossly similar to previous as well. No other acute intracranial hemorrhage. No evidence for acute large vessel territory infarct. No mass effect or midline shift. No hydrocephalus. Stable atrophy with chronic microvascular ischemic disease. Small left periorbital scalp contusion noted. Scalp soft tissues otherwise unremarkable. No acute abnormality about the globes and orbits. Scattered mucosal thickening throughout the paranasal sinuses. Right frontal sinus partially opacified. Possible nasal bone fracture again noted. No mastoid effusion. Calvarium intact. IMPRESSION: 1. No significant interval change in size of right subdural hematoma measuring up to 11 mm in maximal thickness. No significant mass effect or midline shift. 2. Smaller acute left subdural hematoma measuring up to 4 mm in maximal thickness without significant mass effect. This is unchanged from prior study. 3. No other acute intracranial process identified. Electronically Signed   By: Jeannine Boga M.D.   On: 03/22/2016 06:57   Ct Head Wo Contrast  Result Date: 03/21/2016 CLINICAL DATA:  Fall today. Left facial abrasions. History of colon, long and bladder cancer. EXAM: CT HEAD WITHOUT CONTRAST CT MAXILLOFACIAL WITHOUT CONTRAST CT CERVICAL SPINE WITHOUT CONTRAST TECHNIQUE: Multidetector CT imaging of the head, cervical spine, and maxillofacial structures were performed using the standard protocol without intravenous contrast.  Multiplanar CT image reconstructions of the cervical spine and maxillofacial structures were also generated. COMPARISON:  PET-CT 10/03/2013. FINDINGS: CT HEAD FINDINGS Brain: There is a right hemispheric subdural hematoma layering over the frontal, parietal and temporal lobes. This measures up to 11 mm in thickness over the posterior parietal region. There is no resulting midline shift, intraparenchymal hematoma or hydrocephalus. There is no evidence of acute stroke, brain edema or mass lesion. Vascular: Intracranial vascular calcifications are present. Skull: Negative for fracture or focal lesion. Sinuses/Orbits: See below Other: None. CT MAXILLOFACIAL FINDINGS Mild irregularity of the nasal bones, potentially nondisplaced acute fractures. No other evidence of acute maxillofacial fracture. There is some mucosal thickening in the right frontal and left maxillary sinuses. No air-fluid levels are identified. The mastoid air cells and middle ears are clear. TMJ degenerative changes are present bilaterally. There is mild left supraorbital soft tissue swelling. No evidence of orbital hematoma. The globes are intact. The optic nerves and extraocular muscles appear normal. CT CERVICAL SPINE FINDINGS The alignment is normal. There is no evidence of acute fracture or traumatic subluxation. There is mild spondylosis, primarily at C1-2. No acute soft tissue findings or high-grade spinal stenosis demonstrated. Carotid arterial calcifications are present bilaterally. There is also atherosclerosis of the aortic arch. IMPRESSION: 1. Right hemispheric subdural hematoma as described. No resulting midline shift or hydrocephalus. 2. Possible nondisplaced nasal bone fractures. No other acute maxillofacial findings. 3. No evidence of acute cervical spine fracture, traumatic subluxation or static signs of instability. 4. Critical Value/emergent results were called by telephone at the time of interpretation on 03/21/2016 at 11:20 am to Dr.  Fredia Sorrow , who verbally acknowledged these results. Electronically Signed   By: Richardean Sale M.D.   On: 03/21/2016 11:24   Ct Cervical Spine Wo Contrast  Result Date: 03/21/2016 CLINICAL DATA:  Fall today. Left facial abrasions. History of colon, long and bladder cancer. EXAM: CT HEAD WITHOUT CONTRAST CT MAXILLOFACIAL WITHOUT CONTRAST CT CERVICAL SPINE WITHOUT CONTRAST TECHNIQUE: Multidetector CT imaging of the head, cervical spine,  and maxillofacial structures were performed using the standard protocol without intravenous contrast. Multiplanar CT image reconstructions of the cervical spine and maxillofacial structures were also generated. COMPARISON:  PET-CT 10/03/2013. FINDINGS: CT HEAD FINDINGS Brain: There is a right hemispheric subdural hematoma layering over the frontal, parietal and temporal lobes. This measures up to 11 mm in thickness over the posterior parietal region. There is no resulting midline shift, intraparenchymal hematoma or hydrocephalus. There is no evidence of acute stroke, brain edema or mass lesion. Vascular: Intracranial vascular calcifications are present. Skull: Negative for fracture or focal lesion. Sinuses/Orbits: See below Other: None. CT MAXILLOFACIAL FINDINGS Mild irregularity of the nasal bones, potentially nondisplaced acute fractures. No other evidence of acute maxillofacial fracture. There is some mucosal thickening in the right frontal and left maxillary sinuses. No air-fluid levels are identified. The mastoid air cells and middle ears are clear. TMJ degenerative changes are present bilaterally. There is mild left supraorbital soft tissue swelling. No evidence of orbital hematoma. The globes are intact. The optic nerves and extraocular muscles appear normal. CT CERVICAL SPINE FINDINGS The alignment is normal. There is no evidence of acute fracture or traumatic subluxation. There is mild spondylosis, primarily at C1-2. No acute soft tissue findings or high-grade  spinal stenosis demonstrated. Carotid arterial calcifications are present bilaterally. There is also atherosclerosis of the aortic arch. IMPRESSION: 1. Right hemispheric subdural hematoma as described. No resulting midline shift or hydrocephalus. 2. Possible nondisplaced nasal bone fractures. No other acute maxillofacial findings. 3. No evidence of acute cervical spine fracture, traumatic subluxation or static signs of instability. 4. Critical Value/emergent results were called by telephone at the time of interpretation on 03/21/2016 at 11:20 am to Dr. Fredia Sorrow , who verbally acknowledged these results. Electronically Signed   By: Richardean Sale M.D.   On: 03/21/2016 11:24   Dg Knee Complete 4 Views Left  Result Date: 02/22/2016 CLINICAL DATA:  Pain following fall EXAM: LEFT KNEE - COMPLETE 4+ VIEW COMPARISON:  None. FINDINGS: Frontal, lateral, and bilateral oblique views were obtained. There is no appreciable fracture or dislocation. There is no joint effusion. There are spurs along the anterior patella. There is slight narrowing of the patellofemoral joint. There is also slight narrowing medially. No erosive change. IMPRESSION: Mild osteoarthritic change. No fracture or appreciable joint effusion. Electronically Signed   By: Lowella Grip III M.D.   On: 02/22/2016 11:33   Dg Hand Complete Left  Result Date: 03/21/2016 CLINICAL DATA:  Pain following fall EXAM: LEFT HAND - COMPLETE 3+ VIEW COMPARISON:  Left thumb May 28, 2012 FINDINGS: Frontal, oblique, and lateral views were obtained. There is no demonstrable fracture or dislocation. There is mild osteoarthritic change in the first carpal -metacarpal and first IP joints. No erosive changes. No radiopaque foreign body or soft tissue air. IMPRESSION: Areas of mild osteoarthritic change laterally. No fracture or dislocation. No radiopaque foreign body or soft tissue air. Electronically Signed   By: Lowella Grip III M.D.   On: 03/21/2016 10:20     Ct Maxillofacial Wo Cm  Result Date: 03/21/2016 CLINICAL DATA:  Fall today. Left facial abrasions. History of colon, long and bladder cancer. EXAM: CT HEAD WITHOUT CONTRAST CT MAXILLOFACIAL WITHOUT CONTRAST CT CERVICAL SPINE WITHOUT CONTRAST TECHNIQUE: Multidetector CT imaging of the head, cervical spine, and maxillofacial structures were performed using the standard protocol without intravenous contrast. Multiplanar CT image reconstructions of the cervical spine and maxillofacial structures were also generated. COMPARISON:  PET-CT 10/03/2013. FINDINGS: CT HEAD FINDINGS Brain:  There is a right hemispheric subdural hematoma layering over the frontal, parietal and temporal lobes. This measures up to 11 mm in thickness over the posterior parietal region. There is no resulting midline shift, intraparenchymal hematoma or hydrocephalus. There is no evidence of acute stroke, brain edema or mass lesion. Vascular: Intracranial vascular calcifications are present. Skull: Negative for fracture or focal lesion. Sinuses/Orbits: See below Other: None. CT MAXILLOFACIAL FINDINGS Mild irregularity of the nasal bones, potentially nondisplaced acute fractures. No other evidence of acute maxillofacial fracture. There is some mucosal thickening in the right frontal and left maxillary sinuses. No air-fluid levels are identified. The mastoid air cells and middle ears are clear. TMJ degenerative changes are present bilaterally. There is mild left supraorbital soft tissue swelling. No evidence of orbital hematoma. The globes are intact. The optic nerves and extraocular muscles appear normal. CT CERVICAL SPINE FINDINGS The alignment is normal. There is no evidence of acute fracture or traumatic subluxation. There is mild spondylosis, primarily at C1-2. No acute soft tissue findings or high-grade spinal stenosis demonstrated. Carotid arterial calcifications are present bilaterally. There is also atherosclerosis of the aortic arch.  IMPRESSION: 1. Right hemispheric subdural hematoma as described. No resulting midline shift or hydrocephalus. 2. Possible nondisplaced nasal bone fractures. No other acute maxillofacial findings. 3. No evidence of acute cervical spine fracture, traumatic subluxation or static signs of instability. 4. Critical Value/emergent results were called by telephone at the time of interpretation on 03/21/2016 at 11:20 am to Dr. Fredia Sorrow , who verbally acknowledged these results. Electronically Signed   By: Richardean Sale M.D.   On: 03/21/2016 11:24    Lab Data:  CBC:  Recent Labs Lab 03/21/16 1300 03/22/16 0758  WBC 6.6 5.9  NEUTROABS 4.8  --   HGB 13.6 12.8*  HCT 41.2 40.3  MCV 99.5 100.8*  PLT 81* 79*   Basic Metabolic Panel:  Recent Labs Lab 03/21/16 1300 03/22/16 0758  NA 141 140  K 4.4 4.2  CL 106 108  CO2 27 28  GLUCOSE 124* 141*  BUN 27* 21*  CREATININE 1.89* 1.78*  CALCIUM 9.5 9.1   GFR: Estimated Creatinine Clearance: 45.2 mL/min (by C-G formula based on SCr of 1.78 mg/dL). Liver Function Tests:  Recent Labs Lab 03/21/16 1300  AST 16  ALT 12*  ALKPHOS 50  BILITOT 1.4*  PROT 6.7  ALBUMIN 3.8   No results for input(s): LIPASE, AMYLASE in the last 168 hours. No results for input(s): AMMONIA in the last 168 hours. Coagulation Profile:  Recent Labs Lab 03/21/16 1300  INR 1.07   Cardiac Enzymes: No results for input(s): CKTOTAL, CKMB, CKMBINDEX, TROPONINI in the last 168 hours. BNP (last 3 results) No results for input(s): PROBNP in the last 8760 hours. HbA1C: No results for input(s): HGBA1C in the last 72 hours. CBG:  Recent Labs Lab 03/21/16 2001 03/22/16 0026 03/22/16 0341 03/22/16 0756  GLUCAP 159* 168* 121* 146*   Lipid Profile: No results for input(s): CHOL, HDL, LDLCALC, TRIG, CHOLHDL, LDLDIRECT in the last 72 hours. Thyroid Function Tests: No results for input(s): TSH, T4TOTAL, FREET4, T3FREE, THYROIDAB in the last 72 hours. Anemia  Panel: No results for input(s): VITAMINB12, FOLATE, FERRITIN, TIBC, IRON, RETICCTPCT in the last 72 hours. Urine analysis:    Component Value Date/Time   COLORURINE YELLOW 10/06/2013 1527   APPEARANCEUR CLOUDY (A) 10/06/2013 1527   LABSPEC 1.018 10/06/2013 1527   PHURINE 5.5 10/06/2013 1527   GLUCOSEU NEGATIVE 10/06/2013 1527   HGBUR LARGE (  A) 10/06/2013 1527   BILIRUBINUR NEGATIVE 10/06/2013 1527   KETONESUR NEGATIVE 10/06/2013 1527   PROTEINUR 100 (A) 10/06/2013 1527   UROBILINOGEN 1.0 10/06/2013 1527   NITRITE NEGATIVE 10/06/2013 1527   LEUKOCYTESUR MODERATE (A) 10/06/2013 1527     RAI,RIPUDEEP M.D. [facility] 03/22/2016, 11:48 AM  Pager: [phone] Between 7am to 7pm - call Pager - [phone]  After 7pm go to [email or web address] - password [email or web address]  Call night coverage person covering after 7pm

## 2016-03-22 NOTE — Progress Notes (Signed)
Report called to RN. Pt to transfer to 5W24 with belongings with family via w/c

## 2016-03-22 NOTE — Progress Notes (Signed)
Pt c/o nasal congestion and requesting nasal spray. Paged Dr Tana Coast. Waiting to hear back from dr.

## 2016-03-22 NOTE — Progress Notes (Signed)
Pt transferred to 5W-24 in stable condition. Cardiac monitor box 29 placed on pt. Ginger, charge nurse assessed skin with nurse. Pt c/o headache and anxiety. Will administer tylenol and xanax when available. Son is at bedside. Will continue to monitor pt.

## 2016-03-22 NOTE — Progress Notes (Signed)
  Echocardiogram 2D Echocardiogram has been performed.  Darlina Sicilian M 03/22/2016, 1:06 PM

## 2016-03-23 DIAGNOSIS — E119 Type 2 diabetes mellitus without complications: Secondary | ICD-10-CM

## 2016-03-23 DIAGNOSIS — S065X0D Traumatic subdural hemorrhage without loss of consciousness, subsequent encounter: Secondary | ICD-10-CM

## 2016-03-23 LAB — HEMOGLOBIN A1C
Hgb A1c MFr Bld: 5.7 % — ABNORMAL HIGH (ref 4.8–5.6)
MEAN PLASMA GLUCOSE: 117 mg/dL

## 2016-03-23 LAB — BASIC METABOLIC PANEL
ANION GAP: 6 (ref 5–15)
BUN: 21 mg/dL — ABNORMAL HIGH (ref 6–20)
CALCIUM: 8.8 mg/dL — AB (ref 8.9–10.3)
CO2: 25 mmol/L (ref 22–32)
CREATININE: 1.77 mg/dL — AB (ref 0.61–1.24)
Chloride: 112 mmol/L — ABNORMAL HIGH (ref 101–111)
GFR, EST AFRICAN AMERICAN: 40 mL/min — AB (ref >60)
GFR, EST NON AFRICAN AMERICAN: 35 mL/min — AB (ref >60)
Glucose, Bld: 115 mg/dL — ABNORMAL HIGH (ref 65–99)
Potassium: 4 mmol/L (ref 3.5–5.1)
Sodium: 143 mmol/L (ref 135–145)

## 2016-03-23 LAB — GLUCOSE, CAPILLARY
GLUCOSE-CAPILLARY: 106 mg/dL — AB (ref 65–99)
Glucose-Capillary: 144 mg/dL — ABNORMAL HIGH (ref 65–99)
Glucose-Capillary: 87 mg/dL (ref 65–99)
Glucose-Capillary: 129 mg/dL — ABNORMAL HIGH (ref 65–99)

## 2016-03-23 LAB — VITAMIN B12: VITAMIN B 12: 208 pg/mL (ref 180–914)

## 2016-03-23 LAB — CORTISOL: CORTISOL PLASMA: 14.3 ug/dL

## 2016-03-23 LAB — FOLATE: FOLATE: 7.3 ng/mL (ref >5.9)

## 2016-03-23 MED ORDER — FLUTICASONE PROPIONATE 50 MCG/ACT NA SUSP
1.0000 | Freq: Every day | NASAL | Status: DC
  Administered 2016-03-23 – 2016-03-24 (×2): 1 via NASAL
  Filled 2016-03-23: qty 16

## 2016-03-23 MED ORDER — VITAMIN B-12 1000 MCG PO TABS
1000.0000 ug | ORAL_TABLET | Freq: Every day | ORAL | Status: DC
  Administered 2016-03-23 – 2016-03-24 (×2): 1000 ug via ORAL
  Filled 2016-03-23 (×2): qty 1

## 2016-03-23 NOTE — Progress Notes (Signed)
[facility]                                                                              Patient Demographics  Kyle [name], is a 82 y.o. male, DOB - 11-Feb-1934, BEM:[id number]  Admit date - 03/21/2016   Admitting Physician Orvan Falconer, MD  Outpatient Primary MD for the patient is Mickie Hillier, MD  Outpatient specialists:   LOS - 2  days    Chief Complaint  Patient presents with  . Fall       Brief summary   Per admission note by Dr. Orvan Falconer on 9/1 KACI [name] is an 82 y.o. male withhx of fall tendency, ITP with average platelet count of about 80K, on ASA '81mg'$  per day, hx of HTN, DM, CKD, obesity, hx of lung CA, bladder CA, fell today without LOC.  Evalaution in the ER included a head CT showing a 44m subdural hematoma, layering out over the parietal lobe, without shift or mass effect. EDP consulted Dr SVertell Limberof neurosurgery who felt that he unlikely to need any surgical intervention, and [facility].  If no progression, he recommneded a 2 week OP follow with with a head CT prior to the appointment.  Patient also has abrasion over his left face, and was given a Td shot.  He has a upper lip laceration, with CT of the maxillofacial bone showing possible non displaced fx of the nasal bone.    Assessment & Plan    Principal Problem:   Subdural hematoma, post-traumatic (HCC) - Due to mechanical fall. Repeat CT head this morning showed no significant interval change in the size of the right subdural hematoma measuring up to 11 mm in the maximal thickness, small acute left subdural hematoma measuring up to 4 mm in the maximal thickness, no significant mass effect. - Continue to hold aspirin -Cleared from a neurosurgical standpoint by Dr. SVertell Limber  Active Problems: Multiple falls, dizziness, Orthostatic hypotension -Per patient feels dizziness on bending down to pick up something, has balance issues, no  vertigo   - BP somewhat low, positive orthostatic vitals, patient was placed on gentle hydration - 2-D echo showed EF of 50-55% with no critical valvular disorders - Discontinued lisinopril - Folate, cortisol level normal, B12 on the lower end of normal, placed on B12 replacement - BP does not need to be so stringent with patient's age and orthostasis, high fall risk, okay to have systolic BP in [id number]- PT evaluation and treatment at skilled nursing facility.    Hypertension - BP currently low, discontinued ACE inhibitor    CKD (chronic kidney disease) stage 3, GFR 30-59 ml/min - Creatinine currently at baseline status    Diabetes (HCC) - Continue sliding scale insulin   Code Status: Full CODE STATUS DVT Prophylaxis:  SCD's Family Communication: Discussed in detail with the patient, all imaging results, lab results explained to the patient, son and wife. The patient's family leaning towards rehabilitation before coming home. Social worker consult placed   Disposition Plan:  Time Spent in minutes  25 minutes  Procedures:  CT head  Consultants:   Neuro surgery  Antimicrobials:      Medications  Scheduled Meds: . citalopram  20 mg Oral Daily  . fluticasone  1 spray Each Nare Daily  . insulin aspart  0-5 Units Subcutaneous QHS  . insulin aspart  0-9 Units Subcutaneous TID WC  . lidocaine-EPINEPHrine  10 mL Infiltration Once  . loratadine  10 mg Oral Daily  . ondansetron  4 mg Intravenous Once  . pravastatin  80 mg Oral Daily  . tiotropium  18 mcg Inhalation Daily  . vitamin B-12  1,000 mcg Oral Daily   Continuous Infusions: . sodium chloride 75 mL/hr at 03/23/16 0454   PRN Meds:.acetaminophen, albuterol, ALPRAZolam, fentaNYL (SUBLIMAZE) injection, ondansetron (ZOFRAN) IV, prochlorperazine   Antibiotics   Anti-infectives    None        Subjective:   Ausencio Vaden was seen and examined today. Mild headache otherwise no blurry vision, numbness or  tingling or slurred speech. Patient denies chest pain, shortness of breath, abdominal pain, N/V/D/C, new weakness, numbess, tingling. No acute events overnight.    Objective:   Vitals:   03/22/16 1206 03/22/16 1500 03/22/16 2142 03/23/16 0515  BP: (!) 109/49 (!) 113/49 (!) 119/56 (!) 107/57  Pulse: 68 71 71 65  Resp: (!) '21 17 18 20  '$ Temp: 97.9 F (36.6 C) 98.1 F (36.7 C)  97.8 F (36.6 C)  TempSrc: Oral Oral  Oral  SpO2: 95% 93% 96% 94%  Weight:      Height:        Intake/Output Summary (Last 24 hours) at 03/23/16 1112 Last data filed at 03/23/16 0827  Gross per 24 hour  Intake          1810.83 ml  Output             1150 ml  Net           660.83 ml     Wt Readings from Last 3 Encounters:  03/21/16 120.8 kg (266 lb 5.1 oz)  03/20/16 121.7 kg (268 lb 3.2 oz)  02/22/16 122.5 kg (270 lb)     Exam  General: Alert and oriented x 3, NAD  HEENT:  PERRLA, EOMI, Anicteric Sclera, Bruising on the face   Neck:   Cardiovascular: S1 S2clear, RRR  Respiratory: CTAB  Gastrointestinal: Soft, nontender, nondistended, + bowel sounds  Ext: no cyanosis clubbing or edema  Neuro: AAOx3, Cr N's II- XII. Strength 5/5 upper and lower extremities bilaterally  Skin: Bruising on the face and forehead  Psych: Normal affect and demeanor, alert and oriented x3    Data Reviewed:  I have personally reviewed following labs and imaging studies  Micro Results Recent Results (from the past 240 hour(s))  MRSA PCR Screening     Status: None   Collection Time: 03/21/16  6:07 PM  Result Value Ref Range Status   MRSA by PCR NEGATIVE NEGATIVE Final    Comment:        The GeneXpert MRSA Assay (FDA approved for NASAL specimens only), is one component of a comprehensive MRSA colonization surveillance program. It is not intended to diagnose MRSA infection nor to guide or monitor treatment for MRSA infections.     Radiology Reports Ct Head Wo Contrast  Result Date:  03/22/2016 CLINICAL DATA:  Follow-up exam for acute subdural hematoma. EXAM: CT HEAD WITHOUT CONTRAST TECHNIQUE: Contiguous axial images were obtained from the base of the skull through the vertex without intravenous contrast. COMPARISON:  Prior CT from 03/21/2016. FINDINGS: Brain: Right holo hemispheric subdural hematoma again seen. Overall size of the hematoma is not significantly changed measuring up to 11 mm in maximal thickness at the right parietal convexity. Slight extension along the posterior falx. Extension along the tentorium present as well. Probable trace underlying subarachnoid hemorrhage noted within the right parieto-occipital region. Small left subdural hematoma present as well, most evident over the left frontal convexity were measures up to 4 mm in maximal thickness. This is grossly similar to previous as well. No other acute intracranial hemorrhage. No evidence for acute large vessel territory infarct. No mass effect or midline shift. No hydrocephalus. Stable atrophy with chronic microvascular ischemic disease. Small left periorbital scalp contusion noted. Scalp soft tissues otherwise unremarkable. No acute abnormality about the globes and orbits. Scattered mucosal thickening throughout the paranasal sinuses. Right frontal sinus partially opacified. Possible nasal bone fracture again noted. No mastoid effusion. Calvarium intact. IMPRESSION: 1. No significant interval change in size of right subdural hematoma measuring up to 11 mm in maximal thickness. No significant mass effect or midline shift. 2. Smaller acute left subdural hematoma measuring up to 4 mm in maximal thickness without significant mass effect. This is unchanged from prior study. 3. No other acute intracranial process identified. Electronically Signed   By: Jeannine Boga M.D.   On: 03/22/2016 06:57   Ct Head Wo Contrast  Result Date: 03/21/2016 CLINICAL DATA:  Fall today. Left facial abrasions. History of colon, long and  bladder cancer. EXAM: CT HEAD WITHOUT CONTRAST CT MAXILLOFACIAL WITHOUT CONTRAST CT CERVICAL SPINE WITHOUT CONTRAST TECHNIQUE: Multidetector CT imaging of the head, cervical spine, and maxillofacial structures were performed using the standard protocol without intravenous contrast. Multiplanar CT image reconstructions of the cervical spine and maxillofacial structures were also generated. COMPARISON:  PET-CT 10/03/2013. FINDINGS: CT HEAD FINDINGS Brain: There is a right hemispheric subdural hematoma layering over the frontal, parietal and temporal lobes. This measures up to 11 mm in thickness over the posterior parietal region. There is no resulting midline shift, intraparenchymal hematoma or hydrocephalus. There is no evidence of acute stroke, brain edema or mass lesion. Vascular: Intracranial vascular calcifications are present. Skull: Negative for fracture or focal lesion. Sinuses/Orbits: See below Other: None. CT MAXILLOFACIAL FINDINGS Mild irregularity of the nasal bones, potentially nondisplaced acute fractures. No other evidence of acute maxillofacial fracture. There is some mucosal thickening in the right frontal and left maxillary sinuses. No air-fluid levels are identified. The mastoid air cells and middle ears are clear. TMJ degenerative changes are present bilaterally. There is mild left supraorbital soft tissue swelling. No evidence of orbital hematoma. The globes are intact. The optic nerves and extraocular muscles appear normal. CT CERVICAL SPINE FINDINGS The alignment is normal. There is no evidence of acute fracture or traumatic subluxation. There is mild spondylosis, primarily at C1-2. No acute soft tissue findings or high-grade spinal stenosis demonstrated. Carotid arterial calcifications are present bilaterally. There is also atherosclerosis of the aortic arch. IMPRESSION: 1. Right hemispheric subdural hematoma as described. No resulting midline shift or hydrocephalus. 2. Possible nondisplaced  nasal bone fractures. No other acute maxillofacial findings. 3. No evidence of acute cervical spine fracture, traumatic subluxation or static signs of instability. 4. Critical Value/emergent results were called by telephone at the time of interpretation on 03/21/2016 at 11:20 am to Dr. Fredia Sorrow , who verbally acknowledged these results. Electronically Signed   By: Richardean Sale M.D.   On: 03/21/2016 11:24   Ct Cervical Spine Wo  Contrast  Result Date: 03/21/2016 CLINICAL DATA:  Fall today. Left facial abrasions. History of colon, long and bladder cancer. EXAM: CT HEAD WITHOUT CONTRAST CT MAXILLOFACIAL WITHOUT CONTRAST CT CERVICAL SPINE WITHOUT CONTRAST TECHNIQUE: Multidetector CT imaging of the head, cervical spine, and maxillofacial structures were performed using the standard protocol without intravenous contrast. Multiplanar CT image reconstructions of the cervical spine and maxillofacial structures were also generated. COMPARISON:  PET-CT 10/03/2013. FINDINGS: CT HEAD FINDINGS Brain: There is a right hemispheric subdural hematoma layering over the frontal, parietal and temporal lobes. This measures up to 11 mm in thickness over the posterior parietal region. There is no resulting midline shift, intraparenchymal hematoma or hydrocephalus. There is no evidence of acute stroke, brain edema or mass lesion. Vascular: Intracranial vascular calcifications are present. Skull: Negative for fracture or focal lesion. Sinuses/Orbits: See below Other: None. CT MAXILLOFACIAL FINDINGS Mild irregularity of the nasal bones, potentially nondisplaced acute fractures. No other evidence of acute maxillofacial fracture. There is some mucosal thickening in the right frontal and left maxillary sinuses. No air-fluid levels are identified. The mastoid air cells and middle ears are clear. TMJ degenerative changes are present bilaterally. There is mild left supraorbital soft tissue swelling. No evidence of orbital hematoma. The  globes are intact. The optic nerves and extraocular muscles appear normal. CT CERVICAL SPINE FINDINGS The alignment is normal. There is no evidence of acute fracture or traumatic subluxation. There is mild spondylosis, primarily at C1-2. No acute soft tissue findings or high-grade spinal stenosis demonstrated. Carotid arterial calcifications are present bilaterally. There is also atherosclerosis of the aortic arch. IMPRESSION: 1. Right hemispheric subdural hematoma as described. No resulting midline shift or hydrocephalus. 2. Possible nondisplaced nasal bone fractures. No other acute maxillofacial findings. 3. No evidence of acute cervical spine fracture, traumatic subluxation or static signs of instability. 4. Critical Value/emergent results were called by telephone at the time of interpretation on 03/21/2016 at 11:20 am to Dr. Fredia Sorrow , who verbally acknowledged these results. Electronically Signed   By: Richardean Sale M.D.   On: 03/21/2016 11:24   Dg Hand Complete Left  Result Date: 03/21/2016 CLINICAL DATA:  Pain following fall EXAM: LEFT HAND - COMPLETE 3+ VIEW COMPARISON:  Left thumb May 28, 2012 FINDINGS: Frontal, oblique, and lateral views were obtained. There is no demonstrable fracture or dislocation. There is mild osteoarthritic change in the first carpal -metacarpal and first IP joints. No erosive changes. No radiopaque foreign body or soft tissue air. IMPRESSION: Areas of mild osteoarthritic change laterally. No fracture or dislocation. No radiopaque foreign body or soft tissue air. Electronically Signed   By: Lowella Grip III M.D.   On: 03/21/2016 10:20   Ct Maxillofacial Wo Cm  Result Date: 03/21/2016 CLINICAL DATA:  Fall today. Left facial abrasions. History of colon, long and bladder cancer. EXAM: CT HEAD WITHOUT CONTRAST CT MAXILLOFACIAL WITHOUT CONTRAST CT CERVICAL SPINE WITHOUT CONTRAST TECHNIQUE: Multidetector CT imaging of the head, cervical spine, and maxillofacial  structures were performed using the standard protocol without intravenous contrast. Multiplanar CT image reconstructions of the cervical spine and maxillofacial structures were also generated. COMPARISON:  PET-CT 10/03/2013. FINDINGS: CT HEAD FINDINGS Brain: There is a right hemispheric subdural hematoma layering over the frontal, parietal and temporal lobes. This measures up to 11 mm in thickness over the posterior parietal region. There is no resulting midline shift, intraparenchymal hematoma or hydrocephalus. There is no evidence of acute stroke, brain edema or mass lesion. Vascular: Intracranial vascular calcifications are present.  Skull: Negative for fracture or focal lesion. Sinuses/Orbits: See below Other: None. CT MAXILLOFACIAL FINDINGS Mild irregularity of the nasal bones, potentially nondisplaced acute fractures. No other evidence of acute maxillofacial fracture. There is some mucosal thickening in the right frontal and left maxillary sinuses. No air-fluid levels are identified. The mastoid air cells and middle ears are clear. TMJ degenerative changes are present bilaterally. There is mild left supraorbital soft tissue swelling. No evidence of orbital hematoma. The globes are intact. The optic nerves and extraocular muscles appear normal. CT CERVICAL SPINE FINDINGS The alignment is normal. There is no evidence of acute fracture or traumatic subluxation. There is mild spondylosis, primarily at C1-2. No acute soft tissue findings or high-grade spinal stenosis demonstrated. Carotid arterial calcifications are present bilaterally. There is also atherosclerosis of the aortic arch. IMPRESSION: 1. Right hemispheric subdural hematoma as described. No resulting midline shift or hydrocephalus. 2. Possible nondisplaced nasal bone fractures. No other acute maxillofacial findings. 3. No evidence of acute cervical spine fracture, traumatic subluxation or static signs of instability. 4. Critical Value/emergent results  were called by telephone at the time of interpretation on 03/21/2016 at 11:20 am to Dr. Fredia Sorrow , who verbally acknowledged these results. Electronically Signed   By: Richardean Sale M.D.   On: 03/21/2016 11:24    Lab Data:  CBC:  Recent Labs Lab 03/21/16 1300 03/22/16 0758  WBC 6.6 5.9  NEUTROABS 4.8  --   HGB 13.6 12.8*  HCT 41.2 40.3  MCV 99.5 100.8*  PLT 81* 79*   Basic Metabolic Panel:  Recent Labs Lab 03/21/16 1300 03/22/16 0758 03/23/16 0522  NA 141 140 143  K 4.4 4.2 4.0  CL 106 108 112*  CO2 '27 28 25  '$ GLUCOSE 124* 141* 115*  BUN 27* 21* 21*  CREATININE 1.89* 1.78* 1.77*  CALCIUM 9.5 9.1 8.8*   GFR: Estimated Creatinine Clearance: 45.4 mL/min (by C-G formula based on SCr of 1.77 mg/dL). Liver Function Tests:  Recent Labs Lab 03/21/16 1300  AST 16  ALT 12*  ALKPHOS 50  BILITOT 1.4*  PROT 6.7  ALBUMIN 3.8   No results for input(s): LIPASE, AMYLASE in the last 168 hours. No results for input(s): AMMONIA in the last 168 hours. Coagulation Profile:  Recent Labs Lab 03/21/16 1300  INR 1.07   Cardiac Enzymes: No results for input(s): CKTOTAL, CKMB, CKMBINDEX, TROPONINI in the last 168 hours. BNP (last 3 results) No results for input(s): PROBNP in the last 8760 hours. HbA1C: No results for input(s): HGBA1C in the last 72 hours. CBG:  Recent Labs Lab 03/22/16 0341 03/22/16 0756 03/22/16 1203 03/22/16 2142 03/23/16 0818  GLUCAP 121* 146* 133* 130* 106*   Lipid Profile: No results for input(s): CHOL, HDL, LDLCALC, TRIG, CHOLHDL, LDLDIRECT in the last 72 hours. Thyroid Function Tests: No results for input(s): TSH, T4TOTAL, FREET4, T3FREE, THYROIDAB in the last 72 hours. Anemia Panel:  Recent Labs  03/23/16 0522  VITAMINB12 208  FOLATE 7.3   Urine analysis:    Component Value Date/Time   COLORURINE YELLOW 10/06/2013 1527   APPEARANCEUR CLOUDY (A) 10/06/2013 1527   LABSPEC 1.018 10/06/2013 1527   PHURINE 5.5 10/06/2013 1527    GLUCOSEU NEGATIVE 10/06/2013 1527   HGBUR LARGE (A) 10/06/2013 1527   BILIRUBINUR NEGATIVE 10/06/2013 1527   KETONESUR NEGATIVE 10/06/2013 1527   PROTEINUR 100 (A) 10/06/2013 1527   UROBILINOGEN 1.0 10/06/2013 1527   NITRITE NEGATIVE 10/06/2013 1527   LEUKOCYTESUR MODERATE (A) 10/06/2013 1527  Keandre Linden M.D. [facility] 03/23/2016, 11:12 AM  Pager: [phone] Between 7am to 7pm - call Pager - [phone]  After 7pm go to [email or web address] - password [email or web address]  Call night coverage person covering after 7pm

## 2016-03-23 NOTE — Progress Notes (Signed)
Physical Therapy Treatment Patient Details Name: Kyle [name] MRN: [id number] DOB: 1934/06/08 Today's Date: 03/23/2016    History of Present Illness 82 y.o. male with hx of falls admitted after fall with parietal SDH and nondisplaced nasal fx. PMHx: diplopia, ITP idiopathic thrombocytopenic purpura, HTN, DM, CKD, obesity, lung CA, bladder CA    PT Comments    Pt pleasant with sons and wife present. Family states they currently do not have sufficient 24hr assist and are interested in ST-SNF. Pt with impaired balance, transfers and functional activity who could benefit from ST-SNF to decrease caregiver burden and maximize pt function prior to return home. Pt with long standing diplopia on top of current balance deficits making him a high fall risk. Pt able to don/doff sock in sitting and states he normally bends over for bathing, educated for long handled sponge. Pt also requires frequent cueing for safety with transfers, gait and RW use. Will continue to follow acutely.   Follow Up Recommendations  SNF;Supervision/Assistance - 24 hour     Equipment Recommendations  None recommended by PT    Recommendations for Other Services       Precautions / Restrictions Precautions Precautions: Fall Precaution Comments: diplopia    Mobility  Bed Mobility   Bed Mobility: Supine to Sit     Supine to sit: Supervision     General bed mobility comments: supervision for lines, increased time with bed flat  Transfers Overall transfer level: Needs assistance   Transfers: Sit to/from Stand Sit to Stand: Min guard         General transfer comment: cues for hand placement, RW placement and stability x 3 trials with pt tendency to ditch walker prior to surface  Ambulation/Gait Ambulation/Gait assistance: Min assist Ambulation Distance (Feet): 200 Feet Assistive device: Rolling walker (2 wheeled) Gait Pattern/deviations: Step-through pattern;Decreased stride length;Trunk flexed   Gait  velocity interpretation: Below normal speed for age/gender General Gait Details: pt with overall steady gait with RW, pt unable to perform head turns and maintain gait. Pt noted increased dizziness with head turns and directional changes. Cues for safety, posture and RW use throughout   Stairs Stairs: Yes Stairs assistance: Min assist Stair Management: Forwards;Two rails;Step to pattern Number of Stairs: 2 General stair comments: cues for sequence and safety  Wheelchair Mobility    Modified Rankin (Stroke Patients Only)       Balance                                    Cognition Arousal/Alertness: Awake/alert Behavior During Therapy: Flat affect Overall Cognitive Status: Within Functional Limits for tasks assessed                      Exercises      General Comments        Pertinent Vitals/Pain Pain Score: 3  Pain Location: face Pain Descriptors / Indicators: Aching Pain Intervention(s): Limited activity within patient's tolerance;Repositioned    Home Living                      Prior Function            PT Goals (current goals can now be found in the care plan section) Progress towards PT goals: Progressing toward goals    Frequency  Min 3X/week    PT Plan Discharge plan needs to be updated  Co-evaluation             End of Session Equipment Utilized During Treatment: Gait belt Activity Tolerance: Patient tolerated treatment well Patient left: in chair;with call bell/phone within reach;with family/visitor present;with chair alarm set     Time: 5374-8270 PT Time Calculation (min) (ACUTE ONLY): 37 min  Charges:  $Gait Training: 8-22 mins $Therapeutic Activity: 8-22 mins                    G Codes:      Melford Aase 2016-04-06, 11:01 AM Elwyn Reach, [place]

## 2016-03-24 ENCOUNTER — Inpatient Hospital Stay
Admission: RE | Admit: 2016-03-24 | Discharge: 2016-04-13 | Disposition: A | Discharge status: Home / Self Care | Payer: Medicare Other | Source: Ambulatory Visit | Attending: Internal Medicine | Admitting: Internal Medicine

## 2016-03-24 DIAGNOSIS — S065X0A Traumatic subdural hemorrhage without loss of consciousness, initial encounter: Secondary | ICD-10-CM | POA: Diagnosis not present

## 2016-03-24 DIAGNOSIS — D693 Immune thrombocytopenic purpura: Secondary | ICD-10-CM | POA: Diagnosis not present

## 2016-03-24 DIAGNOSIS — R609 Edema, unspecified: Secondary | ICD-10-CM | POA: Diagnosis not present

## 2016-03-24 DIAGNOSIS — R262 Difficulty in walking, not elsewhere classified: Secondary | ICD-10-CM | POA: Diagnosis not present

## 2016-03-24 DIAGNOSIS — J309 Allergic rhinitis, unspecified: Secondary | ICD-10-CM | POA: Diagnosis not present

## 2016-03-24 DIAGNOSIS — I10 Essential (primary) hypertension: Secondary | ICD-10-CM | POA: Diagnosis not present

## 2016-03-24 DIAGNOSIS — J449 Chronic obstructive pulmonary disease, unspecified: Secondary | ICD-10-CM | POA: Diagnosis not present

## 2016-03-24 DIAGNOSIS — K439 Ventral hernia without obstruction or gangrene: Secondary | ICD-10-CM | POA: Diagnosis not present

## 2016-03-24 DIAGNOSIS — C3491 Malignant neoplasm of unspecified part of right bronchus or lung: Secondary | ICD-10-CM | POA: Diagnosis not present

## 2016-03-24 DIAGNOSIS — J438 Other emphysema: Secondary | ICD-10-CM | POA: Diagnosis not present

## 2016-03-24 DIAGNOSIS — S022XXA Fracture of nasal bones, initial encounter for closed fracture: Secondary | ICD-10-CM | POA: Diagnosis not present

## 2016-03-24 DIAGNOSIS — J3089 Other allergic rhinitis: Secondary | ICD-10-CM | POA: Diagnosis not present

## 2016-03-24 DIAGNOSIS — G4733 Obstructive sleep apnea (adult) (pediatric): Secondary | ICD-10-CM | POA: Diagnosis not present

## 2016-03-24 DIAGNOSIS — D696 Thrombocytopenia, unspecified: Secondary | ICD-10-CM | POA: Diagnosis not present

## 2016-03-24 DIAGNOSIS — I2510 Atherosclerotic heart disease of native coronary artery without angina pectoris: Secondary | ICD-10-CM | POA: Diagnosis not present

## 2016-03-24 DIAGNOSIS — E0821 Diabetes mellitus due to underlying condition with diabetic nephropathy: Secondary | ICD-10-CM | POA: Diagnosis not present

## 2016-03-24 DIAGNOSIS — N183 Chronic kidney disease, stage 3 (moderate): Secondary | ICD-10-CM | POA: Diagnosis not present

## 2016-03-24 DIAGNOSIS — R279 Unspecified lack of coordination: Secondary | ICD-10-CM | POA: Diagnosis not present

## 2016-03-24 DIAGNOSIS — R1312 Dysphagia, oropharyngeal phase: Secondary | ICD-10-CM | POA: Diagnosis not present

## 2016-03-24 DIAGNOSIS — E119 Type 2 diabetes mellitus without complications: Secondary | ICD-10-CM | POA: Diagnosis not present

## 2016-03-24 DIAGNOSIS — S022XXD Fracture of nasal bones, subsequent encounter for fracture with routine healing: Secondary | ICD-10-CM | POA: Diagnosis not present

## 2016-03-24 DIAGNOSIS — C679 Malignant neoplasm of bladder, unspecified: Secondary | ICD-10-CM | POA: Diagnosis not present

## 2016-03-24 DIAGNOSIS — I6200 Nontraumatic subdural hemorrhage, unspecified: Principal | ICD-10-CM

## 2016-03-24 DIAGNOSIS — J441 Chronic obstructive pulmonary disease with (acute) exacerbation: Secondary | ICD-10-CM | POA: Diagnosis not present

## 2016-03-24 DIAGNOSIS — R197 Diarrhea, unspecified: Secondary | ICD-10-CM | POA: Diagnosis not present

## 2016-03-24 DIAGNOSIS — W19XXXD Unspecified fall, subsequent encounter: Secondary | ICD-10-CM | POA: Diagnosis not present

## 2016-03-24 DIAGNOSIS — N200 Calculus of kidney: Secondary | ICD-10-CM | POA: Diagnosis not present

## 2016-03-24 DIAGNOSIS — Z9181 History of falling: Secondary | ICD-10-CM | POA: Diagnosis not present

## 2016-03-24 DIAGNOSIS — S065X0D Traumatic subdural hemorrhage without loss of consciousness, subsequent encounter: Secondary | ICD-10-CM | POA: Diagnosis not present

## 2016-03-24 DIAGNOSIS — M6281 Muscle weakness (generalized): Secondary | ICD-10-CM | POA: Diagnosis not present

## 2016-03-24 DIAGNOSIS — I714 Abdominal aortic aneurysm, without rupture: Secondary | ICD-10-CM | POA: Diagnosis not present

## 2016-03-24 DIAGNOSIS — R488 Other symbolic dysfunctions: Secondary | ICD-10-CM | POA: Diagnosis not present

## 2016-03-24 LAB — GLUCOSE, CAPILLARY
GLUCOSE-CAPILLARY: 94 mg/dL (ref 65–99)
GLUCOSE-CAPILLARY: 120 mg/dL — AB (ref 65–99)
Glucose-Capillary: 110 mg/dL — ABNORMAL HIGH (ref 65–99)

## 2016-03-24 LAB — BASIC METABOLIC PANEL
ANION GAP: 5 (ref 5–15)
BUN: 24 mg/dL — AB (ref 6–20)
CALCIUM: 8.9 mg/dL (ref 8.9–10.3)
CO2: 29 mmol/L (ref 22–32)
Chloride: 109 mmol/L (ref 101–111)
Creatinine, Ser: 1.86 mg/dL — ABNORMAL HIGH (ref 0.61–1.24)
GFR calc Af Amer: 38 mL/min — ABNORMAL LOW (ref >60)
GFR calc non Af Amer: 33 mL/min — ABNORMAL LOW (ref >60)
GLUCOSE: 105 mg/dL — AB (ref 65–99)
POTASSIUM: 4 mmol/L (ref 3.5–5.1)
Sodium: 143 mmol/L (ref 135–145)

## 2016-03-24 MED ORDER — CYANOCOBALAMIN 1000 MCG PO TABS: 1000.0000 ug | ORAL_TABLET | Freq: Every day | ORAL | Status: DC

## 2016-03-24 MED ORDER — SENNOSIDES-DOCUSATE SODIUM 8.6-50 MG PO TABS
1.0000 | ORAL_TABLET | Freq: Two times a day (BID) | ORAL | Status: DC
  Administered 2016-03-24: 1 via ORAL
  Filled 2016-03-24: qty 1

## 2016-03-24 MED ORDER — FLUTICASONE PROPIONATE 50 MCG/ACT NA SUSP: 1.0000 | Freq: Every day | NASAL | 2 refills | Status: DC

## 2016-03-24 MED ORDER — SENNOSIDES-DOCUSATE SODIUM 8.6-50 MG PO TABS: 1.0000 | ORAL_TABLET | Freq: Two times a day (BID) | ORAL | Status: DC

## 2016-03-24 MED ORDER — POLYETHYLENE GLYCOL 3350 17 G PO PACK
17.0000 g | PACK | Freq: Once | ORAL | Status: AC
  Administered 2016-03-24: 17 g via ORAL
  Filled 2016-03-24: qty 1

## 2016-03-24 NOTE — Clinical Social Work Placement (Signed)
   CLINICAL SOCIAL WORK PLACEMENT  NOTE  Date:  03/24/2016  Patient Details  Name: Kyle [name] MRN: [id number] Date of Birth: [birth year]  Clinical Social Work is seeking post-discharge placement for this patient at the [place] level of care (*CSW will initial, date and re-position this form in  chart as items are completed):  Yes   Patient/family provided with [place] Work Department's list of facilities offering this level of care within the geographic area requested by the patient (or if unable, by the patient's family).  Yes   Patient/family informed of their freedom to choose among providers that offer the needed level of care, that participate in Medicare, Medicaid or managed care program needed by the patient, have an available bed and are willing to accept the patient.  Yes   Patient/family informed of [place]'s ownership interest in [facility] and [facility], as well as of the fact that they are under no obligation to receive care at these facilities.  PASRR submitted to EDS on 03/24/16     PASRR number received on 03/24/16     Existing PASRR number confirmed on       FL2 transmitted to all facilities in geographic area requested by pt/family on 03/24/16     FL2 transmitted to all facilities within larger geographic area on       Patient informed that his/her managed care company has contracts with or will negotiate with certain facilities, including the following:        Yes   Patient/family informed of bed offers received.  Patient chooses bed at [facility]     Physician recommends and patient chooses bed at      Patient to be transferred to [facility] on 03/24/16.  Patient to be transferred to facility by Son by car     Patient family notified on 03/24/16 of transfer.  Name of family member notified:  [name]     PHYSICIAN       Additional Comment:     _______________________________________________ Benard Halsted, [place] 03/24/2016, 12:53 PM

## 2016-03-24 NOTE — Discharge Summary (Signed)
Physician Discharge Summary   Patient ID: Kyle [name] MRN: [id number] DOB/AGE: 1934/02/19 82 y.o.  Admit date: 03/21/2016 Discharge date: 03/24/2016  Primary Care Physician:  Mickie Hillier, MD  Discharge Diagnoses:    . Subdural hematoma, post-traumatic ([place]) . CKD (chronic kidney disease) stage 3, GFR 30-59 ml/min . ITP (idiopathic thrombocytopenic purpura) . Hypertension . Subdural hematoma [facility])   Consults:  Neurosurgery, Dr. Vertell Limber  Recommendations for Outpatient Follow-up:  1. Please follow with neurosurgery, Dr. Vertell Limber in 2-3 weeks, will need outpatient CT head 2. Please repeat CBC/BMET at next visit 3. Patient may need to be restarted on lisinopril if the blood pressure starts to trend up above 140-150's or DBP >100 4. DC metoprolol, aspirin 5. Fall precautions  6. Outpatient ENT follow-up for the nondisplaced nasal bone fracture   DIET: Carb modified diet    Allergies:   Allergies  Allergen Reactions  . Codeine Anaphylaxis  . Etodolac     dizziness     DISCHARGE MEDICATIONS: Current Discharge Medication List    START taking these medications   Details  fluticasone (FLONASE) 50 MCG/ACT nasal spray Place 1 spray into both nostrils daily. Qty: 16 g, Refills: 2    senna-docusate (SENOKOT-S) 8.6-50 MG tablet Take 1 tablet by mouth 2 (two) times daily.    vitamin B-12 1000 MCG tablet Take 1 tablet (1,000 mcg total) by mouth daily.      CONTINUE these medications which have NOT CHANGED   Details  acetaminophen (TYLENOL) 500 MG tablet Take 500 mg by mouth every 6 (six) hours as needed for mild pain.     albuterol (PROAIR HFA) 108 (90 BASE) MCG/ACT inhaler Inhale 2 puffs into the lungs every 6 (six) hours as needed for wheezing. Qty: 1 Inhaler, Refills: 5    ALPRAZolam (XANAX) 0.5 MG tablet TAKE ONE TABLET TWICE DAILY AS NEEDED FOR ANXIETY Qty: 60 tablet, Refills: 5    calcitRIOL (ROCALTROL) 0.25 MCG capsule Take 0.25 mcg by mouth daily.     cetirizine  (ZYRTEC) 10 MG tablet Take 1 tablet (10 mg total) by mouth daily. Qty: 30 tablet, Refills: 5    citalopram (CELEXA) 20 MG tablet TAKE ONE (1) TABLET BY MOUTH EVERY DAY Qty: 90 tablet, Refills: 0    ipratropium (ATROVENT) 0.06 % nasal spray Place 2 sprays into both nostrils 4 (four) times daily. Qty: 15 mL, Refills: 5    niacin (NIASPAN) 500 MG CR tablet Take 2 tablets (1,000 mg total) by mouth at bedtime. Qty: 60 tablet, Refills: 5    nitroGLYCERIN (NITROSTAT) 0.4 MG SL tablet Place 1 tablet (0.4 mg total) under the tongue every 5 (five) minutes as needed. Call MD if need more than 2 Qty: 3 tablet, Refills: 0    pravastatin (PRAVACHOL) 80 MG tablet TAKE ONE (1) TABLET EACH DAY Qty: 90 tablet, Refills: 1    SPIRIVA HANDIHALER 18 MCG inhalation capsule INHALE 1 DOSE BY MOUTH ONCE DAILY Qty: 90 capsule, Refills: 1      STOP taking these medications     aspirin EC 81 MG tablet      lisinopril (PRINIVIL,ZESTRIL) 5 MG tablet      metoprolol (LOPRESSOR) 50 MG tablet          Brief H and P: For complete details please refer to admission H and P, but in brief Per admission note by Dr. Orvan Falconer on 9/1 Keithen A Mooreis an 82 y.o.malewithhx of fall tendency, ITP with average platelet count of about 80K,  on ASA '81mg'$  per day, hx of HTN, DM, CKD, obesity, hx of lung CA, bladder CA, fell today without LOC. Evalaution in the ER included a head CT showing a 45m subdural hematoma, layering out over the parietal lobe, without shift or mass effect. EDP consulted Dr SVertell Limberof neurosurgery who felt that he unlikely to need any surgical intervention, and [facility]. If no progression, he recommneded a 2 week OP follow with with a head CT prior to the appointment. Patient also has abrasion over his left face, and was given a Td shot. He has a upper lip laceration, with CT of the maxillofacial bone showing possible non displaced  fx of the nasal bone.    Hospital Course:   Subdural hematoma, post-traumatic (HCC) - Due to mechanical fall. Repeat CT head this morning showed no significant interval change in the size of the right subdural hematoma measuring up to 11 mm in the maximal thickness, small acute left subdural hematoma measuring up to 4 mm in the maximal thickness, no significant mass effect. - Continue to hold aspirin -Cleared from a neurosurgical standpoint by Dr. SVertell Limber needs outpatient follow-up and CT head  Nondisplaced nasal bone fracture - Continue Flonase and outpatient follow-up with Dr. TBenjamine Molain next 10 days, discussed with patient's wife in detail    Multiple falls, dizziness, Orthostatic hypotension -Per patient feels dizziness on bending down to pick up something, has balance issues, no vertigo   - BP somewhat low, positive orthostatic vitals, patient was placed on gentle hydration - 2-D echo showed EF of 50-55% with no critical valvular disorders - Discontinued lisinopril - Folate, cortisol level normal, B12 on the lower end of normal, placed on B12 replacement - BP does not need to be so stringent with patient's age and orthostasis, high fall risk, okay to have systolic BP in 1235T 1614E- PT evaluation and rehabilitation at skilled nursing facility.    Hypertension - BP currently low, discontinued ACE inhibitor    CKD (chronic kidney disease) stage 3, GFR 30-59 ml/min - Creatinine currently at baseline status    Diabetes (HCC) - Continue sliding scale insulin     Day of Discharge BP 132/61   Pulse 60   Temp 97.8 F (36.6 C) (Oral)   Resp 18   Ht 6' (1.829 m)   Wt 120.8 kg (266 lb 5.1 oz)   SpO2 95%   BMI 36.12 kg/m   Physical Exam: General: Alert and awake oriented x3 not in any acute distress. HEENT: anicteric sclera, pupils reactive to light and accommodation, facial bruises improving CVS: S1-S2 clear no murmur rubs or gallops Chest: clear to auscultation  bilaterally, no wheezing rales or rhonchi Abdomen: soft nontender, nondistended, normal bowel sounds Extremities: no cyanosis, clubbing or edema noted bilaterally Neuro: Cranial nerves II-XII intact, no focal neurological deficits   The results of significant diagnostics from this hospitalization (including imaging, microbiology, ancillary and laboratory) are listed below for reference.    LAB RESULTS: Basic Metabolic Panel:  Recent Labs Lab 03/23/16 0522 03/24/16 0344  NA 143 143  K 4.0 4.0  CL 112* 109  CO2 25 29  GLUCOSE 115* 105*  BUN 21* 24*  CREATININE 1.77* 1.86*  CALCIUM 8.8* 8.9   Liver Function Tests:  Recent Labs Lab 03/21/16 1300  AST 16  ALT 12*  ALKPHOS 50  BILITOT 1.4*  PROT 6.7  ALBUMIN 3.8   No results for input(s):  LIPASE, AMYLASE in the last 168 hours. No results for input(s): AMMONIA in the last 168 hours. CBC:  Recent Labs Lab 03/21/16 1300 03/22/16 0758  WBC 6.6 5.9  NEUTROABS 4.8  --   HGB 13.6 12.8*  HCT 41.2 40.3  MCV 99.5 100.8*  PLT 81* 79*   Cardiac Enzymes: No results for input(s): CKTOTAL, CKMB, CKMBINDEX, TROPONINI in the last 168 hours. BNP: Invalid input(s): POCBNP CBG:  Recent Labs Lab 03/24/16 0520 03/24/16 0842  GLUCAP 120* 110*    Significant Diagnostic Studies:  Ct Head Wo Contrast  Result Date: 03/22/2016 CLINICAL DATA:  Follow-up exam for acute subdural hematoma. EXAM: CT HEAD WITHOUT CONTRAST TECHNIQUE: Contiguous axial images were obtained from the base of the skull through the vertex without intravenous contrast. COMPARISON:  Prior CT from 03/21/2016. FINDINGS: Brain: Right holo hemispheric subdural hematoma again seen. Overall size of the hematoma is not significantly changed measuring up to 11 mm in maximal thickness at the right parietal convexity. Slight extension along the posterior falx. Extension along the tentorium present as well. Probable trace underlying subarachnoid hemorrhage noted within the  right parieto-occipital region. Small left subdural hematoma present as well, most evident over the left frontal convexity were measures up to 4 mm in maximal thickness. This is grossly similar to previous as well. No other acute intracranial hemorrhage. No evidence for acute large vessel territory infarct. No mass effect or midline shift. No hydrocephalus. Stable atrophy with chronic microvascular ischemic disease. Small left periorbital scalp contusion noted. Scalp soft tissues otherwise unremarkable. No acute abnormality about the globes and orbits. Scattered mucosal thickening throughout the paranasal sinuses. Right frontal sinus partially opacified. Possible nasal bone fracture again noted. No mastoid effusion. Calvarium intact. IMPRESSION: 1. No significant interval change in size of right subdural hematoma measuring up to 11 mm in maximal thickness. No significant mass effect or midline shift. 2. Smaller acute left subdural hematoma measuring up to 4 mm in maximal thickness without significant mass effect. This is unchanged from prior study. 3. No other acute intracranial process identified. Electronically Signed   By: Jeannine Boga M.D.   On: 03/22/2016 06:57   Ct Head Wo Contrast  Result Date: 03/21/2016 CLINICAL DATA:  Fall today. Left facial abrasions. History of colon, long and bladder cancer. EXAM: CT HEAD WITHOUT CONTRAST CT MAXILLOFACIAL WITHOUT CONTRAST CT CERVICAL SPINE WITHOUT CONTRAST TECHNIQUE: Multidetector CT imaging of the head, cervical spine, and maxillofacial structures were performed using the standard protocol without intravenous contrast. Multiplanar CT image reconstructions of the cervical spine and maxillofacial structures were also generated. COMPARISON:  PET-CT 10/03/2013. FINDINGS: CT HEAD FINDINGS Brain: There is a right hemispheric subdural hematoma layering over the frontal, parietal and temporal lobes. This measures up to 11 mm in thickness over the posterior parietal  region. There is no resulting midline shift, intraparenchymal hematoma or hydrocephalus. There is no evidence of acute stroke, brain edema or mass lesion. Vascular: Intracranial vascular calcifications are present. Skull: Negative for fracture or focal lesion. Sinuses/Orbits: See below Other: None. CT MAXILLOFACIAL FINDINGS Mild irregularity of the nasal bones, potentially nondisplaced acute fractures. No other evidence of acute maxillofacial fracture. There is some mucosal thickening in the right frontal and left maxillary sinuses. No air-fluid levels are identified. The mastoid air cells and middle ears are clear. TMJ degenerative changes are present bilaterally. There is mild left supraorbital soft tissue swelling. No evidence of orbital hematoma. The globes are intact. The optic nerves and extraocular muscles appear normal. CT CERVICAL  SPINE FINDINGS The alignment is normal. There is no evidence of acute fracture or traumatic subluxation. There is mild spondylosis, primarily at C1-2. No acute soft tissue findings or high-grade spinal stenosis demonstrated. Carotid arterial calcifications are present bilaterally. There is also atherosclerosis of the aortic arch. IMPRESSION: 1. Right hemispheric subdural hematoma as described. No resulting midline shift or hydrocephalus. 2. Possible nondisplaced nasal bone fractures. No other acute maxillofacial findings. 3. No evidence of acute cervical spine fracture, traumatic subluxation or static signs of instability. 4. Critical Value/emergent results were called by telephone at the time of interpretation on 03/21/2016 at 11:20 am to Dr. Fredia Sorrow , who verbally acknowledged these results. Electronically Signed   By: Richardean Sale M.D.   On: 03/21/2016 11:24   Ct Cervical Spine Wo Contrast  Result Date: 03/21/2016 CLINICAL DATA:  Fall today. Left facial abrasions. History of colon, long and bladder cancer. EXAM: CT HEAD WITHOUT CONTRAST CT MAXILLOFACIAL WITHOUT  CONTRAST CT CERVICAL SPINE WITHOUT CONTRAST TECHNIQUE: Multidetector CT imaging of the head, cervical spine, and maxillofacial structures were performed using the standard protocol without intravenous contrast. Multiplanar CT image reconstructions of the cervical spine and maxillofacial structures were also generated. COMPARISON:  PET-CT 10/03/2013. FINDINGS: CT HEAD FINDINGS Brain: There is a right hemispheric subdural hematoma layering over the frontal, parietal and temporal lobes. This measures up to 11 mm in thickness over the posterior parietal region. There is no resulting midline shift, intraparenchymal hematoma or hydrocephalus. There is no evidence of acute stroke, brain edema or mass lesion. Vascular: Intracranial vascular calcifications are present. Skull: Negative for fracture or focal lesion. Sinuses/Orbits: See below Other: None. CT MAXILLOFACIAL FINDINGS Mild irregularity of the nasal bones, potentially nondisplaced acute fractures. No other evidence of acute maxillofacial fracture. There is some mucosal thickening in the right frontal and left maxillary sinuses. No air-fluid levels are identified. The mastoid air cells and middle ears are clear. TMJ degenerative changes are present bilaterally. There is mild left supraorbital soft tissue swelling. No evidence of orbital hematoma. The globes are intact. The optic nerves and extraocular muscles appear normal. CT CERVICAL SPINE FINDINGS The alignment is normal. There is no evidence of acute fracture or traumatic subluxation. There is mild spondylosis, primarily at C1-2. No acute soft tissue findings or high-grade spinal stenosis demonstrated. Carotid arterial calcifications are present bilaterally. There is also atherosclerosis of the aortic arch. IMPRESSION: 1. Right hemispheric subdural hematoma as described. No resulting midline shift or hydrocephalus. 2. Possible nondisplaced nasal bone fractures. No other acute maxillofacial findings. 3. No  evidence of acute cervical spine fracture, traumatic subluxation or static signs of instability. 4. Critical Value/emergent results were called by telephone at the time of interpretation on 03/21/2016 at 11:20 am to Dr. Fredia Sorrow , who verbally acknowledged these results. Electronically Signed   By: Richardean Sale M.D.   On: 03/21/2016 11:24   Dg Hand Complete Left  Result Date: 03/21/2016 CLINICAL DATA:  Pain following fall EXAM: LEFT HAND - COMPLETE 3+ VIEW COMPARISON:  Left thumb May 28, 2012 FINDINGS: Frontal, oblique, and lateral views were obtained. There is no demonstrable fracture or dislocation. There is mild osteoarthritic change in the first carpal -metacarpal and first IP joints. No erosive changes. No radiopaque foreign body or soft tissue air. IMPRESSION: Areas of mild osteoarthritic change laterally. No fracture or dislocation. No radiopaque foreign body or soft tissue air. Electronically Signed   By: Lowella Grip III M.D.   On: 03/21/2016 10:20   Ct Maxillofacial  Wo Cm  Result Date: 03/21/2016 CLINICAL DATA:  Fall today. Left facial abrasions. History of colon, long and bladder cancer. EXAM: CT HEAD WITHOUT CONTRAST CT MAXILLOFACIAL WITHOUT CONTRAST CT CERVICAL SPINE WITHOUT CONTRAST TECHNIQUE: Multidetector CT imaging of the head, cervical spine, and maxillofacial structures were performed using the standard protocol without intravenous contrast. Multiplanar CT image reconstructions of the cervical spine and maxillofacial structures were also generated. COMPARISON:  PET-CT 10/03/2013. FINDINGS: CT HEAD FINDINGS Brain: There is a right hemispheric subdural hematoma layering over the frontal, parietal and temporal lobes. This measures up to 11 mm in thickness over the posterior parietal region. There is no resulting midline shift, intraparenchymal hematoma or hydrocephalus. There is no evidence of acute stroke, brain edema or mass lesion. Vascular: Intracranial vascular  calcifications are present. Skull: Negative for fracture or focal lesion. Sinuses/Orbits: See below Other: None. CT MAXILLOFACIAL FINDINGS Mild irregularity of the nasal bones, potentially nondisplaced acute fractures. No other evidence of acute maxillofacial fracture. There is some mucosal thickening in the right frontal and left maxillary sinuses. No air-fluid levels are identified. The mastoid air cells and middle ears are clear. TMJ degenerative changes are present bilaterally. There is mild left supraorbital soft tissue swelling. No evidence of orbital hematoma. The globes are intact. The optic nerves and extraocular muscles appear normal. CT CERVICAL SPINE FINDINGS The alignment is normal. There is no evidence of acute fracture or traumatic subluxation. There is mild spondylosis, primarily at C1-2. No acute soft tissue findings or high-grade spinal stenosis demonstrated. Carotid arterial calcifications are present bilaterally. There is also atherosclerosis of the aortic arch. IMPRESSION: 1. Right hemispheric subdural hematoma as described. No resulting midline shift or hydrocephalus. 2. Possible nondisplaced nasal bone fractures. No other acute maxillofacial findings. 3. No evidence of acute cervical spine fracture, traumatic subluxation or static signs of instability. 4. Critical Value/emergent results were called by telephone at the time of interpretation on 03/21/2016 at 11:20 am to Dr. Fredia Sorrow , who verbally acknowledged these results. Electronically Signed   By: Richardean Sale M.D.   On: 03/21/2016 11:24    2D ECHO: Study Conclusions  - Left ventricle: The cavity size was normal. There was mild focal   basal hypertrophy of the septum. Systolic function was normal.   The estimated ejection fraction was in the range of 50% to 55%.   Doppler parameters are consistent with abnormal left ventricular   relaxation (grade 1 diastolic dysfunction). - Aortic valve: Mildly calcified annulus.  Probably trileaflet;   mildly calcified leaflets. - Mitral valve: Calcified annulus. - Left atrium: The atrium was mildly dilated. - Tricuspid valve: There was physiologic regurgitation. - Pulmonary arteries: Systolic pressure could not be accurately   estimated. - Pericardium, extracardiac: Possible trivial pericardial effusion.  Impressions:  - Extremely limited images. Appears to be mild basal septal LV   hypertrophy with LVEF approximately 50-55%. Cannot assess focal   wall motion. Grade 1 diastolic dysfunction. Mildly sclerotic   aortic valve and mitral annulus. Mild left atrial enlargement.   Possible trivial pericardial effusion.  Disposition and Follow-up:    DISPOSITION: SNF   DISCHARGE FOLLOW-UP Follow-up Information    Mickie Hillier, MD. Schedule an appointment as soon as possible for a visit in 2 week(s).   Specialty:  Family Medicine Contact information: [address] [place] [address] [phone]        STERN,JOSEPH D, MD. Schedule an appointment as soon as possible for a visit in 2 week(s).   Specialty:  Neurosurgery Contact information: [address] [place] [address] [phone]        Ascencion Dike, MD. Schedule an appointment as soon as possible for a visit in 10 day(s).   Specialty:  Otolaryngology Contact information: [address]  [place] [address] [phone]            Time spent on Discharge: 47mns   Signed:   RAI,RIPUDEEP M.D. [facility] 03/24/2016, 12:37 PM Pager: [phone]

## 2016-03-24 NOTE — Care Management Note (Signed)
Case Management Note  Patient Details  Name: Kyle [name] MRN: [id number] Date of Birth: [birth year]  Subjective/Objective:              Pt with hx of fall tendency, ITP, HTN, DM, CKD, obesity,  lung CA, bladder CA, presents s/p fall . Evalaution in the ER included a head CT showing a 29m subdural hematoma.    Action/Plan: Plan is to d/c to SNF today.  Expected Discharge Date:    03/24/2016           [place]charge Plan:  [place] Referral:  Clinical Social Work  Discharge planning Services  CM Consult    Status of Service:  Completed, signed off  If discussed at [facility] Stay Meetings, dates discussed:    Additional Comments:  CSharin Mons RN 03/24/2016, 10:40 AM

## 2016-03-24 NOTE — Clinical Social Work Note (Signed)
Clinical Social Work Assessment  Patient Details  Name: Kyle [name] MRN: [id number] Date of Birth: 1933/12/18  Date of referral:  03/24/16               Reason for consult:  Facility Placement                Permission sought to share information with:  Facility Sport and exercise psychologist, Family Supports Permission granted to share information::  Yes, Verbal Permission Granted  Name::     Kyle [name]  Agency::  SNFs  Relationship::  Spouse  Contact Information:  [phone]  Housing/Transportation Living arrangements for the past 2 months:  Single Family Home Source of Information:  Spouse, Adult Children Patient Interpreter Needed:  None Criminal Activity/Legal Involvement Pertinent to Current Situation/Hospitalization:  No - Comment as needed Significant Relationships:  Adult Children, Spouse Lives with:  Spouse Do you feel safe going back to the place where you live?  No Need for family participation in patient care:  Yes (Comment)  Care giving concerns:  CSW received referral for possible SNF placement at time of discharge. CSW met with patient and patient's wife and 2 sons at bedside regarding PT recommendation of SNF placement at time of discharge. Per patient's wife, patient's wife is currently unable to care for patient at their home given patient's current physical needs and fall risk. Patient and patient's wife expressed understanding of PT recommendation and are agreeable to SNF placement at time of discharge. CSW to continue to follow and assist with discharge planning needs.   Social Worker assessment / plan:  CSW spoke with patient and patient's wife and sons concerning possibility of rehab at [facility] before returning home.  Employment status:  Retired Nurse, adult PT Recommendations:  [place] / Referral to community resources:  [place]  Patient/Family's Response to care:  Patient and patient's spouse  recognize need for rehab before returning home and are agreeable to a SNF in [place]. Patient reported preference for [facility].  Patient/Family's Understanding of and Emotional Response to Diagnosis, Current Treatment, and Prognosis:  Patient/family is realistic regarding therapy needs and expressed being hopeful for SNF placement. No questions/concerns about plan or treatment.    Emotional Assessment Appearance:  Appears stated age Attitude/Demeanor/Rapport:  Other (Appropriate) Affect (typically observed):  Accepting, Appropriate, Quiet Orientation:  Oriented to Self, Oriented to Place, Oriented to  Time, Oriented to Situation Alcohol / Substance use:  Not Applicable Psych involvement (Current and /or in the community):  No (Comment)  Discharge Needs  Concerns to be addressed:  Care Coordination Readmission within the last 30 days:  No Current discharge risk:  None Barriers to Discharge:  No Barriers Identified   Benard Halsted, [place] 03/24/2016, 12:59 PM

## 2016-03-24 NOTE — Progress Notes (Signed)
Marland Kitchen    HEMATOLOGY/ONCOLOGY CONSULTATION NOTE  Date of Service: .02/15/2016  Patient Care Team: Mikey Kirschner, MD as PCP - General (Family Medicine) Myrlene Broker, MD (Urology) Williams Che, MD (Ophthalmology) Caprice Beaver, DPM (Podiatry) Lavonna Monarch, MD (Dermatology) Provider Not In System Daneil Dolin, MD (Gastroenterology) Melrose Nakayama, MD as Consulting Physician (Cardiothoracic Surgery) Kathee Delton, MD as Consulting Physician (Pulmonary Disease) Herminio Commons, MD as Attending Physician (Cardiology) Jon Billings, RN as [place] Management Tresa Endo (Urology - [facility])  CHIEF COMPLAINTS/PURPOSE OF CONSULTATION:  Management of urethral carcinoma of  HISTORY OF PRESENTING ILLNESS:    Kyle [name] is a wonderful 82 y.o. male who has been referred to [place] by Dr .Mickie Hillier, MD /Dr Everardo All for evaluation and management of urethral carcinoma.  Patient is a very pleasant 82 year old Caucasian gentleman with multiple medical comorbidities as noted below including history of lung cancer. He also has carcinoma in situ and bladder cancer which she has been fighting since at least 1998. 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 He underwent 6 weekly treatments with Valstar last treatment on 10/02/14. (Previous history of BCG-osis and mycobacterial epididymitis in the past). -Patient was subsequently monitored with intermittent cystoscopies had 1 on 02/05/2015 which showed atypical cytology possibly from inflammation. Cystoscopy on 05/21/2015 showed diffuse inflammation and a FISH testing was positive. 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 He  had a CT of the abdomen and pelvis on 09/05/2015 which showed stable appearance of the prostate. No overt evidence of metastases. Several other chronic findings.  He was considered not to be a good candidate for surgery and was referred to medical oncology and radiation oncology to consider concurrent chemoradiation. Considerations complicated by his history of chronic kidney disease- as a result of that she was not considered to be a candidate for cisplatin.  His creatinine clearance was thought to be okayed of Capecitabine with concurrent radiation therapy (Thought to have Stage II or III disease - exact staging not possible since the patient was not a surgical candidate and MRI of the pelvis was not possible)  Patient was able to complete his concurrent chemoradiation with the last radiation session being on 12/20/2015. He had some issues with thrombocytopenia requiring dose reduction of Xeloda. He was seen by Dr. Abran Duke on 01/07/2016 and had only some mild dysuria.   He notes some grade 1-2 fatigue. Has had some issues with diarrhea and constipation which are better. Still notes some mild hematuria. Has lost about 5-10 pounds through his treatment. No significant abdominal pain at this time. Dysuria is improving.  Notes that he has a follow-up with Dr. Rosana Hoes for repeat cystoscopic examination and bladder and urethral biopsy.  He also has follow-up with radiation oncology and nephrology.  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 Baldemar Friday. Was not  thought to be a good candidate for  postoperative chemotherapy.  MEDICAL HISTORY:  Past Medical History:  Diagnosis Date  . AAA (abdominal aortic aneurysm) ([place]) 2004   s/p repair 2004; 4.3 cm infrarenal in 05/2011  . Abnormality of gait 02/23/2013  . Adenocarcinoma of right lung ([place]) 02/24/2011   Ct A/P 2012:  2cm lung mass RLL PET 8119:  Hypermetabolic RLL mass, no other hypermetabolic areas. TTNA 02/2011:  Adenocarcinoma, markers c/w lung origin Right lower lobe superior segmentectomy. 04/01/2011 Dr. Arlyce Dice   . Adenocarcinoma, lung ([place]) 01/2011   transthoracic FNA; resection of the superior segment of the RLL in 03/2011; negative nodes; no chemotherapy nor radiation planned  . Arm fracture    right arm  . Arteriosclerotic cardiovascular disease (ASCVD) 1973, 12/2010   S/P NSTEMI secondary to distal RCA/PL lesion, tx medically.  EF of  55%-60% per  echo.  . Benign prostatic hypertrophy    s/p transurethral resection of the prostate  . Bilateral renal masses    Cystic, more prominent on CT in 12/2010 than 2007; followed by Dr. Rosana Hoes  . Bladder cancer [facility]) 1996   Transurethral resection of the bladder + chemotherapy/BCG as premed  . Cataract   . Chronic kidney disease    Creatinine 1.4 on discharge 12/20/2100; proteinuria; normal renal ultrasound in 2010; recent creatinines of 1.7-2.; Bilateral cystic renal masses by CT in 2011  . COPD (chronic obstructive pulmonary disease) ([place])   . Coronary artery disease   . Cough    thick phlegm  . Diabetes mellitus    Type II  . Diplopia 02/23/2013  . Diverticulosis   . Essential and other specified forms of tremor 02/23/2013  . Hx of Clostridium difficile infection   . Hyperlipidemia   . Hypertension   . Insomnia   . ITP (idiopathic thrombocytopenic purpura) 09/07/2012   Chronic ITP of adults versus medication-induced ITP.  Stable  . Myocardial infarction ([place])   . Nephrolithiasis 2012   ARF in 01/2011 due to obstructing nephrolithiasis  . Obesity   . OSA (obstructive sleep apnea)     . Polyneuropathy in diabetes(357.2) 02/23/2013  . Thrombocytopenia ([place])   . Tobacco abuse    50-pack-year consumption; quit in 12/2010  . Tubular adenoma of colon   . Ventral hernia     SURGICAL HISTORY: Past Surgical History:  Procedure Laterality Date  . ABDOMINAL AORTIC ANEURYSM REPAIR  2004  . CARDIAC CATHETERIZATION    . COLONOSCOPY  03/19/2010   Dr. Gala Romney -(poor prep) Anal papilla, rectal hyperplastic polyp, tubular adenoma removed splenic flexure, left-sided diverticula  . COLONOSCOPY  11/28/2004   RMR:  Diminutive rectal and left colon polyps as described above, cold  biopsied/removed/  Left sided diverticula. The remainder of the colonic mucosa appeared normal.  . COLONOSCOPY   09/15/01   RMR: Multiple diminutive polyps destroyed with dermolysis as described above/ Multiple small polyps on stalks in the colon resected with snare cautery/ Scattered pan colonic diverticulum/ The remainder of the colonic mucosa appeared normal  . COLONOSCOPY N/A 05/01/2015   Procedure: COLONOSCOPY;  Surgeon: Daneil Dolin, MD;  [facility];  Service: Endoscopy;  Laterality: N/A;  1115  . CYSTECTOMY    . CYSTOSCOPY  04/2014  . CYSTOSTOMY W/ BLADDER BIOPSY    . FLEXIBLE SIGMOIDOSCOPY  2014   Dr. Olevia Perches: tubular adenoma, negative stool studies   . LUNG LOBECTOMY    . TONSILLECTOMY    . TRANSURETHRAL RESECTION OF PROSTATE    .  VIDEO BRONCHOSCOPY WITH ENDOBRONCHIAL NAVIGATION N/A 10/10/2013   Procedure: VIDEO BRONCHOSCOPY WITH ENDOBRONCHIAL NAVIGATION;  Surgeon: Melrose Nakayama, MD;  Location: [place];  Service: Thoracic;  Laterality: N/A;  NO BLOOD THINNERS BUT PATIENT HAS ITP  . WEDGE RESECTION  04/2011   carcinoma of lung    SOCIAL HISTORY: Social History   Social History  . Marital status: Married    Spouse name: [name]   . Number of children: 2  . Years of education: 12+   Occupational History  . Retired  [employer]   Social History Main Topics  . Smoking status:  Former Smoker    Packs/day: 1.00    Years: 50.00    Types: Cigarettes    Start date: 07/22/1955    Quit date: 12/19/2012  . Smokeless tobacco: Never Used  . Alcohol use No  . Drug use: No  . Sexual activity: No   Other Topics Concern  . Not on file   Social History Narrative   Patient lives at home with his wife [name].    Patient has 2 children.    Patient is retired.    Patient is right handed.    Patient has 2 years of college.    3-4 cups of caffeine daily.       FAMILY HISTORY: Family History  Problem Relation Age of Onset  . Aortic aneurysm Mother   . Emphysema Father     smoker  . Clotting disorder Father   . Arthritis Father   . Hypertension Father   . Diabetes Father   . Aortic aneurysm Father   . Tremor Father   . Stroke Paternal Grandmother   . Other Paternal Grandfather     brain aneurysm  . Colon cancer Cousin     ALLERGIES:  is allergic to codeine and etodolac.  MEDICATIONS:  Current Outpatient Prescriptions  Medication Sig Dispense Refill  . acetaminophen (TYLENOL) 500 MG tablet Take 500 mg by mouth every 6 (six) hours as needed for mild pain.     Marland Kitchen albuterol (PROAIR HFA) 108 (90 BASE) MCG/ACT inhaler Inhale 2 puffs into the lungs every 6 (six) hours as needed for wheezing. 1 Inhaler 5  . ALPRAZolam (XANAX) 0.5 MG tablet TAKE ONE TABLET TWICE DAILY AS NEEDED FOR ANXIETY 60 tablet 5  . calcitRIOL (ROCALTROL) 0.25 MCG capsule Take 0.25 mcg by mouth daily.     . cetirizine (ZYRTEC) 10 MG tablet Take 1 tablet (10 mg total) by mouth daily. 30 tablet 5  . citalopram (CELEXA) 20 MG tablet TAKE ONE (1) TABLET BY MOUTH EVERY DAY 90 tablet 0  . fluticasone (FLONASE) 50 MCG/ACT nasal spray Place 1 spray into both nostrils daily. 16 g 2  . ipratropium (ATROVENT) 0.06 % nasal spray Place 2 sprays into both nostrils 4 (four) times daily. 15 mL 5  . niacin (NIASPAN) 500 MG CR tablet Take 2 tablets (1,000 mg total) by mouth at bedtime. 60 tablet 5  . nitroGLYCERIN  (NITROSTAT) 0.4 MG SL tablet Place 1 tablet (0.4 mg total) under the tongue every 5 (five) minutes as needed. Call MD if need more than 2 3 tablet 0  . pravastatin (PRAVACHOL) 80 MG tablet TAKE ONE (1) TABLET EACH DAY 90 tablet 1  . senna-docusate (SENOKOT-S) 8.6-50 MG tablet Take 1 tablet by mouth 2 (two) times daily.    Marland Kitchen SPIRIVA HANDIHALER 18 MCG inhalation capsule INHALE 1 DOSE BY MOUTH ONCE DAILY 90 capsule 1  . vitamin B-12 1000  MCG tablet Take 1 tablet (1,000 mcg total) by mouth daily.     No current facility-administered medications for this visit.    Facility-Administered Medications Ordered in Other Visits  Medication Dose Route Frequency Provider Last Rate Last Dose  . acetaminophen (TYLENOL) tablet 500 mg  500 mg Oral Q6H PRN Orvan Falconer, MD   500 mg at 03/24/16 0326  . albuterol (PROVENTIL) (2.5 MG/3ML) 0.083% nebulizer solution 3 mL  3 mL Inhalation Q2H PRN Cherene Altes, MD      . ALPRAZolam Duanne Moron) tablet 0.5 mg  0.5 mg Oral BID PRN Orvan Falconer, MD   0.5 mg at 03/23/16 2028  . citalopram (CELEXA) tablet 20 mg  20 mg Oral Daily Orvan Falconer, MD   20 mg at 03/24/16 1010  . fentaNYL (SUBLIMAZE) injection 25 mcg  25 mcg Intravenous Q2H PRN Cherene Altes, MD   25 mcg at 03/22/16 1707  . fluticasone (FLONASE) 50 MCG/ACT nasal spray 1 spray  1 spray Each Nare Daily Ripudeep Krystal Eaton, MD   1 spray at 03/24/16 1012  . insulin aspart (novoLOG) injection 0-9 Units  0-9 Units Subcutaneous TID WC Ripudeep Krystal Eaton, MD   1 Units at 03/23/16 1423  . lidocaine-EPINEPHrine (XYLOCAINE W/EPI) 1 %-1:100000 (with pres) injection 10 mL  10 mL Infiltration Once Fredia Sorrow, MD      . loratadine (CLARITIN) tablet 10 mg  10 mg Oral Daily Orvan Falconer, MD   10 mg at 03/24/16 1010  . ondansetron (ZOFRAN) injection 4 mg  4 mg Intravenous Once Dorie Rank, MD      . ondansetron [facility]) injection 4 mg  4 mg Intravenous Q6H PRN Cherene Altes, MD   4 mg at 03/21/16 1854  . pravastatin (PRAVACHOL) tablet 80 mg  80 mg  Oral Daily Orvan Falconer, MD   80 mg at 03/24/16 1010  . prochlorperazine (COMPAZINE) injection 10 mg  10 mg Intravenous Q4H PRN Cherene Altes, MD      . senna-docusate (Senokot-S) tablet 1 tablet  1 tablet Oral BID Ripudeep Krystal Eaton, MD   1 tablet at 03/24/16 1010  . tiotropium (SPIRIVA) inhalation capsule 18 mcg  18 mcg Inhalation Daily Orvan Falconer, MD   18 mcg at 03/24/16 0930  . vitamin B-12 (CYANOCOBALAMIN) tablet 1,000 mcg  1,000 mcg Oral Daily Ripudeep Krystal Eaton, MD   1,000 mcg at 03/24/16 1010    REVIEW OF SYSTEMS:    10 Point review of Systems was done is negative except as noted above.  PHYSICAL EXAMINATION: ECOG PERFORMANCE STATUS: 2 - Symptomatic, <50% confined to bed  . Vitals:   02/15/16 1628  BP: (!) 143/67  Pulse: 66  Resp: 18  Temp: 97.7 F (36.5 C)   Filed Weights   02/15/16 1628  Weight: 266 lb 7 oz (120.9 kg)   .Body mass index is 37.16 kg/m.  GENERAL:alert, in no acute distress and comfortable SKIN: No acute rashes  EYES: normal, conjunctiva are pink and non-injected, sclera clear OROPHARYNX:no exudate, no erythema and lips, buccal mucosa, and tongue normal  NECK: supple, no JVD, thyroid normal size, non-tender, without nodularity LYMPH:  no palpable lymphadenopathy in the cervical, axillary or inguinal LUNGS: clear to auscultation with normal respiratory effort HEART: regular rate & rhythm,  no murmurs and no lower extremity edema ABDOMEN: abdomen soft, non-tender, normoactive bowel sounds , No palpable hepatosplenomegaly Musculoskeletal: no cyanosis of digits and no clubbing  PSYCH: alert & oriented x 3 with fluent speech  NEURO: no focal motor/sensory deficits  LABORATORY DATA:  I have reviewed the data as listed      RADIOGRAPHIC STUDIES: I have personally reviewed the radiological images as listed and agreed with the findings in the report.  CT chest without contrast 12/24/2015: IMPRESSION: 1. Posttreatment changes in the right lower lobe with  associated volume loss, stable. No evidence of recurrent or metastatic disease. 2. Coronary artery calcification. 3. Enlarged pulmonary arteries, indicative of pulmonary arterial hypertension.   Electronically Signed   By: Lorin Picket M.D.   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 Lawerance Bach since 1996 2) recent grade 3 stage 2-3 transitional cell carcinoma of the prosthetic urethra diagnosed in February 2017 status post concurrent chemotherapy (Capecitabine) and radiation completed on 12/20/2015. Appears to be tolerated well with grade 1-2 fatigue, some mild hematuria and dysuria which are resolving. Some thrombocytopenia which is improving. 3) CKD stage III follows with Dr. Gwynneth Macleod Plan -No immediate posttreatment prohibitive toxicities. -Has follow-up with Dr. Lawerance Bach in early August 2017 for repeat cystoscopic examination and biopsy to evaluate response. -Has follow-up with radiation oncology as well. -Return to care with Dr. Whitney Muse in 3 weeks with repeat labs to monitor thrombocytopenia. -Continue follow-up with Dr. Gwynneth Macleod for management of his chronic kidney disease.  4) h/o Stage IA RLL lung adenocarcinoma in 2012 status post resection by Dr. Arlyce Dice. CT chest without contrast on 12/24/2015 - showed no evidence of recurrent or metastatic disease.  5). Patient Active Problem List   Diagnosis Date Noted  . Subdural hematoma, post-traumatic ([place]) 03/21/2016  . Subdural hematoma ([place]) 03/21/2016  . Cough 10/03/2015  . Allergic rhinitis 10/03/2015  . Tremor, essential 08/27/2015  . History of colonic polyps   . Diverticulosis of colon without hemorrhage   . Essential and other specified forms of tremor 02/23/2013  . Diplopia 02/23/2013  . Diabetic polyneuropathy ([place]) 02/23/2013  . Abnormality of gait 02/23/2013  . COPD  exacerbation ([place]) 01/31/2013  . Fracture of right distal radius 12/14/2012  . Diabetes ([place]) 11/30/2012  . ITP (idiopathic thrombocytopenic purpura) 09/07/2012  . LLQ pain 11/13/2011  . Ventral hernia 10/28/2011  . AAA (abdominal aortic aneurysm) ([place])   . Tobacco abuse   . Diarrhea 10/22/2011  . Tubular adenoma of colon   . Obstructive sleep apnea 03/27/2011  . Fasting hyperglycemia 03/27/2011  . Nephrolithiasis 03/27/2011  . Bladder carcinoma ([place]) 03/27/2011  . COPD (chronic obstructive pulmonary disease) ([place]) 02/24/2011  . Adenocarcinoma of right lung ([place]) 02/24/2011  . Arteriosclerotic cardiovascular disease (ASCVD) 01/06/2011  . Hypertension 01/06/2011  . CKD (chronic kidney disease) stage 3, GFR 30-59 ml/min 01/06/2011   -Continue follow-up with primary care physician Dr. Wolfgang Phoenix for management of other medical comorbidities.   All of the patients questions were answered with apparent satisfaction. The patient knows to call the clinic with any problems, questions or concerns.  I spent 60 minutes counseling the patient face to face. The total time spent in the appointment was 70 minutes and more than 50% was on counseling and direct patient cares.    Sullivan Lone MD [facility] AAHIVMS [facility] Hematology/Oncology Physician [facility]  (Office):       [phone] (Work cell):  [phone] (Fax):           [phone]

## 2016-03-24 NOTE — NC FL2 (Signed)
[place] LEVEL OF CARE SCREENING TOOL     IDENTIFICATION  Patient Name: Kyle [name] Birthdate: July 17, 1934 Sex: male Admission Date (Current Location): 03/21/2016  [facility] and [place] Number:  Herbalist and Address:  [facility] [place] [address] [place], [place] [address]      Provider Number: [id number]  Attending Physician Name and Address:  Mendel Corning, MD  Relative Name and Phone Number:  Blane Ohara, [phone]    Current Level of Care: Hospital Recommended Level of Care: [place] Prior Approval Number:    Date Approved/Denied:   PASRR Number: [id number]  Discharge Plan: SNF    Current Diagnoses: Patient Active Problem List   Diagnosis Date Noted  . Subdural hematoma, post-traumatic ([place]) 03/21/2016  . Subdural hematoma ([place]) 03/21/2016  . Cough 10/03/2015  . Allergic rhinitis 10/03/2015  . Tremor, essential 08/27/2015  . History of colonic polyps   . Diverticulosis of colon without hemorrhage   . Essential and other specified forms of tremor 02/23/2013  . Diplopia 02/23/2013  . Diabetic polyneuropathy ([place]) 02/23/2013  . Abnormality of gait 02/23/2013  . COPD exacerbation ([place]) 01/31/2013  . Fracture of right distal radius 12/14/2012  . Diabetes ([place]) 11/30/2012  . ITP (idiopathic thrombocytopenic purpura) 09/07/2012  . LLQ pain 11/13/2011  . Ventral hernia 10/28/2011  . AAA (abdominal aortic aneurysm) ([place])   . Tobacco abuse   . Diarrhea 10/22/2011  . Tubular adenoma of colon   . Obstructive sleep apnea 03/27/2011  . Fasting hyperglycemia 03/27/2011  . Nephrolithiasis 03/27/2011  . Bladder carcinoma ([place]) 03/27/2011  . COPD (chronic obstructive pulmonary disease) ([place]) 02/24/2011  . Adenocarcinoma of right lung ([place]) 02/24/2011  . Arteriosclerotic cardiovascular disease (ASCVD) 01/06/2011  . Hypertension 01/06/2011  . CKD (chronic kidney disease) stage 3, GFR 30-59 ml/min  01/06/2011    Orientation RESPIRATION BLADDER Height & Weight     Self, Time, Situation, Place  Normal Continent Weight: 120.8 kg (266 lb 5.1 oz) Height:  6' (182.9 cm)  BEHAVIORAL SYMPTOMS/MOOD NEUROLOGICAL BOWEL NUTRITION STATUS      Continent Diet (Please see DC Summary)  AMBULATORY STATUS COMMUNICATION OF NEEDS Skin   Limited Assist Verbally Normal                       Personal Care Assistance Level of Assistance  Bathing, Feeding, Dressing Bathing Assistance: Limited assistance Feeding assistance: Independent Dressing Assistance: Limited assistance     Functional Limitations Info             SPECIAL CARE FACTORS FREQUENCY  PT (By licensed PT)     PT Frequency: min 3x/week              Contractures      Additional Factors Info  Code Status, Allergies, Insulin Sliding Scale Code Status Info: Full Allergies Info: Codeine, Etodolac   Insulin Sliding Scale Info: insulin aspart (novoLOG) injection 0-9 Units       Current Medications (03/24/2016):  This is the current hospital active medication list Current Facility-Administered Medications  Medication Dose Route Frequency Provider Last Rate Last Dose  . acetaminophen (TYLENOL) tablet 500 mg  500 mg Oral Q6H PRN Orvan Falconer, MD   500 mg at 03/24/16 0326  . albuterol (PROVENTIL) (2.5 MG/3ML) 0.083% nebulizer solution 3 mL  3 mL Inhalation Q2H PRN Cherene Altes, MD      . ALPRAZolam Duanne Moron) tablet 0.5 mg  0.5 mg  Oral BID PRN Orvan Falconer, MD   0.5 mg at 03/23/16 2028  . citalopram (CELEXA) tablet 20 mg  20 mg Oral Daily Orvan Falconer, MD   20 mg at 03/23/16 1050  . fentaNYL (SUBLIMAZE) injection 25 mcg  25 mcg Intravenous Q2H PRN Cherene Altes, MD   25 mcg at 03/22/16 1707  . fluticasone (FLONASE) 50 MCG/ACT nasal spray 1 spray  1 spray Each Nare Daily Ripudeep Krystal Eaton, MD   1 spray at 03/23/16 1107  . insulin aspart (novoLOG) injection 0-9 Units  0-9 Units Subcutaneous TID WC Ripudeep Krystal Eaton, MD   1 Units at  03/23/16 1423  . lidocaine-EPINEPHrine (XYLOCAINE W/EPI) 1 %-1:100000 (with pres) injection 10 mL  10 mL Infiltration Once Fredia Sorrow, MD      . loratadine (CLARITIN) tablet 10 mg  10 mg Oral Daily Orvan Falconer, MD   10 mg at 03/23/16 1050  . ondansetron (ZOFRAN) injection 4 mg  4 mg Intravenous Once Dorie Rank, MD      . ondansetron [facility]) injection 4 mg  4 mg Intravenous Q6H PRN Cherene Altes, MD   4 mg at 03/21/16 1854  . pravastatin (PRAVACHOL) tablet 80 mg  80 mg Oral Daily Orvan Falconer, MD   80 mg at 03/23/16 1050  . prochlorperazine (COMPAZINE) injection 10 mg  10 mg Intravenous Q4H PRN Cherene Altes, MD      . tiotropium [facility]) inhalation capsule 18 mcg  18 mcg Inhalation Daily Orvan Falconer, MD      . vitamin B-12 (CYANOCOBALAMIN) tablet 1,000 mcg  1,000 mcg Oral Daily Ripudeep Krystal Eaton, MD   1,000 mcg at 03/23/16 1050     Discharge Medications: Please see discharge summary for a list of discharge medications.  Relevant Imaging Results:  Relevant Lab Results:   Additional Information SSN: [place], [place]

## 2016-03-24 NOTE — Progress Notes (Signed)
Patient will DC to: [facility] (Report: [phone]) Anticipated DC date: 03/24/16 Family notified: Spouse and son Transport by: Family by car   Per MD patient ready for DC to [facility]. RN, patient, patient's family, and facility notified of DC. RN given number for report.  CSW signing off.  Cedric Fishman, [place] Social Worker [phone]

## 2016-03-24 NOTE — Progress Notes (Signed)
Patient to D/C to [facility] with wife and son. Report called. D/C instructions given. Wife and son at the bedside. Dressings changed. Patient D/C to SNF.   Domingo Dimes RN

## 2016-03-25 LAB — GLUCOSE, CAPILLARY: Glucose-Capillary: 96 mg/dL (ref 65–99)

## 2016-03-26 ENCOUNTER — Encounter: Payer: Self-pay | Admitting: Internal Medicine

## 2016-03-26 ENCOUNTER — Non-Acute Institutional Stay (SKILLED_NURSING_FACILITY): Payer: Medicare Other | Admitting: Internal Medicine

## 2016-03-26 DIAGNOSIS — S065XAA Traumatic subdural hemorrhage with loss of consciousness status unknown, initial encounter: Secondary | ICD-10-CM

## 2016-03-26 DIAGNOSIS — J438 Other emphysema: Secondary | ICD-10-CM

## 2016-03-26 DIAGNOSIS — N183 Chronic kidney disease, stage 3 unspecified: Secondary | ICD-10-CM

## 2016-03-26 DIAGNOSIS — I10 Essential (primary) hypertension: Secondary | ICD-10-CM

## 2016-03-26 DIAGNOSIS — J3089 Other allergic rhinitis: Secondary | ICD-10-CM

## 2016-03-26 DIAGNOSIS — G4733 Obstructive sleep apnea (adult) (pediatric): Secondary | ICD-10-CM

## 2016-03-26 DIAGNOSIS — D693 Immune thrombocytopenic purpura: Secondary | ICD-10-CM | POA: Diagnosis not present

## 2016-03-26 DIAGNOSIS — S065X9A Traumatic subdural hemorrhage with loss of consciousness of unspecified duration, initial encounter: Secondary | ICD-10-CM

## 2016-03-26 DIAGNOSIS — E1121 Type 2 diabetes mellitus with diabetic nephropathy: Secondary | ICD-10-CM | POA: Diagnosis not present

## 2016-03-26 DIAGNOSIS — I6200 Nontraumatic subdural hemorrhage, unspecified: Secondary | ICD-10-CM | POA: Diagnosis not present

## 2016-03-26 NOTE — Progress Notes (Signed)
Provider:  Veleta Miners Location:   [facility] Room Number: 121/D Place of Service:  SNF (31)  PCP: Mickie Hillier, MD Patient Care Team: Mikey Kirschner, MD as PCP - General (Family Medicine) Myrlene Broker, MD (Urology) Williams Che, MD (Ophthalmology) Caprice Beaver, DPM (Podiatry) Lavonna Monarch, MD (Dermatology) Provider Not In System Daneil Dolin, MD (Gastroenterology) Melrose Nakayama, MD as Consulting Physician (Cardiothoracic Surgery) Kathee Delton, MD as Consulting Physician (Pulmonary Disease) Herminio Commons, MD as Attending Physician (Cardiology) Jon Billings, RN as [place] Management  Extended Emergency Contact Information Primary Emergency Contact: Orban,[name] D Address: [address]          [place], [place] [address] [name] of [place] Phone: [phone] Mobile Phone: [phone] Relation: Spouse Secondary Emergency Contact: Susman,[name] Address: [address]           [place], [place] [address] [place] Phone: [phone] Mobile Phone: [phone] Relation: Son  Code Status: Full Code Goals of Care: Advanced Directive information Advanced Directives 03/26/2016  Does patient have an advance directive? Yes  Type of Advance Directive (No Data)  Does patient want to make changes to advanced directive? No - Patient declined  Copy of advanced directive(s) in chart? Yes  Pre-existing out of facility DNR order (yellow form or pink MOST form) -      Chief Complaint  Patient presents with  . New Admit To SNF    HPI: Patient is a 82 y.o. male seen today for admission to SNF For Physical therapy. 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 It dose not look like he lost Consciousness. He had Subdural Hematome secondary to fall.Was evaluated by Dr Vertell Limber who  recommended Conservative treatment. He also had possible non displaced Fracture of Nasal bone. His falls  seems to be related to orthostatic hypotension and 2 of his Meds metoprolol and Lisinopril were stopped. Patient doing well. Complaining of Foggy feeling in head. And he says it hurts around frontal area. He also has runny eyes and some congestion with cough. He says tylenol is helping.  Past Medical History:  Diagnosis Date  . AAA (abdominal aortic aneurysm) ([facility]) 2004   s/p repair 2004; 4.3 cm infrarenal in 05/2011  . Abnormality of gait 02/23/2013  . Adenocarcinoma of right lung ([facility]) 02/24/2011   Ct A/P 2012:  2cm lung mass RLL PET 0626:  Hypermetabolic RLL mass, no other hypermetabolic areas. TTNA 02/2011:  Adenocarcinoma, markers c/w lung origin Right lower lobe superior segmentectomy. 04/01/2011 Dr. Arlyce Dice   . Adenocarcinoma, lung ([facility]) 01/2011   transthoracic FNA; resection of the superior segment of the RLL in 03/2011; negative nodes; no chemotherapy nor radiation planned  . Arm fracture    right arm  . Arteriosclerotic cardiovascular disease (ASCVD) 1973, 12/2010   S/P NSTEMI secondary to distal RCA/PL lesion, tx medically.  EF of  55%-60% per  echo.  . Benign prostatic hypertrophy    s/p transurethral resection of the prostate  . Bilateral renal masses    Cystic, more prominent on CT in 12/2010 than 2007; followed by Dr. Rosana Hoes  . Bladder cancer [facility]) 1996   Transurethral resection of the bladder + chemotherapy/BCG as premed  . Cataract   . Chronic kidney disease    Creatinine 1.4 on discharge 12/20/2100; proteinuria; normal renal ultrasound in 2010; recent creatinines of 1.7-2.; Bilateral cystic renal masses by CT in 2011  . COPD (chronic  obstructive pulmonary disease) ([place])   . Coronary artery disease   . Cough    thick phlegm  . Diabetes mellitus    Type II  . Diplopia 02/23/2013  . Diverticulosis   . Essential and other specified forms of tremor 02/23/2013  . Hx of Clostridium difficile infection   . Hyperlipidemia   . Hypertension   . Insomnia   . ITP (idiopathic thrombocytopenic  purpura) 09/07/2012   Chronic ITP of adults versus medication-induced ITP.  Stable  . Myocardial infarction ([facility])   . Nephrolithiasis 2012   ARF in 01/2011 due to obstructing nephrolithiasis  . Obesity   . OSA (obstructive sleep apnea)   . Polyneuropathy in diabetes(357.2) 02/23/2013  . Thrombocytopenia ([facility])   . Tobacco abuse    50-pack-year consumption; quit in 12/2010  . Tubular adenoma of colon   . Ventral hernia    Past Surgical History:  Procedure Laterality Date  . ABDOMINAL AORTIC ANEURYSM REPAIR  2004  . CARDIAC CATHETERIZATION    . COLONOSCOPY  03/19/2010   Dr. Gala Romney -(poor prep) Anal papilla, rectal hyperplastic polyp, tubular adenoma removed splenic flexure, left-sided diverticula  . COLONOSCOPY  11/28/2004   RMR:  Diminutive rectal and left colon polyps as described above, cold  biopsied/removed/  Left sided diverticula. The remainder of the colonic mucosa appeared normal.  . COLONOSCOPY   09/15/01   RMR: Multiple diminutive polyps destroyed with dermolysis as described above/ Multiple small polyps on stalks in the colon resected with snare cautery/ Scattered pan colonic diverticulum/ The remainder of the colonic mucosa appeared normal  . COLONOSCOPY N/A 05/01/2015   Procedure: COLONOSCOPY;  Surgeon: Daneil Dolin, MD;  [facility];  Service: Endoscopy;  Laterality: N/A;  1115  . CYSTECTOMY    . CYSTOSCOPY  04/2014  . CYSTOSTOMY W/ BLADDER BIOPSY    . FLEXIBLE SIGMOIDOSCOPY  2014   Dr. Olevia Perches: tubular adenoma, negative stool studies   . LUNG LOBECTOMY    . TONSILLECTOMY    . TRANSURETHRAL RESECTION OF PROSTATE    . VIDEO BRONCHOSCOPY WITH ENDOBRONCHIAL NAVIGATION N/A 10/10/2013   Procedure: VIDEO BRONCHOSCOPY WITH ENDOBRONCHIAL NAVIGATION;  Surgeon: Melrose Nakayama, MD;  Location: [facility];  Service: Thoracic;  Laterality: N/A;  NO BLOOD THINNERS BUT PATIENT HAS ITP  . WEDGE RESECTION  04/2011   carcinoma of lung    reports that he quit smoking about 3  years ago. His smoking use included Cigarettes. He started smoking about 60 years ago. He has a 50.00 pack-year smoking history. He has never used smokeless tobacco. He reports that he does not drink alcohol or use drugs. Social History   Social History  . Marital status: Married    Spouse name: [name]   . Number of children: 2  . Years of education: 12+   Occupational History  . Retired  [employer]   Social History Main Topics  . Smoking status: Former Smoker    Packs/day: 1.00    Years: 50.00    Types: Cigarettes    Start date: 07/22/1955    Quit date: 12/19/2012  . Smokeless tobacco: Never Used  . Alcohol use No  . Drug use: No  . Sexual activity: No   Other Topics Concern  . Not on file   Social History Narrative   Patient lives at home with his wife [name].    Patient has 2 children.    Patient is retired.    Patient is right handed.  Patient has 2 years of college.    3-4 cups of caffeine daily.       Functional Status Survey:    Family History  Problem Relation Age of Onset  . Aortic aneurysm Mother   . Emphysema Father     smoker  . Clotting disorder Father   . Arthritis Father   . Hypertension Father   . Diabetes Father   . Aortic aneurysm Father   . Tremor Father   . Stroke Paternal Grandmother   . Other Paternal Grandfather     brain aneurysm  . Colon cancer Cousin       Allergies  Allergen Reactions  . Codeine Anaphylaxis  . Etodolac     dizziness      Medication List       Accurate as of 03/26/16 11:47 AM. Always use your most recent med list.          albuterol 108 (90 Base) MCG/ACT inhaler Commonly known as:  PROAIR HFA Inhale 2 puffs into the lungs every 6 (six) hours as needed for wheezing.   ALPRAZolam 0.5 MG tablet Commonly known as:  XANAX TAKE ONE TABLET TWICE DAILY AS NEEDED FOR ANXIETY   calcitRIOL 0.25 MCG capsule Commonly known as:  ROCALTROL Take 0.25 mcg by mouth daily.   cetirizine 10 MG tablet Commonly  known as:  ZYRTEC Take 1 tablet (10 mg total) by mouth daily.   citalopram 20 MG tablet Commonly known as:  CELEXA TAKE ONE (1) TABLET BY MOUTH EVERY DAY   cyanocobalamin 1000 MCG tablet Take 1 tablet (1,000 mcg total) by mouth daily.   fluticasone 50 MCG/ACT nasal spray Commonly known as:  FLONASE Place 1 spray into both nostrils daily.   ipratropium 0.06 % nasal spray Commonly known as:  ATROVENT Place 2 sprays into both nostrils 4 (four) times daily.   niacin 500 MG CR tablet Commonly known as:  NIASPAN Take 2 tablets (1,000 mg total) by mouth at bedtime.   nitroGLYCERIN 0.4 MG SL tablet Commonly known as:  NITROSTAT Place 1 tablet (0.4 mg total) under the tongue every 5 (five) minutes as needed. Call MD if need more than 2   pravastatin 80 MG tablet Commonly known as:  PRAVACHOL TAKE ONE (1) TABLET EACH DAY   senna-docusate 8.6-50 MG tablet Commonly known as:  Senokot-S Take 1 tablet by mouth 2 (two) times daily.   SPIRIVA HANDIHALER 18 MCG inhalation capsule Generic drug:  tiotropium INHALE 1 DOSE BY MOUTH ONCE DAILY   TYLENOL 500 MG tablet Generic drug:  acetaminophen Take 500 mg by mouth every 6 (six) hours as needed for mild pain.       Review of Systems  Constitutional: Negative for appetite change, diaphoresis, fatigue and fever.  HENT: Positive for congestion, postnasal drip, rhinorrhea and sinus pressure.   Eyes: Positive for discharge. Negative for pain, redness and itching.  Respiratory: Positive for cough. Negative for apnea, choking, chest tightness, shortness of breath and wheezing.   Cardiovascular: Negative.   Gastrointestinal: Positive for constipation. Negative for abdominal distention and abdominal pain.  Neurological: Positive for headaches. Negative for dizziness, light-headedness and numbness.    Vitals:   03/26/16 1144  BP: 127/64  Pulse: 100  Resp: 20  Temp: 98.6 F (37 C)  TempSrc: Oral  SpO2: 96%   There is no height or  weight on file to calculate BMI. Physical Exam  Constitutional: He is oriented to person, place, and time. He appears well-developed and  well-nourished.  HENT:  Head: Normocephalic.  Has Stiches in his upper lip.  Eyes: Pupils are equal, round, and reactive to light.  Cardiovascular: Normal rate, regular rhythm and normal heart sounds.   Pulmonary/Chest: Breath sounds normal.  Few rales in the base of lungs  Abdominal: Soft. Bowel sounds are normal. He exhibits no distension. There is no tenderness.  Has abdominal hernia secondary to AAA repair surgery  Musculoskeletal: He exhibits edema.  Neurological: He is alert and oriented to person, place, and time.  Good motor strength In all extremities.  Skin:  2 abrasion in left hand. Lots of bruises in both hands.    Labs reviewed: Basic Metabolic Panel:  Recent Labs  03/22/16 0758 03/23/16 0522 03/24/16 0344  NA 140 143 143  K 4.2 4.0 4.0  CL 108 112* 109  CO2 '28 25 29  '$ GLUCOSE 141* 115* 105*  BUN 21* 21* 24*  CREATININE 1.78* 1.77* 1.86*  CALCIUM 9.1 8.8* 8.9   Liver Function Tests:  Recent Labs  08/27/15 0811 02/22/16 0910 03/21/16 1300  AST '12 14 16  '$ ALT 15 12 12*  ALKPHOS 72 60 50  BILITOT 0.9 1.2 1.4*  PROT 6.7 6.4 6.7  ALBUMIN 4.0 4.2 3.8     CBC:  Recent Labs  03/21/16 1300 03/22/16 0758  WBC 6.6 5.9  NEUTROABS 4.8  --   HGB 13.6 12.8*  HCT 41.2 40.3  MCV 99.5 100.8*  PLT 81* 79*   Cardiac Enzymes:  BNP: Invalid input(s): POCBNP Lab Results  Component Value Date   HGBA1C 5.7 (H) 03/22/2016   Lab Results  Component Value Date   TSH 3.506 01/31/2013   Lab Results  Component Value Date   VITAMINB12 208 03/23/2016   Lab Results  Component Value Date   FOLATE 7.3 03/23/2016   Lab Results  Component Value Date   IRON 51 06/03/2013   TIBC 247 05/26/2011   FERRITIN 282 05/26/2011    Imaging and Procedures obtained prior to SNF admission: Ct Head Wo Contrast  Result Date:  03/22/2016 CLINICAL DATA:  Follow-up exam for acute subdural hematoma. EXAM: CT HEAD WITHOUT CONTRAST TECHNIQUE: Contiguous axial images were obtained from the base of the skull through the vertex without intravenous contrast. COMPARISON:  Prior CT from 03/21/2016. FINDINGS: Brain: Right holo hemispheric subdural hematoma again seen. Overall size of the hematoma is not significantly changed measuring up to 11 mm in maximal thickness at the right parietal convexity. Slight extension along the posterior falx. Extension along the tentorium present as well. Probable trace underlying subarachnoid hemorrhage noted within the right parieto-occipital region. Small left subdural hematoma present as well, most evident over the left frontal convexity were measures up to 4 mm in maximal thickness. This is grossly similar to previous as well. No other acute intracranial hemorrhage. No evidence for acute large vessel territory infarct. No mass effect or midline shift. No hydrocephalus. Stable atrophy with chronic microvascular ischemic disease. Small left periorbital scalp contusion noted. Scalp soft tissues otherwise unremarkable. No acute abnormality about the globes and orbits. Scattered mucosal thickening throughout the paranasal sinuses. Right frontal sinus partially opacified. Possible nasal bone fracture again noted. No mastoid effusion. Calvarium intact. IMPRESSION: 1. No significant interval change in size of right subdural hematoma measuring up to 11 mm in maximal thickness. No significant mass effect or midline shift. 2. Smaller acute left subdural hematoma measuring up to 4 mm in maximal thickness without significant mass effect. This is unchanged from prior study. 3.  No other acute intracranial process identified. Electronically Signed   By: Jeannine Boga M.D.   On: 03/22/2016 06:57   Ct Head Wo Contrast  Result Date: 03/21/2016 CLINICAL DATA:  Fall today. Left facial abrasions. History of colon, long and  bladder cancer. EXAM: CT HEAD WITHOUT CONTRAST CT MAXILLOFACIAL WITHOUT CONTRAST CT CERVICAL SPINE WITHOUT CONTRAST TECHNIQUE: Multidetector CT imaging of the head, cervical spine, and maxillofacial structures were performed using the standard protocol without intravenous contrast. Multiplanar CT image reconstructions of the cervical spine and maxillofacial structures were also generated. COMPARISON:  PET-CT 10/03/2013. FINDINGS: CT HEAD FINDINGS Brain: There is a right hemispheric subdural hematoma layering over the frontal, parietal and temporal lobes. This measures up to 11 mm in thickness over the posterior parietal region. There is no resulting midline shift, intraparenchymal hematoma or hydrocephalus. There is no evidence of acute stroke, brain edema or mass lesion. Vascular: Intracranial vascular calcifications are present. Skull: Negative for fracture or focal lesion. Sinuses/Orbits: See below Other: None. CT MAXILLOFACIAL FINDINGS Mild irregularity of the nasal bones, potentially nondisplaced acute fractures. No other evidence of acute maxillofacial fracture. There is some mucosal thickening in the right frontal and left maxillary sinuses. No air-fluid levels are identified. The mastoid air cells and middle ears are clear. TMJ degenerative changes are present bilaterally. There is mild left supraorbital soft tissue swelling. No evidence of orbital hematoma. The globes are intact. The optic nerves and extraocular muscles appear normal. CT CERVICAL SPINE FINDINGS The alignment is normal. There is no evidence of acute fracture or traumatic subluxation. There is mild spondylosis, primarily at C1-2. No acute soft tissue findings or high-grade spinal stenosis demonstrated. Carotid arterial calcifications are present bilaterally. There is also atherosclerosis of the aortic arch. IMPRESSION: 1. Right hemispheric subdural hematoma as described. No resulting midline shift or hydrocephalus. 2. Possible nondisplaced  nasal bone fractures. No other acute maxillofacial findings. 3. No evidence of acute cervical spine fracture, traumatic subluxation or static signs of instability. 4. Critical Value/emergent results were called by telephone at the time of interpretation on 03/21/2016 at 11:20 am to Dr. Fredia Sorrow , who verbally acknowledged these results. Electronically Signed   By: Richardean Sale M.D.   On: 03/21/2016 11:24   Ct Cervical Spine Wo Contrast  Result Date: 03/21/2016 CLINICAL DATA:  Fall today. Left facial abrasions. History of colon, long and bladder cancer. EXAM: CT HEAD WITHOUT CONTRAST CT MAXILLOFACIAL WITHOUT CONTRAST CT CERVICAL SPINE WITHOUT CONTRAST TECHNIQUE: Multidetector CT imaging of the head, cervical spine, and maxillofacial structures were performed using the standard protocol without intravenous contrast. Multiplanar CT image reconstructions of the cervical spine and maxillofacial structures were also generated. COMPARISON:  PET-CT 10/03/2013. FINDINGS: CT HEAD FINDINGS Brain: There is a right hemispheric subdural hematoma layering over the frontal, parietal and temporal lobes. This measures up to 11 mm in thickness over the posterior parietal region. There is no resulting midline shift, intraparenchymal hematoma or hydrocephalus. There is no evidence of acute stroke, brain edema or mass lesion. Vascular: Intracranial vascular calcifications are present. Skull: Negative for fracture or focal lesion. Sinuses/Orbits: See below Other: None. CT MAXILLOFACIAL FINDINGS Mild irregularity of the nasal bones, potentially nondisplaced acute fractures. No other evidence of acute maxillofacial fracture. There is some mucosal thickening in the right frontal and left maxillary sinuses. No air-fluid levels are identified. The mastoid air cells and middle ears are clear. TMJ degenerative changes are present bilaterally. There is mild left supraorbital soft tissue swelling. No evidence of orbital hematoma.  The  globes are intact. The optic nerves and extraocular muscles appear normal. CT CERVICAL SPINE FINDINGS The alignment is normal. There is no evidence of acute fracture or traumatic subluxation. There is mild spondylosis, primarily at C1-2. No acute soft tissue findings or high-grade spinal stenosis demonstrated. Carotid arterial calcifications are present bilaterally. There is also atherosclerosis of the aortic arch. IMPRESSION: 1. Right hemispheric subdural hematoma as described. No resulting midline shift or hydrocephalus. 2. Possible nondisplaced nasal bone fractures. No other acute maxillofacial findings. 3. No evidence of acute cervical spine fracture, traumatic subluxation or static signs of instability. 4. Critical Value/emergent results were called by telephone at the time of interpretation on 03/21/2016 at 11:20 am to Dr. Fredia Sorrow , who verbally acknowledged these results. Electronically Signed   By: Richardean Sale M.D.   On: 03/21/2016 11:24   Dg Hand Complete Left  Result Date: 03/21/2016 CLINICAL DATA:  Pain following fall EXAM: LEFT HAND - COMPLETE 3+ VIEW COMPARISON:  Left thumb May 28, 2012 FINDINGS: Frontal, oblique, and lateral views were obtained. There is no demonstrable fracture or dislocation. There is mild osteoarthritic change in the first carpal -metacarpal and first IP joints. No erosive changes. No radiopaque foreign body or soft tissue air. IMPRESSION: Areas of mild osteoarthritic change laterally. No fracture or dislocation. No radiopaque foreign body or soft tissue air. Electronically Signed   By: Lowella Grip III M.D.   On: 03/21/2016 10:20   Ct Maxillofacial Wo Cm  Result Date: 03/21/2016 CLINICAL DATA:  Fall today. Left facial abrasions. History of colon, long and bladder cancer. EXAM: CT HEAD WITHOUT CONTRAST CT MAXILLOFACIAL WITHOUT CONTRAST CT CERVICAL SPINE WITHOUT CONTRAST TECHNIQUE: Multidetector CT imaging of the head, cervical spine, and maxillofacial  structures were performed using the standard protocol without intravenous contrast. Multiplanar CT image reconstructions of the cervical spine and maxillofacial structures were also generated. COMPARISON:  PET-CT 10/03/2013. FINDINGS: CT HEAD FINDINGS Brain: There is a right hemispheric subdural hematoma layering over the frontal, parietal and temporal lobes. This measures up to 11 mm in thickness over the posterior parietal region. There is no resulting midline shift, intraparenchymal hematoma or hydrocephalus. There is no evidence of acute stroke, brain edema or mass lesion. Vascular: Intracranial vascular calcifications are present. Skull: Negative for fracture or focal lesion. Sinuses/Orbits: See below Other: None. CT MAXILLOFACIAL FINDINGS Mild irregularity of the nasal bones, potentially nondisplaced acute fractures. No other evidence of acute maxillofacial fracture. There is some mucosal thickening in the right frontal and left maxillary sinuses. No air-fluid levels are identified. The mastoid air cells and middle ears are clear. TMJ degenerative changes are present bilaterally. There is mild left supraorbital soft tissue swelling. No evidence of orbital hematoma. The globes are intact. The optic nerves and extraocular muscles appear normal. CT CERVICAL SPINE FINDINGS The alignment is normal. There is no evidence of acute fracture or traumatic subluxation. There is mild spondylosis, primarily at C1-2. No acute soft tissue findings or high-grade spinal stenosis demonstrated. Carotid arterial calcifications are present bilaterally. There is also atherosclerosis of the aortic arch. IMPRESSION: 1. Right hemispheric subdural hematoma as described. No resulting midline shift or hydrocephalus. 2. Possible nondisplaced nasal bone fractures. No other acute maxillofacial findings. 3. No evidence of acute cervical spine fracture, traumatic subluxation or static signs of instability. 4. Critical Value/emergent results  were called by telephone at the time of interpretation on 03/21/2016 at 11:20 am to Dr. Fredia Sorrow , who verbally acknowledged these results. Electronically Signed  By: Richardean Sale M.D.   On: 03/21/2016 11:24    Assessment/Plan  Subdural Hematoma  Patient Doing well neurologically no change.  Repeat CT scan in 2 weeks before his appointment with Dr Vertell Limber.  Fall precautions Physical therapy Tylenol PRN for Pain.  Essential Hypertension BP is well controlled but patient was little tachycardic. If it continues he will need low dose of lopressor. Vitals Q 4 hours.   COPD with Sleep apnea Patient stable on Spiriva and Flonase. He does have some cough but it seems like that is chronic. Allergic rhinitis  Continue on Flonase and Zyrtec CKD secondary to Diabetes  Creat Stable at 1.86. Repeat labs in 1 week.  Diabetes type2 not on any hypoglycemics  BS running in 140-180. Last HGA1c was 5.7 Continue accu check BID.  ITP Platelet count is low. With his SDH his aspirin is on hold. Repeat CBC as platelets have been drifting down.  Constipation Will try Suppository and Miralax. Already on senna.    Family/ staff Communication:   Labs/tests ordered:

## 2016-03-27 ENCOUNTER — Encounter (HOSPITAL_COMMUNITY)
Admission: RE | Admit: 2016-03-27 | Discharge: 2016-03-27 | Disposition: A | Discharge status: Home / Self Care | Payer: Medicare Other | Source: Skilled Nursing Facility | Attending: Internal Medicine | Admitting: Internal Medicine

## 2016-03-27 ENCOUNTER — Other Ambulatory Visit: Payer: Self-pay

## 2016-03-27 LAB — CBC WITH DIFFERENTIAL/PLATELET
BASOS ABS: 0 10*3/uL (ref 0.0–0.1)
Basophils Relative: 0 %
EOS ABS: 0.2 10*3/uL (ref 0.0–0.7)
Eosinophils Relative: 5 %
HCT: 38 % — ABNORMAL LOW (ref 39.0–52.0)
Hemoglobin: 12.3 g/dL — ABNORMAL LOW (ref 13.0–17.0)
Lymphocytes Relative: 24 %
Lymphs Abs: 1.1 10*3/uL (ref 0.7–4.0)
MCH: 31.8 pg (ref 26.0–34.0)
MCHC: 32.4 g/dL (ref 30.0–36.0)
MCV: 98.2 fL (ref 78.0–100.0)
Monocytes Absolute: 0.4 10*3/uL (ref 0.1–1.0)
Monocytes Relative: 9 %
NEUTROS PCT: 63 %
Neutro Abs: 2.8 10*3/uL (ref 1.7–7.7)
Platelets: 75 10*3/uL — ABNORMAL LOW (ref 150–400)
RBC: 3.87 MIL/uL — AB (ref 4.22–5.81)
RDW: 15.3 % (ref 11.5–15.5)
WBC: 4.5 10*3/uL (ref 4.0–10.5)

## 2016-03-27 NOTE — Patient Outreach (Signed)
[facility]) Care Management  03/27/2016  Kyle [name] 1934/06/14 [id number]   Patient was admitted for fall late last week.  Patient sent to [facility] for physical therapy.  Will send order for social work to follow.  Will send physician discipline closure letter.  [name], RN, MSN [place] [phone]

## 2016-03-28 ENCOUNTER — Encounter (HOSPITAL_COMMUNITY): Payer: Self-pay | Admitting: Hematology & Oncology

## 2016-03-28 ENCOUNTER — Encounter (HOSPITAL_COMMUNITY): Payer: Medicare Other | Attending: Hematology & Oncology | Admitting: Hematology & Oncology

## 2016-03-28 ENCOUNTER — Encounter (HOSPITAL_COMMUNITY): Payer: Medicare Other

## 2016-03-28 VITALS — BP 130/70 | HR 85 | Temp 97.7°F | Resp 18

## 2016-03-28 DIAGNOSIS — C679 Malignant neoplasm of bladder, unspecified: Secondary | ICD-10-CM | POA: Diagnosis not present

## 2016-03-28 DIAGNOSIS — D696 Thrombocytopenia, unspecified: Secondary | ICD-10-CM | POA: Diagnosis not present

## 2016-03-28 DIAGNOSIS — S065XAA Traumatic subdural hemorrhage with loss of consciousness status unknown, initial encounter: Secondary | ICD-10-CM

## 2016-03-28 DIAGNOSIS — Z720 Tobacco use: Secondary | ICD-10-CM

## 2016-03-28 DIAGNOSIS — S065X9A Traumatic subdural hemorrhage with loss of consciousness of unspecified duration, initial encounter: Secondary | ICD-10-CM

## 2016-03-28 DIAGNOSIS — C3491 Malignant neoplasm of unspecified part of right bronchus or lung: Secondary | ICD-10-CM | POA: Insufficient documentation

## 2016-03-28 LAB — COMPREHENSIVE METABOLIC PANEL
ALBUMIN: 3.8 g/dL (ref 3.5–5.0)
ALT: 14 U/L — AB (ref 17–63)
AST: 14 U/L — AB (ref 15–41)
Alkaline Phosphatase: 57 U/L (ref 38–126)
Anion gap: 9 (ref 5–15)
BILIRUBIN TOTAL: 1.1 mg/dL (ref 0.3–1.2)
BUN: 30 mg/dL — AB (ref 6–20)
CHLORIDE: 105 mmol/L (ref 101–111)
CO2: 26 mmol/L (ref 22–32)
CREATININE: 1.87 mg/dL — AB (ref 0.61–1.24)
Calcium: 9.2 mg/dL (ref 8.9–10.3)
GFR calc Af Amer: 38 mL/min — ABNORMAL LOW (ref >60)
GFR, EST NON AFRICAN AMERICAN: 33 mL/min — AB (ref >60)
GLUCOSE: 203 mg/dL — AB (ref 65–99)
POTASSIUM: 4 mmol/L (ref 3.5–5.1)
Sodium: 140 mmol/L (ref 135–145)
TOTAL PROTEIN: 6.6 g/dL (ref 6.5–8.1)

## 2016-03-28 LAB — CBC WITH DIFFERENTIAL/PLATELET
BASOS ABS: 0 10*3/uL (ref 0.0–0.1)
BASOS PCT: 0 %
Eosinophils Absolute: 0.2 10*3/uL (ref 0.0–0.7)
Eosinophils Relative: 4 %
HEMATOCRIT: 40.2 % (ref 39.0–52.0)
Hemoglobin: 13.2 g/dL (ref 13.0–17.0)
LYMPHS PCT: 20 %
Lymphs Abs: 1.1 10*3/uL (ref 0.7–4.0)
MCH: 32.4 pg (ref 26.0–34.0)
MCHC: 32.8 g/dL (ref 30.0–36.0)
MCV: 98.5 fL (ref 78.0–100.0)
Monocytes Absolute: 0.3 10*3/uL (ref 0.1–1.0)
Monocytes Relative: 5 %
NEUTROS ABS: 4 10*3/uL (ref 1.7–7.7)
NEUTROS PCT: 71 %
Platelets: 87 10*3/uL — ABNORMAL LOW (ref 150–400)
RBC: 4.08 MIL/uL — AB (ref 4.22–5.81)
RDW: 15.2 % (ref 11.5–15.5)
WBC: 5.5 10*3/uL (ref 4.0–10.5)

## 2016-03-28 NOTE — Patient Instructions (Addendum)
[place] at [facility] Discharge Instructions  RECOMMENDATIONS MADE BY THE CONSULTANT AND ANY TEST RESULTS WILL BE SENT TO YOUR REFERRING PHYSICIAN.  You saw Dr. Whitney Muse today. Lab work only in 2 months. Follow up in 4 months with lab work also.  Thank you for choosing [place] at [facility] to provide your oncology and hematology care.  To afford each patient quality time with our provider, please arrive at least 15 minutes before your scheduled appointment time.   Beginning January 23rd 2017 lab work for the [facility] will be done in the  Main lab at [facility] on 1st floor. If you have a lab appointment with the [facility] please come in thru the  Main Entrance and check in at the main information desk  You need to re-schedule your appointment should you arrive 10 or more minutes late.  We strive to give you quality time with our providers, and arriving late affects you and other patients whose appointments are after yours.  Also, if you no show three or more times for appointments you may be dismissed from the clinic at the providers discretion.     Again, thank you for choosing [facility].  Our hope is that these requests will decrease the amount of time that you wait before being seen by our physicians.       _____________________________________________________________  Should you have questions after your visit to [facility], please contact our office at [phone] between the hours of 8:30 a.m. and 4:30 p.m.  Voicemails left after 4:30 p.m. will not be returned until the following business day.  For prescription refill requests, have your pharmacy contact our office.         Resources For Cancer Patients and their Caregivers ? American Cancer Society: Can assist with transportation, wigs, general needs, runs Look Good Feel Better.        [phone] ? Cancer Care: Provides financial  assistance, online support groups, medication/co-pay assistance.  1-800-813-HOPE 204-803-8478) ? [place] Assists [place] cancer patients and their families through emotional , educational and financial support.  (602)050-1578 ? [place] DSS Where to apply for food stamps, Medicaid and utility assistance. (858)664-9252 ? [employer]: Transportation to medical appointments. 8572626948 ? Social Security Administration: May apply for disability if have a Stage IV cancer. 445-647-0499 587-100-8331 ? [employer], Disability and Transit Services: Assists with nutrition, care and transit needs. [place] Support Programs: '@10RELATIVEDAYS'$ @ > Cancer Support Group  2nd Tuesday of the month 1pm-2pm, Journey Room  > Creative Journey  3rd Tuesday of the month 1130am-1pm, Journey Room  > Look Good Feel Better  1st Wednesday of the month 10am-12 noon, Journey Room (Call [facility] to register [phone])

## 2016-03-28 NOTE — Progress Notes (Signed)
[place] at [place], [place] [address]  Stage IA moderately differentiated adenocarcinoma of the lung Thrombocytopenia, chronic Bladder Carcinoma    Adenocarcinoma of right lung ([place])   04/03/2011 Initial Diagnosis    INVASIVE MODERATELY DIFFERENTIATED ADENOCARCINOMA, 2.3 CM. (T1b, N0).  Clear margins, no LVI, no pleural involvement. 0/6 nodes.      04/03/2011 Surgery    Right lower lobe superior segmentectomy by Dr. Arlyce Dice      04/04/2011 Remission         03/07/2013 Imaging    CT of chest-  developing adenopathy in the subcarinal and azygo-esophageal recess stations, mild.  Findings are suspicious for recurrent or localized metastatic disease.       03/16/2013 Imaging    PET scan- No definite metabolically active pulmonary lesion and no mediastinal or hilar lymphadenopathy.        06/15/2013 Imaging    CT scan of chest- Ill-defined pulmonary density in RLL in the region of patient's prior surgery, less prominent than on prior CT obtained for PET-CT of 02/25/2013. This is most likely region ofscarring. Underlying tumor cannot be excluded      09/19/2013 Imaging    CT of chest- Progressive volume loss medially at the right lung base. Differential diagnostic considerations include mucus plugging, radiation therapy related findings, or progressive bronchogenic carcinoma      10/03/2013 Imaging    PET scan- Low level residual FDG uptake in the area of previous surgery with moderate atelectasis. I do not see any findings suspicious for recurrent or residual tumor.      10/10/2013 Procedure    Bronchcospy with biopsies and brushings by Dr. Roxan Hockey      10/10/2013 Pathology Results    RLL biopsies are negative for malignancy      12/24/2015 Imaging    CT chest- Posttreatment changes in the right lower lobe with associated volume loss, stable. No evidence of recurrent or metastatic disease.       Bladder carcinoma ([place])   03/27/2011 Initial Diagnosis    Bladder carcinoma       - 10/05/2013 Chemotherapy    Completed continuous bladder infusion chemotherapy at [facility] -- mitomycin C      09/05/2015 Imaging    CT C/A/P Stable noncontrast CT appearance of the prostate gland. Scarring and/or atelectasis in the right lower lobe, reduced prominence compared to prior exam. Right adrenal adenoma.  Stable nonspecific left adrenal nodule. Bilateral nonobstructive nephrolithiasis. Multiple exophytic fluid density lesions of the kidneys compatible with cysts. Mildly complex exophytic lesion of the right kidney lower pole posteriorly was not previously hypermetabolic on PET-CT.      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 Tressie Stalker at [facility]: Surveillance per NCCN guidelines.  INTERVAL HISTORY: Kyle [name] 82 y.o. male returns for  regular  visit for followup of Stage I a (T1 B., N0, M0) moderately differentiated adenocarcinoma the lung, 2.3 cm in size with surgery on 04/03/2011. Margins were clear, no LV I was seen,  and there is no pleural involvement. 6 lymph nodes were all negative getting stage IA disease.  AND Thrombocytopenia thus far consistent with chronic ITP of adults versus drug-induced mild thrombocytopenia. He is on too many medications to withdraw them completely. There is no evidence on imaging of splenomegaly or cirrhosis.   He has just completed concurrent chemoradiation at [facility] for bladder cancer with the last radiation session being on 12/20/2015.   He says that Dr. Lawerance Bach is his urologist, at [facility].  Kyle [name] is accompanied by his wife and presents in wheelchair.  I  personally reviewed and went over laboratory studies.  Since our last visit, the patient fell and was admitted to the hospital 03/21/2016 to 03/24/2016 with a subdural hematoma. He is now at the [facility] for rehab.   He recently took a Tylenol to decrease what feels like a sinus headache which radiates from his head down to his eyes. This is mostly likely secondary to injury from his recent fall.  His appetite is better. He denies hematuria. He is sleeping, however last night he did not take his Xanax and his wife notes he seems more aware today. He is doing physical therapy with the [facility].   His wife believes he is scheduled to follow with urology in 2 months. Last imaging of the chest was in June and without evidence of recurrence.    Past Medical History:  Diagnosis Date  . AAA (abdominal aortic aneurysm) ([place]) 2004   s/p repair 2004; 4.3 cm infrarenal in 05/2011  . Abnormality of gait 02/23/2013  . Adenocarcinoma of right lung ([place]) 02/24/2011   Ct A/P 2012:  2cm lung mass RLL PET 6948:  Hypermetabolic RLL mass, no other hypermetabolic areas. TTNA 02/2011:  Adenocarcinoma, markers c/w lung origin Right lower lobe superior segmentectomy. 04/01/2011 Dr. Arlyce Dice   . Adenocarcinoma, lung ([place]) 01/2011   transthoracic FNA; resection of the superior segment of the RLL in 03/2011; negative nodes; no chemotherapy nor radiation planned  . Arm fracture    right arm  . Arteriosclerotic cardiovascular disease (ASCVD) 1973, 12/2010   S/P NSTEMI secondary to distal RCA/PL lesion, tx medically.  EF of  55%-60% per  echo.  . Benign prostatic hypertrophy    s/p transurethral resection of the prostate  . Bilateral renal masses    Cystic, more prominent on CT in 12/2010 than 2007; followed by Dr. Rosana Hoes  . Bladder cancer [facility]) 1996   Transurethral resection of the bladder + chemotherapy/BCG as premed  . Cataract   . Chronic kidney disease    Creatinine 1.4 on discharge 12/20/2100; proteinuria; normal renal  ultrasound in 2010; recent creatinines of 1.7-2.; Bilateral cystic renal masses by CT in 2011  . COPD (chronic obstructive pulmonary disease) ([place])   . Coronary artery disease   . Cough    thick phlegm  . Diabetes mellitus    Type II  . Diplopia 02/23/2013  . Diverticulosis   . Essential and other specified forms of tremor 02/23/2013  . Hx of Clostridium difficile infection   . Hyperlipidemia   . Hypertension   . Insomnia   . ITP (idiopathic thrombocytopenic purpura) 09/07/2012   Chronic ITP of adults versus medication-induced ITP.  Stable  . Myocardial infarction ([place])   . Nephrolithiasis 2012   ARF in 01/2011 due to obstructing nephrolithiasis  . Obesity   . OSA (obstructive sleep apnea)   . Polyneuropathy in diabetes(357.2) 02/23/2013  . Thrombocytopenia ([place])   . Tobacco  abuse    50-pack-year consumption; quit in 12/2010  . Tubular adenoma of colon   . Ventral hernia     has Arteriosclerotic cardiovascular disease (ASCVD); Hypertension; CKD (chronic kidney disease) stage 3, GFR 30-59 ml/min; COPD (chronic obstructive pulmonary disease) ([place]); Adenocarcinoma of right lung ([place]); Obstructive sleep apnea; Fasting hyperglycemia; Nephrolithiasis; Bladder carcinoma ([place]); Diarrhea; Tubular adenoma of colon; Ventral hernia; AAA (abdominal aortic aneurysm) ([place]); Tobacco abuse; LLQ pain; ITP (idiopathic thrombocytopenic purpura); Diabetes ([place]); Fracture of right distal radius; COPD exacerbation ([place]); Essential and other specified forms of tremor; Diplopia; Diabetic polyneuropathy ([place]); Abnormality of gait; History of colonic polyps; Diverticulosis of colon without hemorrhage; Tremor, essential; Cough; Allergic rhinitis; Subdural hematoma, post-traumatic (HCC); and Subdural hematoma (HCC) on his problem list.     is allergic to codeine and etodolac.  Kyle [name] had no medications administered during this visit.  Past Surgical History:  Procedure Laterality Date  . ABDOMINAL AORTIC ANEURYSM  REPAIR  2004  . CARDIAC CATHETERIZATION    . COLONOSCOPY  03/19/2010   Dr. Gala Romney -(poor prep) Anal papilla, rectal hyperplastic polyp, tubular adenoma removed splenic flexure, left-sided diverticula  . COLONOSCOPY  11/28/2004   RMR:  Diminutive rectal and left colon polyps as described above, cold  biopsied/removed/  Left sided diverticula. The remainder of the colonic mucosa appeared normal.  . COLONOSCOPY   09/15/01   RMR: Multiple diminutive polyps destroyed with dermolysis as described above/ Multiple small polyps on stalks in the colon resected with snare cautery/ Scattered pan colonic diverticulum/ The remainder of the colonic mucosa appeared normal  . COLONOSCOPY N/A 05/01/2015   Procedure: COLONOSCOPY;  Surgeon: Daneil Dolin, MD;  Location: [facility];  Service: Endoscopy;  Laterality: N/A;  1115  . CYSTECTOMY    . CYSTOSCOPY  04/2014  . CYSTOSTOMY W/ BLADDER BIOPSY    . FLEXIBLE SIGMOIDOSCOPY  2014   Dr. Olevia Perches: tubular adenoma, negative stool studies   . LUNG LOBECTOMY    . TONSILLECTOMY    . TRANSURETHRAL RESECTION OF PROSTATE    . VIDEO BRONCHOSCOPY WITH ENDOBRONCHIAL NAVIGATION N/A 10/10/2013   Procedure: VIDEO BRONCHOSCOPY WITH ENDOBRONCHIAL NAVIGATION;  Surgeon: Melrose Nakayama, MD;  Location: [place];  Service: Thoracic;  Laterality: N/A;  NO BLOOD THINNERS BUT PATIENT HAS ITP  . WEDGE RESECTION  04/2011   carcinoma of lung    Denies any headaches, dizziness, double vision, fevers, chills, night sweats, nausea, vomiting, diarrhea, chest pain, heart palpitations, shortness of breath, blood in stool, black tarry stool, urinary pain, urinary burning, urinary frequency, hematuria. Positive for joint pain. 14 point review of systems was performed and is negative except as detailed under history of present illness and above   PHYSICAL EXAMINATION   Vitals with BMI 03/28/2016  Height   Weight   BMI   Systolic 297  Diastolic 70  Pulse 85  Respirations 18   ECOG  PERFORMANCE STATUS: 2 - Symptomatic, <50% confined to bed  GENERAL:alert, no distress, well nourished, well developed, comfortable, cooperative, obese and smiling. In wheelchair SKIN: skin color, texture, turgor are normal, no rashes or significant lesions HEAD: Normocephalic, No masses, lesions, tenderness or abnormalities some bruising of the face noted, but healing EYES: normal, PERRLA, EOMI, Conjunctiva are pink and non-injected EARS: External ears normal OROPHARYNX:mucous membranes are moist  NECK: supple, trachea midline LYMPH:  No axillary adenopathy, no adenopathy in the neck or supraclavicular areas LUNGS: clear to auscultation  HEART: regular rate & rhythm, no murmurs and no gallops  ABDOMEN:obese Large abdominal wall Hernia, easily reduced. BACK: Back symmetric, no curvature. EXTREMITIES:less then 2 second capillary refill, no skin discoloration, no cyanosis  NEURO: alert & oriented x 3 with fluent speech, gait not assessed   LABORATORY DATA: I have reviewed the data as listed. Results for Kyle, [name] (MRN [id number]) as of 03/28/2016 13:15  Ref. Range 03/27/2016 07:50 03/28/2016 12:37  WBC Latest Ref Range: 4.0 - 10.5 K/uL 4.5 5.5  RBC Latest Ref Range: 4.22 - 5.81 MIL/uL 3.87 (L) 4.08 (L)  Hemoglobin Latest Ref Range: 13.0 - 17.0 g/dL 12.3 (L) 13.2  HCT Latest Ref Range: 39.0 - 52.0 % 38.0 (L) 40.2  MCV Latest Ref Range: 78.0 - 100.0 fL 98.2 98.5  MCH Latest Ref Range: 26.0 - 34.0 pg 31.8 32.4  MCHC Latest Ref Range: 30.0 - 36.0 g/dL 32.4 32.8  RDW Latest Ref Range: 11.5 - 15.5 % 15.3 15.2  Platelets Latest Ref Range: 150 - 400 K/uL 75 (L) 87 (L)  Neutrophils Latest Units: % 63 71  Lymphocytes Latest Units: % 24 20  Monocytes Relative Latest Units: % 9 5  Eosinophil Latest Units: % 5 4  Basophil Latest Units: % 0 0  NEUT# Latest Ref Range: 1.7 - 7.7 K/uL 2.8 4.0  Lymphocyte # Latest Ref Range: 0.7 - 4.0 K/uL 1.1 1.1  Monocyte # Latest Ref Range: 0.1 - 1.0 K/uL 0.4 0.3    Eosinophils Absolute Latest Ref Range: 0.0 - 0.7 K/uL 0.2 0.2  Basophils Absolute Latest Ref Range: 0.0 - 0.1 K/uL 0.0 0.0    RADIOLOGY: I have reviewed the images below and agree with the reported results Study Result   CLINICAL DATA:  Follow-up exam for acute subdural hematoma.  EXAM: CT HEAD WITHOUT CONTRAST  TECHNIQUE: Contiguous axial images were obtained from the base of the skull through the vertex without intravenous contrast.  COMPARISON:  Prior CT from 03/21/2016.  FINDINGS: Brain: Right holo hemispheric subdural hematoma again seen. Overall size of the hematoma is not significantly changed measuring up to 11 mm in maximal thickness at the right parietal convexity. Slight extension along the posterior falx. Extension along the tentorium present as well. Probable trace underlying subarachnoid hemorrhage noted within the right parieto-occipital region.  Small left subdural hematoma present as well, most evident over the left frontal convexity were measures up to 4 mm in maximal thickness. This is grossly similar to previous as well.  No other acute intracranial hemorrhage. No evidence for acute large vessel territory infarct. No mass effect or midline shift. No hydrocephalus. Stable atrophy with chronic microvascular ischemic disease.  Small left periorbital scalp contusion noted. Scalp soft tissues otherwise unremarkable. No acute abnormality about the globes and orbits.  Scattered mucosal thickening throughout the paranasal sinuses. Right frontal sinus partially opacified. Possible nasal bone fracture again noted. No mastoid effusion.  Calvarium intact.  IMPRESSION: 1. No significant interval change in size of right subdural hematoma measuring up to 11 mm in maximal thickness. No significant mass effect or midline shift. 2. Smaller acute left subdural hematoma measuring up to 4 mm in maximal thickness without significant mass effect. This is  unchanged from prior study. 3. No other acute intracranial process identified.   Electronically Signed   By: Jeannine Boga M.D.   On: 03/22/2016 06:57    PATHOLOGY:  10/10/2013  Diagnosis 1. Lung, biopsy, RLL - BENIGN LUNG TISSUE, SEE COMMENT. - NEGATIVE FOR ATYPIA OR MALIGNANCY. 2. Lung, biopsy, RLL - BENIGN LUNG TISSUE, SEE COMMENT. -  NEGATIVE FOR ATYPIA OR MALIGNANCY. Microscopic Comment 1. Multiple biopsies demonstrate non neoplastic lung with reactive bronchial respiratory-type epithelium overlying thickened basement membrane. Within the subepithelial tissue, there is prominent lymphoplasmacytic inflammation and edema. There are no features of epithelial dysplasia or malignancy present. The case was reviewed with Dr. Gari Crown who concurs. 2. Multiple biopsies demonstrate non neoplastic lung with reactive bronchial respiratory-type epithelium overlying thickened basement. Within the subepithelial tissue, there is chronic lymphoplasmacytic inflammation, fibroelastotic stromal change, and edema. There is incidental oxyntic metaplasia of benign salivary-type glands. There are no features of epithelial dysplasia or malignancy present. (CRR:gt, 10/12/13) [place] RUND DO Pathologist, Electronic Signature (Case signed 10/12/2013)    ASSESSMENT:  1. Stage I a (T1 B., N0, M0) moderately differentiated adenocarcinoma the lung, 2.3 cm in size with surgery on 04/03/2011. Margins were clear, no LV I was seen, and there is no pleural involvement. 6 lymph nodes were all negative getting stage IA disease.  2. Thrombocytopenia thus far consistent with chronic ITP of adults versus drug-induced mild thrombocytopenia. He is on too many medications to withdraw them completely. There is no evidence on his May 2013 CT scan of splenomegaly or cirrhosis. Stable.  3. Obesity  4. Intermittent diarrhea treated by GI  5. Bladder cancer followed by Dr. Rosana Hoes, s/p concurrent chemoxrt at [facility]  with Dr. Tressie Stalker 6. MI x2 the last in May 2012  7. Abdominal hernia secondary to abdominal aortic aneurysm surgery in 2008  8. Double vision in the past which has not been a recent issue  9. History of skin cancer  10. Chronic renal disease, grade 3, followed by nephrologist  11. S/P bronchoscopy by Dr. Roxan Hockey on 10/10/2013 with biopsies and they are negative. 12. Fall/subdural hematoma  Patient Active Problem List   Diagnosis Date Noted  . Subdural hematoma, post-traumatic ([place]) 03/21/2016  . Subdural hematoma ([place]) 03/21/2016  . Cough 10/03/2015  . Allergic rhinitis 10/03/2015  . Tremor, essential 08/27/2015  . History of colonic polyps   . Diverticulosis of colon without hemorrhage   . Essential and other specified forms of tremor 02/23/2013  . Diplopia 02/23/2013  . Diabetic polyneuropathy ([place]) 02/23/2013  . Abnormality of gait 02/23/2013  . COPD exacerbation ([place]) 01/31/2013  . Fracture of right distal radius 12/14/2012  . Diabetes ([place]) 11/30/2012  . ITP (idiopathic thrombocytopenic purpura) 09/07/2012  . LLQ pain 11/13/2011  . Ventral hernia 10/28/2011  . AAA (abdominal aortic aneurysm) ([place])   . Tobacco abuse   . Diarrhea 10/22/2011  . Tubular adenoma of colon   . Obstructive sleep apnea 03/27/2011  . Fasting hyperglycemia 03/27/2011  . Nephrolithiasis 03/27/2011  . Bladder carcinoma ([place]) 03/27/2011  . COPD (chronic obstructive pulmonary disease) ([place]) 02/24/2011  . Adenocarcinoma of right lung ([place]) 02/24/2011  . Arteriosclerotic cardiovascular disease (ASCVD) 01/06/2011  . Hypertension 01/06/2011  . CKD (chronic kidney disease) stage 3, GFR 30-59 ml/min 01/06/2011     PLAN:   I reviewed the CT scans with the patient.  We will continue with ongoing CT surveillance as recommended in accordance with the NCCN guidelines.  NCCN guidelines for Non-Small Cell Lung Cancer Surveillance in the setting of clinical/radiographic remission are as follows  (4.2017):  A. Stage I-II (primary treatment included surgery +/- chemotherapy):   1. H+P and chest CT +/- contrast every 6 months for 2-3 years, then H+P and low-dose non-contrast-enhanced chest CT annually   We addressed his ongoing tobacco use and its relationship to his bladder cancer and lung cancer. He  is aware of this but is not interested in discontinuing smoking.However, since being in the [facility] he has discontinued smoking.   In regards to his chronic thrombocytopenia, counts are stable.  Since our last visit, the patient fell and was admitted to the hospital 03/21/2016 to 03/24/2016 with a subdural hematoma.   RTC a little over 3 months with labs.   We discussed follow-up of his urothelial carcinoma and I advised him that he will need repeat cystoscopic examination and bladder and urethral biopsy.    Orders Placed This Encounter  Procedures  . CBC with Differential    Standing Status:   Future    Standing Expiration Date:   03/28/2017  . CBC with Differential    Standing Status:   Future    Standing Expiration Date:   03/28/2017  . Comprehensive metabolic panel    Standing Status:   Future    Standing Expiration Date:   03/28/2017   All questions were answered. The patient knows to call the clinic with any problems, questions or concerns. We can certainly see the patient much sooner if necessary.  This document serves as a record of services personally performed by Ancil Linsey, MD. It was created on her behalf by Arlyce Harman, a trained medical scribe. The creation of this record is based on the scribe's personal observations and the provider's statements to them. This document has been checked and approved by the attending provider.  I have reviewed the above documentation for accuracy and completeness, and I agree with the above.  This note was electronically signed. Molli Hazard, MD  03/29/2016

## 2016-03-29 ENCOUNTER — Encounter (HOSPITAL_COMMUNITY): Payer: Self-pay | Admitting: Hematology & Oncology

## 2016-03-29 ENCOUNTER — Non-Acute Institutional Stay (SKILLED_NURSING_FACILITY): Payer: Medicare Other | Admitting: Internal Medicine

## 2016-03-29 DIAGNOSIS — I10 Essential (primary) hypertension: Secondary | ICD-10-CM

## 2016-03-29 DIAGNOSIS — D696 Thrombocytopenia, unspecified: Secondary | ICD-10-CM | POA: Diagnosis not present

## 2016-03-29 DIAGNOSIS — R609 Edema, unspecified: Secondary | ICD-10-CM

## 2016-03-29 NOTE — Progress Notes (Signed)
This is an acute visit.  Level care skilled.  Facility [facility].  Chief complaint acute visit follow-up possible edema-thrombocytopenia-hypertension.  History of present illness.  Patient is a pleasant 82 year old male here for rehabilitation after sustaining a subdural hematoma secondary to a fall.  This is being treated conservatively per neurosurgical evaluation.  Also had a possible nondisplaced fracture of the nasal bone.  Was thought his falls might be related to orthostatic hypertension and his metoprolol lisinopril were stopped.  He appears to be doing well ambulating about facility in his wheelchair.  He does not report any dizziness currently.  Blood pressures appear to be relatively stable 139/80 this morning it was 155/71 later today but according in nursing there are not consistent systolic elevations although this will have to be monitored.  Neurologically appears to be intact he does not complain of any headache dizziness or syncopal-type feelings or increased weakness from baseline.  His wife is concerned today about some possible edema apparently he had been up for some time and had edema in his lower extremities-I did review his chart he did have a cardiac echo done on 03/22/2016 which showed an ejection fraction of 50-55 percent with grade 1 diastolic dysfunction he is not currently on a diuretic.  I have reviewed his weights and this actually stable with weight of 260.4 which is stable with his admission weight.  He also has a history of essential thrombocytopenia this is followed by hematology actually he saw hematology yesterday and thought to be doing well platelets of 87,000 on lab done yesterday appear to be relatively baseline Continues with somewhat chronic bruising especially upper extremities which is not new  He is not complaining of any shortness of breath or chest pain.      Past Medical History:  Diagnosis Date  . AAA  (abdominal aortic aneurysm) ([place]) 2004   s/p repair 2004; 4.3 cm infrarenal in 05/2011  . Abnormality of gait 02/23/2013  . Adenocarcinoma of right lung ([place]) 02/24/2011   Ct A/P 2012: 2cm lung mass RLL PET 8338: Hypermetabolic RLL mass, no other hypermetabolic areas. TTNA 02/2011: Adenocarcinoma, markers c/w lung origin Right lower lobe superior segmentectomy. 04/01/2011 Dr. Arlyce Dice   . Adenocarcinoma, lung ([place]) 01/2011   transthoracic FNA; resection of the superior segment of the RLL in 03/2011; negative nodes; no chemotherapy nor radiation planned  . Arm fracture    right arm  . Arteriosclerotic cardiovascular disease (ASCVD) 1973, 12/2010   S/P NSTEMI secondary to distal RCA/PL lesion, tx medically. EF of 55%-60% per echo.  . Benign prostatic hypertrophy    s/p transurethral resection of the prostate  . Bilateral renal masses    Cystic, more prominent on CT in 12/2010 than 2007; followed by Dr. Rosana Hoes  . Bladder cancer [facility]) 1996   Transurethral resection of the bladder + chemotherapy/BCG as premed  . Cataract   . Chronic kidney disease    Creatinine 1.4 on discharge 12/20/2100; proteinuria; normal renal ultrasound in 2010; recent creatinines of 1.7-2.; Bilateral cystic renal masses by CT in 2011  . COPD (chronic obstructive pulmonary disease) ([place])   . Coronary artery disease   . Cough    thick phlegm  . Diabetes mellitus    Type II  . Diplopia 02/23/2013  . Diverticulosis   . Essential and other specified forms of tremor 02/23/2013  . Hx of Clostridium difficile infection   . Hyperlipidemia   . Hypertension   . Insomnia   . ITP (idiopathic thrombocytopenic  purpura) 09/07/2012   Chronic ITP of adults versus medication-induced ITP. Stable  . Myocardial infarction ([place])   . Nephrolithiasis 2012   ARF in 01/2011 due to obstructing nephrolithiasis  . Obesity   . OSA (obstructive sleep apnea)   . Polyneuropathy in diabetes(357.2) 02/23/2013  . Thrombocytopenia  ([place])   . Tobacco abuse    50-pack-year consumption; quit in 12/2010  . Tubular adenoma of colon   . Ventral hernia         Past Surgical History:  Procedure Laterality Date  . ABDOMINAL AORTIC ANEURYSM REPAIR  2004  . CARDIAC CATHETERIZATION    . COLONOSCOPY  03/19/2010   Dr. Gala Romney -(poor prep) Anal papilla, rectal hyperplastic polyp, tubular adenoma removed splenic flexure, left-sided diverticula  . COLONOSCOPY  11/28/2004   RMR: Diminutive rectal and left colon polyps as described above, cold biopsied/removed/ Left sided diverticula. The remainder of the colonic mucosa appeared normal.  . COLONOSCOPY  09/15/01   RMR: Multiple diminutive polyps destroyed with dermolysis as described above/ Multiple small polyps on stalks in the colon resected with snare cautery/ Scattered pan colonic diverticulum/ The remainder of the colonic mucosa appeared normal  . COLONOSCOPY N/A 05/01/2015   Procedure: COLONOSCOPY; Surgeon: Daneil Dolin, MD; Location[facility]; Service: Endoscopy; Laterality: N/A; 1115  . CYSTECTOMY    . CYSTOSCOPY  04/2014  . CYSTOSTOMY W/ BLADDER BIOPSY    . FLEXIBLE SIGMOIDOSCOPY  2014   Dr. Olevia Perches: tubular adenoma, negative stool studies   . LUNG LOBECTOMY    . TONSILLECTOMY    . TRANSURETHRAL RESECTION OF PROSTATE    . VIDEO BRONCHOSCOPY WITH ENDOBRONCHIAL NAVIGATION N/A 10/10/2013   Procedure: VIDEO BRONCHOSCOPY WITH ENDOBRONCHIAL NAVIGATION; Surgeon: Melrose Nakayama, MD; Location: [place]; Service: Thoracic; Laterality: N/A; NO BLOOD THINNERS BUT PATIENT HAS ITP  . WEDGE RESECTION  04/2011   carcinoma of lung   reports that he quit smoking about 3 years ago. His smoking use included Cigarettes. He started smoking about 60 years ago. He has a 50.00 pack-year smoking history. He has never used smokeless tobacco. He reports that he does not drink alcohol or use drugs. Social History        Social History    . Marital status: Married    Spouse name: [name]   . Number of children: 2  . Years of education: 12+       Occupational History  . Retired  [employer]        Social History Main Topics  . Smoking status: Former Smoker    Packs/day: 1.00    Years: 50.00    Types: Cigarettes    Start date: 07/22/1955    Quit date: 12/19/2012  . Smokeless tobacco: Never Used  . Alcohol use No  . Drug use: No  . Sexual activity: No       Other Topics Concern  . Not on file      Social History Narrative   Patient lives at home with his wife [name].    Patient has 2 children.    Patient is retired.    Patient is right handed.    Patient has 2 years of college.    3-4 cups of caffeine daily.       Functional Status Survey:         Family History  Problem Relation Age of Onset  . Aortic aneurysm Mother   . Emphysema Father     smoker  . Clotting disorder Father   .  Arthritis Father   . Hypertension Father   . Diabetes Father   . Aortic aneurysm Father   . Tremor Father   . Stroke Paternal Grandmother   . Other Paternal Grandfather     brain aneurysm  . Colon cancer Cousin            Allergies  Allergen Reactions  . Codeine Anaphylaxis  . Etodolac     dizziness          Medication List              albuterol108 (90 Base) MCG/ACT inhaler Commonly known as: PROAIR HFA Inhale 2 puffs into the lungs every 6 (six) hours as needed for wheezing.  ALPRAZolam0.5 MG tablet Commonly known as: XANAX TAKE ONE TABLET TWICE DAILY AS NEEDED FOR ANXIETY  calcitRIOL0.25 MCG capsule Commonly known as: ROCALTROL Take 0.25 mcg by mouth daily.  cetirizine10 MG tablet Commonly known as: ZYRTEC Take 1 tablet (10 mg total) by mouth daily.  citalopram20 MG tablet Commonly known as: CELEXA TAKE ONE (1) TABLET BY MOUTH EVERY DAY  cyanocobalamin1000 MCG tablet Take 1 tablet (1,000 mcg  total) by mouth daily.  fluticasone50 MCG/ACT nasal spray Commonly known as: FLONASE Place 1 spray into both nostrils daily.  ipratropium0.06 % nasal spray Commonly known as: ATROVENT Place 2 sprays into both nostrils 4 (four) times daily.  niacin500 MG CR tablet Commonly known as: NIASPAN Take 2 tablets (1,000 mg total) by mouth at bedtime.  nitroGLYCERIN0.4 MG SL tablet Commonly known as: NITROSTAT Place 1 tablet (0.4 mg total) under the tongue every 5 (five) minutes as needed. Call MD if need more than 2  pravastatin80 MG tablet Commonly known as: PRAVACHOL TAKE ONE (1) TABLET EACH DAY  senna-docusate8.6-50 MG tablet Commonly known as: Senokot-S Take 1 tablet by mouth 2 (two) times daily.  SPIRIVA HANDIHALER18 MCG inhalation capsule Generic drug: tiotropium INHALE 1 DOSE BY MOUTH ONCE DAILY  TYLENOL500 MG tablet Generic drug: acetaminophen Take 500 mg by mouth every 6 (six) hours as needed for mild pain.      Review of Systems  Constitutional: Negative for appetite change, diaphoresis, fatigueand fever.  HENT: Had complain of nasal congestion but is not complaining of that today  Eyes: Negative for pain, rednessand itching.  Respiratory: Positive for cough. Negative for apnea, choking, chest tightness, shortness of breathand wheezing.  Cardiovascular: Negative.  for chest pain-appears to have some mild lower extremity edema Gastrointestinal: Po. Negative for abdominal distentionand abdominal pain.  Neurological: Positive for headaches. Negative for dizziness, light-headednessand numbness.    Temperature 97.2 pulse 79 respirations 16 blood pressure 139/80 weight is stable at 260.4  Physical Exam Constitutional: He is oriented to person, place, and time. He appears well-developedand well-nourished. -Lying comfortably in bed HENT:  Head: Normocephalic.  . Eyes: Pupils are equal, round, and reactive to light.  Cardiovascular: Distant  heart sounds Normal rate, regular rhythm.  I would say he has trace lower extremity edema his legs currently are elevated-pedal pulses are intact bilaterally Pulmonary/Chest: Breath sounds normal.   somewhat shallow air entry Abdominal: Soft. Bowel sounds are normal. He exhibits no distension. There is no tenderness.  Has abdominal hernia secondary to AAA repair surgery Musculoskeletal: He exhibits edema.  Neurological: He is alertand oriented to person, place, and time.  Good motor strength In all extremities. Skin:   Has some bruising upper extremities which appears to be fairly chronic   Labs reviewed:  03/28/2016.  WBC 5.5 hemoglobin 13.2 platelets 87,000.  Sodium 140 potassium 4 BUN 30 creatinine 1.87.   Basic Metabolic Panel:  IOXBDZHGDJ([id number])   Recent Labs  03/22/16 0758 03/23/16 0522 03/24/16 0344  NA 140 143 143  K 4.2 4.0 4.0  CL 108 112* 109  CO2 '28 25 29  '$ GLUCOSE 141* 115* 105*  BUN 21* 21* 24*  CREATININE 1.78* 1.77* 1.86*  CALCIUM 9.1 8.8* 8.9     Liver Function Tests:  RecentLabs(withinlast365days)   Recent Labs  08/27/15 0811 02/22/16 0910 03/21/16 1300  AST '12 14 16  '$ ALT 15 12 12*  ALKPHOS 72 60 50  BILITOT 0.9 1.2 1.4*  PROT 6.7 6.4 6.7  ALBUMIN 4.0 4.2 3.8       CBC:  RecentLabs(withinlast365days)   Recent Labs  03/21/16 1300 03/22/16 0758  WBC 6.6 5.9  NEUTROABS 4.8 --   HGB 13.6 12.8*  HCT 41.2 40.3  MCV 99.5 100.8*  PLT 81* 79*     Cardiac Enzymes:  BNP: RecentLabs(withinlast365days)  Invalid input(s): POCBNP   RecentLabs       Lab Results  Component Value Date   HGBA1C 5.7 (H) 03/22/2016     RecentLabs       Lab Results  Component Value Date   TSH 3.506 01/31/2013     RecentLabs       Lab Results  Component Value Date   VITAMINB12 208 03/23/2016     RecentLabs       Lab Results  Component Value Date    FOLATE 7.3 03/23/2016     RecentLabs       Lab Results  Component Value Date   IRON 51 06/03/2013   TIBC 247 05/26/2011   FERRITIN 282 05/26/2011      Imaging and Procedures obtained prior to SNF admission: Ct Head Wo Contrast  Result Date: 03/22/2016 CLINICAL DATA: Follow-up exam for acute subdural hematoma. EXAM: CT HEAD WITHOUT CONTRAST TECHNIQUE: Contiguous axial images were obtained from the base of the skull through the vertex without intravenous contrast. COMPARISON: Prior CT from 03/21/2016. FINDINGS: Brain: Right holo hemispheric subdural hematoma again seen. Overall size of the hematoma is not significantly changed measuring up to 11 mm in maximal thickness at the right parietal convexity. Slight extension along the posterior falx. Extension along the tentorium present as well. Probable trace underlying subarachnoid hemorrhage noted within the right parieto-occipital region. Small left subdural hematoma present as well, most evident over the left frontal convexity were measures up to 4 mm in maximal thickness. This is grossly similar to previous as well. No other acute intracranial hemorrhage. No evidence for acute large vessel territory infarct. No mass effect or midline shift. No hydrocephalus. Stable atrophy with chronic microvascular ischemic disease. Small left periorbital scalp contusion noted. Scalp soft tissues otherwise unremarkable. No acute abnormality about the globes and orbits. Scattered mucosal thickening throughout the paranasal sinuses. Right frontal sinus partially opacified. Possible nasal bone fracture again noted. No mastoid effusion. Calvarium intact. IMPRESSION: 1. No significant interval change in size of right subdural hematoma measuring up to 11 mm in maximal thickness. No significant mass effect or midline shift. 2. Smaller acute left subdural hematoma measuring up to 4 mm in maximal thickness without significant mass effect. This is unchanged  from prior study. 3. No other acute intracranial process identified. Electronically Signed By: Jeannine Boga M.D. On: 03/22/2016 06:57   Ct Head Wo Contrast  Result Date: 03/21/2016 CLINICAL DATA: Fall today. Left facial abrasions. History of colon, long and bladder cancer. EXAM: CT HEAD  WITHOUT CONTRAST CT MAXILLOFACIAL WITHOUT CONTRAST CT CERVICAL SPINE WITHOUT CONTRAST TECHNIQUE: Multidetector CT imaging of the head, cervical spine, and maxillofacial structures were performed using the standard protocol without intravenous contrast. Multiplanar CT image reconstructions of the cervical spine and maxillofacial structures were also generated. COMPARISON: PET-CT 10/03/2013. FINDINGS: CT HEAD FINDINGS Brain: There is a right hemispheric subdural hematoma layering over the frontal, parietal and temporal lobes. This measures up to 11 mm in thickness over the posterior parietal region. There is no resulting midline shift, intraparenchymal hematoma or hydrocephalus. There is no evidence of acute stroke, brain edema or mass lesion. Vascular: Intracranial vascular calcifications are present. Skull: Negative for fracture or focal lesion. Sinuses/Orbits: See below Other: None. CT MAXILLOFACIAL FINDINGS Mild irregularity of the nasal bones, potentially nondisplaced acute fractures. No other evidence of acute maxillofacial fracture. There is some mucosal thickening in the right frontal and left maxillary sinuses. No air-fluid levels are identified. The mastoid air cells and middle ears are clear. TMJ degenerative changes are present bilaterally. There is mild left supraorbital soft tissue swelling. No evidence of orbital hematoma. The globes are intact. The optic nerves and extraocular muscles appear normal. CT CERVICAL SPINE FINDINGS The alignment is normal. There is no evidence of acute fracture or traumatic subluxation. There is mild spondylosis, primarily at C1-2. No acute soft tissue findings or  high-grade spinal stenosis demonstrated. Carotid arterial calcifications are present bilaterally. There is also atherosclerosis of the aortic arch. IMPRESSION: 1. Right hemispheric subdural hematoma as described. No resulting midline shift or hydrocephalus. 2. Possible nondisplaced nasal bone fractures. No other acute maxillofacial findings. 3. No evidence of acute cervical spine fracture, traumatic subluxation or static signs of instability. 4. Critical Value/emergent results were called by telephone at the time of interpretation on 03/21/2016 at 11:20 am to Dr. Fredia Sorrow , who verbally acknowledged these results. Electronically Signed By: Richardean Sale M.D. On: 03/21/2016 11:24   Ct Cervical Spine Wo Contrast  Result Date: 03/21/2016 CLINICAL DATA: Fall today. Left facial abrasions. History of colon, long and bladder cancer. EXAM: CT HEAD WITHOUT CONTRAST CT MAXILLOFACIAL WITHOUT CONTRAST CT CERVICAL SPINE WITHOUT CONTRAST TECHNIQUE: Multidetector CT imaging of the head, cervical spine, and maxillofacial structures were performed using the standard protocol without intravenous contrast. Multiplanar CT image reconstructions of the cervical spine and maxillofacial structures were also generated. COMPARISON: PET-CT 10/03/2013. FINDINGS: CT HEAD FINDINGS Brain: There is a right hemispheric subdural hematoma layering over the frontal, parietal and temporal lobes. This measures up to 11 mm in thickness over the posterior parietal region. There is no resulting midline shift, intraparenchymal hematoma or hydrocephalus. There is no evidence of acute stroke, brain edema or mass lesion. Vascular: Intracranial vascular calcifications are present. Skull: Negative for fracture or focal lesion. Sinuses/Orbits: See below Other: None. CT MAXILLOFACIAL FINDINGS Mild irregularity of the nasal bones, potentially nondisplaced acute fractures. No other evidence of acute maxillofacial fracture. There is some mucosal  thickening in the right frontal and left maxillary sinuses. No air-fluid levels are identified. The mastoid air cells and middle ears are clear. TMJ degenerative changes are present bilaterally. There is mild left supraorbital soft tissue swelling. No evidence of orbital hematoma. The globes are intact. The optic nerves and extraocular muscles appear normal. CT CERVICAL SPINE FINDINGS The alignment is normal. There is no evidence of acute fracture or traumatic subluxation. There is mild spondylosis, primarily at C1-2. No acute soft tissue findings or high-grade spinal stenosis demonstrated. Carotid arterial calcifications are present bilaterally. There is also  atherosclerosis of the aortic arch. IMPRESSION: 1. Right hemispheric subdural hematoma as described. No resulting midline shift or hydrocephalus. 2. Possible nondisplaced nasal bone fractures. No other acute maxillofacial findings. 3. No evidence of acute cervical spine fracture, traumatic subluxation or static signs of instability. 4. Critical Value/emergent results were called by telephone at the time of interpretation on 03/21/2016 at 11:20 am to Dr. Fredia Sorrow , who verbally acknowledged these results. Electronically Signed By: Richardean Sale M.D. On: 03/21/2016 11:24   Dg Hand Complete Left  Result Date: 03/21/2016 CLINICAL DATA: Pain following fall EXAM: LEFT HAND - COMPLETE 3+ VIEW COMPARISON: Left thumb May 28, 2012 FINDINGS: Frontal, oblique, and lateral views were obtained. There is no demonstrable fracture or dislocation. There is mild osteoarthritic change in the first carpal -metacarpal and first IP joints. No erosive changes. No radiopaque foreign body or soft tissue air. IMPRESSION: Areas of mild osteoarthritic change laterally. No fracture or dislocation. No radiopaque foreign body or soft tissue air. Electronically Signed By: Lowella Grip III M.D. On: 03/21/2016 10:20   Ct Maxillofacial Wo Cm  Result Date:  03/21/2016 CLINICAL DATA: Fall today. Left facial abrasions. History of colon, long and bladder cancer. EXAM: CT HEAD WITHOUT CONTRAST CT MAXILLOFACIAL WITHOUT CONTRAST CT CERVICAL SPINE WITHOUT CONTRAST TECHNIQUE: Multidetector CT imaging of the head, cervical spine, and maxillofacial structures were performed using the standard protocol without intravenous contrast. Multiplanar CT image reconstructions of the cervical spine and maxillofacial structures were also generated. COMPARISON: PET-CT 10/03/2013. FINDINGS: CT HEAD FINDINGS Brain: There is a right hemispheric subdural hematoma layering over the frontal, parietal and temporal lobes. This measures up to 11 mm in thickness over the posterior parietal region. There is no resulting midline shift, intraparenchymal hematoma or hydrocephalus. There is no evidence of acute stroke, brain edema or mass lesion. Vascular: Intracranial vascular calcifications are present. Skull: Negative for fracture or focal lesion. Sinuses/Orbits: See below Other: None. CT MAXILLOFACIAL FINDINGS Mild irregularity of the nasal bones, potentially nondisplaced acute fractures. No other evidence of acute maxillofacial fracture. There is some mucosal thickening in the right frontal and left maxillary sinuses. No air-fluid levels are identified. The mastoid air cells and middle ears are clear. TMJ degenerative changes are present bilaterally. There is mild left supraorbital soft tissue swelling. No evidence of orbital hematoma. The globes are intact. The optic nerves and extraocular muscles appear normal. CT CERVICAL SPINE FINDINGS The alignment is normal. There is no evidence of acute fracture or traumatic subluxation. There is mild spondylosis, primarily at C1-2. No acute soft tissue findings or high-grade spinal stenosis demonstrated. Carotid arterial calcifications are present bilaterally. There is also atherosclerosis of the aortic arch. IMPRESSION: 1. Right hemispheric subdural  hematoma as described. No resulting midline shift or hydrocephalus. 2. Possible nondisplaced nasal bone fractures. No other acute maxillofacial findings. 3. No evidence of acute cervical spine fracture, traumatic subluxation or static signs of instability. 4. Critical Value/emergent results were called by telephone at the time of interpretation on 03/21/2016 at 11:20 am to Dr. Fredia Sorrow , who verbally acknowledged these results. Electronically Signed By: Richardean Sale M.D. On: 03/21/2016 11:24    Assessment/Plan  Subdural Hematoma  Patient Doing well neurologically no change.  Repeat CT scan is before his appointment with Dr Vertell Limber.  Fall precautions Physical therapy Tylenol PRN for Pain.  Edema-this appears fairly minimal today at this point will monitor his weight is stable I do note a history of grade 1 diastolic dysfunction-I did discuss this with his wife at  bedside  Essential Hypertension  Blood pressure appears relatively well controlled I do not note any tachycardia today apparently this was a concern earlier in his stay at this point will monitor.       c CKD secondary to Diabetes  Creat Stable at 1.87 .  Diabetes type2 not on any hypoglycemics  BS running in 100's range Last HGA1c was 5.7 Continue accu check BID.  ITP Platelet count is low. With his SDH his aspirin is on hold. Platelets appears stable per lab done by hematology yesterday.   [id number]

## 2016-03-29 NOTE — Progress Notes (Signed)
This is an acute visit.  Level care skilled.  Facility [facility].  Chief complaint acute visit follow-up possible edema-thrombocytopenia-hypertension.  History of present illness.  Patient is a pleasant 82 year old male here for rehabilitation after sustaining a subdural hematoma secondary to a fall.  This is being treated conservatively per neurosurgical evaluation.  Also had a possible nondisplaced fracture of the nasal bone.  Was thought his falls might be related to orthostatic hypertension and his metoprolol lisinopril were stopped.  He appears to be doing well ambulating about facility in his wheelchair.  He does not report any dizziness currently.  Blood pressures appear to be relatively stable 139/80 this morning it was 155/71 later today but according in nursing there are not consistent systolic elevations although this will have to be monitored.  Neurologically appears to be intact he does not complain of any headache dizziness or syncopal-type feelings or increased weakness from baseline.  His wife is concerned today about some possible edema apparently he had been up for some time and had edema in his lower extremities-I did review his chart he did have a cardiac echo done on 03/22/2016 which showed an ejection fraction of 50-55 percent with grade 1 diastolic dysfunction he is not currently on a diuretic.  I have reviewed his weights and this actually stable with weight of 260.4 which is stable with his admission weight.  He also has a history of essential thrombocytopenia this is followed by hematology actually he saw hematology yesterday and thought to be doing well platelets of 87,000 on lab done yesterday appear to be relatively baseline Continues with somewhat chronic bruising especially upper extremities which is not new  He is not complaining of any shortness of breath or chest pain.  Past Medical History:  Diagnosis Date  . AAA (abdominal aortic  aneurysm) ([place]) 2004   s/p repair 2004; 4.3 cm infrarenal in 05/2011  . Abnormality of gait 02/23/2013  . Adenocarcinoma of right lung ([place]) 02/24/2011   Ct A/P 2012:  2cm lung mass RLL PET 9147:  Hypermetabolic RLL mass, no other hypermetabolic areas. TTNA 02/2011:  Adenocarcinoma, markers c/w lung origin Right lower lobe superior segmentectomy. 04/01/2011 Dr. Arlyce Dice   . Adenocarcinoma, lung ([place]) 01/2011   transthoracic FNA; resection of the superior segment of the RLL in 03/2011; negative nodes; no chemotherapy nor radiation planned  . Arm fracture    right arm  . Arteriosclerotic cardiovascular disease (ASCVD) 1973, 12/2010   S/P NSTEMI secondary to distal RCA/PL lesion, tx medically.  EF of  55%-60% per  echo.  . Benign prostatic hypertrophy    s/p transurethral resection of the prostate  . Bilateral renal masses    Cystic, more prominent on CT in 12/2010 than 2007; followed by Dr. Rosana Hoes  . Bladder cancer [facility]) 1996   Transurethral resection of the bladder + chemotherapy/BCG as premed  . Cataract   . Chronic kidney disease    Creatinine 1.4 on discharge 12/20/2100; proteinuria; normal renal ultrasound in 2010; recent creatinines of 1.7-2.; Bilateral cystic renal masses by CT in 2011  . COPD (chronic obstructive pulmonary disease) ([place])   . Coronary artery disease   . Cough    thick phlegm  . Diabetes mellitus    Type II  . Diplopia 02/23/2013  . Diverticulosis   . Essential and other specified forms of tremor 02/23/2013  . Hx of Clostridium difficile infection   . Hyperlipidemia   . Hypertension   . Insomnia   . ITP (  idiopathic thrombocytopenic purpura) 09/07/2012   Chronic ITP of adults versus medication-induced ITP.  Stable  . Myocardial infarction ([place])   . Nephrolithiasis 2012   ARF in 01/2011 due to obstructing nephrolithiasis  . Obesity   . OSA (obstructive sleep apnea)   . Polyneuropathy in diabetes(357.2) 02/23/2013  . Thrombocytopenia ([place])   .  Tobacco abuse    50-pack-year consumption; quit in 12/2010  . Tubular adenoma of colon   . Ventral hernia         Past Surgical History:  Procedure Laterality Date  . ABDOMINAL AORTIC ANEURYSM REPAIR  2004  . CARDIAC CATHETERIZATION    . COLONOSCOPY  03/19/2010   Dr. Gala Romney -(poor prep) Anal papilla, rectal hyperplastic polyp, tubular adenoma removed splenic flexure, left-sided diverticula  . COLONOSCOPY  11/28/2004   RMR:  Diminutive rectal and left colon polyps as described above, cold  biopsied/removed/  Left sided diverticula. The remainder of the colonic mucosa appeared normal.  . COLONOSCOPY   09/15/01   RMR: Multiple diminutive polyps destroyed with dermolysis as described above/ Multiple small polyps on stalks in the colon resected with snare cautery/ Scattered pan colonic diverticulum/ The remainder of the colonic mucosa appeared normal  . COLONOSCOPY N/A 05/01/2015   Procedure: COLONOSCOPY;  Surgeon: Daneil Dolin, MD;  [facility]: [facility];  Service: Endoscopy;  Laterality: N/A;  1115  . CYSTECTOMY    . CYSTOSCOPY  04/2014  . CYSTOSTOMY W/ BLADDER BIOPSY    . FLEXIBLE SIGMOIDOSCOPY  2014   Dr. Olevia Perches: tubular adenoma, negative stool studies   . LUNG LOBECTOMY    . TONSILLECTOMY    . TRANSURETHRAL RESECTION OF PROSTATE    . VIDEO BRONCHOSCOPY WITH ENDOBRONCHIAL NAVIGATION N/A 10/10/2013   Procedure: VIDEO BRONCHOSCOPY WITH ENDOBRONCHIAL NAVIGATION;  Surgeon: Melrose Nakayama, MD;  Location: [facility];  Service: Thoracic;  Laterality: N/A;  NO BLOOD THINNERS BUT PATIENT HAS ITP  . WEDGE RESECTION  04/2011   carcinoma of lung    reports that he quit smoking about 3 years ago. His smoking use included Cigarettes. He started smoking about 60 years ago. He has a 50.00 pack-year smoking history. He has never used smokeless tobacco. He reports that he does not drink alcohol or use drugs. Social History        Social History  . Marital status:  Married    Spouse name: [name]   . Number of children: 2  . Years of education: 12+       Occupational History  . Retired  [employer]        Social History Main Topics  . Smoking status: Former Smoker    Packs/day: 1.00    Years: 50.00    Types: Cigarettes    Start date: 07/22/1955    Quit date: 12/19/2012  . Smokeless tobacco: Never Used  . Alcohol use No  . Drug use: No  . Sexual activity: No       Other Topics Concern  . Not on file      Social History Narrative   Patient lives at home with his wife [name].    Patient has 2 children.    Patient is retired.    Patient is right handed.    Patient has 2 years of college.    3-4 cups of caffeine daily.       Functional Status Survey:          Family History  Problem Relation Age of Onset  .  Aortic aneurysm Mother   . Emphysema Father     smoker  . Clotting disorder Father   . Arthritis Father   . Hypertension Father   . Diabetes Father   . Aortic aneurysm Father   . Tremor Father   . Stroke Paternal Grandmother   . Other Paternal Grandfather     brain aneurysm  . Colon cancer Cousin            Allergies  Allergen Reactions  . Codeine Anaphylaxis  . Etodolac     dizziness          Medication List               albuterol 108 (90 Base) MCG/ACT inhaler Commonly known as:  PROAIR HFA Inhale 2 puffs into the lungs every 6 (six) hours as needed for wheezing.  ALPRAZolam 0.5 MG tablet Commonly known as:  XANAX TAKE ONE TABLET TWICE DAILY AS NEEDED FOR ANXIETY  calcitRIOL 0.25 MCG capsule Commonly known as:  ROCALTROL Take 0.25 mcg by mouth daily.  cetirizine 10 MG tablet Commonly known as:  ZYRTEC Take 1 tablet (10 mg total) by mouth daily.  citalopram 20 MG tablet Commonly known as:  CELEXA TAKE ONE (1) TABLET BY MOUTH EVERY DAY  cyanocobalamin 1000 MCG tablet Take 1 tablet (1,000 mcg total) by mouth daily.  fluticasone 50 MCG/ACT  nasal spray Commonly known as:  FLONASE Place 1 spray into both nostrils daily.  ipratropium 0.06 % nasal spray Commonly known as:  ATROVENT Place 2 sprays into both nostrils 4 (four) times daily.  niacin 500 MG CR tablet Commonly known as:  NIASPAN Take 2 tablets (1,000 mg total) by mouth at bedtime.  nitroGLYCERIN 0.4 MG SL tablet Commonly known as:  NITROSTAT Place 1 tablet (0.4 mg total) under the tongue every 5 (five) minutes as needed. Call MD if need more than 2  pravastatin 80 MG tablet Commonly known as:  PRAVACHOL TAKE ONE (1) TABLET EACH DAY  senna-docusate 8.6-50 MG tablet Commonly known as:  Senokot-S Take 1 tablet by mouth 2 (two) times daily.  SPIRIVA HANDIHALER 18 MCG inhalation capsule Generic drug:  tiotropium INHALE 1 DOSE BY MOUTH ONCE DAILY  TYLENOL 500 MG tablet Generic drug:  acetaminophen Take 500 mg by mouth every 6 (six) hours as needed for mild pain.      Review of Systems  Constitutional: Negative for appetite change, diaphoresis, fatigue and fever.  HENT: Had complain of nasal congestion but is not complaining of that today   Eyes: Negative for pain, redness and itching.  Respiratory: Positive for cough. Negative for apnea, choking, chest tightness, shortness of breath and wheezing.   Cardiovascular: Negative.   for chest pain-appears to have some mild lower extremity edema Gastrointestinal: Po. Negative for abdominal distention and abdominal pain.  Neurological: Positive for headaches. Negative for dizziness, light-headedness and numbness.    Temperature 97.2 pulse 79 respirations 16 blood pressure 139/80 weight is stable at 260.4  Physical Exam  Constitutional: He is oriented to person, place, and time. He appears well-developed and well-nourished. -Lying comfortably in bed HENT:  Head: Normocephalic.  .  Eyes: Pupils are equal, round, and reactive to light.  Cardiovascular: Distant heart sounds Normal rate, regular rhythm .  I would  say he has trace lower extremity edema his legs currently are elevated-pedal pulses are intact bilaterally Pulmonary/Chest: Breath sounds normal.   somewhat shallow air entry Abdominal: Soft. Bowel sounds are normal. He exhibits no distension. There  is no tenderness.  Has abdominal hernia secondary to AAA repair surgery  Musculoskeletal: He exhibits edema.  Neurological: He is alert and oriented to person, place, and time.  Good motor strength In all extremities.  Skin:   Has some bruising upper extremities which appears to be fairly chronic   Labs reviewed:  03/28/2016.  WBC 5.5 hemoglobin 13.2 platelets 87,000.  Sodium 140 potassium 4 BUN 30 creatinine 1.87.   Basic Metabolic Panel:  KGYJEHUDJS([id number])   Recent Labs  03/22/16 0758 03/23/16 0522 03/24/16 0344  NA 140 143 143  K 4.2 4.0 4.0  CL 108 112* 109  CO2 '28 25 29  '$ GLUCOSE 141* 115* 105*  BUN 21* 21* 24*  CREATININE 1.78* 1.77* 1.86*  CALCIUM 9.1 8.8* 8.9     Liver Function Tests:  RecentLabs(withinlast365days)   Recent Labs  08/27/15 0811 02/22/16 0910 03/21/16 1300  AST '12 14 16  '$ ALT 15 12 12*  ALKPHOS 72 60 50  BILITOT 0.9 1.2 1.4*  PROT 6.7 6.4 6.7  ALBUMIN 4.0 4.2 3.8       CBC:  RecentLabs(withinlast365days)   Recent Labs  03/21/16 1300 03/22/16 0758  WBC 6.6 5.9  NEUTROABS 4.8  --   HGB 13.6 12.8*  HCT 41.2 40.3  MCV 99.5 100.8*  PLT 81* 79*     Cardiac Enzymes:  BNP: RecentLabs(withinlast365days)  Invalid input(s): POCBNP   RecentLabs       Lab Results  Component Value Date   HGBA1C 5.7 (H) 03/22/2016     RecentLabs       Lab Results  Component Value Date   TSH 3.506 01/31/2013     RecentLabs       Lab Results  Component Value Date   VITAMINB12 208 03/23/2016     RecentLabs       Lab Results  Component Value Date   FOLATE 7.3 03/23/2016     RecentLabs       Lab Results  Component  Value Date   IRON 51 06/03/2013   TIBC 247 05/26/2011   FERRITIN 282 05/26/2011      Imaging and Procedures obtained prior to SNF admission: Ct Head Wo Contrast  Result Date: 03/22/2016 CLINICAL DATA:  Follow-up exam for acute subdural hematoma. EXAM: CT HEAD WITHOUT CONTRAST TECHNIQUE: Contiguous axial images were obtained from the base of the skull through the vertex without intravenous contrast. COMPARISON:  Prior CT from 03/21/2016. FINDINGS: Brain: Right holo hemispheric subdural hematoma again seen. Overall size of the hematoma is not significantly changed measuring up to 11 mm in maximal thickness at the right parietal convexity. Slight extension along the posterior falx. Extension along the tentorium present as well. Probable trace underlying subarachnoid hemorrhage noted within the right parieto-occipital region. Small left subdural hematoma present as well, most evident over the left frontal convexity were measures up to 4 mm in maximal thickness. This is grossly similar to previous as well. No other acute intracranial hemorrhage. No evidence for acute large vessel territory infarct. No mass effect or midline shift. No hydrocephalus. Stable atrophy with chronic microvascular ischemic disease. Small left periorbital scalp contusion noted. Scalp soft tissues otherwise unremarkable. No acute abnormality about the globes and orbits. Scattered mucosal thickening throughout the paranasal sinuses. Right frontal sinus partially opacified. Possible nasal bone fracture again noted. No mastoid effusion. Calvarium intact. IMPRESSION: 1. No significant interval change in size of right subdural hematoma measuring up to 11 mm in maximal thickness. No significant mass effect or midline shift.  2. Smaller acute left subdural hematoma measuring up to 4 mm in maximal thickness without significant mass effect. This is unchanged from prior study. 3. No other acute intracranial process identified. Electronically  Signed   By: Jeannine Boga M.D.   On: 03/22/2016 06:57   Ct Head Wo Contrast  Result Date: 03/21/2016 CLINICAL DATA:  Fall today. Left facial abrasions. History of colon, long and bladder cancer. EXAM: CT HEAD WITHOUT CONTRAST CT MAXILLOFACIAL WITHOUT CONTRAST CT CERVICAL SPINE WITHOUT CONTRAST TECHNIQUE: Multidetector CT imaging of the head, cervical spine, and maxillofacial structures were performed using the standard protocol without intravenous contrast. Multiplanar CT image reconstructions of the cervical spine and maxillofacial structures were also generated. COMPARISON:  PET-CT 10/03/2013. FINDINGS: CT HEAD FINDINGS Brain: There is a right hemispheric subdural hematoma layering over the frontal, parietal and temporal lobes. This measures up to 11 mm in thickness over the posterior parietal region. There is no resulting midline shift, intraparenchymal hematoma or hydrocephalus. There is no evidence of acute stroke, brain edema or mass lesion. Vascular: Intracranial vascular calcifications are present. Skull: Negative for fracture or focal lesion. Sinuses/Orbits: See below Other: None. CT MAXILLOFACIAL FINDINGS Mild irregularity of the nasal bones, potentially nondisplaced acute fractures. No other evidence of acute maxillofacial fracture. There is some mucosal thickening in the right frontal and left maxillary sinuses. No air-fluid levels are identified. The mastoid air cells and middle ears are clear. TMJ degenerative changes are present bilaterally. There is mild left supraorbital soft tissue swelling. No evidence of orbital hematoma. The globes are intact. The optic nerves and extraocular muscles appear normal. CT CERVICAL SPINE FINDINGS The alignment is normal. There is no evidence of acute fracture or traumatic subluxation. There is mild spondylosis, primarily at C1-2. No acute soft tissue findings or high-grade spinal stenosis demonstrated. Carotid arterial calcifications are present  bilaterally. There is also atherosclerosis of the aortic arch. IMPRESSION: 1. Right hemispheric subdural hematoma as described. No resulting midline shift or hydrocephalus. 2. Possible nondisplaced nasal bone fractures. No other acute maxillofacial findings. 3. No evidence of acute cervical spine fracture, traumatic subluxation or static signs of instability. 4. Critical Value/emergent results were called by telephone at the time of interpretation on 03/21/2016 at 11:20 am to Dr. Fredia Sorrow , who verbally acknowledged these results. Electronically Signed   By: Richardean Sale M.D.   On: 03/21/2016 11:24   Ct Cervical Spine Wo Contrast  Result Date: 03/21/2016 CLINICAL DATA:  Fall today. Left facial abrasions. History of colon, long and bladder cancer. EXAM: CT HEAD WITHOUT CONTRAST CT MAXILLOFACIAL WITHOUT CONTRAST CT CERVICAL SPINE WITHOUT CONTRAST TECHNIQUE: Multidetector CT imaging of the head, cervical spine, and maxillofacial structures were performed using the standard protocol without intravenous contrast. Multiplanar CT image reconstructions of the cervical spine and maxillofacial structures were also generated. COMPARISON:  PET-CT 10/03/2013. FINDINGS: CT HEAD FINDINGS Brain: There is a right hemispheric subdural hematoma layering over the frontal, parietal and temporal lobes. This measures up to 11 mm in thickness over the posterior parietal region. There is no resulting midline shift, intraparenchymal hematoma or hydrocephalus. There is no evidence of acute stroke, brain edema or mass lesion. Vascular: Intracranial vascular calcifications are present. Skull: Negative for fracture or focal lesion. Sinuses/Orbits: See below Other: None. CT MAXILLOFACIAL FINDINGS Mild irregularity of the nasal bones, potentially nondisplaced acute fractures. No other evidence of acute maxillofacial fracture. There is some mucosal thickening in the right frontal and left maxillary sinuses. No air-fluid levels are  identified. The mastoid  air cells and middle ears are clear. TMJ degenerative changes are present bilaterally. There is mild left supraorbital soft tissue swelling. No evidence of orbital hematoma. The globes are intact. The optic nerves and extraocular muscles appear normal. CT CERVICAL SPINE FINDINGS The alignment is normal. There is no evidence of acute fracture or traumatic subluxation. There is mild spondylosis, primarily at C1-2. No acute soft tissue findings or high-grade spinal stenosis demonstrated. Carotid arterial calcifications are present bilaterally. There is also atherosclerosis of the aortic arch. IMPRESSION: 1. Right hemispheric subdural hematoma as described. No resulting midline shift or hydrocephalus. 2. Possible nondisplaced nasal bone fractures. No other acute maxillofacial findings. 3. No evidence of acute cervical spine fracture, traumatic subluxation or static signs of instability. 4. Critical Value/emergent results were called by telephone at the time of interpretation on 03/21/2016 at 11:20 am to Dr. Fredia Sorrow , who verbally acknowledged these results. Electronically Signed   By: Richardean Sale M.D.   On: 03/21/2016 11:24   Dg Hand Complete Left  Result Date: 03/21/2016 CLINICAL DATA:  Pain following fall EXAM: LEFT HAND - COMPLETE 3+ VIEW COMPARISON:  Left thumb May 28, 2012 FINDINGS: Frontal, oblique, and lateral views were obtained. There is no demonstrable fracture or dislocation. There is mild osteoarthritic change in the first carpal -metacarpal and first IP joints. No erosive changes. No radiopaque foreign body or soft tissue air. IMPRESSION: Areas of mild osteoarthritic change laterally. No fracture or dislocation. No radiopaque foreign body or soft tissue air. Electronically Signed   By: Lowella Grip III M.D.   On: 03/21/2016 10:20   Ct Maxillofacial Wo Cm  Result Date: 03/21/2016 CLINICAL DATA:  Fall today. Left facial abrasions. History of colon, long and  bladder cancer. EXAM: CT HEAD WITHOUT CONTRAST CT MAXILLOFACIAL WITHOUT CONTRAST CT CERVICAL SPINE WITHOUT CONTRAST TECHNIQUE: Multidetector CT imaging of the head, cervical spine, and maxillofacial structures were performed using the standard protocol without intravenous contrast. Multiplanar CT image reconstructions of the cervical spine and maxillofacial structures were also generated. COMPARISON:  PET-CT 10/03/2013. FINDINGS: CT HEAD FINDINGS Brain: There is a right hemispheric subdural hematoma layering over the frontal, parietal and temporal lobes. This measures up to 11 mm in thickness over the posterior parietal region. There is no resulting midline shift, intraparenchymal hematoma or hydrocephalus. There is no evidence of acute stroke, brain edema or mass lesion. Vascular: Intracranial vascular calcifications are present. Skull: Negative for fracture or focal lesion. Sinuses/Orbits: See below Other: None. CT MAXILLOFACIAL FINDINGS Mild irregularity of the nasal bones, potentially nondisplaced acute fractures. No other evidence of acute maxillofacial fracture. There is some mucosal thickening in the right frontal and left maxillary sinuses. No air-fluid levels are identified. The mastoid air cells and middle ears are clear. TMJ degenerative changes are present bilaterally. There is mild left supraorbital soft tissue swelling. No evidence of orbital hematoma. The globes are intact. The optic nerves and extraocular muscles appear normal. CT CERVICAL SPINE FINDINGS The alignment is normal. There is no evidence of acute fracture or traumatic subluxation. There is mild spondylosis, primarily at C1-2. No acute soft tissue findings or high-grade spinal stenosis demonstrated. Carotid arterial calcifications are present bilaterally. There is also atherosclerosis of the aortic arch. IMPRESSION: 1. Right hemispheric subdural hematoma as described. No resulting midline shift or hydrocephalus. 2. Possible nondisplaced  nasal bone fractures. No other acute maxillofacial findings. 3. No evidence of acute cervical spine fracture, traumatic subluxation or static signs of instability. 4. Critical Value/emergent results were called by  telephone at the time of interpretation on 03/21/2016 at 11:20 am to Dr. Fredia Sorrow , who verbally acknowledged these results. Electronically Signed   By: Richardean Sale M.D.   On: 03/21/2016 11:24    Assessment/Plan  Subdural Hematoma  Patient Doing well neurologically no change.  Repeat CT scan is before his appointment with Dr Vertell Limber.  Fall precautions Physical therapy Tylenol PRN for Pain.  Essential Hypertension  Blood pressure appears relatively well controlled I do not note any tachycardia today apparently this was a concern earlier in his stay at this point will monitor.       c CKD secondary to Diabetes  Creat Stable at 1.87 .  Diabetes type2 not on any hypoglycemics  BS running in 100's range Last HGA1c was 5.7 Continue accu check BID.  ITP Platelet count is low. With his SDH his aspirin is on hold. Platelets appears stable per lab done by hematology yesterday.   C

## 2016-04-01 ENCOUNTER — Encounter (HOSPITAL_COMMUNITY)
Admission: RE | Admit: 2016-04-01 | Discharge: 2016-04-01 | Disposition: A | Discharge status: Home / Self Care | Payer: Medicare Other | Source: Skilled Nursing Facility | Attending: *Deleted | Admitting: *Deleted

## 2016-04-01 LAB — CBC
HEMATOCRIT: 38.7 % — AB (ref 39.0–52.0)
HEMOGLOBIN: 12.7 g/dL — AB (ref 13.0–17.0)
MCH: 32.2 pg (ref 26.0–34.0)
MCHC: 32.8 g/dL (ref 30.0–36.0)
MCV: 98.2 fL (ref 78.0–100.0)
Platelets: 109 10*3/uL — ABNORMAL LOW (ref 150–400)
RBC: 3.94 MIL/uL — AB (ref 4.22–5.81)
RDW: 15.1 % (ref 11.5–15.5)
WBC: 5 10*3/uL (ref 4.0–10.5)

## 2016-04-02 ENCOUNTER — Encounter: Payer: Self-pay | Admitting: Licensed Clinical Social Worker

## 2016-04-02 ENCOUNTER — Other Ambulatory Visit: Payer: Self-pay | Admitting: Licensed Clinical Social Worker

## 2016-04-02 NOTE — Patient Outreach (Signed)
[facility]) Care Management  [facility] Social Work  04/02/2016  JEARL SOTO March 31, 1934 [id number]  Subjective:    Objective:   Encounter Medications:  Outpatient Encounter Prescriptions as of 04/02/2016  Medication Sig  . acetaminophen (TYLENOL) 500 MG tablet Take 500 mg by mouth every 6 (six) hours as needed for mild pain.   Marland Kitchen albuterol (PROAIR HFA) 108 (90 BASE) MCG/ACT inhaler Inhale 2 puffs into the lungs every 6 (six) hours as needed for wheezing.  Marland Kitchen ALPRAZolam (XANAX) 0.5 MG tablet TAKE ONE TABLET TWICE DAILY AS NEEDED FOR ANXIETY  . calcitRIOL (ROCALTROL) 0.25 MCG capsule Take 0.25 mcg by mouth daily.   . cetirizine (ZYRTEC) 10 MG tablet Take 1 tablet (10 mg total) by mouth daily.  . citalopram (CELEXA) 20 MG tablet TAKE ONE (1) TABLET BY MOUTH EVERY DAY  . fluticasone (FLONASE) 50 MCG/ACT nasal spray Place 1 spray into both nostrils daily.  Marland Kitchen ipratropium (ATROVENT) 0.06 % nasal spray Place 2 sprays into both nostrils 4 (four) times daily.  . niacin (NIASPAN) 500 MG CR tablet Take 2 tablets (1,000 mg total) by mouth at bedtime.  . nitroGLYCERIN (NITROSTAT) 0.4 MG SL tablet Place 1 tablet (0.4 mg total) under the tongue every 5 (five) minutes as needed. Call MD if need more than 2 (Patient not taking: Reported on 03/28/2016)  . pravastatin (PRAVACHOL) 80 MG tablet TAKE ONE (1) TABLET EACH DAY  . senna-docusate (SENOKOT-S) 8.6-50 MG tablet Take 1 tablet by mouth 2 (two) times daily.  Marland Kitchen SPIRIVA HANDIHALER 18 MCG inhalation capsule INHALE 1 DOSE BY MOUTH ONCE DAILY  . vitamin B-12 1000 MCG tablet Take 1 tablet (1,000 mcg total) by mouth daily.   No facility-administered encounter medications on file as of 04/02/2016.     Functional Status:  In your present state of health, do you have any difficulty performing the following activities: 04/02/2016 03/22/2016  Hearing? Y N  Vision? Y N  Difficulty concentrating or making decisions? Y N  Walking or climbing stairs? Y Y   Dressing or bathing? N N  Doing errands, shopping? N N  Preparing Food and eating ? N -  Using the Toilet? N -  In the past six months, have you accidently leaked urine? N -  Do you have problems with loss of bowel control? N -  Managing your Medications? N -  Managing your Finances? N -  Housekeeping or managing your Housekeeping? N -  Some recent data might be hidden    Fall/Depression Screening:  PHQ 2/9 Scores 04/02/2016 03/13/2016 02/15/2016 12/07/2015 10/26/2015 08/30/2015 04/30/2015  PHQ - 2 Score 0 0 0 0 0 0 0    Assessment:   CSW received referral on [name]. CSW completed chart review on client on 04/02/16.  CSW traveled to [facility] in [place], [place] on 04/02/16 to visit client. CSW met with client on 04/02/16 at patient's room at [facility].  Patient assessed in  [place] for continued care needs. CSW will continue to collaborate with the skilled nursing facility social worker to facilitate discharge planning needs and communicate with the patient and family.  Client is receiving nursing care and physical therapy support while at [facility].  Maurie said he is also receiving speech therapy and occupatoinal therapy at facilty.  Client hopes to remain at facilty for short term care. CSW encouraged client to talk with Bard Herbert, facility social worker to finalize discharge plan for client.  Client has some support from his spouse, [name].  Client has son who lives nearby.  [name] said that client may be able to hire some in home care as needed for temporary care needs of client when client returns home.  Client said he can communicate as normal. Client spoke of recent fall he had experienced at his home.  Client has [employer].  Client did not mention any pain issues. Client said he is eating well and sleeping well. CSW spoke with client about client  care plan. CSW encouraged client to participate in all scheduled physical  therapy sessions for client in next 30 days at facility. CSW gave client [facility] CSW card and encouraged client or spouse to call CSW at [phone] to discuss social work needs of client.  Client and spouse said that they hoped client could stay for 20 days and then be strong enough to discharge home with supports in place.  Client and spouse said that they think an RN from [facility] would be helpful to client once client discharges home from facility. [name] said that client has had 2 falls in the past year. Wilmas said that client had been outdoors bending over when he had fallen the past two times.CSW thanked client and [name] for allowing CSW to visit with client at facility on 04/02/16.    Plan:  Client to participate in all scheduled physical therapy sessons for client at facilty in next 30 days  CSW to call client in two weeks to assess client  needs.  [name].Neeti Knudtson MSW, LCSW Licensed Clinical Social Worker [facility] Care Management [phone].

## 2016-04-03 ENCOUNTER — Ambulatory Visit (INDEPENDENT_AMBULATORY_CARE_PROVIDER_SITE_OTHER): Payer: Medicare Other | Admitting: Otolaryngology

## 2016-04-03 DIAGNOSIS — S022XXA Fracture of nasal bones, initial encounter for closed fracture: Secondary | ICD-10-CM | POA: Diagnosis not present

## 2016-04-07 ENCOUNTER — Ambulatory Visit: Payer: Self-pay

## 2016-04-09 ENCOUNTER — Ambulatory Visit (HOSPITAL_COMMUNITY)
Admit: 2016-04-09 | Discharge: 2016-04-09 | Disposition: A | Discharge status: Home / Self Care | Payer: Medicare Other | Attending: Neurosurgery | Admitting: Neurosurgery

## 2016-04-09 DIAGNOSIS — I6200 Nontraumatic subdural hemorrhage, unspecified: Secondary | ICD-10-CM | POA: Insufficient documentation

## 2016-04-09 DIAGNOSIS — W19XXXD Unspecified fall, subsequent encounter: Secondary | ICD-10-CM | POA: Diagnosis not present

## 2016-04-09 DIAGNOSIS — S065X0A Traumatic subdural hemorrhage without loss of consciousness, initial encounter: Secondary | ICD-10-CM | POA: Diagnosis not present

## 2016-04-11 ENCOUNTER — Non-Acute Institutional Stay (SKILLED_NURSING_FACILITY): Payer: Medicare Other | Admitting: Internal Medicine

## 2016-04-11 ENCOUNTER — Encounter: Payer: Self-pay | Admitting: Internal Medicine

## 2016-04-11 DIAGNOSIS — D693 Immune thrombocytopenic purpura: Secondary | ICD-10-CM | POA: Diagnosis not present

## 2016-04-11 DIAGNOSIS — J309 Allergic rhinitis, unspecified: Secondary | ICD-10-CM

## 2016-04-11 DIAGNOSIS — J441 Chronic obstructive pulmonary disease with (acute) exacerbation: Secondary | ICD-10-CM | POA: Diagnosis not present

## 2016-04-11 DIAGNOSIS — S065X0D Traumatic subdural hemorrhage without loss of consciousness, subsequent encounter: Secondary | ICD-10-CM | POA: Diagnosis not present

## 2016-04-11 NOTE — Progress Notes (Signed)
Location:   [facility] Room Number: 123/P Place of Service:  SNF (31)  Provider: Granville Lewis  PCP: Mickie Hillier, MD Patient Care Team: Mikey Kirschner, MD as PCP - General (Family Medicine) Myrlene Broker, MD (Urology) Williams Che, MD (Ophthalmology) Caprice Beaver, DPM (Podiatry) Lavonna Monarch, MD (Dermatology) Provider Not In System Daneil Dolin, MD (Gastroenterology) Melrose Nakayama, MD as Consulting Physician (Cardiothoracic Surgery) Kathee Delton, MD as Consulting Physician (Pulmonary Disease) Herminio Commons, MD as Attending Physician (Cardiology) Katha Cabal, LCSW as [facility] (Licensed Clinical Social Worker)  Extended Emergency Contact Information Primary Emergency Contact: Mastrianni,[name] D Address: [address]          [place], [place] [address] [place] Phone: [phone] Mobile Phone: [phone] Relation: Spouse Secondary Emergency Contact: Sollenberger,[name] Address: [address]           [place], [place] [address] [place] Phone: [phone] Mobile Phone: [phone] Relation: Son  Code Status: Full Code Goals of care:  Advanced Directive information Advanced Directives 04/11/2016  Does patient have an advance directive? Yes  Type of Advance Directive [place]  Does patient want to make changes to advanced directive? No - Patient declined  Copy of advanced directive(s) in chart? Yes  Pre-existing out of facility DNR order (yellow form or pink MOST form) -     Allergies  Allergen Reactions  . Codeine Anaphylaxis  . Etodolac     dizziness    Chief Complaint  Patient presents with  . Discharge Note    HPI:  82 y.o. male  seen today for discharge. He was admitted here for rehabilitation after sustaining a fall with subsequent subdural hematoma.  Neurology recommended conservative treatment-she also had a  possible nondisplaced fracture of the nasal bone.  Was sought his falls may be related to orthostatic hypotension and his metoprolol lisinopril were discontinued .  He has had a subsequent CT scan which shows improvement with a left subdural hematoma essentially resolved mild residual on the right  His other issues including hypertension COPD chronic kidney disease and thrombocytopenia appear to have improved will need follow-up by primary care provider.  He also has been seen by oncology and thought to be doing well-he does have a history of adenocarcinoma of the lung.  He is looking forward to going home he does have a very supportive family who is in the room with him today      Past Medical History:  Diagnosis Date  . AAA (abdominal aortic aneurysm) ([facility]) 2004   s/p repair 2004; 4.3 cm infrarenal in 05/2011  . Abnormality of gait 02/23/2013  . Adenocarcinoma of right lung ([place]) 02/24/2011   Ct A/P 2012:  2cm lung mass RLL PET 0347:  Hypermetabolic RLL mass, no other hypermetabolic areas. TTNA 02/2011:  Adenocarcinoma, markers c/w lung origin Right lower lobe superior segmentectomy. 04/01/2011 Dr. Arlyce Dice   . Adenocarcinoma, lung ([place]) 01/2011   transthoracic FNA; resection of the superior segment of the RLL in 03/2011; negative nodes; no chemotherapy nor radiation planned  . Arm fracture    right arm  . Arteriosclerotic cardiovascular disease (ASCVD) 1973, 12/2010   S/P NSTEMI secondary to distal RCA/PL lesion, tx medically.  EF of  55%-60% per  echo.  . Benign prostatic hypertrophy    s/p transurethral resection of the prostate  . Bilateral renal masses    Cystic, more  prominent on CT in 12/2010 than 2007; followed by Dr. Rosana Hoes  . Bladder cancer [facility]) 1996   Transurethral resection of the bladder + chemotherapy/BCG as premed  . Cataract   . Chronic kidney disease    Creatinine 1.4 on discharge 12/20/2100; proteinuria; normal renal ultrasound in 2010; recent creatinines of 1.7-2.;  Bilateral cystic renal masses by CT in 2011  . COPD (chronic obstructive pulmonary disease) ([facility])   . Coronary artery disease   . Cough    thick phlegm  . Diabetes mellitus    Type II  . Diplopia 02/23/2013  . Diverticulosis   . Essential and other specified forms of tremor 02/23/2013  . Hx of Clostridium difficile infection   . Hyperlipidemia   . Hypertension   . Insomnia   . ITP (idiopathic thrombocytopenic purpura) 09/07/2012   Chronic ITP of adults versus medication-induced ITP.  Stable  . Myocardial infarction ([facility])   . Nephrolithiasis 2012   ARF in 01/2011 due to obstructing nephrolithiasis  . Obesity   . OSA (obstructive sleep apnea)   . Polyneuropathy in diabetes(357.2) 02/23/2013  . Thrombocytopenia ([place] [facility])   . Tobacco abuse    50-pack-year consumption; quit in 12/2010  . Tubular adenoma of colon   . Ventral hernia     Past Surgical History:  Procedure Laterality Date  . ABDOMINAL AORTIC ANEURYSM REPAIR  2004  . CARDIAC CATHETERIZATION    . COLONOSCOPY  03/19/2010   Dr. Gala Romney -(poor prep) Anal papilla, rectal hyperplastic polyp, tubular adenoma removed splenic flexure, left-sided diverticula  . COLONOSCOPY  11/28/2004   RMR:  Diminutive rectal and left colon polyps as described above, cold  biopsied/removed/  Left sided diverticula. The remainder of the colonic mucosa appeared normal.  . COLONOSCOPY   09/15/01   RMR: Multiple diminutive polyps destroyed with dermolysis as described above/ Multiple small polyps on stalks in the colon resected with snare cautery/ Scattered pan colonic diverticulum/ The remainder of the colonic mucosa appeared normal  . COLONOSCOPY N/A 05/01/2015   Procedure: COLONOSCOPY;  Surgeon: Daneil Dolin, MD;  [facility];  Service: Endoscopy;  Laterality: N/A;  1115  . CYSTECTOMY    . CYSTOSCOPY  04/2014  . CYSTOSTOMY W/ BLADDER BIOPSY    . FLEXIBLE SIGMOIDOSCOPY  2014   Dr. Olevia Perches: tubular adenoma, negative stool studies   . LUNG  LOBECTOMY    . TONSILLECTOMY    . TRANSURETHRAL RESECTION OF PROSTATE    . VIDEO BRONCHOSCOPY WITH ENDOBRONCHIAL NAVIGATION N/A 10/10/2013   Procedure: VIDEO BRONCHOSCOPY WITH ENDOBRONCHIAL NAVIGATION;  Surgeon: Melrose Nakayama, MD;  Location: [facility];  Service: Thoracic;  Laterality: N/A;  NO BLOOD THINNERS BUT PATIENT HAS ITP  . WEDGE RESECTION  04/2011   carcinoma of lung      reports that he quit smoking about 3 years ago. His smoking use included Cigarettes. He started smoking about 60 years ago. He has a 50.00 pack-year smoking history. He has never used smokeless tobacco. He reports that he does not drink alcohol or use drugs. Social History   Social History  . Marital status: Married    Spouse name: [name]   . Number of children: 2  . Years of education: 12+   Occupational History  . Retired  [employer]   Social History Main Topics  . Smoking status: Former Smoker    Packs/day: 1.00    Years: 50.00    Types: Cigarettes    Start date: 07/22/1955    Quit  date: 12/19/2012  . Smokeless tobacco: Never Used  . Alcohol use No  . Drug use: No  . Sexual activity: No   Other Topics Concern  . Not on file   Social History Narrative   Patient lives at home with his wife [name].    Patient has 2 children.    Patient is retired.    Patient is right handed.    Patient has 2 years of college.    3-4 cups of caffeine daily.      Functional Status Survey:    Allergies  Allergen Reactions  . Codeine Anaphylaxis  . Etodolac     dizziness    Pertinent  Health Maintenance Due  Topic Date Due  . FOOT EXAM  02/14/2016  . INFLUENZA VACCINE  06/20/2016 (Originally 02/19/2016)  . OPHTHALMOLOGY EXAM  08/22/2016  . HEMOGLOBIN A1C  09/19/2016  . URINE MICROALBUMIN  02/21/2017  . COLONOSCOPY  04/30/2020  . PNA vac Low Risk Adult  Completed    Medications: Current Outpatient Prescriptions on File Prior to Visit  Medication Sig Dispense Refill  . acetaminophen (TYLENOL) 500 MG  tablet Take 500 mg by mouth every 6 (six) hours as needed for mild pain.     Marland Kitchen albuterol (PROAIR HFA) 108 (90 BASE) MCG/ACT inhaler Inhale 2 puffs into the lungs every 6 (six) hours as needed for wheezing. 1 Inhaler 5  . ALPRAZolam (XANAX) 0.5 MG tablet TAKE ONE TABLET TWICE DAILY AS NEEDED FOR ANXIETY 60 tablet 5  . calcitRIOL (ROCALTROL) 0.25 MCG capsule Take 0.25 mcg by mouth daily.     . cetirizine (ZYRTEC) 10 MG tablet Take 1 tablet (10 mg total) by mouth daily. 30 tablet 5  . fluticasone (FLONASE) 50 MCG/ACT nasal spray Place 1 spray into both nostrils daily. 16 g 2  . ipratropium (ATROVENT) 0.06 % nasal spray Place 2 sprays into both nostrils 4 (four) times daily. 15 mL 5  . niacin (NIASPAN) 500 MG CR tablet Take 2 tablets (1,000 mg total) by mouth at bedtime. 60 tablet 5  . nitroGLYCERIN (NITROSTAT) 0.4 MG SL tablet Place 1 tablet (0.4 mg total) under the tongue every 5 (five) minutes as needed. Call MD if need more than 2 3 tablet 0  . SPIRIVA HANDIHALER 18 MCG inhalation capsule INHALE 1 DOSE BY MOUTH ONCE DAILY 90 capsule 1  . vitamin B-12 1000 MCG tablet Take 1 tablet (1,000 mcg total) by mouth daily.    . [DISCONTINUED] budesonide-formoterol (SYMBICORT) 160-4.5 MCG/ACT inhaler Inhale 2 puffs into the lungs 2 (two) times daily.       No current facility-administered medications on file prior to visit.      Review of Systems  In general no complaints of fever chills says he's feeling stronger.  Skin does not complain of rashes or itching.  Head ears eyes nose mouth and throat initially had complain of some nasal congestion and drainage but this apparently has gotten better.  Eyes does not complain of visual changes.  Respiratory is not complaining of shortness of breath or cough at this time.  Cardiac does not complain of chest pain has minimal lower extremity edema.  GI is not complaining of nausea vomiting diarrhea constipation apparently has a good appetite does not  complaining of abdominal pain.  GU is not complaining of dysuria.  Muscle skeletal still has weakness but has gotten stronger does not complain currently of joint pain.  Neurologic at this point is not complaining of headaches initially did  complain of some head discomfort does not complain of numbness or syncopal-type feelings.  Psych is not complaining of overt anxiety or depression.     Vitals:   04/11/16 0907  BP: (!) 143/62  Pulse: 73  Resp: 20  Temp: 97.2 F (36.2 C)  TempSrc: Oral  SpO2: 96%    Physical Exam  Constitutional: He is oriented to person, place, and time. He appears well-developed and well-nourished--although does have some frailty.  HENT:  Head: Normocephalic.   Eyes: Pupils are equal, round, and reactive to light.  Cardiovascular:-She has trace lower extremity edema Normal rate, regular rhythm and normal heart sounds.   Pulmonary/Chest: Breath sounds normal.   is auscultation s  Abdominal: Soft. Bowel sounds are normal. He exhibits no distension. There is no tenderness.  Has abdominal hernia secondary to AAA repair surgery  Musculoskeletal: He exhibits edema. I would classify this as trace-he is able to stand without assistance and ambulate with a walker although still has some frailty here  Neurological: He is alert and oriented to person, place, and time.  Good motor strength In all extremities.  Skin: . Lots of bruises in both hands.     Labs reviewed: Basic Metabolic Panel:  Recent Labs  03/23/16 0522 03/24/16 0344 03/28/16 1237  NA 143 143 140  K 4.0 4.0 4.0  CL 112* 109 105  CO2 '25 29 26  '$ GLUCOSE 115* 105* 203*  BUN 21* 24* 30*  CREATININE 1.77* 1.86* 1.87*  CALCIUM 8.8* 8.9 9.2   Liver Function Tests:  Recent Labs  02/22/16 0910 03/21/16 1300 03/28/16 1237  AST 14 16 14*  ALT 12 12* 14*  ALKPHOS 60 50 57  BILITOT 1.2 1.4* 1.1  PROT 6.4 6.7 6.6  ALBUMIN 4.2 3.8 3.8   No results for input(s): LIPASE, AMYLASE in the  last 8760 hours. No results for input(s): AMMONIA in the last 8760 hours. CBC:  Recent Labs  03/21/16 1300  03/27/16 0750 03/28/16 1237 04/01/16 0500  WBC 6.6  < > 4.5 5.5 5.0  NEUTROABS 4.8  --  2.8 4.0  --   HGB 13.6  < > 12.3* 13.2 12.7*  HCT 41.2  < > 38.0* 40.2 38.7*  MCV 99.5  < > 98.2 98.5 98.2  PLT 81*  < > 75* 87* 109*  < > = values in this interval not displayed. Cardiac Enzymes: No results for input(s): CKTOTAL, CKMB, CKMBINDEX, TROPONINI in the last 8760 hours. BNP: Invalid input(s): POCBNP CBG:  Recent Labs  03/24/16 0106 03/24/16 0520 03/24/16 0842  GLUCAP 94 120* 110*    Procedures and Imaging Studies During Stay: Ct Head Wo Contrast  Result Date: 04/09/2016 CLINICAL DATA:  Patient fell on 03/21/2016. Follow-up posttraumatic subdural. EXAM: CT HEAD WITHOUT CONTRAST TECHNIQUE: Contiguous axial images were obtained from the base of the skull through the vertex without intravenous contrast. COMPARISON:  03/22/2016. FINDINGS: Brain: There is improvement in the previously identified RIGHT subdural hematoma. There is decrease in thickness from 12 mm to 9 mm, and the hyperdense collection now is hypoattenuating. Mild mass effect on the RIGHT posterior frontal and parietal cortex. No midline shift. No parenchymal hemorrhage. The previously identified acute subdural collection on the LEFT, over the anterior frontal region, is completely resolved. Prominence of extra-axial spaces in this region represents cortical atrophy. The interhemispheric component is also improved/resolved. Vascular: No hyperdense vessel. Moderate vascular calcification in the carotid siphons. Skull: Normal. Negative for fracture or focal lesion. Sinuses/Orbits: No acute finding. Other:  None. IMPRESSION: Improved, but not yet resolved, RIGHT subdural hematoma, now hypodense, 9 mm thickness. Continued surveillance is warranted. Resolve LEFT subdural. No parenchymal hemorrhage or midline shift.  Electronically Signed   By: Staci Righter M.D.   On: 04/09/2016 10:05   Ct Head Wo Contrast  Result Date: 03/22/2016 CLINICAL DATA:  Follow-up exam for acute subdural hematoma. EXAM: CT HEAD WITHOUT CONTRAST TECHNIQUE: Contiguous axial images were obtained from the base of the skull through the vertex without intravenous contrast. COMPARISON:  Prior CT from 03/21/2016. FINDINGS: Brain: Right holo hemispheric subdural hematoma again seen. Overall size of the hematoma is not significantly changed measuring up to 11 mm in maximal thickness at the right parietal convexity. Slight extension along the posterior falx. Extension along the tentorium present as well. Probable trace underlying subarachnoid hemorrhage noted within the right parieto-occipital region. Small left subdural hematoma present as well, most evident over the left frontal convexity were measures up to 4 mm in maximal thickness. This is grossly similar to previous as well. No other acute intracranial hemorrhage. No evidence for acute large vessel territory infarct. No mass effect or midline shift. No hydrocephalus. Stable atrophy with chronic microvascular ischemic disease. Small left periorbital scalp contusion noted. Scalp soft tissues otherwise unremarkable. No acute abnormality about the globes and orbits. Scattered mucosal thickening throughout the paranasal sinuses. Right frontal sinus partially opacified. Possible nasal bone fracture again noted. No mastoid effusion. Calvarium intact. IMPRESSION: 1. No significant interval change in size of right subdural hematoma measuring up to 11 mm in maximal thickness. No significant mass effect or midline shift. 2. Smaller acute left subdural hematoma measuring up to 4 mm in maximal thickness without significant mass effect. This is unchanged from prior study. 3. No other acute intracranial process identified. Electronically Signed   By: Jeannine Boga M.D.   On: 03/22/2016 06:57   Ct Head Wo  Contrast  Result Date: 03/21/2016 CLINICAL DATA:  Fall today. Left facial abrasions. History of colon, long and bladder cancer. EXAM: CT HEAD WITHOUT CONTRAST CT MAXILLOFACIAL WITHOUT CONTRAST CT CERVICAL SPINE WITHOUT CONTRAST TECHNIQUE: Multidetector CT imaging of the head, cervical spine, and maxillofacial structures were performed using the standard protocol without intravenous contrast. Multiplanar CT image reconstructions of the cervical spine and maxillofacial structures were also generated. COMPARISON:  PET-CT 10/03/2013. FINDINGS: CT HEAD FINDINGS Brain: There is a right hemispheric subdural hematoma layering over the frontal, parietal and temporal lobes. This measures up to 11 mm in thickness over the posterior parietal region. There is no resulting midline shift, intraparenchymal hematoma or hydrocephalus. There is no evidence of acute stroke, brain edema or mass lesion. Vascular: Intracranial vascular calcifications are present. Skull: Negative for fracture or focal lesion. Sinuses/Orbits: See below Other: None. CT MAXILLOFACIAL FINDINGS Mild irregularity of the nasal bones, potentially nondisplaced acute fractures. No other evidence of acute maxillofacial fracture. There is some mucosal thickening in the right frontal and left maxillary sinuses. No air-fluid levels are identified. The mastoid air cells and middle ears are clear. TMJ degenerative changes are present bilaterally. There is mild left supraorbital soft tissue swelling. No evidence of orbital hematoma. The globes are intact. The optic nerves and extraocular muscles appear normal. CT CERVICAL SPINE FINDINGS The alignment is normal. There is no evidence of acute fracture or traumatic subluxation. There is mild spondylosis, primarily at C1-2. No acute soft tissue findings or high-grade spinal stenosis demonstrated. Carotid arterial calcifications are present bilaterally. There is also atherosclerosis of the aortic arch. IMPRESSION: 1.  Right  hemispheric subdural hematoma as described. No resulting midline shift or hydrocephalus. 2. Possible nondisplaced nasal bone fractures. No other acute maxillofacial findings. 3. No evidence of acute cervical spine fracture, traumatic subluxation or static signs of instability. 4. Critical Value/emergent results were called by telephone at the time of interpretation on 03/21/2016 at 11:20 am to Dr. Fredia Sorrow , who verbally acknowledged these results. Electronically Signed   By: Richardean Sale M.D.   On: 03/21/2016 11:24   Ct Cervical Spine Wo Contrast  Result Date: 03/21/2016 CLINICAL DATA:  Fall today. Left facial abrasions. History of colon, long and bladder cancer. EXAM: CT HEAD WITHOUT CONTRAST CT MAXILLOFACIAL WITHOUT CONTRAST CT CERVICAL SPINE WITHOUT CONTRAST TECHNIQUE: Multidetector CT imaging of the head, cervical spine, and maxillofacial structures were performed using the standard protocol without intravenous contrast. Multiplanar CT image reconstructions of the cervical spine and maxillofacial structures were also generated. COMPARISON:  PET-CT 10/03/2013. FINDINGS: CT HEAD FINDINGS Brain: There is a right hemispheric subdural hematoma layering over the frontal, parietal and temporal lobes. This measures up to 11 mm in thickness over the posterior parietal region. There is no resulting midline shift, intraparenchymal hematoma or hydrocephalus. There is no evidence of acute stroke, brain edema or mass lesion. Vascular: Intracranial vascular calcifications are present. Skull: Negative for fracture or focal lesion. Sinuses/Orbits: See below Other: None. CT MAXILLOFACIAL FINDINGS Mild irregularity of the nasal bones, potentially nondisplaced acute fractures. No other evidence of acute maxillofacial fracture. There is some mucosal thickening in the right frontal and left maxillary sinuses. No air-fluid levels are identified. The mastoid air cells and middle ears are clear. TMJ degenerative changes are  present bilaterally. There is mild left supraorbital soft tissue swelling. No evidence of orbital hematoma. The globes are intact. The optic nerves and extraocular muscles appear normal. CT CERVICAL SPINE FINDINGS The alignment is normal. There is no evidence of acute fracture or traumatic subluxation. There is mild spondylosis, primarily at C1-2. No acute soft tissue findings or high-grade spinal stenosis demonstrated. Carotid arterial calcifications are present bilaterally. There is also atherosclerosis of the aortic arch. IMPRESSION: 1. Right hemispheric subdural hematoma as described. No resulting midline shift or hydrocephalus. 2. Possible nondisplaced nasal bone fractures. No other acute maxillofacial findings. 3. No evidence of acute cervical spine fracture, traumatic subluxation or static signs of instability. 4. Critical Value/emergent results were called by telephone at the time of interpretation on 03/21/2016 at 11:20 am to Dr. Fredia Sorrow , who verbally acknowledged these results. Electronically Signed   By: Richardean Sale M.D.   On: 03/21/2016 11:24   Dg Hand Complete Left  Result Date: 03/21/2016 CLINICAL DATA:  Pain following fall EXAM: LEFT HAND - COMPLETE 3+ VIEW COMPARISON:  Left thumb May 28, 2012 FINDINGS: Frontal, oblique, and lateral views were obtained. There is no demonstrable fracture or dislocation. There is mild osteoarthritic change in the first carpal -metacarpal and first IP joints. No erosive changes. No radiopaque foreign body or soft tissue air. IMPRESSION: Areas of mild osteoarthritic change laterally. No fracture or dislocation. No radiopaque foreign body or soft tissue air. Electronically Signed   By: Lowella Grip III M.D.   On: 03/21/2016 10:20   Ct Maxillofacial Wo Cm  Result Date: 03/21/2016 CLINICAL DATA:  Fall today. Left facial abrasions. History of colon, long and bladder cancer. EXAM: CT HEAD WITHOUT CONTRAST CT MAXILLOFACIAL WITHOUT CONTRAST CT CERVICAL  SPINE WITHOUT CONTRAST TECHNIQUE: Multidetector CT imaging of the head, cervical spine, and maxillofacial structures were  performed using the standard protocol without intravenous contrast. Multiplanar CT image reconstructions of the cervical spine and maxillofacial structures were also generated. COMPARISON:  PET-CT 10/03/2013. FINDINGS: CT HEAD FINDINGS Brain: There is a right hemispheric subdural hematoma layering over the frontal, parietal and temporal lobes. This measures up to 11 mm in thickness over the posterior parietal region. There is no resulting midline shift, intraparenchymal hematoma or hydrocephalus. There is no evidence of acute stroke, brain edema or mass lesion. Vascular: Intracranial vascular calcifications are present. Skull: Negative for fracture or focal lesion. Sinuses/Orbits: See below Other: None. CT MAXILLOFACIAL FINDINGS Mild irregularity of the nasal bones, potentially nondisplaced acute fractures. No other evidence of acute maxillofacial fracture. There is some mucosal thickening in the right frontal and left maxillary sinuses. No air-fluid levels are identified. The mastoid air cells and middle ears are clear. TMJ degenerative changes are present bilaterally. There is mild left supraorbital soft tissue swelling. No evidence of orbital hematoma. The globes are intact. The optic nerves and extraocular muscles appear normal. CT CERVICAL SPINE FINDINGS The alignment is normal. There is no evidence of acute fracture or traumatic subluxation. There is mild spondylosis, primarily at C1-2. No acute soft tissue findings or high-grade spinal stenosis demonstrated. Carotid arterial calcifications are present bilaterally. There is also atherosclerosis of the aortic arch. IMPRESSION: 1. Right hemispheric subdural hematoma as described. No resulting midline shift or hydrocephalus. 2. Possible nondisplaced nasal bone fractures. No other acute maxillofacial findings. 3. No evidence of acute cervical  spine fracture, traumatic subluxation or static signs of instability. 4. Critical Value/emergent results were called by telephone at the time of interpretation on 03/21/2016 at 11:20 am to Dr. Fredia Sorrow , who verbally acknowledged these results. Electronically Signed   By: Richardean Sale M.D.   On: 03/21/2016 11:24    Assessment/Plan:    Subdural Hematoma  Patient Doing well neurologically no change.   Repeat CT scan has shown improvement-would benefit from continued therapy for strengthening  Essential Hypertension  Blood pressure appears relatively well controlled recent blood pressures 143/60-116/77-128/73   COPD with Sleep apnea Patient stable on Spiriva and Flonase. As well as ipratropium inhalers 4 times a day. Allergic rhinitis  Continue on Flonase and Zyrtec CKD secondary to Diabetes  Creat Stable at 1.87..  Diabetes type2 not on any hypoglycemics  Last HGA1c was 5.7-this appears to be stable  .  ITP   improvement with a platelet count of 169,000 on lab done September 12 will need follow-up by primary care provider.  Depression this appears stable he is on Celexa.  History of anxiety he does have an order for Xanax 0.5 mg twice a day when necessary this apparently has been  stable as well  Constipation This appears to have stabilized on MiraLAX and Dulcolax when necessary Already on senna.   Patient has been advised to f/u with their PCP in 1-2 weeks to bring them up to date on their rehab stay.  Social services at facility was responsible for arranging this appointment.  Pt was provided with a 30 day supply of prescriptions for medications and refills must be obtained from their PCP.  For controlled substances, a more limited supply may be provided adequate until PCP appointment only.  :   [id number] note grade and 30 minutes spent on this discharge summary-greater than 50% of time spent coordinating plan of care for numerous diagnoses

## 2016-04-14 ENCOUNTER — Telehealth: Payer: Self-pay | Admitting: Family Medicine

## 2016-04-14 ENCOUNTER — Other Ambulatory Visit: Payer: Self-pay | Admitting: Licensed Clinical Social Worker

## 2016-04-14 DIAGNOSIS — S065X0D Traumatic subdural hemorrhage without loss of consciousness, subsequent encounter: Secondary | ICD-10-CM | POA: Diagnosis not present

## 2016-04-14 DIAGNOSIS — D696 Thrombocytopenia, unspecified: Secondary | ICD-10-CM | POA: Diagnosis not present

## 2016-04-14 DIAGNOSIS — Z87891 Personal history of nicotine dependence: Secondary | ICD-10-CM | POA: Diagnosis not present

## 2016-04-14 DIAGNOSIS — Z85118 Personal history of other malignant neoplasm of bronchus and lung: Secondary | ICD-10-CM | POA: Diagnosis not present

## 2016-04-14 DIAGNOSIS — G250 Essential tremor: Secondary | ICD-10-CM | POA: Diagnosis not present

## 2016-04-14 DIAGNOSIS — E785 Hyperlipidemia, unspecified: Secondary | ICD-10-CM | POA: Diagnosis not present

## 2016-04-14 DIAGNOSIS — E1142 Type 2 diabetes mellitus with diabetic polyneuropathy: Secondary | ICD-10-CM | POA: Diagnosis not present

## 2016-04-14 DIAGNOSIS — J449 Chronic obstructive pulmonary disease, unspecified: Secondary | ICD-10-CM | POA: Diagnosis not present

## 2016-04-14 DIAGNOSIS — I129 Hypertensive chronic kidney disease with stage 1 through stage 4 chronic kidney disease, or unspecified chronic kidney disease: Secondary | ICD-10-CM | POA: Diagnosis not present

## 2016-04-14 DIAGNOSIS — I252 Old myocardial infarction: Secondary | ICD-10-CM | POA: Diagnosis not present

## 2016-04-14 DIAGNOSIS — N189 Chronic kidney disease, unspecified: Secondary | ICD-10-CM | POA: Diagnosis not present

## 2016-04-14 DIAGNOSIS — Z8551 Personal history of malignant neoplasm of bladder: Secondary | ICD-10-CM | POA: Diagnosis not present

## 2016-04-14 DIAGNOSIS — J441 Chronic obstructive pulmonary disease with (acute) exacerbation: Secondary | ICD-10-CM

## 2016-04-14 DIAGNOSIS — Z7984 Long term (current) use of oral hypoglycemic drugs: Secondary | ICD-10-CM | POA: Diagnosis not present

## 2016-04-14 DIAGNOSIS — Z9181 History of falling: Secondary | ICD-10-CM | POA: Diagnosis not present

## 2016-04-14 DIAGNOSIS — D693 Immune thrombocytopenic purpura: Secondary | ICD-10-CM | POA: Diagnosis not present

## 2016-04-14 DIAGNOSIS — I2510 Atherosclerotic heart disease of native coronary artery without angina pectoris: Secondary | ICD-10-CM | POA: Diagnosis not present

## 2016-04-14 DIAGNOSIS — E1122 Type 2 diabetes mellitus with diabetic chronic kidney disease: Secondary | ICD-10-CM | POA: Diagnosis not present

## 2016-04-14 NOTE — Telephone Encounter (Signed)
Patient has follow up office visit scheduled 04/18/16. Home Health physical therapy noted his heart rate to be 100 and slightly irregular at their session with patient today.

## 2016-04-14 NOTE — Telephone Encounter (Signed)
Calling to let Dr. Richardson Landry know that patients' heart rate at resting is at 100, but it is slightly irregular.  He said no one has ever told him it was irregular.

## 2016-04-14 NOTE — Telephone Encounter (Signed)
Ntsw, fist ask a few questions re his concerns about heart thythm and rate, document response he should have f u visit sched since just dizch fr n h, make it this wk and we will address then

## 2016-04-14 NOTE — Telephone Encounter (Signed)
ok 

## 2016-04-14 NOTE — Patient Outreach (Signed)
Assessment:  CSW called [facility] on 04/14/16 to inquire about patient status. CSW was informed on 04/14/16  by receptionist at [facility] that client had discharged from facility.  CSW then called home number of client on 04/14/16. CSW spoke via phone with client. CSW verified client identity. CSW received verbal permission from client on 04/14/16 for CSW to speak with client or spouse of client  about current client needs.  Client had been receiving nursing and physical therapy support at [facility].  Client had also been receiving occupational therapy and speech therapy support at [facility].  Client said he is eating well and sleeping well upon returning to his home. He did not mention any pain issues. Client said he has support from his wife and from his son.  Client is receiving in home physical therapy support at present.  Client is just beginning his physical therapy in home support services at present.   Client said that he believes that in home physical therapy services will be helpful to client at present. Client has prescribed medications and is taking medications as prescribed.  CSW made a [facility] nursing referral for client on 9/25/17at request of client and spouse of client. Client said he would like [facility] RN to visit him at home to assist client with client nursing needs. Client has had a history of falls in the past year. CSW spoke with client about client care plan. CSW encouraged client to participate in all scheduled in home physical therapy sessions for client in the next 30 days. Client is using a walker to help him ambulate.  Client did not mention any pain issues.  Client sees Dr. Wolfgang Phoenix as primary doctor. Client has appointment with Dr. Wolfgang Phoenix this Friday.  Client said that he thinks he will also be evaluated in the home for occupational therapy support.  Client is scheduled to see neurologist for appointment in one month.  Client is communicating well and can make his  needs known. [name], spouse of client is very supportive of client.  CSW thanked client and [name] for phone call with CSW on 04/14/16. CSW encouraged client or [name] to call CSW as needed to discuss social work needs of client.      Plan:  Client to participate in all scheduled in home physical therapy sessions for client in next 30 days.  CSW to call client in 4 weeks to assess client needs.  [name].Camora Tremain MSW, LCSW Licensed Clinical Social Worker [facility] Care Management [phone]

## 2016-04-16 ENCOUNTER — Other Ambulatory Visit: Payer: Self-pay | Admitting: *Deleted

## 2016-04-16 ENCOUNTER — Other Ambulatory Visit (HOSPITAL_COMMUNITY): Payer: Self-pay | Admitting: Neurosurgery

## 2016-04-16 ENCOUNTER — Encounter: Payer: Self-pay | Admitting: *Deleted

## 2016-04-16 DIAGNOSIS — D693 Immune thrombocytopenic purpura: Secondary | ICD-10-CM | POA: Diagnosis not present

## 2016-04-16 DIAGNOSIS — N189 Chronic kidney disease, unspecified: Secondary | ICD-10-CM | POA: Diagnosis not present

## 2016-04-16 DIAGNOSIS — I2510 Atherosclerotic heart disease of native coronary artery without angina pectoris: Secondary | ICD-10-CM | POA: Diagnosis not present

## 2016-04-16 DIAGNOSIS — E1142 Type 2 diabetes mellitus with diabetic polyneuropathy: Secondary | ICD-10-CM | POA: Diagnosis not present

## 2016-04-16 DIAGNOSIS — G250 Essential tremor: Secondary | ICD-10-CM | POA: Diagnosis not present

## 2016-04-16 DIAGNOSIS — I129 Hypertensive chronic kidney disease with stage 1 through stage 4 chronic kidney disease, or unspecified chronic kidney disease: Secondary | ICD-10-CM | POA: Diagnosis not present

## 2016-04-16 DIAGNOSIS — Z85118 Personal history of other malignant neoplasm of bronchus and lung: Secondary | ICD-10-CM | POA: Diagnosis not present

## 2016-04-16 DIAGNOSIS — Z8551 Personal history of malignant neoplasm of bladder: Secondary | ICD-10-CM | POA: Diagnosis not present

## 2016-04-16 DIAGNOSIS — D696 Thrombocytopenia, unspecified: Secondary | ICD-10-CM | POA: Diagnosis not present

## 2016-04-16 DIAGNOSIS — I252 Old myocardial infarction: Secondary | ICD-10-CM | POA: Diagnosis not present

## 2016-04-16 DIAGNOSIS — Z87891 Personal history of nicotine dependence: Secondary | ICD-10-CM | POA: Diagnosis not present

## 2016-04-16 DIAGNOSIS — S065X0D Traumatic subdural hemorrhage without loss of consciousness, subsequent encounter: Secondary | ICD-10-CM | POA: Diagnosis not present

## 2016-04-16 DIAGNOSIS — Z7984 Long term (current) use of oral hypoglycemic drugs: Secondary | ICD-10-CM | POA: Diagnosis not present

## 2016-04-16 DIAGNOSIS — J449 Chronic obstructive pulmonary disease, unspecified: Secondary | ICD-10-CM | POA: Diagnosis not present

## 2016-04-16 DIAGNOSIS — S065XAA Traumatic subdural hemorrhage with loss of consciousness status unknown, initial encounter: Secondary | ICD-10-CM

## 2016-04-16 DIAGNOSIS — E1122 Type 2 diabetes mellitus with diabetic chronic kidney disease: Secondary | ICD-10-CM | POA: Diagnosis not present

## 2016-04-16 DIAGNOSIS — Z9181 History of falling: Secondary | ICD-10-CM | POA: Diagnosis not present

## 2016-04-16 DIAGNOSIS — S065X9A Traumatic subdural hemorrhage with loss of consciousness of unspecified duration, initial encounter: Secondary | ICD-10-CM

## 2016-04-16 DIAGNOSIS — E785 Hyperlipidemia, unspecified: Secondary | ICD-10-CM | POA: Diagnosis not present

## 2016-04-16 NOTE — Patient Outreach (Signed)
[facility]) Care Management  04/16/2016  SHOURYA MACPHERSON 03/02/1934 [id number]  Kipp Brood. Gervasi is a very pleasant 82 year old patient who has been followed off and on by [facility] for greater than 3 years. He has multiple chronic medical problems including HTN, DMII, CAD, COPD, and CKD. Mr. Meadowcroft fell earlier this month, sustaining a subdural hematoma and required a 3 day hospital stay. He was transitioned to the nursing facility for rehab and recovery prior to discharging to home this week.   Mr. Butch lives at home with his wife [name] who is his primary caregiver. Mr. Mckimmy is scheduled for follow up with Dr. Vertell Limber and anticipates a repeat CT of his head. His blood pressure medications were adjusted during his hospitalization and he has been monitoring his BP at home. He isn't sure what his pressures have been as his wife checks them and records (she is unavailable). Mr. Sison daily aspirin was discontinued and he is on fall precautions. Mr. Whitenight is also scheduled to see his ENT in follow up because of a nasal bone fracture sustained at the time of his fall.   Plan: Mr. Landfair agrees to a home visit next week to follow up on his progress and care coordination needs.    [facility]  [phone]

## 2016-04-18 ENCOUNTER — Ambulatory Visit (INDEPENDENT_AMBULATORY_CARE_PROVIDER_SITE_OTHER): Payer: Medicare Other | Admitting: Family Medicine

## 2016-04-18 ENCOUNTER — Encounter: Payer: Self-pay | Admitting: Family Medicine

## 2016-04-18 VITALS — BP 128/70 | Ht 72.0 in | Wt 263.0 lb

## 2016-04-18 DIAGNOSIS — D693 Immune thrombocytopenic purpura: Secondary | ICD-10-CM | POA: Diagnosis not present

## 2016-04-18 DIAGNOSIS — G250 Essential tremor: Secondary | ICD-10-CM | POA: Diagnosis not present

## 2016-04-18 DIAGNOSIS — E1142 Type 2 diabetes mellitus with diabetic polyneuropathy: Secondary | ICD-10-CM | POA: Diagnosis not present

## 2016-04-18 DIAGNOSIS — Z85118 Personal history of other malignant neoplasm of bronchus and lung: Secondary | ICD-10-CM | POA: Diagnosis not present

## 2016-04-18 DIAGNOSIS — I10 Essential (primary) hypertension: Secondary | ICD-10-CM | POA: Diagnosis not present

## 2016-04-18 DIAGNOSIS — Z8551 Personal history of malignant neoplasm of bladder: Secondary | ICD-10-CM | POA: Diagnosis not present

## 2016-04-18 DIAGNOSIS — Z7984 Long term (current) use of oral hypoglycemic drugs: Secondary | ICD-10-CM | POA: Diagnosis not present

## 2016-04-18 DIAGNOSIS — E119 Type 2 diabetes mellitus without complications: Secondary | ICD-10-CM

## 2016-04-18 DIAGNOSIS — I129 Hypertensive chronic kidney disease with stage 1 through stage 4 chronic kidney disease, or unspecified chronic kidney disease: Secondary | ICD-10-CM | POA: Diagnosis not present

## 2016-04-18 DIAGNOSIS — Z23 Encounter for immunization: Secondary | ICD-10-CM | POA: Diagnosis not present

## 2016-04-18 DIAGNOSIS — I252 Old myocardial infarction: Secondary | ICD-10-CM | POA: Diagnosis not present

## 2016-04-18 DIAGNOSIS — E1122 Type 2 diabetes mellitus with diabetic chronic kidney disease: Secondary | ICD-10-CM | POA: Diagnosis not present

## 2016-04-18 DIAGNOSIS — J449 Chronic obstructive pulmonary disease, unspecified: Secondary | ICD-10-CM | POA: Diagnosis not present

## 2016-04-18 DIAGNOSIS — I2510 Atherosclerotic heart disease of native coronary artery without angina pectoris: Secondary | ICD-10-CM | POA: Diagnosis not present

## 2016-04-18 DIAGNOSIS — E785 Hyperlipidemia, unspecified: Secondary | ICD-10-CM

## 2016-04-18 DIAGNOSIS — Z87891 Personal history of nicotine dependence: Secondary | ICD-10-CM | POA: Diagnosis not present

## 2016-04-18 DIAGNOSIS — D696 Thrombocytopenia, unspecified: Secondary | ICD-10-CM | POA: Diagnosis not present

## 2016-04-18 DIAGNOSIS — S065X0D Traumatic subdural hemorrhage without loss of consciousness, subsequent encounter: Secondary | ICD-10-CM | POA: Diagnosis not present

## 2016-04-18 DIAGNOSIS — Z9181 History of falling: Secondary | ICD-10-CM | POA: Diagnosis not present

## 2016-04-18 DIAGNOSIS — N189 Chronic kidney disease, unspecified: Secondary | ICD-10-CM | POA: Diagnosis not present

## 2016-04-18 MED ORDER — NITROGLYCERIN 0.4 MG SL SUBL: 0.4000 mg | SUBLINGUAL_TABLET | SUBLINGUAL | 0 refills | Status: AC | PRN

## 2016-04-18 NOTE — Progress Notes (Signed)
   Subjective:  Note patient was contacted within 48 hours of discharge. Medications were clarified. Conditional status was discussed and follow-up appointment was concerned may  Patient ID: Kyle [name], male    DOB: June 23, 1934, 82 y.o.   MRN: [id number]  [facility] [address] and hit his head. Suffered a subdural hematoma. Had multiple scans. He appears to stabilize and shrinking. Notes slight nasal congestion at site of fracture but no headache  Concerns about stomach issues. Several bowl movements a day.   Flu vaccine today.   Pulse irregular and fast one time. Now seems to be doing better.    Was taken off of bp meds. Lisinopril and metoprolol. Blood pressure continues to remain good.  Needs reill on nitroglycerin. Has not ever taken but needs refill to keep on hand   Review of Systems No headache, no major weight loss or weight gain, no chest pain no back pain abdominal pain no change in bowel habits complete ROS otherwise negative     Objective:   Physical Exam Alert substantial obesity present HEENT moderate nasal congestion TMs good neck supple. Lungs clear. Heart regular rhythm abdomen large no discrete tenderness ankles no significant edema       Assessment & Plan:  Impression 1 status post subdural hematoma clinically improved #2 hypertension good off meds discussed #3 type 2 diabetes mellitus #4 history of thrombocytopenia. This is relatively stable over last several years. We'll hold off on another CBC at this time with patient's request plan follow-up in 3 months. Diet exercise discussed. Flu shot today. Warning signs discussed.

## 2016-04-19 DIAGNOSIS — J449 Chronic obstructive pulmonary disease, unspecified: Secondary | ICD-10-CM | POA: Diagnosis not present

## 2016-04-21 DIAGNOSIS — E1151 Type 2 diabetes mellitus with diabetic peripheral angiopathy without gangrene: Secondary | ICD-10-CM | POA: Diagnosis not present

## 2016-04-21 DIAGNOSIS — B351 Tinea unguium: Secondary | ICD-10-CM | POA: Diagnosis not present

## 2016-04-21 DIAGNOSIS — L600 Ingrowing nail: Secondary | ICD-10-CM | POA: Diagnosis not present

## 2016-04-21 DIAGNOSIS — E1140 Type 2 diabetes mellitus with diabetic neuropathy, unspecified: Secondary | ICD-10-CM | POA: Diagnosis not present

## 2016-04-22 ENCOUNTER — Other Ambulatory Visit: Payer: Self-pay | Admitting: *Deleted

## 2016-04-22 ENCOUNTER — Encounter: Payer: Self-pay | Admitting: *Deleted

## 2016-04-22 DIAGNOSIS — J449 Chronic obstructive pulmonary disease, unspecified: Secondary | ICD-10-CM | POA: Diagnosis not present

## 2016-04-22 DIAGNOSIS — Z8551 Personal history of malignant neoplasm of bladder: Secondary | ICD-10-CM | POA: Diagnosis not present

## 2016-04-22 DIAGNOSIS — E785 Hyperlipidemia, unspecified: Secondary | ICD-10-CM | POA: Diagnosis not present

## 2016-04-22 DIAGNOSIS — E1142 Type 2 diabetes mellitus with diabetic polyneuropathy: Secondary | ICD-10-CM | POA: Diagnosis not present

## 2016-04-22 DIAGNOSIS — N189 Chronic kidney disease, unspecified: Secondary | ICD-10-CM | POA: Diagnosis not present

## 2016-04-22 DIAGNOSIS — Z85118 Personal history of other malignant neoplasm of bronchus and lung: Secondary | ICD-10-CM | POA: Diagnosis not present

## 2016-04-22 DIAGNOSIS — Z7984 Long term (current) use of oral hypoglycemic drugs: Secondary | ICD-10-CM | POA: Diagnosis not present

## 2016-04-22 DIAGNOSIS — I252 Old myocardial infarction: Secondary | ICD-10-CM | POA: Diagnosis not present

## 2016-04-22 DIAGNOSIS — Z9181 History of falling: Secondary | ICD-10-CM | POA: Diagnosis not present

## 2016-04-22 DIAGNOSIS — S065X0D Traumatic subdural hemorrhage without loss of consciousness, subsequent encounter: Secondary | ICD-10-CM | POA: Diagnosis not present

## 2016-04-22 DIAGNOSIS — D693 Immune thrombocytopenic purpura: Secondary | ICD-10-CM | POA: Diagnosis not present

## 2016-04-22 DIAGNOSIS — D696 Thrombocytopenia, unspecified: Secondary | ICD-10-CM | POA: Diagnosis not present

## 2016-04-22 DIAGNOSIS — Z87891 Personal history of nicotine dependence: Secondary | ICD-10-CM | POA: Diagnosis not present

## 2016-04-22 DIAGNOSIS — G250 Essential tremor: Secondary | ICD-10-CM | POA: Diagnosis not present

## 2016-04-22 DIAGNOSIS — E1122 Type 2 diabetes mellitus with diabetic chronic kidney disease: Secondary | ICD-10-CM | POA: Diagnosis not present

## 2016-04-22 DIAGNOSIS — I2510 Atherosclerotic heart disease of native coronary artery without angina pectoris: Secondary | ICD-10-CM | POA: Diagnosis not present

## 2016-04-22 DIAGNOSIS — I129 Hypertensive chronic kidney disease with stage 1 through stage 4 chronic kidney disease, or unspecified chronic kidney disease: Secondary | ICD-10-CM | POA: Diagnosis not present

## 2016-04-22 NOTE — Patient Outreach (Signed)
[facility]) Care Management   04/22/2016  THEDFORD [name] [birth year] [id number]  Kyle [name] is an 82 y.o. very pleasant 82 year old patient who has been followed off and on by [facility] for greater than 3 years. He has multiple chronic medical problems including HTN, DMII, CAD, COPD, and CKD. Kyle [name] fell earlier this month, sustaining a subdural hematoma and required a 3 day hospital stay. He was transitioned to the nursing facility for rehab and recovery prior to discharging to home last week.   Kyle [name] lives at home with his wife Kyle [name] who is his primary caregiver. Kyle [name] is scheduled for follow up with Dr. Vertell Limber and anticipates a repeat CT of his head. His blood pressure medications were adjusted during his hospitalization and he has been monitoring his BP at home. He isn't sure what his pressures have been as his wife checks them and records (she is unavailable). Kyle [name] daily aspirin was discontinued and he is on fall precautions. Kyle [name] is also scheduled to see his ENT in follow up because of a nasal bone fracture sustained at the time of his fall.   Subjective: "I feel pretty good all except I don't quite have my strength back yet and I have to use this walker for now."   Objective:  BP 134/78   Pulse 78   Wt 266 lb (120.7 kg)   SpO2 95%   BMI 36.08 kg/m   Review of Systems  Constitutional: Negative.   HENT: Negative.   Eyes: Negative.   Respiratory: Negative.   Cardiovascular: Negative.   Gastrointestinal: Negative.   Genitourinary: Negative.   Musculoskeletal: Negative.  Negative for falls.  Skin: Negative.   Neurological: Negative for dizziness, tingling and loss of consciousness.  Psychiatric/Behavioral: Negative.     Physical Exam  Constitutional: He is oriented to person, place, and time. He appears well-developed and well-nourished. He is active.  Non-toxic appearance. He does not appear ill.  Cardiovascular: Normal rate  and regular rhythm.   Respiratory: Effort normal and breath sounds normal. He has no wheezes. He has no rhonchi. He has no rales.  GI: Soft. Normal appearance and bowel sounds are normal. There is no tenderness.  Neurological: He is alert and oriented to person, place, and time.  Skin: Skin is warm, dry and intact.  Psychiatric: He has a normal mood and affect. His speech is normal and behavior is normal. Judgment and thought content normal. Cognition and memory are normal.    Encounter Medications:   Outpatient Encounter Prescriptions as of 04/22/2016  Medication Sig  . albuterol (PROAIR HFA) 108 (90 BASE) MCG/ACT inhaler Inhale 2 puffs into the lungs every 6 (six) hours as needed for wheezing.  Marland Kitchen ALPRAZolam (XANAX) 0.5 MG tablet TAKE ONE TABLET TWICE DAILY AS NEEDED FOR ANXIETY (Patient not taking: Reported on 04/18/2016)  . calcitRIOL (ROCALTROL) 0.25 MCG capsule Take 0.25 mcg by mouth daily.   . cetirizine (ZYRTEC) 10 MG tablet Take 1 tablet (10 mg total) by mouth daily.  . citalopram (CELEXA) 20 MG tablet Take 20 mg by mouth daily.  . fluticasone (FLONASE) 50 MCG/ACT nasal spray Place 1 spray into both nostrils daily.  Marland Kitchen ipratropium (ATROVENT) 0.06 % nasal spray Place 2 sprays into both nostrils 4 (four) times daily.  . niacin (NIASPAN) 500 MG CR tablet Take 2 tablets (1,000 mg total) by mouth at bedtime.  . nitroGLYCERIN (NITROSTAT) 0.4 MG SL tablet Place 1 tablet (0.4 mg  total) under the tongue every 5 (five) minutes as needed. Call MD if need more than 2  . polyethylene glycol (MIRALAX / GLYCOLAX) packet Take 17 g by mouth daily.  . pravastatin (PRAVACHOL) 20 MG tablet Take 20 mg by mouth daily.  Marland Kitchen SPIRIVA HANDIHALER 18 MCG inhalation capsule INHALE 1 DOSE BY MOUTH ONCE DAILY  . vitamin B-12 1000 MCG tablet Take 1 tablet (1,000 mcg total) by mouth daily.   Functional Status:   In your present state of health, do you have any difficulty performing the following activities: 04/02/2016  03/22/2016  Hearing? Y N  Vision? Y N  Difficulty concentrating or making decisions? Y N  Walking or climbing stairs? Y Y  Dressing or bathing? N N  Doing errands, shopping? N N  Preparing Food and eating ? N -  Using the Toilet? N -  In the past six months, have you accidently leaked urine? N -  Do you have problems with loss of bowel control? N -  Managing your Medications? N -  Managing your Finances? N -  Housekeeping or managing your Housekeeping? N -  Some recent data might be hidden    Fall/Depression Screening:    PHQ 2/9 Scores 04/02/2016 04/02/2016 03/13/2016 02/15/2016 12/07/2015 10/26/2015 08/30/2015  PHQ - 2 Score 0 0 0 0 0 0 0    Assessment:  82 year old gentleman living in his home in [place] with his very supportive wife. Recently was hospitalized after a fall where he sustained a SDH. Kyle [name] spent a short time at the skilled nursing facility for rehab and has discharged to home. He is doing very well and he and his wife feel confident about his progress. Kyle [name] is excellent and he is self monitoring cbg's and BP at home. As well, he is adhering to his prescribed medication regimen and is following his appointment schedule. Kyle [name] is working with home health PT and using his walker for ambulation. He has not experienced any more falls or episodes of lightheadedness or dizziness.   Plan:  I will follow up with Kyle [name] by phone in a few weeks and again at home next month to ensure that he continues to progress. He and his wife have my contact information and will call for new or worsening symptoms or new concerns.   Upcoming Appointments: Vertell Limber 05/07/16 Tafe[place] 05/06/16 Davis (Urology, [facility]) 06/09/16 Stem (Renal, [facility])06/30/16 Plonk (Vascular, [facility]) 07/03/16 Luking (primary care) 07/18/16   [facility]  [phone]

## 2016-04-23 DIAGNOSIS — Z9181 History of falling: Secondary | ICD-10-CM | POA: Diagnosis not present

## 2016-04-23 DIAGNOSIS — Z8551 Personal history of malignant neoplasm of bladder: Secondary | ICD-10-CM | POA: Diagnosis not present

## 2016-04-23 DIAGNOSIS — E1142 Type 2 diabetes mellitus with diabetic polyneuropathy: Secondary | ICD-10-CM | POA: Diagnosis not present

## 2016-04-23 DIAGNOSIS — D693 Immune thrombocytopenic purpura: Secondary | ICD-10-CM | POA: Diagnosis not present

## 2016-04-23 DIAGNOSIS — Z7984 Long term (current) use of oral hypoglycemic drugs: Secondary | ICD-10-CM | POA: Diagnosis not present

## 2016-04-23 DIAGNOSIS — D696 Thrombocytopenia, unspecified: Secondary | ICD-10-CM | POA: Diagnosis not present

## 2016-04-23 DIAGNOSIS — J449 Chronic obstructive pulmonary disease, unspecified: Secondary | ICD-10-CM | POA: Diagnosis not present

## 2016-04-23 DIAGNOSIS — I129 Hypertensive chronic kidney disease with stage 1 through stage 4 chronic kidney disease, or unspecified chronic kidney disease: Secondary | ICD-10-CM | POA: Diagnosis not present

## 2016-04-23 DIAGNOSIS — Z85118 Personal history of other malignant neoplasm of bronchus and lung: Secondary | ICD-10-CM | POA: Diagnosis not present

## 2016-04-23 DIAGNOSIS — Z87891 Personal history of nicotine dependence: Secondary | ICD-10-CM | POA: Diagnosis not present

## 2016-04-23 DIAGNOSIS — I252 Old myocardial infarction: Secondary | ICD-10-CM | POA: Diagnosis not present

## 2016-04-23 DIAGNOSIS — G250 Essential tremor: Secondary | ICD-10-CM | POA: Diagnosis not present

## 2016-04-23 DIAGNOSIS — S065X0D Traumatic subdural hemorrhage without loss of consciousness, subsequent encounter: Secondary | ICD-10-CM | POA: Diagnosis not present

## 2016-04-23 DIAGNOSIS — I2510 Atherosclerotic heart disease of native coronary artery without angina pectoris: Secondary | ICD-10-CM | POA: Diagnosis not present

## 2016-04-23 DIAGNOSIS — E1122 Type 2 diabetes mellitus with diabetic chronic kidney disease: Secondary | ICD-10-CM | POA: Diagnosis not present

## 2016-04-23 DIAGNOSIS — E785 Hyperlipidemia, unspecified: Secondary | ICD-10-CM | POA: Diagnosis not present

## 2016-04-23 DIAGNOSIS — N189 Chronic kidney disease, unspecified: Secondary | ICD-10-CM | POA: Diagnosis not present

## 2016-04-24 ENCOUNTER — Ambulatory Visit: Payer: Medicare Other | Admitting: Family Medicine

## 2016-04-24 DIAGNOSIS — D693 Immune thrombocytopenic purpura: Secondary | ICD-10-CM | POA: Diagnosis not present

## 2016-04-24 DIAGNOSIS — Z87891 Personal history of nicotine dependence: Secondary | ICD-10-CM | POA: Diagnosis not present

## 2016-04-24 DIAGNOSIS — Z9181 History of falling: Secondary | ICD-10-CM | POA: Diagnosis not present

## 2016-04-24 DIAGNOSIS — D696 Thrombocytopenia, unspecified: Secondary | ICD-10-CM | POA: Diagnosis not present

## 2016-04-24 DIAGNOSIS — E785 Hyperlipidemia, unspecified: Secondary | ICD-10-CM | POA: Diagnosis not present

## 2016-04-24 DIAGNOSIS — N189 Chronic kidney disease, unspecified: Secondary | ICD-10-CM | POA: Diagnosis not present

## 2016-04-24 DIAGNOSIS — S065X0D Traumatic subdural hemorrhage without loss of consciousness, subsequent encounter: Secondary | ICD-10-CM | POA: Diagnosis not present

## 2016-04-24 DIAGNOSIS — Z85118 Personal history of other malignant neoplasm of bronchus and lung: Secondary | ICD-10-CM | POA: Diagnosis not present

## 2016-04-24 DIAGNOSIS — E1142 Type 2 diabetes mellitus with diabetic polyneuropathy: Secondary | ICD-10-CM | POA: Diagnosis not present

## 2016-04-24 DIAGNOSIS — Z7984 Long term (current) use of oral hypoglycemic drugs: Secondary | ICD-10-CM | POA: Diagnosis not present

## 2016-04-24 DIAGNOSIS — G250 Essential tremor: Secondary | ICD-10-CM | POA: Diagnosis not present

## 2016-04-24 DIAGNOSIS — I129 Hypertensive chronic kidney disease with stage 1 through stage 4 chronic kidney disease, or unspecified chronic kidney disease: Secondary | ICD-10-CM | POA: Diagnosis not present

## 2016-04-24 DIAGNOSIS — E1122 Type 2 diabetes mellitus with diabetic chronic kidney disease: Secondary | ICD-10-CM | POA: Diagnosis not present

## 2016-04-24 DIAGNOSIS — I252 Old myocardial infarction: Secondary | ICD-10-CM | POA: Diagnosis not present

## 2016-04-24 DIAGNOSIS — Z8551 Personal history of malignant neoplasm of bladder: Secondary | ICD-10-CM | POA: Diagnosis not present

## 2016-04-24 DIAGNOSIS — J449 Chronic obstructive pulmonary disease, unspecified: Secondary | ICD-10-CM | POA: Diagnosis not present

## 2016-04-24 DIAGNOSIS — I2510 Atherosclerotic heart disease of native coronary artery without angina pectoris: Secondary | ICD-10-CM | POA: Diagnosis not present

## 2016-04-25 DIAGNOSIS — Z85118 Personal history of other malignant neoplasm of bronchus and lung: Secondary | ICD-10-CM | POA: Diagnosis not present

## 2016-04-25 DIAGNOSIS — Z7984 Long term (current) use of oral hypoglycemic drugs: Secondary | ICD-10-CM | POA: Diagnosis not present

## 2016-04-25 DIAGNOSIS — I129 Hypertensive chronic kidney disease with stage 1 through stage 4 chronic kidney disease, or unspecified chronic kidney disease: Secondary | ICD-10-CM | POA: Diagnosis not present

## 2016-04-25 DIAGNOSIS — E1122 Type 2 diabetes mellitus with diabetic chronic kidney disease: Secondary | ICD-10-CM | POA: Diagnosis not present

## 2016-04-25 DIAGNOSIS — E1142 Type 2 diabetes mellitus with diabetic polyneuropathy: Secondary | ICD-10-CM | POA: Diagnosis not present

## 2016-04-25 DIAGNOSIS — D693 Immune thrombocytopenic purpura: Secondary | ICD-10-CM | POA: Diagnosis not present

## 2016-04-25 DIAGNOSIS — I252 Old myocardial infarction: Secondary | ICD-10-CM | POA: Diagnosis not present

## 2016-04-25 DIAGNOSIS — D696 Thrombocytopenia, unspecified: Secondary | ICD-10-CM | POA: Diagnosis not present

## 2016-04-25 DIAGNOSIS — G250 Essential tremor: Secondary | ICD-10-CM | POA: Diagnosis not present

## 2016-04-25 DIAGNOSIS — S065X0D Traumatic subdural hemorrhage without loss of consciousness, subsequent encounter: Secondary | ICD-10-CM | POA: Diagnosis not present

## 2016-04-25 DIAGNOSIS — E785 Hyperlipidemia, unspecified: Secondary | ICD-10-CM | POA: Diagnosis not present

## 2016-04-25 DIAGNOSIS — J449 Chronic obstructive pulmonary disease, unspecified: Secondary | ICD-10-CM | POA: Diagnosis not present

## 2016-04-25 DIAGNOSIS — I2510 Atherosclerotic heart disease of native coronary artery without angina pectoris: Secondary | ICD-10-CM | POA: Diagnosis not present

## 2016-04-25 DIAGNOSIS — Z9181 History of falling: Secondary | ICD-10-CM | POA: Diagnosis not present

## 2016-04-25 DIAGNOSIS — N189 Chronic kidney disease, unspecified: Secondary | ICD-10-CM | POA: Diagnosis not present

## 2016-04-25 DIAGNOSIS — Z8551 Personal history of malignant neoplasm of bladder: Secondary | ICD-10-CM | POA: Diagnosis not present

## 2016-04-25 DIAGNOSIS — Z87891 Personal history of nicotine dependence: Secondary | ICD-10-CM | POA: Diagnosis not present

## 2016-04-28 DIAGNOSIS — C680 Malignant neoplasm of urethra: Secondary | ICD-10-CM | POA: Diagnosis not present

## 2016-04-28 DIAGNOSIS — Z923 Personal history of irradiation: Secondary | ICD-10-CM | POA: Diagnosis not present

## 2016-04-28 DIAGNOSIS — Z85118 Personal history of other malignant neoplasm of bronchus and lung: Secondary | ICD-10-CM | POA: Diagnosis not present

## 2016-04-29 DIAGNOSIS — S065X0D Traumatic subdural hemorrhage without loss of consciousness, subsequent encounter: Secondary | ICD-10-CM | POA: Diagnosis not present

## 2016-04-29 DIAGNOSIS — I129 Hypertensive chronic kidney disease with stage 1 through stage 4 chronic kidney disease, or unspecified chronic kidney disease: Secondary | ICD-10-CM | POA: Diagnosis not present

## 2016-04-29 DIAGNOSIS — Z87891 Personal history of nicotine dependence: Secondary | ICD-10-CM | POA: Diagnosis not present

## 2016-04-29 DIAGNOSIS — G250 Essential tremor: Secondary | ICD-10-CM | POA: Diagnosis not present

## 2016-04-29 DIAGNOSIS — D693 Immune thrombocytopenic purpura: Secondary | ICD-10-CM | POA: Diagnosis not present

## 2016-04-29 DIAGNOSIS — D696 Thrombocytopenia, unspecified: Secondary | ICD-10-CM | POA: Diagnosis not present

## 2016-04-29 DIAGNOSIS — E1122 Type 2 diabetes mellitus with diabetic chronic kidney disease: Secondary | ICD-10-CM | POA: Diagnosis not present

## 2016-04-29 DIAGNOSIS — N189 Chronic kidney disease, unspecified: Secondary | ICD-10-CM | POA: Diagnosis not present

## 2016-04-29 DIAGNOSIS — J449 Chronic obstructive pulmonary disease, unspecified: Secondary | ICD-10-CM | POA: Diagnosis not present

## 2016-04-29 DIAGNOSIS — Z8551 Personal history of malignant neoplasm of bladder: Secondary | ICD-10-CM | POA: Diagnosis not present

## 2016-04-29 DIAGNOSIS — I252 Old myocardial infarction: Secondary | ICD-10-CM | POA: Diagnosis not present

## 2016-04-29 DIAGNOSIS — Z7984 Long term (current) use of oral hypoglycemic drugs: Secondary | ICD-10-CM | POA: Diagnosis not present

## 2016-04-29 DIAGNOSIS — E1142 Type 2 diabetes mellitus with diabetic polyneuropathy: Secondary | ICD-10-CM | POA: Diagnosis not present

## 2016-04-29 DIAGNOSIS — Z85118 Personal history of other malignant neoplasm of bronchus and lung: Secondary | ICD-10-CM | POA: Diagnosis not present

## 2016-04-29 DIAGNOSIS — Z9181 History of falling: Secondary | ICD-10-CM | POA: Diagnosis not present

## 2016-04-29 DIAGNOSIS — I2510 Atherosclerotic heart disease of native coronary artery without angina pectoris: Secondary | ICD-10-CM | POA: Diagnosis not present

## 2016-04-29 DIAGNOSIS — E785 Hyperlipidemia, unspecified: Secondary | ICD-10-CM | POA: Diagnosis not present

## 2016-05-01 ENCOUNTER — Telehealth: Payer: Self-pay | Admitting: Family Medicine

## 2016-05-01 DIAGNOSIS — Z85118 Personal history of other malignant neoplasm of bronchus and lung: Secondary | ICD-10-CM | POA: Diagnosis not present

## 2016-05-01 DIAGNOSIS — Z7984 Long term (current) use of oral hypoglycemic drugs: Secondary | ICD-10-CM | POA: Diagnosis not present

## 2016-05-01 DIAGNOSIS — N189 Chronic kidney disease, unspecified: Secondary | ICD-10-CM | POA: Diagnosis not present

## 2016-05-01 DIAGNOSIS — I252 Old myocardial infarction: Secondary | ICD-10-CM | POA: Diagnosis not present

## 2016-05-01 DIAGNOSIS — I129 Hypertensive chronic kidney disease with stage 1 through stage 4 chronic kidney disease, or unspecified chronic kidney disease: Secondary | ICD-10-CM | POA: Diagnosis not present

## 2016-05-01 DIAGNOSIS — Z87891 Personal history of nicotine dependence: Secondary | ICD-10-CM | POA: Diagnosis not present

## 2016-05-01 DIAGNOSIS — I2510 Atherosclerotic heart disease of native coronary artery without angina pectoris: Secondary | ICD-10-CM | POA: Diagnosis not present

## 2016-05-01 DIAGNOSIS — E785 Hyperlipidemia, unspecified: Secondary | ICD-10-CM | POA: Diagnosis not present

## 2016-05-01 DIAGNOSIS — E1122 Type 2 diabetes mellitus with diabetic chronic kidney disease: Secondary | ICD-10-CM | POA: Diagnosis not present

## 2016-05-01 DIAGNOSIS — E1142 Type 2 diabetes mellitus with diabetic polyneuropathy: Secondary | ICD-10-CM | POA: Diagnosis not present

## 2016-05-01 DIAGNOSIS — D696 Thrombocytopenia, unspecified: Secondary | ICD-10-CM | POA: Diagnosis not present

## 2016-05-01 DIAGNOSIS — S065X0D Traumatic subdural hemorrhage without loss of consciousness, subsequent encounter: Secondary | ICD-10-CM | POA: Diagnosis not present

## 2016-05-01 DIAGNOSIS — G250 Essential tremor: Secondary | ICD-10-CM | POA: Diagnosis not present

## 2016-05-01 DIAGNOSIS — D693 Immune thrombocytopenic purpura: Secondary | ICD-10-CM | POA: Diagnosis not present

## 2016-05-01 DIAGNOSIS — J449 Chronic obstructive pulmonary disease, unspecified: Secondary | ICD-10-CM | POA: Diagnosis not present

## 2016-05-01 DIAGNOSIS — Z8551 Personal history of malignant neoplasm of bladder: Secondary | ICD-10-CM | POA: Diagnosis not present

## 2016-05-01 DIAGNOSIS — Z9181 History of falling: Secondary | ICD-10-CM | POA: Diagnosis not present

## 2016-05-01 NOTE — Telephone Encounter (Signed)
Left message on voicemail notifying Stacy doctor approved verbal order for PT.

## 2016-05-01 NOTE — Telephone Encounter (Signed)
Please give verbal order for this

## 2016-05-01 NOTE — Telephone Encounter (Signed)
Stacy, PT at [facility], wants verbal order to extend PT for 2 times next week.

## 2016-05-05 DIAGNOSIS — D693 Immune thrombocytopenic purpura: Secondary | ICD-10-CM | POA: Diagnosis not present

## 2016-05-05 DIAGNOSIS — D696 Thrombocytopenia, unspecified: Secondary | ICD-10-CM | POA: Diagnosis not present

## 2016-05-05 DIAGNOSIS — Z7984 Long term (current) use of oral hypoglycemic drugs: Secondary | ICD-10-CM | POA: Diagnosis not present

## 2016-05-05 DIAGNOSIS — J449 Chronic obstructive pulmonary disease, unspecified: Secondary | ICD-10-CM | POA: Diagnosis not present

## 2016-05-05 DIAGNOSIS — E1142 Type 2 diabetes mellitus with diabetic polyneuropathy: Secondary | ICD-10-CM | POA: Diagnosis not present

## 2016-05-05 DIAGNOSIS — Z8551 Personal history of malignant neoplasm of bladder: Secondary | ICD-10-CM | POA: Diagnosis not present

## 2016-05-05 DIAGNOSIS — Z9181 History of falling: Secondary | ICD-10-CM | POA: Diagnosis not present

## 2016-05-05 DIAGNOSIS — Z87891 Personal history of nicotine dependence: Secondary | ICD-10-CM | POA: Diagnosis not present

## 2016-05-05 DIAGNOSIS — I252 Old myocardial infarction: Secondary | ICD-10-CM | POA: Diagnosis not present

## 2016-05-05 DIAGNOSIS — Z85118 Personal history of other malignant neoplasm of bronchus and lung: Secondary | ICD-10-CM | POA: Diagnosis not present

## 2016-05-05 DIAGNOSIS — G250 Essential tremor: Secondary | ICD-10-CM | POA: Diagnosis not present

## 2016-05-05 DIAGNOSIS — S065X0D Traumatic subdural hemorrhage without loss of consciousness, subsequent encounter: Secondary | ICD-10-CM | POA: Diagnosis not present

## 2016-05-05 DIAGNOSIS — E1122 Type 2 diabetes mellitus with diabetic chronic kidney disease: Secondary | ICD-10-CM | POA: Diagnosis not present

## 2016-05-05 DIAGNOSIS — I129 Hypertensive chronic kidney disease with stage 1 through stage 4 chronic kidney disease, or unspecified chronic kidney disease: Secondary | ICD-10-CM | POA: Diagnosis not present

## 2016-05-05 DIAGNOSIS — N189 Chronic kidney disease, unspecified: Secondary | ICD-10-CM | POA: Diagnosis not present

## 2016-05-05 DIAGNOSIS — I2510 Atherosclerotic heart disease of native coronary artery without angina pectoris: Secondary | ICD-10-CM | POA: Diagnosis not present

## 2016-05-05 DIAGNOSIS — E785 Hyperlipidemia, unspecified: Secondary | ICD-10-CM | POA: Diagnosis not present

## 2016-05-06 ENCOUNTER — Other Ambulatory Visit: Payer: Self-pay | Admitting: Dermatology

## 2016-05-06 DIAGNOSIS — C44212 Basal cell carcinoma of skin of right ear and external auricular canal: Secondary | ICD-10-CM | POA: Diagnosis not present

## 2016-05-06 DIAGNOSIS — L821 Other seborrheic keratosis: Secondary | ICD-10-CM | POA: Diagnosis not present

## 2016-05-06 DIAGNOSIS — D239 Other benign neoplasm of skin, unspecified: Secondary | ICD-10-CM | POA: Diagnosis not present

## 2016-05-06 DIAGNOSIS — L719 Rosacea, unspecified: Secondary | ICD-10-CM | POA: Diagnosis not present

## 2016-05-07 ENCOUNTER — Ambulatory Visit (HOSPITAL_COMMUNITY)
Admission: RE | Admit: 2016-05-07 | Discharge: 2016-05-07 | Disposition: A | Discharge status: Home / Self Care | Payer: Medicare Other | Source: Ambulatory Visit | Attending: Neurosurgery | Admitting: Neurosurgery

## 2016-05-07 DIAGNOSIS — S065X9A Traumatic subdural hemorrhage with loss of consciousness of unspecified duration, initial encounter: Secondary | ICD-10-CM

## 2016-05-07 DIAGNOSIS — I252 Old myocardial infarction: Secondary | ICD-10-CM | POA: Diagnosis not present

## 2016-05-07 DIAGNOSIS — Z9181 History of falling: Secondary | ICD-10-CM | POA: Diagnosis not present

## 2016-05-07 DIAGNOSIS — G250 Essential tremor: Secondary | ICD-10-CM | POA: Diagnosis not present

## 2016-05-07 DIAGNOSIS — S065X0A Traumatic subdural hemorrhage without loss of consciousness, initial encounter: Secondary | ICD-10-CM | POA: Diagnosis not present

## 2016-05-07 DIAGNOSIS — J449 Chronic obstructive pulmonary disease, unspecified: Secondary | ICD-10-CM | POA: Diagnosis not present

## 2016-05-07 DIAGNOSIS — E785 Hyperlipidemia, unspecified: Secondary | ICD-10-CM | POA: Diagnosis not present

## 2016-05-07 DIAGNOSIS — E1142 Type 2 diabetes mellitus with diabetic polyneuropathy: Secondary | ICD-10-CM | POA: Diagnosis not present

## 2016-05-07 DIAGNOSIS — I2510 Atherosclerotic heart disease of native coronary artery without angina pectoris: Secondary | ICD-10-CM | POA: Diagnosis not present

## 2016-05-07 DIAGNOSIS — D696 Thrombocytopenia, unspecified: Secondary | ICD-10-CM | POA: Diagnosis not present

## 2016-05-07 DIAGNOSIS — W19XXXD Unspecified fall, subsequent encounter: Secondary | ICD-10-CM | POA: Diagnosis not present

## 2016-05-07 DIAGNOSIS — I6200 Nontraumatic subdural hemorrhage, unspecified: Secondary | ICD-10-CM | POA: Insufficient documentation

## 2016-05-07 DIAGNOSIS — E1122 Type 2 diabetes mellitus with diabetic chronic kidney disease: Secondary | ICD-10-CM | POA: Diagnosis not present

## 2016-05-07 DIAGNOSIS — Z87891 Personal history of nicotine dependence: Secondary | ICD-10-CM | POA: Diagnosis not present

## 2016-05-07 DIAGNOSIS — Z7984 Long term (current) use of oral hypoglycemic drugs: Secondary | ICD-10-CM | POA: Diagnosis not present

## 2016-05-07 DIAGNOSIS — D693 Immune thrombocytopenic purpura: Secondary | ICD-10-CM | POA: Diagnosis not present

## 2016-05-07 DIAGNOSIS — I129 Hypertensive chronic kidney disease with stage 1 through stage 4 chronic kidney disease, or unspecified chronic kidney disease: Secondary | ICD-10-CM | POA: Diagnosis not present

## 2016-05-07 DIAGNOSIS — I10 Essential (primary) hypertension: Secondary | ICD-10-CM | POA: Diagnosis not present

## 2016-05-07 DIAGNOSIS — Z85118 Personal history of other malignant neoplasm of bronchus and lung: Secondary | ICD-10-CM | POA: Diagnosis not present

## 2016-05-07 DIAGNOSIS — Z8551 Personal history of malignant neoplasm of bladder: Secondary | ICD-10-CM | POA: Diagnosis not present

## 2016-05-07 DIAGNOSIS — S065X0D Traumatic subdural hemorrhage without loss of consciousness, subsequent encounter: Secondary | ICD-10-CM | POA: Diagnosis not present

## 2016-05-07 DIAGNOSIS — S065XAA Traumatic subdural hemorrhage with loss of consciousness status unknown, initial encounter: Secondary | ICD-10-CM

## 2016-05-07 DIAGNOSIS — N189 Chronic kidney disease, unspecified: Secondary | ICD-10-CM | POA: Diagnosis not present

## 2016-05-12 ENCOUNTER — Other Ambulatory Visit: Payer: Self-pay | Admitting: Family Medicine

## 2016-05-14 ENCOUNTER — Other Ambulatory Visit: Payer: Self-pay | Admitting: Family Medicine

## 2016-05-15 ENCOUNTER — Other Ambulatory Visit: Payer: Self-pay | Admitting: Licensed Clinical Social Worker

## 2016-05-15 NOTE — Patient Outreach (Signed)
Assessment:  CSW spoke via phone with client. CSW verified client identity. CSW and client spoke of client needs. Client sees Dr. Grace Bushy. Luking as primary care doctor. Client has next appointment with Dr. Wolfgang Phoenix scheduled for 07/18/16.  Client said he is eating well and sleeping well. He did not speak of any current pain issues.  Client said he receives support from his wife and from his son. Client said he had been receiving in home physical therapy sessions as scheduled. He thinks home health physical therapy sessions are helpful to client. CSW spoke with client about client care plan. CSW encouraged client to participate in all scheduled in home physical therapy sessions for client in next 30 days. Client said he uses a walker to ambulate at night.  He said he uses a cane to ambulate when he leaves his home.. Client has had a history of falls.  He reports that he has not had any recent falls.  Client said he has now completed in home occupational therapy. Client said he is doing well now with activities of daily living.  Client said he had his prescribed medications and is taking medications as prescribed.  Client is receiving [facility] nursing support with RN [name].  Client said he has more energy in the mornings.  Client said he likes to watch TV sometimes for relaxation.  Client said his son visits him also and is supportive. Son of client lives near client. Client said he is trying to attend all scheduled appointments with medical providers.  Client said that his spouse, [name], helps transport him to and from his medical appointments.  CSW spoke with Rameen about [facility] program support in social work, nursing and pharmacy areas. CSW thanked client for phone call with CSW on 05/15/16.  CSW encouraged Ryane to call CSW at [phone] as needed to discuss social work needs of client.   Plan:  Client to participate in all scheduled in home physical therapy sessions for client in next 30 days.  CSW  to collaborate with RN [name] in monitoring needs of client.  CSW to call client in 4 weeks to assess client needs.   [name].Latanga Nedrow MSW, LCSW Licensed Clinical Social Worker [facility] Care Management [phone]

## 2016-05-19 ENCOUNTER — Telehealth: Payer: Self-pay | Admitting: Family Medicine

## 2016-05-19 DIAGNOSIS — J449 Chronic obstructive pulmonary disease, unspecified: Secondary | ICD-10-CM | POA: Diagnosis not present

## 2016-05-19 NOTE — Telephone Encounter (Signed)
Patient was released from the [facility] after staying there for 3 weeks.  He was ordered PT and OT.  As far as Kyle [name] knows, Kyle [name] has fallen twice since he has been out and he is concerned because he was told that if he falls on his head again that he might not make it.  He wants to know what Dr. Richardson Landry recommends.

## 2016-05-19 NOTE — Telephone Encounter (Signed)
Usually when disch fr [facility] they stongly rec f u with phys in the community

## 2016-05-19 NOTE — Telephone Encounter (Signed)
Discussed with pt's son. He states he will call back in the morning after speaking with his father.

## 2016-05-19 NOTE — Telephone Encounter (Signed)
Ov tom preferably too complicated to manage over the phone

## 2016-05-26 ENCOUNTER — Ambulatory Visit: Payer: Medicare Other | Admitting: Family Medicine

## 2016-05-27 ENCOUNTER — Other Ambulatory Visit: Payer: Self-pay | Admitting: *Deleted

## 2016-05-27 NOTE — Patient Outreach (Signed)
[facility]) Care Management   05/27/2016  EVERTTE SONES 10-04-1933 [id number]  AYOMIDE PURDY is an 82 y.o. very pleasant 82year old patient who has been followed off and onby [facility] for greater than 3years. He has multiple chronic medical problems including HTN, DMII, CAD, COPD, and CKD. Mr. Cheever fell earlier this month, sustaining a subdural hematoma and required a 3 day hospital stay. He was transitioned to the nursing facility for rehab and recovery prior to discharging to home last week.   Mr. Bhola lives at home with his wife [name] who is his primary caregiver. Mr. Pilley is scheduled for follow up with Dr. Vertell Limber and anticipates a repeat CT of his head. His blood pressure medications were adjusted during his hospitalization and he has been monitoring his BP at home. He isn't sure what his pressures have been as his wife checks them and records (she is unavailable). Mr. Gilbert daily aspirin was discontinued and he is on fall precautions. Mr. Grover is also scheduled to see his ENT in follow up because of a nasal bone fracture sustained at the time of his fall.   I am seeing Mr. Parisi in follow up today to review his progress with fall risk management.   Subjective: "I think I'm doing better. I did have 2 falls but it was because I forgot to do what the therapist told me"  Objective:  BP 140/78   Pulse 68   SpO2 95%   Review of Systems  Constitutional: Negative.   HENT: Negative.   Eyes: Negative.   Respiratory: Negative.   Cardiovascular: Negative.   Gastrointestinal: Negative.   Genitourinary: Negative.   Musculoskeletal: Positive for falls.  Skin: Negative.   Neurological: Negative.   Psychiatric/Behavioral: Negative.     Physical Exam  Constitutional: He is oriented to person, place, and time. Vital signs are normal. He appears well-developed and well-nourished. He is active.  Cardiovascular: Normal rate and regular rhythm.   Respiratory:  Effort normal.  Neurological: He is alert and oriented to person, place, and time.  Skin: Skin is warm, dry and intact.  Psychiatric: He has a normal mood and affect. His speech is normal and behavior is normal. Judgment and thought content normal. Cognition and memory are normal.    Encounter Medications:   Outpatient Encounter Prescriptions as of 05/27/2016  Medication Sig  . albuterol (PROAIR HFA) 108 (90 BASE) MCG/ACT inhaler Inhale 2 puffs into the lungs every 6 (six) hours as needed for wheezing.  Marland Kitchen ALPRAZolam (XANAX) 0.5 MG tablet TAKE ONE TABLET TWICE DAILY AS NEEDED FOR ANXIETY (Patient not taking: Reported on 04/22/2016)  . calcitRIOL (ROCALTROL) 0.25 MCG capsule TAKE ONE CAPSULE ONCE DAILY AT 9:00AM  . cetirizine (ZYRTEC) 10 MG tablet Take 1 tablet (10 mg total) by mouth daily.  . citalopram (CELEXA) 20 MG tablet Take 20 mg by mouth daily.  . fluticasone (FLONASE) 50 MCG/ACT nasal spray Place 1 spray into both nostrils daily.  Marland Kitchen ipratropium (ATROVENT) 0.06 % nasal spray Place 2 sprays into both nostrils 4 (four) times daily. (Patient not taking: Reported on 04/22/2016)  . niacin (NIASPAN) 500 MG CR tablet Take 2 tablets (1,000 mg total) by mouth at bedtime.  . nitroGLYCERIN (NITROSTAT) 0.4 MG SL tablet Place 1 tablet (0.4 mg total) under the tongue every 5 (five) minutes as needed. Call MD if need more than 2  . polyethylene glycol (MIRALAX / GLYCOLAX) packet Take 17 g by mouth daily.  Marland Kitchen  pravastatin (PRAVACHOL) 20 MG tablet Take 20 mg by mouth daily.  . pravastatin (PRAVACHOL) 80 MG tablet TAKE ONE (1) TABLET EACH DAY  . SPIRIVA HANDIHALER 18 MCG inhalation capsule INHALE 1 DOSE BY MOUTH ONCE DAILY  . vitamin B-12 1000 MCG tablet Take 1 tablet (1,000 mcg total) by mouth daily.   Assessment:  82 year old gentleman living in his home in [place] with his very supportive wife. Recently was hospitalized after a fall where he sustained a SDH. Mr. Reuss spent a short time at the skilled  nursing facility for rehab and has discharged to home. Mr. Peasley is working with home health PT and using his walker for ambulation. He has had 2 falls since my last visit and told me "it was because I didn't do what I learned from my therapist". Mr. Chavarin fell once when getting up from a rolling chair and didn't secure his legs against the chair before "takeoff" and a second time when he bent over at the waist rather than squatting to pick up something from the floor board of his car.   We reviewed his HHPT instructions about fall risk reduction today.    Plan:  I will follow up with Mr. Wenrich by phone in a few weeks to ensure that he continues to progress. He and his wife have my contact information and will call for new or worsening symptoms or new concerns.   Upcoming Appointments: Rosana Hoes (Urology, [facility]) 06/09/16 Stem (Renal, [facility])06/30/16 Plonk (Vascular, [facility]) 07/03/16 Luking (primary care) 07/18/16   [facility] Management  [phone]

## 2016-05-28 ENCOUNTER — Encounter (HOSPITAL_COMMUNITY)
Admission: RE | Admit: 2016-05-28 | Discharge: 2016-05-28 | Disposition: A | Discharge status: Home / Self Care | Payer: Medicare Other | Source: Skilled Nursing Facility | Attending: Internal Medicine | Admitting: Internal Medicine

## 2016-05-28 ENCOUNTER — Other Ambulatory Visit (HOSPITAL_COMMUNITY)
Admission: RE | Admit: 2016-05-28 | Discharge: 2016-05-28 | Disposition: A | Discharge status: Home / Self Care | Payer: Medicare Other | Source: Ambulatory Visit | Attending: Internal Medicine | Admitting: Internal Medicine

## 2016-05-28 ENCOUNTER — Encounter (HOSPITAL_COMMUNITY): Payer: Medicare Other | Attending: Hematology & Oncology

## 2016-05-28 DIAGNOSIS — C3491 Malignant neoplasm of unspecified part of right bronchus or lung: Secondary | ICD-10-CM | POA: Diagnosis not present

## 2016-05-28 DIAGNOSIS — N183 Chronic kidney disease, stage 3 (moderate): Secondary | ICD-10-CM | POA: Diagnosis not present

## 2016-05-28 DIAGNOSIS — D649 Anemia, unspecified: Secondary | ICD-10-CM | POA: Insufficient documentation

## 2016-05-28 DIAGNOSIS — N2581 Secondary hyperparathyroidism of renal origin: Secondary | ICD-10-CM | POA: Insufficient documentation

## 2016-05-28 DIAGNOSIS — C679 Malignant neoplasm of bladder, unspecified: Secondary | ICD-10-CM

## 2016-05-28 DIAGNOSIS — E0821 Diabetes mellitus due to underlying condition with diabetic nephropathy: Secondary | ICD-10-CM | POA: Insufficient documentation

## 2016-05-28 DIAGNOSIS — D693 Immune thrombocytopenic purpura: Secondary | ICD-10-CM | POA: Insufficient documentation

## 2016-05-28 DIAGNOSIS — S065X0D Traumatic subdural hemorrhage without loss of consciousness, subsequent encounter: Secondary | ICD-10-CM | POA: Insufficient documentation

## 2016-05-28 DIAGNOSIS — Z9181 History of falling: Secondary | ICD-10-CM | POA: Insufficient documentation

## 2016-05-28 DIAGNOSIS — X58XXXD Exposure to other specified factors, subsequent encounter: Secondary | ICD-10-CM | POA: Insufficient documentation

## 2016-05-28 DIAGNOSIS — Z5189 Encounter for other specified aftercare: Secondary | ICD-10-CM | POA: Insufficient documentation

## 2016-05-28 DIAGNOSIS — I129 Hypertensive chronic kidney disease with stage 1 through stage 4 chronic kidney disease, or unspecified chronic kidney disease: Secondary | ICD-10-CM | POA: Insufficient documentation

## 2016-05-28 LAB — URINALYSIS, ROUTINE W REFLEX MICROSCOPIC
BILIRUBIN URINE: NEGATIVE
Glucose, UA: 250 mg/dL — AB
KETONES UR: NEGATIVE mg/dL
LEUKOCYTES UA: NEGATIVE
NITRITE: NEGATIVE
PH: 6 (ref 5.0–8.0)
PROTEIN: 100 mg/dL — AB
Specific Gravity, Urine: 1.01 (ref 1.005–1.030)

## 2016-05-28 LAB — CBC WITH DIFFERENTIAL/PLATELET
Basophils Absolute: 0 10*3/uL (ref 0.0–0.1)
Basophils Relative: 0 %
EOS PCT: 2 %
Eosinophils Absolute: 0.1 10*3/uL (ref 0.0–0.7)
HEMATOCRIT: 41.7 % (ref 39.0–52.0)
HEMOGLOBIN: 13.8 g/dL (ref 13.0–17.0)
LYMPHS ABS: 1 10*3/uL (ref 0.7–4.0)
LYMPHS PCT: 14 %
MCH: 31.4 pg (ref 26.0–34.0)
MCHC: 33.1 g/dL (ref 30.0–36.0)
MCV: 94.8 fL (ref 78.0–100.0)
Monocytes Absolute: 0.4 10*3/uL (ref 0.1–1.0)
Monocytes Relative: 5 %
NEUTROS ABS: 5.8 10*3/uL (ref 1.7–7.7)
Neutrophils Relative %: 80 %
PLATELETS: 96 10*3/uL — AB (ref 150–400)
RBC: 4.4 MIL/uL (ref 4.22–5.81)
RDW: 16.1 % — ABNORMAL HIGH (ref 11.5–15.5)
WBC: 7.2 10*3/uL (ref 4.0–10.5)

## 2016-05-28 LAB — COMPREHENSIVE METABOLIC PANEL
ALT: 15 U/L — ABNORMAL LOW (ref 17–63)
ANION GAP: 6 (ref 5–15)
AST: 15 U/L (ref 15–41)
Albumin: 4 g/dL (ref 3.5–5.0)
Alkaline Phosphatase: 55 U/L (ref 38–126)
BUN: 29 mg/dL — ABNORMAL HIGH (ref 6–20)
CHLORIDE: 109 mmol/L (ref 101–111)
CO2: 27 mmol/L (ref 22–32)
CREATININE: 2.63 mg/dL — AB (ref 0.61–1.24)
Calcium: 9.4 mg/dL (ref 8.9–10.3)
GFR, EST AFRICAN AMERICAN: 25 mL/min — AB (ref >60)
GFR, EST NON AFRICAN AMERICAN: 22 mL/min — AB (ref >60)
Glucose, Bld: 165 mg/dL — ABNORMAL HIGH (ref 65–99)
POTASSIUM: 3.7 mmol/L (ref 3.5–5.1)
SODIUM: 142 mmol/L (ref 135–145)
Total Bilirubin: 1.1 mg/dL (ref 0.3–1.2)
Total Protein: 7.2 g/dL (ref 6.5–8.1)

## 2016-05-28 LAB — URINE MICROSCOPIC-ADD ON

## 2016-05-28 LAB — IRON AND TIBC
IRON: 45 ug/dL (ref 45–182)
SATURATION RATIOS: 15 % — AB (ref 17.9–39.5)
TIBC: 294 ug/dL (ref 250–450)
UIBC: 249 ug/dL

## 2016-05-28 LAB — URIC ACID: URIC ACID, SERUM: 7.7 mg/dL — AB (ref 4.4–7.6)

## 2016-05-28 LAB — MAGNESIUM: MAGNESIUM: 1.7 mg/dL (ref 1.7–2.4)

## 2016-05-28 LAB — PROTEIN / CREATININE RATIO, URINE
CREATININE, URINE: 72.73 mg/dL
Protein Creatinine Ratio: 2.35 mg/mg{Cre} — ABNORMAL HIGH (ref 0.00–0.15)
TOTAL PROTEIN, URINE: 171 mg/dL

## 2016-05-28 LAB — PHOSPHORUS: PHOSPHORUS: 2.5 mg/dL (ref 2.5–4.6)

## 2016-05-29 ENCOUNTER — Other Ambulatory Visit: Payer: Self-pay | Admitting: Family Medicine

## 2016-05-29 LAB — VITAMIN D 25 HYDROXY (VIT D DEFICIENCY, FRACTURES): VIT D 25 HYDROXY: 23.7 ng/mL — AB (ref 30.0–100.0)

## 2016-05-29 LAB — PARATHYROID HORMONE, INTACT (NO CA): PTH: 68 pg/mL — ABNORMAL HIGH (ref 15–65)

## 2016-05-30 ENCOUNTER — Telehealth: Payer: Self-pay | Admitting: Family Medicine

## 2016-05-30 ENCOUNTER — Telehealth (HOSPITAL_COMMUNITY): Payer: Self-pay | Admitting: Family Medicine

## 2016-05-30 DIAGNOSIS — W19XXXA Unspecified fall, initial encounter: Secondary | ICD-10-CM

## 2016-05-30 NOTE — Telephone Encounter (Signed)
Patient son([name]) would like a referral to [facility] across from [facility] in [place].He states father has fell twice and thinks he needs more rehab.He has already spoke with them but they need referral on patient before they can take him in. And son wants to talk with his dad to see if he is willing to go but wanting this in place just in case.

## 2016-05-30 NOTE — Telephone Encounter (Signed)
05/30/16 son came in to see how to get started with therapy.  I checked on insurance for him and let him know that we would need a referral.  No dedcutible; met 4900 OOP; visits are based on medical necessity and will be covered at 100%; per [facility]  Ref# [id number]  Gave son this information and he was going to see about getting the referral sent to [place].

## 2016-05-30 NOTE — Telephone Encounter (Signed)
Notified son [name]) referral processed in system.

## 2016-05-30 NOTE — Telephone Encounter (Signed)
Let's do 

## 2016-06-02 ENCOUNTER — Other Ambulatory Visit (HOSPITAL_COMMUNITY)
Admission: RE | Admit: 2016-06-02 | Discharge: 2016-06-02 | Disposition: A | Discharge status: Home / Self Care | Payer: Medicare Other | Source: Ambulatory Visit | Attending: Internal Medicine | Admitting: Internal Medicine

## 2016-06-02 ENCOUNTER — Other Ambulatory Visit: Payer: Self-pay | Admitting: Urology

## 2016-06-02 DIAGNOSIS — N183 Chronic kidney disease, stage 3 (moderate): Secondary | ICD-10-CM | POA: Diagnosis not present

## 2016-06-02 DIAGNOSIS — Z8551 Personal history of malignant neoplasm of bladder: Secondary | ICD-10-CM

## 2016-06-02 DIAGNOSIS — G4733 Obstructive sleep apnea (adult) (pediatric): Secondary | ICD-10-CM | POA: Diagnosis not present

## 2016-06-02 LAB — BASIC METABOLIC PANEL
ANION GAP: 4 — AB (ref 5–15)
BUN: 25 mg/dL — ABNORMAL HIGH (ref 6–20)
CALCIUM: 8.7 mg/dL — AB (ref 8.9–10.3)
CO2: 28 mmol/L (ref 22–32)
CREATININE: 2.54 mg/dL — AB (ref 0.61–1.24)
Chloride: 111 mmol/L (ref 101–111)
GFR, EST AFRICAN AMERICAN: 26 mL/min — AB (ref >60)
GFR, EST NON AFRICAN AMERICAN: 22 mL/min — AB (ref >60)
Glucose, Bld: 190 mg/dL — ABNORMAL HIGH (ref 65–99)
Potassium: 3.8 mmol/L (ref 3.5–5.1)
SODIUM: 143 mmol/L (ref 135–145)

## 2016-06-04 ENCOUNTER — Other Ambulatory Visit (HOSPITAL_COMMUNITY): Payer: Self-pay | Admitting: Pharmacist

## 2016-06-04 ENCOUNTER — Ambulatory Visit (HOSPITAL_COMMUNITY): Payer: Medicare Other | Attending: Family Medicine

## 2016-06-04 DIAGNOSIS — R293 Abnormal posture: Secondary | ICD-10-CM | POA: Insufficient documentation

## 2016-06-04 DIAGNOSIS — R296 Repeated falls: Secondary | ICD-10-CM | POA: Insufficient documentation

## 2016-06-04 DIAGNOSIS — M6281 Muscle weakness (generalized): Secondary | ICD-10-CM | POA: Diagnosis not present

## 2016-06-04 NOTE — Therapy (Signed)
[place], [place], [address] Phone: [phone]   Fax:  [phone]  Physical Therapy Evaluation  Patient Details  Name: Kyle [name] MRN: [id number] Date of Birth: 07-19-1934 Referring Provider: Mickie Hillier  Encounter Date: 06/04/2016      PT End of Session - 06/04/16 1237    Visit Number 1   Number of Visits 16   Date for PT Re-Evaluation 07/04/16   Authorization Type UHC- Medicare    Authorization Time Period 06/04/16-08/04/16   Authorization - Visit Number 1   Authorization - Number of Visits 10   PT Start Time 9833   PT Stop Time 1206   PT Time Calculation (min) 50 min   Equipment Utilized During Treatment Gait belt   Activity Tolerance Patient tolerated treatment well;Patient limited by fatigue   Behavior During Therapy [facility] for tasks assessed/performed      Past Medical History:  Diagnosis Date  . AAA (abdominal aortic aneurysm) ([place]) 2004   s/p repair 2004; 4.3 cm infrarenal in 05/2011  . Abnormality of gait 02/23/2013  . Adenocarcinoma of right lung ([place]) 02/24/2011   Ct A/P 2012:  2cm lung mass RLL PET 8250:  Hypermetabolic RLL mass, no other hypermetabolic areas. TTNA 02/2011:  Adenocarcinoma, markers c/w lung origin Right lower lobe superior segmentectomy. 04/01/2011 Dr. Arlyce Dice   . Adenocarcinoma, lung ([place]) 01/2011   transthoracic FNA; resection of the superior segment of the RLL in 03/2011; negative nodes; no chemotherapy nor radiation planned  . Arm fracture    right arm  . Arteriosclerotic cardiovascular disease (ASCVD) 1973, 12/2010   S/P NSTEMI secondary to distal RCA/PL lesion, tx medically.  EF of  55%-60% per  echo.  . Benign prostatic hypertrophy    s/p transurethral resection of the prostate  . Bilateral renal masses    Cystic, more prominent on CT in 12/2010 than 2007; followed by Dr. Rosana Hoes  . Bladder cancer [facility]) 1996   Transurethral resection of the bladder + chemotherapy/BCG as  premed  . Cataract   . Chronic kidney disease    Creatinine 1.4 on discharge 12/20/2100; proteinuria; normal renal ultrasound in 2010; recent creatinines of 1.7-2.; Bilateral cystic renal masses by CT in 2011  . COPD (chronic obstructive pulmonary disease) ([place])   . Coronary artery disease   . Cough    thick phlegm  . Diabetes mellitus    Type II  . Diplopia 02/23/2013  . Diverticulosis   . Essential and other specified forms of tremor 02/23/2013  . Hx of Clostridium difficile infection   . Hyperlipidemia   . Hypertension   . Insomnia   . ITP (idiopathic thrombocytopenic purpura) 09/07/2012   Chronic ITP of adults versus medication-induced ITP.  Stable  . Myocardial infarction   . Nephrolithiasis 2012   ARF in 01/2011 due to obstructing nephrolithiasis  . Obesity   . OSA (obstructive sleep apnea)   . Polyneuropathy in diabetes(357.2) 02/23/2013  . Thrombocytopenia ([place])   . Tobacco abuse    50-pack-year consumption; quit in 12/2010  . Tubular adenoma of colon   . Ventral hernia     Past Surgical History:  Procedure Laterality Date  . ABDOMINAL AORTIC ANEURYSM REPAIR  2004  . CARDIAC CATHETERIZATION    . COLONOSCOPY  03/19/2010   Dr. Gala Romney -(poor prep) Anal papilla, rectal hyperplastic polyp, tubular adenoma removed splenic flexure, left-sided diverticula  . COLONOSCOPY  11/28/2004   RMR:  Diminutive rectal and left colon polyps as  described above, cold  biopsied/removed/  Left sided diverticula. The remainder of the colonic mucosa appeared normal.  . COLONOSCOPY   09/15/01   RMR: Multiple diminutive polyps destroyed with dermolysis as described above/ Multiple small polyps on stalks in the colon resected with snare cautery/ Scattered pan colonic diverticulum/ The remainder of the colonic mucosa appeared normal  . COLONOSCOPY N/A 05/01/2015   Procedure: COLONOSCOPY;  Surgeon: Daneil Dolin, MD;  [facility];  Service: Endoscopy;  Laterality: N/A;  1115  . CYSTECTOMY    .  CYSTOSCOPY  04/2014  . CYSTOSTOMY W/ BLADDER BIOPSY    . FLEXIBLE SIGMOIDOSCOPY  2014   Dr. Olevia Perches: tubular adenoma, negative stool studies   . LUNG LOBECTOMY    . TONSILLECTOMY    . TRANSURETHRAL RESECTION OF PROSTATE    . VIDEO BRONCHOSCOPY WITH ENDOBRONCHIAL NAVIGATION N/A 10/10/2013   Procedure: VIDEO BRONCHOSCOPY WITH ENDOBRONCHIAL NAVIGATION;  Surgeon: Melrose Nakayama, MD;  Location: [facility];  Service: Thoracic;  Laterality: N/A;  NO BLOOD THINNERS BUT PATIENT HAS ITP  . WEDGE RESECTION  04/2011   carcinoma of lung    There were no vitals filed for this visit.       Subjective Assessment - 06/04/16 1124    Subjective Pt reports his balance has been getting worse, and his son wanted him to continue with therpay to improve his safety.    Pertinent History Multiple falls recently, including one last month that resulted in head trauma, hospital admission, and subsequent short term rehab admission. He estimates that he has fallen about 5 times in the last 6 months, twice missing a chair twice, and tumbling forward to pick up a newspaper.   How long can you sit comfortably? N/A   How long can you stand comfortably? Estimated to be about 15 minutes with onset of low back pain.    How long can you walk comfortably? Estimated to be about 15 minutes with onset of low back pain.    Currently in Pain? No/denies            [facility] PT Assessment - 06/04/16 0001      Assessment   Medical Diagnosis Multiple Falls    Referring Provider Mickie Hillier   Onset Date/Surgical Date --  Reports balance issues for about 2-3 years.    Hand Dominance Right   Next MD Visit February 2018   Prior [place] in October 2017 for this problem.      Precautions   Precautions Fall     Restrictions   Weight Bearing Restrictions No     Balance Screen   Has the patient fallen in the past 6 months Yes   How many times? 3   Has the patient had a decrease in activity level because of  a fear of falling?  Yes   Is the patient reluctant to leave their home because of a fear of falling?  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, game shows at night     Observation/Other Assessments   Focus on Therapeutic Outcomes (FOTO)  FOTO: 36 (64% impaired)      Posture/Postural Control   Posture/Postural Control Postural limitations   Postural Limitations Rounded Shoulders;Forward head;Increased thoracic kyphosis;Anterior pelvic tilt     ROM / Strength   AROM / PROM / Strength Strength;AROM     AROM   AROM Assessment Site Ankle   Right Ankle Dorsiflexion 14   Left Ankle Dorsiflexion 10     Strength   Strength Assessment Site Hip;Knee;Ankle   Right/Left Hip Right;Left   Right Hip Flexion 4/5   Right Hip External Rotation  4/5   Right Hip Internal Rotation 4+/5   Right Hip ABduction 4+/5  seated    Right Hip ADduction 4+/5   Left Hip Flexion 4/5   Left Hip External Rotation 4/5   Left Hip Internal Rotation 4+/5   Left Hip ABduction 4+/5  seated   Left Hip ADduction 4+/5   Right/Left Knee Right;Left   Right Knee Flexion 4-/5   Right Knee Extension 5/5   Left Knee Flexion 3+/5   Left Knee Extension 5/5   Right/Left Ankle Left;Right     Bed Mobility   Bed Mobility Rolling Right;Rolling Left;Sit to Supine;Supine to Sit     Transfers   Transfers Sit to Stand   Sit to Stand 5: Supervision;Without upper extremity assist   Comments maximal effort required      Ambulation/Gait   Ambulation/Gait Yes   Ambulation/Gait Assistance 5: Supervision   Ambulation Distance (Feet) 150 Feet   Assistive device --  walking stick   Gait velocity 0.64ms     Balance   Balance Assessed Yes     Standardized Balance Assessment   Standardized Balance Assessment Berg Balance Test     Berg Balance Test   Sit to Stand Able to stand without using hands and stabilize independently   Standing Unsupported Able to stand safely 2 minutes   Sitting with Back Unsupported but Feet Supported on Floor or Stool Able to sit safely and securely 2 minutes   Stand to Sit Sits safely with minimal use of hands   Transfers Able to transfer safely, minor use of hands   Standing Unsupported with Eyes Closed Able to stand 10 seconds safely   Standing Ubsupported with Feet Together Able to place feet together independently and stand for 1 minute with supervision   From Standing, Reach Forward with Outstretched Arm Can reach forward >12 cm safely (5")   From Standing Position, Pick up Object from Floor Able to pick up shoe, needs supervision   From Standing Position, Turn to Look Behind Over each Shoulder Turn sideways only but maintains balance   Turn 360 Degrees Able to turn 360 degrees safely but slowly   Standing Unsupported, Alternately Place Feet on Step/Stool Able to complete >2 steps/needs minimal assist  performs 8 in 24 seconds, with multiple LOB, self corrected   Standing Unsupported, One Foot in Front Able to take small step independently and hold 30 seconds   Standing on One Leg Tries to lift leg/unable to hold 3 seconds but remains standing independently   Total Score 41     Functional Gait  Assessment   Gait assessed  Yes                   [facility] Adult PT Treatment/Exercise - 06/04/16 0001  Exercises   Exercises Knee/Hip     Knee/Hip Exercises: Standing   Other Standing Knee Exercises Tandem Stance, c UE support PRN: 3x30sec  (HEP education)      Knee/Hip Exercises: Seated   Marching Both;1 set;10 reps  (HEP instruction)   Sit to Sand 1 set;10 reps;without UE support  elevated surface (HEP education)      Knee/Hip Exercises: Supine   Bridges 1 set;10 reps;Both  (HEP education)                 PT Education - 06/04/16 1235    Education provided Yes   Education Details HEP education.    Person(s) Educated Patient   Methods Explanation;Demonstration;Tactile cues;Handout   Comprehension Verbalized understanding;Returned demonstration;Verbal cues required;Need further instruction          PT Short Term Goals - 06/04/16 1247      PT SHORT TERM GOAL #1   Title After 4 weeks patient will demonstrate improved balance AEB BBT> 45/56.    Baseline BBT: 41/56   Status New     PT SHORT TERM GOAL #2   Title After 4 weeks pt will demonstrate improved Maximal gait speed AEB 10MWT > 0.77ms.    Baseline 10MWT at eval: 0.6106m   Status New     PT SHORT TERM GOAL #3   Title After 4 weeks patient will demonstrate improved functional strenght AEB ability to perform 5xSTS hands free in <30sec.    Baseline Unable to perform hands free.    Status New           PT Long Term Goals - 06/04/16 1640      PT LONG TERM GOAL #1   Title After 8 weeks patient will demonstrate improved balance AEB BBT> 48/56.    Status New     PT LONG TERM GOAL #2   Title After 8 weeks pt will demonstrate improved maximal gait speed AEB 10MWT > 0.9531m    Status New     PT LONG TERM GOAL #3   Title After 8 weeks patient will demonstrate improved functional strenght AEB ability to perform 5xSTS hands free in <25sec.    Status New               Plan - 06/04/16 1239    Clinical Impression Statement Pt presenting from home with history of balance impairment, now with multiple falls. Pt demonstrating global BLE strength impairment, insufficient to correct LOB, impaired balance AEB BBT: 41/56, and postural abnomalities/core weakness also  contributing to falls. Pt will benefit from skilled PT intervention to adress these impairments, reduce falls risk, improve tolerance to community distance AMB, and improve independence in IADL.    Rehab Potential Good   PT Frequency 2x / week   PT Duration 8 weeks   PT Treatment/Interventions ADLs/Self Care Home Management;Moist Heat;Traction;Therapeutic activities;Therapeutic exercise;Balance training;Neuromuscular re-education;Patient/family education;Gait training;Dry needling;Manual techniques;Passive range of motion   PT Next Visit Plan Review evaluation findings; review treatment goals, Repeat performance of HEP exactly as directed with correction of form PRN. Thoracic towel roll stretch, calf stretch, bed mobility (rolling bilat)    PT Home Exercise Plan Eval: Bridge, tandem stance 3x30sec, STS from elevated surface, seated marching.    Consulted and Agree with Plan of Care Patient      Patient will benefit from skilled therapeutic intervention in order to improve the following deficits and impairments:  Abnormal gait, Decreased coordination, Decreased range of motion, Difficulty walking, Obesity, Pain, Improper body mechanics,  Postural dysfunction, Decreased activity tolerance, Decreased balance, Decreased mobility, Decreased strength, Hypomobility  Visit Diagnosis: Repeated falls - Plan: PT plan of care cert/re-cert  Muscle weakness (generalized) - Plan: PT plan of care cert/re-cert  Abnormal posture - Plan: PT plan of care cert/re-cert      G-Codes - 89/02/28 1644    Functional Assessment Tool Used Clinical Judgment    Functional Limitation Changing and maintaining body position   Changing and Maintaining Body Position Current Status (O0698) At least 60 percent but less than 80 percent impaired, limited or restricted   Changing and Maintaining Body Position Goal Status (Q1483) At least 40 percent but less than 60 percent impaired, limited or restricted       Problem  List Patient Active Problem List   Diagnosis Date Noted  . Subdural hematoma, post-traumatic ([place]) 03/21/2016  . Subdural hematoma ([place]) 03/21/2016  . Cough 10/03/2015  . Allergic rhinitis 10/03/2015  . Tremor, essential 08/27/2015  . History of colonic polyps   . Diverticulosis of colon without hemorrhage   . Essential and other specified forms of tremor 02/23/2013  . Diplopia 02/23/2013  . Diabetic polyneuropathy ([place]) 02/23/2013  . Abnormality of gait 02/23/2013  . COPD exacerbation ([place]) 01/31/2013  . Fracture of right distal radius 12/14/2012  . Diabetes ([place]) 11/30/2012  . ITP (idiopathic thrombocytopenic purpura) 09/07/2012  . LLQ pain 11/13/2011  . Ventral hernia 10/28/2011  . AAA (abdominal aortic aneurysm) ([place])   . Tobacco abuse   . Diarrhea 10/22/2011  . Tubular adenoma of colon   . Obstructive sleep apnea 03/27/2011  . Fasting hyperglycemia 03/27/2011  . Nephrolithiasis 03/27/2011  . Bladder carcinoma ([place]) 03/27/2011  . COPD (chronic obstructive pulmonary disease) ([place]) 02/24/2011  . Adenocarcinoma of right lung ([place]) 02/24/2011  . Arteriosclerotic cardiovascular disease (ASCVD) 01/06/2011  . Hypertension 01/06/2011  . CKD (chronic kidney disease) stage 3, GFR 30-59 ml/min 01/06/2011    4:51 PM, 06/04/16 Etta Grandchild, PT, DPT Physical Therapist at [place] [phone] (office)     [facility] [address] [place], [place], [address] Phone: [phone]   Fax:  [phone]  Name: KASPAR ALBORNOZ MRN: [id number] Date of Birth: August 09, 1933

## 2016-06-05 ENCOUNTER — Ambulatory Visit (HOSPITAL_COMMUNITY): Payer: Medicare Other | Admitting: Physical Therapy

## 2016-06-05 ENCOUNTER — Other Ambulatory Visit: Payer: Medicare Other

## 2016-06-05 ENCOUNTER — Other Ambulatory Visit: Payer: Self-pay | Admitting: Dermatology

## 2016-06-05 DIAGNOSIS — L988 Other specified disorders of the skin and subcutaneous tissue: Secondary | ICD-10-CM | POA: Diagnosis not present

## 2016-06-05 DIAGNOSIS — C44212 Basal cell carcinoma of skin of right ear and external auricular canal: Secondary | ICD-10-CM | POA: Diagnosis not present

## 2016-06-06 ENCOUNTER — Ambulatory Visit (HOSPITAL_COMMUNITY): Payer: Medicare Other | Admitting: Physical Therapy

## 2016-06-06 DIAGNOSIS — R293 Abnormal posture: Secondary | ICD-10-CM

## 2016-06-06 DIAGNOSIS — M6281 Muscle weakness (generalized): Secondary | ICD-10-CM | POA: Diagnosis not present

## 2016-06-06 DIAGNOSIS — R296 Repeated falls: Secondary | ICD-10-CM

## 2016-06-06 NOTE — Therapy (Signed)
[place], [place], [address] Phone: [phone]   Fax:  [phone]  Physical Therapy Treatment  Patient Details  Name: Kyle [name] MRN: [id number] Date of Birth: 1933/10/13 Referring Provider: Mickie Hillier  Encounter Date: 06/06/2016      PT End of Session - 06/06/16 1201    Visit Number 2   Number of Visits 16   Date for PT Re-Evaluation 07/04/16   Authorization Type UHC- Medicare    Authorization Time Period 06/04/16-08/04/16   Authorization - Visit Number 2   Authorization - Number of Visits 10   PT Start Time 1117   PT Stop Time 3295   PT Time Calculation (min) 40 min   Equipment Utilized During Treatment Gait belt   Activity Tolerance Patient tolerated treatment well;Patient limited by fatigue;No increased pain   Behavior During Therapy WFL for tasks assessed/performed      Past Medical History:  Diagnosis Date  . AAA (abdominal aortic aneurysm) ([place]) 2004   s/p repair 2004; 4.3 cm infrarenal in 05/2011  . Abnormality of gait 02/23/2013  . Adenocarcinoma of right lung ([place]) 02/24/2011   Ct A/P 2012:  2cm lung mass RLL PET 1884:  Hypermetabolic RLL mass, no other hypermetabolic areas. TTNA 02/2011:  Adenocarcinoma, markers c/w lung origin Right lower lobe superior segmentectomy. 04/01/2011 Dr. Arlyce Dice   . Adenocarcinoma, lung ([place]) 01/2011   transthoracic FNA; resection of the superior segment of the RLL in 03/2011; negative nodes; no chemotherapy nor radiation planned  . Arm fracture    right arm  . Arteriosclerotic cardiovascular disease (ASCVD) 1973, 12/2010   S/P NSTEMI secondary to distal RCA/PL lesion, tx medically.  EF of  55%-60% per  echo.  . Benign prostatic hypertrophy    s/p transurethral resection of the prostate  . Bilateral renal masses    Cystic, more prominent on CT in 12/2010 than 2007; followed by Dr. Rosana Hoes  . Bladder cancer [facility]) 1996   Transurethral resection of the bladder +  chemotherapy/BCG as premed  . Cataract   . Chronic kidney disease    Creatinine 1.4 on discharge 12/20/2100; proteinuria; normal renal ultrasound in 2010; recent creatinines of 1.7-2.; Bilateral cystic renal masses by CT in 2011  . COPD (chronic obstructive pulmonary disease) ([place])   . Coronary artery disease   . Cough    thick phlegm  . Diabetes mellitus    Type II  . Diplopia 02/23/2013  . Diverticulosis   . Essential and other specified forms of tremor 02/23/2013  . Hx of Clostridium difficile infection   . Hyperlipidemia   . Hypertension   . Insomnia   . ITP (idiopathic thrombocytopenic purpura) 09/07/2012   Chronic ITP of adults versus medication-induced ITP.  Stable  . Myocardial infarction   . Nephrolithiasis 2012   ARF in 01/2011 due to obstructing nephrolithiasis  . Obesity   . OSA (obstructive sleep apnea)   . Polyneuropathy in diabetes(357.2) 02/23/2013  . Thrombocytopenia ([place])   . Tobacco abuse    50-pack-year consumption; quit in 12/2010  . Tubular adenoma of colon   . Ventral hernia     Past Surgical History:  Procedure Laterality Date  . ABDOMINAL AORTIC ANEURYSM REPAIR  2004  . CARDIAC CATHETERIZATION    . COLONOSCOPY  03/19/2010   Dr. Gala Romney -(poor prep) Anal papilla, rectal hyperplastic polyp, tubular adenoma removed splenic flexure, left-sided diverticula  . COLONOSCOPY  11/28/2004   RMR:  Diminutive rectal and left colon  polyps as described above, cold  biopsied/removed/  Left sided diverticula. The remainder of the colonic mucosa appeared normal.  . COLONOSCOPY   09/15/01   RMR: Multiple diminutive polyps destroyed with dermolysis as described above/ Multiple small polyps on stalks in the colon resected with snare cautery/ Scattered pan colonic diverticulum/ The remainder of the colonic mucosa appeared normal  . COLONOSCOPY N/A 05/01/2015   Procedure: COLONOSCOPY;  Surgeon: Daneil Dolin, MD;  [facility];  Service: Endoscopy;  Laterality: N/A;  1115   . CYSTECTOMY    . CYSTOSCOPY  04/2014  . CYSTOSTOMY W/ BLADDER BIOPSY    . FLEXIBLE SIGMOIDOSCOPY  2014   Dr. Olevia Perches: tubular adenoma, negative stool studies   . LUNG LOBECTOMY    . TONSILLECTOMY    . TRANSURETHRAL RESECTION OF PROSTATE    . VIDEO BRONCHOSCOPY WITH ENDOBRONCHIAL NAVIGATION N/A 10/10/2013   Procedure: VIDEO BRONCHOSCOPY WITH ENDOBRONCHIAL NAVIGATION;  Surgeon: Melrose Nakayama, MD;  Location: [place];  Service: Thoracic;  Laterality: N/A;  NO BLOOD THINNERS BUT PATIENT HAS ITP  . WEDGE RESECTION  04/2011   carcinoma of lung    There were no vitals filed for this visit.      Subjective Assessment - 06/06/16 1120    Subjective Pt reports things are going well. No pain at this time. He has been doing his exercises at home with increased difficulty with the balance activities.   Pertinent History Multiple falls recently, including one last month that resulted in head trauma, hospital admission, and subsequent short term rehab admission. He estimates that he has fallen about 5 times in the last 6 months, twice missing a chair twice, and tumbling forward to pick up a newspaper.   How long can you sit comfortably? N/A   How long can you stand comfortably? Estimated to be about 15 minutes with onset of low back pain.    How long can you walk comfortably? Estimated to be about 15 minutes with onset of low back pain.    Currently in Pain? No/denies                         [facility] Adult PT Treatment/Exercise - 06/06/16 0001      Knee/Hip Exercises: Standing   Heel Raises Both;1 set;15 reps   Heel Raises Limitations toe raises   Knee Flexion Both;2 sets;10 reps   Knee Flexion Limitations tactile cues to prevent hip flexion compensation     Knee/Hip Exercises: Seated   Marching Both;2 sets;10 reps   Marching Limitations BUE support on surface    Sit to Sand 5 reps;3 sets  elevated surface      Knee/Hip Exercises: Supine   Bridges Both;2 sets;10 reps              Balance Exercises - 06/06/16 1135      Balance Exercises: Standing   Standing Eyes Opened Narrow base of support (BOS);Other (comment)  x2 reps on foam with NBOS, NBOS on foam with trunk turns L/R   Standing Eyes Closed Narrow base of support (BOS);Foam/compliant surface;2 reps;30 secs   Tandem Stance Eyes open;2 reps;30 secs;Intermittent upper extremity support   Rockerboard Anterior/posterior;Lateral;UE support;Other (comment)  x2 min each            PT Education - 06/06/16 1155    Education provided Yes   Education Details reviewed HEP   Person(s) Educated Patient   Methods Explanation;Tactile cues;Verbal cues   Comprehension  Verbalized understanding;Returned demonstration          PT Short Term Goals - 06/04/16 1247      PT SHORT TERM GOAL #1   Title After 4 weeks patient will demonstrate improved balance AEB BBT> 45/56.    Baseline BBT: 41/56   Status New     PT SHORT TERM GOAL #2   Title After 4 weeks pt will demonstrate improved Maximal gait speed AEB 10MWT > 0.59ms.    Baseline 10MWT at eval: 0.671m   Status New     PT SHORT TERM GOAL #3   Title After 4 weeks patient will demonstrate improved functional strenght AEB ability to perform 5xSTS hands free in <30sec.    Baseline Unable to perform hands free.    Status New           PT Long Term Goals - 06/04/16 1640      PT LONG TERM GOAL #1   Title After 8 weeks patient will demonstrate improved balance AEB BBT> 48/56.    Status New     PT LONG TERM GOAL #2   Title After 8 weeks pt will demonstrate improved maximal gait speed AEB 10MWT > 0.9531m    Status New     PT LONG TERM GOAL #3   Title After 8 weeks patient will demonstrate improved functional strenght AEB ability to perform 5xSTS hands free in <25sec.    Status New               Plan - 06/06/16 1203    Clinical Impression Statement Today's session was spent reviewing pt's HEP to ensure he is performing with proper  technique. Pt becoming SOB (SaO2 down to 93%) during sit to stands and requiring frequent rest breaks throughout the session due to fatigue. Occasionally he would require cues to improve technique with other exercises added for today's session, but overall did very well. Will continue with current POC.   Rehab Potential Good   PT Frequency 2x / week   PT Duration 8 weeks   PT Treatment/Interventions ADLs/Self Care Home Management;Moist Heat;Traction;Therapeutic activities;Therapeutic exercise;Balance training;Neuromuscular re-education;Patient/family education;Gait training;Dry needling;Manual techniques;Passive range of motion   PT Next Visit Plan LE strength progression, Thoracic towel roll stretch, calf stretch, bed mobility (rolling bilat), balance activity   PT Home Exercise Plan Eval: Bridge, tandem stance 3x30sec, STS from elevated surface, seated marching.    Consulted and Agree with Plan of Care Patient      Patient will benefit from skilled therapeutic intervention in order to improve the following deficits and impairments:  Abnormal gait, Decreased coordination, Decreased range of motion, Difficulty walking, Obesity, Pain, Improper body mechanics, Postural dysfunction, Decreased activity tolerance, Decreased balance, Decreased mobility, Decreased strength, Hypomobility  Visit Diagnosis: Repeated falls  Muscle weakness (generalized)  Abnormal posture     Problem List Patient Active Problem List   Diagnosis Date Noted  . Subdural hematoma, post-traumatic (HCC[place]9/07/2015  . Subdural hematoma (HCC[place]9/07/2015  . Cough 10/03/2015  . Allergic rhinitis 10/03/2015  . Tremor, essential 08/27/2015  . History of colonic polyps   . Diverticulosis of colon without hemorrhage   . Essential and other specified forms of tremor 02/23/2013  . Diplopia 02/23/2013  . Diabetic polyneuropathy (HCC[place]8/12/2012  . Abnormality of gait 02/23/2013  . COPD exacerbation (HCCFreedom Acres7/14/2014  .  Fracture of right distal radius 12/14/2012  . Diabetes (HCCRac[place]5/13/2014  . ITP (idiopathic thrombocytopenic purpura) 09/07/2012  . LLQ pain 11/13/2011  . Ventral  hernia 10/28/2011  . AAA (abdominal aortic aneurysm) ([place])   . Tobacco abuse   . Diarrhea 10/22/2011  . Tubular adenoma of colon   . Obstructive sleep apnea 03/27/2011  . Fasting hyperglycemia 03/27/2011  . Nephrolithiasis 03/27/2011  . Bladder carcinoma ([place]) 03/27/2011  . COPD (chronic obstructive pulmonary disease) ([place]) 02/24/2011  . Adenocarcinoma of right lung ([place]) 02/24/2011  . Arteriosclerotic cardiovascular disease (ASCVD) 01/06/2011  . Hypertension 01/06/2011  . CKD (chronic kidney disease) stage 3, GFR 30-59 ml/min 01/06/2011   12:27 PM,06/06/16 Elly Modena PT, DPT [facility] [place] [address] [place], [place], [address] Phone: [phone]   Fax:  [phone]  Name: Kyle [name] MRN: [id number] Date of Birth: 02-24-1934

## 2016-06-09 ENCOUNTER — Other Ambulatory Visit (HOSPITAL_COMMUNITY)
Admission: RE | Admit: 2016-06-09 | Discharge: 2016-06-09 | Disposition: A | Discharge status: Home / Self Care | Payer: Medicare Other | Source: Ambulatory Visit | Attending: Internal Medicine | Admitting: Internal Medicine

## 2016-06-09 ENCOUNTER — Ambulatory Visit
Admission: RE | Admit: 2016-06-09 | Discharge: 2016-06-09 | Disposition: A | Discharge status: Home / Self Care | Payer: Medicare Other | Source: Ambulatory Visit | Attending: Urology | Admitting: Urology

## 2016-06-09 ENCOUNTER — Ambulatory Visit (HOSPITAL_COMMUNITY): Payer: Medicare Other

## 2016-06-09 DIAGNOSIS — N189 Chronic kidney disease, unspecified: Secondary | ICD-10-CM | POA: Diagnosis not present

## 2016-06-09 DIAGNOSIS — M6281 Muscle weakness (generalized): Secondary | ICD-10-CM | POA: Diagnosis not present

## 2016-06-09 DIAGNOSIS — R296 Repeated falls: Secondary | ICD-10-CM

## 2016-06-09 DIAGNOSIS — R293 Abnormal posture: Secondary | ICD-10-CM | POA: Diagnosis not present

## 2016-06-09 DIAGNOSIS — Z8551 Personal history of malignant neoplasm of bladder: Secondary | ICD-10-CM

## 2016-06-09 DIAGNOSIS — C679 Malignant neoplasm of bladder, unspecified: Secondary | ICD-10-CM | POA: Diagnosis not present

## 2016-06-09 LAB — BASIC METABOLIC PANEL
Anion gap: 5 (ref 5–15)
BUN: 27 mg/dL — AB (ref 6–20)
CHLORIDE: 106 mmol/L (ref 101–111)
CO2: 29 mmol/L (ref 22–32)
CREATININE: 2.44 mg/dL — AB (ref 0.61–1.24)
Calcium: 9.7 mg/dL (ref 8.9–10.3)
GFR, EST AFRICAN AMERICAN: 27 mL/min — AB (ref >60)
GFR, EST NON AFRICAN AMERICAN: 24 mL/min — AB (ref >60)
Glucose, Bld: 224 mg/dL — ABNORMAL HIGH (ref 65–99)
POTASSIUM: 4.1 mmol/L (ref 3.5–5.1)
SODIUM: 140 mmol/L (ref 135–145)

## 2016-06-09 NOTE — Therapy (Signed)
[place], [place], [address] Phone: [phone]   Fax:  [phone]  Physical Therapy Treatment  Patient Details  Name: Kyle [name] MRN: [id number] Date of Birth: 1934-05-26 Referring Provider: Mickie Hillier  Encounter Date: 06/09/2016      PT End of Session - 06/09/16 1137    Visit Number 3   Number of Visits 16   Date for PT Re-Evaluation 07/04/16   Authorization Type UHC- Medicare    Authorization Time Period 06/04/16-08/04/16   Authorization - Visit Number 3   Authorization - Number of Visits 10   PT Start Time 1517   PT Stop Time 1156   PT Time Calculation (min) 40 min   Equipment Utilized During Treatment Gait belt   Activity Tolerance Patient tolerated treatment well;Patient limited by fatigue;No increased pain   Behavior During Therapy WFL for tasks assessed/performed      Past Medical History:  Diagnosis Date  . AAA (abdominal aortic aneurysm) ([place]) 2004   s/p repair 2004; 4.3 cm infrarenal in 05/2011  . Abnormality of gait 02/23/2013  . Adenocarcinoma of right lung ([place]) 02/24/2011   Ct A/P 2012:  2cm lung mass RLL PET 6160:  Hypermetabolic RLL mass, no other hypermetabolic areas. TTNA 02/2011:  Adenocarcinoma, markers c/w lung origin Right lower lobe superior segmentectomy. 04/01/2011 Dr. Arlyce Dice   . Adenocarcinoma, lung ([place]) 01/2011   transthoracic FNA; resection of the superior segment of the RLL in 03/2011; negative nodes; no chemotherapy nor radiation planned  . Arm fracture    right arm  . Arteriosclerotic cardiovascular disease (ASCVD) 1973, 12/2010   S/P NSTEMI secondary to distal RCA/PL lesion, tx medically.  EF of  55%-60% per  echo.  . Benign prostatic hypertrophy    s/p transurethral resection of the prostate  . Bilateral renal masses    Cystic, more prominent on CT in 12/2010 than 2007; followed by Dr. Rosana Hoes  . Bladder cancer [facility]) 1996   Transurethral resection of the bladder +  chemotherapy/BCG as premed  . Cataract   . Chronic kidney disease    Creatinine 1.4 on discharge 12/20/2100; proteinuria; normal renal ultrasound in 2010; recent creatinines of 1.7-2.; Bilateral cystic renal masses by CT in 2011  . COPD (chronic obstructive pulmonary disease) ([place])   . Coronary artery disease   . Cough    thick phlegm  . Diabetes mellitus    Type II  . Diplopia 02/23/2013  . Diverticulosis   . Essential and other specified forms of tremor 02/23/2013  . Hx of Clostridium difficile infection   . Hyperlipidemia   . Hypertension   . Insomnia   . ITP (idiopathic thrombocytopenic purpura) 09/07/2012   Chronic ITP of adults versus medication-induced ITP.  Stable  . Myocardial infarction   . Nephrolithiasis 2012   ARF in 01/2011 due to obstructing nephrolithiasis  . Obesity   . OSA (obstructive sleep apnea)   . Polyneuropathy in diabetes(357.2) 02/23/2013  . Thrombocytopenia ([place])   . Tobacco abuse    50-pack-year consumption; quit in 12/2010  . Tubular adenoma of colon   . Ventral hernia     Past Surgical History:  Procedure Laterality Date  . ABDOMINAL AORTIC ANEURYSM REPAIR  2004  . CARDIAC CATHETERIZATION    . COLONOSCOPY  03/19/2010   Dr. Gala Romney -(poor prep) Anal papilla, rectal hyperplastic polyp, tubular adenoma removed splenic flexure, left-sided diverticula  . COLONOSCOPY  11/28/2004   RMR:  Diminutive rectal and left colon  polyps as described above, cold  biopsied/removed/  Left sided diverticula. The remainder of the colonic mucosa appeared normal.  . COLONOSCOPY   09/15/01   RMR: Multiple diminutive polyps destroyed with dermolysis as described above/ Multiple small polyps on stalks in the colon resected with snare cautery/ Scattered pan colonic diverticulum/ The remainder of the colonic mucosa appeared normal  . COLONOSCOPY N/A 05/01/2015   Procedure: COLONOSCOPY;  Surgeon: Daneil Dolin, MD;  [facility];  Service: Endoscopy;  Laterality: N/A;  1115   . CYSTECTOMY    . CYSTOSCOPY  04/2014  . CYSTOSTOMY W/ BLADDER BIOPSY    . FLEXIBLE SIGMOIDOSCOPY  2014   Dr. Olevia Perches: tubular adenoma, negative stool studies   . LUNG LOBECTOMY    . TONSILLECTOMY    . TRANSURETHRAL RESECTION OF PROSTATE    . VIDEO BRONCHOSCOPY WITH ENDOBRONCHIAL NAVIGATION N/A 10/10/2013   Procedure: VIDEO BRONCHOSCOPY WITH ENDOBRONCHIAL NAVIGATION;  Surgeon: Melrose Nakayama, MD;  Location: [place];  Service: Thoracic;  Laterality: N/A;  NO BLOOD THINNERS BUT PATIENT HAS ITP  . WEDGE RESECTION  04/2011   carcinoma of lung    There were no vitals filed for this visit.      Subjective Assessment - 06/09/16 1120    Subjective Pt doign well today, a bit of a runny nose with soem sinus issues. He says exercises are going well, able to complete all as directed.    Pertinent History Multiple falls recently, including one last month that resulted in head trauma, hospital admission, and subsequent short term rehab admission. He estimates that he has fallen about 5 times in the last 6 months, twice missing a chair twice, and tumbling forward to pick up a newspaper.   Currently in Pain? No/denies                         [facility] Adult PT Treatment/Exercise - 06/09/16 0001      Therapeutic Activites    Therapeutic Activities Other Therapeutic Activities   Other Therapeutic Activities HIIT: overground max velocity gait 4x90'  1.77ms c SPC, supervision level     Knee/Hip Exercises: Stretches   GPress photographerBoth;2 reps;30 seconds  *forefoot only on slant board     Knee/Hip Exercises: Standing   Heel Raises Both;15 reps;2 sets  narrow stance   Other Standing Knee Exercises Tandem Stance, c UE support PRN: 3x30sec  heel to toe, 2" step width, arms across chest   Other Standing Knee Exercises Narrow Stance on Foam: 3x60sec, arms across chest  Anterior step taps, 9": 2x10, alt bilat; VC for lightest tap     Knee/Hip Exercises: Seated   Marching Both;2  sets;10 reps;Strengthening   Marching Limitations from 25" height surface   Sit to Sand 2 sets;10 reps;without UE support  elevated surface (25")              Balance Exercises - 06/09/16 1156      Balance Exercises: Standing   Wall Bumps Shoulder  narrow firm, ball over door frame, 20x             PT Short Term Goals - 06/09/16 1142      PT SHORT TERM GOAL #1   Title After 4 weeks patient will demonstrate improved balance AEB BBT> 45/56.    Baseline BBT: 41/56   Status On-going     PT SHORT TERM GOAL #2   Title After 4 weeks pt will demonstrate improved  Maximal gait speed AEB 10MWT > 1.58ms.    Baseline 10MWT at eval: 0.683m; 1.0567mon 11/20    Status Revised     PT SHORT TERM GOAL #3   Title After 4 weeks patient will demonstrate improved functional strenght AEB ability to perform 5xSTS hands free in <30sec.    Status On-going           PT Long Term Goals - 06/09/16 1143      PT LONG TERM GOAL #1   Title After 8 weeks patient will demonstrate improved balance AEB BBT> 48/56.    Status On-going     PT LONG TERM GOAL #2   Title After 8 weeks pt will demonstrate improved maximal gait speed AEB 10MWT > 1.35m87m   Status On-going     PT LONG TERM GOAL #3   Title After 8 weeks patient will demonstrate improved functional strenght AEB ability to perform 5xSTS hands free in <25sec.    Status On-going               Plan - 06/09/16 1138    Clinical Impression Statement Session going well today, able to continue with current program of strengthening and balalnce. Pt able to perform with minimal fatigue. Progress is made toward all goals. Max gait speed goal is increased.    Rehab Potential Good   PT Frequency 2x / week   PT Duration 8 weeks   PT Treatment/Interventions ADLs/Self Care Home Management;Moist Heat;Traction;Therapeutic activities;Therapeutic exercise;Balance training;Neuromuscular re-education;Patient/family education;Gait training;Dry  needling;Manual techniques;Passive range of motion   PT Next Visit Plan LE strength progression, continue with balance progression, calf stretch, bed mobility (rolling bilat), balance activity   PT Home Exercise Plan Eval: Bridge, tandem stance 3x30sec, STS from elevated surface, seated marching.    Consulted and Agree with Plan of Care Patient      Patient will benefit from skilled therapeutic intervention in order to improve the following deficits and impairments:  Abnormal gait, Decreased coordination, Decreased range of motion, Difficulty walking, Obesity, Pain, Improper body mechanics, Postural dysfunction, Decreased activity tolerance, Decreased balance, Decreased mobility, Decreased strength, Hypomobility  Visit Diagnosis: Repeated falls  Muscle weakness (generalized)  Abnormal posture     Problem List Patient Active Problem List   Diagnosis Date Noted  . Subdural hematoma, post-traumatic (HCC)[place]/07/2015  . Subdural hematoma (HCC)[place]/07/2015  . Cough 10/03/2015  . Allergic rhinitis 10/03/2015  . Tremor, essential 08/27/2015  . History of colonic polyps   . Diverticulosis of colon without hemorrhage   . Essential and other specified forms of tremor 02/23/2013  . Diplopia 02/23/2013  . Diabetic polyneuropathy (HCC)[place]/12/2012  . Abnormality of gait 02/23/2013  . COPD exacerbation (HCC)[place]/14/2014  . Fracture of right distal radius 12/14/2012  . Diabetes (HCC)[place]/13/2014  . ITP (idiopathic thrombocytopenic purpura) 09/07/2012  . LLQ pain 11/13/2011  . Ventral hernia 10/28/2011  . AAA (abdominal aortic aneurysm) (HCC)[place]. Tobacco abuse   . Diarrhea 10/22/2011  . Tubular adenoma of colon   . Obstructive sleep apnea 03/27/2011  . Fasting hyperglycemia 03/27/2011  . Nephrolithiasis 03/27/2011  . Bladder carcinoma (HCC)[place]/12/2010  . COPD (chronic obstructive pulmonary disease) (HCC)[place]/12/2010  . Adenocarcinoma of right lung (HCC)[place]/12/2010  . Arteriosclerotic  cardiovascular disease (ASCVD) 01/06/2011  . Hypertension 01/06/2011  . CKD (chronic kidney disease) stage 3, GFR 30-59 ml/min 01/06/2011    11:57 AM, 06/09/16 AllaEtta Grandchild, DPT Physical Therapist at [facility] [phone] (office)     [place] [address] [place], [place], [address] Phone: [phone]   Fax:  [phone]  Name: NICHOALS HEYDE MRN: [id number] Date of Birth: 09-Oct-1933

## 2016-06-11 ENCOUNTER — Ambulatory Visit (HOSPITAL_COMMUNITY): Payer: Medicare Other

## 2016-06-11 DIAGNOSIS — M6281 Muscle weakness (generalized): Secondary | ICD-10-CM

## 2016-06-11 DIAGNOSIS — R293 Abnormal posture: Secondary | ICD-10-CM

## 2016-06-11 DIAGNOSIS — R296 Repeated falls: Secondary | ICD-10-CM

## 2016-06-11 NOTE — Therapy (Signed)
[place], [place], [address] Phone: [phone]   Fax:  [phone]  Physical Therapy Treatment  Patient Details  Name: Kyle [name] MRN: [id number] Date of Birth: 1934-01-25 Referring Provider: Mickie Hillier  Encounter Date: 06/11/2016      PT End of Session - 06/11/16 1240    Visit Number 4   Number of Visits 16   Date for PT Re-Evaluation 07/04/16   Authorization Type UHC- Medicare    Authorization Time Period 06/04/16-08/04/16   Authorization - Visit Number 4   Authorization - Number of Visits 10   PT Start Time 1120   PT Stop Time 1200   PT Time Calculation (min) 40 min   Equipment Utilized During Treatment Gait belt   Activity Tolerance Patient tolerated treatment well;Patient limited by fatigue;No increased pain   Behavior During Therapy WFL for tasks assessed/performed      Past Medical History:  Diagnosis Date  . AAA (abdominal aortic aneurysm) ([place]) 2004   s/p repair 2004; 4.3 cm infrarenal in 05/2011  . Abnormality of gait 02/23/2013  . Adenocarcinoma of right lung ([name]) 02/24/2011   Ct A/P 2012:  2cm lung mass RLL PET 7408:  Hypermetabolic RLL mass, no other hypermetabolic areas. TTNA 02/2011:  Adenocarcinoma, markers c/w lung origin Right lower lobe superior segmentectomy. 04/01/2011 Dr. Arlyce Dice   . Adenocarcinoma, lung ([place]) 01/2011   transthoracic FNA; resection of the superior segment of the RLL in 03/2011; negative nodes; no chemotherapy nor radiation planned  . Arm fracture    right arm  . Arteriosclerotic cardiovascular disease (ASCVD) 1973, 12/2010   S/P NSTEMI secondary to distal RCA/PL lesion, tx medically.  EF of  55%-60% per  echo.  . Benign prostatic hypertrophy    s/p transurethral resection of the prostate  . Bilateral renal masses    Cystic, more prominent on CT in 12/2010 than 2007; followed by Dr. Rosana Hoes  . Bladder cancer [facility] 1996   Transurethral resection of the bladder +  chemotherapy/BCG as premed  . Cataract   . Chronic kidney disease    Creatinine 1.4 on discharge 12/20/2100; proteinuria; normal renal ultrasound in 2010; recent creatinines of 1.7-2.; Bilateral cystic renal masses by CT in 2011  . COPD (chronic obstructive pulmonary disease) ([place])   . Coronary artery disease   . Cough    thick phlegm  . Diabetes mellitus    Type II  . Diplopia 02/23/2013  . Diverticulosis   . Essential and other specified forms of tremor 02/23/2013  . Hx of Clostridium difficile infection   . Hyperlipidemia   . Hypertension   . Insomnia   . ITP (idiopathic thrombocytopenic purpura) 09/07/2012   Chronic ITP of adults versus medication-induced ITP.  Stable  . Myocardial infarction   . Nephrolithiasis 2012   ARF in 01/2011 due to obstructing nephrolithiasis  . Obesity   . OSA (obstructive sleep apnea)   . Polyneuropathy in diabetes(357.2) 02/23/2013  . Thrombocytopenia ([place])   . Tobacco abuse    50-pack-year consumption; quit in 12/2010  . Tubular adenoma of colon   . Ventral hernia     Past Surgical History:  Procedure Laterality Date  . ABDOMINAL AORTIC ANEURYSM REPAIR  2004  . CARDIAC CATHETERIZATION    . COLONOSCOPY  03/19/2010   Dr. Gala Romney -(poor prep) Anal papilla, rectal hyperplastic polyp, tubular adenoma removed splenic flexure, left-sided diverticula  . COLONOSCOPY  11/28/2004   RMR:  Diminutive rectal and left colon  polyps as described above, cold  biopsied/removed/  Left sided diverticula. The remainder of the colonic mucosa appeared normal.  . COLONOSCOPY   09/15/01   RMR: Multiple diminutive polyps destroyed with dermolysis as described above/ Multiple small polyps on stalks in the colon resected with snare cautery/ Scattered pan colonic diverticulum/ The remainder of the colonic mucosa appeared normal  . COLONOSCOPY N/A 05/01/2015   Procedure: COLONOSCOPY;  Surgeon: Daneil Dolin, MD;  [facility];  Service: Endoscopy;  Laterality: N/A;  1115   . CYSTECTOMY    . CYSTOSCOPY  04/2014  . CYSTOSTOMY W/ BLADDER BIOPSY    . FLEXIBLE SIGMOIDOSCOPY  2014   Dr. Olevia Perches: tubular adenoma, negative stool studies   . LUNG LOBECTOMY    . TONSILLECTOMY    . TRANSURETHRAL RESECTION OF PROSTATE    . VIDEO BRONCHOSCOPY WITH ENDOBRONCHIAL NAVIGATION N/A 10/10/2013   Procedure: VIDEO BRONCHOSCOPY WITH ENDOBRONCHIAL NAVIGATION;  Surgeon: Melrose Nakayama, MD;  Location: [place];  Service: Thoracic;  Laterality: N/A;  NO BLOOD THINNERS BUT PATIENT HAS ITP  . WEDGE RESECTION  04/2011   carcinoma of lung    There were no vitals filed for this visit.                       [place] Adult PT Treatment/Exercise - 06/11/16 0001      Ambulation/Gait   Ambulation/Gait Yes   Ambulation/Gait Assistance 5: Supervision   Ambulation Distance (Feet) --  4 sets with 226-550 feet perset     Posture/Postural Control   Posture/Postural Control Postural limitations   Postural Limitations Rounded Shoulders;Forward head;Increased thoracic kyphosis;Anterior pelvic tilt     Therapeutic Activites    Therapeutic Activities Other Therapeutic Activities   Other Therapeutic Activities HIIT: overground max velocity gait 4x90'     Knee/Hip Exercises: Stretches   Gastroc Stretch 2 reps;30 seconds   Gastroc Stretch Limitations slant board     Knee/Hip Exercises: Standing   Heel Raises Both;15 reps;2 sets  NBOS   Other Standing Knee Exercises Tandem Stance, c UE support PRN: 3x30sec   Other Standing Knee Exercises Narrow Stance on Foam: 3x60sec, arms across chest     Knee/Hip Exercises: Seated   Sit to Sand 10 reps;without UE support  cueing for eccentric control             Balance Exercises - 06/11/16 1211      Balance Exercises: Standing   Standing Eyes Opened Narrow base of support (BOS);Foam/compliant surface   Tandem Stance Eyes open;3 reps;30 secs;Intermittent upper extremity support   Cone Rotation Foam/compliant surface;Right  turn;Left turn   Marching Limitations Toe tapping 5" holds with intermitternt HHA on 8 in step; high marching with 5" holds with intermittent HHA 15x             PT Short Term Goals - 06/09/16 1142      PT SHORT TERM GOAL #1   Title After 4 weeks patient will demonstrate improved balance AEB BBT> 45/56.    Baseline BBT: 41/56   Status On-going     PT SHORT TERM GOAL #2   Title After 4 weeks pt will demonstrate improved Maximal gait speed AEB 10MWT > 1.43ms.    Baseline 10MWT at eval: 0.624m; 1.0534mon 11/20    Status Revised     PT SHORT TERM GOAL #3   Title After 4 weeks patient will demonstrate improved functional strenght AEB ability to perform 5xSTS hands free  in <30sec.    Status On-going           PT Long Term Goals - 06/09/16 1143      PT LONG TERM GOAL #1   Title After 8 weeks patient will demonstrate improved balance AEB BBT> 48/56.    Status On-going     PT LONG TERM GOAL #2   Title After 8 weeks pt will demonstrate improved maximal gait speed AEB 10MWT > 1.81ms.    Status On-going     PT LONG TERM GOAL #3   Title After 8 weeks patient will demonstrate improved functional strenght AEB ability to perform 5xSTS hands free in <25sec.    Status On-going               Plan - 06/11/16 1240    Clinical Impression Statement Continued session foucs on functional strengthening and balance training incorporating HIIT with vitals assessed through session.  Pt able to complete full session with no reports of pain and minimal fatigue, noted increased gait speed with each set of gait training this session (with SBA using SPC).  Min A with balance actiivities for safety, added cone rotation on dynamic surface to improve rotation for core activation/strengthening and reaching outside BOS.     Rehab Potential Good   PT Frequency 2x / week   PT Duration 8 weeks   PT Treatment/Interventions ADLs/Self Care Home Management;Moist Heat;Traction;Therapeutic  activities;Therapeutic exercise;Balance training;Neuromuscular re-education;Patient/family education;Gait training;Dry needling;Manual techniques;Passive range of motion   PT Next Visit Plan LE strength progression, continue with balance progression, calf stretch, bed mobility (rolling bilat), balance activity   PT Home Exercise Plan Eval: Bridge, tandem stance 3x30sec, STS from elevated surface, seated marching.       Patient will benefit from skilled therapeutic intervention in order to improve the following deficits and impairments:  Abnormal gait, Decreased coordination, Decreased range of motion, Difficulty walking, Obesity, Pain, Improper body mechanics, Postural dysfunction, Decreased activity tolerance, Decreased balance, Decreased mobility, Decreased strength, Hypomobility  Visit Diagnosis: Repeated falls  Muscle weakness (generalized)  Abnormal posture     Problem List Patient Active Problem List   Diagnosis Date Noted  . Subdural hematoma, post-traumatic ([place] 03/21/2016  . Subdural hematoma ([place] 03/21/2016  . Cough 10/03/2015  . Allergic rhinitis 10/03/2015  . Tremor, essential 08/27/2015  . History of colonic polyps   . Diverticulosis of colon without hemorrhage   . Essential and other specified forms of tremor 02/23/2013  . Diplopia 02/23/2013  . Diabetic polyneuropathy ([place] 02/23/2013  . Abnormality of gait 02/23/2013  . COPD exacerbation ([place] 01/31/2013  . Fracture of right distal radius 12/14/2012  . Diabetes ([place] 11/30/2012  . ITP (idiopathic thrombocytopenic purpura) 09/07/2012  . LLQ pain 11/13/2011  . Ventral hernia 10/28/2011  . AAA (abdominal aortic aneurysm) ([place]   . Tobacco abuse   . Diarrhea 10/22/2011  . Tubular adenoma of colon   . Obstructive sleep apnea 03/27/2011  . Fasting hyperglycemia 03/27/2011  . Nephrolithiasis 03/27/2011  . Bladder carcinoma ([place] 03/27/2011  . COPD (chronic obstructive pulmonary disease) ([facility] 02/24/2011  .  Adenocarcinoma of right lung ([place] 02/24/2011  . Arteriosclerotic cardiovascular disease (ASCVD) 01/06/2011  . Hypertension 01/06/2011  . CKD (chronic kidney disease) stage 3, GFR 30-59 ml/min 01/06/2011   CIhor Austin LPTA; CBIS [phone] CAldona Lento11/22/2017, 12:48 PM  [place] [place] [address]Phone: [phone]  Fax:  [phone] Name: RPius Byrom  Mckesson MRN: [id number] Date of Birth: 1933-08-09

## 2016-06-16 ENCOUNTER — Telehealth: Payer: Self-pay | Admitting: Family Medicine

## 2016-06-16 ENCOUNTER — Ambulatory Visit (HOSPITAL_COMMUNITY): Payer: Medicare Other

## 2016-06-16 ENCOUNTER — Encounter (HOSPITAL_COMMUNITY)
Admission: RE | Admit: 2016-06-16 | Discharge: 2016-06-16 | Disposition: A | Discharge status: Home / Self Care | Payer: Medicare Other | Source: Ambulatory Visit | Attending: Internal Medicine | Admitting: Internal Medicine

## 2016-06-16 DIAGNOSIS — R293 Abnormal posture: Secondary | ICD-10-CM

## 2016-06-16 DIAGNOSIS — Z9181 History of falling: Secondary | ICD-10-CM | POA: Diagnosis not present

## 2016-06-16 DIAGNOSIS — M6281 Muscle weakness (generalized): Secondary | ICD-10-CM | POA: Diagnosis not present

## 2016-06-16 DIAGNOSIS — X58XXXD Exposure to other specified factors, subsequent encounter: Secondary | ICD-10-CM | POA: Diagnosis not present

## 2016-06-16 DIAGNOSIS — R296 Repeated falls: Secondary | ICD-10-CM

## 2016-06-16 DIAGNOSIS — E0821 Diabetes mellitus due to underlying condition with diabetic nephropathy: Secondary | ICD-10-CM | POA: Diagnosis not present

## 2016-06-16 DIAGNOSIS — S065X0D Traumatic subdural hemorrhage without loss of consciousness, subsequent encounter: Secondary | ICD-10-CM | POA: Diagnosis not present

## 2016-06-16 DIAGNOSIS — Z5189 Encounter for other specified aftercare: Secondary | ICD-10-CM | POA: Diagnosis not present

## 2016-06-16 DIAGNOSIS — D693 Immune thrombocytopenic purpura: Secondary | ICD-10-CM | POA: Diagnosis not present

## 2016-06-16 LAB — BASIC METABOLIC PANEL
ANION GAP: 7 (ref 5–15)
BUN: 25 mg/dL — ABNORMAL HIGH (ref 6–20)
CALCIUM: 9.1 mg/dL (ref 8.9–10.3)
CHLORIDE: 108 mmol/L (ref 101–111)
CO2: 26 mmol/L (ref 22–32)
Creatinine, Ser: 2.51 mg/dL — ABNORMAL HIGH (ref 0.61–1.24)
GFR calc non Af Amer: 23 mL/min — ABNORMAL LOW (ref >60)
GFR, EST AFRICAN AMERICAN: 26 mL/min — AB (ref >60)
Glucose, Bld: 208 mg/dL — ABNORMAL HIGH (ref 65–99)
Potassium: 4.5 mmol/L (ref 3.5–5.1)
Sodium: 141 mmol/L (ref 135–145)

## 2016-06-16 NOTE — Telephone Encounter (Signed)
Patient had a visit at Outpatient therapy and his son, [name], was given a paper which showed his blood pressure readings from the visit.  They took his blood pressure 4 times which read 156/86, 152/86, 148/92, 151/95.  His heart rate readings were 83, 96, 90, 103. [name] is concerned because he has issues with his kidneys and he was doing his research and found that it is recommended for patients with this particular health condition to have a blood pressure below 140.  He wants to know what Dr. Richardson Landry recommends.

## 2016-06-16 NOTE — Therapy (Signed)
[place], [place], [address] Phone: [phone]   Fax:  [phone]  Physical Therapy Treatment  Patient Details  Name: Kyle [name] MRN: [id number] Date of Birth: 28-Nov-1933 Referring Provider: Mickie Hillier  Encounter Date: 06/16/2016      PT End of Session - 06/16/16 1134    Visit Number 5   Number of Visits 16   Date for PT Re-Evaluation 07/04/16   Authorization Type UHC- Medicare    Authorization Time Period 06/04/16-08/04/16   Authorization - Visit Number 5   Authorization - Number of Visits 10   PT Start Time 7416   PT Stop Time 1200   PT Time Calculation (min) 41 min   Activity Tolerance Patient tolerated treatment well;Patient limited by fatigue;No increased pain   Behavior During Therapy WFL for tasks assessed/performed      Past Medical History:  Diagnosis Date  . AAA (abdominal aortic aneurysm) ([place]) 2004   s/p repair 2004; 4.3 cm infrarenal in 05/2011  . Abnormality of gait 02/23/2013  . Adenocarcinoma of right lung ([place]) 02/24/2011   Ct A/P 2012:  2cm lung mass RLL PET 3845:  Hypermetabolic RLL mass, no other hypermetabolic areas. TTNA 02/2011:  Adenocarcinoma, markers c/w lung origin Right lower lobe superior segmentectomy. 04/01/2011 Dr. Arlyce Dice   . Adenocarcinoma, lung ([place]) 01/2011   transthoracic FNA; resection of the superior segment of the RLL in 03/2011; negative nodes; no chemotherapy nor radiation planned  . Arm fracture    right arm  . Arteriosclerotic cardiovascular disease (ASCVD) 1973, 12/2010   S/P NSTEMI secondary to distal RCA/PL lesion, tx medically.  EF of  55%-60% per  echo.  . Benign prostatic hypertrophy    s/p transurethral resection of the prostate  . Bilateral renal masses    Cystic, more prominent on CT in 12/2010 than 2007; followed by Dr. Rosana Hoes  . Bladder cancer [facility]) 1996   Transurethral resection of the bladder + chemotherapy/BCG as premed  . Cataract   . Chronic  kidney disease    Creatinine 1.4 on discharge 12/20/2100; proteinuria; normal renal ultrasound in 2010; recent creatinines of 1.7-2.; Bilateral cystic renal masses by CT in 2011  . COPD (chronic obstructive pulmonary disease) ([place])   . Coronary artery disease   . Cough    thick phlegm  . Diabetes mellitus    Type II  . Diplopia 02/23/2013  . Diverticulosis   . Essential and other specified forms of tremor 02/23/2013  . Hx of Clostridium difficile infection   . Hyperlipidemia   . Hypertension   . Insomnia   . ITP (idiopathic thrombocytopenic purpura) 09/07/2012   Chronic ITP of adults versus medication-induced ITP.  Stable  . Myocardial infarction   . Nephrolithiasis 2012   ARF in 01/2011 due to obstructing nephrolithiasis  . Obesity   . OSA (obstructive sleep apnea)   . Polyneuropathy in diabetes(357.2) 02/23/2013  . Thrombocytopenia ([place])   . Tobacco abuse    50-pack-year consumption; quit in 12/2010  . Tubular adenoma of colon   . Ventral hernia     Past Surgical History:  Procedure Laterality Date  . ABDOMINAL AORTIC ANEURYSM REPAIR  2004  . CARDIAC CATHETERIZATION    . COLONOSCOPY  03/19/2010   Dr. Gala Romney -(poor prep) Anal papilla, rectal hyperplastic polyp, tubular adenoma removed splenic flexure, left-sided diverticula  . COLONOSCOPY  11/28/2004   RMR:  Diminutive rectal and left colon polyps as described above, cold  biopsied/removed/  Left sided diverticula. The remainder of the colonic mucosa appeared normal.  . COLONOSCOPY   09/15/01   RMR: Multiple diminutive polyps destroyed with dermolysis as described above/ Multiple small polyps on stalks in the colon resected with snare cautery/ Scattered pan colonic diverticulum/ The remainder of the colonic mucosa appeared normal  . COLONOSCOPY N/A 05/01/2015   Procedure: COLONOSCOPY;  Surgeon: Daneil Dolin, MD;  [facility];  Service: Endoscopy;  Laterality: N/A;  1115  . CYSTECTOMY    . CYSTOSCOPY  04/2014  .  CYSTOSTOMY W/ BLADDER BIOPSY    . FLEXIBLE SIGMOIDOSCOPY  2014   Dr. Olevia Perches: tubular adenoma, negative stool studies   . LUNG LOBECTOMY    . TONSILLECTOMY    . TRANSURETHRAL RESECTION OF PROSTATE    . VIDEO BRONCHOSCOPY WITH ENDOBRONCHIAL NAVIGATION N/A 10/10/2013   Procedure: VIDEO BRONCHOSCOPY WITH ENDOBRONCHIAL NAVIGATION;  Surgeon: Melrose Nakayama, MD;  Location: [place];  Service: Thoracic;  Laterality: N/A;  NO BLOOD THINNERS BUT PATIENT HAS ITP  . WEDGE RESECTION  04/2011   carcinoma of lung    There were no vitals filed for this visit.      Subjective Assessment - 06/16/16 1124    Subjective Pt is doing well. He had a restful Thanksgiving holiday. Pt was in [place] with family. Exercises are goign well at home.    Pertinent History Multiple falls recently, including one last month that resulted in head trauma, hospital admission, and subsequent short term rehab admission. He estimates that he has fallen about 5 times in the last 6 months, twice missing a chair twice, and tumbling forward to pick up a newspaper.   Currently in Pain? No/denies                         [facility] Adult PT Treatment/Exercise - 06/16/16 0001      Ambulation/Gait   Pre-Gait Activities lateral sidestepping: 2x35' bilat, tactile cues for pelvis alignment   retroAMB: 2x35': ( minA for falls recovery x3)      Therapeutic Activites    Therapeutic Activities Other Therapeutic Activities   Other Therapeutic Activities HIIT: overground max velocity gait 4x90'  1.17ms c SPC, supervision level (1.05 on 11/20)     Knee/Hip Exercises: Stretches   Other Knee/Hip Stretches Sagittal Rockerboard for 2': ankle AAROM warmup  2x30sec slant board stretch      Knee/Hip Exercises: Standing   Heel Raises Both;15 reps;2 sets  NBOS   Other Standing Knee Exercises Tandem Stance, c UE support PRN: 3x30sec  heel to toe, 2" step width, arms across chest   Other Standing Knee Exercises Narrow Stance on  Foam: 2x60sec, arms at chest  Anterior step cone taps, 9": 2x15, alt B; BUE support, 7" co     Knee/Hip Exercises: Seated   Sit to Sand 10 reps;without UE support;1 set  Chair + dynadisc                  PT Short Term Goals - 06/09/16 1142      PT SHORT TERM GOAL #1   Title After 4 weeks patient will demonstrate improved balance AEB BBT> 45/56.    Baseline BBT: 41/56   Status On-going     PT SHORT TERM GOAL #2   Title After 4 weeks pt will demonstrate improved Maximal gait speed AEB 10MWT > 1.12m.    Baseline 10MWT at eval: 0.6896m 1.53m81mn 11/20    Status Revised  PT SHORT TERM GOAL #3   Title After 4 weeks patient will demonstrate improved functional strenght AEB ability to perform 5xSTS hands free in <30sec.    Status On-going           PT Long Term Goals - 06/09/16 1143      PT LONG TERM GOAL #1   Title After 8 weeks patient will demonstrate improved balance AEB BBT> 48/56.    Status On-going     PT LONG TERM GOAL #2   Title After 8 weeks pt will demonstrate improved maximal gait speed AEB 10MWT > 1.71ms.    Status On-going     PT LONG TERM GOAL #3   Title After 8 weeks patient will demonstrate improved functional strenght AEB ability to perform 5xSTS hands free in <25sec.    Status On-going               Plan - 06/16/16 1137    Clinical Impression Statement Pt tolerating session well today, no increase in pain, but some limitations due to easy onset fatigue. Balance and function strength remain heavily emphasized this session, with some progress in exercises noted. Progress toward goals going well overall.  No chnags to POC at this time.  STS ios progressed to a lower elevated surface. Gait speed during HIIT is increased compared to 2 sessions ago. Pt demonstrates substantial fatgiue with advanced gait training, needing additional seated rest breaks, as well as multiple LOB at end, showing effect fatgiue has on dynmaic stability.    Rehab  Potential Good   PT Frequency 2x / week   PT Duration 8 weeks   PT Treatment/Interventions ADLs/Self Care Home Management;Moist Heat;Traction;Therapeutic activities;Therapeutic exercise;Balance training;Neuromuscular re-education;Patient/family education;Gait training;Dry needling;Manual techniques;Passive range of motion   PT Next Visit Plan LE strength progression, continue with balance progression, calf stretch, bed mobility (rolling bilat), balance activity   PT Home Exercise Plan Eval: Bridge, tandem stance 3x30sec, STS from elevated surface, seated marching.    Consulted and Agree with Plan of Care Patient      Patient will benefit from skilled therapeutic intervention in order to improve the following deficits and impairments:  Abnormal gait, Decreased coordination, Decreased range of motion, Difficulty walking, Obesity, Pain, Improper body mechanics, Postural dysfunction, Decreased activity tolerance, Decreased balance, Decreased mobility, Decreased strength, Hypomobility  Visit Diagnosis: Repeated falls  Muscle weakness (generalized)  Abnormal posture     Problem List Patient Active Problem List   Diagnosis Date Noted  . Subdural hematoma, post-traumatic ([facility] 03/21/2016  . Subdural hematoma ([facility] 03/21/2016  . Cough 10/03/2015  . Allergic rhinitis 10/03/2015  . Tremor, essential 08/27/2015  . History of colonic polyps   . Diverticulosis of colon without hemorrhage   . Essential and other specified forms of tremor 02/23/2013  . Diplopia 02/23/2013  . Diabetic polyneuropathy ([facility] 02/23/2013  . Abnormality of gait 02/23/2013  . COPD exacerbation ([place] 01/31/2013  . Fracture of right distal radius 12/14/2012  . Diabetes ([facility] 11/30/2012  . ITP (idiopathic thrombocytopenic purpura) 09/07/2012  . LLQ pain 11/13/2011  . Ventral hernia 10/28/2011  . AAA (abdominal aortic aneurysm) ([place]   . Tobacco abuse   . Diarrhea 10/22/2011  . Tubular adenoma of colon   .  Obstructive sleep apnea 03/27/2011  . Fasting hyperglycemia 03/27/2011  . Nephrolithiasis 03/27/2011  . Bladder carcinoma ([facility] 03/27/2011  . COPD (chronic obstructive pulmonary disease) ([place] 02/24/2011  . Adenocarcinoma of right lung ([place] 02/24/2011  . Arteriosclerotic cardiovascular disease (ASCVD) 01/06/2011  .  Hypertension 01/06/2011  . CKD (chronic kidney disease) stage 3, GFR 30-59 ml/min 01/06/2011    12:06 PM, 06/16/16 Etta Grandchild, PT, DPT Physical Therapist at [facility] [phone] (office)     [place], [place], [address] Phone: [phone]   Fax:  [phone]  Name: Kyle [name] MRN: [id number] Date of Birth: 1933/08/21

## 2016-06-17 ENCOUNTER — Other Ambulatory Visit: Payer: Self-pay | Admitting: *Deleted

## 2016-06-17 MED ORDER — CITALOPRAM HYDROBROMIDE 20 MG PO TABS: 20.0000 mg | ORAL_TABLET | Freq: Every day | ORAL | 0 refills | Status: DC

## 2016-06-17 MED ORDER — METOPROLOL TARTRATE 50 MG PO TABS: ORAL_TABLET | ORAL | 5 refills | Status: DC

## 2016-06-17 NOTE — Telephone Encounter (Signed)
Left message return call 06/17/16

## 2016-06-17 NOTE — Telephone Encounter (Signed)
Spoke with patient and informed him per Dr.Steve Luking- recommend  resuming metoprolol at a lower dose 25 mg bid (that would be one half a 50 mg tab bid) numb 30 five ref, will see blood pressure  at follow up visit in about one month. Patient verbalized understanding.

## 2016-06-17 NOTE — Telephone Encounter (Signed)
Reviewed chart, rec resuming metoprolol at a lower dose 25 mg bid (that would be one half a 50 mg tab bid) numb 30 five ref, will see bp at f u visit in about one mo

## 2016-06-18 ENCOUNTER — Other Ambulatory Visit: Payer: Self-pay | Admitting: Licensed Clinical Social Worker

## 2016-06-18 ENCOUNTER — Ambulatory Visit (HOSPITAL_COMMUNITY): Payer: Medicare Other

## 2016-06-18 DIAGNOSIS — R293 Abnormal posture: Secondary | ICD-10-CM | POA: Diagnosis not present

## 2016-06-18 DIAGNOSIS — R296 Repeated falls: Secondary | ICD-10-CM

## 2016-06-18 DIAGNOSIS — M6281 Muscle weakness (generalized): Secondary | ICD-10-CM

## 2016-06-18 NOTE — Patient Outreach (Signed)
Assessment:  CSW spoke via phone with client. CSW verified client identity. CSW and client spoke of clent needs. Client sees Dr. Grace Bushy. Luking as primary care doctor.  Client has appointment scheduled with Dr. Wolfgang Phoenix for 07/18/16. Client said he is eating well and sleeping well. Client uses a walker to ambulate at home. He also uses a cane to ambulate when he goes into the community.  He has support from his spouse and from his son. Client said he has had a history of falls. He said he has fallen 2 times recently but did not have any serious injury from these two recent falls. . He has completed home health physical therapy sessions.  He said he is now receiving Outpatient Physical Therapy sessions weekly as scheduled in [place], [place].   Client has prescribed medications and is taking medications as prescribed. Client receives [place] support from [employer]. Client has completed in home occupational therapy sessions.  Client reports that Outpatient Physical Therapy sessions he is receiving are very helpful to him. Client said that [name], his spouse, transports client to and from client's scheduled medical appointments. CSW and client spoke of client care plan. CSW encouraged client to particapte in all scheduled client Outpatient Physical Therapy sessions for client in next 30 days.  CSW encouraged client to call RN [name] for [place] support for client. CSW encouraged client to call CSW at [phone] as needed for social work support for client. Client was appreciative of call from [place] on 06/18/16.   Plan:  Client to participate in all scheduled client Outpatient Physical Therapy sessions for client in next 30 days.  CSW to call client in 4 weeks to assess client needs.   CSW to collaborate with RN [name] in monitoring needs of client.  [name].Jermain Curt MSW, LCSW Licensed Clinical Social Worker [facility] Management [phone]

## 2016-06-18 NOTE — Therapy (Signed)
[place], [place], [address] Phone: [phone]   Fax:  [phone]  Physical Therapy Treatment  Patient Details  Name: Kyle [name] MRN: [id number] Date of Birth: 1934-03-24 Referring Provider: Mickie Hillier  Encounter Date: 06/18/2016      PT End of Session - 06/18/16 1125    Visit Number 6   Number of Visits 16   Date for PT Re-Evaluation 07/04/16   Authorization Type UHC- Medicare    Authorization Time Period 06/04/16-08/04/16   Authorization - Visit Number 6   Authorization - Number of Visits 10   PT Start Time 1120   PT Stop Time 1203   PT Time Calculation (min) 43 min   Equipment Utilized During Treatment Gait belt   Activity Tolerance Patient tolerated treatment well;Patient limited by fatigue;No increased pain   Behavior During Therapy WFL for tasks assessed/performed      Past Medical History:  Diagnosis Date  . AAA (abdominal aortic aneurysm) ([place]) 2004   s/p repair 2004; 4.3 cm infrarenal in 05/2011  . Abnormality of gait 02/23/2013  . Adenocarcinoma of right lung ([place]) 02/24/2011   Ct A/P 2012:  2cm lung mass RLL PET 2423:  Hypermetabolic RLL mass, no other hypermetabolic areas. TTNA 02/2011:  Adenocarcinoma, markers c/w lung origin Right lower lobe superior segmentectomy. 04/01/2011 Dr. Arlyce Dice   . Adenocarcinoma, lung ([place]) 01/2011   transthoracic FNA; resection of the superior segment of the RLL in 03/2011; negative nodes; no chemotherapy nor radiation planned  . Arm fracture    right arm  . Arteriosclerotic cardiovascular disease (ASCVD) 1973, 12/2010   S/P NSTEMI secondary to distal RCA/PL lesion, tx medically.  EF of  55%-60% per  echo.  . Benign prostatic hypertrophy    s/p transurethral resection of the prostate  . Bilateral renal masses    Cystic, more prominent on CT in 12/2010 than 2007; followed by Dr. Rosana Hoes  . Bladder cancer [facility]) 1996   Transurethral resection of the bladder +  chemotherapy/BCG as premed  . Cataract   . Chronic kidney disease    Creatinine 1.4 on discharge 12/20/2100; proteinuria; normal renal ultrasound in 2010; recent creatinines of 1.7-2.; Bilateral cystic renal masses by CT in 2011  . COPD (chronic obstructive pulmonary disease) ([place])   . Coronary artery disease   . Cough    thick phlegm  . Diabetes mellitus    Type II  . Diplopia 02/23/2013  . Diverticulosis   . Essential and other specified forms of tremor 02/23/2013  . Hx of Clostridium difficile infection   . Hyperlipidemia   . Hypertension   . Insomnia   . ITP (idiopathic thrombocytopenic purpura) 09/07/2012   Chronic ITP of adults versus medication-induced ITP.  Stable  . Myocardial infarction   . Nephrolithiasis 2012   ARF in 01/2011 due to obstructing nephrolithiasis  . Obesity   . OSA (obstructive sleep apnea)   . Polyneuropathy in diabetes(357.2) 02/23/2013  . Thrombocytopenia ([place])   . Tobacco abuse    50-pack-year consumption; quit in 12/2010  . Tubular adenoma of colon   . Ventral hernia     Past Surgical History:  Procedure Laterality Date  . ABDOMINAL AORTIC ANEURYSM REPAIR  2004  . CARDIAC CATHETERIZATION    . COLONOSCOPY  03/19/2010   Dr. Gala Romney -(poor prep) Anal papilla, rectal hyperplastic polyp, tubular adenoma removed splenic flexure, left-sided diverticula  . COLONOSCOPY  11/28/2004   RMR:  Diminutive rectal and left colon  polyps as described above, cold  biopsied/removed/  Left sided diverticula. The remainder of the colonic mucosa appeared normal.  . COLONOSCOPY   09/15/01   RMR: Multiple diminutive polyps destroyed with dermolysis as described above/ Multiple small polyps on stalks in the colon resected with snare cautery/ Scattered pan colonic diverticulum/ The remainder of the colonic mucosa appeared normal  . COLONOSCOPY N/A 05/01/2015   Procedure: COLONOSCOPY;  Surgeon: Daneil Dolin, MD;  [facility];  Service: Endoscopy;  Laterality: N/A;  1115   . CYSTECTOMY    . CYSTOSCOPY  04/2014  . CYSTOSTOMY W/ BLADDER BIOPSY    . FLEXIBLE SIGMOIDOSCOPY  2014   Dr. Olevia Perches: tubular adenoma, negative stool studies   . LUNG LOBECTOMY    . TONSILLECTOMY    . TRANSURETHRAL RESECTION OF PROSTATE    . VIDEO BRONCHOSCOPY WITH ENDOBRONCHIAL NAVIGATION N/A 10/10/2013   Procedure: VIDEO BRONCHOSCOPY WITH ENDOBRONCHIAL NAVIGATION;  Surgeon: Melrose Nakayama, MD;  Location: [facility];  Service: Thoracic;  Laterality: N/A;  NO BLOOD THINNERS BUT PATIENT HAS ITP  . WEDGE RESECTION  04/2011   carcinoma of lung    There were no vitals filed for this visit.                       [facility] Adult PT Treatment/Exercise - 06/18/16 0001      Ambulation/Gait   Ambulation/Gait Yes   Ambulation/Gait Assistance 5: Supervision   Gait Comments HIIT: 3x 255f with vitals assessed.       Therapeutic Activites    Therapeutic Activities Other Therapeutic Activities     Knee/Hip Exercises: Standing   Heel Raises Both;15 reps;2 sets   Heel Raises Limitations toe raises             Balance Exercises - 06/18/16 1200      Balance Exercises: Standing   Standing Eyes Closed Narrow base of support (BOS);Foam/compliant surface;2 reps;30 secs   Tandem Stance Eyes open;3 reps;30 secs;Intermittent upper extremity support   Rockerboard Anterior/posterior;Lateral;UE support  2 mib each   Retro Gait 3 reps  inside // bars no HHA   Sidestepping 4 reps;Theraband  RTB inside // bars no HHA   Marching Limitations Toe tapping 5" holds with intermitternt HHA on 8 in step; high marching with 5" holds with intermittent HHA 15x             PT Short Term Goals - 06/09/16 1142      PT SHORT TERM GOAL #1   Title After 4 weeks patient will demonstrate improved balance AEB BBT> 45/56.    Baseline BBT: 41/56   Status On-going     PT SHORT TERM GOAL #2   Title After 4 weeks pt will demonstrate improved Maximal gait speed AEB 10MWT > 1.167m.     Baseline 10MWT at eval: 0.6854m 1.74m39mn 11/20    Status Revised     PT SHORT TERM GOAL #3   Title After 4 weeks patient will demonstrate improved functional strenght AEB ability to perform 5xSTS hands free in <30sec.    Status On-going           PT Long Term Goals - 06/09/16 1143      PT LONG TERM GOAL #1   Title After 8 weeks patient will demonstrate improved balance AEB BBT> 48/56.    Status On-going     PT LONG TERM GOAL #2   Title After 8 weeks pt will demonstrate improved  maximal gait speed AEB 10MWT > 1.29ms.    Status On-going     PT LONG TERM GOAL #3   Title After 8 weeks patient will demonstrate improved functional strenght AEB ability to perform 5xSTS hands free in <25sec.    Status On-going               Plan - 06/18/16 1252    Clinical Impression Statement Pt stated he has been on his feet alot this morning and c/o increased soreness BLE initial this session, no reports of pain more fatigue.  Session foucs on improving balance and functional strengthening.  Pt making gains towards functional strenghtening with ability to complete sit to stand from lower surface with no HHA or manual A required.  Added pregait balance activities with min A required to reduce risk of falls.     Rehab Potential Good   PT Frequency 2x / week   PT Duration 8 weeks   PT Treatment/Interventions ADLs/Self Care Home Management;Moist Heat;Traction;Therapeutic activities;Therapeutic exercise;Balance training;Neuromuscular re-education;Patient/family education;Gait training;Dry needling;Manual techniques;Passive range of motion   PT Next Visit Plan LE strength progression, continue with balance progression, calf stretch, bed mobility (rolling bilat), balance activity      Patient will benefit from skilled therapeutic intervention in order to improve the following deficits and impairments:  Abnormal gait, Decreased coordination, Decreased range of motion, Difficulty walking, Obesity,  Pain, Improper body mechanics, Postural dysfunction, Decreased activity tolerance, Decreased balance, Decreased mobility, Decreased strength, Hypomobility  Visit Diagnosis: Repeated falls  Muscle weakness (generalized)  Abnormal posture     Problem List Patient Active Problem List   Diagnosis Date Noted  . Subdural hematoma, post-traumatic ([place] 03/21/2016  . Subdural hematoma ([place] 03/21/2016  . Cough 10/03/2015  . Allergic rhinitis 10/03/2015  . Tremor, essential 08/27/2015  . History of colonic polyps   . Diverticulosis of colon without hemorrhage   . Essential and other specified forms of tremor 02/23/2013  . Diplopia 02/23/2013  . Diabetic polyneuropathy ([place] 02/23/2013  . Abnormality of gait 02/23/2013  . COPD exacerbation ([place] 01/31/2013  . Fracture of right distal radius 12/14/2012  . Diabetes ([place] 11/30/2012  . ITP (idiopathic thrombocytopenic purpura) 09/07/2012  . LLQ pain 11/13/2011  . Ventral hernia 10/28/2011  . AAA (abdominal aortic aneurysm) ([place]   . Tobacco abuse   . Diarrhea 10/22/2011  . Tubular adenoma of colon   . Obstructive sleep apnea 03/27/2011  . Fasting hyperglycemia 03/27/2011  . Nephrolithiasis 03/27/2011  . Bladder carcinoma ([place] 03/27/2011  . COPD (chronic obstructive pulmonary disease) ([place] 02/24/2011  . Adenocarcinoma of right lung ([place] 02/24/2011  . Arteriosclerotic cardiovascular disease (ASCVD) 01/06/2011  . Hypertension 01/06/2011  . CKD (chronic kidney disease) stage 3, GFR 30-59 ml/min 01/06/2011   CIhor Austin LPTA; CBIS [phone] CAldona Lento11/29/2017, 1:01 PM  [place] [address]Phone: [phone]  Fax:  [phone] Name: Kyle GEFFREMRN: [id number] of Birth: 110-Mar-1935

## 2016-06-19 DIAGNOSIS — J449 Chronic obstructive pulmonary disease, unspecified: Secondary | ICD-10-CM | POA: Diagnosis not present

## 2016-06-20 ENCOUNTER — Other Ambulatory Visit: Payer: Self-pay | Admitting: *Deleted

## 2016-06-20 MED ORDER — ALPRAZOLAM 0.5 MG PO TABS: ORAL_TABLET | ORAL | 5 refills | Status: DC

## 2016-06-23 ENCOUNTER — Ambulatory Visit (HOSPITAL_COMMUNITY): Payer: Medicare Other | Attending: Family Medicine | Admitting: Physical Therapy

## 2016-06-23 ENCOUNTER — Other Ambulatory Visit (HOSPITAL_COMMUNITY)
Admission: RE | Admit: 2016-06-23 | Discharge: 2016-06-23 | Disposition: A | Discharge status: Home / Self Care | Payer: Medicare Other | Source: Ambulatory Visit | Attending: Internal Medicine | Admitting: Internal Medicine

## 2016-06-23 DIAGNOSIS — R296 Repeated falls: Secondary | ICD-10-CM | POA: Diagnosis not present

## 2016-06-23 DIAGNOSIS — N184 Chronic kidney disease, stage 4 (severe): Secondary | ICD-10-CM | POA: Diagnosis not present

## 2016-06-23 DIAGNOSIS — R293 Abnormal posture: Secondary | ICD-10-CM | POA: Insufficient documentation

## 2016-06-23 DIAGNOSIS — M6281 Muscle weakness (generalized): Secondary | ICD-10-CM | POA: Diagnosis not present

## 2016-06-23 DIAGNOSIS — I129 Hypertensive chronic kidney disease with stage 1 through stage 4 chronic kidney disease, or unspecified chronic kidney disease: Secondary | ICD-10-CM | POA: Diagnosis not present

## 2016-06-23 LAB — URINALYSIS W MICROSCOPIC (NOT AT ARMC)
BILIRUBIN URINE: NEGATIVE
GLUCOSE, UA: 100 mg/dL — AB
KETONES UR: NEGATIVE mg/dL
LEUKOCYTES UA: NEGATIVE
NITRITE: NEGATIVE
PH: 5.5 (ref 5.0–8.0)
Protein, ur: 100 mg/dL — AB
SPECIFIC GRAVITY, URINE: 1.02 (ref 1.005–1.030)
Squamous Epithelial / LPF: NONE SEEN

## 2016-06-23 LAB — PROTEIN / CREATININE RATIO, URINE
CREATININE, URINE: 80.67 mg/dL
PROTEIN CREATININE RATIO: 1.8 mg/mg{creat} — AB (ref 0.00–0.15)
TOTAL PROTEIN, URINE: 145 mg/dL

## 2016-06-23 LAB — BASIC METABOLIC PANEL
ANION GAP: 6 (ref 5–15)
BUN: 39 mg/dL — ABNORMAL HIGH (ref 6–20)
CHLORIDE: 110 mmol/L (ref 101–111)
CO2: 27 mmol/L (ref 22–32)
Calcium: 9.5 mg/dL (ref 8.9–10.3)
Creatinine, Ser: 2.59 mg/dL — ABNORMAL HIGH (ref 0.61–1.24)
GFR calc non Af Amer: 22 mL/min — ABNORMAL LOW (ref >60)
GFR, EST AFRICAN AMERICAN: 25 mL/min — AB (ref >60)
Glucose, Bld: 135 mg/dL — ABNORMAL HIGH (ref 65–99)
POTASSIUM: 4.5 mmol/L (ref 3.5–5.1)
SODIUM: 143 mmol/L (ref 135–145)

## 2016-06-23 NOTE — Therapy (Signed)
[place], [place], [address] Phone: [phone]   Fax:  [phone]  Physical Therapy Treatment  Patient Details  Name: Kyle [name] MRN: [id number] Date of Birth: 1933/09/24 Referring Provider: Mickie Hillier  Encounter Date: 06/23/2016      PT End of Session - 06/23/16 0902    Visit Number 7   Number of Visits 16   Date for PT Re-Evaluation 07/04/16   Authorization Type UHC- Medicare    Authorization Time Period 06/04/16-08/04/16   Authorization - Visit Number 7   Authorization - Number of Visits 10   PT Start Time 0818   PT Stop Time 0902   PT Time Calculation (min) 44 min   Equipment Utilized During Treatment Gait belt   Activity Tolerance Patient tolerated treatment well;Patient limited by fatigue;No increased pain   Behavior During Therapy WFL for tasks assessed/performed      Past Medical History:  Diagnosis Date  . AAA (abdominal aortic aneurysm) ([place]) 2004   s/p repair 2004; 4.3 cm infrarenal in 05/2011  . Abnormality of gait 02/23/2013  . Adenocarcinoma of right lung ([place]) 02/24/2011   Ct A/P 2012:  2cm lung mass RLL PET 7591:  Hypermetabolic RLL mass, no other hypermetabolic areas. TTNA 02/2011:  Adenocarcinoma, markers c/w lung origin Right lower lobe superior segmentectomy. 04/01/2011 Dr. Arlyce Dice   . Adenocarcinoma, lung ([place]) 01/2011   transthoracic FNA; resection of the superior segment of the RLL in 03/2011; negative nodes; no chemotherapy nor radiation planned  . Arm fracture    right arm  . Arteriosclerotic cardiovascular disease (ASCVD) 1973, 12/2010   S/P NSTEMI secondary to distal RCA/PL lesion, tx medically.  EF of  55%-60% per  echo.  . Benign prostatic hypertrophy    s/p transurethral resection of the prostate  . Bilateral renal masses    Cystic, more prominent on CT in 12/2010 than 2007; followed by Dr. Rosana Hoes  . Bladder cancer [facility]) 1996   Transurethral resection of the bladder +  chemotherapy/BCG as premed  . Cataract   . Chronic kidney disease    Creatinine 1.4 on discharge 12/20/2100; proteinuria; normal renal ultrasound in 2010; recent creatinines of 1.7-2.; Bilateral cystic renal masses by CT in 2011  . COPD (chronic obstructive pulmonary disease) ([place])   . Coronary artery disease   . Cough    thick phlegm  . Diabetes mellitus    Type II  . Diplopia 02/23/2013  . Diverticulosis   . Essential and other specified forms of tremor 02/23/2013  . Hx of Clostridium difficile infection   . Hyperlipidemia   . Hypertension   . Insomnia   . ITP (idiopathic thrombocytopenic purpura) 09/07/2012   Chronic ITP of adults versus medication-induced ITP.  Stable  . Myocardial infarction   . Nephrolithiasis 2012   ARF in 01/2011 due to obstructing nephrolithiasis  . Obesity   . OSA (obstructive sleep apnea)   . Polyneuropathy in diabetes(357.2) 02/23/2013  . Thrombocytopenia ([place])   . Tobacco abuse    50-pack-year consumption; quit in 12/2010  . Tubular adenoma of colon   . Ventral hernia     Past Surgical History:  Procedure Laterality Date  . ABDOMINAL AORTIC ANEURYSM REPAIR  2004  . CARDIAC CATHETERIZATION    . COLONOSCOPY  03/19/2010   Dr. Gala Romney -(poor prep) Anal papilla, rectal hyperplastic polyp, tubular adenoma removed splenic flexure, left-sided diverticula  . COLONOSCOPY  11/28/2004   RMR:  Diminutive rectal and left colon  polyps as described above, cold  biopsied/removed/  Left sided diverticula. The remainder of the colonic mucosa appeared normal.  . COLONOSCOPY   09/15/01   RMR: Multiple diminutive polyps destroyed with dermolysis as described above/ Multiple small polyps on stalks in the colon resected with snare cautery/ Scattered pan colonic diverticulum/ The remainder of the colonic mucosa appeared normal  . COLONOSCOPY N/A 05/01/2015   Procedure: COLONOSCOPY;  Surgeon: Daneil Dolin, MD;  [facility];  Service: Endoscopy;  Laterality: N/A;  1115   . CYSTECTOMY    . CYSTOSCOPY  04/2014  . CYSTOSTOMY W/ BLADDER BIOPSY    . FLEXIBLE SIGMOIDOSCOPY  2014   Dr. Olevia Perches: tubular adenoma, negative stool studies   . LUNG LOBECTOMY    . TONSILLECTOMY    . TRANSURETHRAL RESECTION OF PROSTATE    . VIDEO BRONCHOSCOPY WITH ENDOBRONCHIAL NAVIGATION N/A 10/10/2013   Procedure: VIDEO BRONCHOSCOPY WITH ENDOBRONCHIAL NAVIGATION;  Surgeon: Melrose Nakayama, MD;  Location: [facility];  Service: Thoracic;  Laterality: N/A;  NO BLOOD THINNERS BUT PATIENT HAS ITP  . WEDGE RESECTION  04/2011   carcinoma of lung    There were no vitals filed for this visit.                       [facility] Adult PT Treatment/Exercise - 06/23/16 0001      Knee/Hip Exercises: Standing   Heel Raises Both;15 reps;2 sets   Heel Raises Limitations toe raises             Balance Exercises - 06/23/16 0844      Balance Exercises: Standing   Standing Eyes Closed Narrow base of support (BOS);Foam/compliant surface;2 reps;30 secs   Tandem Stance Eyes open;3 reps;30 secs;Intermittent upper extremity support   Rockerboard Anterior/posterior;Lateral;UE support   Sidestepping 2 reps  blue line no AD   Marching Limitations Toe tapping 5" holds with intermitternt HHA on 8 in step; high marching with 5" holds with intermittent HHA 15x             PT Short Term Goals - 06/09/16 1142      PT SHORT TERM GOAL #1   Title After 4 weeks patient will demonstrate improved balance AEB BBT> 45/56.    Baseline BBT: 41/56   Status On-going     PT SHORT TERM GOAL #2   Title After 4 weeks pt will demonstrate improved Maximal gait speed AEB 10MWT > 1.59ms.    Baseline 10MWT at eval: 0.668m; 1.0570mon 11/20    Status Revised     PT SHORT TERM GOAL #3   Title After 4 weeks patient will demonstrate improved functional strenght AEB ability to perform 5xSTS hands free in <30sec.    Status On-going           PT Long Term Goals - 06/09/16 1143      PT LONG  TERM GOAL #1   Title After 8 weeks patient will demonstrate improved balance AEB BBT> 48/56.    Status On-going     PT LONG TERM GOAL #2   Title After 8 weeks pt will demonstrate improved maximal gait speed AEB 10MWT > 1.49m72m   Status On-going     PT LONG TERM GOAL #3   Title After 8 weeks patient will demonstrate improved functional strenght AEB ability to perform 5xSTS hands free in <25sec.    Status On-going  Plan - 06/23/16 1001    Clinical Impression Statement Pt with reports of slight dizziness today due to sinus issues.  Monitored BP with start of 161/79 mmHg.  After activity 165/91 mmHg.  States he continues to have fatigue but denies need to return to MD.  Pt required 3 short seated rest breaks during session today.  Progress to side stepping on line this session with cues for posture and larger steps.     Rehab Potential Good   PT Frequency 2x / week   PT Duration 8 weeks   PT Treatment/Interventions ADLs/Self Care Home Management;Moist Heat;Traction;Therapeutic activities;Therapeutic exercise;Balance training;Neuromuscular re-education;Patient/family education;Gait training;Dry needling;Manual techniques;Passive range of motion   PT Next Visit Plan LE strength progression, continue with balance progression, calf stretch, bed mobility (rolling bilat), balance activity      Patient will benefit from skilled therapeutic intervention in order to improve the following deficits and impairments:  Abnormal gait, Decreased coordination, Decreased range of motion, Difficulty walking, Obesity, Pain, Improper body mechanics, Postural dysfunction, Decreased activity tolerance, Decreased balance, Decreased mobility, Decreased strength, Hypomobility  Visit Diagnosis: Repeated falls  Muscle weakness (generalized)  Abnormal posture     Problem List Patient Active Problem List   Diagnosis Date Noted  . Subdural hematoma, post-traumatic ([place]) 03/21/2016  .  Subdural hematoma ([place]) 03/21/2016  . Cough 10/03/2015  . Allergic rhinitis 10/03/2015  . Tremor, essential 08/27/2015  . History of colonic polyps   . Diverticulosis of colon without hemorrhage   . Essential and other specified forms of tremor 02/23/2013  . Diplopia 02/23/2013  . Diabetic polyneuropathy ([place]) 02/23/2013  . Abnormality of gait 02/23/2013  . COPD exacerbation ([place]) 01/31/2013  . Fracture of right distal radius 12/14/2012  . Diabetes ([place]) 11/30/2012  . ITP (idiopathic thrombocytopenic purpura) 09/07/2012  . LLQ pain 11/13/2011  . Ventral hernia 10/28/2011  . AAA (abdominal aortic aneurysm) ([place])   . Tobacco abuse   . Diarrhea 10/22/2011  . Tubular adenoma of colon   . Obstructive sleep apnea 03/27/2011  . Fasting hyperglycemia 03/27/2011  . Nephrolithiasis 03/27/2011  . Bladder carcinoma ([place]) 03/27/2011  . COPD (chronic obstructive pulmonary disease) ([place]) 02/24/2011  . Adenocarcinoma of right lung ([place]) 02/24/2011  . Arteriosclerotic cardiovascular disease (ASCVD) 01/06/2011  . Hypertension 01/06/2011  . CKD (chronic kidney disease) stage 3, GFR 30-59 ml/min 01/06/2011    Teena Irani, PTA/CLT [phone]  06/23/2016, 10:04 AM  [facility] [address] [place], [place], [address] Phone: [phone]   Fax:  [phone]  Name: Kyle [name] MRN: [id number] Date of Birth: [birth year]

## 2016-06-24 ENCOUNTER — Emergency Department (HOSPITAL_COMMUNITY)
Admission: EM | Admit: 2016-06-24 | Discharge: 2016-06-24 | Disposition: A | Discharge status: Home / Self Care | Payer: Medicare Other | Attending: Emergency Medicine | Admitting: Emergency Medicine

## 2016-06-24 ENCOUNTER — Encounter (HOSPITAL_COMMUNITY): Payer: Self-pay | Admitting: *Deleted

## 2016-06-24 ENCOUNTER — Other Ambulatory Visit: Payer: Self-pay

## 2016-06-24 ENCOUNTER — Telehealth: Payer: Self-pay

## 2016-06-24 ENCOUNTER — Emergency Department (HOSPITAL_COMMUNITY): Payer: Medicare Other

## 2016-06-24 DIAGNOSIS — F17210 Nicotine dependence, cigarettes, uncomplicated: Secondary | ICD-10-CM | POA: Diagnosis not present

## 2016-06-24 DIAGNOSIS — Z79899 Other long term (current) drug therapy: Secondary | ICD-10-CM | POA: Insufficient documentation

## 2016-06-24 DIAGNOSIS — E1122 Type 2 diabetes mellitus with diabetic chronic kidney disease: Secondary | ICD-10-CM | POA: Diagnosis not present

## 2016-06-24 DIAGNOSIS — I129 Hypertensive chronic kidney disease with stage 1 through stage 4 chronic kidney disease, or unspecified chronic kidney disease: Secondary | ICD-10-CM | POA: Diagnosis not present

## 2016-06-24 DIAGNOSIS — Z85520 Personal history of malignant carcinoid tumor of kidney: Secondary | ICD-10-CM | POA: Insufficient documentation

## 2016-06-24 DIAGNOSIS — J449 Chronic obstructive pulmonary disease, unspecified: Secondary | ICD-10-CM | POA: Diagnosis not present

## 2016-06-24 DIAGNOSIS — Z7982 Long term (current) use of aspirin: Secondary | ICD-10-CM | POA: Diagnosis not present

## 2016-06-24 DIAGNOSIS — N183 Chronic kidney disease, stage 3 (moderate): Secondary | ICD-10-CM | POA: Diagnosis not present

## 2016-06-24 DIAGNOSIS — Z85118 Personal history of other malignant neoplasm of bronchus and lung: Secondary | ICD-10-CM | POA: Insufficient documentation

## 2016-06-24 DIAGNOSIS — R05 Cough: Secondary | ICD-10-CM | POA: Diagnosis not present

## 2016-06-24 DIAGNOSIS — N3000 Acute cystitis without hematuria: Secondary | ICD-10-CM | POA: Diagnosis not present

## 2016-06-24 DIAGNOSIS — R4789 Other speech disturbances: Secondary | ICD-10-CM | POA: Diagnosis not present

## 2016-06-24 DIAGNOSIS — R531 Weakness: Secondary | ICD-10-CM

## 2016-06-24 DIAGNOSIS — I2510 Atherosclerotic heart disease of native coronary artery without angina pectoris: Secondary | ICD-10-CM | POA: Insufficient documentation

## 2016-06-24 LAB — CBC WITH DIFFERENTIAL/PLATELET
BASOS PCT: 0 %
Basophils Absolute: 0 10*3/uL (ref 0.0–0.1)
EOS ABS: 0.2 10*3/uL (ref 0.0–0.7)
EOS PCT: 4 %
HCT: 41.6 % (ref 39.0–52.0)
HEMOGLOBIN: 13.5 g/dL (ref 13.0–17.0)
LYMPHS ABS: 1.4 10*3/uL (ref 0.7–4.0)
Lymphocytes Relative: 21 %
MCH: 31.3 pg (ref 26.0–34.0)
MCHC: 32.5 g/dL (ref 30.0–36.0)
MCV: 96.3 fL (ref 78.0–100.0)
MONOS PCT: 7 %
Monocytes Absolute: 0.4 10*3/uL (ref 0.1–1.0)
NEUTROS PCT: 68 %
Neutro Abs: 4.3 10*3/uL (ref 1.7–7.7)
PLATELETS: 81 10*3/uL — AB (ref 150–400)
RBC: 4.32 MIL/uL (ref 4.22–5.81)
RDW: 16.4 % — AB (ref 11.5–15.5)
WBC: 6.3 10*3/uL (ref 4.0–10.5)

## 2016-06-24 LAB — COMPREHENSIVE METABOLIC PANEL
ALBUMIN: 3.9 g/dL (ref 3.5–5.0)
ALK PHOS: 58 U/L (ref 38–126)
ALT: 15 U/L — ABNORMAL LOW (ref 17–63)
ANION GAP: 5 (ref 5–15)
AST: 12 U/L — ABNORMAL LOW (ref 15–41)
BUN: 38 mg/dL — ABNORMAL HIGH (ref 6–20)
CALCIUM: 9.4 mg/dL (ref 8.9–10.3)
CHLORIDE: 109 mmol/L (ref 101–111)
CO2: 27 mmol/L (ref 22–32)
Creatinine, Ser: 2.61 mg/dL — ABNORMAL HIGH (ref 0.61–1.24)
GFR calc non Af Amer: 22 mL/min — ABNORMAL LOW (ref >60)
GFR, EST AFRICAN AMERICAN: 25 mL/min — AB (ref >60)
GLUCOSE: 163 mg/dL — AB (ref 65–99)
POTASSIUM: 4.3 mmol/L (ref 3.5–5.1)
SODIUM: 141 mmol/L (ref 135–145)
Total Bilirubin: 1 mg/dL (ref 0.3–1.2)
Total Protein: 7.1 g/dL (ref 6.5–8.1)

## 2016-06-24 LAB — URINALYSIS, MICROSCOPIC (REFLEX)

## 2016-06-24 LAB — LACTIC ACID, PLASMA
LACTIC ACID, VENOUS: 1 mmol/L (ref 0.5–1.9)
Lactic Acid, Venous: 1.1 mmol/L (ref 0.5–1.9)

## 2016-06-24 LAB — URINALYSIS, ROUTINE W REFLEX MICROSCOPIC
Bilirubin Urine: NEGATIVE
Glucose, UA: 100 mg/dL — AB
Ketones, ur: NEGATIVE mg/dL
LEUKOCYTES UA: NEGATIVE
NITRITE: NEGATIVE
PROTEIN: 100 mg/dL — AB
SPECIFIC GRAVITY, URINE: 1.025 (ref 1.005–1.030)
pH: 6 (ref 5.0–8.0)

## 2016-06-24 LAB — CBG MONITORING, ED: GLUCOSE-CAPILLARY: 165 mg/dL — AB (ref 65–99)

## 2016-06-24 LAB — TROPONIN I: Troponin I: <0.03 ng/mL (ref <0.03)

## 2016-06-24 MED ORDER — CEPHALEXIN 500 MG PO CAPS: 500.0000 mg | ORAL_CAPSULE | Freq: Three times a day (TID) | ORAL | 0 refills | Status: AC

## 2016-06-24 MED ORDER — SODIUM CHLORIDE 0.9 % IV BOLUS (SEPSIS)
1000.0000 mL | Freq: Once | INTRAVENOUS | Status: AC
  Administered 2016-06-24: 1000 mL via INTRAVENOUS

## 2016-06-24 MED ORDER — DEXTROSE 5 % IV SOLN
2.0000 g | Freq: Once | INTRAVENOUS | Status: AC
  Administered 2016-06-24: 2 g via INTRAVENOUS
  Filled 2016-06-24: qty 2

## 2016-06-24 NOTE — ED Notes (Signed)
Pt ambulates in hall with minimal assistance.  Pt says he feels better and ready to eat.

## 2016-06-24 NOTE — ED Triage Notes (Addendum)
Pt's family reports that pt had a "hard time getting his words out" on Sunday and was weak. Pt's wife reports this morning pt started having a hard time walking due to the weakness. Pt reports clear colored productive cough. Pt's wife reports pt accidentally took 2 tablets of his Celexa yesterday and today.

## 2016-06-24 NOTE — ED Provider Notes (Signed)
[facility] DEPT Provider Note   CSN: [id number] Arrival date & time: 06/24/16  1234     History   Chief Complaint Chief Complaint  Patient presents with  . Weakness    HPI Kyle [name] is a 82 y.o. male.  HPI 82 year old male with extensive past medical history as below who presents with generalized weakness. Patient and family states that over the last several days, the patient is been increasingly more tired than usual. He has had weakness with walking and seems to be more short of breath with walking, likely due to generalized fatigue. He also reportedly had mildly slurred speech, although on further questioning, this is more so due to general fatigue with clear and coherent speech. Patient has had similar symptoms in the past from urine infections. He denies any fevers. Denies any specific complaints at this time other than feeling tired. He has chronic elevation that has not acutely changed and he has no history of myasthenia or neurological issues. No recent medication changes. He did actually take a double dose of Celexa but this was only 40 mg. Denies any fevers. No flank pain. No chest pain. No shortness of breath.  Past Medical History:  Diagnosis Date  . AAA (abdominal aortic aneurysm) ([place]) 2004   s/p repair 2004; 4.3 cm infrarenal in 05/2011  . Abnormality of gait 02/23/2013  . Adenocarcinoma of right lung ([place]) 02/24/2011   Ct A/P 2012:  2cm lung mass RLL PET 5732:  Hypermetabolic RLL mass, no other hypermetabolic areas. TTNA 02/2011:  Adenocarcinoma, markers c/w lung origin Right lower lobe superior segmentectomy. 04/01/2011 Dr. Arlyce Dice   . Adenocarcinoma, lung ([place]) 01/2011   transthoracic FNA; resection of the superior segment of the RLL in 03/2011; negative nodes; no chemotherapy nor radiation planned  . Arm fracture    right arm  . Arteriosclerotic cardiovascular disease (ASCVD) 1973, 12/2010   S/P NSTEMI secondary to distal RCA/PL lesion, tx medically.  EF of  55%-60%  per  echo.  . Benign prostatic hypertrophy    s/p transurethral resection of the prostate  . Bilateral renal masses    Cystic, more prominent on CT in 12/2010 than 2007; followed by Dr. Rosana Hoes  . Bladder cancer [facility]) 1996   Transurethral resection of the bladder + chemotherapy/BCG as premed  . Cataract   . Chronic kidney disease    Creatinine 1.4 on discharge 12/20/2100; proteinuria; normal renal ultrasound in 2010; recent creatinines of 1.7-2.; Bilateral cystic renal masses by CT in 2011  . COPD (chronic obstructive pulmonary disease) ([place])   . Coronary artery disease   . Cough    thick phlegm  . Diabetes mellitus    Type II  . Diplopia 02/23/2013  . Diverticulosis   . Essential and other specified forms of tremor 02/23/2013  . Hx of Clostridium difficile infection   . Hyperlipidemia   . Hypertension   . Insomnia   . ITP (idiopathic thrombocytopenic purpura) 09/07/2012   Chronic ITP of adults versus medication-induced ITP.  Stable  . Myocardial infarction   . Nephrolithiasis 2012   ARF in 01/2011 due to obstructing nephrolithiasis  . Obesity   . OSA (obstructive sleep apnea)   . Polyneuropathy in diabetes(357.2) 02/23/2013  . Thrombocytopenia ([place])   . Tobacco abuse    50-pack-year consumption; quit in 12/2010  . Tubular adenoma of colon   . Ventral hernia     Patient Active Problem List   Diagnosis Date Noted  . Subdural hematoma, post-traumatic ([place]) 03/21/2016  .  Subdural hematoma ([place]) 03/21/2016  . Cough 10/03/2015  . Allergic rhinitis 10/03/2015  . Tremor, essential 08/27/2015  . History of colonic polyps   . Diverticulosis of colon without hemorrhage   . Essential and other specified forms of tremor 02/23/2013  . Diplopia 02/23/2013  . Diabetic polyneuropathy ([place]) 02/23/2013  . Abnormality of gait 02/23/2013  . COPD exacerbation ([place]) 01/31/2013  . Fracture of right distal radius 12/14/2012  . Diabetes ([place]) 11/30/2012  . ITP (idiopathic thrombocytopenic purpura)  09/07/2012  . LLQ pain 11/13/2011  . Ventral hernia 10/28/2011  . AAA (abdominal aortic aneurysm) ([place])   . Tobacco abuse   . Diarrhea 10/22/2011  . Tubular adenoma of colon   . Obstructive sleep apnea 03/27/2011  . Fasting hyperglycemia 03/27/2011  . Nephrolithiasis 03/27/2011  . Bladder carcinoma ([place]) 03/27/2011  . COPD (chronic obstructive pulmonary disease) ([place]) 02/24/2011  . Adenocarcinoma of right lung ([place]) 02/24/2011  . Arteriosclerotic cardiovascular disease (ASCVD) 01/06/2011  . Hypertension 01/06/2011  . CKD (chronic kidney disease) stage 3, GFR 30-59 ml/min 01/06/2011    Past Surgical History:  Procedure Laterality Date  . ABDOMINAL AORTIC ANEURYSM REPAIR  2004  . CARDIAC CATHETERIZATION    . COLONOSCOPY  03/19/2010   Dr. Gala Romney -(poor prep) Anal papilla, rectal hyperplastic polyp, tubular adenoma removed splenic flexure, left-sided diverticula  . COLONOSCOPY  11/28/2004   RMR:  Diminutive rectal and left colon polyps as described above, cold  biopsied/removed/  Left sided diverticula. The remainder of the colonic mucosa appeared normal.  . COLONOSCOPY   09/15/01   RMR: Multiple diminutive polyps destroyed with dermolysis as described above/ Multiple small polyps on stalks in the colon resected with snare cautery/ Scattered pan colonic diverticulum/ The remainder of the colonic mucosa appeared normal  . COLONOSCOPY N/A 05/01/2015   Procedure: COLONOSCOPY;  Surgeon: Daneil Dolin, MD;  Location: [facility];  Service: Endoscopy;  Laterality: N/A;  1115  . CYSTECTOMY    . CYSTOSCOPY  04/2014  . CYSTOSTOMY W/ BLADDER BIOPSY    . FLEXIBLE SIGMOIDOSCOPY  2014   Dr. Olevia Perches: tubular adenoma, negative stool studies   . LUNG LOBECTOMY    . TONSILLECTOMY    . TRANSURETHRAL RESECTION OF PROSTATE    . VIDEO BRONCHOSCOPY WITH ENDOBRONCHIAL NAVIGATION N/A 10/10/2013   Procedure: VIDEO BRONCHOSCOPY WITH ENDOBRONCHIAL NAVIGATION;  Surgeon: Melrose Nakayama, MD;  Location: [facility];  Service: Thoracic;  Laterality: N/A;  NO BLOOD THINNERS BUT PATIENT HAS ITP  . WEDGE RESECTION  04/2011   carcinoma of lung       Home Medications    Prior to Admission medications   Medication Sig Start Date End Date Taking? Authorizing Provider  aspirin EC 81 MG tablet Take 81 mg by mouth daily.   Yes Historical Provider, MD  calcitRIOL (ROCALTROL) 0.25 MCG capsule TAKE ONE CAPSULE ONCE DAILY AT 9:00AM 05/14/16  Yes Mikey Kirschner, MD  cetirizine (ZYRTEC) 10 MG tablet Take 1 tablet (10 mg total) by mouth daily. 11/15/14  Yes Mikey Kirschner, MD  citalopram (CELEXA) 20 MG tablet Take 1 tablet (20 mg total) by mouth daily. 06/17/16  Yes Mikey Kirschner, MD  fluticasone (FLONASE) 50 MCG/ACT nasal spray Place 1 spray into both nostrils daily. 03/24/16  Yes Ripudeep Krystal Eaton, MD  niacin (NIASPAN) 500 MG CR tablet Take 2 tablets (1,000 mg total) by mouth at bedtime. 11/15/14  Yes Mikey Kirschner, MD  pravastatin (PRAVACHOL) 20 MG tablet Take 20 mg by  mouth at bedtime.    Yes Historical Provider, MD  Probiotic Product (ALIGN PO) Take 1 capsule by mouth daily.   Yes Historical Provider, MD  SPIRIVA HANDIHALER 18 MCG inhalation capsule INHALE 1 DOSE BY MOUTH ONCE DAILY 02/12/16  Yes Mikey Kirschner, MD  vitamin B-12 1000 MCG tablet Take 1 tablet (1,000 mcg total) by mouth daily. 03/24/16  Yes Ripudeep Krystal Eaton, MD  ACCU-CHEK AVIVA PLUS test strip USE AS DIRECTED TO TEST ONCE DAILY. 05/30/16   Mikey Kirschner, MD  albuterol (PROAIR HFA) 108 (90 BASE) MCG/ACT inhaler Inhale 2 puffs into the lungs every 6 (six) hours as needed for wheezing. 11/15/14 05/06/18  Mikey Kirschner, MD  ALPRAZolam Duanne Moron) 0.5 MG tablet TAKE ONE TABLET TWICE DAILY AS NEEDED FOR ANXIETY Patient not taking: Reported on 06/24/2016 06/20/16   Mikey Kirschner, MD  cephALEXin (KEFLEX) 500 MG capsule Take 1 capsule (500 mg total) by mouth 3 (three) times daily. 06/24/16 07/01/16  Duffy Bruce, MD  ipratropium (ATROVENT) 0.06 %  nasal spray Place 2 sprays into both nostrils 4 (four) times daily. Patient not taking: Reported on 04/22/2016 11/15/14   Mikey Kirschner, MD  metoprolol (LOPRESSOR) 50 MG tablet Take 1/2 tablet by mouth twice daily Patient not taking: Reported on 06/24/2016 06/17/16   Mikey Kirschner, MD  nitroGLYCERIN (NITROSTAT) 0.4 MG SL tablet Place 1 tablet (0.4 mg total) under the tongue every 5 (five) minutes as needed. Call MD if need more than 2 Patient taking differently: Place 0.4 mg under the tongue every 5 (five) minutes as needed for chest pain. Call MD if need more than 2 04/18/16   Mikey Kirschner, MD    Family History Family History  Problem Relation Age of Onset  . Aortic aneurysm Mother   . Emphysema Father     smoker  . Clotting disorder Father   . Arthritis Father   . Hypertension Father   . Diabetes Father   . Aortic aneurysm Father   . Tremor Father   . Stroke Paternal Grandmother   . Other Paternal Grandfather     brain aneurysm  . Colon cancer Cousin     Social History Social History  Substance Use Topics  . Smoking status: Current Every Day Smoker    Packs/day: 1.00    Years: 50.00    Types: Cigarettes    Start date: 07/22/1955    Last attempt to quit: 12/19/2012  . Smokeless tobacco: Never Used  . Alcohol use No     Allergies   Codeine and Etodolac   Review of Systems Review of Systems  Constitutional: Positive for fatigue. Negative for chills and fever.  HENT: Negative for congestion and rhinorrhea.   Eyes: Negative for visual disturbance.  Respiratory: Negative for cough, shortness of breath and wheezing.   Cardiovascular: Negative for chest pain and leg swelling.  Gastrointestinal: Negative for abdominal pain, diarrhea, nausea and vomiting.  Genitourinary: Positive for frequency. Negative for dysuria and flank pain.  Musculoskeletal: Negative for neck pain and neck stiffness.  Skin: Negative for rash and wound.  Allergic/Immunologic: Negative for  immunocompromised state.  Neurological: Negative for syncope, weakness and headaches.  All other systems reviewed and are negative.    Physical Exam Updated Vital Signs BP 133/81 (BP Location: Left Arm)   Pulse 76   Temp 98.2 F (36.8 C) (Oral)   Resp 20   Ht 6' (1.829 m)   Wt 270 lb (122.5 kg)   SpO2  95%   BMI 36.62 kg/m   Physical Exam  Constitutional: He is oriented to person, place, and time. He appears well-developed and well-nourished. No distress.  HENT:  Head: Normocephalic and atraumatic.  Eyes: Conjunctivae are normal.  Neck: Neck supple.  Cardiovascular: Normal rate, regular rhythm and normal heart sounds.  Exam reveals no friction rub.   No murmur heard. Pulmonary/Chest: Effort normal and breath sounds normal. No respiratory distress. He has no wheezes. He has no rales.  Abdominal: He exhibits no distension.  Musculoskeletal: He exhibits no edema.  Neurological: He is alert and oriented to person, place, and time. He has normal strength. No sensory deficit. He exhibits normal muscle tone. Coordination and gait normal. GCS eye subscore is 4. GCS verbal subscore is 5. GCS motor subscore is 6.  Skin: Skin is warm. Capillary refill takes less than 2 seconds.  Psychiatric: He has a normal mood and affect.  Nursing note and vitals reviewed.    ED Treatments / Results  Labs (all labs ordered are listed, but only abnormal results are displayed) Labs Reviewed  CBC WITH DIFFERENTIAL/PLATELET - Abnormal; Notable for the following:       Result Value   RDW 16.4 (*)    Platelets 81 (*)    All other components within normal limits  COMPREHENSIVE METABOLIC PANEL - Abnormal; Notable for the following:    Glucose, Bld 163 (*)    BUN 38 (*)    Creatinine, Ser 2.61 (*)    AST 12 (*)    ALT 15 (*)    GFR calc non Af Amer 22 (*)    GFR calc Af Amer 25 (*)    All other components within normal limits  URINALYSIS, ROUTINE W REFLEX MICROSCOPIC - Abnormal; Notable for the  following:    Glucose, UA 100 (*)    Hgb urine dipstick TRACE (*)    Protein, ur 100 (*)    All other components within normal limits  URINALYSIS, MICROSCOPIC (REFLEX) - Abnormal; Notable for the following:    Bacteria, UA RARE (*)    Squamous Epithelial / LPF 0-5 (*)    All other components within normal limits  CBG MONITORING, ED - Abnormal; Notable for the following:    Glucose-Capillary 165 (*)    All other components within normal limits  LACTIC ACID, PLASMA  LACTIC ACID, PLASMA  TROPONIN I    EKG  EKG Interpretation  Date/Time:  Tuesday June 24 2016 14:07:18 EST Ventricular Rate:  76 PR Interval:    QRS Duration: 113 QT Interval:  385 QTC Calculation: 433 R Axis:   -50 Text Interpretation:  Sinus rhythm Prolonged PR interval LAD, consider left anterior fascicular block Abnormal R-wave progression, late transition Nonspecific repol abnormality, lateral leads No significant change since last tracing Confirmed by Josias Tomerlin MD, Alaney Witter [phone]) on 06/24/2016 3:54:03 PM       Radiology Dg Chest 2 View  Result Date: 06/24/2016 CLINICAL DATA:  Cough.  Weakness. EXAM: CHEST  2 VIEW COMPARISON:  CT 12/24/2015 .  Chest x-ray 05/30/2016. FINDINGS: Mediastinum and hilar structures are normal. Right perihilar atelectasis/mass/post treatment changes again noted. No change. Right base atelectasis again noted. Post treatment changes could also present this fashion . No pleural effusion or pneumothorax. Heart size normal. No acute bony abnormality . IMPRESSION: Right perihilar atelectasis/ mass/post treatment changes again noted. Right base atelectasis again noted. No interim change. Electronically Signed   By: Marcello Moores  Register   On: 06/24/2016 14:48   Ct  Head Wo Contrast  Result Date: 06/24/2016 CLINICAL DATA:  Speech disturbance 2 days ago with weakness. EXAM: CT HEAD WITHOUT CONTRAST TECHNIQUE: Contiguous axial images were obtained from the base of the skull through the vertex without  intravenous contrast. COMPARISON:  05/07/2016 FINDINGS: Brain: Age related generalized atrophy. No sign of acute infarction, mass lesion, hemorrhage, hydrocephalus or extra-axial collection. Vascular: There is atherosclerotic calcification of the major vessels at the base of the brain. Skull: Normal Sinuses/Orbits: Clear/normal Other: None significant IMPRESSION: No acute finding by CT.  Age related atrophy. Electronically Signed   By: Nelson Chimes M.D.   On: 06/24/2016 15:31    Procedures Procedures (including critical care time)  Medications Ordered in ED Medications  sodium chloride 0.9 % bolus 1,000 mL (0 mLs Intravenous Stopped 06/24/16 1514)  cefTRIAXone (ROCEPHIN) 2 g in dextrose 5 % 50 mL IVPB (0 g Intravenous Stopped 06/24/16 1805)     Initial Impression / Assessment and Plan / ED Course  I have reviewed the triage vital signs and the nursing notes.  Pertinent labs & imaging results that were available during my care of the patient were reviewed by me and considered in my medical decision making (see chart for details).  Clinical Course     82 year old male with past medical history as above who p/w generalized weakness. On arrival, vital signs are stable and within normal limits. Examination is as above. Neurological exam is nonfocal. Labwork reviewed as above. Patient has baseline CK D, with possible mild superimposed dehydration. Urinalysis shows pyuria and few bacteria consistent with UTI. Patient has normal white blood cell count, no fever, no vomiting, and denies any flank pain to suggest ascending infection or pyelonephritis. While in the ED, following IV fluids, patient states he feels much better and is ambulatory without difficulty and tolerating by mouth intake. Chest x-ray and CT head are unremarkable. He has normal neurological exam with no evidence to suggest intracranial lesions or neurological emergency. Suspect his symptoms are from a mild UTI and dehydration. Given his  well appearance, discussed management options with the family and they would like for outpatient management attempt. Will give IV Rocephin here and discharged on Keflex with good return precautions.  Final Clinical Impressions(s) / ED Diagnoses   Final diagnoses:  Weakness  Acute cystitis without hematuria    New Prescriptions Discharge Medication List as of 06/24/2016  6:11 PM    START taking these medications   Details  cephALEXin (KEFLEX) 500 MG capsule Take 1 capsule (500 mg total) by mouth 3 (three) times daily., Starting Tue 06/24/2016, Until Tue 07/01/2016, Print         Duffy Bruce, MD 06/25/16 1300

## 2016-06-24 NOTE — Telephone Encounter (Signed)
Wife called and stated that patient has been taking two celexa instead of one for the past three days. Patient is now lethargic, unsteady gait and slurred speech. Per Dr. Nicki Reaper- patient needs to be evaluated at the ER ASAP. Taking two celexa will not cause these symptoms. Could be possible stroke. Wife verbalized understanding and stated she will take him to the hospital.

## 2016-06-26 ENCOUNTER — Ambulatory Visit (HOSPITAL_COMMUNITY): Payer: Medicare Other | Admitting: Physical Therapy

## 2016-06-26 DIAGNOSIS — M6281 Muscle weakness (generalized): Secondary | ICD-10-CM

## 2016-06-26 DIAGNOSIS — R293 Abnormal posture: Secondary | ICD-10-CM | POA: Diagnosis not present

## 2016-06-26 DIAGNOSIS — R296 Repeated falls: Secondary | ICD-10-CM

## 2016-06-26 LAB — URINE CULTURE

## 2016-06-26 NOTE — Therapy (Signed)
[place], [place], [address] Phone: [phone]   Fax:  [phone]  Physical Therapy Treatment  Patient Details  Name: Kyle [name] MRN: [id number] Date of Birth: Jul 20, 1934 Referring Provider: Mickie Hillier   Encounter Date: 06/26/2016      PT End of Session - 06/26/16 1153    Visit Number 8   Number of Visits 16   Date for PT Re-Evaluation 07/04/16   Authorization Type [employer]- Medicare    Authorization Time Period 06/04/16-08/04/16: pt requests to be done at the end of the year.    Authorization - Visit Number 8   Authorization - Number of Visits 16   PT Start Time 1130   PT Stop Time 1200   PT Time Calculation (min) 30 min   Equipment Utilized During Treatment Gait belt   Activity Tolerance Patient tolerated treatment well;Patient limited by fatigue;No increased pain   Behavior During Therapy WFL for tasks assessed/performed      Past Medical History:  Diagnosis Date  . AAA (abdominal aortic aneurysm) ([place]) 2004   s/p repair 2004; 4.3 cm infrarenal in 05/2011  . Abnormality of gait 02/23/2013  . Adenocarcinoma of right lung ([place]) 02/24/2011   Ct A/P 2012:  2cm lung mass RLL PET 6568:  Hypermetabolic RLL mass, no other hypermetabolic areas. TTNA 02/2011:  Adenocarcinoma, markers c/w lung origin Right lower lobe superior segmentectomy. 04/01/2011 Dr. Arlyce Dice   . Adenocarcinoma, lung ([place]) 01/2011   transthoracic FNA; resection of the superior segment of the RLL in 03/2011; negative nodes; no chemotherapy nor radiation planned  . Arm fracture    right arm  . Arteriosclerotic cardiovascular disease (ASCVD) 1973, 12/2010   S/P NSTEMI secondary to distal RCA/PL lesion, tx medically.  EF of  55%-60% per  echo.  . Benign prostatic hypertrophy    s/p transurethral resection of the prostate  . Bilateral renal masses    Cystic, more prominent on CT in 12/2010 than 2007; followed by Dr. Rosana Hoes  . Bladder cancer [facility]) 1996    Transurethral resection of the bladder + chemotherapy/BCG as premed  . Cataract   . Chronic kidney disease    Creatinine 1.4 on discharge 12/20/2100; proteinuria; normal renal ultrasound in 2010; recent creatinines of 1.7-2.; Bilateral cystic renal masses by CT in 2011  . COPD (chronic obstructive pulmonary disease) ([place])   . Coronary artery disease   . Cough    thick phlegm  . Diabetes mellitus    Type II  . Diplopia 02/23/2013  . Diverticulosis   . Essential and other specified forms of tremor 02/23/2013  . Hx of Clostridium difficile infection   . Hyperlipidemia   . Hypertension   . Insomnia   . ITP (idiopathic thrombocytopenic purpura) 09/07/2012   Chronic ITP of adults versus medication-induced ITP.  Stable  . Myocardial infarction   . Nephrolithiasis 2012   ARF in 01/2011 due to obstructing nephrolithiasis  . Obesity   . OSA (obstructive sleep apnea)   . Polyneuropathy in diabetes(357.2) 02/23/2013  . Thrombocytopenia ([place])   . Tobacco abuse    50-pack-year consumption; quit in 12/2010  . Tubular adenoma of colon   . Ventral hernia     Past Surgical History:  Procedure Laterality Date  . ABDOMINAL AORTIC ANEURYSM REPAIR  2004  . CARDIAC CATHETERIZATION    . COLONOSCOPY  03/19/2010   Dr. Gala Romney -(poor prep) Anal papilla, rectal hyperplastic polyp, tubular adenoma removed splenic flexure, left-sided diverticula  .  COLONOSCOPY  11/28/2004   RMR:  Diminutive rectal and left colon polyps as described above, cold  biopsied/removed/  Left sided diverticula. The remainder of the colonic mucosa appeared normal.  . COLONOSCOPY   09/15/01   RMR: Multiple diminutive polyps destroyed with dermolysis as described above/ Multiple small polyps on stalks in the colon resected with snare cautery/ Scattered pan colonic diverticulum/ The remainder of the colonic mucosa appeared normal  . COLONOSCOPY N/A 05/01/2015   Procedure: COLONOSCOPY;  Surgeon: Daneil Dolin, MD;  [facility];   Service: Endoscopy;  Laterality: N/A;  1115  . CYSTECTOMY    . CYSTOSCOPY  04/2014  . CYSTOSTOMY W/ BLADDER BIOPSY    . FLEXIBLE SIGMOIDOSCOPY  2014   Dr. Olevia Perches: tubular adenoma, negative stool studies   . LUNG LOBECTOMY    . TONSILLECTOMY    . TRANSURETHRAL RESECTION OF PROSTATE    . VIDEO BRONCHOSCOPY WITH ENDOBRONCHIAL NAVIGATION N/A 10/10/2013   Procedure: VIDEO BRONCHOSCOPY WITH ENDOBRONCHIAL NAVIGATION;  Surgeon: Melrose Nakayama, MD;  Location: [place];  Service: Thoracic;  Laterality: N/A;  NO BLOOD THINNERS BUT PATIENT HAS ITP  . WEDGE RESECTION  04/2011   carcinoma of lung    There were no vitals filed for this visit.      Subjective Assessment - 06/26/16 1136    Subjective Pt states that he was feeling very bad Tuesday and went to the ER; he was diagnosed with a bladder infection.   He feels that overall he feels that he can walk better since starting physical therapy but he does not feel as good as he did prior to the bladder infection.     Pertinent History Multiple falls recently, including one last month that resulted in head trauma, hospital admission, and subsequent short term rehab admission. He estimates that he has fallen about 5 times in the last 6 months, twice missing a chair twice, and tumbling forward to pick up a newspaper.   How long can you sit comfortably? N/A   How long can you stand comfortably? Estimated to be about 15 minutes with onset of low back pain.    How long can you walk comfortably? Estimated to be about 15 minutes with onset of low back pain.    Currently in Pain? No/denies            [facility] PT Assessment - 06/26/16 0001      Assessment   Medical Diagnosis Multiple Falls    Referring Provider Mickie Hillier    Onset Date/Surgical Date --  Reports balance issues for about 2-3 years.    Hand Dominance Right   Next MD Visit February 2018   Prior [place] in October 2017 for this problem.      Precautions    Precautions Fall     Restrictions   Weight Bearing Restrictions No     Home Environment   Living Environment Private residence   Living Arrangements Spouse/significant other   Available Help at Discharge Family   Type of [place] to enter   Entrance Stairs-Number of Steps 2   Entrance Stairs-Rails Can reach both   [place] - 2 wheels;[place]udsen - single point  Walking stick     Prior Function   Level of Independence Independent;Independent with community mobility with device;Other (comment)  wife does grocery shopping   Vocation Retired   Leisure Likes to read the paper each day, game shows at  night     Observation/Other Assessments   Focus on Therapeutic Outcomes (FOTO)  --     Posture/Postural Control   Posture/Postural Control Postural limitations   Postural Limitations Rounded Shoulders;Forward head;Increased thoracic kyphosis;Anterior pelvic tilt     AROM   Right Ankle Dorsiflexion 14   Left Ankle Dorsiflexion 10     Strength   Right Hip Flexion 4+/5  was 4/5    Right Hip Extension 2+/5   Right Hip External Rotation  4/5   Right Hip Internal Rotation 4+/5   Right Hip ABduction 3+/5  supine was tested seated so will not translate    Right Hip ADduction 4+/5  was 4+/5    Left Hip Flexion 4-/5  was 4/5    Left Hip Extension 2+/5   Left Hip External Rotation 4/5   Left Hip Internal Rotation 4+/5   Left Hip ABduction 3+/5  was tested seated so will not transtate.    Left Hip ADduction 4/5   Right Knee Flexion 4/5  was 4-/5    Right Knee Extension 5/5   Left Knee Flexion 4/5  was 3+/5   Left Knee Extension 5/5     Bed Mobility   Bed Mobility Rolling Right;Rolling Left;Sit to Supine;Supine to Sit     Transfers   Transfers Sit to Stand   Sit to Stand 5: Supervision;Without upper extremity assist   Comments Moderate effort needed was maximal      Ambulation/Gait   Ambulation/Gait Yes   Ambulation/Gait Assistance 5:  Supervision   Ambulation Distance (Feet) 100 Feet   Assistive device --  walking stick   Gait velocity .628  meter/second was .6     Balance   Balance Assessed Yes     Standardized Balance Assessment   Standardized Balance Assessment Berg Balance Test     Berg Balance Test   Sit to Stand Able to stand without using hands and stabilize independently   Standing Unsupported Able to stand safely 2 minutes   Sitting with Back Unsupported but Feet Supported on Floor or Stool Able to sit safely and securely 2 minutes   Stand to Sit Sits safely with minimal use of hands   Transfers Able to transfer safely, minor use of hands   Standing Unsupported with Eyes Closed Able to stand 10 seconds safely   Standing Ubsupported with Feet Together Able to place feet together independently and stand for 1 minute with supervision   From Standing, Reach Forward with Outstretched Arm Can reach forward >12 cm safely (5")   From Standing Position, Pick up Object from Floor Able to pick up shoe, needs supervision   From Standing Position, Turn to Look Behind Over each Shoulder Turn sideways only but maintains balance   Turn 360 Degrees Able to turn 360 degrees safely but slowly   Standing Unsupported, Alternately Place Feet on Step/Stool Able to complete >2 steps/needs minimal assist  performs 8 in 24 seconds, with multiple LOB, self corrected   Standing Unsupported, One Foot in Front Able to take small step independently and hold 30 seconds   Standing on One Leg Tries to lift leg/unable to hold 3 seconds but remains standing independently   Total Score 41     Functional Gait  Assessment   Gait assessed  Yes                     [facility] Adult PT Treatment/Exercise - 06/26/16 0001      Knee/Hip Exercises:  Standing   Heel Raises Both;15 reps;2 sets   Heel Raises Limitations toe raises     Knee/Hip Exercises: Supine   Bridges/ SLR B  2 sets;5 reps   Other Supine Knee/Hip Exercises hip  ab/adduction with maual resistance x 10     Sit to stand x 5 GT with cane working on proper sequencing.              PT Short Term Goals - 08-Jul-2016 10/12/15      PT SHORT TERM GOAL #1   Title After 4 weeks patient will demonstrate improved balance AEB BBT> 45/56.    Baseline BBT: 41/56   Status On-going     PT SHORT TERM GOAL #2   Title After 4 weeks pt will demonstrate improved Maximal gait speed AEB 10MWT > 1.52ms.    Baseline 10MWT at eval: 0.641m; 1.0531mon 11/20    Status On-going     PT SHORT TERM GOAL #3   Title After 4 weeks patient will demonstrate improved functional strenght AEB ability to perform 5xSTS hands free in <30sec.    Status On-going           PT Long Term Goals - 12/Dec 19, 201711218-03-24   PT LONG TERM GOAL #1   Title After 8 weeks patient will demonstrate improved balance AEB BBT> 48/56.    Status On-going     PT LONG TERM GOAL #2   Title After 8 weeks pt will demonstrate improved maximal gait speed AEB 10MWT > 1.45m67m   Status On-going     PT LONG TERM GOAL #3   Title After 8 weeks patient will demonstrate improved functional strenght AEB ability to perform 5xSTS hands free in <25sec.    Status On-going               Plan - 12/0December 19, 20174    Clinical Impression Statement Pt went to ER on Tues03/24/24h diagnosis of a bladder infection.  He is not at top speed but states that he wants to participate in therapy.   PT demonstrated some improvement in mm strength and has not fallen although he continues to be unsteady on his feet with his cane.  He is not demonstrating proper gt technique with the cane without verbal cuing.  He will continue to benefit from skilled physical therapy to work on his balance, strength and gait.     Rehab Potential Good   PT Frequency 2x / week   PT Duration 8 weeks   PT Treatment/Interventions ADLs/Self Care Home Management;Moist Heat;Traction;Therapeutic activities;Therapeutic exercise;Balance  training;Neuromuscular re-education;Patient/family education;Gait training;Dry needling;Manual techniques;Passive range of motion   PT Next Visit Plan Proper gait with cane as well as begin more balance activity into treatment program.        Patient will benefit from skilled therapeutic intervention in order to improve the following deficits and impairments:  Abnormal gait, Decreased coordination, Decreased range of motion, Difficulty walking, Obesity, Pain, Improper body mechanics, Postural dysfunction, Decreased activity tolerance, Decreased balance, Decreased mobility, Decreased strength, Hypomobility  Visit Diagnosis: Repeated falls  Muscle weakness (generalized)  Abnormal posture       G-Codes - 12/02017-12-1991219/03/24Functional Assessment Tool Used clinical judgement;  gt and strength    Functional Limitation Changing and maintaining body position   Changing and Maintaining Body Position Current Status ([id number] least 40 percent but less than 60 percent impaired, limited or restricted   Changing and Maintaining Body Position  Goal Status [phone]) At least 40 percent but less than 60 percent impaired, limited or restricted      Problem List Patient Active Problem List   Diagnosis Date Noted  . Subdural hematoma, post-traumatic ([place]) 03/21/2016  . Subdural hematoma ([place]) 03/21/2016  . Cough 10/03/2015  . Allergic rhinitis 10/03/2015  . Tremor, essential 08/27/2015  . History of colonic polyps   . Diverticulosis of colon without hemorrhage   . Essential and other specified forms of tremor 02/23/2013  . Diplopia 02/23/2013  . Diabetic polyneuropathy ([place]) 02/23/2013  . Abnormality of gait 02/23/2013  . COPD exacerbation ([place]) 01/31/2013  . Fracture of right distal radius 12/14/2012  . Diabetes ([place]) 11/30/2012  . ITP (idiopathic thrombocytopenic purpura) 09/07/2012  . LLQ pain 11/13/2011  . Ventral hernia 10/28/2011  . AAA (abdominal aortic aneurysm) ([place])   . Tobacco abuse    . Diarrhea 10/22/2011  . Tubular adenoma of colon   . Obstructive sleep apnea 03/27/2011  . Fasting hyperglycemia 03/27/2011  . Nephrolithiasis 03/27/2011  . Bladder carcinoma ([place]) 03/27/2011  . COPD (chronic obstructive pulmonary disease) ([place]) 02/24/2011  . Adenocarcinoma of right lung ([place]) 02/24/2011  . Arteriosclerotic cardiovascular disease (ASCVD) 01/06/2011  . Hypertension 01/06/2011  . CKD (chronic kidney disease) stage 3, GFR 30-59 ml/min 01/06/2011    Rayetta Humphrey, PT CLT [phone] 06/26/2016, 12:20 PM  [place], [place], [address] Phone: [phone]   Fax:  [phone]  Name: KOI ZANGARA MRN: [id number] Date of Birth: May 10, 1934

## 2016-06-27 ENCOUNTER — Ambulatory Visit (INDEPENDENT_AMBULATORY_CARE_PROVIDER_SITE_OTHER): Payer: Medicare Other | Admitting: Family Medicine

## 2016-06-27 VITALS — BP 132/84 | Temp 97.9°F | Ht 72.0 in | Wt 266.0 lb

## 2016-06-27 DIAGNOSIS — I10 Essential (primary) hypertension: Secondary | ICD-10-CM | POA: Diagnosis not present

## 2016-06-27 DIAGNOSIS — R42 Dizziness and giddiness: Secondary | ICD-10-CM

## 2016-06-27 NOTE — Progress Notes (Signed)
   Subjective:    Patient ID: Kyle [name], male    DOB: 09/23/1933, 82 y.o.   MRN: [id number]  HPI Follow up ER visit. Was having some slurred speech. Found out he had UTI. Taking antibiotic. Urine culture done at hospital.   Pt was taking celexa '20mg'$  2 a day instead of one a day.  Apparently the patient was slower and is talking the normal earlier in the week and seems slightly confused and slow in movement. They were concerned about a stroke went to the hospital thorough evaluation they told him that more than likely it was urinary tract infection Fuzzy headed today. Pain across forehead when turning too fast.  Lab work scans culture all reviewed Review of Systems Denies any current chest tightness pressure pain shortness breath dizziness unilateral numbness or weakness no fever chills or sweats    Objective:   Physical Exam Lungs are clear hearts regular pulse normal blood pressure sitting and standing he does have a slight drop in blood pressure when he stands but not enough to adjust medication  Apparently they initially took him off medicine but then his blood pressure went up in Dr. Richardson Landry reinitiated low-dose     Assessment & Plan:  When the patient follows up I recommend a follow-up urinalysis  I believe the patient is doing better but has some mild orthostasis but I would recommend continuing the medication that he is on he is to have caution with standing rotating and bending. Patient will follow-up with Dr. Richardson Landry at regular check up. Follow-up sooner problems no evidence of stroke

## 2016-06-30 ENCOUNTER — Other Ambulatory Visit: Payer: Self-pay | Admitting: *Deleted

## 2016-06-30 ENCOUNTER — Ambulatory Visit (HOSPITAL_COMMUNITY): Payer: Medicare Other | Admitting: Physical Therapy

## 2016-06-30 DIAGNOSIS — N2581 Secondary hyperparathyroidism of renal origin: Secondary | ICD-10-CM | POA: Diagnosis not present

## 2016-06-30 DIAGNOSIS — E1122 Type 2 diabetes mellitus with diabetic chronic kidney disease: Secondary | ICD-10-CM | POA: Diagnosis not present

## 2016-06-30 DIAGNOSIS — Z823 Family history of stroke: Secondary | ICD-10-CM | POA: Diagnosis not present

## 2016-06-30 DIAGNOSIS — Z87442 Personal history of urinary calculi: Secondary | ICD-10-CM | POA: Diagnosis not present

## 2016-06-30 DIAGNOSIS — N200 Calculus of kidney: Secondary | ICD-10-CM | POA: Diagnosis not present

## 2016-06-30 DIAGNOSIS — Z833 Family history of diabetes mellitus: Secondary | ICD-10-CM | POA: Diagnosis not present

## 2016-06-30 DIAGNOSIS — N184 Chronic kidney disease, stage 4 (severe): Secondary | ICD-10-CM | POA: Insufficient documentation

## 2016-06-30 DIAGNOSIS — E785 Hyperlipidemia, unspecified: Secondary | ICD-10-CM | POA: Diagnosis not present

## 2016-06-30 DIAGNOSIS — I129 Hypertensive chronic kidney disease with stage 1 through stage 4 chronic kidney disease, or unspecified chronic kidney disease: Secondary | ICD-10-CM | POA: Diagnosis not present

## 2016-06-30 DIAGNOSIS — I2510 Atherosclerotic heart disease of native coronary artery without angina pectoris: Secondary | ICD-10-CM | POA: Diagnosis not present

## 2016-06-30 DIAGNOSIS — G4733 Obstructive sleep apnea (adult) (pediatric): Secondary | ICD-10-CM | POA: Diagnosis not present

## 2016-06-30 DIAGNOSIS — J449 Chronic obstructive pulmonary disease, unspecified: Secondary | ICD-10-CM | POA: Diagnosis not present

## 2016-06-30 DIAGNOSIS — Z7982 Long term (current) use of aspirin: Secondary | ICD-10-CM | POA: Diagnosis not present

## 2016-06-30 DIAGNOSIS — D631 Anemia in chronic kidney disease: Secondary | ICD-10-CM | POA: Diagnosis not present

## 2016-06-30 DIAGNOSIS — Z85118 Personal history of other malignant neoplasm of bronchus and lung: Secondary | ICD-10-CM | POA: Diagnosis not present

## 2016-06-30 NOTE — Patient Outreach (Signed)
[facility]) Care Management  06/30/2016  HANEEF HALLQUIST 1933/11/02 [id number]  DELAN KSIAZEK is a very pleasant 82year old patient who has been followed off and onby [facility] for greater than 3years. He has multiple chronic medical problems including HTN, DMII, CAD, COPD, and CKD. Mr. Heinlen fell earlier in November sustaining a subdural hematoma and required a 3 day hospital stay. He was transitioned to the nursing facility for rehab and recovery prior to discharging to home.   Mr. Kliebert lives at home with his wife [name] who is his primary caregiver. Mr. Ahart has followed up with Dr. Vertell Limber and has had repeat CT of his head. His blood pressure medications were adjusted during his hospitalization and he has been monitoring his BP at home. Mr. Swayne daily aspirin was discontinued and he is on fall precautions. Mr. Clear has also seen his ENT in follow up because of a nasal bone fracture sustained at the time of his fall. He has been going to outpatient PT for vestibular therapy and both he and his wife say today that they feel he is benefiting from this.  On 06/24/16, Mr. Vandezande was having some slurred speech at home. His wife had him transported to the ED and it was noted that he had been recently taking two '20mg'$  Celexa daily instead of the prescribed one tablet daily. This has been corrected by his wife. During Mr. [facility] ED evaluation, it was also noted that he had UTI. He has now completed his course of oral antibiotics and says he is symptom free.     Mr. Ruz says he has a good appetite and his wife confirms that he is eating 3 times daily. Mr. Mandler is now taking his medications correctly.   Last month, I planned to transition him to the telephonic case management team if all was well on today's call. Given that he recently had a medication mix up and the UTI, I will continue to follow him until these issues are resolved.   Mr. & Mrs. Que will do some short  distance traveling over the holidays and said they would take my phone number with them should they need any advice or help during their travels.   Plan: I will reach out to Mr. Thelen by phone to follow up with him just after Christmas.    [facility]  [phone]

## 2016-07-02 DIAGNOSIS — I2510 Atherosclerotic heart disease of native coronary artery without angina pectoris: Secondary | ICD-10-CM | POA: Diagnosis not present

## 2016-07-02 DIAGNOSIS — J449 Chronic obstructive pulmonary disease, unspecified: Secondary | ICD-10-CM | POA: Diagnosis not present

## 2016-07-02 DIAGNOSIS — I714 Abdominal aortic aneurysm, without rupture: Secondary | ICD-10-CM | POA: Diagnosis not present

## 2016-07-02 DIAGNOSIS — I10 Essential (primary) hypertension: Secondary | ICD-10-CM | POA: Diagnosis not present

## 2016-07-02 DIAGNOSIS — E785 Hyperlipidemia, unspecified: Secondary | ICD-10-CM | POA: Diagnosis not present

## 2016-07-03 ENCOUNTER — Ambulatory Visit (HOSPITAL_COMMUNITY): Payer: Medicare Other | Admitting: Physical Therapy

## 2016-07-03 DIAGNOSIS — M6281 Muscle weakness (generalized): Secondary | ICD-10-CM | POA: Diagnosis not present

## 2016-07-03 DIAGNOSIS — R296 Repeated falls: Secondary | ICD-10-CM

## 2016-07-03 DIAGNOSIS — R293 Abnormal posture: Secondary | ICD-10-CM | POA: Diagnosis not present

## 2016-07-03 NOTE — Therapy (Signed)
[place], [place], [address] Phone: [phone]   Fax:  [phone]  Physical Therapy Treatment  Patient Details  Name: Kyle [name] MRN: [id number] Date of Birth: July 31, 1933 Referring Provider: Mickie Hillier   Encounter Date: 07/03/2016      PT End of Session - 07/03/16 1416    Visit Number 9   Number of Visits 16   Date for PT Re-Evaluation 07/04/16   Authorization Type UHC- Medicare  gcode done on visit 8   Authorization Time Period 06/04/16-08/04/16: pt requests to be done at the end of the year.    Authorization - Visit Number 9   Authorization - Number of Visits 16   PT Start Time 6962   PT Stop Time 1348   PT Time Calculation (min) 43 min   Equipment Utilized During Treatment Gait belt   Activity Tolerance Patient tolerated treatment well;Patient limited by fatigue;No increased pain   Behavior During Therapy WFL for tasks assessed/performed      Past Medical History:  Diagnosis Date  . AAA (abdominal aortic aneurysm) ([place]) 2004   s/p repair 2004; 4.3 cm infrarenal in 05/2011  . Abnormality of gait 02/23/2013  . Adenocarcinoma of right lung ([place]) 02/24/2011   Ct A/P 2012:  2cm lung mass RLL PET 9528:  Hypermetabolic RLL mass, no other hypermetabolic areas. TTNA 02/2011:  Adenocarcinoma, markers c/w lung origin Right lower lobe superior segmentectomy. 04/01/2011 Dr. Arlyce Dice   . Adenocarcinoma, lung ([place]) 01/2011   transthoracic FNA; resection of the superior segment of the RLL in 03/2011; negative nodes; no chemotherapy nor radiation planned  . Arm fracture    right arm  . Arteriosclerotic cardiovascular disease (ASCVD) 1973, 12/2010   S/P NSTEMI secondary to distal RCA/PL lesion, tx medically.  EF of  55%-60% per  echo.  . Benign prostatic hypertrophy    s/p transurethral resection of the prostate  . Bilateral renal masses    Cystic, more prominent on CT in 12/2010 than 2007; followed by Dr. Rosana Hoes  .  Bladder cancer [facility]) 1996   Transurethral resection of the bladder + chemotherapy/BCG as premed  . Cataract   . Chronic kidney disease    Creatinine 1.4 on discharge 12/20/2100; proteinuria; normal renal ultrasound in 2010; recent creatinines of 1.7-2.; Bilateral cystic renal masses by CT in 2011  . COPD (chronic obstructive pulmonary disease) ([place])   . Coronary artery disease   . Cough    thick phlegm  . Diabetes mellitus    Type II  . Diplopia 02/23/2013  . Diverticulosis   . Essential and other specified forms of tremor 02/23/2013  . Hx of Clostridium difficile infection   . Hyperlipidemia   . Hypertension   . Insomnia   . ITP (idiopathic thrombocytopenic purpura) 09/07/2012   Chronic ITP of adults versus medication-induced ITP.  Stable  . Myocardial infarction   . Nephrolithiasis 2012   ARF in 01/2011 due to obstructing nephrolithiasis  . Obesity   . OSA (obstructive sleep apnea)   . Polyneuropathy in diabetes(357.2) 02/23/2013  . Thrombocytopenia ([place])   . Tobacco abuse    50-pack-year consumption; quit in 12/2010  . Tubular adenoma of colon   . Ventral hernia     Past Surgical History:  Procedure Laterality Date  . ABDOMINAL AORTIC ANEURYSM REPAIR  2004  . CARDIAC CATHETERIZATION    . COLONOSCOPY  03/19/2010   Dr. Gala Romney -(poor prep) Anal papilla, rectal hyperplastic polyp, tubular adenoma removed  splenic flexure, left-sided diverticula  . COLONOSCOPY  11/28/2004   RMR:  Diminutive rectal and left colon polyps as described above, cold  biopsied/removed/  Left sided diverticula. The remainder of the colonic mucosa appeared normal.  . COLONOSCOPY   09/15/01   RMR: Multiple diminutive polyps destroyed with dermolysis as described above/ Multiple small polyps on stalks in the colon resected with snare cautery/ Scattered pan colonic diverticulum/ The remainder of the colonic mucosa appeared normal  . COLONOSCOPY N/A 05/01/2015   Procedure: COLONOSCOPY;  Surgeon: Daneil Dolin, MD;   [facility];  Service: Endoscopy;  Laterality: N/A;  1115  . CYSTECTOMY    . CYSTOSCOPY  04/2014  . CYSTOSTOMY W/ BLADDER BIOPSY    . FLEXIBLE SIGMOIDOSCOPY  2014   Dr. Olevia Perches: tubular adenoma, negative stool studies   . LUNG LOBECTOMY    . TONSILLECTOMY    . TRANSURETHRAL RESECTION OF PROSTATE    . VIDEO BRONCHOSCOPY WITH ENDOBRONCHIAL NAVIGATION N/A 10/10/2013   Procedure: VIDEO BRONCHOSCOPY WITH ENDOBRONCHIAL NAVIGATION;  Surgeon: Melrose Nakayama, MD;  Location: [place];  Service: Thoracic;  Laterality: N/A;  NO BLOOD THINNERS BUT PATIENT HAS ITP  . WEDGE RESECTION  04/2011   carcinoma of lung    There were no vitals filed for this visit.      Subjective Assessment - 07/03/16 1420    Subjective Pt states he is feeling better than last visit but still weak and tires easily.   Currently in Pain? No/denies                         [facility] Adult PT Treatment/Exercise - 07/03/16 0001      Transfers   Transfers Sit to Stand   Sit to Stand 5: Supervision;Without upper extremity assist   Five time sit to stand comments  22" from floor     Knee/Hip Exercises: Standing   Heel Raises Both;15 reps;2 sets   Heel Raises Limitations toe raises   Lateral Step Up Both;10 reps;Step Height: 4";Hand Hold: 1     Knee/Hip Exercises: Supine   Bridges 2 sets;10 reps   Other Supine Knee/Hip Exercises clams with red theraband 10 reps each             Balance Exercises - 07/03/16 1402      Balance Exercises: Standing   Tandem Gait 1 rep   Retro Gait 1 rep   Sidestepping 1 rep             PT Short Term Goals - 06/26/16 1217      PT SHORT TERM GOAL #1   Title After 4 weeks patient will demonstrate improved balance AEB BBT> 45/56.    Baseline BBT: 41/56   Status On-going     PT SHORT TERM GOAL #2   Title After 4 weeks pt will demonstrate improved Maximal gait speed AEB 10MWT > 1.66ms.    Baseline 10MWT at eval: 0.668m; 1.0536mon 11/20     Status On-going     PT SHORT TERM GOAL #3   Title After 4 weeks patient will demonstrate improved functional strenght AEB ability to perform 5xSTS hands free in <30sec.    Status On-going           PT Long Term Goals - 06/26/16 1218      PT LONG TERM GOAL #1   Title After 8 weeks patient will demonstrate improved balance AEB BBT> 48/56.    Status  On-going     PT LONG TERM GOAL #2   Title After 8 weeks pt will demonstrate improved maximal gait speed AEB 10MWT > 1.52ms.    Status On-going     PT LONG TERM GOAL #3   Title After 8 weeks patient will demonstrate improved functional strenght AEB ability to perform 5xSTS hands free in <25sec.    Status On-going               Plan - 07/03/16 1413    Clinical Impression Statement Pt overall improved and getting over his bladder infection.  States he's still just fatigued, tiring easily.  Able to complete all therex with addition of side step ups and balance actvitieis.  Pt had to use at least 1 UE with side stepping due to weakness in LE's.  Cues to take larger step with side stepping and retro gait, however no LOB.  Tandem was most difficult for patient.  Instructed in logroll technique for sit to/from supine without useing hands to pull self up.  two seated rest breaks taken during session.   Rehab Potential Good   PT Frequency 2x / week   PT Duration 8 weeks   PT Treatment/Interventions ADLs/Self Care Home Management;Moist Heat;Traction;Therapeutic activities;Therapeutic exercise;Balance training;Neuromuscular re-education;Patient/family education;Gait training;Dry needling;Manual techniques;Passive range of motion   PT Next Visit Plan Proper gait with balance progression.      Patient will benefit from skilled therapeutic intervention in order to improve the following deficits and impairments:  Abnormal gait, Decreased coordination, Decreased range of motion, Difficulty walking, Obesity, Pain, Improper body mechanics, Postural  dysfunction, Decreased activity tolerance, Decreased balance, Decreased mobility, Decreased strength, Hypomobility  Visit Diagnosis: Repeated falls  Muscle weakness (generalized)  Abnormal posture     Problem List Patient Active Problem List   Diagnosis Date Noted  . Subdural hematoma, post-traumatic ([place] 03/21/2016  . Subdural hematoma ([place] 03/21/2016  . Cough 10/03/2015  . Allergic rhinitis 10/03/2015  . Tremor, essential 08/27/2015  . History of colonic polyps   . Diverticulosis of colon without hemorrhage   . Essential and other specified forms of tremor 02/23/2013  . Diplopia 02/23/2013  . Diabetic polyneuropathy ([facility] 02/23/2013  . Abnormality of gait 02/23/2013  . COPD exacerbation ([facility] 01/31/2013  . Fracture of right distal radius 12/14/2012  . Diabetes ([facility] 11/30/2012  . ITP (idiopathic thrombocytopenic purpura) 09/07/2012  . LLQ pain 11/13/2011  . Ventral hernia 10/28/2011  . AAA (abdominal aortic aneurysm) ([facility]   . Tobacco abuse   . Diarrhea 10/22/2011  . Tubular adenoma of colon   . Obstructive sleep apnea 03/27/2011  . Fasting hyperglycemia 03/27/2011  . Nephrolithiasis 03/27/2011  . Bladder carcinoma ([facility] 03/27/2011  . COPD (chronic obstructive pulmonary disease) ([facility] 02/24/2011  . Adenocarcinoma of right lung ([place] 02/24/2011  . Arteriosclerotic cardiovascular disease (ASCVD) 01/06/2011  . Hypertension 01/06/2011  . CKD (chronic kidney disease) stage 3, GFR 30-59 ml/min 01/06/2011    ATeena Irani PTA/CLT [phone] 07/03/2016, 2:20 PM  [id number] [address]Phone: [phone]  Fax:  [phone] Name: RZAIN LANKFORDMRN: [id number] of Birth: [birth year]

## 2016-07-07 ENCOUNTER — Ambulatory Visit (HOSPITAL_COMMUNITY): Payer: Medicare Other

## 2016-07-07 DIAGNOSIS — R293 Abnormal posture: Secondary | ICD-10-CM

## 2016-07-07 DIAGNOSIS — M6281 Muscle weakness (generalized): Secondary | ICD-10-CM | POA: Diagnosis not present

## 2016-07-07 DIAGNOSIS — R296 Repeated falls: Secondary | ICD-10-CM | POA: Diagnosis not present

## 2016-07-07 NOTE — Therapy (Signed)
[place], [place], [address] Phone: [phone]   Fax:  [phone]  Physical Therapy Treatment  Patient Details  Name: Kyle [name] MRN: [id number] Date of Birth: 08/14/1933 Referring Provider: Mickie Hillier   Encounter Date: 07/07/2016      PT End of Session - 07/07/16 1133    Visit Number 10   Number of Visits 16   Date for PT Re-Evaluation 07/04/16   Authorization Type UHC- Medicare  gcode done on visit 8   Authorization Time Period 06/04/16-08/04/16: pt requests to be done at the end of the year.    Authorization - Visit Number 10   Authorization - Number of Visits 16   PT Start Time 4580   PT Stop Time 1158   PT Time Calculation (min) 34 min   Equipment Utilized During Treatment Gait belt   Activity Tolerance Patient tolerated treatment well;Patient limited by fatigue;No increased pain   Behavior During Therapy WFL for tasks assessed/performed      Past Medical History:  Diagnosis Date  . AAA (abdominal aortic aneurysm) ([place]) 2004   s/p repair 2004; 4.3 cm infrarenal in 05/2011  . Abnormality of gait 02/23/2013  . Adenocarcinoma of right lung ([place]) 02/24/2011   Ct A/P 2012:  2cm lung mass RLL PET 9983:  Hypermetabolic RLL mass, no other hypermetabolic areas. TTNA 02/2011:  Adenocarcinoma, markers c/w lung origin Right lower lobe superior segmentectomy. 04/01/2011 Dr. Arlyce Dice   . Adenocarcinoma, lung ([place]) 01/2011   transthoracic FNA; resection of the superior segment of the RLL in 03/2011; negative nodes; no chemotherapy nor radiation planned  . Arm fracture    right arm  . Arteriosclerotic cardiovascular disease (ASCVD) 1973, 12/2010   S/P NSTEMI secondary to distal RCA/PL lesion, tx medically.  EF of  55%-60% per  echo.  . Benign prostatic hypertrophy    s/p transurethral resection of the prostate  . Bilateral renal masses    Cystic, more prominent on CT in 12/2010 than 2007; followed by Dr. Rosana Hoes  .  Bladder cancer [facility]) 1996   Transurethral resection of the bladder + chemotherapy/BCG as premed  . Cataract   . Chronic kidney disease    Creatinine 1.4 on discharge 12/20/2100; proteinuria; normal renal ultrasound in 2010; recent creatinines of 1.7-2.; Bilateral cystic renal masses by CT in 2011  . COPD (chronic obstructive pulmonary disease) ([place])   . Coronary artery disease   . Cough    thick phlegm  . Diabetes mellitus    Type II  . Diplopia 02/23/2013  . Diverticulosis   . Essential and other specified forms of tremor 02/23/2013  . Hx of Clostridium difficile infection   . Hyperlipidemia   . Hypertension   . Insomnia   . ITP (idiopathic thrombocytopenic purpura) 09/07/2012   Chronic ITP of adults versus medication-induced ITP.  Stable  . Myocardial infarction   . Nephrolithiasis 2012   ARF in 01/2011 due to obstructing nephrolithiasis  . Obesity   . OSA (obstructive sleep apnea)   . Polyneuropathy in diabetes(357.2) 02/23/2013  . Thrombocytopenia ([place])   . Tobacco abuse    50-pack-year consumption; quit in 12/2010  . Tubular adenoma of colon   . Ventral hernia     Past Surgical History:  Procedure Laterality Date  . ABDOMINAL AORTIC ANEURYSM REPAIR  2004  . CARDIAC CATHETERIZATION    . COLONOSCOPY  03/19/2010   Dr. Gala Romney -(poor prep) Anal papilla, rectal hyperplastic polyp, tubular adenoma removed  splenic flexure, left-sided diverticula  . COLONOSCOPY  11/28/2004   RMR:  Diminutive rectal and left colon polyps as described above, cold  biopsied/removed/  Left sided diverticula. The remainder of the colonic mucosa appeared normal.  . COLONOSCOPY   09/15/01   RMR: Multiple diminutive polyps destroyed with dermolysis as described above/ Multiple small polyps on stalks in the colon resected with snare cautery/ Scattered pan colonic diverticulum/ The remainder of the colonic mucosa appeared normal  . COLONOSCOPY N/A 05/01/2015   Procedure: COLONOSCOPY;  Surgeon: Daneil Dolin, MD;   [facility];  Service: Endoscopy;  Laterality: N/A;  1115  . CYSTECTOMY    . CYSTOSCOPY  04/2014  . CYSTOSTOMY W/ BLADDER BIOPSY    . FLEXIBLE SIGMOIDOSCOPY  2014   Dr. Olevia Perches: tubular adenoma, negative stool studies   . LUNG LOBECTOMY    . TONSILLECTOMY    . TRANSURETHRAL RESECTION OF PROSTATE    . VIDEO BRONCHOSCOPY WITH ENDOBRONCHIAL NAVIGATION N/A 10/10/2013   Procedure: VIDEO BRONCHOSCOPY WITH ENDOBRONCHIAL NAVIGATION;  Surgeon: Melrose Nakayama, MD;  Location: [place];  Service: Thoracic;  Laterality: N/A;  NO BLOOD THINNERS BUT PATIENT HAS ITP  . WEDGE RESECTION  04/2011   carcinoma of lung    There were no vitals filed for this visit.      Subjective Assessment - 07/07/16 1128    Subjective Pt doing well today, he says he is pain free, and HEP is goign fair. He c/o of some shoulder pain and brings his cane to have PT check the height and adjust as needed.    Pertinent History Multiple falls recently, including one last month that resulted in head trauma, hospital admission, and subsequent short term rehab admission. He estimates that he has fallen about 5 times in the last 6 months, twice missing a chair twice, and tumbling forward to pick up a newspaper.   Currently in Pain? No/denies                         [facility] Adult PT Treatment/Exercise - 07/07/16 0001      Therapeutic Activites    Other Therapeutic Activities HIIT: overground max velocity gait 2x90' (18, 22s)  retro gait c 10lb ball x1f; lateral step 1x535fbilat     Knee/Hip Exercises: Seated   Sit to Sand 10 reps;without UE support;2 sets  23" surface     Knee/Hip Exercises: Supine   Bridges Strengthening;4 sets  4x5 due to fatigue   Other Supine Knee/Hip Exercises Bent Knee Raises: 3x10 bilat                  PT Short Term Goals - 06/26/16 1217      PT SHORT TERM GOAL #1   Title After 4 weeks patient will demonstrate improved balance AEB BBT> 45/56.    Baseline  BBT: 41/56   Status On-going     PT SHORT TERM GOAL #2   Title After 4 weeks pt will demonstrate improved Maximal gait speed AEB 10MWT > 1.1063m    Baseline 10MWT at eval: 0.76m63m1.73m/54m 11/20    Status On-going     PT SHORT TERM GOAL #3   Title After 4 weeks patient will demonstrate improved functional strenght AEB ability to perform 5xSTS hands free in <30sec.    Status On-going           PT Long Term Goals - 06/26/16 1218  PT LONG TERM GOAL #1   Title After 8 weeks patient will demonstrate improved balance AEB BBT> 48/56.    Status On-going     PT LONG TERM GOAL #2   Title After 8 weeks pt will demonstrate improved maximal gait speed AEB 10MWT > 1.26ms.    Status On-going     PT LONG TERM GOAL #3   Title After 8 weeks patient will demonstrate improved functional strenght AEB ability to perform 5xSTS hands free in <25sec.    Status On-going               Plan - 07/07/16 1137    Clinical Impression Statement Pt tolerating session well, still demonstrating very quick onset of fatigue for hip extension strengthening, hence greater focus on lower reps, greater sets. Continued with advanced gait training to improve stability overall during daily activity. Pt reports hes had no falls since he started PT and he thinks his balance at home is improving. Continued focus on strengthen focally and balance training in a funcitonal context. No change to POC at this time.  Pt asked to leave session a bit early to meet his wife for lunch.     Rehab Potential Good   PT Frequency 2x / week   PT Duration 8 weeks   PT Treatment/Interventions ADLs/Self Care Home Management;Moist Heat;Traction;Therapeutic activities;Therapeutic exercise;Balance training;Neuromuscular re-education;Patient/family education;Gait training;Dry needling;Manual techniques;Passive range of motion   PT Next Visit Plan Proper gait with balance progression.   PT Home Exercise Plan Eval: Bridge, tandem  stance 3x30sec, STS from elevated surface, seated marching.    Consulted and Agree with Plan of Care Patient      Patient will benefit from skilled therapeutic intervention in order to improve the following deficits and impairments:  Abnormal gait, Decreased coordination, Decreased range of motion, Difficulty walking, Obesity, Pain, Improper body mechanics, Postural dysfunction, Decreased activity tolerance, Decreased balance, Decreased mobility, Decreased strength, Hypomobility  Visit Diagnosis: Repeated falls  Muscle weakness (generalized)  Abnormal posture     Problem List Patient Active Problem List   Diagnosis Date Noted  . Subdural hematoma, post-traumatic ([place] 03/21/2016  . Subdural hematoma ([place] 03/21/2016  . Cough 10/03/2015  . Allergic rhinitis 10/03/2015  . Tremor, essential 08/27/2015  . History of colonic polyps   . Diverticulosis of colon without hemorrhage   . Essential and other specified forms of tremor 02/23/2013  . Diplopia 02/23/2013  . Diabetic polyneuropathy ([place] 02/23/2013  . Abnormality of gait 02/23/2013  . COPD exacerbation ([facility] 01/31/2013  . Fracture of right distal radius 12/14/2012  . Diabetes ([facility] 11/30/2012  . ITP (idiopathic thrombocytopenic purpura) 09/07/2012  . LLQ pain 11/13/2011  . Ventral hernia 10/28/2011  . AAA (abdominal aortic aneurysm) ([facility]   . Tobacco abuse   . Diarrhea 10/22/2011  . Tubular adenoma of colon   . Obstructive sleep apnea 03/27/2011  . Fasting hyperglycemia 03/27/2011  . Nephrolithiasis 03/27/2011  . Bladder carcinoma ([facility] 03/27/2011  . COPD (chronic obstructive pulmonary disease) ([place] 02/24/2011  . Adenocarcinoma of right lung ([facility] 02/24/2011  . Arteriosclerotic cardiovascular disease (ASCVD) 01/06/2011  . Hypertension 01/06/2011  . CKD (chronic kidney disease) stage 3, GFR 30-59 ml/min 01/06/2011    12:03 PM, 07/07/16 AEtta Grandchild PT, DPT Physical Therapist at [phone](office)    [id number] [address]Phone: [phone]  Fax:  [phone] Name: RIOSEFA WEINTRAUBMRN:  [id number] Date of Birth: March 14, 1934

## 2016-07-10 ENCOUNTER — Ambulatory Visit (HOSPITAL_COMMUNITY): Payer: Medicare Other | Admitting: Physical Therapy

## 2016-07-10 DIAGNOSIS — R293 Abnormal posture: Secondary | ICD-10-CM

## 2016-07-10 DIAGNOSIS — M6281 Muscle weakness (generalized): Secondary | ICD-10-CM | POA: Diagnosis not present

## 2016-07-10 DIAGNOSIS — R296 Repeated falls: Secondary | ICD-10-CM

## 2016-07-10 NOTE — Therapy (Signed)
[address] [place], [place], [address] Phone: [phone]   Fax:  [phone]  Physical Therapy Treatment  Patient Details  Name: Kyle [name] MRN: [id number] Date of Birth: [birth year] Kyle [name] Referring Provider: Mickie Hillier   Encounter Date: 07/10/2016      PT End of Session - 07/10/16 1537    Visit Number 11   Number of Visits 16   Date for PT Re-Evaluation 07/04/16   Authorization Type [employer]- Medicare  gcode done on visit 8   Authorization Time Period 06/04/16-08/04/16: pt requests to be done at the end of the year.    Authorization - Visit Number 11   Authorization - Number of Visits 16   PT Start Time 1304   PT Stop Time 1350   PT Time Calculation (min) 46 min   Equipment Utilized During Treatment Gait belt   Activity Tolerance Patient tolerated treatment well;Patient limited by fatigue;No increased pain   Behavior During Therapy WFL for tasks assessed/performed      Past Medical History:  Diagnosis Date  . AAA (abdominal aortic aneurysm) ([place]) 2004   s/p repair 2004; 4.3 cm infrarenal in 05/2011  . Abnormality of gait 02/23/2013  . Adenocarcinoma of right lung ([place]) 02/24/2011   Ct A/P 2012:  2cm lung mass RLL PET 4193:  Hypermetabolic RLL mass, no other hypermetabolic areas. TTNA 02/2011:  Adenocarcinoma, markers c/w lung origin Right lower lobe superior segmentectomy. 04/01/2011 Dr. Arlyce Dice   . Adenocarcinoma, lung ([place]) 01/2011   transthoracic FNA; resection of the superior segment of the RLL in 03/2011; negative nodes; no chemotherapy nor radiation planned  . Arm fracture    right arm  . Arteriosclerotic cardiovascular disease (ASCVD) 1973, 12/2010   S/P NSTEMI secondary to distal RCA/PL lesion, tx medically.  EF of  55%-60% per  echo.  . Benign prostatic hypertrophy    s/p transurethral resection of the prostate  . Bilateral renal masses    Cystic, more prominent on CT in 12/2010 than 2007; followed by Dr. Rosana Hoes  .  Bladder cancer [facility]) 1996   Transurethral resection of the bladder + chemotherapy/BCG as premed  . Cataract   . Chronic kidney disease    Creatinine 1.4 on discharge 12/20/2100; proteinuria; normal renal ultrasound in 2010; recent creatinines of 1.7-2.; Bilateral cystic renal masses by CT in 2011  . COPD (chronic obstructive pulmonary disease) ([place])   . Coronary artery disease   . Cough    thick phlegm  . Diabetes mellitus    Type II  . Diplopia 02/23/2013  . Diverticulosis   . Essential and other specified forms of tremor 02/23/2013  . Hx of Clostridium difficile infection   . Hyperlipidemia   . Hypertension   . Insomnia   . ITP (idiopathic thrombocytopenic purpura) 09/07/2012   Chronic ITP of adults versus medication-induced ITP.  Stable  . Myocardial infarction   . Nephrolithiasis 2012   ARF in 01/2011 due to obstructing nephrolithiasis  . Obesity   . OSA (obstructive sleep apnea)   . Polyneuropathy in diabetes(357.2) 02/23/2013  . Thrombocytopenia ([place])   . Tobacco abuse    50-pack-year consumption; quit in 12/2010  . Tubular adenoma of colon   . Ventral hernia     Past Surgical History:  Procedure Laterality Date  . ABDOMINAL AORTIC ANEURYSM REPAIR  2004  . CARDIAC CATHETERIZATION    . COLONOSCOPY  03/19/2010   Dr. Gala Romney -(poor prep) Anal papilla, rectal hyperplastic polyp, tubular adenoma removed  splenic flexure, left-sided diverticula  . COLONOSCOPY  11/28/2004   RMR:  Diminutive rectal and left colon polyps as described above, cold  biopsied/removed/  Left sided diverticula. The remainder of the colonic mucosa appeared normal.  . COLONOSCOPY   09/15/01   RMR: Multiple diminutive polyps destroyed with dermolysis as described above/ Multiple small polyps on stalks in the colon resected with snare cautery/ Scattered pan colonic diverticulum/ The remainder of the colonic mucosa appeared normal  . COLONOSCOPY N/A 05/01/2015   Procedure: COLONOSCOPY;  Surgeon: Daneil Dolin, MD;   [facility];  Service: Endoscopy;  Laterality: N/A;  1115  . CYSTECTOMY    . CYSTOSCOPY  04/2014  . CYSTOSTOMY W/ BLADDER BIOPSY    . FLEXIBLE SIGMOIDOSCOPY  2014   Dr. Olevia Perches: tubular adenoma, negative stool studies   . LUNG LOBECTOMY    . TONSILLECTOMY    . TRANSURETHRAL RESECTION OF PROSTATE    . VIDEO BRONCHOSCOPY WITH ENDOBRONCHIAL NAVIGATION N/A 10/10/2013   Procedure: VIDEO BRONCHOSCOPY WITH ENDOBRONCHIAL NAVIGATION;  Surgeon: Melrose Nakayama, MD;  Location: [facility];  Service: Thoracic;  Laterality: N/A;  NO BLOOD THINNERS BUT PATIENT HAS ITP  . WEDGE RESECTION  04/2011   carcinoma of lung    There were no vitals filed for this visit.      Subjective Assessment - 07/10/16 1329    Subjective Pt states he's feeling the best he has in a while.  No pain today just stiffness.  Pt reports his balance is better as far as no falls or stumbles, however still difficult when challenged and completing dynamic actvities here at therapy.                         [place] Adult PT Treatment/Exercise - 07/10/16 0001      Knee/Hip Exercises: Standing   Heel Raises Both;15 reps;2 sets   Forward Lunges Both;10 reps   Forward Lunges Limitations 4" step intermittent HHA   Lateral Step Up Both;Step Height: 4";Hand Hold: 1;15 reps   Gait Training (P)  one lap (226 feet) in 1:30" no AD     Knee/Hip Exercises: Seated   Sit to Sand 10 reps;without UE support;2 sets  from standard chair with airex pad in seat             Balance Exercises - 07/10/16 1328      Balance Exercises: Standing   Tandem Gait 2 reps   Retro Gait 2 reps   Sidestepping 2 reps             PT Short Term Goals - 06/26/16 1217      PT SHORT TERM GOAL #1   Title After 4 weeks patient will demonstrate improved balance AEB BBT> 45/56.    Baseline BBT: 41/56   Status On-going     PT SHORT TERM GOAL #2   Title After 4 weeks pt will demonstrate improved Maximal gait speed AEB 10MWT  > 1.80ms.    Baseline 10MWT at eval: 0.655m; 1.0550mon 11/20    Status On-going     PT SHORT TERM GOAL #3   Title After 4 weeks patient will demonstrate improved functional strenght AEB ability to perform 5xSTS hands free in <30sec.    Status On-going           PT Long Term Goals - 06/26/16 1218      PT LONG TERM GOAL #1   Title After 8 weeks patient  will demonstrate improved balance AEB BBT> 48/56.    Status On-going     PT LONG TERM GOAL #2   Title After 8 weeks pt will demonstrate improved maximal gait speed AEB 10MWT > 1.4ms.    Status On-going     PT LONG TERM GOAL #3   Title After 8 weeks patient will demonstrate improved functional strenght AEB ability to perform 5xSTS hands free in <25sec.    Status On-going               Plan - 07/10/16 1539    Clinical Impression Statement Continued with focus on functional strengthening, balance and activity tolerance.  Pt required only 3 total seated rest breaks this session and one at end of session peiror to leaving therapy.  Able to complete one full lap around the gym in 1:30" without AD with minimal exertion.  Tandem gait continues to be most difficult for pateint with improved retro gait with forward focus.  Added forward lunges this session without UE assist to work on [place]  Overall completed session well with minimal cues.     Rehab Potential Good   PT Frequency 2x / week   PT Duration 8 weeks   PT Treatment/Interventions ADLs/Self Care Home Management;Moist Heat;Traction;Therapeutic activities;Therapeutic exercise;Balance training;Neuromuscular re-education;Patient/family education;Gait training;Dry needling;Manual techniques;Passive range of motion   PT Next Visit Plan Proper gait, functional strength and dynamic balance progression.   PT Home Exercise Plan Eval: Bridge, tandem stance 3x30sec, STS from elevated surface, seated marching.    Consulted and Agree with Plan of Care Patient      Patient  will benefit from skilled therapeutic intervention in order to improve the following deficits and impairments:  Abnormal gait, Decreased coordination, Decreased range of motion, Difficulty walking, Obesity, Pain, Improper body mechanics, Postural dysfunction, Decreased activity tolerance, Decreased balance, Decreased mobility, Decreased strength, Hypomobility  Visit Diagnosis: Repeated falls  Muscle weakness (generalized)  Abnormal posture     Problem List Patient Active Problem List   Diagnosis Date Noted  . Subdural hematoma, post-traumatic ([place] 03/21/2016  . Subdural hematoma ([place] 03/21/2016  . Cough 10/03/2015  . Allergic rhinitis 10/03/2015  . Tremor, essential 08/27/2015  . History of colonic polyps   . Diverticulosis of colon without hemorrhage   . Essential and other specified forms of tremor 02/23/2013  . Diplopia 02/23/2013  . Diabetic polyneuropathy ([place] 02/23/2013  . Abnormality of gait 02/23/2013  . COPD exacerbation ([place] 01/31/2013  . Fracture of right distal radius 12/14/2012  . Diabetes ([place] 11/30/2012  . ITP (idiopathic thrombocytopenic purpura) 09/07/2012  . LLQ pain 11/13/2011  . Ventral hernia 10/28/2011  . AAA (abdominal aortic aneurysm) ([place]   . Tobacco abuse   . Diarrhea 10/22/2011  . Tubular adenoma of colon   . Obstructive sleep apnea 03/27/2011  . Fasting hyperglycemia 03/27/2011  . Nephrolithiasis 03/27/2011  . Bladder carcinoma ([place] 03/27/2011  . COPD (chronic obstructive pulmonary disease) ([place] 02/24/2011  . Adenocarcinoma of right lung ([facility] 02/24/2011  . Arteriosclerotic cardiovascular disease (ASCVD) 01/06/2011  . Hypertension 01/06/2011  . CKD (chronic kidney disease) stage 3, GFR 30-59 ml/min 01/06/2011    ATeena Irani PTA/CLT [phone] 07/10/2016, 3:43 PM  [place] [address]Phone: [phone]  Fax:  [phone] Name: Kyle [name], Kyle [name]

## 2016-07-11 ENCOUNTER — Telehealth (HOSPITAL_COMMUNITY): Payer: Self-pay | Admitting: *Deleted

## 2016-07-11 DIAGNOSIS — C675 Malignant neoplasm of bladder neck: Secondary | ICD-10-CM | POA: Diagnosis not present

## 2016-07-11 NOTE — Telephone Encounter (Signed)
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 Tressie Stalker for it and he has moved. Sons says he sees Dr. Whitney Muse for lung cancer. Urologist, Dr. 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 Son wasn't sure Dr. Whitney Muse would do this since she didn't see him for his urethral cancer. I explained to son that I thought she would but will send message to be sure.

## 2016-07-15 ENCOUNTER — Ambulatory Visit (HOSPITAL_COMMUNITY): Payer: Medicare Other

## 2016-07-15 NOTE — Telephone Encounter (Signed)
07/15/16 pt cx because he had a cystoscope on Friday and can't stay out of the bathroom

## 2016-07-16 ENCOUNTER — Other Ambulatory Visit: Payer: Self-pay | Admitting: Licensed Clinical Social Worker

## 2016-07-16 DIAGNOSIS — R399 Unspecified symptoms and signs involving the genitourinary system: Secondary | ICD-10-CM | POA: Diagnosis not present

## 2016-07-16 NOTE — Patient Outreach (Signed)
Assessment:  CSW spoke via phone with client. CSW verified client identity. CSW and client spoke of client needs.  Client receives strong support from his spouse, Ziere Docken.  [name] transports client to and from client's scheduled medical appointments. [name] supports client in meeting daily needs of client. Client has said he also has some support from his son. Client said he is eating well and sleeping well. Client sees Dr. Harmon Pier as primary care doctor. Client has a scheduled appointment with Dr. Grace Bushy. [place] on 07/22/16. Client uses a walker to ambulate in the home. Client said he uses a cane to assist him in ambulating when client is in the community. Client said he has had a history of falls. He has completed home health physical therapy sessions for client. Client has recently been receiving Outpatient Physical Therapy Sessions, as scheduled, in [place], [place]. [place] and client spoke of client care plan. CSW encouraged client to participate in all scheduled client Outpatient Physical Therapy sessions for client in next 30 days. Client also receives [place] support with RN [name]. Client and spouse reported that client was going to have a lab draw today in [place], [place] related to possible urinary tract infection for client. Client did recently complete one round of antibiotics for urinary tract infection.  Bryant Saye also reported to [place] on 07/16/16 that client had a cystoscopy recently and that client had been going more to the bathroom recenlty since client had cystoscopy. CSW encouraged that client or [name] call CSW at [phone] as needed to discus social work needs of client. CSW encouraged that client or [name] call RN [name] at [phone] as needed to discuss nursing needs of client.  Client and Jabbar Palmero were appreciative of call from [place] on 07/16/16. Following CSW call with client and Yousaf Sainato on 07/16/16, CSW then called RN [name] on 07/16/16 and  communicated above information to RN regarding current client needs.    Plan:  Client to participate in all scheduled client Outpatient Physical Therapy sessions for client in next 30 days.  CSW to collaborate with RN [name] in monitoring needs of client.  CSW to call client in 4 weeks to assess client needs at that time.  [name].Makinley Muscato MSW, LCSW Licensed Clinical Social Worker [facility] [phone]

## 2016-07-17 ENCOUNTER — Ambulatory Visit (HOSPITAL_COMMUNITY): Payer: Medicare Other | Admitting: Physical Therapy

## 2016-07-17 DIAGNOSIS — M6281 Muscle weakness (generalized): Secondary | ICD-10-CM | POA: Diagnosis not present

## 2016-07-17 DIAGNOSIS — R296 Repeated falls: Secondary | ICD-10-CM | POA: Diagnosis not present

## 2016-07-17 DIAGNOSIS — R293 Abnormal posture: Secondary | ICD-10-CM

## 2016-07-17 NOTE — Patient Instructions (Addendum)
Balance: Unilateral   At the kitchen sink.  Attempt to balance on left leg, eyes open. Hold _15___ seconds. Repeat __10__ times per set. Do _1___ sets per session. Do _3___ sessions per day. Perform exercise with eyes closed.  http://orth.exer.us/28   Copyright  VHI. All rights reserved.  Bridging    Slowly raise buttocks from floor, keeping stomach tight. Repeat 10____ times per set. Do __1__ sets per session. Do __2__ sessions per day.  http://orth.exer.us/1096   Copyright  VHI. All rights reserved.  Functional Quadriceps: Chair Squat    Keeping feet flat on floor, shoulder width apart, squat as low as is comfortable. Use support as necessary. Repeat __10__ times per set. Do ___1_ sets per session. Do __2__ sessions per day.  http://orth.exer.us/736   Copyright  VHI. All rights reserved.  Functional Quadriceps: Sit to Stand    Sit on edge of chair, feet flat on floor. Stand upright, extending knees fully. Repeat __5__ times per set. Do ___1_ sets per session. Do ____ sessions per day. 2 http://orth.exer.us/734   Copyright  VHI. All rights reserved.

## 2016-07-17 NOTE — Therapy (Signed)
[place], [place], [address] Phone: [phone]   Fax:  [phone]  Physical Therapy Treatment  Patient Details  Name: Kyle [name] MRN: [id number] Date of Birth: February 18, 1934 Referring Provider: Mickie Hillier   Encounter Date: 07/17/2016      PT End of Session - 07/17/16 1338    Visit Number 12   Number of Visits 12   Date for PT Re-Evaluation 07/04/16   Authorization Type UHC- Medicare  gcode done on visit 8   Authorization Time Period 06/04/16-08/04/16: pt requests to be done at the end of the year.    Authorization - Visit Number 12   Authorization - Number of Visits 12   Equipment Utilized During Treatment Gait belt   Activity Tolerance Patient tolerated treatment well;Patient limited by fatigue;No increased pain   Behavior During Therapy WFL for tasks assessed/performed      Past Medical History:  Diagnosis Date  . AAA (abdominal aortic aneurysm) ([place]) 2004   s/p repair 2004; 4.3 cm infrarenal in 05/2011  . Abnormality of gait 02/23/2013  . Adenocarcinoma of right lung ([place]) 02/24/2011   Ct A/P 2012:  2cm lung mass RLL PET 4287:  Hypermetabolic RLL mass, no other hypermetabolic areas. TTNA 02/2011:  Adenocarcinoma, markers c/w lung origin Right lower lobe superior segmentectomy. 04/01/2011 Dr. Arlyce Dice   . Adenocarcinoma, lung ([place]) 01/2011   transthoracic FNA; resection of the superior segment of the RLL in 03/2011; negative nodes; no chemotherapy nor radiation planned  . Arm fracture    right arm  . Arteriosclerotic cardiovascular disease (ASCVD) 1973, 12/2010   S/P NSTEMI secondary to distal RCA/PL lesion, tx medically.  EF of  55%-60% per  echo.  . Benign prostatic hypertrophy    s/p transurethral resection of the prostate  . Bilateral renal masses    Cystic, more prominent on CT in 12/2010 than 2007; followed by Dr. Rosana Hoes  . Bladder cancer [facility]) 1996   Transurethral resection of the bladder +  chemotherapy/BCG as premed  . Cataract   . Chronic kidney disease    Creatinine 1.4 on discharge 12/20/2100; proteinuria; normal renal ultrasound in 2010; recent creatinines of 1.7-2.; Bilateral cystic renal masses by CT in 2011  . COPD (chronic obstructive pulmonary disease) ([place])   . Coronary artery disease   . Cough    thick phlegm  . Diabetes mellitus    Type II  . Diplopia 02/23/2013  . Diverticulosis   . Essential and other specified forms of tremor 02/23/2013  . Hx of Clostridium difficile infection   . Hyperlipidemia   . Hypertension   . Insomnia   . ITP (idiopathic thrombocytopenic purpura) 09/07/2012   Chronic ITP of adults versus medication-induced ITP.  Stable  . Myocardial infarction   . Nephrolithiasis 2012   ARF in 01/2011 due to obstructing nephrolithiasis  . Obesity   . OSA (obstructive sleep apnea)   . Polyneuropathy in diabetes(357.2) 02/23/2013  . Thrombocytopenia ([place])   . Tobacco abuse    50-pack-year consumption; quit in 12/2010  . Tubular adenoma of colon   . Ventral hernia     Past Surgical History:  Procedure Laterality Date  . ABDOMINAL AORTIC ANEURYSM REPAIR  2004  . CARDIAC CATHETERIZATION    . COLONOSCOPY  03/19/2010   Dr. Gala Romney -(poor prep) Anal papilla, rectal hyperplastic polyp, tubular adenoma removed splenic flexure, left-sided diverticula  . COLONOSCOPY  11/28/2004   RMR:  Diminutive rectal and left colon polyps as  described above, cold  biopsied/removed/  Left sided diverticula. The remainder of the colonic mucosa appeared normal.  . COLONOSCOPY   09/15/01   RMR: Multiple diminutive polyps destroyed with dermolysis as described above/ Multiple small polyps on stalks in the colon resected with snare cautery/ Scattered pan colonic diverticulum/ The remainder of the colonic mucosa appeared normal  . COLONOSCOPY N/A 05/01/2015   Procedure: COLONOSCOPY;  Surgeon: Daneil Dolin, MD;  [facility];  Service: Endoscopy;  Laterality: N/A;  1115   . CYSTECTOMY    . CYSTOSCOPY  04/2014  . CYSTOSTOMY W/ BLADDER BIOPSY    . FLEXIBLE SIGMOIDOSCOPY  2014   Dr. Olevia Perches: tubular adenoma, negative stool studies   . LUNG LOBECTOMY    . TONSILLECTOMY    . TRANSURETHRAL RESECTION OF PROSTATE    . VIDEO BRONCHOSCOPY WITH ENDOBRONCHIAL NAVIGATION N/A 10/10/2013   Procedure: VIDEO BRONCHOSCOPY WITH ENDOBRONCHIAL NAVIGATION;  Surgeon: Melrose Nakayama, MD;  Location: [place];  Service: Thoracic;  Laterality: N/A;  NO BLOOD THINNERS BUT PATIENT HAS ITP  . WEDGE RESECTION  04/2011   carcinoma of lung    There were no vitals filed for this visit.      Subjective Assessment - 07/17/16 1258    Subjective Pt states that he is feeling about 50% better since he started therapy.     How long can you sit comfortably? no difficulty   How long can you stand comfortably? was 15 minutes due to back pain continues to be the same.     How long can you walk comfortably? was 15 minutes due to back pain continues to be the same.   Walks with a cane    Currently in Pain? No/denies            [facility] PT Assessment - 07/17/16 0001      Assessment   Medical Diagnosis Multiple Falls    Referring Provider Mickie Hillier    Onset Date/Surgical Date --  Reports balance issues for about 2-3 years.    Hand Dominance Right   Next MD Visit January 2nd 2018   Prior [place] in October 2017 for this problem.      Precautions   Precautions Fall     Restrictions   Weight Bearing Restrictions No     Home Environment   Living Environment Private residence   Living Arrangements Spouse/significant other   Available Help at Discharge Family   Type of [place] to enter   Entrance Stairs-Number of Steps 2   Entrance Stairs-Rails Can reach both   [place] - 2 wheels;[place]ud[place] - single point  Walking stick     Prior Function   Level of Independence Independent;Independent with community mobility with  device;Other (comment)  wife does grocery shopping   Vocation Retired   Leisure Likes to read the paper each day, game shows at night     Posture/Postural Control   Posture/Postural Control Postural limitations   Postural Limitations Rounded Shoulders;Forward head;Increased thoracic kyphosis;Anterior pelvic tilt     AROM   Right Ankle Dorsiflexion 14   Left Ankle Dorsiflexion 10     Strength   Right Hip Flexion 5/5  was 4/5 at initial evaluation    Right Hip Extension 3-/5  was 2+/5    Right Hip External Rotation  5/5   Right Hip Internal Rotation 5/5  was 4+/5    Right Hip ABduction 4+/5  was  3+/5   Right Hip ADduction 4+/5  was 4+/5    Left Hip Flexion 5/5  was 4/5 at initial evaluationl   Left Hip Extension 3/5   Left Hip External Rotation 4/5   Left Hip Internal Rotation 5/5   Left Hip ABduction 3+/5  was tested seated so will not transtate.    Left Hip ADduction 4/5   Right Knee Flexion 5/5  was 4-/5 at inital evaluation    Right Knee Extension 5/5   Left Knee Flexion 4/5  was 3+/5   Left Knee Extension 5/5   Right/Left Ankle Right;Left   Right Ankle Dorsiflexion 5/5   Left Ankle Dorsiflexion 5/5     Bed Mobility   Bed Mobility Rolling Right;Rolling Left;Sit to Supine;Supine to Sit     Transfers   Transfers Sit to Stand   Sit to Stand 5: Supervision;Without upper extremity assist   Comments Moderate effort needed was maximal      Ambulation/Gait   Ambulation/Gait Yes   Ambulation/Gait Assistance 5: Supervision   Ambulation Distance (Feet) 100 Feet   Assistive device --  walking stick   Gait velocity .628  meter/second was .6     Balance   Balance Assessed Yes     Standardized Balance Assessment   Standardized Balance Assessment Berg Balance Test     Berg Balance Test   Sit to Stand Able to stand without using hands and stabilize independently   Standing Unsupported Able to stand safely 2 minutes   Sitting with Back Unsupported but Feet  Supported on Floor or Stool Able to sit safely and securely 2 minutes   Stand to Sit Sits safely with minimal use of hands   Transfers Able to transfer safely, minor use of hands   Standing Unsupported with Eyes Closed Able to stand 10 seconds safely   Standing Ubsupported with Feet Together Able to place feet together independently and stand 1 minute safely   From Standing, Reach Forward with Outstretched Arm Can reach forward >12 cm safely (5")   From Standing Position, Pick up Object from Floor Able to pick up shoe safely and easily   From Standing Position, Turn to Look Behind Over each Shoulder Looks behind one side only/other side shows less weight shift   Turn 360 Degrees Able to turn 360 degrees safely but slowly   Standing Unsupported, Alternately Place Feet on Step/Stool Able to stand independently and complete 8 steps >20 seconds  performs 8 in 24 seconds, with multiple LOB, self corrected   Standing Unsupported, One Foot in Front Able to plae foot ahead of the other independently and hold 30 seconds   Standing on One Leg Able to lift leg independently and hold equal to or more than 3 seconds   Total Score 48  Was 41     Functional Gait  Assessment   Gait assessed  Yes                     [facility] Adult PT Treatment/Exercise - 07/17/16 0001      Knee/Hip Exercises: Standing   SLS x5 each leg      Knee/Hip Exercises: Seated   Sit to Sand 10 reps     Knee/Hip Exercises: Supine   Bridges 10 reps                PT Education - 07/17/16 1337    Education provided Yes   Education Details update    Person(s) Educated Patient  Methods Explanation   Comprehension Verbalized understanding          PT Short Term Goals - 07/19/16 1330      PT SHORT TERM GOAL #1   Title After 4 weeks patient will demonstrate improved balance AEB BBT> 45/56.    Baseline BBT: 41/56   Status Achieved     PT SHORT TERM GOAL #2   Title After 4 weeks pt will demonstrate  improved Maximal gait speed AEB 10MWT > 1.15ms.    Baseline 10MWT at eval: 0.644m; 1.0591mon 11/20    Status Achieved     PT SHORT TERM GOAL #3   Title After 4 weeks patient will demonstrate improved functional strenght AEB ability to perform 5xSTS hands free in <30sec.    Baseline 5 in 28 seconds    Status Achieved           PT Long Term Goals - 06/2016-06-3031      PT LONG TERM GOAL #1   Title After 8 weeks patient will demonstrate improved balance AEB BBT> 48/56.    Status Not Met is at 48     PT LONG TERM GOAL #2   Title After 8 weeks pt will demonstrate improved maximal gait speed AEB 10MWT > 1.46m18m   Status Achieved     PT LONG TERM GOAL #3   Title After 8 weeks patient will demonstrate improved functional strenght AEB ability to perform 5xSTS hands free in <25sec.    Status Not Met               Plan - 12/212/30/20178    Clinical Impression Statement Mr. MoorFarotes that he is walking better and feels 50% better since starting therapy and would like to be discharged to a HEP.  Pt reassessed and therapist is in agreement with this.      Rehab Potential Good   PT Frequency 2x / week   PT Duration 8 weeks   PT Treatment/Interventions ADLs/Self Care Home Management;Moist Heat;Traction;Therapeutic activities;Therapeutic exercise;Balance training;Neuromuscular re-education;Patient/family education;Gait training;Dry needling;Manual techniques;Passive range of motion   PT Next Visit Plan Pt discharged to a HEP today    PT Home Exercise Plan Eval: Bridge, tandem stance 3x30sec, STS from elevated surface, seated marching.    Consulted and Agree with Plan of Care Patient      Patient will benefit from skilled therapeutic intervention in order to improve the following deficits and impairments:  Abnormal gait, Decreased coordination, Decreased range of motion, Difficulty walking, Obesity, Pain, Improper body mechanics, Postural dysfunction, Decreased activity tolerance,  Decreased balance, Decreased mobility, Decreased strength, Hypomobility  Visit Diagnosis: Repeated falls  Abnormal posture  Muscle weakness (generalized)       G-Codes - 12/22017-12-300    Functional Assessment Tool Used clinical judgement   Functional Limitation Changing and maintaining body position   Changing and Maintaining Body Position Goal Status (G89(H9622 least 40 percent but less than 60 percent impaired, limited or restricted   Changing and Maintaining Body Position Discharge Status (G89([id number] least 40 percent but less than 60 percent impaired, limited or restricted      Problem List Patient Active Problem List   Diagnosis Date Noted  . Subdural hematoma, post-traumatic (HCC)[place]/07/2015  . Subdural hematoma (HCC)[place]/07/2015  . Cough 10/03/2015  . Allergic rhinitis 10/03/2015  . Tremor, essential 08/27/2015  . History of colonic polyps   . Diverticulosis of colon without hemorrhage   . Essential and other specified  forms of tremor 02/23/2013  . Diplopia 02/23/2013  . Diabetic polyneuropathy ([place]) 02/23/2013  . Abnormality of gait 02/23/2013  . COPD exacerbation ([place]) 01/31/2013  . Fracture of right distal radius 12/14/2012  . Diabetes ([place]) 11/30/2012  . ITP (idiopathic thrombocytopenic purpura) 09/07/2012  . LLQ pain 11/13/2011  . Ventral hernia 10/28/2011  . AAA (abdominal aortic aneurysm) ([place])   . Tobacco abuse   . Diarrhea 10/22/2011  . Tubular adenoma of colon   . Obstructive sleep apnea 03/27/2011  . Fasting hyperglycemia 03/27/2011  . Nephrolithiasis 03/27/2011  . Bladder carcinoma ([place]) 03/27/2011  . COPD (chronic obstructive pulmonary disease) ([place]) 02/24/2011  . Adenocarcinoma of right lung ([place]) 02/24/2011  . Arteriosclerotic cardiovascular disease (ASCVD) 01/06/2011  . Hypertension 01/06/2011  . CKD (chronic kidney disease) stage 3, GFR 30-59 ml/min 01/06/2011   Rayetta Humphrey, PT CLT [phone] 07/17/2016, 4:39 PM  [place], [place], [address] Phone: [phone]   Fax:  [phone]  Name: Kyle [name] MRN: [id number] Date of Birth: 03-24-1934  PHYSICAL THERAPY DISCHARGE SUMMARY  Visits from Start of Care: 12  Current functional level related to goals / functional outcomes: As above   Remaining deficits: As above    Education / Equipment: HEP Plan: Patient agrees to discharge.  Patient goals were partially met. Patient is being discharged due to being pleased with the current functional level.  ?????        Rayetta Humphrey, [place] CLT [phone]

## 2016-07-18 ENCOUNTER — Ambulatory Visit: Payer: Medicare Other | Admitting: Family Medicine

## 2016-07-19 DIAGNOSIS — J449 Chronic obstructive pulmonary disease, unspecified: Secondary | ICD-10-CM | POA: Diagnosis not present

## 2016-07-22 ENCOUNTER — Other Ambulatory Visit (HOSPITAL_COMMUNITY)
Admission: RE | Admit: 2016-07-22 | Discharge: 2016-07-22 | Disposition: A | Discharge status: Home / Self Care | Payer: Medicare Other | Source: Ambulatory Visit | Attending: Internal Medicine | Admitting: Internal Medicine

## 2016-07-22 ENCOUNTER — Telehealth: Payer: Self-pay | Admitting: Family Medicine

## 2016-07-22 ENCOUNTER — Ambulatory Visit (HOSPITAL_COMMUNITY): Payer: Medicare Other | Admitting: Physical Therapy

## 2016-07-22 ENCOUNTER — Ambulatory Visit (INDEPENDENT_AMBULATORY_CARE_PROVIDER_SITE_OTHER): Payer: Medicare Other | Admitting: Family Medicine

## 2016-07-22 ENCOUNTER — Encounter: Payer: Self-pay | Admitting: Family Medicine

## 2016-07-22 VITALS — BP 128/82 | Ht 72.0 in | Wt 269.0 lb

## 2016-07-22 DIAGNOSIS — R2681 Unsteadiness on feet: Secondary | ICD-10-CM

## 2016-07-22 DIAGNOSIS — D631 Anemia in chronic kidney disease: Secondary | ICD-10-CM | POA: Insufficient documentation

## 2016-07-22 DIAGNOSIS — I10 Essential (primary) hypertension: Secondary | ICD-10-CM | POA: Insufficient documentation

## 2016-07-22 DIAGNOSIS — E119 Type 2 diabetes mellitus without complications: Secondary | ICD-10-CM | POA: Diagnosis not present

## 2016-07-22 DIAGNOSIS — G4733 Obstructive sleep apnea (adult) (pediatric): Secondary | ICD-10-CM | POA: Diagnosis not present

## 2016-07-22 DIAGNOSIS — N2581 Secondary hyperparathyroidism of renal origin: Secondary | ICD-10-CM | POA: Diagnosis not present

## 2016-07-22 DIAGNOSIS — N184 Chronic kidney disease, stage 4 (severe): Secondary | ICD-10-CM | POA: Diagnosis not present

## 2016-07-22 DIAGNOSIS — R42 Dizziness and giddiness: Secondary | ICD-10-CM

## 2016-07-22 LAB — URINALYSIS, MICROSCOPIC (REFLEX): SQUAMOUS EPITHELIAL / LPF: NONE SEEN

## 2016-07-22 LAB — COMPREHENSIVE METABOLIC PANEL
ALBUMIN: 3.9 g/dL (ref 3.5–5.0)
ALK PHOS: 58 U/L (ref 38–126)
ALT: 13 U/L — ABNORMAL LOW (ref 17–63)
AST: 11 U/L — AB (ref 15–41)
Anion gap: 7 (ref 5–15)
BILIRUBIN TOTAL: 1.1 mg/dL (ref 0.3–1.2)
BUN: 46 mg/dL — AB (ref 6–20)
CO2: 27 mmol/L (ref 22–32)
Calcium: 9.4 mg/dL (ref 8.9–10.3)
Chloride: 108 mmol/L (ref 101–111)
Creatinine, Ser: 2.62 mg/dL — ABNORMAL HIGH (ref 0.61–1.24)
GFR calc Af Amer: 25 mL/min — ABNORMAL LOW (ref >60)
GFR calc non Af Amer: 22 mL/min — ABNORMAL LOW (ref >60)
GLUCOSE: 120 mg/dL — AB (ref 65–99)
POTASSIUM: 4.6 mmol/L (ref 3.5–5.1)
Sodium: 142 mmol/L (ref 135–145)
TOTAL PROTEIN: 7.3 g/dL (ref 6.5–8.1)

## 2016-07-22 LAB — CBC WITH DIFFERENTIAL/PLATELET
BASOS ABS: 0 10*3/uL (ref 0.0–0.1)
Basophils Relative: 0 %
EOS PCT: 3 %
Eosinophils Absolute: 0.2 10*3/uL (ref 0.0–0.7)
HCT: 43.4 % (ref 39.0–52.0)
Hemoglobin: 14 g/dL (ref 13.0–17.0)
LYMPHS PCT: 18 %
Lymphs Abs: 1.3 10*3/uL (ref 0.7–4.0)
MCH: 31.4 pg (ref 26.0–34.0)
MCHC: 32.3 g/dL (ref 30.0–36.0)
MCV: 97.3 fL (ref 78.0–100.0)
MONO ABS: 0.7 10*3/uL (ref 0.1–1.0)
MONOS PCT: 10 %
Neutro Abs: 5 10*3/uL (ref 1.7–7.7)
Neutrophils Relative %: 68 %
PLATELETS: 95 10*3/uL — AB (ref 150–400)
RBC: 4.46 MIL/uL (ref 4.22–5.81)
RDW: 16.7 % — AB (ref 11.5–15.5)
WBC: 7.4 10*3/uL (ref 4.0–10.5)

## 2016-07-22 LAB — URINALYSIS, ROUTINE W REFLEX MICROSCOPIC
Bilirubin Urine: NEGATIVE
Glucose, UA: 250 mg/dL — AB
Ketones, ur: NEGATIVE mg/dL
LEUKOCYTES UA: NEGATIVE
NITRITE: NEGATIVE
PH: 5.5 (ref 5.0–8.0)
Protein, ur: 100 mg/dL — AB
SPECIFIC GRAVITY, URINE: 1.025 (ref 1.005–1.030)

## 2016-07-22 LAB — PROTEIN / CREATININE RATIO, URINE
Creatinine, Urine: 93.19 mg/dL
Protein Creatinine Ratio: 1.35 mg/mg{Cre} — ABNORMAL HIGH (ref 0.00–0.15)
TOTAL PROTEIN, URINE: 126 mg/dL

## 2016-07-22 LAB — POCT GLYCOSYLATED HEMOGLOBIN (HGB A1C): Hemoglobin A1C: 5.8

## 2016-07-22 LAB — FERRITIN: Ferritin: 304 ng/mL (ref 24–336)

## 2016-07-22 LAB — MAGNESIUM: Magnesium: 2.2 mg/dL (ref 1.7–2.4)

## 2016-07-22 LAB — URIC ACID: Uric Acid, Serum: 7.8 mg/dL — ABNORMAL HIGH (ref 4.4–7.6)

## 2016-07-22 LAB — PHOSPHORUS: Phosphorus: 3 mg/dL (ref 2.5–4.6)

## 2016-07-22 NOTE — Progress Notes (Deleted)
   Subjective:    Patient ID: Kyle [name], male    DOB: [birth year], 83 y.o.   MRN: [id number]  HPI    Review of Systems     Objective:   Physical Exam        Assessment & Plan:

## 2016-07-22 NOTE — Progress Notes (Signed)
   Subjective:    Patient ID: Kyle [name], male    DOB: 1933/10/14, 83 y.o.   MRN: [id number] Patient presents with a tremendous number of concerns. Hypertension  This is a chronic problem. The current episode started more than 1 year ago. Risk factors for coronary artery disease include diabetes mellitus, dyslipidemia, male gender, sedentary lifestyle and smoking/tobacco exposure. Treatments tried: metoprolol.   Patient claims compliance with diabetes medication. No obvious side effects. Reports no substantial low sugar spells. Most numbers are generally in good range when checked fasting. Generally does not miss a dose of medication. Watching diabetic diet closely  Patient continues to take lipid medication regularly. No obvious side effects from it. Generally does not miss a dose. Prior blood work results are reviewed with patient. Patient continues to work on fat intake in diet  Patient continues to use C Pap machine. States he definitely benefits from it. Uses it regularly. His current one is out of date and he needs a prescription for a new one.  Notes when he feels is a suture in his lip. Had repair before.  Family goes to great length describing the patient's unsteadiness. In fact took a fall yesterday. Patient just finished physical therapy within the last few weeks. Some clarifies and actually is a patient decided to stop it. Family would like to restart it.  Patient had elbow injury when he took a fall yesterday     Review of Systems No headache, no major weight loss or weight gain, no chest pain no back pain abdominal pain no change in bowel habits complete ROS otherwise negative     Objective:   Physical Exam Alert vitals stable, NAD. Blood pressure good on repeat. HEENT normal. Lungs clear. Heart regular rate and rhythm. Patient frail morbid obesity clearly present generally weak in appearance. Left elbow abrasion with surrounding hematoma good range of motion upper lip  old scar noted no obvious suture present       Assessment & Plan:  Impression 1 type 2 diabetes discussed good control maintain same #2 hypertension discuss good control maintain same #3 hyperlipidemia prior blood work reviewed discussed maintain same #4 lip injury not a suture discussed #5 elbow contusion with abrasion. #6 chronic obstructive sleep apnea with ongoing needs for C Pap machine. Uses regularly. [place]ftly benefits. Needs refill on this. #7 general weakness and frailty with family desire physical therapy discussed will reinitiate. A full 40 minutes spent discussing patient's myriad concerns. Many questions answered follow-up as scheduled.

## 2016-07-22 NOTE — Telephone Encounter (Signed)
Mariann Laster with [employer] called to let us know she was faxing a detailed order that will need Dr. [name] signature and date for patients CPAP (see form in green folder in yellow box in office)   Also today's OV note must document that "patient continues to use his CPAP and benefits from treatment and needs a new CPAP unit"  (this is required documentation for insurance to cover)  Please forward signed order to [place] and when OV note is finished it will be faxed with the order

## 2016-07-23 LAB — VITAMIN D 25 HYDROXY (VIT D DEFICIENCY, FRACTURES): VIT D 25 HYDROXY: 24.6 ng/mL — AB (ref 30.0–100.0)

## 2016-07-23 LAB — PTH, INTACT AND CALCIUM
Calcium, Total (PTH): 9.2 mg/dL (ref 8.6–10.2)
PTH: 44 pg/mL (ref 15–65)

## 2016-07-24 ENCOUNTER — Ambulatory Visit (HOSPITAL_COMMUNITY): Payer: Medicare Other

## 2016-07-28 DIAGNOSIS — L600 Ingrowing nail: Secondary | ICD-10-CM | POA: Diagnosis not present

## 2016-07-28 DIAGNOSIS — E1151 Type 2 diabetes mellitus with diabetic peripheral angiopathy without gangrene: Secondary | ICD-10-CM | POA: Diagnosis not present

## 2016-07-28 DIAGNOSIS — B351 Tinea unguium: Secondary | ICD-10-CM | POA: Diagnosis not present

## 2016-07-28 DIAGNOSIS — E1140 Type 2 diabetes mellitus with diabetic neuropathy, unspecified: Secondary | ICD-10-CM | POA: Diagnosis not present

## 2016-07-29 NOTE — Telephone Encounter (Signed)
Faxed signed order and OV note to [facility] @ [employer]

## 2016-07-29 NOTE — Telephone Encounter (Signed)
done

## 2016-07-31 DIAGNOSIS — H01012 Ulcerative blepharitis right lower eyelid: Secondary | ICD-10-CM | POA: Diagnosis not present

## 2016-07-31 DIAGNOSIS — H5034 Intermittent alternating exotropia: Secondary | ICD-10-CM | POA: Diagnosis not present

## 2016-07-31 DIAGNOSIS — H25013 Cortical age-related cataract, bilateral: Secondary | ICD-10-CM | POA: Diagnosis not present

## 2016-07-31 DIAGNOSIS — H01014 Ulcerative blepharitis left upper eyelid: Secondary | ICD-10-CM | POA: Diagnosis not present

## 2016-08-01 ENCOUNTER — Encounter (HOSPITAL_COMMUNITY): Payer: Medicare Other

## 2016-08-01 ENCOUNTER — Encounter (HOSPITAL_COMMUNITY): Payer: Medicare Other | Attending: Hematology & Oncology | Admitting: Hematology & Oncology

## 2016-08-01 ENCOUNTER — Encounter (HOSPITAL_COMMUNITY): Payer: Self-pay | Admitting: Hematology & Oncology

## 2016-08-01 VITALS — BP 133/64 | HR 73 | Temp 97.4°F | Resp 20 | Wt 271.6 lb

## 2016-08-01 DIAGNOSIS — C679 Malignant neoplasm of bladder, unspecified: Secondary | ICD-10-CM

## 2016-08-01 DIAGNOSIS — C3491 Malignant neoplasm of unspecified part of right bronchus or lung: Secondary | ICD-10-CM

## 2016-08-01 DIAGNOSIS — D696 Thrombocytopenia, unspecified: Secondary | ICD-10-CM | POA: Diagnosis not present

## 2016-08-01 LAB — CBC WITH DIFFERENTIAL/PLATELET
BASOS ABS: 0 10*3/uL (ref 0.0–0.1)
BASOS PCT: 0 %
EOS ABS: 0.2 10*3/uL (ref 0.0–0.7)
EOS PCT: 3 %
HCT: 39.1 % (ref 39.0–52.0)
Hemoglobin: 12.9 g/dL — ABNORMAL LOW (ref 13.0–17.0)
LYMPHS ABS: 1.7 10*3/uL (ref 0.7–4.0)
Lymphocytes Relative: 24 %
MCH: 31.9 pg (ref 26.0–34.0)
MCHC: 33 g/dL (ref 30.0–36.0)
MCV: 96.8 fL (ref 78.0–100.0)
Monocytes Absolute: 0.6 10*3/uL (ref 0.1–1.0)
Monocytes Relative: 8 %
Neutro Abs: 4.6 10*3/uL (ref 1.7–7.7)
Neutrophils Relative %: 65 %
PLATELETS: 89 10*3/uL — AB (ref 150–400)
RBC: 4.04 MIL/uL — AB (ref 4.22–5.81)
RDW: 17.1 % — ABNORMAL HIGH (ref 11.5–15.5)
WBC: 7.1 10*3/uL (ref 4.0–10.5)

## 2016-08-01 LAB — COMPREHENSIVE METABOLIC PANEL
ALBUMIN: 3.8 g/dL (ref 3.5–5.0)
ALT: 16 U/L — AB (ref 17–63)
AST: 15 U/L (ref 15–41)
Alkaline Phosphatase: 58 U/L (ref 38–126)
Anion gap: 5 (ref 5–15)
BUN: 40 mg/dL — ABNORMAL HIGH (ref 6–20)
CHLORIDE: 110 mmol/L (ref 101–111)
CO2: 27 mmol/L (ref 22–32)
CREATININE: 2.91 mg/dL — AB (ref 0.61–1.24)
Calcium: 8.8 mg/dL — ABNORMAL LOW (ref 8.9–10.3)
GFR calc non Af Amer: 19 mL/min — ABNORMAL LOW (ref >60)
GFR, EST AFRICAN AMERICAN: 22 mL/min — AB (ref >60)
Glucose, Bld: 151 mg/dL — ABNORMAL HIGH (ref 65–99)
Potassium: 4.5 mmol/L (ref 3.5–5.1)
SODIUM: 142 mmol/L (ref 135–145)
Total Bilirubin: 0.9 mg/dL (ref 0.3–1.2)
Total Protein: 6.8 g/dL (ref 6.5–8.1)

## 2016-08-01 NOTE — Patient Instructions (Signed)
[place] at [facility] Discharge Instructions  RECOMMENDATIONS MADE BY THE CONSULTANT AND ANY TEST RESULTS WILL BE SENT TO YOUR REFERRING PHYSICIAN.  You were seen by Dr. Whitney Muse today Follow up at clinic in 3 months We will schedule you for CT scan and lab work in June  Thank you for choosing [place] at [facility] to provide your oncology and hematology care.  To afford each patient quality time with our provider, please arrive at least 15 minutes before your scheduled appointment time.    If you have a lab appointment with the [facility] please come in thru the  Main Entrance and check in at the main information desk  You need to re-schedule your appointment should you arrive 10 or more minutes late.  We strive to give you quality time with our providers, and arriving late affects you and other patients whose appointments are after yours.  Also, if you no show three or more times for appointments you may be dismissed from the clinic at the providers discretion.     Again, thank you for choosing [facility].  Our hope is that these requests will decrease the amount of time that you wait before being seen by our physicians.       _____________________________________________________________  Should you have questions after your visit to [facility], please contact our office at [phone] between the hours of 8:30 a.m. and 4:30 p.m.  Voicemails left after 4:30 p.m. will not be returned until the following business day.  For prescription refill requests, have your pharmacy contact our office.       Resources For Cancer Patients and their Caregivers ? American Cancer Society: Can assist with transportation, wigs, general needs, runs Look Good Feel Better.        332-627-9282 ? Cancer Care: Provides financial assistance, online support groups, medication/co-pay assistance.  1-800-813-HOPE  716 462 2230) ? [place] Assists [place] cancer patients and their families through emotional , educational and financial support.  201-849-2943 ? [place] DSS Where to apply for food stamps, Medicaid and utility assistance. 314-268-5659 ? [facility]: Transportation to medical appointments. 810 041 6346 ? Social Security Administration: May apply for disability if have a Stage IV cancer. 347-424-5125 (626)165-2841 ? [employer], Disability and Transit Services: Assists with nutrition, care and transit needs. [place] Support Programs: '@10RELATIVEDAYS'$ @ > Cancer Support Group  2nd Tuesday of the month 1pm-2pm, Journey Room  > Creative Journey  3rd Tuesday of the month 1130am-1pm, Journey Room  > Look Good Feel Better  1st Wednesday of the month 10am-12 noon, Journey Room (Call [place] to register [phone])

## 2016-08-01 NOTE — Progress Notes (Signed)
[place] [facility] [place], MD [place] [address]  Stage IA moderately differentiated adenocarcinoma of the lung Thrombocytopenia, chronic Bladder Carcinoma    Adenocarcinoma of right lung ([place])   04/03/2011 Initial Diagnosis    INVASIVE MODERATELY DIFFERENTIATED ADENOCARCINOMA, 2.3 CM. (T1b, N0).  Clear margins, no LVI, no pleural involvement. 0/6 nodes.      04/03/2011 Surgery    Right lower lobe superior segmentectomy by Dr. Arlyce Dice      04/04/2011 Remission         03/07/2013 Imaging    CT of chest-  developing adenopathy in the subcarinal and azygo-esophageal recess stations, mild.  Findings are suspicious for recurrent or localized metastatic disease.       03/16/2013 Imaging    PET scan- No definite metabolically active pulmonary lesion and no mediastinal or hilar lymphadenopathy.        06/15/2013 Imaging    CT scan of chest- Ill-defined pulmonary density in RLL in the region of patient's prior surgery, less prominent than on prior CT obtained for PET-CT of 02/25/2013. This is most likely region ofscarring. Underlying tumor cannot be excluded      09/19/2013 Imaging    CT of chest- Progressive volume loss medially at the right lung base. Differential diagnostic considerations include mucus plugging, radiation therapy related findings, or progressive bronchogenic carcinoma      10/03/2013 Imaging    PET scan- Low level residual FDG uptake in the area of previous surgery with moderate atelectasis. I do not see any findings suspicious for recurrent or residual tumor.      10/10/2013 Procedure    Bronchcospy with biopsies and brushings by Dr. Roxan Hockey      10/10/2013 Pathology Results    RLL biopsies are negative for malignancy      12/24/2015 Imaging    CT chest- Posttreatment changes in the right lower lobe with associated volume loss, stable. No evidence of recurrent or metastatic disease.       Bladder carcinoma ([place])   03/27/2011 Initial Diagnosis    Bladder carcinoma       - 10/05/2013 Chemotherapy    Completed continuous bladder infusion chemotherapy at [facility] -- mitomycin C      09/05/2015 Imaging    CT C/A/P Stable noncontrast CT appearance of the prostate gland. Scarring and/or atelectasis in the right lower lobe, reduced prominence compared to prior exam. Right adrenal adenoma.  Stable nonspecific left adrenal nodule. Bilateral nonobstructive nephrolithiasis. Multiple exophytic fluid density lesions of the kidneys compatible with cysts. Mildly complex exophytic lesion of the right kidney lower pole posteriorly was not previously hypermetabolic on PET-CT.      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 Tressie Stalker at [facility]: Surveillance per NCCN guidelines.  INTERVAL HISTORY: Kyle [name] 83 y.o. male returns for  regular  visit for followup of Stage I a (T1 B., N0, M0) moderately differentiated adenocarcinoma the lung, 2.3 cm in size with surgery on 04/03/2011. Margins were clear, no LV I was seen,  and there is no pleural involvement. 6 lymph nodes were all negative getting stage IA disease.  AND Thrombocytopenia thus far consistent with chronic ITP of adults versus drug-induced mild thrombocytopenia. He is on too many medications to withdraw them completely. There is no evidence on imaging of splenomegaly or cirrhosis.   He had carcinoma in situ of the bladder in January 2015 underwent mitomycin C, which he tolerated well. He had a positive FISH test so he had cysto bilateral retrogrades, which were negative on 02/14/2014. I had a suspicious cytology and FISH test subsequently underwent  cystoscopy and bladder biopsies and washings on 07/05/14. He is a renal pelvic washings had some slight atypia were not problematic. However, the bladder washing was very suspicious positive and the FISH test that was positive. Again, there were no obvious areas seen in the bladder, but obviously the cytology is localized to the bladder. He subsequently underwent six weekly treatments with Valstar and he tolerated that well. He had BCG-Osis and mycobacterial epididymitis in the past. He completed his last Valstar on 10/02/14. He underwent cystoscopy on 11/06/14. There was some diffuse inflammation, but the cytology was okay. His cystoscopy was on 02/05/2015. It was okay, but there were some atypical cytology, possibly from inflammation. His cystoscopy on 05/21/15 revealed diffuse inflammation and he had a FISH test that was positive. 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 That was on 08/28/15. His creatinine was1.93. He has subsequently seen Dr. Thera Flake. We do not feel he is a good surgical candidate and she is considering a combination of chemotherapy and radiation therapy. There was no actual visible tumor, so I think a re-resection of the area is probably not indicated and he is certainly not a good surgical candidate. He underwent chemotherapy with Capecitabine and XRT and is doing well. He comes in for FU.Patient followed by his medical oncologist in [place], has had a urinary tract infection recently, but overall is doing well.  Procedure Details  The risks, benefits, complications, treatment options, and expected outcomes were discussed with the patient. The patient concurred with the proposed plan, giving informed consent.  Cystoscopy was performed today under local anesthesia, using sterile technique. The patient was placed in the supine position, prepped with Betadinex3, and  draped in the usual sterile fashion. A flexible cystoscope was used to inspect both the urethra and bladder.  Findings: Anterior urethra: normal without strictures and without scarring.  Some inflammation had been on the chronic tissue in the prostatic urethra but no flank tumors were seen. Bladder: Mild trabeculation, without lesions. Ureteral orifice(s) was/were seen bilateral. Ureteral orifice(s) bilateral were in the normal location and bilateral ureteral orifices were effluxing clear urine.  Complications: None; patient tolerated the procedure well.  Disposition: To home after 30 minute observation.  Condition: stable  Attending Attestation: I performed the procedure.  Return in in 4 month(s) for cystoscopy.  Electronically signed by: Carin Hock, MD 07/11/2016 10:10 AM   He went to therapy recently to help his gait and prevent him from falling again. He has a walker, but does not want to use it yet. He is happy with his cane. He was doing well but then he got a UTI. He will start therapy again soon. He saw Dr. Thera Flake for his UTI.   He last went to get his bladder cancer checked 2 weeks ago. They did a cystoscopy and said everything was normal.  Note above  Pt has been eating well. He stays active, but likes to be home before dark. He goes to the country store every day to talk to the men. He sleeps about 12-14 hours a day.   His wife says that his kidney function was lower. He is being checked every month with labs. He should be drinking a lot of water, but has been drinking more sweet tea and coke than water.   He had a sharp pain that ran down his left arm yesterday. His fingers went numb. It happened twice. Denies SOB or chest pain. He takes aspirin every day.   Denies bowel problems. Notes that overall he feels fairly well.    Past Medical History:  Diagnosis Date  . AAA (abdominal aortic aneurysm) ([place]) 2004   s/p repair 2004; 4.3 cm infrarenal in 05/2011  .  Abnormality of gait 02/23/2013  . Adenocarcinoma of right lung ([place]) 02/24/2011   Ct A/P 2012:  2cm lung mass RLL PET 4097:  Hypermetabolic RLL mass, no other hypermetabolic areas. TTNA 02/2011:  Adenocarcinoma, markers c/w lung origin Right lower lobe superior segmentectomy. 04/01/2011 Dr. Arlyce Dice   . Adenocarcinoma, lung ([place]) 01/2011   transthoracic FNA; resection of the superior segment of the RLL in 03/2011; negative nodes; no chemotherapy nor radiation planned  . Arm fracture    right arm  . Arteriosclerotic cardiovascular disease (ASCVD) 1973, 12/2010   S/P NSTEMI secondary to distal RCA/PL lesion, tx medically.  EF of  55%-60% per  echo.  . Benign prostatic hypertrophy    s/p transurethral resection of the prostate  . Bilateral renal masses    Cystic, more prominent on CT in 12/2010 than 2007; followed by Dr. Rosana Hoes  . Bladder cancer [facility]) 1996   Transurethral resection of the bladder + chemotherapy/BCG as premed  . Cataract   . Chronic kidney disease    Creatinine 1.4 on discharge 12/20/2100; proteinuria; normal renal ultrasound in 2010; recent creatinines of 1.7-2.; Bilateral cystic renal masses by CT in 2011  . COPD (chronic obstructive pulmonary disease) ([place])   . Coronary artery disease   . Cough    thick phlegm  . Diabetes mellitus    Type II  . Diplopia 02/23/2013  . Diverticulosis   . Essential and other specified forms of tremor 02/23/2013  . Hx of Clostridium difficile infection   . Hyperlipidemia   . Hypertension   . Insomnia   . ITP (idiopathic thrombocytopenic purpura) 09/07/2012   Chronic ITP of adults versus medication-induced ITP.  Stable  . Myocardial infarction   . Nephrolithiasis 2012   ARF in 01/2011 due to obstructing nephrolithiasis  . Obesity   . OSA (obstructive sleep apnea)   . Polyneuropathy in diabetes(357.2) 02/23/2013  . Thrombocytopenia ([place])   . Tobacco abuse    50-pack-year consumption; quit in 12/2010  . Tubular adenoma of colon   . Ventral hernia      has Arteriosclerotic cardiovascular disease (ASCVD); Hypertension; CKD (chronic kidney disease) stage 3, GFR 30-59 ml/min; COPD (chronic obstructive pulmonary disease) ([place]); Adenocarcinoma of right lung ([place]); Obstructive sleep apnea; Fasting hyperglycemia; Nephrolithiasis; Bladder carcinoma ([place]); Diarrhea; Tubular adenoma of colon; Ventral hernia; AAA (abdominal aortic aneurysm) ([place]); Tobacco abuse; LLQ pain; ITP (idiopathic thrombocytopenic purpura); Diabetes ([place]); Fracture of right distal radius; COPD exacerbation ([place]); Essential and other specified forms of tremor; Diplopia; Diabetic polyneuropathy ([place]); Abnormality of gait; History of colonic polyps; Diverticulosis of colon without hemorrhage; Tremor, essential; Cough; Allergic rhinitis; Subdural hematoma, post-traumatic (  [place]); and Subdural hematoma ([place]) on his problem list.     is allergic to codeine and etodolac.  Mr. Rochford had no medications administered during this visit.  Past Surgical History:  Procedure Laterality Date  . ABDOMINAL AORTIC ANEURYSM REPAIR  2004  . CARDIAC CATHETERIZATION    . COLONOSCOPY  03/19/2010   Dr. Gala Romney -(poor prep) Anal papilla, rectal hyperplastic polyp, tubular adenoma removed splenic flexure, left-sided diverticula  . COLONOSCOPY  11/28/2004   RMR:  Diminutive rectal and left colon polyps as described above, cold  biopsied/removed/  Left sided diverticula. The remainder of the colonic mucosa appeared normal.  . COLONOSCOPY   09/15/01   RMR: Multiple diminutive polyps destroyed with dermolysis as described above/ Multiple small polyps on stalks in the colon resected with snare cautery/ Scattered pan colonic diverticulum/ The remainder of the colonic mucosa appeared normal  . COLONOSCOPY N/A 05/01/2015   Procedure: COLONOSCOPY;  Surgeon: Daneil Dolin, MD;  Location: [facility];  Service: Endoscopy;  Laterality: N/A;  1115  . CYSTECTOMY    . CYSTOSCOPY  04/2014  . CYSTOSTOMY W/ BLADDER BIOPSY     . FLEXIBLE SIGMOIDOSCOPY  2014   Dr. Olevia Perches: tubular adenoma, negative stool studies   . LUNG LOBECTOMY    . TONSILLECTOMY    . TRANSURETHRAL RESECTION OF PROSTATE    . VIDEO BRONCHOSCOPY WITH ENDOBRONCHIAL NAVIGATION N/A 10/10/2013   Procedure: VIDEO BRONCHOSCOPY WITH ENDOBRONCHIAL NAVIGATION;  Surgeon: Melrose Nakayama, MD;  Location: [facility];  Service: Thoracic;  Laterality: N/A;  NO BLOOD THINNERS BUT PATIENT HAS ITP  . WEDGE RESECTION  04/2011   carcinoma of lung    Review of Systems  Constitutional: Negative.   HENT: Negative.   Eyes: Negative.   Respiratory: Negative.  Negative for shortness of breath.   Cardiovascular: Negative for chest pain.       Pain down left arm  Gastrointestinal: Negative.  Negative for blood in stool, constipation, diarrhea and melena.  Genitourinary: Negative.   Musculoskeletal: Positive for falls.  Skin: Negative.   Neurological: Positive for tingling (left fingers).  Endo/Heme/Allergies: Negative.   Psychiatric/Behavioral: Negative.  The patient does not have insomnia (sleeps 12-14 hrs a day).   All other systems reviewed and are negative.  14 point review of systems was performed and is negative except as detailed under history of present illness and above   PHYSICAL EXAMINATION   Vitals with BMI 08/01/2016  Height   Weight 271 lbs 10 oz  BMI   Systolic 998  Diastolic 64  Pulse 73  Respirations 20    ECOG PERFORMANCE STATUS: 2 - Symptomatic, <50% confined to bed  Physical Exam  Constitutional: He is oriented to person, place, and time and well-developed, well-nourished, and in no distress.  Pt was able to get on exam table with assistance.   HENT:  Head: Normocephalic and atraumatic.  Mouth/Throat: Oropharynx is clear and moist. No oropharyngeal exudate.  Eyes: Conjunctivae and EOM are normal. Pupils are equal, round, and reactive to light. No scleral icterus.  Neck: Normal range of motion. Neck supple.  Cardiovascular:  Normal rate, regular rhythm and normal heart sounds.   Pulmonary/Chest: Effort normal and breath sounds normal. No respiratory distress.  Abdominal: Soft. Bowel sounds are normal. He exhibits no distension and no mass. There is no tenderness. There is no rebound and no guarding.  Musculoskeletal: Normal range of motion.  Lymphadenopathy:    He has no cervical adenopathy.  Neurological: He is alert  and oriented to person, place, and time. Gait normal.  Skin: Skin is warm and dry.  Psychiatric: Mood, memory, affect and judgment normal.  Nursing note and vitals reviewed.  LABORATORY DATA: I have reviewed the data as listed. Results for Kyle [name], Kyle [name] (MRN [id number]) as of 08/01/2016 10:17  Ref. Range 08/01/2016 13:17  Sodium Latest Ref Range: 135 - 145 mmol/L 142  Potassium Latest Ref Range: 3.5 - 5.1 mmol/L 4.5  Chloride Latest Ref Range: 101 - 111 mmol/L 110  CO2 Latest Ref Range: 22 - 32 mmol/L 27  Glucose Latest Ref Range: 65 - 99 mg/dL 151 (H)  BUN Latest Ref Range: 6 - 20 mg/dL 40 (H)  Creatinine Latest Ref Range: 0.61 - 1.24 mg/dL 2.91 (H)  Calcium Latest Ref Range: 8.9 - 10.3 mg/dL 8.8 (L)  Anion gap Latest Ref Range: 5 - 15  5  Alkaline Phosphatase Latest Ref Range: 38 - 126 U/L 58  Albumin Latest Ref Range: 3.5 - 5.0 g/dL 3.8  AST Latest Ref Range: 15 - 41 U/L 15  ALT Latest Ref Range: 17 - 63 U/L 16 (L)  Total Protein Latest Ref Range: 6.5 - 8.1 g/dL 6.8  Total Bilirubin Latest Ref Range: 0.3 - 1.2 mg/dL 0.9  EGFR (African American) Latest Ref Range: >60 mL/min 22 (L)  EGFR (Non-African Amer.) Latest Ref Range: >60 mL/min 19 (L)  WBC Latest Ref Range: 4.0 - 10.5 K/uL 7.1  RBC Latest Ref Range: 4.22 - 5.81 MIL/uL 4.04 (L)  Hemoglobin Latest Ref Range: 13.0 - 17.0 g/dL 12.9 (L)  HCT Latest Ref Range: 39.0 - 52.0 % 39.1  MCV Latest Ref Range: 78.0 - 100.0 fL 96.8  MCH Latest Ref Range: 26.0 - 34.0 pg 31.9  MCHC Latest Ref Range: 30.0 - 36.0 g/dL 33.0  RDW Latest Ref Range:  11.5 - 15.5 % 17.1 (H)  Platelets Latest Ref Range: 150 - 400 K/uL 89 (L)  Neutrophils Latest Units: % 65  Lymphocytes Latest Units: % 24  Monocytes Relative Latest Units: % 8  Eosinophil Latest Units: % 3  Basophil Latest Units: % 0  NEUT# Latest Ref Range: 1.7 - 7.7 K/uL 4.6  Lymphocyte # Latest Ref Range: 0.7 - 4.0 K/uL 1.7  Monocyte # Latest Ref Range: 0.1 - 1.0 K/uL 0.6  Eosinophils Absolute Latest Ref Range: 0.0 - 0.7 K/uL 0.2  Basophils Absolute Latest Ref Range: 0.0 - 0.1 K/uL 0.0   RADIOLOGY: I have reviewed the images below and agree with the reported results Study Result   CLINICAL DATA:  Speech disturbance 2 days ago with weakness.  EXAM: CT HEAD WITHOUT CONTRAST  TECHNIQUE: Contiguous axial images were obtained from the base of the skull through the vertex without intravenous contrast.  COMPARISON:  05/07/2016  FINDINGS: Brain: Age related generalized atrophy. No sign of acute infarction, mass lesion, hemorrhage, hydrocephalus or extra-axial collection.  Vascular: There is atherosclerotic calcification of the major vessels at the base of the brain.  Skull: Normal  Sinuses/Orbits: Clear/normal  Other: None significant  IMPRESSION: No acute finding by CT.  Age related atrophy.   Electronically Signed   By: Nelson Chimes M.D.   On: 06/24/2016 15:31  Study Result   CLINICAL DATA:  Cough.  Weakness.  EXAM: CHEST  2 VIEW  COMPARISON:  CT 12/24/2015 .  Chest x-ray 05/30/2016.  FINDINGS: Mediastinum and hilar structures are normal. Right perihilar atelectasis/mass/post treatment changes again noted. No change. Right base atelectasis again noted. Post treatment changes could  also present this fashion . No pleural effusion or pneumothorax. Heart size normal. No acute bony abnormality .  IMPRESSION: Right perihilar atelectasis/ mass/post treatment changes again noted. Right base atelectasis again noted. No interim  change.   Electronically Signed   By: Marcello Moores  Register   On: 06/24/2016 14:48     PATHOLOGY:    10/10/2013  Diagnosis 1. Lung, biopsy, RLL - BENIGN LUNG TISSUE, SEE COMMENT. - NEGATIVE FOR ATYPIA OR MALIGNANCY. 2. Lung, biopsy, RLL - BENIGN LUNG TISSUE, SEE COMMENT. - NEGATIVE FOR ATYPIA OR MALIGNANCY. Microscopic Comment 1. Multiple biopsies demonstrate non neoplastic lung with reactive bronchial respiratory-type epithelium overlying thickened basement membrane. Within the subepithelial tissue, there is prominent lymphoplasmacytic inflammation and edema. There are no features of epithelial dysplasia or malignancy present. The case was reviewed with Dr. Gari Crown who concurs. 2. Multiple biopsies demonstrate non neoplastic lung with reactive bronchial respiratory-type epithelium overlying thickened basement. Within the subepithelial tissue, there is chronic lymphoplasmacytic inflammation, fibroelastotic stromal change, and edema. There is incidental oxyntic metaplasia of benign salivary-type glands. There are no features of epithelial dysplasia or malignancy present. (CRR:gt, 10/12/13) Mali RUND DO Pathologist, Electronic Signature (Case signed 10/12/2013)    ASSESSMENT:  1. Stage I a (T1 B., N0, M0) moderately differentiated adenocarcinoma the lung, 2.3 cm in size with surgery on 04/03/2011. Margins were clear, no LV I was seen, and there is no pleural involvement. 6 lymph nodes were all negative getting stage IA disease.  2. Thrombocytopenia thus far consistent with chronic ITP of adults versus drug-induced mild thrombocytopenia. He is on too many medications to withdraw them completely. There is no evidence on his last CT scan of splenomegaly or cirrhosis. Stable.  3. Obesity  4. Intermittent diarrhea treated by GI  5. Bladder cancer followed by Dr. Rosana Hoes, s/p concurrent chemoxrt at [facility] with Dr. Tressie Stalker 6. MI x2 the last in May 2012  7. Abdominal hernia  secondary to abdominal aortic aneurysm surgery in 2008  8. Double vision in the past which has not been a recent issue  9. History of skin cancer  10. Chronic renal disease, grade 3, followed by nephrologist  11. S/P bronchoscopy by Dr. Roxan Hockey on 10/10/2013 with biopsies and they are negative. 12. Fall/subdural hematoma  Patient Active Problem List   Diagnosis Date Noted  . Subdural hematoma, post-traumatic ([place]) 03/21/2016  . Subdural hematoma ([place]) 03/21/2016  . Cough 10/03/2015  . Allergic rhinitis 10/03/2015  . Tremor, essential 08/27/2015  . History of colonic polyps   . Diverticulosis of colon without hemorrhage   . Essential and other specified forms of tremor 02/23/2013  . Diplopia 02/23/2013  . Diabetic polyneuropathy ([place]) 02/23/2013  . Abnormality of gait 02/23/2013  . COPD exacerbation ([place]) 01/31/2013  . Fracture of right distal radius 12/14/2012  . Diabetes ([place]) 11/30/2012  . ITP (idiopathic thrombocytopenic purpura) 09/07/2012  . LLQ pain 11/13/2011  . Ventral hernia 10/28/2011  . AAA (abdominal aortic aneurysm) ([place])   . Tobacco abuse   . Diarrhea 10/22/2011  . Tubular adenoma of colon   . Obstructive sleep apnea 03/27/2011  . Fasting hyperglycemia 03/27/2011  . Nephrolithiasis 03/27/2011  . Bladder carcinoma ([place]) 03/27/2011  . COPD (chronic obstructive pulmonary disease) ([place]) 02/24/2011  . Adenocarcinoma of right lung ([place]) 02/24/2011  . Arteriosclerotic cardiovascular disease (ASCVD) 01/06/2011  . Hypertension 01/06/2011  . CKD (chronic kidney disease) stage 3, GFR 30-59 ml/min 01/06/2011     PLAN:   I reviewed the CT scans with the  patient.  We will continue with ongoing CT surveillance as recommended in accordance with the NCCN guidelines. He will need repeat imaging in regards to his NSCLC next summer. I have ordered these.  NCCN guidelines for Non-Small Cell Lung Cancer Surveillance in the setting of clinical/radiographic remission are as  follows (4.2017):  A. Stage I-II (primary treatment included surgery +/- chemotherapy):   1. H+P and chest CT +/- contrast every 6 months for 2-3 years, then H+P and low-dose non-contrast-enhanced chest CT annually   Since being in the [facility] he has finally discontinued smoking. This was encouraged.   In regards to his chronic thrombocytopenia, counts are stable.  Since our last visit, the patient fell and was admitted to the hospital 03/21/2016 to 03/24/2016 with a subdural hematoma. He has made a fairly good recovery.  Cystoscopy was just performed at [facility] at the end of December. He will continue to follow there in regards to his bladder cancer.   If the pain down his left arm happens again, he will call Dr. Wolfgang Phoenix, or report to the ED  CT scans in June.   He will return for a follow up in 3 months.   Orders Placed This Encounter  Procedures  . CT Abdomen Pelvis Wo Contrast    Standing Status:   Future    Standing Expiration Date:   08/01/2017    Order Specific Question:   Reason for Exam (SYMPTOM  OR DIAGNOSIS REQUIRED)    Answer:   follow-up NSCLC< bladder carcinoma    Order Specific Question:   Preferred imaging location?    Answer:   [facility]  . CT Chest Wo Contrast    Standing Status:   Future    Standing Expiration Date:   08/01/2017    Order Specific Question:   Reason for Exam (SYMPTOM  OR DIAGNOSIS REQUIRED)    Answer:   follow-up NSCLC< bladder carcinoma    Order Specific Question:   Preferred imaging location?    Answer:   [facility]    All questions were answered. The patient knows to call the clinic with any problems, questions or concerns. We can certainly see the patient much sooner if necessary.  This document serves as a record of services personally performed by Ancil Linsey, MD. It was created on her behalf by Martinique Casey, a trained medical scribe. The creation of this record is based on the scribe's personal observations and the  provider's statements to them. This document has been checked and approved by the attending provider.  I have reviewed the above documentation for accuracy and completeness, and I agree with the above.  This note was electronically signed. Molli Hazard, MD  08/01/2016

## 2016-08-03 DIAGNOSIS — N184 Chronic kidney disease, stage 4 (severe): Secondary | ICD-10-CM | POA: Diagnosis not present

## 2016-08-03 DIAGNOSIS — Z87440 Personal history of urinary (tract) infections: Secondary | ICD-10-CM | POA: Diagnosis not present

## 2016-08-03 DIAGNOSIS — R399 Unspecified symptoms and signs involving the genitourinary system: Secondary | ICD-10-CM | POA: Diagnosis not present

## 2016-08-03 DIAGNOSIS — R350 Frequency of micturition: Secondary | ICD-10-CM | POA: Diagnosis not present

## 2016-08-03 DIAGNOSIS — R81 Glycosuria: Secondary | ICD-10-CM | POA: Diagnosis not present

## 2016-08-05 ENCOUNTER — Ambulatory Visit (HOSPITAL_COMMUNITY): Payer: Medicare Other | Admitting: Physical Therapy

## 2016-08-11 ENCOUNTER — Ambulatory Visit (HOSPITAL_COMMUNITY): Payer: Medicare Other | Admitting: Physical Therapy

## 2016-08-11 ENCOUNTER — Telehealth: Payer: Self-pay | Admitting: Family Medicine

## 2016-08-11 DIAGNOSIS — G4733 Obstructive sleep apnea (adult) (pediatric): Secondary | ICD-10-CM | POA: Diagnosis not present

## 2016-08-11 NOTE — Telephone Encounter (Signed)
Wife called wanting to see if they could get a prescription faxed to [facility] for a "SO CLEAN" unit to help sanitize his cpap machine. I don't think rx is needed for them to buy it, but is needed for reimbursement. You can call wife back at [phone] if you have any questions. Thanks

## 2016-08-11 NOTE — Telephone Encounter (Signed)
Script faxed to pharm. Pt notified.

## 2016-08-11 NOTE — Telephone Encounter (Signed)
wis 

## 2016-08-13 ENCOUNTER — Ambulatory Visit (HOSPITAL_COMMUNITY): Payer: Medicare Other | Attending: Family Medicine | Admitting: Physical Therapy

## 2016-08-13 DIAGNOSIS — M6281 Muscle weakness (generalized): Secondary | ICD-10-CM | POA: Diagnosis not present

## 2016-08-13 DIAGNOSIS — R2689 Other abnormalities of gait and mobility: Secondary | ICD-10-CM

## 2016-08-13 DIAGNOSIS — R296 Repeated falls: Secondary | ICD-10-CM | POA: Insufficient documentation

## 2016-08-13 DIAGNOSIS — R293 Abnormal posture: Secondary | ICD-10-CM | POA: Diagnosis not present

## 2016-08-13 NOTE — Therapy (Signed)
[place], [place], [address] Phone: [phone]   Fax:  [phone]  Physical Therapy Evaluation  Patient Details  Name: Kyle [name] MRN: [id number] Date of Birth: August 07, 1933 Referring Provider: Mikey Kirschner, MD  Encounter Date: 08/13/2016      PT End of Session - 08/13/16 1740    Visit Number 1   Number of Visits 5   Date for PT Re-Evaluation 09/10/16   Authorization Type UHC- Medicare     Authorization Time Period 08/13/16 to 09/10/16   Authorization - Visit Number 1   Authorization - Number of Visits 5   PT Start Time 0814   PT Stop Time 4818   PT Time Calculation (min) 43 min   Equipment Utilized During Treatment Gait belt   Activity Tolerance Patient tolerated treatment well;Patient limited by fatigue;No increased pain   Behavior During Therapy WFL for tasks assessed/performed      Past Medical History:  Diagnosis Date  . AAA (abdominal aortic aneurysm) ([place]) 2004   s/p repair 2004; 4.3 cm infrarenal in 05/2011  . Abnormality of gait 02/23/2013  . Adenocarcinoma of right lung ([place]) 02/24/2011   Ct A/P 2012:  2cm lung mass RLL PET 5631:  Hypermetabolic RLL mass, no other hypermetabolic areas. TTNA 02/2011:  Adenocarcinoma, markers c/w lung origin Right lower lobe superior segmentectomy. 04/01/2011 Dr. Arlyce Dice   . Adenocarcinoma, lung ([place]) 01/2011   transthoracic FNA; resection of the superior segment of the RLL in 03/2011; negative nodes; no chemotherapy nor radiation planned  . Arm fracture    right arm  . Arteriosclerotic cardiovascular disease (ASCVD) 1973, 12/2010   S/P NSTEMI secondary to distal RCA/PL lesion, tx medically.  EF of  55%-60% per  echo.  . Benign prostatic hypertrophy    s/p transurethral resection of the prostate  . Bilateral renal masses    Cystic, more prominent on CT in 12/2010 than 2007; followed by Dr. Rosana Hoes  . Bladder cancer [facility] 1996   Transurethral resection of the bladder  + chemotherapy/BCG as premed  . Cataract   . Chronic kidney disease    Creatinine 1.4 on discharge 12/20/2100; proteinuria; normal renal ultrasound in 2010; recent creatinines of 1.7-2.; Bilateral cystic renal masses by CT in 2011  . COPD (chronic obstructive pulmonary disease) ([place])   . Coronary artery disease   . Cough    thick phlegm  . Diabetes mellitus    Type II  . Diplopia 02/23/2013  . Diverticulosis   . Essential and other specified forms of tremor 02/23/2013  . Hx of Clostridium difficile infection   . Hyperlipidemia   . Hypertension   . Insomnia   . ITP (idiopathic thrombocytopenic purpura) 09/07/2012   Chronic ITP of adults versus medication-induced ITP.  Stable  . Myocardial infarction   . Nephrolithiasis 2012   ARF in 01/2011 due to obstructing nephrolithiasis  . Obesity   . OSA (obstructive sleep apnea)   . Polyneuropathy in diabetes(357.2) 02/23/2013  . Thrombocytopenia ([place])   . Tobacco abuse    50-pack-year consumption; quit in 12/2010  . Tubular adenoma of colon   . Ventral hernia     Past Surgical History:  Procedure Laterality Date  . ABDOMINAL AORTIC ANEURYSM REPAIR  2004  . CARDIAC CATHETERIZATION    . COLONOSCOPY  03/19/2010   Dr. Gala Romney -(poor prep) Anal papilla, rectal hyperplastic polyp, tubular adenoma removed splenic flexure, left-sided diverticula  . COLONOSCOPY  11/28/2004   RMR:  Diminutive rectal and left colon polyps as described above, cold  biopsied/removed/  Left sided diverticula. The remainder of the colonic mucosa appeared normal.  . COLONOSCOPY   09/15/01   RMR: Multiple diminutive polyps destroyed with dermolysis as described above/ Multiple small polyps on stalks in the colon resected with snare cautery/ Scattered pan colonic diverticulum/ The remainder of the colonic mucosa appeared normal  . COLONOSCOPY N/A 05/01/2015   Procedure: COLONOSCOPY;  Surgeon: Daneil Dolin, MD;  [facility];  Service: Endoscopy;  Laterality: N/A;  1115   . CYSTECTOMY    . CYSTOSCOPY  04/2014  . CYSTOSTOMY W/ BLADDER BIOPSY    . FLEXIBLE SIGMOIDOSCOPY  2014   Dr. Olevia Perches: tubular adenoma, negative stool studies   . LUNG LOBECTOMY    . TONSILLECTOMY    . TRANSURETHRAL RESECTION OF PROSTATE    . VIDEO BRONCHOSCOPY WITH ENDOBRONCHIAL NAVIGATION N/A 10/10/2013   Procedure: VIDEO BRONCHOSCOPY WITH ENDOBRONCHIAL NAVIGATION;  Surgeon: Melrose Nakayama, MD;  Location: [facility];  Service: Thoracic;  Laterality: N/A;  NO BLOOD THINNERS BUT PATIENT HAS ITP  . WEDGE RESECTION  04/2011   carcinoma of lung    There were no vitals filed for this visit.       Subjective Assessment - 08/13/16 1601    Subjective Pt states that he had received PT for several weeks around December and he was making progress. He decided to d/c from PT so that he could continue with his HEP independently. He fell 2x, ~2-3 weeks ago when he was reaching forward to reach for something on the ground. He also fell while trying to turn his radio up in the middle of the night without turning his light on. He states he bruised his Rt shoulder but other than that he has been fine.   How long can you sit comfortably? unlimited    How long can you stand comfortably? ~15 minutes    How long can you walk comfortably? ~15 minutes    Patient Stated Goals improve balance    Currently in Pain? No/denies            [facility] PT Assessment - 08/13/16 0001      Assessment   Medical Diagnosis Multiple Falls    Referring Provider Mikey Kirschner, MD   Onset Date/Surgical Date --  Reports balance issues for about 2-3 years.    Hand Dominance Right   Next MD Visit 09/21/16   Prior [place] in October 2017 for this problem.      Precautions   Precautions Fall     Restrictions   Weight Bearing Restrictions No     Balance Screen   Has the patient fallen in the past 6 months Yes   How many times? 2  1 bending over to grab something, 1 in the dark     [place] residence   Living Arrangements Spouse/significant other   Available Help at Discharge Family   Type of [place] to enter   Entrance Stairs-Number of Steps 2   Entrance Stairs-Rails Can reach both   [place] - single point;Walker - 2 wheels  Walking stick     Prior Function   Level of Independence Independent;Independent with community mobility with device;Other (comment)  wife does grocery shopping   Vocation Retired   Leisure Likes to read the paper each day, game shows at night  Posture/Postural Control   Posture/Postural Control Postural limitations     AROM   Right Ankle Dorsiflexion --   Left Ankle Dorsiflexion --     Strength   Right Hip Flexion 5/5   Right Hip Extension 3-/5   Right Hip External Rotation  --   Right Hip Internal Rotation --   Right Hip ABduction 4+/5   Right Hip ADduction 4+/5   Left Hip Flexion 5/5   Left Hip Extension 3-/5   Left Hip External Rotation --   Left Hip Internal Rotation --   Left Hip ABduction 3/5   Left Hip ADduction 4/5   Right Knee Flexion 5/5   Right Knee Extension 5/5   Left Knee Flexion 4/5   Left Knee Extension 5/5   Right Ankle Dorsiflexion 5/5   Left Ankle Dorsiflexion 5/5     Bed Mobility   Bed Mobility --     Transfers   Transfers Sit to Stand   Sit to Stand 5: Supervision;Without upper extremity assist   Five time sit to stand comments  27.3, UE on thighs      Ambulation/Gait   Gait Comments TUG: 17 sec, no AD     Balance   Balance Assessed Yes     Standardized Balance Assessment   Standardized Balance Assessment Berg Balance Test     Berg Balance Test   Sit to Stand Able to stand without using hands and stabilize independently   Standing Unsupported Able to stand safely 2 minutes   Sitting with Back Unsupported but Feet Supported on Floor or Stool Able to sit safely and securely 2 minutes   Stand to Sit Sits safely with  minimal use of hands   Transfers Able to transfer safely, minor use of hands   Standing Unsupported with Eyes Closed Able to stand 10 seconds safely   Standing Ubsupported with Feet Together Able to place feet together independently and stand 1 minute safely   From Standing, Reach Forward with Outstretched Arm Can reach forward >12 cm safely (5")   From Standing Position, Pick up Object from Floor Able to pick up shoe safely and easily   From Standing Position, Turn to Look Behind Over each Shoulder Looks behind from both sides and weight shifts well   Turn 360 Degrees Able to turn 360 degrees safely but slowly   Standing Unsupported, Alternately Place Feet on Step/Stool Able to stand independently and safely and complete 8 steps in 20 seconds  performs 8 in 24 seconds, with multiple LOB, self corrected   Standing Unsupported, One Foot in Front Able to plae foot ahead of the other independently and hold 30 seconds   Standing on One Leg Tries to lift leg/unable to hold 3 seconds but remains standing independently   Total Score 49     Functional Gait  Assessment   Gait assessed  --                   [facility] Adult PT Treatment/Exercise - 08/13/16 0001      Transfers   Comments --     Ambulation/Gait                         Posture/Postural Control   Postural Limitations Rounded Shoulders;Forward head;Increased thoracic kyphosis                PT Education - 08/13/16 1738    Education provided Yes   Education Details discussed  eval findings/POC; discussed benefits of PT in addressing balance deficits and improving safety with activity; ways to improve safety around the home with proper footwear, removing floor rugs/clutter, turning on light or using nightlights to improve vision at night; initiated HEP   Person(s) Educated Patient;Child(ren)   Methods Explanation;Handout   Comprehension Verbalized understanding          PT Short Term Goals - 08/13/16 1627       PT SHORT TERM GOAL #1   Title Pt will demo consistency and independence with his HEP to improve strength and balance.    Baseline --   Time 2   Period Weeks   Status New     PT SHORT TERM GOAL #2   Title Pt will demonstrate understanding of the importance of balance prevention evident by his ability to verbalize atleast 2 ways to improve the layout of his house to decrease risk of falling   Baseline --   Time 2   Status New     PT SHORT TERM GOAL #3   Title --   Baseline --   Status --           PT Long Term Goals - 08/13/16 1754      PT LONG TERM GOAL #1   Title Pt will perform 5x sit to stand in less than 18 sec with minimal use of his UE, to demonstrate improved functional strength and power.   Time 4   Period Weeks   Status New     PT LONG TERM GOAL #2   Title Pt will perform the TUG in less than 14 sec with LRAD, to indicate he is at a decreased risk of falling in the community.   Time 4   Period Weeks   Status New     PT LONG TERM GOAL #3   Title Pt will make atleast a 3 point improvement on his Berg balance test, to demonstrate a clinically importance improvement in balance.   Time 4   Period Weeks   Status New               Plan - 08/13/16 1741    Clinical Impression Statement Mr. Rosenfield is returning to OPPT with 2 recent falls since his d/c from PT 3-4 weeks ago. He recently self-discharged from PT to continue with his HEP at home. He presents today with minimal changes in strength, mobility and balance from his most recent reassessment. His Berg balance score, although improved 1 point from last assessment, still places him in a category for higher risk of falling, and his performance on the TUG and 5x sit to stand are less than expected for his age. He has maintained his function well on his own, but he would've benefited from finishing his PT through his original POC. He would benefit from a couple more visits of skilled PT intervention to address  balance and strength impairments as well as educate him on ways to improve safety awareness at home to decrease his risk of falling and injuring himself. HEP was provided and eval findings/POC were reviewed with the pt and his son, and both verbalized understanding and agreement.    Rehab Potential Good   PT Frequency Other (comment)  2x/week for 1 week, decreasing to 1x/week for remaining 3 weeks    PT Duration 4 weeks   PT Treatment/Interventions ADLs/Self Care Home Management;Moist Heat;Therapeutic activities;Therapeutic exercise;Balance training;Neuromuscular re-education;Patient/family education;Gait training;Dry needling;Manual techniques;Passive range of motion;Stair training;Functional mobility training  PT Next Visit Plan review adjustments to home for improved safety (Rugs, lights at night, etc.); review HEP: sit to stand no UE, SLS; standing balance: reaches, EC, foam support   PT Home Exercise Plan sit to stand no UE support 10-15 reps, SLS 3x30 sec each   Consulted and Agree with Plan of Care Patient;Family member/caregiver   Family Member Consulted Son       Patient will benefit from skilled therapeutic intervention in order to improve the following deficits and impairments:  Abnormal gait, Decreased coordination, Decreased range of motion, Difficulty walking, Obesity, Pain, Improper body mechanics, Postural dysfunction, Decreased activity tolerance, Decreased balance, Decreased mobility, Decreased strength, Hypomobility  Visit Diagnosis: Other abnormalities of gait and mobility  Muscle weakness (generalized)  Repeated falls  Abnormal posture      G-Codes - 2016/08/15 1758    Functional Assessment Tool Used Clinical judgement based on assessment of strength, TUG, 5x sit to stand and Berg balance testing   Functional Limitation Mobility: Walking and moving around   Mobility: Walking and Moving Around Current Status [phone]) At least 40 percent but less than 60 percent  impaired, limited or restricted   Mobility: Walking and Moving Around Goal Status [phone]) At least 20 percent but less than 40 percent impaired, limited or restricted       Problem List Patient Active Problem List   Diagnosis Date Noted  . Stage 4 chronic kidney disease ([place]) 06/30/2016  . Subdural hematoma, post-traumatic ([place]) 03/21/2016  . Subdural hematoma ([place]) 03/21/2016  . Malignant neoplasm of trigone of urinary bladder ([place]) 02/12/2016  . Bilirubinemia 12/12/2015  . Thrombocytopenia ([place]) 11/13/2015  . Cough 10/03/2015  . Allergic rhinitis 10/03/2015  . Secondary hyperparathyroidism ([place]) 09/06/2015  . Tremor, essential 08/27/2015  . History of colonic polyps   . Diverticulosis of colon without hemorrhage   . Malignant neoplasm of lateral wall of urinary bladder ([place]) 01/12/2014  . CIS (carcinoma in situ of bladder) 08/22/2013  . Static tremor 02/23/2013  . Diplopia 02/23/2013  . Diabetic polyneuropathy ([place]) 02/23/2013  . Abnormality of gait 02/23/2013  . COPD exacerbation ([place]) 01/31/2013  . Fracture of right distal radius 12/14/2012  . Diabetes mellitus ([place]) 11/30/2012  . Idiopathic thrombocytopenic purpura ([place]) 09/07/2012  . Depression 07/28/2012  . Benign prostate hyperplasia 06/10/2012  . LLQ pain 11/13/2011  . Hernia of anterior abdominal wall 10/28/2011  . AAA (abdominal aortic aneurysm) ([place])   . Tobacco abuse   . Diarrhea 10/22/2011  . Tubular adenoma of colon   . Bladder neck obstruction 06/05/2011  . Obstructive sleep apnea on CPAP 03/27/2011  . Fasting hyperglycemia 03/27/2011  . Recurrent nephrolithiasis 03/27/2011  . Carcinoma of bladder ([place]) 03/27/2011  . COPD (chronic obstructive pulmonary disease) ([place]) 02/24/2011  . Adenocarcinoma of right lung ([place]) 02/24/2011  . CAD (coronary artery disease) 01/06/2011  . Hypertension 01/06/2011  . Chronic kidney disease, stage III (moderate) 01/06/2011     6:03 PM,08/15/16 Elly Modena PT,  DPT [facility] [address] [place], [place], [address] Phone: [phone]   Fax:  [phone]  Name: Kyle [name] MRN: [id number] Date of Birth: 1934/06/17

## 2016-08-19 ENCOUNTER — Other Ambulatory Visit: Payer: Self-pay | Admitting: Licensed Clinical Social Worker

## 2016-08-19 DIAGNOSIS — J449 Chronic obstructive pulmonary disease, unspecified: Secondary | ICD-10-CM | POA: Diagnosis not present

## 2016-08-19 NOTE — Patient Outreach (Signed)
Assessment:  CSW spoke via phone with client. CSW verified client identity. CSW and client spoke of client needs. Client sees Dr. Baltazar Apo as primary care doctor.  Client receives support from his spouse, Kyle [name]. [name] transports client to and from client's scheduled medical appointments. Client also has some support from his son. Client uses a walker to assist him in ambulation. Client also has a cane to use in assisting him with ambulation.Client  has had a history of falls.  Client has not had a recent fall. He said also that he has now completed all in home physical therapy sessions for client. CSW and client spoke of client care plan. CSW encouraged client to participate in all scheduled client Outpatient Physical Therapy sessions for client in next 30 days. Client feels that Outpatient Physical Therapy sessions for him are helpful. CSW encouraged that client or Kyle [name] call CSW at [phone] as needed to discuss social work needs of client.  Client is receiving [facility] nursing support with RN Kyle [name].       Plan:  Client to participate in all scheudled client Outpatient Physical therapy sessions for client in next 30 days.  CSW to call client in 4 weeks to assess client needs at that time.  Kyle [name].Kyle [name] MSW, LCSW Licensed Clinical Social Worker [facility] Care Management [phone]

## 2016-08-20 ENCOUNTER — Ambulatory Visit (HOSPITAL_COMMUNITY): Payer: Medicare Other

## 2016-08-20 ENCOUNTER — Other Ambulatory Visit: Payer: Self-pay | Admitting: *Deleted

## 2016-08-20 DIAGNOSIS — R2689 Other abnormalities of gait and mobility: Secondary | ICD-10-CM | POA: Diagnosis not present

## 2016-08-20 DIAGNOSIS — R296 Repeated falls: Secondary | ICD-10-CM

## 2016-08-20 DIAGNOSIS — R293 Abnormal posture: Secondary | ICD-10-CM | POA: Diagnosis not present

## 2016-08-20 DIAGNOSIS — M6281 Muscle weakness (generalized): Secondary | ICD-10-CM

## 2016-08-20 NOTE — Therapy (Signed)
[place], [place], [address] Phone: [phone]   Fax:  [phone]  Physical Therapy Treatment  Patient Details  Name: Kyle [name] MRN: [id number] Date of Birth: 07-05-1934 Referring Provider: Mikey Kirschner, MD  Encounter Date: 08/20/2016      PT End of Session - 08/20/16 1527    Visit Number 2   Number of Visits 5   Date for PT Re-Evaluation 09/10/16   Authorization Type UHC- Medicare     Authorization Time Period 08/13/16 to 09/10/16   Authorization - Visit Number 2   Authorization - Number of Visits 5   PT Start Time 3154   PT Stop Time 1558   PT Time Calculation (min) 35 min   Equipment Utilized During Treatment Gait belt   Activity Tolerance Patient tolerated treatment well;Patient limited by fatigue;No increased pain   Behavior During Therapy WFL for tasks assessed/performed      Past Medical History:  Diagnosis Date  . AAA (abdominal aortic aneurysm) ([place]) 2004   s/p repair 2004; 4.3 cm infrarenal in 05/2011  . Abnormality of gait 02/23/2013  . Adenocarcinoma of right lung ([place]) 02/24/2011   Ct A/P 2012:  2cm lung mass RLL PET 0086:  Hypermetabolic RLL mass, no other hypermetabolic areas. TTNA 02/2011:  Adenocarcinoma, markers c/w lung origin Right lower lobe superior segmentectomy. 04/01/2011 Dr. Arlyce Dice   . Adenocarcinoma, lung ([place]) 01/2011   transthoracic FNA; resection of the superior segment of the RLL in 03/2011; negative nodes; no chemotherapy nor radiation planned  . Arm fracture    right arm  . Arteriosclerotic cardiovascular disease (ASCVD) 1973, 12/2010   S/P NSTEMI secondary to distal RCA/PL lesion, tx medically.  EF of  55%-60% per  echo.  . Benign prostatic hypertrophy    s/p transurethral resection of the prostate  . Bilateral renal masses    Cystic, more prominent on CT in 12/2010 than 2007; followed by Dr. Rosana Hoes  . Bladder cancer [facility]) 1996   Transurethral resection of the bladder +  chemotherapy/BCG as premed  . Cataract   . Chronic kidney disease    Creatinine 1.4 on discharge 12/20/2100; proteinuria; normal renal ultrasound in 2010; recent creatinines of 1.7-2.; Bilateral cystic renal masses by CT in 2011  . COPD (chronic obstructive pulmonary disease) ([place])   . Coronary artery disease   . Cough    thick phlegm  . Diabetes mellitus    Type II  . Diplopia 02/23/2013  . Diverticulosis   . Essential and other specified forms of tremor 02/23/2013  . Hx of Clostridium difficile infection   . Hyperlipidemia   . Hypertension   . Insomnia   . ITP (idiopathic thrombocytopenic purpura) 09/07/2012   Chronic ITP of adults versus medication-induced ITP.  Stable  . Myocardial infarction   . Nephrolithiasis 2012   ARF in 01/2011 due to obstructing nephrolithiasis  . Obesity   . OSA (obstructive sleep apnea)   . Polyneuropathy in diabetes(357.2) 02/23/2013  . Thrombocytopenia ([place])   . Tobacco abuse    50-pack-year consumption; quit in 12/2010  . Tubular adenoma of colon   . Ventral hernia     Past Surgical History:  Procedure Laterality Date  . ABDOMINAL AORTIC ANEURYSM REPAIR  2004  . CARDIAC CATHETERIZATION    . COLONOSCOPY  03/19/2010   Dr. Gala Romney -(poor prep) Anal papilla, rectal hyperplastic polyp, tubular adenoma removed splenic flexure, left-sided diverticula  . COLONOSCOPY  11/28/2004   RMR:  Diminutive rectal and left colon polyps as described above, cold  biopsied/removed/  Left sided diverticula. The remainder of the colonic mucosa appeared normal.  . COLONOSCOPY   09/15/01   RMR: Multiple diminutive polyps destroyed with dermolysis as described above/ Multiple small polyps on stalks in the colon resected with snare cautery/ Scattered pan colonic diverticulum/ The remainder of the colonic mucosa appeared normal  . COLONOSCOPY N/A 05/01/2015   Procedure: COLONOSCOPY;  Surgeon: Daneil Dolin, MD;  [facility];  Service: Endoscopy;  Laterality: N/A;  1115   . CYSTECTOMY    . CYSTOSCOPY  04/2014  . CYSTOSTOMY W/ BLADDER BIOPSY    . FLEXIBLE SIGMOIDOSCOPY  2014   Dr. Olevia Perches: tubular adenoma, negative stool studies   . LUNG LOBECTOMY    . TONSILLECTOMY    . TRANSURETHRAL RESECTION OF PROSTATE    . VIDEO BRONCHOSCOPY WITH ENDOBRONCHIAL NAVIGATION N/A 10/10/2013   Procedure: VIDEO BRONCHOSCOPY WITH ENDOBRONCHIAL NAVIGATION;  Surgeon: Melrose Nakayama, MD;  Location: [facility];  Service: Thoracic;  Laterality: N/A;  NO BLOOD THINNERS BUT PATIENT HAS ITP  . WEDGE RESECTION  04/2011   carcinoma of lung    There were no vitals filed for this visit.      Subjective Assessment - 08/20/16 1520    Subjective Pt stated he has had no falls since last session.  Reports no pain currently.   Pertinent History Multiple falls recently, including one last month that resulted in head trauma, hospital admission, and subsequent short term rehab admission. He estimates that he has fallen about 5 times in the last 6 months, twice missing a chair twice, and tumbling forward to pick up a newspaper.   Patient Stated Goals improve balance    Currently in Pain? No/denies              [facility] Adult PT Treatment/Exercise - 08/20/16 0001      Knee/Hip Exercises: Seated   Sit to Sand 2 sets;5 reps;without UE support             Balance Exercises - 08/20/16 1546      Balance Exercises: Standing   Standing Eyes Opened Narrow base of support (BOS);Foam/compliant surface   Tandem Stance Eyes open;3 reps;30 secs;Intermittent upper extremity support   SLS 3 reps;Intermittent upper extremity support;30 secs  1 finger A; 2 sets with 1 foot on 12in step   [facility]one Rotation Foam/compliant surface;Right turn;Left turn           PT Education - 08/20/16 1534    Education provided Yes   Education Details reviewed goals, compliance and assured correct form/technique with HEP and copy of eval given to pt.  Reviewed adjustments to home safety (remove rugs/clutter,  light nights in hallway, use of appropriate AD/shoes   Person(s) Educated Patient   Methods Explanation;Demonstration;Handout   Comprehension Verbalized understanding;Returned demonstration;Need further instruction          PT Short Term Goals - 08/13/16 1627      PT SHORT TERM GOAL #1   Title Pt will demo consistency and independence with his HEP to improve strength and balance.    Baseline --   Time 2   Period Weeks   Status New     PT SHORT TERM GOAL #2   Title Pt will demonstrate understanding of the importance of balance prevention evident by his ability to verbalize atleast 2 ways to improve the layout of his house to decrease risk of falling   Baseline --  Time 2   Status New     PT SHORT TERM GOAL #3   Title --   Baseline --   Status --           PT Long Term Goals - 08/13/16 1754      PT LONG TERM GOAL #1   Title Pt will perform 5x sit to stand in less than 18 sec with minimal use of his UE, to demonstrate improved functional strength and power.   Time 4   Period Weeks   Status New     PT LONG TERM GOAL #2   Title Pt will perform the TUG in less than 14 sec with LRAD, to indicate he is at a decreased risk of falling in the community.   Time 4   Period Weeks   Status New     PT LONG TERM GOAL #3   Title Pt will make atleast a 3 point improvement on his Berg balance test, to demonstrate a clinically importance improvement in balance.   Time 4   Period Weeks   Status New               Plan - 08/20/16 1759    Clinical Impression Statement Reviewed goals, assured compliance and correct technique wiht HEP and copy of eval given to pt.  Session focus on education for safety within home to reduce risk of fall and balance training with min A.  Pt stated he had done a lot of walking in store prior session and was limited by fatigue requiring seated rest breaks.  Min A required for safety to reduce risk of fall.  No reports of pain through session.      Rehab Potential Good   PT Frequency --  2x/week for 1st week then decrease 1/week for 3 weeks   PT Duration 4 weeks   PT Treatment/Interventions ADLs/Self Care Home Management;Moist Heat;Therapeutic activities;Therapeutic exercise;Balance training;Neuromuscular re-education;Patient/family education;Gait training;Dry needling;Manual techniques;Passive range of motion;Stair training;Functional mobility training   PT Next Visit Plan Progress balance:  sit to stand no UE, SLS; standing balance: reaches, EC, foam support      Patient will benefit from skilled therapeutic intervention in order to improve the following deficits and impairments:  Abnormal gait, Decreased coordination, Decreased range of motion, Difficulty walking, Obesity, Pain, Improper body mechanics, Postural dysfunction, Decreased activity tolerance, Decreased balance, Decreased mobility, Decreased strength, Hypomobility  Visit Diagnosis: Other abnormalities of gait and mobility  Muscle weakness (generalized)  Repeated falls  Abnormal posture     Problem List Patient Active Problem List   Diagnosis Date Noted  . Stage 4 chronic kidney disease ([place]) 06/30/2016  . Subdural hematoma, post-traumatic ([place]) 03/21/2016  . Subdural hematoma ([place]) 03/21/2016  . Malignant neoplasm of trigone of urinary bladder ([place]) 02/12/2016  . Bilirubinemia 12/12/2015  . Thrombocytopenia ([place]) 11/13/2015  . Cough 10/03/2015  . Allergic rhinitis 10/03/2015  . Secondary hyperparathyroidism ([place]) 09/06/2015  . Tremor, essential 08/27/2015  . History of colonic polyps   . Diverticulosis of colon without hemorrhage   . Malignant neoplasm of lateral wall of urinary bladder ([place]) 01/12/2014  . CIS (carcinoma in situ of bladder) 08/22/2013  . Static tremor 02/23/2013  . Diplopia 02/23/2013  . Diabetic polyneuropathy ([place]) 02/23/2013  . Abnormality of gait 02/23/2013  . COPD exacerbation ([place]) 01/31/2013  . Fracture of right distal radius  12/14/2012  . Diabetes mellitus ([place]) 11/30/2012  . Idiopathic thrombocytopenic purpura ([place]) 09/07/2012  . Depression 07/28/2012  . Benign  prostate hyperplasia 06/10/2012  . LLQ pain 11/13/2011  . Hernia of anterior abdominal wall 10/28/2011  . AAA (abdominal aortic aneurysm) ([place])   . Tobacco abuse   . Diarrhea 10/22/2011  . Tubular adenoma of colon   . Bladder neck obstruction 06/05/2011  . Obstructive sleep apnea on CPAP 03/27/2011  . Fasting hyperglycemia 03/27/2011  . Recurrent nephrolithiasis 03/27/2011  . Carcinoma of bladder ([place]) 03/27/2011  . COPD (chronic obstructive pulmonary disease) ([place]) 02/24/2011  . Adenocarcinoma of right lung ([place]) 02/24/2011  . CAD (coronary artery disease) 01/06/2011  . Hypertension 01/06/2011  . Chronic kidney disease, stage III (moderate) 01/06/2011   Ihor Austin, LPTA; CBIS [phone]  Aldona Lento 08/20/2016, 6:05 PM  [place], [place], [address] Phone: [phone]   Fax:  [phone]  Name: Kyle [name] MRN: [id number] Date of Birth: 1933/12/26

## 2016-08-20 NOTE — Patient Outreach (Signed)
[facility]) Care Management  08/20/2016  Kyle [name] November 13, 1933 [id number]  Unable to reach [name] by phone today or leave message.   Plan: I will reach out to [name] again on Friday.    [facility]  [phone]

## 2016-08-21 ENCOUNTER — Other Ambulatory Visit: Payer: Self-pay | Admitting: *Deleted

## 2016-08-21 ENCOUNTER — Encounter (HOSPITAL_COMMUNITY): Payer: Self-pay | Admitting: Hematology & Oncology

## 2016-08-21 NOTE — Patient Outreach (Signed)
[name]) Care Management  08/21/2016  Kyle [name] October 29, 1933 [id number]  Kyle [name] avery pleasant 83year old patient who has been followed off and onby [facility] for greater than 3years. He has multiple chronic medical problems including HTN, DMII, CAD, COPD, and CKD. Kyle [name] fell earlier in November sustaining a subdural hematoma and required a 3 day hospital stay. He was transitioned to the nursing facility for rehab and recovery prior to discharging to home.   Kyle [name] lives at home with his wife Kyle [name] who is his primary caregiver. Kyle [name] followed up with Dr. Vertell Limber and had repeat CT of his head. His blood pressure medications were adjusted during his hospitalization and he has been monitoring his BP at home. Kyle [name] daily aspirin was discontinued and he is on fall precautions. Kyle [name] has also seen his ENT in follow up because of a nasal bone fracture sustained at the time of his fall. He has been going to outpatient PT for vestibular therapy.   On 06/24/16, Kyle [name] was having some slurred speech at home. His wife had him transported to the ED and it was noted that he had been recently taking two '20mg'$  Celexa daily instead of the prescribed one tablet daily. This has been corrected by his wife. Kyle [name] is taking Celexa as prescribed now and has had no further episodes of slurred speech.   During Kyle [name] ED evaluation, it was also noted that he had UTI. We initially planned to transition Kyle [name] to telephonic case management in December but he has had ongoing urinary concerns and UTI's.   Today, Kyle [name] called stating he'd received correspondence from the maker of his Spiriva inhaler and had received a call from his pharmacy, both letting him know that "Spiriva can cause urinary problems". He is concerned about causality and asked me if I could help him get in touch with his urologist to get his input.   I reached out to the  office of Dr. Tresa Endo, urology/[facility] [phone]) and spoke with Dr. Rosana Hoes' nurse asking her to pass along Kyle [name] question about Spiriva and urinary symptoms, calling Kyle [name] with recommendations.   Plan: I will follow up with Kyle [name] by phone no later than next week to ensure that he understands Dr. Rosana Hoes' recommendations and to follow up on his general progress and condition.    [facility]  [phone]

## 2016-08-22 ENCOUNTER — Ambulatory Visit (HOSPITAL_COMMUNITY): Payer: Medicare Other | Attending: Family Medicine

## 2016-08-22 ENCOUNTER — Encounter (HOSPITAL_COMMUNITY): Payer: Self-pay

## 2016-08-22 ENCOUNTER — Ambulatory Visit: Payer: Self-pay | Admitting: *Deleted

## 2016-08-22 VITALS — BP 146/91 | HR 76

## 2016-08-22 DIAGNOSIS — R293 Abnormal posture: Secondary | ICD-10-CM

## 2016-08-22 DIAGNOSIS — R2689 Other abnormalities of gait and mobility: Secondary | ICD-10-CM

## 2016-08-22 DIAGNOSIS — R8290 Unspecified abnormal findings in urine: Secondary | ICD-10-CM | POA: Diagnosis not present

## 2016-08-22 DIAGNOSIS — M6281 Muscle weakness (generalized): Secondary | ICD-10-CM | POA: Insufficient documentation

## 2016-08-22 DIAGNOSIS — R296 Repeated falls: Secondary | ICD-10-CM | POA: Insufficient documentation

## 2016-08-22 DIAGNOSIS — N183 Chronic kidney disease, stage 3 (moderate): Secondary | ICD-10-CM | POA: Diagnosis not present

## 2016-08-22 DIAGNOSIS — N3020 Other chronic cystitis without hematuria: Secondary | ICD-10-CM | POA: Diagnosis not present

## 2016-08-22 DIAGNOSIS — C672 Malignant neoplasm of lateral wall of bladder: Secondary | ICD-10-CM | POA: Diagnosis not present

## 2016-08-22 NOTE — Therapy (Signed)
[place], [place], [address] Phone: [phone]   Fax:  [phone]  Physical Therapy Treatment  Patient Details  Name: Kyle [name] MRN: [id number] Date of Birth: 12/30/1933 Referring Provider: Mikey Kirschner, MD  Encounter Date: 08/22/2016      PT End of Session - 08/22/16 1525    Visit Number 3   Number of Visits 5   Date for PT Re-Evaluation 09/10/16   Authorization Type UHC- Medicare     Authorization Time Period 08/13/16 to 09/10/16   Authorization - Visit Number 3   Authorization - Number of Visits 5   PT Start Time 5449   PT Stop Time 1602   PT Time Calculation (min) 43 min   Equipment Utilized During Treatment Gait belt   Activity Tolerance Patient tolerated treatment well;Patient limited by fatigue;No increased pain   Behavior During Therapy WFL for tasks assessed/performed      Past Medical History:  Diagnosis Date  . AAA (abdominal aortic aneurysm) ([place]) 2004   s/p repair 2004; 4.3 cm infrarenal in 05/2011  . Abnormality of gait 02/23/2013  . Adenocarcinoma of right lung ([place]) 02/24/2011   Ct A/P 2012:  2cm lung mass RLL PET 2010:  Hypermetabolic RLL mass, no other hypermetabolic areas. TTNA 02/2011:  Adenocarcinoma, markers c/w lung origin Right lower lobe superior segmentectomy. 04/01/2011 Dr. Arlyce Dice   . Adenocarcinoma, lung ([place]) 01/2011   transthoracic FNA; resection of the superior segment of the RLL in 03/2011; negative nodes; no chemotherapy nor radiation planned  . Arm fracture    right arm  . Arteriosclerotic cardiovascular disease (ASCVD) 1973, 12/2010   S/P NSTEMI secondary to distal RCA/PL lesion, tx medically.  EF of  55%-60% per  echo.  . Benign prostatic hypertrophy    s/p transurethral resection of the prostate  . Bilateral renal masses    Cystic, more prominent on CT in 12/2010 than 2007; followed by Dr. Rosana Hoes  . Bladder cancer [facility]) 1996   Transurethral resection of the bladder +  chemotherapy/BCG as premed  . Cataract   . Chronic kidney disease    Creatinine 1.4 on discharge 12/20/2100; proteinuria; normal renal ultrasound in 2010; recent creatinines of 1.7-2.; Bilateral cystic renal masses by CT in 2011  . COPD (chronic obstructive pulmonary disease) ([place])   . Coronary artery disease   . Cough    thick phlegm  . Diabetes mellitus    Type II  . Diplopia 02/23/2013  . Diverticulosis   . Essential and other specified forms of tremor 02/23/2013  . Hx of Clostridium difficile infection   . Hyperlipidemia   . Hypertension   . Insomnia   . ITP (idiopathic thrombocytopenic purpura) 09/07/2012   Chronic ITP of adults versus medication-induced ITP.  Stable  . Myocardial infarction   . Nephrolithiasis 2012   ARF in 01/2011 due to obstructing nephrolithiasis  . Obesity   . OSA (obstructive sleep apnea)   . Polyneuropathy in diabetes(357.2) 02/23/2013  . Thrombocytopenia ([place])   . Tobacco abuse    50-pack-year consumption; quit in 12/2010  . Tubular adenoma of colon   . Ventral hernia     Past Surgical History:  Procedure Laterality Date  . ABDOMINAL AORTIC ANEURYSM REPAIR  2004  . CARDIAC CATHETERIZATION    . COLONOSCOPY  03/19/2010   Dr. Gala Romney -(poor prep) Anal papilla, rectal hyperplastic polyp, tubular adenoma removed splenic flexure, left-sided diverticula  . COLONOSCOPY  11/28/2004   RMR:  Diminutive rectal and left colon polyps as described above, cold  biopsied/removed/  Left sided diverticula. The remainder of the colonic mucosa appeared normal.  . COLONOSCOPY   09/15/01   RMR: Multiple diminutive polyps destroyed with dermolysis as described above/ Multiple small polyps on stalks in the colon resected with snare cautery/ Scattered pan colonic diverticulum/ The remainder of the colonic mucosa appeared normal  . COLONOSCOPY N/A 05/01/2015   Procedure: COLONOSCOPY;  Surgeon: Daneil Dolin, MD;  [facility];  Service: Endoscopy;  Laterality: N/A;  1115   . CYSTECTOMY    . CYSTOSCOPY  04/2014  . CYSTOSTOMY W/ BLADDER BIOPSY    . FLEXIBLE SIGMOIDOSCOPY  2014   Dr. Olevia Perches: tubular adenoma, negative stool studies   . LUNG LOBECTOMY    . TONSILLECTOMY    . TRANSURETHRAL RESECTION OF PROSTATE    . VIDEO BRONCHOSCOPY WITH ENDOBRONCHIAL NAVIGATION N/A 10/10/2013   Procedure: VIDEO BRONCHOSCOPY WITH ENDOBRONCHIAL NAVIGATION;  Surgeon: Melrose Nakayama, MD;  Location: [facility];  Service: Thoracic;  Laterality: N/A;  NO BLOOD THINNERS BUT PATIENT HAS ITP  . WEDGE RESECTION  04/2011   carcinoma of lung    Vitals:   08/22/16 1835  BP: (!) 146/91  Pulse: 76        Subjective Assessment - 08/22/16 1518    Subjective Pt stated he fell last night while looking under chair and thinks he blacked out while trying to stand back up.  Has bandage on Rt wrist, no reports of pain today.  Went to MD earlier about UTI, reports BP was 143/77 mmHg.  Pt entered dept withoutAD, stated he was rushed to get to MD apt then PT today.     Pertinent History Multiple falls recently, including one last month that resulted in head trauma, hospital admission, and subsequent short term rehab admission. He estimates that he has fallen about 5 times in the last 6 months, twice missing a chair twice, and tumbling forward to pick up a newspaper.   Patient Stated Goals improve balance    Currently in Pain? No/denies                         [facility] Adult PT Treatment/Exercise - 08/22/16 0001      Knee/Hip Exercises: Standing   Other Standing Knee Exercises Trial: prone to quadruped; unable to complete due to Rt shoulder weakness   Other Standing Knee Exercises Quadruped to tall kneeling x 1 minute wiht min A and cueing to improve posture             Balance Exercises - 08/22/16 1836      Balance Exercises: Standing   Standing Eyes Opened Narrow base of support (BOS);Foam/compliant surface   Tandem Stance Eyes open;3 reps;30 secs;Intermittent upper  extremity support   SLS 3 reps;Intermittent upper extremity support;30 secs  foot on 12in step with intermittent HHA   Cone Rotation Foam/compliant surface;Right turn;Left turn           PT Education - 08/22/16 1847    Education provided Yes   Education Details Reviewed importance of ambulating with AD; Educated on self awareness to complete abilities   Person(s) Educated Patient   Methods Explanation   Comprehension Verbalized understanding;Returned demonstration;Need further instruction          PT Short Term Goals - 08/13/16 1627      PT SHORT TERM GOAL #1   Title Pt will demo consistency and independence  with his HEP to improve strength and balance.    Baseline --   Time 2   Period Weeks   Status New     PT SHORT TERM GOAL #2   Title Pt will demonstrate understanding of the importance of balance prevention evident by his ability to verbalize atleast 2 ways to improve the layout of his house to decrease risk of falling   Baseline --   Time 2   Status New     PT SHORT TERM GOAL #3   Title --   Baseline --   Status --           PT Long Term Goals - 08/13/16 1754      PT LONG TERM GOAL #1   Title Pt will perform 5x sit to stand in less than 18 sec with minimal use of his UE, to demonstrate improved functional strength and power.   Time 4   Period Weeks   Status New     PT LONG TERM GOAL #2   Title Pt will perform the TUG in less than 14 sec with LRAD, to indicate he is at a decreased risk of falling in the community.   Time 4   Period Weeks   Status New     PT LONG TERM GOAL #3   Title Pt will make atleast a 3 point improvement on his Berg balance test, to demonstrate a clinically importance improvement in balance.   Time 4   Period Weeks   Status New               Plan - 08/22/16 1838    Clinical Impression Statement Pt entered dept without AD and reports fall while trying to pick up object under chair last night, no reports of pain during  session though does have bandage around wrist.  Discussion held on importance of ambulating with AD and improving awareness of safety and abilities.  Pt stated he was able to get up independently following fall though took 5 minutes to complete.  Began session with transition exercises for functional strengthening from prone to tall kneeling.  Pt unable to complete prone to quadruped positioning due to soreness/weakness Rt UE following fall yesterday.  Pt was able to complete quadruped transitioning to tall kneeling with min A for safety due to core weakness and cueing to improve posture.  Balance activities complete with min-mod A for safety to reduce risk of fall.  Pt did require seated rest breaks due to c/o dizziness.  Vitals taken.     Rehab Potential Good   PT Frequency --  2x/week for 1st week then reduce to 1x/week for 3 weeks   PT Duration 4 weeks   PT Treatment/Interventions ADLs/Self Care Home Management;Moist Heat;Therapeutic activities;Therapeutic exercise;Balance training;Neuromuscular re-education;Patient/family education;Gait training;Dry needling;Manual techniques;Passive range of motion;Stair training;Functional mobility training   PT Next Visit Plan Progress balance:  sit to stand no UE, SLS; standing balance: reaches, EC, foam support      Patient will benefit from skilled therapeutic intervention in order to improve the following deficits and impairments:  Abnormal gait, Decreased coordination, Decreased range of motion, Difficulty walking, Obesity, Pain, Improper body mechanics, Postural dysfunction, Decreased activity tolerance, Decreased balance, Decreased mobility, Decreased strength, Hypomobility  Visit Diagnosis: Other abnormalities of gait and mobility  Muscle weakness (generalized)  Repeated falls  Abnormal posture     Problem List Patient Active Problem List   Diagnosis Date Noted  . Stage 4 chronic kidney disease ([place])  06/30/2016  . Subdural hematoma,  post-traumatic ([place]) 03/21/2016  . Subdural hematoma ([place]) 03/21/2016  . 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 Bilirubinemia 12/12/2015  . Thrombocytopenia ([place]) 11/13/2015  . Cough 10/03/2015  . Allergic rhinitis 10/03/2015  . Secondary hyperparathyroidism ([place]) 09/06/2015  . Tremor, essential 08/27/2015  . History of colonic polyps   . Diverticulosis of colon without hemorrhage   . Malignant neoplasm of lateral wall of urinary bladder ([place]) 01/12/2014  . CIS (carcinoma in situ of bladder) 08/22/2013  . Static tremor 02/23/2013  . Diplopia 02/23/2013  . Diabetic polyneuropathy ([place]) 02/23/2013  . Abnormality of gait 02/23/2013  . COPD exacerbation ([place]) 01/31/2013  . Fracture of right distal radius 12/14/2012  . Diabetes mellitus ([place]) 11/30/2012  . Idiopathic thrombocytopenic purpura ([place]) 09/07/2012  . Depression 07/28/2012  . Benign prostate hyperplasia 06/10/2012  . LLQ pain 11/13/2011  . Hernia of anterior abdominal wall 10/28/2011  . AAA (abdominal aortic aneurysm) ([name])   . Tobacco abuse   . Diarrhea 10/22/2011  . Tubular adenoma of colon   . Bladder neck obstruction 06/05/2011  . Obstructive sleep apnea on CPAP 03/27/2011  . Fasting hyperglycemia 03/27/2011  . Recurrent nephrolithiasis 03/27/2011  . Carcinoma of bladder ([place]) 03/27/2011  . COPD (chronic obstructive pulmonary disease) ([place]) 02/24/2011  . Adenocarcinoma of right lung ([place]) 02/24/2011  . CAD (coronary artery disease) 01/06/2011  . Hypertension 01/06/2011  . Chronic kidney disease, stage III (moderate) 01/06/2011   Ihor Austin, LPTA; CBIS [phone]  Aldona Lento 08/22/2016, 6:48 PM  [facility] [address] [place], [place], [address] Phone: [phone]   Fax:  [phone]  Name: BRODRICK CURRAN MRN: [id number] Date of Birth: 04-02-1934

## 2016-08-25 ENCOUNTER — Other Ambulatory Visit: Payer: Self-pay | Admitting: Family Medicine

## 2016-08-25 ENCOUNTER — Ambulatory Visit (HOSPITAL_COMMUNITY): Payer: Medicare Other | Admitting: Physical Therapy

## 2016-08-25 DIAGNOSIS — M6281 Muscle weakness (generalized): Secondary | ICD-10-CM

## 2016-08-25 DIAGNOSIS — R293 Abnormal posture: Secondary | ICD-10-CM | POA: Diagnosis not present

## 2016-08-25 DIAGNOSIS — R2689 Other abnormalities of gait and mobility: Secondary | ICD-10-CM

## 2016-08-25 DIAGNOSIS — R296 Repeated falls: Secondary | ICD-10-CM | POA: Diagnosis not present

## 2016-08-25 NOTE — Therapy (Signed)
[place], [place], [address] Phone: [phone]   Fax:  [phone]  Physical Therapy Treatment  Patient Details  Name: Kyle [name] MRN: [id number] Date of Birth: 1934-04-15 Referring Provider: Mikey Kirschner, MD  Encounter Date: 08/25/2016      PT End of Session - 08/25/16 1036    Visit Number 4   Number of Visits 5   Date for PT Re-Evaluation 09/10/16   Authorization Type UHC- Medicare     Authorization Time Period 08/13/16 to 09/10/16   Authorization - Visit Number 4   Authorization - Number of Visits 5   PT Start Time 0950   PT Stop Time 1032   PT Time Calculation (min) 42 min   Equipment Utilized During Treatment Gait belt   Activity Tolerance Patient tolerated treatment well;Patient limited by fatigue;No increased pain   Behavior During Therapy WFL for tasks assessed/performed      Past Medical History:  Diagnosis Date  . AAA (abdominal aortic aneurysm) ([place]) 2004   s/p repair 2004; 4.3 cm infrarenal in 05/2011  . Abnormality of gait 02/23/2013  . Adenocarcinoma of right lung ([place]) 02/24/2011   Ct A/P 2012:  2cm lung mass RLL PET 1751:  Hypermetabolic RLL mass, no other hypermetabolic areas. TTNA 02/2011:  Adenocarcinoma, markers c/w lung origin Right lower lobe superior segmentectomy. 04/01/2011 Dr. Arlyce Dice   . Adenocarcinoma, lung ([place]) 01/2011   transthoracic FNA; resection of the superior segment of the RLL in 03/2011; negative nodes; no chemotherapy nor radiation planned  . Arm fracture    right arm  . Arteriosclerotic cardiovascular disease (ASCVD) 1973, 12/2010   S/P NSTEMI secondary to distal RCA/PL lesion, tx medically.  EF of  55%-60% per  echo.  . Benign prostatic hypertrophy    s/p transurethral resection of the prostate  . Bilateral renal masses    Cystic, more prominent on CT in 12/2010 than 2007; followed by Dr. Rosana Hoes  . Bladder cancer [facility]) 1996   Transurethral resection of the bladder +  chemotherapy/BCG as premed  . Cataract   . Chronic kidney disease    Creatinine 1.4 on discharge 12/20/2100; proteinuria; normal renal ultrasound in 2010; recent creatinines of 1.7-2.; Bilateral cystic renal masses by CT in 2011  . COPD (chronic obstructive pulmonary disease) ([place])   . Coronary artery disease   . Cough    thick phlegm  . Diabetes mellitus    Type II  . Diplopia 02/23/2013  . Diverticulosis   . Essential and other specified forms of tremor 02/23/2013  . Hx of Clostridium difficile infection   . Hyperlipidemia   . Hypertension   . Insomnia   . ITP (idiopathic thrombocytopenic purpura) 09/07/2012   Chronic ITP of adults versus medication-induced ITP.  Stable  . Myocardial infarction   . Nephrolithiasis 2012   ARF in 01/2011 due to obstructing nephrolithiasis  . Obesity   . OSA (obstructive sleep apnea)   . Polyneuropathy in diabetes(357.2) 02/23/2013  . Thrombocytopenia ([place])   . Tobacco abuse    50-pack-year consumption; quit in 12/2010  . Tubular adenoma of colon   . Ventral hernia     Past Surgical History:  Procedure Laterality Date  . ABDOMINAL AORTIC ANEURYSM REPAIR  2004  . CARDIAC CATHETERIZATION    . COLONOSCOPY  03/19/2010   Dr. Gala Romney -(poor prep) Anal papilla, rectal hyperplastic polyp, tubular adenoma removed splenic flexure, left-sided diverticula  . COLONOSCOPY  11/28/2004   RMR:  Diminutive rectal and left colon polyps as described above, cold  biopsied/removed/  Left sided diverticula. The remainder of the colonic mucosa appeared normal.  . COLONOSCOPY   09/15/01   RMR: Multiple diminutive polyps destroyed with dermolysis as described above/ Multiple small polyps on stalks in the colon resected with snare cautery/ Scattered pan colonic diverticulum/ The remainder of the colonic mucosa appeared normal  . COLONOSCOPY N/A 05/01/2015   Procedure: COLONOSCOPY;  Surgeon: Daneil Dolin, MD;  [facility];  Service: Endoscopy;  Laterality: N/A;  1115   . CYSTECTOMY    . CYSTOSCOPY  04/2014  . CYSTOSTOMY W/ BLADDER BIOPSY    . FLEXIBLE SIGMOIDOSCOPY  2014   Dr. Olevia Perches: tubular adenoma, negative stool studies   . LUNG LOBECTOMY    . TONSILLECTOMY    . TRANSURETHRAL RESECTION OF PROSTATE    . VIDEO BRONCHOSCOPY WITH ENDOBRONCHIAL NAVIGATION N/A 10/10/2013   Procedure: VIDEO BRONCHOSCOPY WITH ENDOBRONCHIAL NAVIGATION;  Surgeon: Melrose Nakayama, MD;  Location: [facility];  Service: Thoracic;  Laterality: N/A;  NO BLOOD THINNERS BUT PATIENT HAS ITP  . WEDGE RESECTION  04/2011   carcinoma of lung    There were no vitals filed for this visit.      Subjective Assessment - 08/25/16 1036    Subjective Pt states he feels much better today.  STates after his 3rd type of antibiotic, he is finally getting over his UTI.  No complaints or pain today.   Currently in Pain? No/denies                         [facility] Adult PT Treatment/Exercise - 08/25/16 0001      Knee/Hip Exercises: Seated   Sit to Sand 10 reps;without UE support             Balance Exercises - 08/25/16 1002      Balance Exercises: Standing   Standing Eyes Opened Narrow base of support (BOS);Foam/compliant surface   Tandem Stance Eyes open;3 reps;30 secs;Intermittent upper extremity support   SLS 3 reps;Intermittent upper extremity support;30 secs   Tandem Gait 2 reps   Retro Gait 2 reps   Sidestepping 2 reps   Cone Rotation Foam/compliant surface;Right turn;Left turn   Cone Rotation Limitations 2X 8 cones             PT Short Term Goals - 08/13/16 1627      PT SHORT TERM GOAL #1   Title Pt will demo consistency and independence with his HEP to improve strength and balance.    Baseline --   Time 2   Period Weeks   Status New     PT SHORT TERM GOAL #2   Title Pt will demonstrate understanding of the importance of balance prevention evident by his ability to verbalize atleast 2 ways to improve the layout of his house to decrease risk of  falling   Baseline --   Time 2   Status New     PT SHORT TERM GOAL #3   Title --   Baseline --   Status --           PT Long Term Goals - 08/13/16 1754      PT LONG TERM GOAL #1   Title Pt will perform 5x sit to stand in less than 18 sec with minimal use of his UE, to demonstrate improved functional strength and power.   Time 4   Period Weeks  Status New     PT LONG TERM GOAL #2   Title Pt will perform the TUG in less than 14 sec with LRAD, to indicate he is at a decreased risk of falling in the community.   Time 4   Period Weeks   Status New     PT LONG TERM GOAL #3   Title Pt will make atleast a 3 point improvement on his Berg balance test, to demonstrate a clinically importance improvement in balance.   Time 4   Period Weeks   Status New               Plan - 08/25/16 1037    Clinical Impression Statement Focused session on static and dynamic balance.  Pt required 2 short seated breaks during session, but overall minimal LOB and much improved and no dizziness.  Tandem gait remains most difficult as well as unstable surfaces.   Rehab Potential Good   PT Frequency --  2x/week for 1st week then reduce to 1x/week for 3 weeks   PT Duration 4 weeks   PT Treatment/Interventions ADLs/Self Care Home Management;Moist Heat;Therapeutic activities;Therapeutic exercise;Balance training;Neuromuscular re-education;Patient/family education;Gait training;Dry needling;Manual techniques;Passive range of motion;Stair training;Functional mobility training   PT Next Visit Plan Progress balance:  sit to stand no UE, SLS; standing balance: reaches, EC, foam support      Patient will benefit from skilled therapeutic intervention in order to improve the following deficits and impairments:  Abnormal gait, Decreased coordination, Decreased range of motion, Difficulty walking, Obesity, Pain, Improper body mechanics, Postural dysfunction, Decreased activity tolerance, Decreased balance,  Decreased mobility, Decreased strength, Hypomobility  Visit Diagnosis: Other abnormalities of gait and mobility  Muscle weakness (generalized)  Repeated falls  Abnormal posture     Problem List Patient Active Problem List   Diagnosis Date Noted  . Stage 4 chronic kidney disease ([place]) 06/30/2016  . Subdural hematoma, post-traumatic ([place]) 03/21/2016  . Subdural hematoma ([place]) 03/21/2016  . Malignant neoplasm of trigone of urinary bladder ([place]) 02/12/2016  . Bilirubinemia 12/12/2015  . Thrombocytopenia ([place]) 11/13/2015  . Cough 10/03/2015  . Allergic rhinitis 10/03/2015  . Secondary hyperparathyroidism ([place]) 09/06/2015  . Tremor, essential 08/27/2015  . History of colonic polyps   . Diverticulosis of colon without hemorrhage   . Malignant neoplasm of lateral wall of urinary bladder ([place]) 01/12/2014  . CIS (carcinoma in situ of bladder) 08/22/2013  . Static tremor 02/23/2013  . Diplopia 02/23/2013  . Diabetic polyneuropathy ([place]) 02/23/2013  . Abnormality of gait 02/23/2013  . COPD exacerbation ([place]) 01/31/2013  . Fracture of right distal radius 12/14/2012  . Diabetes mellitus ([place]) 11/30/2012  . Idiopathic thrombocytopenic purpura ([place]) 09/07/2012  . Depression 07/28/2012  . Benign prostate hyperplasia 06/10/2012  . LLQ pain 11/13/2011  . Hernia of anterior abdominal wall 10/28/2011  . AAA (abdominal aortic aneurysm) ([place])   . Tobacco abuse   . Diarrhea 10/22/2011  . Tubular adenoma of colon   . Bladder neck obstruction 06/05/2011  . Obstructive sleep apnea on CPAP 03/27/2011  . Fasting hyperglycemia 03/27/2011  . Recurrent nephrolithiasis 03/27/2011  . Carcinoma of bladder ([place]) 03/27/2011  . COPD (chronic obstructive pulmonary disease) ([place]) 02/24/2011  . Adenocarcinoma of right lung ([place]) 02/24/2011  . CAD (coronary artery disease) 01/06/2011  . Hypertension 01/06/2011  . Chronic kidney disease, stage III (moderate) 01/06/2011   Teena Irani,  PTA/CLT [phone] 08/25/2016, 10:41 AM  [place], [place], [address]  Phone: [phone]   Fax:  [phone]  Name: Kyle [name] MRN: [id number] Date of Birth: 09/30/1933

## 2016-08-26 ENCOUNTER — Other Ambulatory Visit: Payer: Self-pay | Admitting: *Deleted

## 2016-08-26 NOTE — Patient Outreach (Signed)
[facility]) Care Management  08/26/2016  SAMIEL PEEL Apr 19, 1934 [id number]   I reached out to Mr. Vannote today to follow up on his urinary symptoms and questions about use of Spiriva as related to urinary function. Mr. Wassenaar told me he received a call from the pharmacist at the urology office at [facility] after I passed along a message related to his concerns. In addition, Mr. Fishbaugh saw his urologist in the office on Monday and discussed this with him and was told that it was safe for him to take Spiriva. Also, Mr. Arkwright was started on a once nightly oral antibiotic by Dr. Rosana Hoes for ongoing urinary symptoms.  Plan: Mr. Moening is to report any new or worsening urinary symptoms to Dr. Rosana Hoes. I will follow up with Mr. Wiltsey by phone over the next few weeks and will plan to transition to telephonic case management vs discharge depending on his progress.    [facility]  [phone]

## 2016-08-27 ENCOUNTER — Other Ambulatory Visit (HOSPITAL_COMMUNITY)
Admission: RE | Admit: 2016-08-27 | Discharge: 2016-08-27 | Disposition: A | Discharge status: Home / Self Care | Payer: Medicare Other | Source: Ambulatory Visit | Attending: Internal Medicine | Admitting: Internal Medicine

## 2016-08-27 DIAGNOSIS — D631 Anemia in chronic kidney disease: Secondary | ICD-10-CM | POA: Insufficient documentation

## 2016-08-27 DIAGNOSIS — I10 Essential (primary) hypertension: Secondary | ICD-10-CM | POA: Insufficient documentation

## 2016-08-27 DIAGNOSIS — N200 Calculus of kidney: Secondary | ICD-10-CM | POA: Diagnosis not present

## 2016-08-27 DIAGNOSIS — N184 Chronic kidney disease, stage 4 (severe): Secondary | ICD-10-CM | POA: Diagnosis not present

## 2016-08-27 DIAGNOSIS — N2581 Secondary hyperparathyroidism of renal origin: Secondary | ICD-10-CM | POA: Diagnosis not present

## 2016-08-27 LAB — URIC ACID: URIC ACID, SERUM: 7.5 mg/dL (ref 4.4–7.6)

## 2016-08-27 LAB — COMPREHENSIVE METABOLIC PANEL
ALBUMIN: 3.5 g/dL (ref 3.5–5.0)
ALT: 14 U/L — ABNORMAL LOW (ref 17–63)
ANION GAP: 8 (ref 5–15)
AST: 14 U/L — ABNORMAL LOW (ref 15–41)
Alkaline Phosphatase: 53 U/L (ref 38–126)
BILIRUBIN TOTAL: 1.2 mg/dL (ref 0.3–1.2)
BUN: 32 mg/dL — ABNORMAL HIGH (ref 6–20)
CO2: 25 mmol/L (ref 22–32)
Calcium: 8.9 mg/dL (ref 8.9–10.3)
Chloride: 106 mmol/L (ref 101–111)
Creatinine, Ser: 2.63 mg/dL — ABNORMAL HIGH (ref 0.61–1.24)
GFR, EST AFRICAN AMERICAN: 25 mL/min — AB (ref >60)
GFR, EST NON AFRICAN AMERICAN: 21 mL/min — AB (ref >60)
GLUCOSE: 280 mg/dL — AB (ref 65–99)
POTASSIUM: 4.2 mmol/L (ref 3.5–5.1)
Sodium: 139 mmol/L (ref 135–145)
TOTAL PROTEIN: 6.4 g/dL — AB (ref 6.5–8.1)

## 2016-08-27 LAB — CBC WITH DIFFERENTIAL/PLATELET
BASOS PCT: 0 %
Basophils Absolute: 0 10*3/uL (ref 0.0–0.1)
Eosinophils Absolute: 0.2 10*3/uL (ref 0.0–0.7)
Eosinophils Relative: 3 %
HEMATOCRIT: 38 % — AB (ref 39.0–52.0)
Hemoglobin: 12.4 g/dL — ABNORMAL LOW (ref 13.0–17.0)
Lymphocytes Relative: 23 %
Lymphs Abs: 1.2 10*3/uL (ref 0.7–4.0)
MCH: 31.4 pg (ref 26.0–34.0)
MCHC: 32.6 g/dL (ref 30.0–36.0)
MCV: 96.2 fL (ref 78.0–100.0)
MONO ABS: 0.3 10*3/uL (ref 0.1–1.0)
MONOS PCT: 6 %
NEUTROS ABS: 3.4 10*3/uL (ref 1.7–7.7)
Neutrophils Relative %: 67 %
Platelets: 86 10*3/uL — ABNORMAL LOW (ref 150–400)
RBC: 3.95 MIL/uL — ABNORMAL LOW (ref 4.22–5.81)
RDW: 16.7 % — AB (ref 11.5–15.5)
WBC: 5 10*3/uL (ref 4.0–10.5)

## 2016-08-27 LAB — URINALYSIS, ROUTINE W REFLEX MICROSCOPIC
BACTERIA UA: NONE SEEN
Bilirubin Urine: NEGATIVE
Glucose, UA: 50 mg/dL — AB
Ketones, ur: NEGATIVE mg/dL
LEUKOCYTES UA: NEGATIVE
NITRITE: NEGATIVE
Specific Gravity, Urine: 1.015 (ref 1.005–1.030)
pH: 5 (ref 5.0–8.0)

## 2016-08-27 LAB — IRON AND TIBC
IRON: 53 ug/dL (ref 45–182)
SATURATION RATIOS: 20 % (ref 17.9–39.5)
TIBC: 269 ug/dL (ref 250–450)
UIBC: 216 ug/dL

## 2016-08-27 LAB — PROTEIN / CREATININE RATIO, URINE
Creatinine, Urine: 104.02 mg/dL
Protein Creatinine Ratio: 2.25 mg/mg{Cre} — ABNORMAL HIGH (ref 0.00–0.15)
Total Protein, Urine: 234 mg/dL

## 2016-08-27 LAB — PHOSPHORUS: Phosphorus: 3.1 mg/dL (ref 2.5–4.6)

## 2016-08-27 LAB — FERRITIN: Ferritin: 220 ng/mL (ref 24–336)

## 2016-08-27 LAB — MAGNESIUM: MAGNESIUM: 2 mg/dL (ref 1.7–2.4)

## 2016-08-28 LAB — PARATHYROID HORMONE, INTACT (NO CA): PTH: 56 pg/mL (ref 15–65)

## 2016-08-29 DIAGNOSIS — Z09 Encounter for follow-up examination after completed treatment for conditions other than malignant neoplasm: Secondary | ICD-10-CM | POA: Diagnosis not present

## 2016-09-01 ENCOUNTER — Ambulatory Visit (HOSPITAL_COMMUNITY): Payer: Medicare Other | Admitting: Physical Therapy

## 2016-09-01 DIAGNOSIS — M6281 Muscle weakness (generalized): Secondary | ICD-10-CM | POA: Diagnosis not present

## 2016-09-01 DIAGNOSIS — R293 Abnormal posture: Secondary | ICD-10-CM

## 2016-09-01 DIAGNOSIS — R2689 Other abnormalities of gait and mobility: Secondary | ICD-10-CM | POA: Diagnosis not present

## 2016-09-01 DIAGNOSIS — R296 Repeated falls: Secondary | ICD-10-CM | POA: Diagnosis not present

## 2016-09-01 NOTE — Therapy (Signed)
[facility] [address] [place], [place], [address] Phone: [phone]   Fax:  [phone]  Physical Therapy Treatment (reassessment)  Patient Details  Name: Kyle [name] MRN: [id number] Date of Birth: [birth year] Referring Provider: Mikey Kirschner, MD  Encounter Date: 09/01/2016      PT End of Session - 09/01/16 1033    Visit Number 5   Number of Visits 5   Date for PT Re-Evaluation 09/10/16   Authorization Type [employer]- Medicare     Authorization Time Period 08/13/16 to 09/10/16   Authorization - Visit Number 5   Authorization - Number of Visits 5   PT Start Time 6073   PT Stop Time 1112   PT Time Calculation (min) 42 min   Equipment Utilized During Treatment Gait belt   Activity Tolerance Patient tolerated treatment well;Patient limited by fatigue;No increased pain   Behavior During Therapy WFL for tasks assessed/performed      Past Medical History:  Diagnosis Date  . AAA (abdominal aortic aneurysm) ([place]) 2004   s/p repair 2004; 4.3 cm infrarenal in 05/2011  . Abnormality of gait 02/23/2013  . Adenocarcinoma of right lung ([place]) 02/24/2011   Ct A/P 2012:  2cm lung mass RLL PET 7106:  Hypermetabolic RLL mass, no other hypermetabolic areas. TTNA 02/2011:  Adenocarcinoma, markers c/w lung origin Right lower lobe superior segmentectomy. 04/01/2011 Dr. Arlyce Dice   . Adenocarcinoma, lung ([place]) 01/2011   transthoracic FNA; resection of the superior segment of the RLL in 03/2011; negative nodes; no chemotherapy nor radiation planned  . Arm fracture    right arm  . Arteriosclerotic cardiovascular disease (ASCVD) 1973, 12/2010   S/P NSTEMI secondary to distal RCA/PL lesion, tx medically.  EF of  55%-60% per  echo.  . Benign prostatic hypertrophy    s/p transurethral resection of the prostate  . Bilateral renal masses    Cystic, more prominent on CT in 12/2010 than 2007; followed by Dr. Rosana Hoes  . Bladder cancer [facility]) 1996   Transurethral resection  of the bladder + chemotherapy/BCG as premed  . Cataract   . Chronic kidney disease    Creatinine 1.4 on discharge 12/20/2100; proteinuria; normal renal ultrasound in 2010; recent creatinines of 1.7-2.; Bilateral cystic renal masses by CT in 2011  . COPD (chronic obstructive pulmonary disease) ([place])   . Coronary artery disease   . Cough    thick phlegm  . Diabetes mellitus    Type II  . Diplopia 02/23/2013  . Diverticulosis   . Essential and other specified forms of tremor 02/23/2013  . Hx of Clostridium difficile infection   . Hyperlipidemia   . Hypertension   . Insomnia   . ITP (idiopathic thrombocytopenic purpura) 09/07/2012   Chronic ITP of adults versus medication-induced ITP.  Stable  . Myocardial infarction   . Nephrolithiasis 2012   ARF in 01/2011 due to obstructing nephrolithiasis  . Obesity   . OSA (obstructive sleep apnea)   . Polyneuropathy in diabetes(357.2) 02/23/2013  . Thrombocytopenia ([place])   . Tobacco abuse    50-pack-year consumption; quit in 12/2010  . Tubular adenoma of colon   . Ventral hernia     Past Surgical History:  Procedure Laterality Date  . ABDOMINAL AORTIC ANEURYSM REPAIR  2004  . CARDIAC CATHETERIZATION    . COLONOSCOPY  03/19/2010   Dr. Gala Romney -(poor prep) Anal papilla, rectal hyperplastic polyp, tubular adenoma removed splenic flexure, left-sided diverticula  . COLONOSCOPY  11/28/2004   RMR:  Diminutive rectal and left colon polyps as described above, cold  biopsied/removed/  Left sided diverticula. The remainder of the colonic mucosa appeared normal.  . COLONOSCOPY   09/15/01   RMR: Multiple diminutive polyps destroyed with dermolysis as described above/ Multiple small polyps on stalks in the colon resected with snare cautery/ Scattered pan colonic diverticulum/ The remainder of the colonic mucosa appeared normal  . COLONOSCOPY N/A 05/01/2015   Procedure: COLONOSCOPY;  Surgeon: Daneil Dolin, MD;  [facility];  Service: Endoscopy;   Laterality: N/A;  1115  . CYSTECTOMY    . CYSTOSCOPY  04/2014  . CYSTOSTOMY W/ BLADDER BIOPSY    . FLEXIBLE SIGMOIDOSCOPY  2014   Dr. Olevia Perches: tubular adenoma, negative stool studies   . LUNG LOBECTOMY    . TONSILLECTOMY    . TRANSURETHRAL RESECTION OF PROSTATE    . VIDEO BRONCHOSCOPY WITH ENDOBRONCHIAL NAVIGATION N/A 10/10/2013   Procedure: VIDEO BRONCHOSCOPY WITH ENDOBRONCHIAL NAVIGATION;  Surgeon: Melrose Nakayama, MD;  Location: [facility];  Service: Thoracic;  Laterality: N/A;  NO BLOOD THINNERS BUT PATIENT HAS ITP  . WEDGE RESECTION  04/2011   carcinoma of lung    There were no vitals filed for this visit.      Subjective Assessment - 09/01/16 1032    Subjective Pt states he feels pretty good. He denies any falls or close calls over the weekend. He states that he feels that his balance has improved since using his SPC "most all the time."   Currently in Pain? No/denies            [facility] PT Assessment - 09/01/16 0001      Strength   Right Hip Flexion 5/5   Left Hip Flexion 5/5   Right Knee Flexion 5/5   Right Knee Extension 5/5   Left Knee Flexion 5/5   Left Knee Extension 5/5   Right Ankle Dorsiflexion 5/5   Left Ankle Dorsiflexion 5/5     Transfers   Transfers Sit to Stand   Sit to Stand 6: Modified independent (Device/Increase time)   Five time sit to stand comments  19.72, no UE support     Ambulation/Gait   Gait Comments 20, no AD, slightly unsteady with turn     Standardized Balance Assessment   Standardized Balance Assessment Berg Balance Test     Berg Balance Test   Sit to Stand Able to stand without using hands and stabilize independently   Standing Unsupported Able to stand safely 2 minutes   Sitting with Back Unsupported but Feet Supported on Floor or Stool Able to sit safely and securely 2 minutes   Stand to Sit Sits safely with minimal use of hands   Transfers Able to transfer safely, minor use of hands   Standing Unsupported with Eyes Closed  Able to stand 10 seconds safely   Standing Ubsupported with Feet Together Able to place feet together independently and stand 1 minute safely   From Standing, Reach Forward with Outstretched Arm Can reach forward >12 cm safely (5")   From Standing Position, Pick up Object from Floor Able to pick up shoe safely and easily   From Standing Position, Turn to Look Behind Over each Shoulder Looks behind from both sides and weight shifts well   Turn 360 Degrees Able to turn 360 degrees safely but slowly   Standing Unsupported, Alternately Place Feet on Step/Stool Able to stand independently and safely and complete 8 steps in 20 seconds  Standing Unsupported, One Foot in Front Able to plae foot ahead of the other independently and hold 30 seconds   Standing on One Leg Tries to lift leg/unable to hold 3 seconds but remains standing independently   Total Score 49                     [facility] Adult PT Treatment/Exercise - 09/01/16 0001      Knee/Hip Exercises: Seated   Sit to Sand 10 reps;without UE support             Balance Exercises - 09/01/16 1105      Balance Exercises: Standing   SLS 5 reps;10 secs;Eyes open;Intermittent upper extremity support   Other Standing Exercises bil staggered stance with front foot on dyna disc, EO, 5 reps x 10 sec each with intermittent UE support           PT Education - 09/01/16 1119    Education provided Yes   Education Details reassessed pt's outcome measures and goals; educated pt on progress and importance of HEP to continue help making improvements; educated pt on POC   Person(s) Educated Patient   Methods Explanation   Comprehension Verbalized understanding;Returned demonstration          PT Short Term Goals - 09/01/16 1101      PT SHORT TERM GOAL #1   Title Pt will demo consistency and independence with his HEP to improve strength and balance.    Time 2   Period Weeks   Status On-going     PT SHORT TERM GOAL #2   Title  Pt will demonstrate understanding of the importance of balance prevention evident by his ability to verbalize atleast 2 ways to improve the layout of his house to decrease risk of falling   Time 2   Status Achieved           PT Long Term Goals - 09/01/16 1102      PT LONG TERM GOAL #1   Title Pt will perform 5x sit to stand in less than 18 sec with minimal use of his UE, to demonstrate improved functional strength and power.   Time 4   Period Weeks   Status On-going     PT LONG TERM GOAL #2   Title Pt will perform the TUG in less than 14 sec with LRAD, to indicate he is at a decreased risk of falling in the community.   Time 4   Period Weeks   Status On-going     PT LONG TERM GOAL #3   Title Pt will make atleast a 3 point improvement on his Berg balance test, to demonstrate a clinically importance improvement in balance.   Time 4   Period Weeks   Status On-going               Plan - 09/01/16 1120    Clinical Impression Statement PT reassessed pt's outcome measures and goals this date. Pt has made some improvements since beginning therapy. His BERG balance score remained a 49/56 and he demonstrated the most difficulty with NBOS and SLS balance. Pt's 5xSTS test did improve indicating improvements in functional strength and power. Pt has met 1 short term goal, while all others are on-going. Pt would benefit from continued skilled PT intervention for 2x/week for 4 more weeks to improve dynamic balance deficits in order to maximize overall function at home and in the community.    Rehab Potential Good  PT Frequency 4x / week  2x/week for 1st week then reduce to 1x/week for 3 weeks   PT Duration 4 weeks   PT Treatment/Interventions ADLs/Self Care Home Management;Moist Heat;Therapeutic activities;Therapeutic exercise;Balance training;Neuromuscular re-education;Patient/family education;Gait training;Dry needling;Manual techniques;Passive range of motion;Stair training;Functional  mobility training   PT Next Visit Plan Progress balance:  sit to stand no UE, SLS; standing balance: reaches, EC, foam support      Patient will benefit from skilled therapeutic intervention in order to improve the following deficits and impairments:  Abnormal gait, Decreased coordination, Decreased range of motion, Difficulty walking, Obesity, Pain, Improper body mechanics, Postural dysfunction, Decreased activity tolerance, Decreased balance, Decreased mobility, Decreased strength, Hypomobility  Visit Diagnosis: Other abnormalities of gait and mobility  Muscle weakness (generalized)  Repeated falls  Abnormal posture       G-Codes - 2016-09-10 1125    Functional Limitation Mobility: Walking and moving around;Changing and maintaining body position   Mobility: Walking and Moving Around Current Status [phone]) At least 40 percent but less than 60 percent impaired, limited or restricted   Mobility: Walking and Moving Around Goal Status [phone]) At least 20 percent but less than 40 percent impaired, limited or restricted   Changing and Maintaining Body Position Current Status (M6381) At least 40 percent but less than 60 percent impaired, limited or restricted   Changing and Maintaining Body Position Goal Status (R7116) At least 20 percent but less than 40 percent impaired, limited or restricted      Problem List Patient Active Problem List   Diagnosis Date Noted  . Stage 4 chronic kidney disease ([place]) 06/30/2016  . Subdural hematoma, post-traumatic ([place]) 03/21/2016  . Subdural hematoma ([place]) 03/21/2016  . Malignant neoplasm of trigone of urinary bladder ([place]) 02/12/2016  . Bilirubinemia 12/12/2015  . Thrombocytopenia ([place]) 11/13/2015  . Cough 10/03/2015  . Allergic rhinitis 10/03/2015  . Secondary hyperparathyroidism ([place]) 09/06/2015  . Tremor, essential 08/27/2015  . History of colonic polyps   . Diverticulosis of colon without hemorrhage   . Malignant neoplasm of lateral wall of  urinary bladder ([place]) 01/12/2014  . CIS (carcinoma in situ of bladder) 08/22/2013  . Static tremor 02/23/2013  . Diplopia 02/23/2013  . Diabetic polyneuropathy ([place]) 02/23/2013  . Abnormality of gait 02/23/2013  . COPD exacerbation ([place]) 01/31/2013  . Fracture of right distal radius 12/14/2012  . Diabetes mellitus ([place]) 11/30/2012  . Idiopathic thrombocytopenic purpura ([place]) 09/07/2012  . Depression 07/28/2012  . Benign prostate hyperplasia 06/10/2012  . LLQ pain 11/13/2011  . Hernia of anterior abdominal wall 10/28/2011  . AAA (abdominal aortic aneurysm) ([place])   . Tobacco abuse   . Diarrhea 10/22/2011  . Tubular adenoma of colon   . Bladder neck obstruction 06/05/2011  . Obstructive sleep apnea on CPAP 03/27/2011  . Fasting hyperglycemia 03/27/2011  . Recurrent nephrolithiasis 03/27/2011  . Carcinoma of bladder ([place]) 03/27/2011  . COPD (chronic obstructive pulmonary disease) ([place]) 02/24/2011  . Adenocarcinoma of right lung ([place]) 02/24/2011  . CAD (coronary artery disease) 01/06/2011  . Hypertension 01/06/2011  . Chronic kidney disease, stage III (moderate) 01/06/2011    Geraldine Solar PT, DPT   [facility] [address] [place], [place], [address] Phone: [phone]   Fax:  [phone]  Name: Kyle [name] MRN: [id number] Date of Birth: 1933/11/06

## 2016-09-08 ENCOUNTER — Ambulatory Visit (HOSPITAL_COMMUNITY): Payer: Medicare Other | Admitting: Physical Therapy

## 2016-09-08 ENCOUNTER — Encounter (HOSPITAL_COMMUNITY): Payer: Self-pay | Admitting: Physical Therapy

## 2016-09-08 DIAGNOSIS — R293 Abnormal posture: Secondary | ICD-10-CM

## 2016-09-08 DIAGNOSIS — R296 Repeated falls: Secondary | ICD-10-CM | POA: Diagnosis not present

## 2016-09-08 DIAGNOSIS — R2689 Other abnormalities of gait and mobility: Secondary | ICD-10-CM

## 2016-09-08 DIAGNOSIS — M6281 Muscle weakness (generalized): Secondary | ICD-10-CM

## 2016-09-08 NOTE — Patient Instructions (Addendum)
  SIT TO STAND - NO SUPPORT  Start by scooting close to the front of the chair.  Next, lean forward at your trunk and reach forward with your arms and rise to standing without using your hands to push off from the chair or other object.   Take 5 seconds to sit down  Use your arms as a counter-balance by reaching forward when in sitting and lower them as you approach standing.  Perform 2 sets, 5 reps, 1x/day   Mini Squat at Counter & with chair behind you for safety.  Standing with feet shoulder length apart, bend knees slightly and return to standing position. Don't let knees go over toes and keep hands lightly on counter top for support.  Perform 2 sets of 5 reps, 1x/day    With single-leg stance at the kitchen counter, steadily remove the amount of support you are providing yourself. For example, if you have 2 hands on the sink for support, take 1 away and practice standing on one leg. As that gets easier, go to 2 fingers on each hand for support and then later only have 1 hand; the goal is to be able to stand on 1 leg for 30 seconds without any arm support!

## 2016-09-08 NOTE — Therapy (Signed)
[place], [place], [address] Phone: [phone]   Fax:  [phone]  Physical Therapy Treatment  Patient Details  Name: Kyle [name] MRN: [id number] Date of Birth: [birth year] Referring Provider: Mikey Kirschner, MD  Encounter Date: 09/08/2016      PT End of Session - 09/08/16 1035    Visit Number 6   Number of Visits 13   Date for PT Re-Evaluation 09/10/16   Authorization Type UHC- Medicare     Authorization Time Period 08/13/16 to 09/10/16   Authorization - Visit Number 6   Authorization - Number of Visits 13   PT Start Time 3151   PT Stop Time 1115   PT Time Calculation (min) 44 min   Equipment Utilized During Treatment Gait belt   Activity Tolerance Patient tolerated treatment well;Patient limited by fatigue;No increased pain   Behavior During Therapy WFL for tasks assessed/performed      Past Medical History:  Diagnosis Date  . AAA (abdominal aortic aneurysm) ([place]) 2004   s/p repair 2004; 4.3 cm infrarenal in 05/2011  . Abnormality of gait 02/23/2013  . Adenocarcinoma of right lung ([employer]) 02/24/2011   Ct A/P 2012:  2cm lung mass RLL PET 7616:  Hypermetabolic RLL mass, no other hypermetabolic areas. TTNA 02/2011:  Adenocarcinoma, markers c/w lung origin Right lower lobe superior segmentectomy. 04/01/2011 Dr. Arlyce Dice   . Adenocarcinoma, lung ([place]) 01/2011   transthoracic FNA; resection of the superior segment of the RLL in 03/2011; negative nodes; no chemotherapy nor radiation planned  . Arm fracture    right arm  . Arteriosclerotic cardiovascular disease (ASCVD) 1973, 12/2010   S/P NSTEMI secondary to distal RCA/PL lesion, tx medically.  EF of  55%-60% per  echo.  . Benign prostatic hypertrophy    s/p transurethral resection of the prostate  . Bilateral renal masses    Cystic, more prominent on CT in 12/2010 than 2007; followed by Dr. Rosana Hoes  . Bladder cancer [facility]) 1996   Transurethral resection of the bladder  + chemotherapy/BCG as premed  . Cataract   . Chronic kidney disease    Creatinine 1.4 on discharge 12/20/2100; proteinuria; normal renal ultrasound in 2010; recent creatinines of 1.7-2.; Bilateral cystic renal masses by CT in 2011  . COPD (chronic obstructive pulmonary disease) ([place])   . Coronary artery disease   . Cough    thick phlegm  . Diabetes mellitus    Type II  . Diplopia 02/23/2013  . Diverticulosis   . Essential and other specified forms of tremor 02/23/2013  . Hx of Clostridium difficile infection   . Hyperlipidemia   . Hypertension   . Insomnia   . ITP (idiopathic thrombocytopenic purpura) 09/07/2012   Chronic ITP of adults versus medication-induced ITP.  Stable  . Myocardial infarction   . Nephrolithiasis 2012   ARF in 01/2011 due to obstructing nephrolithiasis  . Obesity   . OSA (obstructive sleep apnea)   . Polyneuropathy in diabetes(357.2) 02/23/2013  . Thrombocytopenia ([place])   . Tobacco abuse    50-pack-year consumption; quit in 12/2010  . Tubular adenoma of colon   . Ventral hernia     Past Surgical History:  Procedure Laterality Date  . ABDOMINAL AORTIC ANEURYSM REPAIR  2004  . CARDIAC CATHETERIZATION    . COLONOSCOPY  03/19/2010   Dr. Gala Romney -(poor prep) Anal papilla, rectal hyperplastic polyp, tubular adenoma removed splenic flexure, left-sided diverticula  . COLONOSCOPY  11/28/2004   RMR:  Diminutive rectal and left colon polyps as described above, cold  biopsied/removed/  Left sided diverticula. The remainder of the colonic mucosa appeared normal.  . COLONOSCOPY   09/15/01   RMR: Multiple diminutive polyps destroyed with dermolysis as described above/ Multiple small polyps on stalks in the colon resected with snare cautery/ Scattered pan colonic diverticulum/ The remainder of the colonic mucosa appeared normal  . COLONOSCOPY N/A 05/01/2015   Procedure: COLONOSCOPY;  Surgeon: Daneil Dolin, MD;  [facility];  Service: Endoscopy;  Laterality: N/A;  1115   . CYSTECTOMY    . CYSTOSCOPY  04/2014  . CYSTOSTOMY W/ BLADDER BIOPSY    . FLEXIBLE SIGMOIDOSCOPY  2014   Dr. Olevia Perches: tubular adenoma, negative stool studies   . LUNG LOBECTOMY    . TONSILLECTOMY    . TRANSURETHRAL RESECTION OF PROSTATE    . VIDEO BRONCHOSCOPY WITH ENDOBRONCHIAL NAVIGATION N/A 10/10/2013   Procedure: VIDEO BRONCHOSCOPY WITH ENDOBRONCHIAL NAVIGATION;  Surgeon: Melrose Nakayama, MD;  Location: [facility];  Service: Thoracic;  Laterality: N/A;  NO BLOOD THINNERS BUT PATIENT HAS ITP  . WEDGE RESECTION  04/2011   carcinoma of lung    There were no vitals filed for this visit.      Subjective Assessment - 09/08/16 1034    Subjective Pt states he feels "pretty fair." He reports that his legs were sore for 3 days following last treatment session.   Currently in Pain? No/denies                         [facility] Adult PT Treatment/Exercise - 09/08/16 0001      Knee/Hip Exercises: Standing   Other Standing Knee Exercises mini squats, 2 sets x 5 reps  chair behind for form     Knee/Hip Exercises: Seated   Sit to Sand 10 reps;without UE support  5 reps with eccentric lowering (x 5 sec)             Balance Exercises - 09/08/16 1121      Balance Exercises: Standing   SLS Eyes open;Intermittent upper extremity support;10 secs  x 3 reps with bil light UE support, x 3 reps with 1 light UE   Other Standing Exercises bil staggered stance with front foot on dyna disc, EO x 5 reps, EC x 5 reps x 10 sec each with intermittent UE support; increased difficulty with LLE back and increased sway to the R t/o; bil cone taps (x3 cones) x 3 reps each with intermittent UE support t/o           PT Education - 09/08/16 1120    Education provided Yes   Education Details Provided updated HEP since pt wanted to self-discharge this date.   Person(s) Educated Patient   Methods Explanation;Demonstration;Handout   Comprehension Verbalized understanding;Returned  demonstration          PT Short Term Goals - 09/01/16 1101      PT SHORT TERM GOAL #1   Title Pt will demo consistency and independence with his HEP to improve strength and balance.    Time 2   Period Weeks   Status On-going     PT SHORT TERM GOAL #2   Title Pt will demonstrate understanding of the importance of balance prevention evident by his ability to verbalize atleast 2 ways to improve the layout of his house to decrease risk of falling   Time 2   Status Achieved  PT Long Term Goals - 09/01/16 1102      PT LONG TERM GOAL #1   Title Pt will perform 5x sit to stand in less than 18 sec with minimal use of his UE, to demonstrate improved functional strength and power.   Time 4   Period Weeks   Status On-going     PT LONG TERM GOAL #2   Title Pt will perform the TUG in less than 14 sec with LRAD, to indicate he is at a decreased risk of falling in the community.   Time 4   Period Weeks   Status On-going     PT LONG TERM GOAL #3   Title Pt will make atleast a 3 point improvement on his Berg balance test, to demonstrate a clinically importance improvement in balance.   Time 4   Period Weeks   Status On-going               Plan - Sep 13, 2016 1123    Clinical Impression Statement Pt verbalized desire to self-discharge this date as he feels as though he can do anything he wants at home and in the community. He states that he always has his [facility] with him when he is walking and he does not pick stuff up off the floor so as to decrease his risk of falling. Did not reassess goals or outcome measures since those were taken last treatment session. PT provided pt with progressed HEP to maintain gains made during therapy. Pt verbalized understanding of this. Due to ptj's desire to end therapy, pt will be discharged to progressed HEP at this time.   Rehab Potential Good   PT Frequency 4x / week  2x/week for 1st week then reduce to 1x/week for 3 weeks   PT Duration 4  weeks   PT Treatment/Interventions ADLs/Self Care Home Management;Moist Heat;Therapeutic activities;Therapeutic exercise;Balance training;Neuromuscular re-education;Patient/family education;Gait training;Dry needling;Manual techniques;Passive range of motion;Stair training;Functional mobility training   PT Next Visit Plan --   PT Home Exercise Plan 09-13-16: continue current HEP and add in eccentric sit <> stands, mini squats with chair at sink, and decreasing UE support for SLS   Consulted and Agree with Plan of Care Patient      Patient will benefit from skilled therapeutic intervention in order to improve the following deficits and impairments:  Abnormal gait, Decreased coordination, Decreased range of motion, Difficulty walking, Obesity, Pain, Improper body mechanics, Postural dysfunction, Decreased activity tolerance, Decreased balance, Decreased mobility, Decreased strength, Hypomobility  Visit Diagnosis: Other abnormalities of gait and mobility  Muscle weakness (generalized)  Abnormal posture       G-Codes - Sep 13, 2016 1128    Functional Limitation Mobility: Walking and moving around;Changing and maintaining body position   Mobility: Walking and Moving Around Goal Status [phone]) At least 20 percent but less than 40 percent impaired, limited or restricted   Mobility: Walking and Moving Around Discharge Status [phone]) At least 40 percent but less than 60 percent impaired, limited or restricted   Changing and Maintaining Body Position Goal Status ([id number]) At least 20 percent but less than 40 percent impaired, limited or restricted   Changing and Maintaining Body Position Discharge Status ([id number]) At least 40 percent but less than 60 percent impaired, limited or restricted       Problem List Patient Active Problem List   Diagnosis Date Noted  . Stage 4 chronic kidney disease ([place]) 06/30/2016  . Subdural hematoma, post-traumatic ([place]) 03/21/2016  . Subdural hematoma ([place]) 03/21/2016   .  Malignant neoplasm of trigone of urinary bladder ([place]) 02/12/2016  . Bilirubinemia 12/12/2015  . Thrombocytopenia ([place]) 11/13/2015  . Cough 10/03/2015  . Allergic rhinitis 10/03/2015  . Secondary hyperparathyroidism ([place]) 09/06/2015  . Tremor, essential 08/27/2015  . History of colonic polyps   . Diverticulosis of colon without hemorrhage   . Malignant neoplasm of lateral wall of urinary bladder ([place]) 01/12/2014  . CIS (carcinoma in situ of bladder) 08/22/2013  . Static tremor 02/23/2013  . Diplopia 02/23/2013  . Diabetic polyneuropathy ([place]) 02/23/2013  . Abnormality of gait 02/23/2013  . COPD exacerbation ([place]) 01/31/2013  . Fracture of right distal radius 12/14/2012  . Diabetes mellitus ([place]) 11/30/2012  . Idiopathic thrombocytopenic purpura ([place]) 09/07/2012  . Depression 07/28/2012  . Benign prostate hyperplasia 06/10/2012  . LLQ pain 11/13/2011  . Hernia of anterior abdominal wall 10/28/2011  . AAA (abdominal aortic aneurysm) ([place])   . Tobacco abuse   . Diarrhea 10/22/2011  . Tubular adenoma of colon   . Bladder neck obstruction 06/05/2011  . Obstructive sleep apnea on CPAP 03/27/2011  . Fasting hyperglycemia 03/27/2011  . Recurrent nephrolithiasis 03/27/2011  . Carcinoma of bladder ([place]) 03/27/2011  . COPD (chronic obstructive pulmonary disease) ([place]) 02/24/2011  . Adenocarcinoma of right lung ([place]) 02/24/2011  . CAD (coronary artery disease) 01/06/2011  . Hypertension 01/06/2011  . Chronic kidney disease, stage III (moderate) 01/06/2011     PHYSICAL THERAPY DISCHARGE SUMMARY  Visits from Start of Care: 6  Current functional level related to goals / functional outcomes: Pt self-discharged stating he can do everything that he wants. See above for details.   Remaining deficits: Continues with increased difficulty with NBOS and SLS balance activities (per reassessment last session).    Education / Equipment: Provided progressed HEP to maintain gains made in  therapy. Plan: Patient agrees to discharge.  Patient goals were not met. Patient is being discharged due to the patient's request.  ?????        Geraldine Solar PT, [facility] [address] [place], [place], [address] Phone: [phone]   Fax:  [phone]  Name: NICKALOS PETERSEN MRN: [id number] Date of Birth: July 25, 1933

## 2016-09-11 DIAGNOSIS — G4733 Obstructive sleep apnea (adult) (pediatric): Secondary | ICD-10-CM | POA: Diagnosis not present

## 2016-09-15 ENCOUNTER — Other Ambulatory Visit: Payer: Self-pay | Admitting: *Deleted

## 2016-09-15 NOTE — Patient Outreach (Signed)
[facility]) Care Management  09/15/2016  Kyle [name] 01-22-1934 [id number]  Last week, Kyle [name] called stating he'd received correspondence from the maker of his Spiriva inhaler and had received a call from his pharmacy, both letting him know that "Spiriva can cause urinary problems". He was concerned about causality and asked me if I could help him get in touch with his urologist to get his input. I reached out to the office of Dr. Tresa Endo, urology/[facility] [phone]) and spoke with Dr. Rosana Hoes' nurse asking her to pass along Kyle [name] question about Spiriva and urinary symptoms, calling Kyle [name] with recommendations. She did so and Kyle [name] returned a call to me saying the doctor told him there was no problem with him continuing Spiriva.   Today, Kyle [name] says he feels well, has continued to take his Spiriva, and has graduated from outpatient PT. Kyle [name] has 3 doctor appointments this week, including one with urology.   Plan: I will follow up with Kyle [name] by phone in March.    [facility] Management  [phone]

## 2016-09-16 ENCOUNTER — Ambulatory Visit: Payer: Medicare Other | Admitting: Pulmonary Disease

## 2016-09-17 DIAGNOSIS — J449 Chronic obstructive pulmonary disease, unspecified: Secondary | ICD-10-CM | POA: Diagnosis not present

## 2016-09-19 ENCOUNTER — Other Ambulatory Visit: Payer: Self-pay | Admitting: Licensed Clinical Social Worker

## 2016-09-19 DIAGNOSIS — N3020 Other chronic cystitis without hematuria: Secondary | ICD-10-CM | POA: Diagnosis not present

## 2016-09-19 DIAGNOSIS — E119 Type 2 diabetes mellitus without complications: Secondary | ICD-10-CM | POA: Diagnosis not present

## 2016-09-19 DIAGNOSIS — H532 Diplopia: Secondary | ICD-10-CM | POA: Diagnosis not present

## 2016-09-19 DIAGNOSIS — H43813 Vitreous degeneration, bilateral: Secondary | ICD-10-CM | POA: Diagnosis not present

## 2016-09-19 DIAGNOSIS — N183 Chronic kidney disease, stage 3 (moderate): Secondary | ICD-10-CM | POA: Diagnosis not present

## 2016-09-19 DIAGNOSIS — H2513 Age-related nuclear cataract, bilateral: Secondary | ICD-10-CM | POA: Diagnosis not present

## 2016-09-19 DIAGNOSIS — C672 Malignant neoplasm of lateral wall of bladder: Secondary | ICD-10-CM | POA: Diagnosis not present

## 2016-09-19 NOTE — Patient Outreach (Signed)
Assessment:  CSW spoke via phone with client. CSW verified client identity. CSW and client spoke of client needs. Client sees Dr. Grace Bushy. [place] as primary care doctor. Client said he had his prescribed medications and is taking medications as prescribed. Client receives support from his spouse, Onofre Gains. [name] transports client to and from client's scheduled medical appointments. Client has a cane and a walker he can use as needed to assist him in ambulation. Client has had a history of falls. He has not had a recent fall. Client said he has completed all in home physical therapy sessions scheduled for client.  Client has also completed all scheduled Outpatient Physical Therapy sessions for client. CSW and client spoke of client care plan.  CSW encouraged client to communicate with CSW in next 30 days to discuss community resources of assistance for client. Client receives [place] support with RN [name]. CSW encouraged that client or Kimari Coudriet call CSW at [phone] as needed to discuss social work needs of client.     Plan:  Client to communicate with  CSW in next 30 days to discuss community resources of assistance for client.  CSW to collaborate with RN [name] in monitoring needs of client.  CSW to call client in 4 weeks to assess client needs at that time.   [name].Devonne Lalani MSW, LCSW Licensed Clinical Social Worker [facility] Care Management [phone]

## 2016-09-22 ENCOUNTER — Other Ambulatory Visit (HOSPITAL_COMMUNITY)
Admission: RE | Admit: 2016-09-22 | Discharge: 2016-09-22 | Disposition: A | Discharge status: Home / Self Care | Payer: Medicare Other | Source: Ambulatory Visit | Attending: Internal Medicine | Admitting: Internal Medicine

## 2016-09-22 DIAGNOSIS — N200 Calculus of kidney: Secondary | ICD-10-CM | POA: Insufficient documentation

## 2016-09-22 DIAGNOSIS — I10 Essential (primary) hypertension: Secondary | ICD-10-CM | POA: Insufficient documentation

## 2016-09-22 DIAGNOSIS — N184 Chronic kidney disease, stage 4 (severe): Secondary | ICD-10-CM | POA: Diagnosis not present

## 2016-09-22 DIAGNOSIS — D631 Anemia in chronic kidney disease: Secondary | ICD-10-CM | POA: Diagnosis not present

## 2016-09-22 DIAGNOSIS — N2581 Secondary hyperparathyroidism of renal origin: Secondary | ICD-10-CM | POA: Insufficient documentation

## 2016-09-22 LAB — CBC WITH DIFFERENTIAL/PLATELET
BASOS PCT: 0 %
Basophils Absolute: 0 10*3/uL (ref 0.0–0.1)
Eosinophils Absolute: 0.2 10*3/uL (ref 0.0–0.7)
Eosinophils Relative: 4 %
HEMATOCRIT: 42.1 % (ref 39.0–52.0)
HEMOGLOBIN: 13.7 g/dL (ref 13.0–17.0)
LYMPHS PCT: 21 %
Lymphs Abs: 1.1 10*3/uL (ref 0.7–4.0)
MCH: 31.4 pg (ref 26.0–34.0)
MCHC: 32.5 g/dL (ref 30.0–36.0)
MCV: 96.3 fL (ref 78.0–100.0)
MONOS PCT: 7 %
Monocytes Absolute: 0.3 10*3/uL (ref 0.1–1.0)
Neutro Abs: 3.5 10*3/uL (ref 1.7–7.7)
Neutrophils Relative %: 68 %
Platelets: 87 10*3/uL — ABNORMAL LOW (ref 150–400)
RBC: 4.37 MIL/uL (ref 4.22–5.81)
RDW: 17.1 % — ABNORMAL HIGH (ref 11.5–15.5)
WBC: 5.1 10*3/uL (ref 4.0–10.5)

## 2016-09-22 LAB — URINALYSIS, ROUTINE W REFLEX MICROSCOPIC
BILIRUBIN URINE: NEGATIVE
Glucose, UA: 100 mg/dL — AB
KETONES UR: NEGATIVE mg/dL
Leukocytes, UA: NEGATIVE
NITRITE: NEGATIVE
Specific Gravity, Urine: 1.025 (ref 1.005–1.030)
pH: 6 (ref 5.0–8.0)

## 2016-09-22 LAB — URINALYSIS, MICROSCOPIC (REFLEX)
Bacteria, UA: NONE SEEN
Squamous Epithelial / LPF: NONE SEEN

## 2016-09-22 LAB — MAGNESIUM: Magnesium: 2 mg/dL (ref 1.7–2.4)

## 2016-09-22 LAB — COMPREHENSIVE METABOLIC PANEL
ALBUMIN: 3.8 g/dL (ref 3.5–5.0)
ALK PHOS: 58 U/L (ref 38–126)
ALT: 13 U/L — ABNORMAL LOW (ref 17–63)
AST: 12 U/L — AB (ref 15–41)
Anion gap: 8 (ref 5–15)
BUN: 35 mg/dL — ABNORMAL HIGH (ref 6–20)
CHLORIDE: 107 mmol/L (ref 101–111)
CO2: 26 mmol/L (ref 22–32)
Calcium: 9.2 mg/dL (ref 8.9–10.3)
Creatinine, Ser: 2.67 mg/dL — ABNORMAL HIGH (ref 0.61–1.24)
GFR, EST AFRICAN AMERICAN: 24 mL/min — AB (ref >60)
GFR, EST NON AFRICAN AMERICAN: 21 mL/min — AB (ref >60)
Glucose, Bld: 218 mg/dL — ABNORMAL HIGH (ref 65–99)
POTASSIUM: 4.7 mmol/L (ref 3.5–5.1)
Sodium: 141 mmol/L (ref 135–145)
Total Bilirubin: 1.2 mg/dL (ref 0.3–1.2)
Total Protein: 7 g/dL (ref 6.5–8.1)

## 2016-09-22 LAB — PHOSPHORUS: PHOSPHORUS: 3 mg/dL (ref 2.5–4.6)

## 2016-09-22 LAB — IRON AND TIBC
IRON: 72 ug/dL (ref 45–182)
SATURATION RATIOS: 25 % (ref 17.9–39.5)
TIBC: 290 ug/dL (ref 250–450)
UIBC: 218 ug/dL

## 2016-09-22 LAB — URIC ACID: URIC ACID, SERUM: 8.2 mg/dL — AB (ref 4.4–7.6)

## 2016-09-23 ENCOUNTER — Other Ambulatory Visit (HOSPITAL_COMMUNITY)
Admission: RE | Admit: 2016-09-23 | Discharge: 2016-09-23 | Disposition: A | Discharge status: Home / Self Care | Payer: Medicare Other | Source: Ambulatory Visit | Attending: Internal Medicine | Admitting: Internal Medicine

## 2016-09-23 DIAGNOSIS — N184 Chronic kidney disease, stage 4 (severe): Secondary | ICD-10-CM | POA: Insufficient documentation

## 2016-09-23 LAB — PROTEIN / CREATININE RATIO, URINE
Creatinine, Urine: 94.56 mg/dL
Protein Creatinine Ratio: 2.75 mg/mg{Cre} — ABNORMAL HIGH (ref 0.00–0.15)
Total Protein, Urine: 260 mg/dL

## 2016-09-23 LAB — PARATHYROID HORMONE, INTACT (NO CA): PTH: 63 pg/mL (ref 15–65)

## 2016-09-25 ENCOUNTER — Encounter: Payer: Self-pay | Admitting: Pulmonary Disease

## 2016-09-25 ENCOUNTER — Ambulatory Visit (INDEPENDENT_AMBULATORY_CARE_PROVIDER_SITE_OTHER): Payer: Medicare Other | Admitting: Pulmonary Disease

## 2016-09-25 DIAGNOSIS — J438 Other emphysema: Secondary | ICD-10-CM | POA: Diagnosis not present

## 2016-09-25 DIAGNOSIS — J302 Other seasonal allergic rhinitis: Secondary | ICD-10-CM | POA: Diagnosis not present

## 2016-09-25 MED ORDER — TIOTROPIUM BROMIDE MONOHYDRATE 18 MCG IN CAPS: 18.0000 ug | ORAL_CAPSULE | Freq: Every day | RESPIRATORY_TRACT | 3 refills | Status: DC

## 2016-09-25 NOTE — Patient Instructions (Signed)
Keep taking your Spiriva as you are doing  For your allergic rhinitis: Use Neil Med rinses with distilled water at least twice per day using the instructions on the package. 1/2 hour after using the [facility] Med rinse, use Nasacort two puffs in each nostril once per day.  Remember that the Nasacort can take 1-2 weeks to work after regular use. Use generic zyrtec (cetirizine) every day.  If this doesn't help, then stop taking it and use chlorpheniramine-phenylephrine combination tablets.  We will see you back in 1 year or sooner if needed

## 2016-09-25 NOTE — Assessment & Plan Note (Signed)
This has been a stable interval for him.  He has not had an exacerbation since the last visit. He continues to tolerate the Spiriva HandiHaler well.  Plan: Continue Spiriva daily Follow-up one year

## 2016-09-25 NOTE — Progress Notes (Signed)
Subjective:    Patient ID: Kyle [name], male    DOB: [birth year], 83 y.o.   MRN: [id number]  Synopsis:  Former patient of Dr. Gwenette [name] with COPD PFT's 02/2011:  FEV1 2.40 (83%), ratio 60, nl TLC, DLCO 59%; treated with nocturnal oxygen. +response to spiriva alone >> not frequent exacerbator Trial of stiolto 2015 >> no change in breathing.  Participated in pulmonary rehab, then quit going.   Kyle [name] also had Lung cancer (Stage IA adeno, resected 2012) and urethral cancer AND Bladder cancer.    HPI Chief Complaint  Patient presents with  . Follow-up    pt states he is doing well today. tolerating cpap well.  CAT: Kyle [name] had trouble with bending over and loosing consciousness.  He has fallen a few times after a fall.  He had a fall and struck his head once and ended up in the hospital.  He is still takes Spiriva regularly.  No problems form it.  He hasn't had bronchitis or pneumonia.    He stil lhsa some congestion and is taking zyrtec.   Past Medical History:  Diagnosis Date  . AAA (abdominal aortic aneurysm) ([place]) 2004   s/p repair 2004; 4.3 cm infrarenal in 05/2011  . Abnormality of gait 02/23/2013  . Adenocarcinoma of right lung ([place]) 02/24/2011   Ct A/P 2012:  2cm lung mass RLL PET 9163:  Hypermetabolic RLL mass, no other hypermetabolic areas. TTNA 02/2011:  Adenocarcinoma, markers c/w lung origin Right lower lobe superior segmentectomy. 04/01/2011 Dr. Arlyce Dice   . Adenocarcinoma, lung ([place]) 01/2011   transthoracic FNA; resection of the superior segment of the RLL in 03/2011; negative nodes; no chemotherapy nor radiation planned  . Arm fracture    right arm  . Arteriosclerotic cardiovascular disease (ASCVD) 1973, 12/2010   S/P NSTEMI secondary to distal RCA/PL lesion, tx medically.  EF of  55%-60% per  echo.  . Benign prostatic hypertrophy    s/p transurethral resection of the prostate  . Bilateral renal masses    Cystic, more prominent on CT in 12/2010 than 2007; followed  by Dr. Rosana Hoes  . Bladder cancer [facility]) 1996   Transurethral resection of the bladder + chemotherapy/BCG as premed  . Cataract   . Chronic kidney disease    Creatinine 1.4 on discharge 12/20/2100; proteinuria; normal renal ultrasound in 2010; recent creatinines of 1.7-2.; Bilateral cystic renal masses by CT in 2011  . COPD (chronic obstructive pulmonary disease) ([place])   . Coronary artery disease   . Cough    thick phlegm  . Diabetes mellitus    Type II  . Diplopia 02/23/2013  . Diverticulosis   . Essential and other specified forms of tremor 02/23/2013  . Hx of Clostridium difficile infection   . Hyperlipidemia   . Hypertension   . Insomnia   . ITP (idiopathic thrombocytopenic purpura) 09/07/2012   Chronic ITP of adults versus medication-induced ITP.  Stable  . Myocardial infarction   . Nephrolithiasis 2012   ARF in 01/2011 due to obstructing nephrolithiasis  . Obesity   . OSA (obstructive sleep apnea)   . Polyneuropathy in diabetes(357.2) 02/23/2013  . Thrombocytopenia ([place])   . Tobacco abuse    50-pack-year consumption; quit in 12/2010  . Tubular adenoma of colon   . Ventral hernia       Review of Systems  Constitutional: Negative for chills, fatigue and fever.  HENT: Positive for postnasal drip, rhinorrhea and sinus pressure.   Respiratory: Positive  for cough. Negative for shortness of breath and wheezing.   Cardiovascular: Negative for chest pain and leg swelling.       Objective:   Physical Exam Vitals:   09/25/16 1503  BP: 124/72  Pulse: 73  SpO2: 94%  Weight: 271 lb (122.9 kg)  Height: 6' (1.829 m)    Gen: Obese, chronically ill appearing HENT: OP clear, TM's clear, neck supple PULM: Diminshed airflow, no wheezing B, normal percussion CV: RRR, no mgr, trace edema GI: BS+, soft, nontender Derm: no cyanosis or rash Psyche: normal mood and affect    CBC    Component Value Date/Time   WBC 5.1 09/22/2016 0919   RBC 4.37 09/22/2016 0919   HGB 13.7 09/22/2016  0919   HCT 42.1 09/22/2016 0919   PLT 87 (L) 09/22/2016 0919   MCV 96.3 09/22/2016 0919   MCH 31.4 09/22/2016 0919   MCHC 32.5 09/22/2016 0919   RDW 17.1 (H) 09/22/2016 0919   LYMPHSABS 1.1 09/22/2016 0919   MONOABS 0.3 09/22/2016 0919   EOSABS 0.2 09/22/2016 0919   BASOSABS 0.0 09/22/2016 0919         Assessment & Plan:  COPD (chronic obstructive pulmonary disease) ([place]) This has been a stable interval for him.  He has not had an exacerbation since the last visit. He continues to tolerate the Spiriva HandiHaler well.  Plan: Continue Spiriva daily Follow-up one year  Allergic rhinitis This has been more of a problem for him lately.  He states that he is not using Flonase rarely.  Plan: Advised on basic pharmacokinetics and appropriate use of nasal steroids Continue Zyrtec daily    Current Outpatient Prescriptions:  .  ACCU-CHEK AVIVA PLUS test strip, USE AS DIRECTED TO TEST ONCE DAILY., Disp: 50 each, Rfl: 5 .  albuterol (PROAIR HFA) 108 (90 BASE) MCG/ACT inhaler, Inhale 2 puffs into the lungs every 6 (six) hours as needed for wheezing., Disp: 1 Inhaler, Rfl: 5 .  ALPRAZolam (XANAX) 0.5 MG tablet, TAKE ONE TABLET TWICE DAILY AS NEEDED FOR ANXIETY, Disp: 60 tablet, Rfl: 5 .  aspirin EC 81 MG tablet, Take 81 mg by mouth daily., Disp: , Rfl:  .  cetirizine (ZYRTEC) 10 MG tablet, Take 1 tablet (10 mg total) by mouth daily., Disp: 30 tablet, Rfl: 5 .  citalopram (CELEXA) 20 MG tablet, TAKE ONE (1) TABLET BY MOUTH EVERY DAY, Disp: 30 tablet, Rfl: 11 .  Cyanocobalamin (RA VITAMIN B-12 TR) 1000 MCG TBCR, Take by mouth., Disp: , Rfl:  .  fluticasone (FLONASE) 50 MCG/ACT nasal spray, Place 1 spray into both nostrils daily., Disp: 16 g, Rfl: 2 .  ipratropium (ATROVENT) 0.06 % nasal spray, Place 2 sprays into both nostrils 4 (four) times daily., Disp: 15 mL, Rfl: 5 .  ipratropium (ATROVENT) 0.06 % nasal spray, , Disp: , Rfl:  .  metoprolol (LOPRESSOR) 50 MG tablet, Take 1/2 tablet by  mouth twice daily, Disp: 30 tablet, Rfl: 5 .  niacin (NIASPAN) 500 MG CR tablet, Take 2 tablets (1,000 mg total) by mouth at bedtime., Disp: 60 tablet, Rfl: 5 .  nitroGLYCERIN (NITROSTAT) 0.4 MG SL tablet, Place 1 tablet (0.4 mg total) under the tongue every 5 (five) minutes as needed. Call MD if need more than 2 (Patient taking differently: Place 0.4 mg under the tongue every 5 (five) minutes as needed for chest pain. Call MD if need more than 2), Disp: 3 tablet, Rfl: 0 .  pravastatin (PRAVACHOL) 20 MG tablet, Take 20 mg  by mouth at bedtime. , Disp: , Rfl:  .  Probiotic Product (ALIGN PO), Take 1 capsule by mouth daily., Disp: , Rfl:  .  tiotropium (SPIRIVA HANDIHALER) 18 MCG inhalation capsule, Place 1 capsule (18 mcg total) into inhaler and inhale daily., Disp: 90 capsule, Rfl: 3 .  vitamin B-12 1000 MCG tablet, Take 1 tablet (1,000 mcg total) by mouth daily., Disp: , Rfl:

## 2016-09-25 NOTE — Assessment & Plan Note (Signed)
This has been more of a problem for him lately.  He states that he is not using Flonase rarely.  Plan: Advised on basic pharmacokinetics and appropriate use of nasal steroids Continue Zyrtec daily

## 2016-09-29 ENCOUNTER — Encounter: Payer: Self-pay | Admitting: Cardiovascular Disease

## 2016-09-29 ENCOUNTER — Other Ambulatory Visit: Payer: Self-pay | Admitting: *Deleted

## 2016-09-29 ENCOUNTER — Ambulatory Visit (INDEPENDENT_AMBULATORY_CARE_PROVIDER_SITE_OTHER): Payer: Medicare Other | Admitting: Cardiovascular Disease

## 2016-09-29 ENCOUNTER — Emergency Department (HOSPITAL_COMMUNITY)
Admission: EM | Admit: 2016-09-29 | Discharge: 2016-09-29 | Disposition: A | Discharge status: Home / Self Care | Payer: Medicare Other | Attending: Emergency Medicine | Admitting: Emergency Medicine

## 2016-09-29 ENCOUNTER — Emergency Department (HOSPITAL_COMMUNITY): Payer: Medicare Other

## 2016-09-29 ENCOUNTER — Encounter (HOSPITAL_COMMUNITY): Payer: Self-pay | Admitting: Emergency Medicine

## 2016-09-29 VITALS — BP 130/76 | HR 72 | Ht 72.0 in | Wt 269.0 lb

## 2016-09-29 DIAGNOSIS — R319 Hematuria, unspecified: Secondary | ICD-10-CM | POA: Diagnosis not present

## 2016-09-29 DIAGNOSIS — Z8679 Personal history of other diseases of the circulatory system: Secondary | ICD-10-CM

## 2016-09-29 DIAGNOSIS — N184 Chronic kidney disease, stage 4 (severe): Secondary | ICD-10-CM | POA: Insufficient documentation

## 2016-09-29 DIAGNOSIS — I252 Old myocardial infarction: Secondary | ICD-10-CM | POA: Insufficient documentation

## 2016-09-29 DIAGNOSIS — J449 Chronic obstructive pulmonary disease, unspecified: Secondary | ICD-10-CM | POA: Diagnosis not present

## 2016-09-29 DIAGNOSIS — N1330 Unspecified hydronephrosis: Secondary | ICD-10-CM | POA: Insufficient documentation

## 2016-09-29 DIAGNOSIS — Z79899 Other long term (current) drug therapy: Secondary | ICD-10-CM | POA: Insufficient documentation

## 2016-09-29 DIAGNOSIS — Z7951 Long term (current) use of inhaled steroids: Secondary | ICD-10-CM | POA: Diagnosis not present

## 2016-09-29 DIAGNOSIS — G4733 Obstructive sleep apnea (adult) (pediatric): Secondary | ICD-10-CM | POA: Diagnosis not present

## 2016-09-29 DIAGNOSIS — Z9889 Other specified postprocedural states: Secondary | ICD-10-CM

## 2016-09-29 DIAGNOSIS — I714 Abdominal aortic aneurysm, without rupture: Secondary | ICD-10-CM | POA: Diagnosis not present

## 2016-09-29 DIAGNOSIS — Z85118 Personal history of other malignant neoplasm of bronchus and lung: Secondary | ICD-10-CM | POA: Diagnosis not present

## 2016-09-29 DIAGNOSIS — E785 Hyperlipidemia, unspecified: Secondary | ICD-10-CM | POA: Diagnosis not present

## 2016-09-29 DIAGNOSIS — N3091 Cystitis, unspecified with hematuria: Secondary | ICD-10-CM | POA: Diagnosis not present

## 2016-09-29 DIAGNOSIS — I10 Essential (primary) hypertension: Secondary | ICD-10-CM | POA: Diagnosis not present

## 2016-09-29 DIAGNOSIS — Z7982 Long term (current) use of aspirin: Secondary | ICD-10-CM | POA: Insufficient documentation

## 2016-09-29 DIAGNOSIS — E559 Vitamin D deficiency, unspecified: Secondary | ICD-10-CM | POA: Diagnosis not present

## 2016-09-29 DIAGNOSIS — I129 Hypertensive chronic kidney disease with stage 1 through stage 4 chronic kidney disease, or unspecified chronic kidney disease: Secondary | ICD-10-CM | POA: Insufficient documentation

## 2016-09-29 DIAGNOSIS — N200 Calculus of kidney: Secondary | ICD-10-CM | POA: Diagnosis not present

## 2016-09-29 DIAGNOSIS — I2510 Atherosclerotic heart disease of native coronary artery without angina pectoris: Secondary | ICD-10-CM

## 2016-09-29 DIAGNOSIS — F17210 Nicotine dependence, cigarettes, uncomplicated: Secondary | ICD-10-CM | POA: Diagnosis not present

## 2016-09-29 DIAGNOSIS — E1122 Type 2 diabetes mellitus with diabetic chronic kidney disease: Secondary | ICD-10-CM | POA: Insufficient documentation

## 2016-09-29 DIAGNOSIS — E875 Hyperkalemia: Secondary | ICD-10-CM | POA: Diagnosis not present

## 2016-09-29 DIAGNOSIS — Z8551 Personal history of malignant neoplasm of bladder: Secondary | ICD-10-CM | POA: Diagnosis not present

## 2016-09-29 LAB — CBC WITH DIFFERENTIAL/PLATELET
BASOS ABS: 0 10*3/uL (ref 0.0–0.1)
Basophils Relative: 0 %
EOS PCT: 3 %
Eosinophils Absolute: 0.2 10*3/uL (ref 0.0–0.7)
HCT: 41.4 % (ref 39.0–52.0)
Hemoglobin: 13.4 g/dL (ref 13.0–17.0)
LYMPHS ABS: 1.6 10*3/uL (ref 0.7–4.0)
LYMPHS PCT: 24 %
MCH: 31.4 pg (ref 26.0–34.0)
MCHC: 32.4 g/dL (ref 30.0–36.0)
MCV: 97 fL (ref 78.0–100.0)
MONO ABS: 0.6 10*3/uL (ref 0.1–1.0)
MONOS PCT: 9 %
Neutro Abs: 4.3 10*3/uL (ref 1.7–7.7)
Neutrophils Relative %: 64 %
PLATELETS: 84 10*3/uL — AB (ref 150–400)
RBC: 4.27 MIL/uL (ref 4.22–5.81)
RDW: 17.5 % — AB (ref 11.5–15.5)
WBC: 6.7 10*3/uL (ref 4.0–10.5)

## 2016-09-29 LAB — URINALYSIS, ROUTINE W REFLEX MICROSCOPIC
Bilirubin Urine: NEGATIVE
GLUCOSE, UA: 50 mg/dL — AB
Ketones, ur: 5 mg/dL — AB
Leukocytes, UA: NEGATIVE
Nitrite: NEGATIVE
PH: 6 (ref 5.0–8.0)
PROTEIN: 100 mg/dL — AB
Specific Gravity, Urine: 1.017 (ref 1.005–1.030)

## 2016-09-29 LAB — COMPREHENSIVE METABOLIC PANEL
ALT: 12 U/L — ABNORMAL LOW (ref 17–63)
ANION GAP: 7 (ref 5–15)
AST: 16 U/L (ref 15–41)
Albumin: 3.8 g/dL (ref 3.5–5.0)
Alkaline Phosphatase: 54 U/L (ref 38–126)
BILIRUBIN TOTAL: 1.1 mg/dL (ref 0.3–1.2)
BUN: 37 mg/dL — ABNORMAL HIGH (ref 6–20)
CHLORIDE: 112 mmol/L — AB (ref 101–111)
CO2: 25 mmol/L (ref 22–32)
Calcium: 9.2 mg/dL (ref 8.9–10.3)
Creatinine, Ser: 2.82 mg/dL — ABNORMAL HIGH (ref 0.61–1.24)
GFR, EST AFRICAN AMERICAN: 23 mL/min — AB (ref >60)
GFR, EST NON AFRICAN AMERICAN: 20 mL/min — AB (ref >60)
Glucose, Bld: 124 mg/dL — ABNORMAL HIGH (ref 65–99)
POTASSIUM: 5.1 mmol/L (ref 3.5–5.1)
Sodium: 144 mmol/L (ref 135–145)
TOTAL PROTEIN: 6.6 g/dL (ref 6.5–8.1)

## 2016-09-29 NOTE — ED Triage Notes (Signed)
Pt c/o hematuria, onset last pm.  St's now he is passing  Clots.  Pt has hx of UTI's but has never had blood in it.

## 2016-09-29 NOTE — ED Notes (Signed)
Patient notified that we need urine specimen

## 2016-09-29 NOTE — ED Notes (Signed)
Pharmacy at bedside

## 2016-09-29 NOTE — Discharge Instructions (Signed)
Call with your urologist tomorrow to arrange a follow-up appointment.  You have what appears to be an obstruction in your distal ureter and may require a ureteroscopy.  Return to the emergency department in the meantime if you develop severe pain, high fevers, an inability to urinate or other new and concerning symptoms.

## 2016-09-29 NOTE — Patient Outreach (Signed)
[facility]) Care Management  09/29/2016  Kyle [name] 10-06-1933 [id number]  Kyle [name] avery pleasant 83year old patient who has been followed off and onby [facility] for greater than 3years. He has multiple chronic medical problems including HTN, DMII, CAD, COPD, and CKD. [name] fell earlier in Novembersustaining a subdural hematoma and required a 3 day hospital stay. He was transitioned to the nursing facility for rehab and recovery prior to discharging to home.   [name] lives at home with his wife Kyle [name] who is his primary caregiver. He has been followed in the community off and on for more than 4 years by [facility] for assistance with chronic disease management and care coordination.   I noted that [name] was in the ED this afternoon and called his wife on her mobile phone. She reported that [name] began having pain and large blood clots when he was urinating. He called the office of Dr. Lawerance Bach (Urology @ [facility]) who has been his urologist for several years and he recommended that [name] report to the ED. I spoke briefly with Kyle [name].   Plan: I will follow up with Mr. & Mrs. Liberati by phone tomorrow.    [facility]  [phone]

## 2016-09-29 NOTE — ED Provider Notes (Signed)
[facility] DEPT Provider Note   CSN: [id number] Arrival date & time: 09/29/16  1611     History   Chief Complaint Chief Complaint  Patient presents with  . Hematuria    HPI Kyle [name] is a 83 y.o. male.  Patient is an 83 year old male with past medical history of bladder cancer, lung cancer, BPH, CAD, COPD, and hypertension. He presents for evaluation of hematuria. He had an episode of this 2 days ago which resolved. His symptoms then recurred this morning. He reports passing several clots through his urethra. He denies any abdominal or flank pain. He does report discomfort when he is passing the clot. He denies any fevers or chills.   The history is provided by the patient.  Hematuria  This is a new problem. The current episode started 2 days ago. The problem has been gradually worsening. Pertinent negatives include no abdominal pain. Exacerbated by: Urinating. Nothing relieves the symptoms. He has tried nothing for the symptoms.    Past Medical History:  Diagnosis Date  . AAA (abdominal aortic aneurysm) ([place]) 2004   s/p repair 2004; 4.3 cm infrarenal in 05/2011  . Abnormality of gait 02/23/2013  . Adenocarcinoma of right lung ([place]) 02/24/2011   Ct A/P 2012:  2cm lung mass RLL PET 2229:  Hypermetabolic RLL mass, no other hypermetabolic areas. TTNA 02/2011:  Adenocarcinoma, markers c/w lung origin Right lower lobe superior segmentectomy. 04/01/2011 Dr. Arlyce Dice   . Adenocarcinoma, lung ([place]) 01/2011   transthoracic FNA; resection of the superior segment of the RLL in 03/2011; negative nodes; no chemotherapy nor radiation planned  . Arm fracture    right arm  . Arteriosclerotic cardiovascular disease (ASCVD) 1973, 12/2010   S/P NSTEMI secondary to distal RCA/PL lesion, tx medically.  EF of  55%-60% per  echo.  . Benign prostatic hypertrophy    s/p transurethral resection of the prostate  . Bilateral renal masses    Cystic, more prominent on CT in 12/2010 than 2007; followed by  Dr. Rosana Hoes  . Bladder cancer [facility]) 1996   Transurethral resection of the bladder + chemotherapy/BCG as premed  . Cataract   . Chronic kidney disease    Creatinine 1.4 on discharge 12/20/2100; proteinuria; normal renal ultrasound in 2010; recent creatinines of 1.7-2.; Bilateral cystic renal masses by CT in 2011  . COPD (chronic obstructive pulmonary disease) ([place])   . Coronary artery disease   . Cough    thick phlegm  . Diabetes mellitus    Type II  . Diplopia 02/23/2013  . Diverticulosis   . Essential and other specified forms of tremor 02/23/2013  . Hx of Clostridium difficile infection   . Hyperlipidemia   . Hypertension   . Insomnia   . ITP (idiopathic thrombocytopenic purpura) 09/07/2012   Chronic ITP of adults versus medication-induced ITP.  Stable  . Myocardial infarction   . Nephrolithiasis 2012   ARF in 01/2011 due to obstructing nephrolithiasis  . Obesity   . OSA (obstructive sleep apnea)   . Polyneuropathy in diabetes(357.2) 02/23/2013  . Thrombocytopenia ([place])   . Tobacco abuse    50-pack-year consumption; quit in 12/2010  . Tubular adenoma of colon   . Ventral hernia     Patient Active Problem List   Diagnosis Date Noted  . Stage 4 chronic kidney disease ([place]) 06/30/2016  . Subdural hematoma, post-traumatic ([place]) 03/21/2016  . Subdural hematoma ([place]) 03/21/2016  . Malignant neoplasm of trigone of urinary bladder ([place]) 02/12/2016  . Bilirubinemia 12/12/2015  .  Thrombocytopenia ([place]) 11/13/2015  . Cough 10/03/2015  . Allergic rhinitis 10/03/2015  . Secondary hyperparathyroidism ([place]) 09/06/2015  . Tremor, essential 08/27/2015  . History of colonic polyps   . Diverticulosis of colon without hemorrhage   . Malignant neoplasm of lateral wall of urinary bladder ([place]) 01/12/2014  . CIS (carcinoma in situ of bladder) 08/22/2013  . Static tremor 02/23/2013  . Diplopia 02/23/2013  . Diabetic polyneuropathy ([place]) 02/23/2013  . Abnormality of gait 02/23/2013  . COPD  exacerbation ([place]) 01/31/2013  . Fracture of right distal radius 12/14/2012  . Diabetes mellitus ([place]) 11/30/2012  . Idiopathic thrombocytopenic purpura ([place]) 09/07/2012  . Depression 07/28/2012  . Benign prostate hyperplasia 06/10/2012  . LLQ pain 11/13/2011  . Hernia of anterior abdominal wall 10/28/2011  . AAA (abdominal aortic aneurysm) ([place])   . Tobacco abuse   . Diarrhea 10/22/2011  . Tubular adenoma of colon   . Bladder neck obstruction 06/05/2011  . Obstructive sleep apnea on CPAP 03/27/2011  . Fasting hyperglycemia 03/27/2011  . Recurrent nephrolithiasis 03/27/2011  . Carcinoma of bladder ([place]) 03/27/2011  . COPD (chronic obstructive pulmonary disease) ([place]) 02/24/2011  . Adenocarcinoma of right lung ([place]) 02/24/2011  . CAD (coronary artery disease) 01/06/2011  . Hypertension 01/06/2011  . Chronic kidney disease, stage III (moderate) 01/06/2011    Past Surgical History:  Procedure Laterality Date  . ABDOMINAL AORTIC ANEURYSM REPAIR  2004  . CARDIAC CATHETERIZATION    . COLONOSCOPY  03/19/2010   Dr. Gala Romney -(poor prep) Anal papilla, rectal hyperplastic polyp, tubular adenoma removed splenic flexure, left-sided diverticula  . COLONOSCOPY  11/28/2004   RMR:  Diminutive rectal and left colon polyps as described above, cold  biopsied/removed/  Left sided diverticula. The remainder of the colonic mucosa appeared normal.  . COLONOSCOPY   09/15/01   RMR: Multiple diminutive polyps destroyed with dermolysis as described above/ Multiple small polyps on stalks in the colon resected with snare cautery/ Scattered pan colonic diverticulum/ The remainder of the colonic mucosa appeared normal  . COLONOSCOPY N/A 05/01/2015   Procedure: COLONOSCOPY;  Surgeon: Daneil Dolin, MD;  [facility];  Service: Endoscopy;  Laterality: N/A;  1115  . CYSTECTOMY    . CYSTOSCOPY  04/2014  . CYSTOSTOMY W/ BLADDER BIOPSY    . FLEXIBLE SIGMOIDOSCOPY  2014   Dr. Olevia Perches: tubular adenoma,  negative stool studies   . LUNG LOBECTOMY    . TONSILLECTOMY    . TRANSURETHRAL RESECTION OF PROSTATE    . VIDEO BRONCHOSCOPY WITH ENDOBRONCHIAL NAVIGATION N/A 10/10/2013   Procedure: VIDEO BRONCHOSCOPY WITH ENDOBRONCHIAL NAVIGATION;  Surgeon: Melrose Nakayama, MD;  Location: [facility];  Service: Thoracic;  Laterality: N/A;  NO BLOOD THINNERS BUT PATIENT HAS ITP  . WEDGE RESECTION  04/2011   carcinoma of lung       Home Medications    Prior to Admission medications   Medication Sig Start Date End Date Taking? Authorizing Provider  acetaminophen (TYLENOL) 500 MG tablet Take 500-1,000 mg by mouth at bedtime.   Yes Historical Provider, MD  albuterol (PROAIR HFA) 108 (90 BASE) MCG/ACT inhaler Inhale 2 puffs into the lungs every 6 (six) hours as needed for wheezing. 11/15/14 05/06/18 Yes Mikey Kirschner, MD  ALPRAZolam Duanne Moron) 0.5 MG tablet TAKE ONE TABLET TWICE DAILY AS NEEDED FOR ANXIETY 06/20/16  Yes Mikey Kirschner, MD  aspirin EC 81 MG tablet Take 81 mg by mouth daily.   Yes Historical Provider, MD  cefpodoxime (VANTIN) 200 MG tablet  Take 100 mg by mouth every evening.  08/22/16  Yes Historical Provider, MD  cetirizine (ZYRTEC) 10 MG tablet Take 1 tablet (10 mg total) by mouth daily. 11/15/14  Yes Mikey Kirschner, MD  citalopram (CELEXA) 20 MG tablet TAKE ONE (1) TABLET BY MOUTH EVERY DAY 08/26/16  Yes Mikey Kirschner, MD  fluticasone [facility]) 50 MCG/ACT nasal spray Place 1 spray into both nostrils daily. 03/24/16  Yes Ripudeep Krystal Eaton, MD  metoprolol (LOPRESSOR) 50 MG tablet Take 1/2 tablet by mouth twice daily 06/17/16  Yes Mikey Kirschner, MD  niacin (NIASPAN) 500 MG CR tablet Take 2 tablets (1,000 mg total) by mouth at bedtime. 11/15/14  Yes Mikey Kirschner, MD  nitroGLYCERIN (NITROSTAT) 0.4 MG SL tablet Place 1 tablet (0.4 mg total) under the tongue every 5 (five) minutes as needed. Call MD if need more than 2 Patient taking differently: Place 0.4 mg under the tongue every 5 (five) minutes  as needed for chest pain. Call MD if need more than 2 04/18/16  Yes Mikey Kirschner, MD  pravastatin (PRAVACHOL) 20 MG tablet Take 20 mg by mouth at bedtime.    Yes Historical Provider, MD  Probiotic Product (PROBIOTIC PEARLS ADVANTAGE) CAPS Take 1 capsule by mouth daily.   Yes Historical Provider, MD  tiotropium (SPIRIVA HANDIHALER) 18 MCG inhalation capsule Place 1 capsule (18 mcg total) into inhaler and inhale daily. 09/25/16  Yes Juanito Doom, MD  vitamin B-12 1000 MCG tablet Take 1 tablet (1,000 mcg total) by mouth daily. 03/24/16  Yes Ripudeep Krystal Eaton, MD    Family History Family History  Problem Relation Age of Onset  . Aortic aneurysm Mother   . Emphysema Father     smoker  . Clotting disorder Father   . Arthritis Father   . Hypertension Father   . Diabetes Father   . Aortic aneurysm Father   . Tremor Father   . Stroke Paternal Grandmother   . Other Paternal Grandfather     brain aneurysm  . Colon cancer Cousin     Social History Social History  Substance Use Topics  . Smoking status: Current Every Day Smoker    Packs/day: 1.00    Years: 50.00    Types: Cigarettes    Start date: 07/22/1955    Last attempt to quit: 12/19/2012  . Smokeless tobacco: Never Used  . Alcohol use No     Allergies   Codeine and Etodolac   Review of Systems Review of Systems  Gastrointestinal: Negative for abdominal pain.  Genitourinary: Positive for hematuria.  All other systems reviewed and are negative.    Physical Exam Updated Vital Signs BP 138/76   Pulse 77   Temp 98.1 F (36.7 C) (Oral)   Resp 18   SpO2 94%   Physical Exam  Constitutional: He is oriented to person, place, and time. He appears well-developed and well-nourished. No distress.  HENT:  Head: Normocephalic and atraumatic.  Mouth/Throat: Oropharynx is clear and moist.  Neck: Normal range of motion. Neck supple.  Cardiovascular: Normal rate and regular rhythm.  Exam reveals no friction rub.   No murmur  heard. Pulmonary/Chest: Effort normal and breath sounds normal. No respiratory distress. He has no wheezes. He has no rales.  Abdominal: Soft. Bowel sounds are normal. He exhibits no distension. There is no tenderness.  Musculoskeletal: Normal range of motion. He exhibits no edema.  Neurological: He is alert and oriented to person, place, and time. Coordination normal.  Skin: Skin is warm and dry. He is not diaphoretic.  Nursing note and vitals reviewed.    ED Treatments / Results  Labs (all labs ordered are listed, but only abnormal results are displayed) Labs Reviewed  CBC WITH DIFFERENTIAL/PLATELET - Abnormal; Notable for the following:       Result Value   RDW 17.5 (*)    All other components within normal limits  URINALYSIS, ROUTINE W REFLEX MICROSCOPIC  COMPREHENSIVE METABOLIC PANEL    EKG  EKG Interpretation None       Radiology No results found.  Procedures Procedures (including critical care time)  Medications Ordered in ED Medications - No data to display   Initial Impression / Assessment and Plan / ED Course  I have reviewed the triage vital signs and the nursing notes.  Pertinent labs & imaging results that were available during my care of the patient were reviewed by me and considered in my medical decision making (see chart for details).  Patient presents here with complaints of hematuria and passing a clot through his urethra. He has a history of bladder cancer. Today's CT scan shows hydronephrosis of the right kidney and what appears to be a clot versus mass in the distal right ureter. I have discussed this finding with Dr. Eulogio Ditch from urology. He is recommending follow-up in the urology office in the next 2-3 days.  There is no evidence for infection in his urine and creatinine is 2.8, elevated but consistent with baseline.  He sees Dr. Rosana Hoes, his urologist at [facility]. And is to follow-up with him in the next few days.  Final Clinical  Impressions(s) / ED Diagnoses   Final diagnoses:  None    New Prescriptions New Prescriptions   No medications on file     Veryl Speak, MD 09/29/16 2102

## 2016-09-29 NOTE — Progress Notes (Signed)
SUBJECTIVE: The patient presents for routine cardiovascular follow-up. He has a history of coronary artery disease which has been medically managed since a non-STEMI in June 2012. At that time, coronary angiography revealed a 99% stenosis in the distal RCA at the ostium of the second postero-lateral branch which was small and not amenable to percutaneous intervention. 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 There was a long diffuse 70-80% lesion around the proximal bend and was deemed to be a very tortuous artery. The LAD had an ostial 20% lesion in the mid to distal LAD was diffusely diseased with a 50% stenosis. In the proximal portion of the first diagonal there was a 70% stenosis.  He also has a history of AAA s/p repair, CKD stage III, bladder cancer, essential hypertension, diabetes, essential tremor, and lung cancer.  He has been doing well and denies chest pain, activity limiting exertional dyspnea, leg swelling, palpitations, dizziness, and syncope.  Had some shooting right arm pains 4-5 weeks ago but no chest pain.  Soc: Studied for 2 years at [employer] and then worked for [employer] x 31 years and retired. His son attended [place] [employer].   Review of Systems: As per "subjective", otherwise negative.  Allergies  Allergen Reactions  . Codeine Anaphylaxis  . Etodolac     dizziness    Current Outpatient Prescriptions  Medication Sig Dispense Refill  . ACCU-CHEK AVIVA PLUS test strip USE AS DIRECTED TO TEST ONCE DAILY. 50 each 5  . albuterol (PROAIR HFA) 108 (90 BASE) MCG/ACT inhaler Inhale 2 puffs into the lungs every 6 (six) hours as needed for wheezing. 1 Inhaler 5  . ALPRAZolam (XANAX) 0.5 MG tablet TAKE ONE TABLET TWICE DAILY AS NEEDED FOR ANXIETY 60 tablet 5  . aspirin EC 81 MG tablet Take 81 mg by mouth daily.    . cetirizine (ZYRTEC) 10 MG tablet Take 1 tablet (10 mg  total) by mouth daily. 30 tablet 5  . citalopram (CELEXA) 20 MG tablet TAKE ONE (1) TABLET BY MOUTH EVERY DAY 30 tablet 11  . Cyanocobalamin (RA VITAMIN B-12 TR) 1000 MCG TBCR Take by mouth.    . fluticasone (FLONASE) 50 MCG/ACT nasal spray Place 1 spray into both nostrils daily. 16 g 2  . ipratropium (ATROVENT) 0.06 % nasal spray Place 2 sprays into both nostrils 4 (four) times daily. 15 mL 5  . ipratropium (ATROVENT) 0.06 % nasal spray     . metoprolol (LOPRESSOR) 50 MG tablet Take 1/2 tablet by mouth twice daily 30 tablet 5  . niacin (NIASPAN) 500 MG CR tablet Take 2 tablets (1,000 mg total) by mouth at bedtime. 60 tablet 5  . nitroGLYCERIN (NITROSTAT) 0.4 MG SL tablet Place 1 tablet (0.4 mg total) under the tongue every 5 (five) minutes as needed. Call MD if need more than 2 (Patient taking differently: Place 0.4 mg under the tongue every 5 (five) minutes as needed for chest pain. Call MD if need more than 2) 3 tablet 0  . pravastatin (PRAVACHOL) 20 MG tablet Take 20 mg by mouth at bedtime.     . Probiotic Product (ALIGN PO) Take 1 capsule by mouth daily.    Marland Kitchen tiotropium (SPIRIVA HANDIHALER) 18 MCG inhalation capsule Place 1 capsule (18 mcg total) into inhaler and inhale daily. 90 capsule 3  . vitamin B-12 1000 MCG tablet Take 1 tablet (1,000  mcg total) by mouth daily.     No current facility-administered medications for this visit.     Past Medical History:  Diagnosis Date  . AAA (abdominal aortic aneurysm) ([place]) 2004   s/p repair 2004; 4.3 cm infrarenal in 05/2011  . Abnormality of gait 02/23/2013  . Adenocarcinoma of right lung ([place]) 02/24/2011   Ct A/P 2012:  2cm lung mass RLL PET 9622:  Hypermetabolic RLL mass, no other hypermetabolic areas. TTNA 02/2011:  Adenocarcinoma, markers c/w lung origin Right lower lobe superior segmentectomy. 04/01/2011 Dr. Arlyce Dice   . Adenocarcinoma, lung ([place]) 01/2011   transthoracic FNA; resection of the superior segment of the RLL in 03/2011; negative nodes; no  chemotherapy nor radiation planned  . Arm fracture    right arm  . Arteriosclerotic cardiovascular disease (ASCVD) 1973, 12/2010   S/P NSTEMI secondary to distal RCA/PL lesion, tx medically.  EF of  55%-60% per  echo.  . Benign prostatic hypertrophy    s/p transurethral resection of the prostate  . Bilateral renal masses    Cystic, more prominent on CT in 12/2010 than 2007; followed by Dr. Rosana Hoes  . Bladder cancer [facility]) 1996   Transurethral resection of the bladder + chemotherapy/BCG as premed  . Cataract   . Chronic kidney disease    Creatinine 1.4 on discharge 12/20/2100; proteinuria; normal renal ultrasound in 2010; recent creatinines of 1.7-2.; Bilateral cystic renal masses by CT in 2011  . COPD (chronic obstructive pulmonary disease) ([place])   . Coronary artery disease   . Cough    thick phlegm  . Diabetes mellitus    Type II  . Diplopia 02/23/2013  . Diverticulosis   . Essential and other specified forms of tremor 02/23/2013  . Hx of Clostridium difficile infection   . Hyperlipidemia   . Hypertension   . Insomnia   . ITP (idiopathic thrombocytopenic purpura) 09/07/2012   Chronic ITP of adults versus medication-induced ITP.  Stable  . Myocardial infarction   . Nephrolithiasis 2012   ARF in 01/2011 due to obstructing nephrolithiasis  . Obesity   . OSA (obstructive sleep apnea)   . Polyneuropathy in diabetes(357.2) 02/23/2013  . Thrombocytopenia ([place])   . Tobacco abuse    50-pack-year consumption; quit in 12/2010  . Tubular adenoma of colon   . Ventral hernia     Past Surgical History:  Procedure Laterality Date  . ABDOMINAL AORTIC ANEURYSM REPAIR  2004  . CARDIAC CATHETERIZATION    . COLONOSCOPY  03/19/2010   Dr. Gala Romney -(poor prep) Anal papilla, rectal hyperplastic polyp, tubular adenoma removed splenic flexure, left-sided diverticula  . COLONOSCOPY  11/28/2004   RMR:  Diminutive rectal and left colon polyps as described above, cold  biopsied/removed/  Left sided diverticula. The  remainder of the colonic mucosa appeared normal.  . COLONOSCOPY   09/15/01   RMR: Multiple diminutive polyps destroyed with dermolysis as described above/ Multiple small polyps on stalks in the colon resected with snare cautery/ Scattered pan colonic diverticulum/ The remainder of the colonic mucosa appeared normal  . COLONOSCOPY N/A 05/01/2015   Procedure: COLONOSCOPY;  Surgeon: Daneil Dolin, MD;  [facility];  Service: Endoscopy;  Laterality: N/A;  1115  . CYSTECTOMY    . CYSTOSCOPY  04/2014  . CYSTOSTOMY W/ BLADDER BIOPSY    . FLEXIBLE SIGMOIDOSCOPY  2014   Dr. Olevia Perches: tubular adenoma, negative stool studies   . LUNG LOBECTOMY    . TONSILLECTOMY    . TRANSURETHRAL RESECTION OF PROSTATE    .  VIDEO BRONCHOSCOPY WITH ENDOBRONCHIAL NAVIGATION N/A 10/10/2013   Procedure: VIDEO BRONCHOSCOPY WITH ENDOBRONCHIAL NAVIGATION;  Surgeon: Melrose Nakayama, MD;  Location: [facility];  Service: Thoracic;  Laterality: N/A;  NO BLOOD THINNERS BUT PATIENT HAS ITP  . WEDGE RESECTION  04/2011   carcinoma of lung    Social History   Social History  . Marital status: Married    Spouse name: [name]   . Number of children: 2  . Years of education: 12+   Occupational History  . Retired  [employer]   Social History Main Topics  . Smoking status: Current Every Day Smoker    Packs/day: 1.00    Years: 50.00    Types: Cigarettes    Start date: 07/22/1955    Last attempt to quit: 12/19/2012  . Smokeless tobacco: Never Used  . Alcohol use No  . Drug use: No  . Sexual activity: No   Other Topics Concern  . Not on file   Social History Narrative   Patient lives at home with his wife [name].    Patient has 2 children.    Patient is retired.    Patient is right handed.    Patient has 2 years of college.    3-4 cups of caffeine daily.        Vitals:   09/29/16 1335  BP: 130/76  Pulse: 72  SpO2: 94%  Weight: 269 lb (122 kg)  Height: 6' (1.829 m)    PHYSICAL EXAM General:  NAD HEENT: Normal. Neck: No JVD, no thyromegaly. Lungs: Diminished but clear, no wheezes or rales. CV: Nondisplaced PMI. Distant heart tones. Regular rate and rhythm, normal S1/S2, no S3/S4, no murmur. No pretibial or periankle edema. No carotid bruit.  Abdomen: Obese, +ventral hernia.  Neurologic: Alert and oriented x 3.  Psych: Normal affect. Skin: Normal. Musculoskeletal: No gross deformities. Extremities: No clubbing or cyanosis.     ECG: Most recent ECG reviewed.      ASSESSMENT AND PLAN:  1. CAD: Stable ischemic heart disease. Continue ASA, metoprolol 25 mg bid, and pravastatin.  2. Essential hypertension: Well controlled. No changes. No longer on ACEI.  3. Hyperlipidemia: 02/22/16 lipid panel showed TC 145, TG 230, HDL 30, LDL 69. Continue present dose of statin therapy (pravastatin 20 mg).  4. AAA s/p repair: Stable.  Dispo: f/u 1 year.  Kate Sable, M.D., F.A.C.C.

## 2016-09-29 NOTE — Patient Instructions (Signed)
Your physician wants you to follow-up in:  1 year with Dr Koneswaran You will receive a reminder letter in the mail two months in advance. If you don't receive a letter, please call our office to schedule the follow-up appointment.    Your physician recommends that you continue on your current medications as directed. Please refer to the Current Medication list given to you today.    If you need a refill on your cardiac medications before your next appointment, please call your pharmacy.     Thank you for choosing [facility] !        

## 2016-09-30 ENCOUNTER — Other Ambulatory Visit: Payer: Self-pay | Admitting: *Deleted

## 2016-09-30 NOTE — Patient Outreach (Signed)
[facility]) Care Management  09/30/2016  Kyle [name] 02/23/1934 [id number]   [name] presented to the [facility] ED late yesterday afternoon when he began having pain, bleeding, and passage of blood clots during urination. [name] called his urologists office who recommended that he present to the local ED.  In the ED, [name] was evaluated. Creatinine was at baseline and there was no evidence of infection. Mr Kyle [name] underwent a CT scan which showed "hydronephrosis of the right kidney and what appears to be a clot versus mass in the distal right ureter". Urology was consulted in the ED and the recommendation was for follow up with primary urologist in the next 2-3 days.   I spoke with Kyle [name] this morning and she is planning to call the office of Dr. Lawerance Bach first thing this morning to request an appointment as directed.   Plan: I will follow up with Kyle [name] no later than the end of this week to assess his progress and assist with any care coordination needs.    [facility]  [phone]

## 2016-10-01 DIAGNOSIS — C679 Malignant neoplasm of bladder, unspecified: Secondary | ICD-10-CM | POA: Diagnosis not present

## 2016-10-01 DIAGNOSIS — R319 Hematuria, unspecified: Secondary | ICD-10-CM | POA: Diagnosis not present

## 2016-10-03 ENCOUNTER — Other Ambulatory Visit: Payer: Self-pay | Admitting: *Deleted

## 2016-10-03 NOTE — Patient Outreach (Signed)
[facility]) Care Management  10/03/2016  LC JOYNT 24-Dec-1933 [id number]  I called Mr. Alban this afternoon to follow up on urinary symptoms. He reported to the ED earlier in the week when he experienced urinary urgency and bleeding with clots when he urinated. Mr. Turnley followed up with his primary urologist, Dr. Tresa Endo, at [facility]. Dr. Rosana Hoes has scheduled Mr. Sissel for "some kind of procedure where he has to put me to sleep to do it" on 10/21/16. Mr. Tomasello anticipates a call from Dr. Rosana Hoes' office for more information and instruction about the procedure.   Plan: I will follow up with Mr. Kappes by phone and have advised him to call his provider for new or worsened symptoms.    [place] [facility]  [phone]

## 2016-10-09 DIAGNOSIS — G4733 Obstructive sleep apnea (adult) (pediatric): Secondary | ICD-10-CM | POA: Diagnosis not present

## 2016-10-13 DIAGNOSIS — L600 Ingrowing nail: Secondary | ICD-10-CM | POA: Diagnosis not present

## 2016-10-13 DIAGNOSIS — B351 Tinea unguium: Secondary | ICD-10-CM | POA: Diagnosis not present

## 2016-10-13 DIAGNOSIS — E1140 Type 2 diabetes mellitus with diabetic neuropathy, unspecified: Secondary | ICD-10-CM | POA: Diagnosis not present

## 2016-10-13 DIAGNOSIS — E1151 Type 2 diabetes mellitus with diabetic peripheral angiopathy without gangrene: Secondary | ICD-10-CM | POA: Diagnosis not present

## 2016-10-17 DIAGNOSIS — J449 Chronic obstructive pulmonary disease, unspecified: Secondary | ICD-10-CM | POA: Diagnosis not present

## 2016-10-20 ENCOUNTER — Encounter: Payer: Self-pay | Admitting: Family Medicine

## 2016-10-20 ENCOUNTER — Other Ambulatory Visit (HOSPITAL_COMMUNITY)
Admission: RE | Admit: 2016-10-20 | Discharge: 2016-10-20 | Disposition: A | Discharge status: Home / Self Care | Payer: Medicare Other | Source: Ambulatory Visit | Attending: Family Medicine | Admitting: Family Medicine

## 2016-10-20 ENCOUNTER — Ambulatory Visit (INDEPENDENT_AMBULATORY_CARE_PROVIDER_SITE_OTHER): Payer: Medicare Other | Admitting: Family Medicine

## 2016-10-20 ENCOUNTER — Other Ambulatory Visit (HOSPITAL_COMMUNITY)
Admission: RE | Admit: 2016-10-20 | Discharge: 2016-10-20 | Disposition: A | Discharge status: Home / Self Care | Payer: Medicare Other | Source: Ambulatory Visit | Attending: Internal Medicine | Admitting: Internal Medicine

## 2016-10-20 VITALS — BP 130/82 | Ht 72.0 in | Wt 269.8 lb

## 2016-10-20 DIAGNOSIS — D631 Anemia in chronic kidney disease: Secondary | ICD-10-CM | POA: Diagnosis not present

## 2016-10-20 DIAGNOSIS — I10 Essential (primary) hypertension: Secondary | ICD-10-CM

## 2016-10-20 DIAGNOSIS — E785 Hyperlipidemia, unspecified: Secondary | ICD-10-CM | POA: Diagnosis not present

## 2016-10-20 DIAGNOSIS — N2581 Secondary hyperparathyroidism of renal origin: Secondary | ICD-10-CM | POA: Diagnosis not present

## 2016-10-20 DIAGNOSIS — N200 Calculus of kidney: Secondary | ICD-10-CM | POA: Diagnosis not present

## 2016-10-20 DIAGNOSIS — G4733 Obstructive sleep apnea (adult) (pediatric): Secondary | ICD-10-CM | POA: Diagnosis not present

## 2016-10-20 DIAGNOSIS — N184 Chronic kidney disease, stage 4 (severe): Secondary | ICD-10-CM | POA: Insufficient documentation

## 2016-10-20 DIAGNOSIS — E119 Type 2 diabetes mellitus without complications: Secondary | ICD-10-CM | POA: Diagnosis not present

## 2016-10-20 LAB — COMPREHENSIVE METABOLIC PANEL
ALT: 12 U/L — AB (ref 17–63)
AST: 14 U/L — ABNORMAL LOW (ref 15–41)
Albumin: 3.6 g/dL (ref 3.5–5.0)
Alkaline Phosphatase: 56 U/L (ref 38–126)
Anion gap: 6 (ref 5–15)
BUN: 34 mg/dL — ABNORMAL HIGH (ref 6–20)
CHLORIDE: 111 mmol/L (ref 101–111)
CO2: 25 mmol/L (ref 22–32)
CREATININE: 2.79 mg/dL — AB (ref 0.61–1.24)
Calcium: 9.1 mg/dL (ref 8.9–10.3)
GFR calc Af Amer: 23 mL/min — ABNORMAL LOW (ref >60)
GFR, EST NON AFRICAN AMERICAN: 20 mL/min — AB (ref >60)
Glucose, Bld: 184 mg/dL — ABNORMAL HIGH (ref 65–99)
Potassium: 4.2 mmol/L (ref 3.5–5.1)
SODIUM: 142 mmol/L (ref 135–145)
Total Bilirubin: 1.2 mg/dL (ref 0.3–1.2)
Total Protein: 6.6 g/dL (ref 6.5–8.1)

## 2016-10-20 LAB — HEPATIC FUNCTION PANEL
ALT: 12 U/L — AB (ref 17–63)
AST: 12 U/L — ABNORMAL LOW (ref 15–41)
Albumin: 3.6 g/dL (ref 3.5–5.0)
Alkaline Phosphatase: 56 U/L (ref 38–126)
BILIRUBIN INDIRECT: 1.2 mg/dL — AB (ref 0.3–0.9)
Bilirubin, Direct: 0.1 mg/dL (ref 0.1–0.5)
TOTAL PROTEIN: 6.7 g/dL (ref 6.5–8.1)
Total Bilirubin: 1.3 mg/dL — ABNORMAL HIGH (ref 0.3–1.2)

## 2016-10-20 LAB — VITAMIN B12: Vitamin B-12: 926 pg/mL — ABNORMAL HIGH (ref 180–914)

## 2016-10-20 LAB — CBC WITH DIFFERENTIAL/PLATELET
BASOS ABS: 0 10*3/uL (ref 0.0–0.1)
Basophils Relative: 0 %
EOS PCT: 2 %
Eosinophils Absolute: 0.1 10*3/uL (ref 0.0–0.7)
HCT: 39.6 % (ref 39.0–52.0)
Hemoglobin: 12.9 g/dL — ABNORMAL LOW (ref 13.0–17.0)
LYMPHS PCT: 20 %
Lymphs Abs: 1.1 10*3/uL (ref 0.7–4.0)
MCH: 31.2 pg (ref 26.0–34.0)
MCHC: 32.6 g/dL (ref 30.0–36.0)
MCV: 95.9 fL (ref 78.0–100.0)
Monocytes Absolute: 0.4 10*3/uL (ref 0.1–1.0)
Monocytes Relative: 7 %
Neutro Abs: 4 10*3/uL (ref 1.7–7.7)
Neutrophils Relative %: 71 %
PLATELETS: 75 10*3/uL — AB (ref 150–400)
RBC: 4.13 MIL/uL — AB (ref 4.22–5.81)
RDW: 16.8 % — ABNORMAL HIGH (ref 11.5–15.5)
WBC: 5.6 10*3/uL (ref 4.0–10.5)

## 2016-10-20 LAB — URINALYSIS, ROUTINE W REFLEX MICROSCOPIC
BILIRUBIN URINE: NEGATIVE
GLUCOSE, UA: 150 mg/dL — AB
KETONES UR: NEGATIVE mg/dL
Nitrite: NEGATIVE
Specific Gravity, Urine: 1.016 (ref 1.005–1.030)
pH: 5 (ref 5.0–8.0)

## 2016-10-20 LAB — IRON AND TIBC
Iron: 64 ug/dL (ref 45–182)
SATURATION RATIOS: 23 % (ref 17.9–39.5)
TIBC: 283 ug/dL (ref 250–450)
UIBC: 219 ug/dL

## 2016-10-20 LAB — URIC ACID: URIC ACID, SERUM: 8.2 mg/dL — AB (ref 4.4–7.6)

## 2016-10-20 LAB — LIPID PANEL
Cholesterol: 139 mg/dL (ref 0–200)
HDL: 28 mg/dL — AB (ref >40)
LDL Cholesterol: 65 mg/dL (ref 0–99)
Total CHOL/HDL Ratio: 5 RATIO
Triglycerides: 229 mg/dL — ABNORMAL HIGH (ref <150)
VLDL: 46 mg/dL — AB (ref 0–40)

## 2016-10-20 LAB — PROTEIN / CREATININE RATIO, URINE
CREATININE, URINE: 137.47 mg/dL
PROTEIN CREATININE RATIO: 2.56 mg/mg{creat} — AB (ref 0.00–0.15)
TOTAL PROTEIN, URINE: 352 mg/dL

## 2016-10-20 LAB — POCT GLYCOSYLATED HEMOGLOBIN (HGB A1C): Hemoglobin A1C: 5.5

## 2016-10-20 LAB — MAGNESIUM: MAGNESIUM: 1.9 mg/dL (ref 1.7–2.4)

## 2016-10-20 LAB — FERRITIN: Ferritin: 197 ng/mL (ref 24–336)

## 2016-10-20 LAB — PHOSPHORUS: PHOSPHORUS: 2.7 mg/dL (ref 2.5–4.6)

## 2016-10-20 MED ORDER — CETIRIZINE HCL 10 MG PO TABS: 10.0000 mg | ORAL_TABLET | Freq: Every day | ORAL | 5 refills | Status: DC

## 2016-10-20 MED ORDER — NIACIN ER (ANTIHYPERLIPIDEMIC) 500 MG PO TBCR: 1000.0000 mg | EXTENDED_RELEASE_TABLET | Freq: Every day | ORAL | 5 refills | Status: DC

## 2016-10-20 MED ORDER — ALPRAZOLAM 0.5 MG PO TABS: ORAL_TABLET | ORAL | 5 refills | Status: DC

## 2016-10-20 MED ORDER — CITALOPRAM HYDROBROMIDE 20 MG PO TABS: ORAL_TABLET | ORAL | 5 refills | Status: DC

## 2016-10-20 MED ORDER — METOPROLOL TARTRATE 50 MG PO TABS: ORAL_TABLET | ORAL | 5 refills | Status: DC

## 2016-10-20 NOTE — Progress Notes (Signed)
   Subjective:    Patient ID: Kyle [name], male    DOB: 06/29/1934, 83 y.o.   MRN: [id number]  Diabetes  He presents for his follow-up diabetic visit. He has type 2 diabetes mellitus. Risk factors for coronary artery disease include diabetes mellitus, dyslipidemia, hypertension, obesity, sedentary lifestyle and tobacco exposure. Current diabetic treatment includes diet. He is compliant with treatment all of the time. His weight is stable. He is following a diabetic diet. He has not had a previous visit with a dietitian. He sees a podiatrist.Eye exam is not current.   Results for orders placed or performed in visit on 10/20/16  POCT glycosylated hemoglobin (Hb A1C)  Result Value Ref Range   Hemoglobin A1C 5.5     Patient continues to take lipid medication regularly. No obvious side effects from it. Generally does not miss a dose. Prior blood work results are reviewed with patient. Patient continues to work on fat intake in diet  Blood pressure medicine and blood pressure levels reviewed today with patient. Compliant with blood pressure medicine. States does not miss a dose. No obvious side effects. Blood pressure generally good when checked elsewhere. Watching salt intake.  Patient notes ongoing compliance with antidepressant medication. No obvious side effects. Reports does not miss a dose. Overall continues to help depression substantially. No thoughts of homicide or suicide. Would like to maintain medication.  Patient claims compliance with diabetes medication. No obvious side effects. Reports no substantial low sugar spells. Most numbers are generally in good range when checked fasting. Generally does not miss a dose of medication. Watching diabetic diet closely  PT helped a great deal, pt trying to cont exrcise  Sugars overall good  142   No low spells   Review of Systems No headache, no major weight loss or weight gain, no chest pain no back pain abdominal pain no change in bowel  habits complete ROS otherwise negative     Objective:   Physical Exam  Alert and oriented, vitals reviewed and stable, NAD ENT-TM's and ext canals WNL bilat via otoscopic exam Soft palate, tonsils and post pharynx WNL via oropharyngeal exam Neck-symmetric, no masses; thyroid nonpalpable and nontender Pulmonary-no tachypnea or accessory muscle use; Clear without wheezes via auscultation Card--no abnrml murmurs, rhythm reg and rate WNL Carotid pulses symmetric, without bruits       Assessment & Plan:  Impression 1 type 2 diabetes excellent control discussed maintain efforts #2 hyperlipidemia prior blood work reviewed current status uncertain discuss need to check #3 depression clinically stable to maintain same meds also uses Xanax for sleep at night #4 COPD patient claims breathing fairly stable at this time. Rare use of inhaler #5 hypertension stable good control on current meds plan appropriate blood work. Medications refilled. Diet exercise discussed. Recheck in several months WSL

## 2016-10-21 DIAGNOSIS — C679 Malignant neoplasm of bladder, unspecified: Secondary | ICD-10-CM | POA: Diagnosis not present

## 2016-10-21 DIAGNOSIS — Z9989 Dependence on other enabling machines and devices: Secondary | ICD-10-CM | POA: Diagnosis not present

## 2016-10-21 DIAGNOSIS — Z794 Long term (current) use of insulin: Secondary | ICD-10-CM | POA: Diagnosis not present

## 2016-10-21 DIAGNOSIS — J449 Chronic obstructive pulmonary disease, unspecified: Secondary | ICD-10-CM | POA: Diagnosis not present

## 2016-10-21 DIAGNOSIS — Z7901 Long term (current) use of anticoagulants: Secondary | ICD-10-CM | POA: Diagnosis not present

## 2016-10-21 DIAGNOSIS — E1122 Type 2 diabetes mellitus with diabetic chronic kidney disease: Secondary | ICD-10-CM | POA: Diagnosis not present

## 2016-10-21 DIAGNOSIS — G4733 Obstructive sleep apnea (adult) (pediatric): Secondary | ICD-10-CM | POA: Diagnosis not present

## 2016-10-21 DIAGNOSIS — I129 Hypertensive chronic kidney disease with stage 1 through stage 4 chronic kidney disease, or unspecified chronic kidney disease: Secondary | ICD-10-CM | POA: Diagnosis not present

## 2016-10-21 DIAGNOSIS — N289 Disorder of kidney and ureter, unspecified: Secondary | ICD-10-CM | POA: Diagnosis not present

## 2016-10-21 DIAGNOSIS — Z8551 Personal history of malignant neoplasm of bladder: Secondary | ICD-10-CM | POA: Diagnosis not present

## 2016-10-21 DIAGNOSIS — N184 Chronic kidney disease, stage 4 (severe): Secondary | ICD-10-CM | POA: Diagnosis not present

## 2016-10-21 DIAGNOSIS — Z79899 Other long term (current) drug therapy: Secondary | ICD-10-CM | POA: Diagnosis not present

## 2016-10-21 DIAGNOSIS — N281 Cyst of kidney, acquired: Secondary | ICD-10-CM | POA: Diagnosis not present

## 2016-10-21 DIAGNOSIS — Z85118 Personal history of other malignant neoplasm of bronchus and lung: Secondary | ICD-10-CM | POA: Diagnosis not present

## 2016-10-21 DIAGNOSIS — I2510 Atherosclerotic heart disease of native coronary artery without angina pectoris: Secondary | ICD-10-CM | POA: Diagnosis not present

## 2016-10-21 DIAGNOSIS — C661 Malignant neoplasm of right ureter: Secondary | ICD-10-CM | POA: Diagnosis not present

## 2016-10-21 DIAGNOSIS — N135 Crossing vessel and stricture of ureter without hydronephrosis: Secondary | ICD-10-CM | POA: Diagnosis not present

## 2016-10-21 LAB — PARATHYROID HORMONE, INTACT (NO CA): PTH: 62 pg/mL (ref 15–65)

## 2016-10-22 DIAGNOSIS — J449 Chronic obstructive pulmonary disease, unspecified: Secondary | ICD-10-CM | POA: Diagnosis not present

## 2016-10-22 DIAGNOSIS — E1122 Type 2 diabetes mellitus with diabetic chronic kidney disease: Secondary | ICD-10-CM | POA: Diagnosis not present

## 2016-10-22 DIAGNOSIS — N184 Chronic kidney disease, stage 4 (severe): Secondary | ICD-10-CM | POA: Diagnosis not present

## 2016-10-22 DIAGNOSIS — Z8551 Personal history of malignant neoplasm of bladder: Secondary | ICD-10-CM | POA: Diagnosis not present

## 2016-10-22 DIAGNOSIS — Z7901 Long term (current) use of anticoagulants: Secondary | ICD-10-CM | POA: Diagnosis not present

## 2016-10-22 DIAGNOSIS — I2510 Atherosclerotic heart disease of native coronary artery without angina pectoris: Secondary | ICD-10-CM | POA: Diagnosis not present

## 2016-10-22 DIAGNOSIS — Z9989 Dependence on other enabling machines and devices: Secondary | ICD-10-CM | POA: Diagnosis not present

## 2016-10-22 DIAGNOSIS — N289 Disorder of kidney and ureter, unspecified: Secondary | ICD-10-CM | POA: Diagnosis not present

## 2016-10-22 DIAGNOSIS — C661 Malignant neoplasm of right ureter: Secondary | ICD-10-CM | POA: Diagnosis not present

## 2016-10-22 DIAGNOSIS — Z85118 Personal history of other malignant neoplasm of bronchus and lung: Secondary | ICD-10-CM | POA: Diagnosis not present

## 2016-10-22 DIAGNOSIS — G4733 Obstructive sleep apnea (adult) (pediatric): Secondary | ICD-10-CM | POA: Diagnosis not present

## 2016-10-22 DIAGNOSIS — N135 Crossing vessel and stricture of ureter without hydronephrosis: Secondary | ICD-10-CM | POA: Diagnosis not present

## 2016-10-22 DIAGNOSIS — N281 Cyst of kidney, acquired: Secondary | ICD-10-CM | POA: Diagnosis not present

## 2016-10-22 DIAGNOSIS — I129 Hypertensive chronic kidney disease with stage 1 through stage 4 chronic kidney disease, or unspecified chronic kidney disease: Secondary | ICD-10-CM | POA: Diagnosis not present

## 2016-10-22 DIAGNOSIS — Z794 Long term (current) use of insulin: Secondary | ICD-10-CM | POA: Diagnosis not present

## 2016-10-22 DIAGNOSIS — Z79899 Other long term (current) drug therapy: Secondary | ICD-10-CM | POA: Diagnosis not present

## 2016-10-23 ENCOUNTER — Other Ambulatory Visit: Payer: Self-pay | Admitting: Licensed Clinical Social Worker

## 2016-10-23 DIAGNOSIS — I1310 Hypertensive heart and chronic kidney disease without heart failure, with stage 1 through stage 4 chronic kidney disease, or unspecified chronic kidney disease: Secondary | ICD-10-CM | POA: Diagnosis not present

## 2016-10-23 DIAGNOSIS — N135 Crossing vessel and stricture of ureter without hydronephrosis: Secondary | ICD-10-CM | POA: Diagnosis not present

## 2016-10-23 DIAGNOSIS — J439 Emphysema, unspecified: Secondary | ICD-10-CM | POA: Diagnosis not present

## 2016-10-23 DIAGNOSIS — Z8551 Personal history of malignant neoplasm of bladder: Secondary | ICD-10-CM | POA: Diagnosis not present

## 2016-10-23 DIAGNOSIS — E1122 Type 2 diabetes mellitus with diabetic chronic kidney disease: Secondary | ICD-10-CM | POA: Diagnosis not present

## 2016-10-23 DIAGNOSIS — Z7901 Long term (current) use of anticoagulants: Secondary | ICD-10-CM | POA: Diagnosis not present

## 2016-10-23 DIAGNOSIS — N1339 Other hydronephrosis: Secondary | ICD-10-CM | POA: Diagnosis not present

## 2016-10-23 DIAGNOSIS — Z79899 Other long term (current) drug therapy: Secondary | ICD-10-CM | POA: Diagnosis not present

## 2016-10-23 DIAGNOSIS — N184 Chronic kidney disease, stage 4 (severe): Secondary | ICD-10-CM | POA: Diagnosis not present

## 2016-10-23 DIAGNOSIS — I2510 Atherosclerotic heart disease of native coronary artery without angina pectoris: Secondary | ICD-10-CM | POA: Diagnosis not present

## 2016-10-23 DIAGNOSIS — I129 Hypertensive chronic kidney disease with stage 1 through stage 4 chronic kidney disease, or unspecified chronic kidney disease: Secondary | ICD-10-CM | POA: Diagnosis not present

## 2016-10-23 DIAGNOSIS — N289 Disorder of kidney and ureter, unspecified: Secondary | ICD-10-CM | POA: Diagnosis not present

## 2016-10-23 DIAGNOSIS — Z794 Long term (current) use of insulin: Secondary | ICD-10-CM | POA: Diagnosis not present

## 2016-10-23 DIAGNOSIS — Z9989 Dependence on other enabling machines and devices: Secondary | ICD-10-CM | POA: Diagnosis not present

## 2016-10-23 DIAGNOSIS — G4733 Obstructive sleep apnea (adult) (pediatric): Secondary | ICD-10-CM | POA: Diagnosis not present

## 2016-10-23 DIAGNOSIS — C661 Malignant neoplasm of right ureter: Secondary | ICD-10-CM | POA: Diagnosis not present

## 2016-10-23 DIAGNOSIS — Z85118 Personal history of other malignant neoplasm of bronchus and lung: Secondary | ICD-10-CM | POA: Diagnosis not present

## 2016-10-23 DIAGNOSIS — J449 Chronic obstructive pulmonary disease, unspecified: Secondary | ICD-10-CM | POA: Diagnosis not present

## 2016-10-23 DIAGNOSIS — N281 Cyst of kidney, acquired: Secondary | ICD-10-CM | POA: Diagnosis not present

## 2016-10-23 NOTE — Patient Outreach (Signed)
Assessment:  CSW spoke via phone with [name], spouse of client. CSW verified identity of Kyle [name]. CSW and [name] spoke of client needs. Client sees Dr. Grace Bushy. Luking as primary cre doctor. Client had a medical appointment with Dr. Wolfgang Phoenix on 10/20/16.  Client has prescribed medications and is taking medications as prescribed. Client receives support from his spouse, Abid Bolla. [name] does transport client to and from client's scheduled medical appointmetns.  Client does have a history of falls. Client does use a cane or a walker as needed to assist client in ambulation.  Client has completed all in home physical therapy sessions scheduled for client. Client has also completed all scheduled Outpatient  Physical Therapy sessions for client. CSW and [name] spoke of client care plan. CSW encouraged that client continue to communicate with CSW in the next 30 days to discuss community resources of assistance for client. Client receives [place] support with RN [name]. Client is attending all scheduled client medical appointments. Client is currently receiving care at [place] said that client is scheduled to have a medical procedure today at [facility]. She is hoping he will discharge from [facility] soon when he is medically stable. CSW and [name] spoke of nursing support client had received with RN [name].  CSW encouraged client or Kyle [name] to call RN [name] or CSW Theadore Nan as needed to discuss needs of client. [name] was appreciative of call from [place] on 10/23/16.    Plan:  Client to communicate with CSW in the next 30 days to discuss community resources of assistance for client.   CSW to collaborate with RN [name] as needed in monitoring needs of client.  CSW to call client or Kyle [name] in 4 weeks to assess client needs.  [name].Birdie Beveridge MSW, LCSW Licensed Clinical Social Worker [facility] Care  Management [phone]

## 2016-10-24 ENCOUNTER — Telehealth: Payer: Self-pay | Admitting: Family Medicine

## 2016-10-24 DIAGNOSIS — Z79899 Other long term (current) drug therapy: Secondary | ICD-10-CM | POA: Diagnosis not present

## 2016-10-24 DIAGNOSIS — N135 Crossing vessel and stricture of ureter without hydronephrosis: Secondary | ICD-10-CM | POA: Diagnosis not present

## 2016-10-24 DIAGNOSIS — Z8551 Personal history of malignant neoplasm of bladder: Secondary | ICD-10-CM | POA: Diagnosis not present

## 2016-10-24 DIAGNOSIS — Z794 Long term (current) use of insulin: Secondary | ICD-10-CM | POA: Diagnosis not present

## 2016-10-24 DIAGNOSIS — J449 Chronic obstructive pulmonary disease, unspecified: Secondary | ICD-10-CM | POA: Diagnosis not present

## 2016-10-24 DIAGNOSIS — N184 Chronic kidney disease, stage 4 (severe): Secondary | ICD-10-CM | POA: Diagnosis not present

## 2016-10-24 DIAGNOSIS — C661 Malignant neoplasm of right ureter: Secondary | ICD-10-CM | POA: Diagnosis not present

## 2016-10-24 DIAGNOSIS — Z9989 Dependence on other enabling machines and devices: Secondary | ICD-10-CM | POA: Diagnosis not present

## 2016-10-24 DIAGNOSIS — I129 Hypertensive chronic kidney disease with stage 1 through stage 4 chronic kidney disease, or unspecified chronic kidney disease: Secondary | ICD-10-CM | POA: Diagnosis not present

## 2016-10-24 DIAGNOSIS — Z85118 Personal history of other malignant neoplasm of bronchus and lung: Secondary | ICD-10-CM | POA: Diagnosis not present

## 2016-10-24 DIAGNOSIS — G4733 Obstructive sleep apnea (adult) (pediatric): Secondary | ICD-10-CM | POA: Diagnosis not present

## 2016-10-24 DIAGNOSIS — N289 Disorder of kidney and ureter, unspecified: Secondary | ICD-10-CM | POA: Diagnosis not present

## 2016-10-24 DIAGNOSIS — E1122 Type 2 diabetes mellitus with diabetic chronic kidney disease: Secondary | ICD-10-CM | POA: Diagnosis not present

## 2016-10-24 DIAGNOSIS — N281 Cyst of kidney, acquired: Secondary | ICD-10-CM | POA: Diagnosis not present

## 2016-10-24 NOTE — Telephone Encounter (Signed)
Pt son called to move appointment up to Monday instead of Wednesday. States he is worried about his father's O2 level. He was advised that anything under 90 and he should be placed on O2. Also if the patient got worse or O2 level doesn't improve to go to ER. Son voiced understanding. Also advised that if the patient's finger is cold it may give an inaccurate reading.

## 2016-10-27 ENCOUNTER — Telehealth (HOSPITAL_COMMUNITY): Payer: Self-pay | Admitting: Emergency Medicine

## 2016-10-27 ENCOUNTER — Encounter: Payer: Self-pay | Admitting: Family Medicine

## 2016-10-27 ENCOUNTER — Ambulatory Visit (INDEPENDENT_AMBULATORY_CARE_PROVIDER_SITE_OTHER): Payer: Medicare Other | Admitting: Family Medicine

## 2016-10-27 VITALS — BP 132/70 | HR 64 | Temp 98.6°F | Ht 72.0 in | Wt 264.0 lb

## 2016-10-27 DIAGNOSIS — R310 Gross hematuria: Secondary | ICD-10-CM

## 2016-10-27 DIAGNOSIS — J441 Chronic obstructive pulmonary disease with (acute) exacerbation: Secondary | ICD-10-CM | POA: Diagnosis not present

## 2016-10-27 NOTE — Progress Notes (Signed)
   Subjective:  Patient presents for follow-up from hospitalization. We are seeing him within 2 business days of his discharge. This visit was set up by [facility].  Patient ID: Kyle [name], male    DOB: Feb 26, 1934, 83 y.o.   MRN: [id number]  HPIFollow up hematuria. Went to [facility] on April 3 rd. Found a mass that was cancerous. Has a follow up on April 20th.   In presence of patient I went into the records through chart everywhere and looked up the patient's entire course in the hospital. He was admitted with substantial hematuria. Cystoscopy revealed a significant mass. They were unable to pass through this up into the ureter.  Patient had a nephrostomy tube placed on the right side. Dressing applied.  Patient reports some slight discomfort at the site of tube insertion but minimal at this time.  No fever or chills. No significant cough. Patient reports his breathing is pretty much returned to normal.  Family is present in asked me to look up the past solid she report which I did  Had some trouble with 02 dropping in the hospital. O2 today 95.     Review of Systems Diminished energy some weight loss no chest pain no abdominal pain appetite returning    Objective:   Physical Exam Alert and oriented, vitals reviewed and stable, somewhat weak appearing. No acute respiratory distress. Obesity present. ENT-TM's and ext canals WNL bilat via otoscopic exam Soft palate, tonsils and post pharynx WNL via oropharyngeal exam Neck-symmetric, no masses; thyroid nonpalpable and nontender Pulmonary-no tachypnea or accessory muscle use; Clear without wheezes via auscultation Card--no abnrml murmurs, rhythm reg and rate WNL Carotid pulses symmetric, without bruits  Right flank nephrostomy tube present. Surgical site looks good. Dressing reapplied.      Assessment & Plan:  Impression status post hospitalization. 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 Exact location somewhat unclear from surgical note. Discussed with family. Due to see urologist soon. Then we'll see oncologist. #3 transient exacerbation of COPD. Appears resolved. Plan wound care discussed. Dressing reapplied. Patient discusses if he could see oncologist of his choice in the answer certainly is yes. Follow-up at regular appointment WSL

## 2016-10-27 NOTE — Telephone Encounter (Signed)
Pts son called and pt has been seen at [facility] and his bladder cancer is back.  Pt is going to see Dr Rosana Hoes on April 20th and wanted to know if there was any reason to come Thursday for that appt with Tom.  I spoke with Kirby Crigler PA and moved the pts follow up appt out until after he has seen Dr Rosana Hoes.  Pts son notified of new pts appt.

## 2016-10-29 ENCOUNTER — Ambulatory Visit: Payer: Medicare Other | Admitting: Family Medicine

## 2016-10-29 ENCOUNTER — Telehealth: Payer: Self-pay | Admitting: Family Medicine

## 2016-10-29 DIAGNOSIS — N2889 Other specified disorders of kidney and ureter: Secondary | ICD-10-CM | POA: Diagnosis not present

## 2016-10-29 DIAGNOSIS — R262 Difficulty in walking, not elsewhere classified: Secondary | ICD-10-CM | POA: Diagnosis not present

## 2016-10-29 NOTE — Telephone Encounter (Signed)
Requesting verbal order for Home health PT 2 times a week for 4 weeks for balance and gait training.  Also, would like home health aid for 2 times a week for 3 weeks starting next week to help his wife.

## 2016-10-29 NOTE — Telephone Encounter (Signed)
Ok lets 

## 2016-10-29 NOTE — Telephone Encounter (Signed)
Verbal orders given to Amy at [facility].

## 2016-10-30 ENCOUNTER — Encounter: Payer: Self-pay | Admitting: Family Medicine

## 2016-10-30 ENCOUNTER — Ambulatory Visit (HOSPITAL_COMMUNITY): Payer: Medicare Other | Admitting: Oncology

## 2016-10-31 DIAGNOSIS — R262 Difficulty in walking, not elsewhere classified: Secondary | ICD-10-CM | POA: Diagnosis not present

## 2016-10-31 DIAGNOSIS — N2889 Other specified disorders of kidney and ureter: Secondary | ICD-10-CM | POA: Diagnosis not present

## 2016-11-03 ENCOUNTER — Ambulatory Visit: Payer: Medicare Other | Admitting: Family Medicine

## 2016-11-03 DIAGNOSIS — R262 Difficulty in walking, not elsewhere classified: Secondary | ICD-10-CM | POA: Diagnosis not present

## 2016-11-03 DIAGNOSIS — N2889 Other specified disorders of kidney and ureter: Secondary | ICD-10-CM | POA: Diagnosis not present

## 2016-11-05 DIAGNOSIS — N2889 Other specified disorders of kidney and ureter: Secondary | ICD-10-CM | POA: Diagnosis not present

## 2016-11-05 DIAGNOSIS — R262 Difficulty in walking, not elsewhere classified: Secondary | ICD-10-CM | POA: Diagnosis not present

## 2016-11-06 DIAGNOSIS — R262 Difficulty in walking, not elsewhere classified: Secondary | ICD-10-CM | POA: Diagnosis not present

## 2016-11-06 DIAGNOSIS — N2889 Other specified disorders of kidney and ureter: Secondary | ICD-10-CM | POA: Diagnosis not present

## 2016-11-07 DIAGNOSIS — J449 Chronic obstructive pulmonary disease, unspecified: Secondary | ICD-10-CM | POA: Diagnosis not present

## 2016-11-07 DIAGNOSIS — E119 Type 2 diabetes mellitus without complications: Secondary | ICD-10-CM | POA: Diagnosis not present

## 2016-11-07 DIAGNOSIS — D696 Thrombocytopenia, unspecified: Secondary | ICD-10-CM | POA: Diagnosis not present

## 2016-11-07 DIAGNOSIS — C661 Malignant neoplasm of right ureter: Secondary | ICD-10-CM | POA: Diagnosis not present

## 2016-11-07 DIAGNOSIS — C672 Malignant neoplasm of lateral wall of bladder: Secondary | ICD-10-CM | POA: Diagnosis not present

## 2016-11-09 DIAGNOSIS — G4733 Obstructive sleep apnea (adult) (pediatric): Secondary | ICD-10-CM | POA: Diagnosis not present

## 2016-11-10 DIAGNOSIS — R262 Difficulty in walking, not elsewhere classified: Secondary | ICD-10-CM | POA: Diagnosis not present

## 2016-11-10 DIAGNOSIS — N2889 Other specified disorders of kidney and ureter: Secondary | ICD-10-CM | POA: Diagnosis not present

## 2016-11-12 DIAGNOSIS — I252 Old myocardial infarction: Secondary | ICD-10-CM | POA: Diagnosis not present

## 2016-11-12 DIAGNOSIS — Z936 Other artificial openings of urinary tract status: Secondary | ICD-10-CM | POA: Diagnosis not present

## 2016-11-12 DIAGNOSIS — N2581 Secondary hyperparathyroidism of renal origin: Secondary | ICD-10-CM | POA: Diagnosis not present

## 2016-11-12 DIAGNOSIS — Z886 Allergy status to analgesic agent status: Secondary | ICD-10-CM | POA: Diagnosis not present

## 2016-11-12 DIAGNOSIS — C661 Malignant neoplasm of right ureter: Secondary | ICD-10-CM | POA: Diagnosis not present

## 2016-11-12 DIAGNOSIS — J449 Chronic obstructive pulmonary disease, unspecified: Secondary | ICD-10-CM | POA: Diagnosis not present

## 2016-11-12 DIAGNOSIS — I2510 Atherosclerotic heart disease of native coronary artery without angina pectoris: Secondary | ICD-10-CM | POA: Diagnosis not present

## 2016-11-12 DIAGNOSIS — R262 Difficulty in walking, not elsewhere classified: Secondary | ICD-10-CM | POA: Diagnosis not present

## 2016-11-12 DIAGNOSIS — C689 Malignant neoplasm of urinary organ, unspecified: Secondary | ICD-10-CM | POA: Diagnosis not present

## 2016-11-12 DIAGNOSIS — C679 Malignant neoplasm of bladder, unspecified: Secondary | ICD-10-CM | POA: Diagnosis not present

## 2016-11-12 DIAGNOSIS — Z794 Long term (current) use of insulin: Secondary | ICD-10-CM | POA: Diagnosis not present

## 2016-11-12 DIAGNOSIS — Z7982 Long term (current) use of aspirin: Secondary | ICD-10-CM | POA: Diagnosis not present

## 2016-11-12 DIAGNOSIS — Z85118 Personal history of other malignant neoplasm of bronchus and lung: Secondary | ICD-10-CM | POA: Diagnosis not present

## 2016-11-12 DIAGNOSIS — Z79899 Other long term (current) drug therapy: Secondary | ICD-10-CM | POA: Diagnosis not present

## 2016-11-12 DIAGNOSIS — Z885 Allergy status to narcotic agent status: Secondary | ICD-10-CM | POA: Diagnosis not present

## 2016-11-12 DIAGNOSIS — E1122 Type 2 diabetes mellitus with diabetic chronic kidney disease: Secondary | ICD-10-CM | POA: Diagnosis not present

## 2016-11-12 DIAGNOSIS — N184 Chronic kidney disease, stage 4 (severe): Secondary | ICD-10-CM | POA: Diagnosis not present

## 2016-11-12 DIAGNOSIS — N2889 Other specified disorders of kidney and ureter: Secondary | ICD-10-CM | POA: Diagnosis not present

## 2016-11-12 DIAGNOSIS — N281 Cyst of kidney, acquired: Secondary | ICD-10-CM | POA: Diagnosis not present

## 2016-11-12 DIAGNOSIS — Z9889 Other specified postprocedural states: Secondary | ICD-10-CM | POA: Diagnosis not present

## 2016-11-12 DIAGNOSIS — I129 Hypertensive chronic kidney disease with stage 1 through stage 4 chronic kidney disease, or unspecified chronic kidney disease: Secondary | ICD-10-CM | POA: Diagnosis not present

## 2016-11-12 DIAGNOSIS — D696 Thrombocytopenia, unspecified: Secondary | ICD-10-CM | POA: Diagnosis not present

## 2016-11-13 ENCOUNTER — Ambulatory Visit (HOSPITAL_COMMUNITY): Payer: Medicare Other | Admitting: Oncology

## 2016-11-13 ENCOUNTER — Ambulatory Visit (HOSPITAL_COMMUNITY): Payer: Medicare Other

## 2016-11-14 DIAGNOSIS — R262 Difficulty in walking, not elsewhere classified: Secondary | ICD-10-CM | POA: Diagnosis not present

## 2016-11-14 DIAGNOSIS — N2889 Other specified disorders of kidney and ureter: Secondary | ICD-10-CM | POA: Diagnosis not present

## 2016-11-17 DIAGNOSIS — N2889 Other specified disorders of kidney and ureter: Secondary | ICD-10-CM | POA: Diagnosis not present

## 2016-11-17 DIAGNOSIS — R262 Difficulty in walking, not elsewhere classified: Secondary | ICD-10-CM | POA: Diagnosis not present

## 2016-11-17 DIAGNOSIS — J449 Chronic obstructive pulmonary disease, unspecified: Secondary | ICD-10-CM | POA: Diagnosis not present

## 2016-11-18 ENCOUNTER — Telehealth: Payer: Self-pay | Admitting: Family Medicine

## 2016-11-18 ENCOUNTER — Other Ambulatory Visit: Payer: Self-pay | Admitting: *Deleted

## 2016-11-18 DIAGNOSIS — C672 Malignant neoplasm of lateral wall of bladder: Secondary | ICD-10-CM | POA: Diagnosis not present

## 2016-11-18 DIAGNOSIS — G252 Other specified forms of tremor: Secondary | ICD-10-CM | POA: Diagnosis not present

## 2016-11-18 DIAGNOSIS — J9691 Respiratory failure, unspecified with hypoxia: Secondary | ICD-10-CM | POA: Diagnosis not present

## 2016-11-18 DIAGNOSIS — N184 Chronic kidney disease, stage 4 (severe): Secondary | ICD-10-CM

## 2016-11-18 DIAGNOSIS — C670 Malignant neoplasm of trigone of bladder: Secondary | ICD-10-CM | POA: Diagnosis not present

## 2016-11-18 DIAGNOSIS — M1990 Unspecified osteoarthritis, unspecified site: Secondary | ICD-10-CM | POA: Diagnosis not present

## 2016-11-18 DIAGNOSIS — Z936 Other artificial openings of urinary tract status: Secondary | ICD-10-CM | POA: Diagnosis not present

## 2016-11-18 DIAGNOSIS — N3020 Other chronic cystitis without hematuria: Secondary | ICD-10-CM | POA: Diagnosis not present

## 2016-11-18 DIAGNOSIS — Z9181 History of falling: Secondary | ICD-10-CM | POA: Diagnosis not present

## 2016-11-18 NOTE — Telephone Encounter (Signed)
Lets try

## 2016-11-18 NOTE — Telephone Encounter (Signed)
Spoke with patient's wife and informed her that we put in an order for Home Health. [name] verbalized understanding.

## 2016-11-18 NOTE — Telephone Encounter (Signed)
Patient and wife are having trouble managing his nephrostomy and would like home health nurse ordered to help out.

## 2016-11-18 NOTE — Patient Outreach (Signed)
[facility]) Care Management  11/18/2016  Kyle [name] 12/08/1933 [id number]  Kyle [name] avery pleasant 83year old patient who has been followed off and onby [place] [facility] for greater than 3years. He has multiple chronic medical problems including HTN, DMII, CAD, COPD, and CKD. Kyle [name] fell earlier in Novem[place]ustaining a subdural hematoma and required a 3 day hospital stay. He was transitioned to the nursing facility for rehab and recovery prior to discharging to home.   Kyle [name] lives at home with his wife Kyle [name] who is his primary caregiver. He has been followed in the community off and on for more than 4 years by [place] [facility] for assistance with chronic disease management and care coordination.   Kyle [name] presented to the ED in March with hematuria and was followed up at [facility] by his urologist, Kyle Bach MD. He underwent cystoscopy which revealed a cancerous mass. A nephrostomy tube placed on the right side. He has had home health care with HHPT, HHA, and HHRN through [facility]. His PT and CNA care is to be completed at the end of this week. He and his wife are concerned about continued care and management of the nephrostomy bag and dressing. The [facility] is scheduled to come back to the home on Thursday to review and assess whether continued visits are needed.   Kyle [name] is scheduled to begin immunotherapy tomorrow at [facility]. He and his wife said all their questions had been answered and they felt they understood what to expect tomorrow.   Plan: I will follow up with Kyle [name] on Thursday afternoon, after the [facility] has visited to follow up on plans for ongoing care of nephrostomy.    [place] [facility]  [phone]

## 2016-11-19 DIAGNOSIS — N390 Urinary tract infection, site not specified: Secondary | ICD-10-CM | POA: Diagnosis not present

## 2016-11-19 DIAGNOSIS — N2581 Secondary hyperparathyroidism of renal origin: Secondary | ICD-10-CM | POA: Diagnosis not present

## 2016-11-19 DIAGNOSIS — J8410 Pulmonary fibrosis, unspecified: Secondary | ICD-10-CM | POA: Diagnosis not present

## 2016-11-19 DIAGNOSIS — C689 Malignant neoplasm of urinary organ, unspecified: Secondary | ICD-10-CM | POA: Diagnosis not present

## 2016-11-19 DIAGNOSIS — I129 Hypertensive chronic kidney disease with stage 1 through stage 4 chronic kidney disease, or unspecified chronic kidney disease: Secondary | ICD-10-CM | POA: Diagnosis not present

## 2016-11-19 DIAGNOSIS — N200 Calculus of kidney: Secondary | ICD-10-CM | POA: Diagnosis not present

## 2016-11-19 DIAGNOSIS — C672 Malignant neoplasm of lateral wall of bladder: Secondary | ICD-10-CM | POA: Diagnosis not present

## 2016-11-19 DIAGNOSIS — J439 Emphysema, unspecified: Secondary | ICD-10-CM | POA: Diagnosis not present

## 2016-11-19 DIAGNOSIS — J479 Bronchiectasis, uncomplicated: Secondary | ICD-10-CM | POA: Diagnosis not present

## 2016-11-19 DIAGNOSIS — D631 Anemia in chronic kidney disease: Secondary | ICD-10-CM | POA: Diagnosis not present

## 2016-11-19 DIAGNOSIS — M7989 Other specified soft tissue disorders: Secondary | ICD-10-CM | POA: Diagnosis not present

## 2016-11-19 DIAGNOSIS — N184 Chronic kidney disease, stage 4 (severe): Secondary | ICD-10-CM | POA: Diagnosis not present

## 2016-11-19 DIAGNOSIS — R319 Hematuria, unspecified: Secondary | ICD-10-CM | POA: Diagnosis not present

## 2016-11-19 DIAGNOSIS — Z5112 Encounter for antineoplastic immunotherapy: Secondary | ICD-10-CM | POA: Diagnosis not present

## 2016-11-19 DIAGNOSIS — R918 Other nonspecific abnormal finding of lung field: Secondary | ICD-10-CM | POA: Diagnosis not present

## 2016-11-20 ENCOUNTER — Other Ambulatory Visit: Payer: Self-pay | Admitting: *Deleted

## 2016-11-20 DIAGNOSIS — C670 Malignant neoplasm of trigone of bladder: Secondary | ICD-10-CM | POA: Diagnosis not present

## 2016-11-20 DIAGNOSIS — M1990 Unspecified osteoarthritis, unspecified site: Secondary | ICD-10-CM | POA: Diagnosis not present

## 2016-11-20 DIAGNOSIS — Z9181 History of falling: Secondary | ICD-10-CM | POA: Diagnosis not present

## 2016-11-20 DIAGNOSIS — G252 Other specified forms of tremor: Secondary | ICD-10-CM | POA: Diagnosis not present

## 2016-11-20 DIAGNOSIS — Z936 Other artificial openings of urinary tract status: Secondary | ICD-10-CM | POA: Diagnosis not present

## 2016-11-20 DIAGNOSIS — J9691 Respiratory failure, unspecified with hypoxia: Secondary | ICD-10-CM | POA: Diagnosis not present

## 2016-11-20 DIAGNOSIS — C672 Malignant neoplasm of lateral wall of bladder: Secondary | ICD-10-CM | POA: Diagnosis not present

## 2016-11-20 DIAGNOSIS — N3020 Other chronic cystitis without hematuria: Secondary | ICD-10-CM | POA: Diagnosis not present

## 2016-11-20 DIAGNOSIS — N184 Chronic kidney disease, stage 4 (severe): Secondary | ICD-10-CM | POA: Diagnosis not present

## 2016-11-20 NOTE — Patient Outreach (Signed)
[name]) Care Management  11/20/2016  Kyle [name] 05-May-1934 [id number]  [name] presented to the ED in March with hematuria and was followed up at [facility] by his urologist, Lawerance Bach MD. He underwent cystoscopy which revealed a cancerous mass. A nephrostomy tube placed on the right side. He has had home health care with HHPT, HHA, and HHRN through [facility]. His PT and CNA care is to be completed at the end of this week. He and his wife are concerned about continued care and management of the nephrostomy bag and dressing. The [facility] is scheduled to come back to the home on Thursday to review and assess whether continued visits are needed.   [name] began immunotherapy yesterday at [facility]. I called today to follow up on how his treatment went and on needs surrounding care of his nephrostomy tube/site. [name] says he feels his first immunotherapy treatment went well and so far he hasn't experienced any side effects. His HHRN visited today and will be returning for ongoing care of nephrostomy tube.   Plan: I will follow up with [name] by phone again next week.   [place] [facility]  [phone]

## 2016-11-21 ENCOUNTER — Other Ambulatory Visit: Payer: Self-pay | Admitting: Licensed Clinical Social Worker

## 2016-11-21 DIAGNOSIS — Z936 Other artificial openings of urinary tract status: Secondary | ICD-10-CM | POA: Diagnosis not present

## 2016-11-21 DIAGNOSIS — N3020 Other chronic cystitis without hematuria: Secondary | ICD-10-CM | POA: Diagnosis not present

## 2016-11-21 DIAGNOSIS — N184 Chronic kidney disease, stage 4 (severe): Secondary | ICD-10-CM | POA: Diagnosis not present

## 2016-11-21 DIAGNOSIS — C670 Malignant neoplasm of trigone of bladder: Secondary | ICD-10-CM | POA: Diagnosis not present

## 2016-11-21 DIAGNOSIS — Z9181 History of falling: Secondary | ICD-10-CM | POA: Diagnosis not present

## 2016-11-21 DIAGNOSIS — M1990 Unspecified osteoarthritis, unspecified site: Secondary | ICD-10-CM | POA: Diagnosis not present

## 2016-11-21 DIAGNOSIS — C672 Malignant neoplasm of lateral wall of bladder: Secondary | ICD-10-CM | POA: Diagnosis not present

## 2016-11-21 DIAGNOSIS — J9691 Respiratory failure, unspecified with hypoxia: Secondary | ICD-10-CM | POA: Diagnosis not present

## 2016-11-21 DIAGNOSIS — G252 Other specified forms of tremor: Secondary | ICD-10-CM | POA: Diagnosis not present

## 2016-11-21 NOTE — Patient Outreach (Signed)
Assessment:  Client sees Dr. Grace Bushy. Luk[place] as primary care doctor. Client has prescribed medications and is taking medications as prescribed. CSW spoke via phone with [name], spouse of client, on 11/21/16. CSW verified identity of Deyvi Bonanno. [name] and CSW spoke of client needs. Client had been receiving in home physical therapy sessions and home health aide support as scheduled with home health agency. [name] said that these two services with home health will be completed for client this week. [name] said that [place] [facility] will continue to provide in home nursing support for client as scheduled. Client has a nephrostomy tube at present and RN will assist client in caring for nephrostomy tube. [name], spouse of client, transports client to and from client's scheduled medical appointments.  Client is also receiving [facility] nursing support with RN [name].  CSW and [name] spoke of client care plan. CSW encouraged that client communicate with CSW in next 30 days to discuss community resources of assistance for client. Client is managing with nephrostomy tube placement. Client has not reported any current pain issues.  CSW encouraged that [name] or client call RN [name] or CSW Theadore Nan as needed to address current needs of client.  Client and Ulis Kaps have current CSW phone number of [phone] to use as needed to discuss social work needs of client.   Plan:  Client to communicate with CSW in next 30 days to discuss community resources of assistance for client.  CSW to collaborate with RN [name] as needed in monitoring needs of client.  CSW to call client or Patrick Salemi in 4 weeks to assess client needs.  [name].Zafira Munos MSW, LCSW Licensed Clinical Social Worker [facility] Care Management [phone]

## 2016-11-24 ENCOUNTER — Other Ambulatory Visit: Payer: Self-pay | Admitting: Family Medicine

## 2016-11-25 DIAGNOSIS — M1990 Unspecified osteoarthritis, unspecified site: Secondary | ICD-10-CM | POA: Diagnosis not present

## 2016-11-25 DIAGNOSIS — G252 Other specified forms of tremor: Secondary | ICD-10-CM | POA: Diagnosis not present

## 2016-11-25 DIAGNOSIS — C670 Malignant neoplasm of trigone of bladder: Secondary | ICD-10-CM | POA: Diagnosis not present

## 2016-11-25 DIAGNOSIS — C672 Malignant neoplasm of lateral wall of bladder: Secondary | ICD-10-CM | POA: Diagnosis not present

## 2016-11-25 DIAGNOSIS — J9691 Respiratory failure, unspecified with hypoxia: Secondary | ICD-10-CM | POA: Diagnosis not present

## 2016-11-25 DIAGNOSIS — Z9181 History of falling: Secondary | ICD-10-CM | POA: Diagnosis not present

## 2016-11-25 DIAGNOSIS — Z936 Other artificial openings of urinary tract status: Secondary | ICD-10-CM | POA: Diagnosis not present

## 2016-11-25 DIAGNOSIS — N184 Chronic kidney disease, stage 4 (severe): Secondary | ICD-10-CM | POA: Diagnosis not present

## 2016-11-25 DIAGNOSIS — N3020 Other chronic cystitis without hematuria: Secondary | ICD-10-CM | POA: Diagnosis not present

## 2016-11-26 ENCOUNTER — Telehealth: Payer: Self-pay | Admitting: Family Medicine

## 2016-11-26 NOTE — Telephone Encounter (Signed)
Pt dropped off a letter stating that he needs a new prescription for home oxygen phoned into [place] due to his insurance not covering [place] any longer. Letter is in nurse box.

## 2016-11-27 ENCOUNTER — Other Ambulatory Visit (HOSPITAL_COMMUNITY)
Admission: RE | Admit: 2016-11-27 | Discharge: 2016-11-27 | Disposition: A | Discharge status: Home / Self Care | Payer: Medicare Other | Source: Ambulatory Visit | Attending: Internal Medicine | Admitting: Internal Medicine

## 2016-11-27 DIAGNOSIS — N3020 Other chronic cystitis without hematuria: Secondary | ICD-10-CM | POA: Diagnosis not present

## 2016-11-27 DIAGNOSIS — C670 Malignant neoplasm of trigone of bladder: Secondary | ICD-10-CM | POA: Diagnosis not present

## 2016-11-27 DIAGNOSIS — C672 Malignant neoplasm of lateral wall of bladder: Secondary | ICD-10-CM | POA: Diagnosis not present

## 2016-11-27 DIAGNOSIS — Z9181 History of falling: Secondary | ICD-10-CM | POA: Diagnosis not present

## 2016-11-27 DIAGNOSIS — N189 Chronic kidney disease, unspecified: Secondary | ICD-10-CM | POA: Insufficient documentation

## 2016-11-27 DIAGNOSIS — M1990 Unspecified osteoarthritis, unspecified site: Secondary | ICD-10-CM | POA: Diagnosis not present

## 2016-11-27 DIAGNOSIS — Z936 Other artificial openings of urinary tract status: Secondary | ICD-10-CM | POA: Diagnosis not present

## 2016-11-27 DIAGNOSIS — G252 Other specified forms of tremor: Secondary | ICD-10-CM | POA: Diagnosis not present

## 2016-11-27 DIAGNOSIS — N184 Chronic kidney disease, stage 4 (severe): Secondary | ICD-10-CM | POA: Diagnosis not present

## 2016-11-27 DIAGNOSIS — J9691 Respiratory failure, unspecified with hypoxia: Secondary | ICD-10-CM | POA: Diagnosis not present

## 2016-11-27 LAB — BASIC METABOLIC PANEL
ANION GAP: 7 (ref 5–15)
BUN: 29 mg/dL — ABNORMAL HIGH (ref 6–20)
CALCIUM: 9.1 mg/dL (ref 8.9–10.3)
CHLORIDE: 107 mmol/L (ref 101–111)
CO2: 27 mmol/L (ref 22–32)
Creatinine, Ser: 2.6 mg/dL — ABNORMAL HIGH (ref 0.61–1.24)
GFR calc non Af Amer: 22 mL/min — ABNORMAL LOW (ref >60)
GFR, EST AFRICAN AMERICAN: 25 mL/min — AB (ref >60)
Glucose, Bld: 201 mg/dL — ABNORMAL HIGH (ref 65–99)
Potassium: 4.1 mmol/L (ref 3.5–5.1)
Sodium: 141 mmol/L (ref 135–145)

## 2016-11-27 LAB — URINALYSIS, ROUTINE W REFLEX MICROSCOPIC
BACTERIA UA: NONE SEEN
Bilirubin Urine: NEGATIVE
Glucose, UA: 150 mg/dL — AB
Ketones, ur: NEGATIVE mg/dL
LEUKOCYTES UA: NEGATIVE
NITRITE: NEGATIVE
Protein, ur: 100 mg/dL — AB
SPECIFIC GRAVITY, URINE: 1.016 (ref 1.005–1.030)
Squamous Epithelial / LPF: NONE SEEN
pH: 5 (ref 5.0–8.0)

## 2016-11-28 DIAGNOSIS — E1122 Type 2 diabetes mellitus with diabetic chronic kidney disease: Secondary | ICD-10-CM | POA: Diagnosis not present

## 2016-11-28 DIAGNOSIS — D693 Immune thrombocytopenic purpura: Secondary | ICD-10-CM | POA: Diagnosis not present

## 2016-11-28 DIAGNOSIS — Z9181 History of falling: Secondary | ICD-10-CM | POA: Diagnosis not present

## 2016-11-28 DIAGNOSIS — M1990 Unspecified osteoarthritis, unspecified site: Secondary | ICD-10-CM | POA: Diagnosis not present

## 2016-11-28 DIAGNOSIS — N184 Chronic kidney disease, stage 4 (severe): Secondary | ICD-10-CM | POA: Diagnosis not present

## 2016-11-28 DIAGNOSIS — J9691 Respiratory failure, unspecified with hypoxia: Secondary | ICD-10-CM | POA: Diagnosis not present

## 2016-11-28 DIAGNOSIS — C672 Malignant neoplasm of lateral wall of bladder: Secondary | ICD-10-CM | POA: Diagnosis not present

## 2016-11-28 DIAGNOSIS — G252 Other specified forms of tremor: Secondary | ICD-10-CM | POA: Diagnosis not present

## 2016-11-28 DIAGNOSIS — N3020 Other chronic cystitis without hematuria: Secondary | ICD-10-CM | POA: Diagnosis not present

## 2016-11-28 DIAGNOSIS — C661 Malignant neoplasm of right ureter: Secondary | ICD-10-CM | POA: Diagnosis not present

## 2016-11-28 DIAGNOSIS — C670 Malignant neoplasm of trigone of bladder: Secondary | ICD-10-CM | POA: Diagnosis not present

## 2016-11-28 DIAGNOSIS — Z936 Other artificial openings of urinary tract status: Secondary | ICD-10-CM | POA: Diagnosis not present

## 2016-12-03 ENCOUNTER — Other Ambulatory Visit: Payer: Self-pay | Admitting: *Deleted

## 2016-12-03 DIAGNOSIS — C670 Malignant neoplasm of trigone of bladder: Secondary | ICD-10-CM | POA: Diagnosis not present

## 2016-12-03 DIAGNOSIS — M1990 Unspecified osteoarthritis, unspecified site: Secondary | ICD-10-CM | POA: Diagnosis not present

## 2016-12-03 DIAGNOSIS — Z936 Other artificial openings of urinary tract status: Secondary | ICD-10-CM | POA: Diagnosis not present

## 2016-12-03 DIAGNOSIS — N184 Chronic kidney disease, stage 4 (severe): Secondary | ICD-10-CM | POA: Diagnosis not present

## 2016-12-03 DIAGNOSIS — C672 Malignant neoplasm of lateral wall of bladder: Secondary | ICD-10-CM | POA: Diagnosis not present

## 2016-12-03 DIAGNOSIS — G252 Other specified forms of tremor: Secondary | ICD-10-CM | POA: Diagnosis not present

## 2016-12-03 DIAGNOSIS — Z9181 History of falling: Secondary | ICD-10-CM | POA: Diagnosis not present

## 2016-12-03 DIAGNOSIS — J9691 Respiratory failure, unspecified with hypoxia: Secondary | ICD-10-CM | POA: Diagnosis not present

## 2016-12-03 DIAGNOSIS — N3020 Other chronic cystitis without hematuria: Secondary | ICD-10-CM | POA: Diagnosis not present

## 2016-12-03 NOTE — Patient Outreach (Signed)
[facility]) Care Management  12/03/2016  Kyle [name] [birth year] [id number]  Telephone outreach today to follow up with Kyle [name] about his bladder cancer treatment. He has a nephrostomy tube on the right side. He has completed a course of HHPT but still has a [place] visiting for assistance with management of his nephrostomy tube.   Kyle [name] began immunotherapy on 11/19/16 at [facility]. His next scheduled appointment/treatment is 12/10/16.   Plan: I will continue telephonic outreach to Kyle [name] as he has ongoing cancer care visits at [facility] and routine visits with his primary care provider, cardiologist, and urologist. He asked specifically if I would follow up with him by phone a few days after his next immunotherapy treatment. He is scheduled for a call on 12/11/16.    [place] Management  [phone]

## 2016-12-04 DIAGNOSIS — J449 Chronic obstructive pulmonary disease, unspecified: Secondary | ICD-10-CM | POA: Diagnosis not present

## 2016-12-09 DIAGNOSIS — G4733 Obstructive sleep apnea (adult) (pediatric): Secondary | ICD-10-CM | POA: Diagnosis not present

## 2016-12-10 DIAGNOSIS — Z466 Encounter for fitting and adjustment of urinary device: Secondary | ICD-10-CM | POA: Diagnosis not present

## 2016-12-10 DIAGNOSIS — C661 Malignant neoplasm of right ureter: Secondary | ICD-10-CM | POA: Diagnosis not present

## 2016-12-10 DIAGNOSIS — N200 Calculus of kidney: Secondary | ICD-10-CM | POA: Diagnosis not present

## 2016-12-10 DIAGNOSIS — I252 Old myocardial infarction: Secondary | ICD-10-CM | POA: Diagnosis not present

## 2016-12-10 DIAGNOSIS — N2581 Secondary hyperparathyroidism of renal origin: Secondary | ICD-10-CM | POA: Diagnosis not present

## 2016-12-10 DIAGNOSIS — N135 Crossing vessel and stricture of ureter without hydronephrosis: Secondary | ICD-10-CM | POA: Diagnosis not present

## 2016-12-10 DIAGNOSIS — I129 Hypertensive chronic kidney disease with stage 1 through stage 4 chronic kidney disease, or unspecified chronic kidney disease: Secondary | ICD-10-CM | POA: Diagnosis not present

## 2016-12-10 DIAGNOSIS — N1330 Unspecified hydronephrosis: Secondary | ICD-10-CM | POA: Diagnosis not present

## 2016-12-10 DIAGNOSIS — I2510 Atherosclerotic heart disease of native coronary artery without angina pectoris: Secondary | ICD-10-CM | POA: Diagnosis not present

## 2016-12-10 DIAGNOSIS — Z7982 Long term (current) use of aspirin: Secondary | ICD-10-CM | POA: Diagnosis not present

## 2016-12-10 DIAGNOSIS — Z794 Long term (current) use of insulin: Secondary | ICD-10-CM | POA: Diagnosis not present

## 2016-12-10 DIAGNOSIS — Z79899 Other long term (current) drug therapy: Secondary | ICD-10-CM | POA: Diagnosis not present

## 2016-12-10 DIAGNOSIS — D696 Thrombocytopenia, unspecified: Secondary | ICD-10-CM | POA: Diagnosis not present

## 2016-12-10 DIAGNOSIS — Z886 Allergy status to analgesic agent status: Secondary | ICD-10-CM | POA: Diagnosis not present

## 2016-12-10 DIAGNOSIS — N184 Chronic kidney disease, stage 4 (severe): Secondary | ICD-10-CM | POA: Diagnosis not present

## 2016-12-10 DIAGNOSIS — Z7951 Long term (current) use of inhaled steroids: Secondary | ICD-10-CM | POA: Diagnosis not present

## 2016-12-10 DIAGNOSIS — C679 Malignant neoplasm of bladder, unspecified: Secondary | ICD-10-CM | POA: Diagnosis not present

## 2016-12-10 DIAGNOSIS — Z885 Allergy status to narcotic agent status: Secondary | ICD-10-CM | POA: Diagnosis not present

## 2016-12-10 DIAGNOSIS — N320 Bladder-neck obstruction: Secondary | ICD-10-CM | POA: Diagnosis not present

## 2016-12-10 DIAGNOSIS — J449 Chronic obstructive pulmonary disease, unspecified: Secondary | ICD-10-CM | POA: Diagnosis not present

## 2016-12-10 DIAGNOSIS — E1122 Type 2 diabetes mellitus with diabetic chronic kidney disease: Secondary | ICD-10-CM | POA: Diagnosis not present

## 2016-12-10 DIAGNOSIS — Z9889 Other specified postprocedural states: Secondary | ICD-10-CM | POA: Diagnosis not present

## 2016-12-10 DIAGNOSIS — Z936 Other artificial openings of urinary tract status: Secondary | ICD-10-CM | POA: Diagnosis not present

## 2016-12-11 ENCOUNTER — Ambulatory Visit: Payer: Self-pay | Admitting: *Deleted

## 2016-12-11 DIAGNOSIS — G252 Other specified forms of tremor: Secondary | ICD-10-CM | POA: Diagnosis not present

## 2016-12-11 DIAGNOSIS — Z936 Other artificial openings of urinary tract status: Secondary | ICD-10-CM | POA: Diagnosis not present

## 2016-12-11 DIAGNOSIS — J9691 Respiratory failure, unspecified with hypoxia: Secondary | ICD-10-CM | POA: Diagnosis not present

## 2016-12-11 DIAGNOSIS — M1990 Unspecified osteoarthritis, unspecified site: Secondary | ICD-10-CM | POA: Diagnosis not present

## 2016-12-11 DIAGNOSIS — N184 Chronic kidney disease, stage 4 (severe): Secondary | ICD-10-CM | POA: Diagnosis not present

## 2016-12-11 DIAGNOSIS — N3020 Other chronic cystitis without hematuria: Secondary | ICD-10-CM | POA: Diagnosis not present

## 2016-12-11 DIAGNOSIS — C670 Malignant neoplasm of trigone of bladder: Secondary | ICD-10-CM | POA: Diagnosis not present

## 2016-12-11 DIAGNOSIS — Z9181 History of falling: Secondary | ICD-10-CM | POA: Diagnosis not present

## 2016-12-11 DIAGNOSIS — C672 Malignant neoplasm of lateral wall of bladder: Secondary | ICD-10-CM | POA: Diagnosis not present

## 2016-12-12 DIAGNOSIS — Z5112 Encounter for antineoplastic immunotherapy: Secondary | ICD-10-CM | POA: Diagnosis not present

## 2016-12-12 DIAGNOSIS — C679 Malignant neoplasm of bladder, unspecified: Secondary | ICD-10-CM | POA: Diagnosis not present

## 2016-12-12 DIAGNOSIS — N320 Bladder-neck obstruction: Secondary | ICD-10-CM | POA: Diagnosis not present

## 2016-12-12 DIAGNOSIS — C661 Malignant neoplasm of right ureter: Secondary | ICD-10-CM | POA: Diagnosis not present

## 2016-12-17 DIAGNOSIS — M1990 Unspecified osteoarthritis, unspecified site: Secondary | ICD-10-CM | POA: Diagnosis not present

## 2016-12-17 DIAGNOSIS — C672 Malignant neoplasm of lateral wall of bladder: Secondary | ICD-10-CM | POA: Diagnosis not present

## 2016-12-17 DIAGNOSIS — Z936 Other artificial openings of urinary tract status: Secondary | ICD-10-CM | POA: Diagnosis not present

## 2016-12-17 DIAGNOSIS — G252 Other specified forms of tremor: Secondary | ICD-10-CM | POA: Diagnosis not present

## 2016-12-17 DIAGNOSIS — N3020 Other chronic cystitis without hematuria: Secondary | ICD-10-CM | POA: Diagnosis not present

## 2016-12-17 DIAGNOSIS — J9691 Respiratory failure, unspecified with hypoxia: Secondary | ICD-10-CM | POA: Diagnosis not present

## 2016-12-17 DIAGNOSIS — C670 Malignant neoplasm of trigone of bladder: Secondary | ICD-10-CM | POA: Diagnosis not present

## 2016-12-17 DIAGNOSIS — N184 Chronic kidney disease, stage 4 (severe): Secondary | ICD-10-CM | POA: Diagnosis not present

## 2016-12-17 DIAGNOSIS — Z9181 History of falling: Secondary | ICD-10-CM | POA: Diagnosis not present

## 2016-12-18 DIAGNOSIS — G4733 Obstructive sleep apnea (adult) (pediatric): Secondary | ICD-10-CM | POA: Diagnosis not present

## 2016-12-22 ENCOUNTER — Other Ambulatory Visit: Payer: Self-pay | Admitting: Licensed Clinical Social Worker

## 2016-12-22 DIAGNOSIS — Z936 Other artificial openings of urinary tract status: Secondary | ICD-10-CM | POA: Diagnosis not present

## 2016-12-22 NOTE — Patient Outreach (Signed)
Assessment:  CSW spoke via phone with Kyle [name]. CSW verified  identity of Kyle [name]. Kyle [name] and CSW spoke of client needs.  Client sees Dr. Grace Bushy. Luk[place] as primary care doctor. Client has prescribed medications and is taking medications as prescribed. Client has now completed home health physical therapy sessions for client. Client is no longer receiving home health aide support at this time. Client continues to receive home health nursing services as scheduled weekly. Client has nephrostomy tube and home health nurse is helping client weekly with management of nephrostomy tube care.  Kyle [name],spouse of client, transports client to and from clients' scheduled medical appointments. Client is receiving [facility] nursing support with RN Kyle [name].  CSW and Kyle [name] spoke of client care plan. CSW encouraged that client communicate with CSW in next 30 days to discuss community resources of assistance for client. Client is attending scheduled client medical appointments. Client has not reported any current pain issues.  Client is sleeping well and has adequate appetite. Kyle [name] reported that client's home health nursing services will likely end this week.  She said home health RN had talked with client and Kyle [name] about supplies needed for client for nephrostomy care for client.  CSW talked with Kyle [name] about [facility] services in nursing, social work and pharmacy.  CSW encouraged client or Kyle [name] to call CSW or [facility] RN Kyle [name] as needed for support.  CSW encouraged client or Kyle [name] call CSW at [phone] as needed to discuss social work needs of client.    Plan:  Client to communicate with CSW in next 30 days to discus community resources of assistance for client.  CSW to collaborate with RN Kyle [name] in monitoring needs of client.  CSW to call client in 4 weeks to assess client needs at that time.  [name].[name] MSW, LCSW Licensed Clinical Social Worker [facility] Care Management [phone]

## 2016-12-23 ENCOUNTER — Other Ambulatory Visit: Payer: Self-pay | Admitting: *Deleted

## 2016-12-23 NOTE — Patient Outreach (Signed)
[facility]) Care Management  12/23/2016  Kyle [name] 1934-01-26 [id number]   Kyle [name] presented to the ED in March with hematuria and was followed up at [facility] by his urologist, Lawerance Bach MD. He underwent cystoscopy whichrevealed a cancerous mass. Kyle [name] began immunotherapy at [facility] on 11/19/16.  A nephrostomy tube was placed on the right side. He has had home health care with HHPT, HHA, and HHRN through [facility] to help with strengthening, conditioning, and care of his nephrostomy tube. Today was the last day of home care visits.   Today, Kyle [name] says he is feeling well and has had excellent follow up and instruction by the home health nurse regarding care of his nephrostomy tube. He is very clear about plans for ongoing immunotherapy. He and his wife say they feel they would benefit from a visit from me next week just to be sure they're doing okay with nephrostomy tube care.   Plan: I will see Kyle [name] at home next week for a routine home visit. He asked if I would call back over the next few days when his wife would be available to look over the calendar.    [place] Management  [phone]

## 2016-12-24 DIAGNOSIS — N184 Chronic kidney disease, stage 4 (severe): Secondary | ICD-10-CM | POA: Diagnosis not present

## 2016-12-24 DIAGNOSIS — G252 Other specified forms of tremor: Secondary | ICD-10-CM | POA: Diagnosis not present

## 2016-12-24 DIAGNOSIS — J9691 Respiratory failure, unspecified with hypoxia: Secondary | ICD-10-CM | POA: Diagnosis not present

## 2016-12-24 DIAGNOSIS — Z9181 History of falling: Secondary | ICD-10-CM | POA: Diagnosis not present

## 2016-12-24 DIAGNOSIS — M1990 Unspecified osteoarthritis, unspecified site: Secondary | ICD-10-CM | POA: Diagnosis not present

## 2016-12-24 DIAGNOSIS — N3020 Other chronic cystitis without hematuria: Secondary | ICD-10-CM | POA: Diagnosis not present

## 2016-12-24 DIAGNOSIS — C672 Malignant neoplasm of lateral wall of bladder: Secondary | ICD-10-CM | POA: Diagnosis not present

## 2016-12-24 DIAGNOSIS — C670 Malignant neoplasm of trigone of bladder: Secondary | ICD-10-CM | POA: Diagnosis not present

## 2016-12-24 DIAGNOSIS — Z936 Other artificial openings of urinary tract status: Secondary | ICD-10-CM | POA: Diagnosis not present

## 2016-12-25 ENCOUNTER — Other Ambulatory Visit (HOSPITAL_COMMUNITY)
Admission: RE | Admit: 2016-12-25 | Discharge: 2016-12-25 | Disposition: A | Discharge status: Home / Self Care | Payer: Medicare Other | Source: Ambulatory Visit | Attending: Internal Medicine | Admitting: Internal Medicine

## 2016-12-25 DIAGNOSIS — I129 Hypertensive chronic kidney disease with stage 1 through stage 4 chronic kidney disease, or unspecified chronic kidney disease: Secondary | ICD-10-CM | POA: Insufficient documentation

## 2016-12-25 DIAGNOSIS — N2581 Secondary hyperparathyroidism of renal origin: Secondary | ICD-10-CM | POA: Insufficient documentation

## 2016-12-25 DIAGNOSIS — N200 Calculus of kidney: Secondary | ICD-10-CM | POA: Insufficient documentation

## 2016-12-25 DIAGNOSIS — N184 Chronic kidney disease, stage 4 (severe): Secondary | ICD-10-CM | POA: Insufficient documentation

## 2016-12-26 LAB — PARATHYROID HORMONE, INTACT (NO CA): PTH: 75 pg/mL — ABNORMAL HIGH (ref 15–65)

## 2016-12-29 ENCOUNTER — Other Ambulatory Visit (HOSPITAL_COMMUNITY)
Admission: RE | Admit: 2016-12-29 | Discharge: 2016-12-29 | Disposition: A | Discharge status: Home / Self Care | Payer: Medicare Other | Source: Ambulatory Visit | Attending: Internal Medicine | Admitting: Internal Medicine

## 2016-12-29 DIAGNOSIS — N184 Chronic kidney disease, stage 4 (severe): Secondary | ICD-10-CM | POA: Insufficient documentation

## 2016-12-29 DIAGNOSIS — I129 Hypertensive chronic kidney disease with stage 1 through stage 4 chronic kidney disease, or unspecified chronic kidney disease: Secondary | ICD-10-CM | POA: Insufficient documentation

## 2016-12-29 DIAGNOSIS — D631 Anemia in chronic kidney disease: Secondary | ICD-10-CM | POA: Insufficient documentation

## 2016-12-29 DIAGNOSIS — N200 Calculus of kidney: Secondary | ICD-10-CM | POA: Diagnosis not present

## 2016-12-29 DIAGNOSIS — N2581 Secondary hyperparathyroidism of renal origin: Secondary | ICD-10-CM | POA: Diagnosis not present

## 2016-12-29 LAB — PROTEIN / CREATININE RATIO, URINE
Creatinine, Urine: 160.63 mg/dL
PROTEIN CREATININE RATIO: 1.18 mg/mg{creat} — AB (ref 0.00–0.15)
Total Protein, Urine: 189 mg/dL

## 2016-12-29 LAB — CBC WITH DIFFERENTIAL/PLATELET
BASOS PCT: 0 %
Basophils Absolute: 0 10*3/uL (ref 0.0–0.1)
Eosinophils Absolute: 0.4 10*3/uL (ref 0.0–0.7)
Eosinophils Relative: 7 %
HEMATOCRIT: 39 % (ref 39.0–52.0)
HEMOGLOBIN: 12.6 g/dL — AB (ref 13.0–17.0)
LYMPHS PCT: 24 %
Lymphs Abs: 1.4 10*3/uL (ref 0.7–4.0)
MCH: 30.4 pg (ref 26.0–34.0)
MCHC: 32.3 g/dL (ref 30.0–36.0)
MCV: 94 fL (ref 78.0–100.0)
MONO ABS: 0.6 10*3/uL (ref 0.1–1.0)
Monocytes Relative: 10 %
NEUTROS ABS: 3.4 10*3/uL (ref 1.7–7.7)
NEUTROS PCT: 58 %
Platelets: 93 10*3/uL — ABNORMAL LOW (ref 150–400)
RBC: 4.15 MIL/uL — ABNORMAL LOW (ref 4.22–5.81)
RDW: 16.9 % — AB (ref 11.5–15.5)
WBC: 5.8 10*3/uL (ref 4.0–10.5)

## 2016-12-29 LAB — URINALYSIS, ROUTINE W REFLEX MICROSCOPIC
BACTERIA UA: NONE SEEN
BILIRUBIN URINE: NEGATIVE
GLUCOSE, UA: 50 mg/dL — AB
KETONES UR: NEGATIVE mg/dL
LEUKOCYTES UA: NEGATIVE
Nitrite: NEGATIVE
PH: 5 (ref 5.0–8.0)
Protein, ur: 100 mg/dL — AB
SQUAMOUS EPITHELIAL / LPF: NONE SEEN
Specific Gravity, Urine: 1.017 (ref 1.005–1.030)

## 2016-12-29 LAB — MAGNESIUM: Magnesium: 1.9 mg/dL (ref 1.7–2.4)

## 2016-12-29 LAB — COMPREHENSIVE METABOLIC PANEL
ALBUMIN: 3.7 g/dL (ref 3.5–5.0)
ALK PHOS: 55 U/L (ref 38–126)
ALT: 12 U/L — ABNORMAL LOW (ref 17–63)
AST: 13 U/L — ABNORMAL LOW (ref 15–41)
Anion gap: 9 (ref 5–15)
BILIRUBIN TOTAL: 1.2 mg/dL (ref 0.3–1.2)
BUN: 29 mg/dL — AB (ref 6–20)
CALCIUM: 8.9 mg/dL (ref 8.9–10.3)
CO2: 26 mmol/L (ref 22–32)
Chloride: 106 mmol/L (ref 101–111)
Creatinine, Ser: 2.44 mg/dL — ABNORMAL HIGH (ref 0.61–1.24)
GFR calc Af Amer: 27 mL/min — ABNORMAL LOW (ref >60)
GFR, EST NON AFRICAN AMERICAN: 23 mL/min — AB (ref >60)
GLUCOSE: 134 mg/dL — AB (ref 65–99)
POTASSIUM: 3.8 mmol/L (ref 3.5–5.1)
Sodium: 141 mmol/L (ref 135–145)
TOTAL PROTEIN: 7.1 g/dL (ref 6.5–8.1)

## 2016-12-29 LAB — IRON AND TIBC
IRON: 80 ug/dL (ref 45–182)
Saturation Ratios: 28 % (ref 17.9–39.5)
TIBC: 281 ug/dL (ref 250–450)
UIBC: 201 ug/dL

## 2016-12-29 LAB — PHOSPHORUS: Phosphorus: 3.1 mg/dL (ref 2.5–4.6)

## 2016-12-29 LAB — URIC ACID: Uric Acid, Serum: 7.2 mg/dL (ref 4.4–7.6)

## 2016-12-29 LAB — FERRITIN: Ferritin: 192 ng/mL (ref 24–336)

## 2016-12-30 LAB — PARATHYROID HORMONE, INTACT (NO CA): PTH: 89 pg/mL — ABNORMAL HIGH (ref 15–65)

## 2016-12-31 ENCOUNTER — Encounter (HOSPITAL_COMMUNITY)
Admission: RE | Admit: 2016-12-31 | Discharge: 2016-12-31 | Disposition: A | Discharge status: Home / Self Care | Payer: Medicare Other | Source: Ambulatory Visit | Attending: Internal Medicine | Admitting: Internal Medicine

## 2016-12-31 DIAGNOSIS — R8299 Other abnormal findings in urine: Secondary | ICD-10-CM | POA: Diagnosis not present

## 2017-01-02 LAB — URINE CULTURE

## 2017-01-04 DIAGNOSIS — J449 Chronic obstructive pulmonary disease, unspecified: Secondary | ICD-10-CM | POA: Diagnosis not present

## 2017-01-07 DIAGNOSIS — D693 Immune thrombocytopenic purpura: Secondary | ICD-10-CM | POA: Diagnosis not present

## 2017-01-07 DIAGNOSIS — D696 Thrombocytopenia, unspecified: Secondary | ICD-10-CM | POA: Diagnosis not present

## 2017-01-07 DIAGNOSIS — N184 Chronic kidney disease, stage 4 (severe): Secondary | ICD-10-CM | POA: Diagnosis not present

## 2017-01-07 DIAGNOSIS — Z85118 Personal history of other malignant neoplasm of bronchus and lung: Secondary | ICD-10-CM | POA: Diagnosis not present

## 2017-01-07 DIAGNOSIS — Z5181 Encounter for therapeutic drug level monitoring: Secondary | ICD-10-CM | POA: Diagnosis not present

## 2017-01-07 DIAGNOSIS — Z5111 Encounter for antineoplastic chemotherapy: Secondary | ICD-10-CM | POA: Diagnosis not present

## 2017-01-07 DIAGNOSIS — C661 Malignant neoplasm of right ureter: Secondary | ICD-10-CM | POA: Diagnosis not present

## 2017-01-07 DIAGNOSIS — Z79899 Other long term (current) drug therapy: Secondary | ICD-10-CM | POA: Diagnosis not present

## 2017-01-07 DIAGNOSIS — E559 Vitamin D deficiency, unspecified: Secondary | ICD-10-CM | POA: Diagnosis not present

## 2017-01-07 DIAGNOSIS — E1122 Type 2 diabetes mellitus with diabetic chronic kidney disease: Secondary | ICD-10-CM | POA: Diagnosis not present

## 2017-01-07 DIAGNOSIS — C689 Malignant neoplasm of urinary organ, unspecified: Secondary | ICD-10-CM | POA: Diagnosis not present

## 2017-01-07 DIAGNOSIS — N135 Crossing vessel and stricture of ureter without hydronephrosis: Secondary | ICD-10-CM | POA: Diagnosis not present

## 2017-01-07 DIAGNOSIS — N200 Calculus of kidney: Secondary | ICD-10-CM | POA: Diagnosis not present

## 2017-01-07 DIAGNOSIS — Z794 Long term (current) use of insulin: Secondary | ICD-10-CM | POA: Diagnosis not present

## 2017-01-07 DIAGNOSIS — I129 Hypertensive chronic kidney disease with stage 1 through stage 4 chronic kidney disease, or unspecified chronic kidney disease: Secondary | ICD-10-CM | POA: Diagnosis not present

## 2017-01-08 DIAGNOSIS — Z466 Encounter for fitting and adjustment of urinary device: Secondary | ICD-10-CM | POA: Diagnosis not present

## 2017-01-08 DIAGNOSIS — N200 Calculus of kidney: Secondary | ICD-10-CM | POA: Diagnosis not present

## 2017-01-09 DIAGNOSIS — G4733 Obstructive sleep apnea (adult) (pediatric): Secondary | ICD-10-CM | POA: Diagnosis not present

## 2017-01-19 ENCOUNTER — Other Ambulatory Visit: Payer: Self-pay | Admitting: *Deleted

## 2017-01-19 MED ORDER — METOPROLOL TARTRATE 50 MG PO TABS: ORAL_TABLET | ORAL | 1 refills | Status: DC

## 2017-01-22 ENCOUNTER — Other Ambulatory Visit: Payer: Self-pay | Admitting: Licensed Clinical Social Worker

## 2017-01-22 ENCOUNTER — Other Ambulatory Visit (HOSPITAL_COMMUNITY): Payer: Medicare Other

## 2017-01-22 NOTE — Patient Outreach (Signed)
Assessment:  CSW spoke via phone with [name]. CSW verified identity of Carvin Almas. [name] and CSW spoke of client needs.Client sees Dr. Grace Bushy. Luking as primary care doctor. Client has prescribed medications and is taking medications as prescribed. Client had been receiving home health services as scheduled with [facility]. Client has now completed all home health services with [facility].  [name], spouse of client, is transporting client to and from client's scheduled medical appointments.  Client is receiving [facility] nursing support with RN [name]. CSW and [name] spoke of client care plan. CSW encouraged that client communicate with CSW in next 30 days to discuss community resources of assistance for client. Client is sleeping well and has adequate appetite. Client is attending scheduled client medical appointments. [name] reported that client is not having any pain issues at present.   Client is attending scheduled appointments at [facility] for immunotherapy treatments for client. CSW spoke with [name] about her or client contacting Patient Relations Department at [facility] as needed to discuss recent client care issues or concerns while receiving care at that hospital. [name] and [place] spoke of client supplies for nephrostomy tube care for client. She said she would call [facility] as needed to inquire about supplies for nephrostomy tube care for client. Client and [name] are appreciative of [facility] program support. Client is seeing a cardiologist as scheduled.  Client  attends appointments with Dr. Wolfgang Phoenix as scheduled. CSW thanked [place] for phone call with CSW on 01/22/17.  CSW encouraged that [name] or client call RN [name] or CSW Theadore Nan as needed to discuss needs of client.    Plan:  Client to communicate with CSW in next 30 days to discuss community resources of assistance for client.  CSW to collaborate  with RN [name] as needed to monitor ongoing needs of client.  CSW to call client or [place] in 4 weeks to assess client needs at that time.  [name].Tatanisha Cuthbert MSW, LCSW Licensed Clinical Social Worker [facility] Care Management [phone]

## 2017-01-26 ENCOUNTER — Ambulatory Visit (INDEPENDENT_AMBULATORY_CARE_PROVIDER_SITE_OTHER): Payer: Medicare Other | Admitting: Family Medicine

## 2017-01-26 ENCOUNTER — Encounter: Payer: Self-pay | Admitting: Family Medicine

## 2017-01-26 VITALS — BP 126/70 | Ht 72.0 in | Wt 266.0 lb

## 2017-01-26 DIAGNOSIS — N184 Chronic kidney disease, stage 4 (severe): Secondary | ICD-10-CM

## 2017-01-26 DIAGNOSIS — I10 Essential (primary) hypertension: Secondary | ICD-10-CM

## 2017-01-26 DIAGNOSIS — E785 Hyperlipidemia, unspecified: Secondary | ICD-10-CM

## 2017-01-26 DIAGNOSIS — F3341 Major depressive disorder, recurrent, in partial remission: Secondary | ICD-10-CM | POA: Diagnosis not present

## 2017-01-26 DIAGNOSIS — G4733 Obstructive sleep apnea (adult) (pediatric): Secondary | ICD-10-CM | POA: Diagnosis not present

## 2017-01-26 DIAGNOSIS — E119 Type 2 diabetes mellitus without complications: Secondary | ICD-10-CM

## 2017-01-26 DIAGNOSIS — N189 Chronic kidney disease, unspecified: Secondary | ICD-10-CM | POA: Diagnosis not present

## 2017-01-26 LAB — POCT GLYCOSYLATED HEMOGLOBIN (HGB A1C): Hemoglobin A1C: 5.9

## 2017-01-26 NOTE — Progress Notes (Signed)
   Subjective:    Patient ID: Kyle [name], male    DOB: 1934/03/05, 83 y.o.   MRN: [id number]  Diabetes  He presents for his follow-up diabetic visit. He has type 2 diabetes mellitus. His disease course has been stable. Associated symptoms include fatigue. Symptoms are stable. He is compliant with treatment none of the time. His weight is stable. He is following a generally unhealthy diet. He has not had a previous visit with a dietitian. He participates in exercise daily. He does not see a podiatrist.Eye exam is current.   Patient claims compliance with diabetes medication. No obvious side effects. Reports no substantial low sugar spells. Most numbers are generally in good range when checked fasting. Generally does not miss a dose of medication. Watching diabetic diet closely  Blood pressure medicine and blood pressure levels reviewed today with patient. Compliant with blood pressure medicine. States does not miss a dose. No obvious side effects. Blood pressure generally good when checked elsewhere. Watching salt intake.   Patient continues to take lipid medication regularly. No obvious side effects from it. Generally does not miss a dose. Prior blood work results are reviewed with patient. Patient continues to work on fat intake in diet  Patient notes ongoing compliance with antidepressant medication. No obvious side effects. Reports does not miss a dose. Overall continues to help depression substantially. No thoughts of homicide or suicide. Would like to maintain medication.  Breathing is so so, not the best, using inhaler some. Perhaps once every 3 days.  Notes generalized fatigue. Building over recent years. Appears to be worse. Not exercising much these days. Low back pain diffuse in nature worse with prolonged standing. Minimal radiation to legs. Takes approximately on 1 Tylenol   No complaints Results for orders placed or performed in visit on 01/26/17  POCT glycosylated hemoglobin  (Hb A1C)  Result Value Ref Range   Hemoglobin A1C 5.9     Review of Systems  Constitutional: Positive for fatigue.  No headache, no major weight loss or weight gain, no chest pain no back pain abdominal pain no change in bowel habits complete ROS otherwise negative      Objective:   Physical Exam  Alert and oriented, vitals reviewed and stable, NAD ENT-TM's and ext canals WNL bilat via otoscopic exam Soft palate, tonsils and post pharynx WNL via oropharyngeal exam Neck-symmetric, no masses; thyroid nonpalpable and nontender Pulmonary-no tachypnea or accessory muscle use; Clear without wheezes via auscultation, Breath sounds somewhat distant presumably secondary to COPD this is diffuse Card--no abnrml murmurs, rhythm reg and rate WNL Carotid pulses symmetric, without bruits Right flank nephrostomy tube wound site within normal limits.      Assessment & Plan:  Impression 1 type 2 diabetes good control discussed maintain same #2 hypertension good control discussed maintain same #3 COPD ongoing discussed #4 hyperlipidemia good control discussed maintain 5 depression chronic in nature discussed overall stable to maintain same meds #patient's son brings along list of issues that weight for is claimed we have not done in terms of his primary care. All issues were done.  Greater than 50% of this 40 minute face to face visit was spent in counseling and discussion and coordination of care regarding the above diagnosis/diagnosies  Follow-up 3 months for wellness plus chronic

## 2017-01-28 DIAGNOSIS — C661 Malignant neoplasm of right ureter: Secondary | ICD-10-CM | POA: Diagnosis not present

## 2017-01-28 DIAGNOSIS — Z79899 Other long term (current) drug therapy: Secondary | ICD-10-CM | POA: Diagnosis not present

## 2017-01-28 DIAGNOSIS — Z5181 Encounter for therapeutic drug level monitoring: Secondary | ICD-10-CM | POA: Diagnosis not present

## 2017-01-28 DIAGNOSIS — C679 Malignant neoplasm of bladder, unspecified: Secondary | ICD-10-CM | POA: Diagnosis not present

## 2017-01-28 DIAGNOSIS — N135 Crossing vessel and stricture of ureter without hydronephrosis: Secondary | ICD-10-CM | POA: Diagnosis not present

## 2017-01-28 DIAGNOSIS — Z5111 Encounter for antineoplastic chemotherapy: Secondary | ICD-10-CM | POA: Diagnosis not present

## 2017-01-29 ENCOUNTER — Ambulatory Visit (HOSPITAL_COMMUNITY): Admission: RE | Admit: 2017-01-29 | Payer: Medicare Other | Source: Ambulatory Visit

## 2017-01-30 DIAGNOSIS — Z8554 Personal history of malignant neoplasm of ureter: Secondary | ICD-10-CM | POA: Diagnosis not present

## 2017-01-30 DIAGNOSIS — Z8551 Personal history of malignant neoplasm of bladder: Secondary | ICD-10-CM | POA: Diagnosis not present

## 2017-02-03 DIAGNOSIS — J449 Chronic obstructive pulmonary disease, unspecified: Secondary | ICD-10-CM | POA: Diagnosis not present

## 2017-02-03 DIAGNOSIS — Z936 Other artificial openings of urinary tract status: Secondary | ICD-10-CM | POA: Diagnosis not present

## 2017-02-08 DIAGNOSIS — G4733 Obstructive sleep apnea (adult) (pediatric): Secondary | ICD-10-CM | POA: Diagnosis not present

## 2017-02-09 ENCOUNTER — Telehealth: Payer: Self-pay | Admitting: Family Medicine

## 2017-02-09 ENCOUNTER — Other Ambulatory Visit: Payer: Self-pay | Admitting: Family Medicine

## 2017-02-09 MED ORDER — PRAVASTATIN SODIUM 80 MG PO TABS: 80.0000 mg | ORAL_TABLET | Freq: Every day | ORAL | 1 refills | Status: DC

## 2017-02-09 NOTE — Telephone Encounter (Signed)
Yes one qhs six mo

## 2017-02-09 NOTE — Telephone Encounter (Signed)
Sorry pt has six or so specialistw and I have no idea which dose he currently is taking, may need to call pt or pharmacy

## 2017-02-09 NOTE — Telephone Encounter (Signed)
Patient sent over a request for Pravastatin 80 MG. I see Pravastatin 20 MG on med list by historical provider. May we refill Pravastatin if so which dose?

## 2017-02-09 NOTE — Telephone Encounter (Signed)
Called pharmacy and was told that patient takes 80 MG . 80 MG sent into pharmacy per Dr.Steve Luking. Patient notified.

## 2017-02-09 NOTE — Telephone Encounter (Signed)
Requesting Rx for Pravastatin to [facility].

## 2017-02-09 NOTE — Telephone Encounter (Signed)
The 20 MG ?

## 2017-02-18 ENCOUNTER — Other Ambulatory Visit: Payer: Self-pay | Admitting: Licensed Clinical Social Worker

## 2017-02-18 DIAGNOSIS — L299 Pruritus, unspecified: Secondary | ICD-10-CM | POA: Diagnosis not present

## 2017-02-18 DIAGNOSIS — Z436 Encounter for attention to other artificial openings of urinary tract: Secondary | ICD-10-CM | POA: Diagnosis not present

## 2017-02-18 DIAGNOSIS — Z79899 Other long term (current) drug therapy: Secondary | ICD-10-CM | POA: Diagnosis not present

## 2017-02-18 DIAGNOSIS — Z936 Other artificial openings of urinary tract status: Secondary | ICD-10-CM | POA: Diagnosis not present

## 2017-02-18 DIAGNOSIS — N135 Crossing vessel and stricture of ureter without hydronephrosis: Secondary | ICD-10-CM | POA: Diagnosis not present

## 2017-02-18 DIAGNOSIS — C672 Malignant neoplasm of lateral wall of bladder: Secondary | ICD-10-CM | POA: Diagnosis not present

## 2017-02-18 DIAGNOSIS — N320 Bladder-neck obstruction: Secondary | ICD-10-CM | POA: Diagnosis not present

## 2017-02-18 DIAGNOSIS — C679 Malignant neoplasm of bladder, unspecified: Secondary | ICD-10-CM | POA: Diagnosis not present

## 2017-02-18 DIAGNOSIS — C661 Malignant neoplasm of right ureter: Secondary | ICD-10-CM | POA: Diagnosis not present

## 2017-02-18 NOTE — Patient Outreach (Signed)
Assessment:  CSW spoke via phone with Kyle [name], spouse of client. CSW verified identity of Kyle [name]. CSW and [name] spoke of client needs.  Client has support from [employer]. Client has prescribed medications and is taking medications as prescribed. Client see Dr. Baltazar Apo as primary care doctor. Client has been attending scheduled client medical appointments. Client experiences periodic fatigue and has to take rest breaks as needed. His wife transports client to and from clients scheduled medical appointments. CSW and [name] spoke of client care plan. CSW encouraged that client or Kyle [name] communicate with CSW in next 30 days to discuss community resources of assistance for client. Client is sleeping well and has adequate appetite. Client has been attending scheduled client treatments at [facility] for immunotherapy treatments for client. Client is seeing a cardiologist as scheduled. Client sees pulmonologist annually as scheduled. Kyle [name] said that client has nephrostomy tube and has nephrostomy tube changed at [facility] every 6 weeks.  Kyle [name] said client is taking medications as prescribed.  CSW thanked [place] for phone call with CSW on 02/18/17. CSW encouraged that Kyle [name] or client call CSW at [phone] as needed to discuss social work needs of client.    Plan:  Client or Kyle [name] to communicate with CSW in next 30 days to discuss community resources of assistance for client.  CSW to call client or Kyle [name] in 4 weeks to assess client needs at that time.  [name].[name] MSW, LCSW Licensed Clinical Social Worker [facility] Care Management [phone]

## 2017-02-20 ENCOUNTER — Ambulatory Visit: Payer: Self-pay | Admitting: Licensed Clinical Social Worker

## 2017-02-23 DIAGNOSIS — I10 Essential (primary) hypertension: Secondary | ICD-10-CM | POA: Diagnosis not present

## 2017-02-23 DIAGNOSIS — N184 Chronic kidney disease, stage 4 (severe): Secondary | ICD-10-CM | POA: Diagnosis not present

## 2017-02-23 DIAGNOSIS — D631 Anemia in chronic kidney disease: Secondary | ICD-10-CM | POA: Diagnosis not present

## 2017-02-23 DIAGNOSIS — N200 Calculus of kidney: Secondary | ICD-10-CM | POA: Diagnosis not present

## 2017-02-23 DIAGNOSIS — N2581 Secondary hyperparathyroidism of renal origin: Secondary | ICD-10-CM | POA: Diagnosis not present

## 2017-03-02 ENCOUNTER — Other Ambulatory Visit: Payer: Self-pay | Admitting: Family Medicine

## 2017-03-03 DIAGNOSIS — Z936 Other artificial openings of urinary tract status: Secondary | ICD-10-CM | POA: Diagnosis not present

## 2017-03-03 NOTE — Telephone Encounter (Signed)
Last seen 01/19/2017

## 2017-03-06 DIAGNOSIS — J449 Chronic obstructive pulmonary disease, unspecified: Secondary | ICD-10-CM | POA: Diagnosis not present

## 2017-03-09 ENCOUNTER — Ambulatory Visit (INDEPENDENT_AMBULATORY_CARE_PROVIDER_SITE_OTHER): Payer: Medicare Other | Admitting: Otolaryngology

## 2017-03-09 DIAGNOSIS — H903 Sensorineural hearing loss, bilateral: Secondary | ICD-10-CM

## 2017-03-11 DIAGNOSIS — C672 Malignant neoplasm of lateral wall of bladder: Secondary | ICD-10-CM | POA: Diagnosis not present

## 2017-03-11 DIAGNOSIS — L299 Pruritus, unspecified: Secondary | ICD-10-CM | POA: Diagnosis not present

## 2017-03-11 DIAGNOSIS — Z923 Personal history of irradiation: Secondary | ICD-10-CM | POA: Diagnosis not present

## 2017-03-11 DIAGNOSIS — I2510 Atherosclerotic heart disease of native coronary artery without angina pectoris: Secondary | ICD-10-CM | POA: Diagnosis not present

## 2017-03-11 DIAGNOSIS — Z936 Other artificial openings of urinary tract status: Secondary | ICD-10-CM | POA: Diagnosis not present

## 2017-03-11 DIAGNOSIS — N135 Crossing vessel and stricture of ureter without hydronephrosis: Secondary | ICD-10-CM | POA: Diagnosis not present

## 2017-03-11 DIAGNOSIS — J449 Chronic obstructive pulmonary disease, unspecified: Secondary | ICD-10-CM | POA: Diagnosis not present

## 2017-03-11 DIAGNOSIS — Z85118 Personal history of other malignant neoplasm of bronchus and lung: Secondary | ICD-10-CM | POA: Diagnosis not present

## 2017-03-11 DIAGNOSIS — C679 Malignant neoplasm of bladder, unspecified: Secondary | ICD-10-CM | POA: Diagnosis not present

## 2017-03-11 DIAGNOSIS — G4733 Obstructive sleep apnea (adult) (pediatric): Secondary | ICD-10-CM | POA: Diagnosis not present

## 2017-03-11 DIAGNOSIS — C661 Malignant neoplasm of right ureter: Secondary | ICD-10-CM | POA: Diagnosis not present

## 2017-03-11 DIAGNOSIS — C689 Malignant neoplasm of urinary organ, unspecified: Secondary | ICD-10-CM | POA: Diagnosis not present

## 2017-03-11 DIAGNOSIS — Z5112 Encounter for antineoplastic immunotherapy: Secondary | ICD-10-CM | POA: Diagnosis not present

## 2017-03-11 DIAGNOSIS — Z902 Acquired absence of lung [part of]: Secondary | ICD-10-CM | POA: Diagnosis not present

## 2017-03-19 ENCOUNTER — Other Ambulatory Visit: Payer: Self-pay | Admitting: Licensed Clinical Social Worker

## 2017-03-19 NOTE — Patient Outreach (Signed)
Assessment:  CSW spoke via phone with Kyle [name], spouse of client.  CSW verified identity of Kyle [name]. Kyle [name] and CSW spoke of client needs. Client sees Dr. Baltazar Apo as primary care doctor.  Client is trying to attend scheduled client medical appointments. Client has prescribed medications and is taking medications as prescribed. Client is eating adequately and sleeping adequately.  Client fatigues occasionally and has to take periodic rest breaks. Kyle [name] transports client to and from client's scheduled medical appointments. CSW and Kyle [name] spoke of client care plan. CSW encouraged that client or Kyle [name] communicate with CSW in next 30 days to discuss community resources of assistance for client.  Client has been attending scheduled appointments for client at [facility] for immunotherapy treatments for client. Kyle [name] said that these treatments for client at [facility] make client weak and fatigued. She said he has to take rest periods following these treatments.  Client is seeing a cardiologist as scheduled. Client is seeing a pulmonologist annually as scheduled. Client had a nephrostomy tube placed in April of 2018. Kyle [name] said client goes to [facility] as scheduled for changing of nephrostomy tube for client. Client uses a walker to ambulate. Kyle [name] said client may possibly have two more immunotherapy treatments for client at [facility]. She said client did not have any pain issues at present.  CSW thanked Kyle [name] for phone call with CSW on 03/19/17.  CSW encouraged Kyle [name] or client to call CSW at [phone] as needed to discuss social work needs of client.   Plan:  Client or Kyle [name] to communicate with CSW in next 30 days to discuss community resources of assistance for client.   CSW to call client or Kyle [name] in 4 weeks to assess client needs at that time.  [name].[name] MSW, LCSW Licensed Clinical  Social Worker [facility] Care Management [phone]

## 2017-03-25 DIAGNOSIS — I10 Essential (primary) hypertension: Secondary | ICD-10-CM | POA: Diagnosis not present

## 2017-03-25 DIAGNOSIS — N189 Chronic kidney disease, unspecified: Secondary | ICD-10-CM | POA: Diagnosis not present

## 2017-03-30 DIAGNOSIS — L309 Dermatitis, unspecified: Secondary | ICD-10-CM | POA: Diagnosis not present

## 2017-04-01 DIAGNOSIS — L299 Pruritus, unspecified: Secondary | ICD-10-CM | POA: Diagnosis not present

## 2017-04-01 DIAGNOSIS — Z79899 Other long term (current) drug therapy: Secondary | ICD-10-CM | POA: Diagnosis not present

## 2017-04-01 DIAGNOSIS — Z883 Allergy status to other anti-infective agents status: Secondary | ICD-10-CM | POA: Diagnosis not present

## 2017-04-01 DIAGNOSIS — C679 Malignant neoplasm of bladder, unspecified: Secondary | ICD-10-CM | POA: Diagnosis not present

## 2017-04-01 DIAGNOSIS — G4733 Obstructive sleep apnea (adult) (pediatric): Secondary | ICD-10-CM | POA: Diagnosis not present

## 2017-04-01 DIAGNOSIS — Z466 Encounter for fitting and adjustment of urinary device: Secondary | ICD-10-CM | POA: Diagnosis not present

## 2017-04-01 DIAGNOSIS — N135 Crossing vessel and stricture of ureter without hydronephrosis: Secondary | ICD-10-CM | POA: Diagnosis not present

## 2017-04-01 DIAGNOSIS — Z885 Allergy status to narcotic agent status: Secondary | ICD-10-CM | POA: Diagnosis not present

## 2017-04-01 DIAGNOSIS — C689 Malignant neoplasm of urinary organ, unspecified: Secondary | ICD-10-CM | POA: Diagnosis not present

## 2017-04-01 DIAGNOSIS — C672 Malignant neoplasm of lateral wall of bladder: Secondary | ICD-10-CM | POA: Diagnosis not present

## 2017-04-01 DIAGNOSIS — Z9889 Other specified postprocedural states: Secondary | ICD-10-CM | POA: Diagnosis not present

## 2017-04-01 DIAGNOSIS — Z7982 Long term (current) use of aspirin: Secondary | ICD-10-CM | POA: Diagnosis not present

## 2017-04-01 DIAGNOSIS — E1122 Type 2 diabetes mellitus with diabetic chronic kidney disease: Secondary | ICD-10-CM | POA: Diagnosis not present

## 2017-04-01 DIAGNOSIS — J449 Chronic obstructive pulmonary disease, unspecified: Secondary | ICD-10-CM | POA: Diagnosis not present

## 2017-04-01 DIAGNOSIS — C661 Malignant neoplasm of right ureter: Secondary | ICD-10-CM | POA: Diagnosis not present

## 2017-04-01 DIAGNOSIS — I2510 Atherosclerotic heart disease of native coronary artery without angina pectoris: Secondary | ICD-10-CM | POA: Diagnosis not present

## 2017-04-01 DIAGNOSIS — N184 Chronic kidney disease, stage 4 (severe): Secondary | ICD-10-CM | POA: Diagnosis not present

## 2017-04-01 DIAGNOSIS — Z86010 Personal history of colonic polyps: Secondary | ICD-10-CM | POA: Diagnosis not present

## 2017-04-01 DIAGNOSIS — I129 Hypertensive chronic kidney disease with stage 1 through stage 4 chronic kidney disease, or unspecified chronic kidney disease: Secondary | ICD-10-CM | POA: Diagnosis not present

## 2017-04-01 DIAGNOSIS — Z9981 Dependence on supplemental oxygen: Secondary | ICD-10-CM | POA: Diagnosis not present

## 2017-04-01 DIAGNOSIS — C670 Malignant neoplasm of trigone of bladder: Secondary | ICD-10-CM | POA: Diagnosis not present

## 2017-04-01 DIAGNOSIS — Z5112 Encounter for antineoplastic immunotherapy: Secondary | ICD-10-CM | POA: Diagnosis not present

## 2017-04-01 DIAGNOSIS — Z7951 Long term (current) use of inhaled steroids: Secondary | ICD-10-CM | POA: Diagnosis not present

## 2017-04-06 DIAGNOSIS — J449 Chronic obstructive pulmonary disease, unspecified: Secondary | ICD-10-CM | POA: Diagnosis not present

## 2017-04-08 DIAGNOSIS — N3020 Other chronic cystitis without hematuria: Secondary | ICD-10-CM | POA: Diagnosis not present

## 2017-04-08 DIAGNOSIS — N200 Calculus of kidney: Secondary | ICD-10-CM | POA: Diagnosis not present

## 2017-04-08 DIAGNOSIS — C670 Malignant neoplasm of trigone of bladder: Secondary | ICD-10-CM | POA: Diagnosis not present

## 2017-04-08 DIAGNOSIS — Z8679 Personal history of other diseases of the circulatory system: Secondary | ICD-10-CM | POA: Diagnosis not present

## 2017-04-08 DIAGNOSIS — R609 Edema, unspecified: Secondary | ICD-10-CM | POA: Diagnosis not present

## 2017-04-08 DIAGNOSIS — Z7984 Long term (current) use of oral hypoglycemic drugs: Secondary | ICD-10-CM | POA: Diagnosis not present

## 2017-04-08 DIAGNOSIS — C672 Malignant neoplasm of lateral wall of bladder: Secondary | ICD-10-CM | POA: Diagnosis not present

## 2017-04-08 DIAGNOSIS — Z9889 Other specified postprocedural states: Secondary | ICD-10-CM | POA: Diagnosis not present

## 2017-04-08 DIAGNOSIS — N184 Chronic kidney disease, stage 4 (severe): Secondary | ICD-10-CM | POA: Diagnosis not present

## 2017-04-08 DIAGNOSIS — C661 Malignant neoplasm of right ureter: Secondary | ICD-10-CM | POA: Diagnosis not present

## 2017-04-08 DIAGNOSIS — I129 Hypertensive chronic kidney disease with stage 1 through stage 4 chronic kidney disease, or unspecified chronic kidney disease: Secondary | ICD-10-CM | POA: Diagnosis not present

## 2017-04-08 DIAGNOSIS — C675 Malignant neoplasm of bladder neck: Secondary | ICD-10-CM | POA: Diagnosis not present

## 2017-04-08 DIAGNOSIS — G4733 Obstructive sleep apnea (adult) (pediatric): Secondary | ICD-10-CM | POA: Diagnosis not present

## 2017-04-08 DIAGNOSIS — N2581 Secondary hyperparathyroidism of renal origin: Secondary | ICD-10-CM | POA: Diagnosis not present

## 2017-04-08 DIAGNOSIS — Z79899 Other long term (current) drug therapy: Secondary | ICD-10-CM | POA: Diagnosis not present

## 2017-04-08 DIAGNOSIS — J449 Chronic obstructive pulmonary disease, unspecified: Secondary | ICD-10-CM | POA: Diagnosis not present

## 2017-04-08 DIAGNOSIS — Z85118 Personal history of other malignant neoplasm of bronchus and lung: Secondary | ICD-10-CM | POA: Diagnosis not present

## 2017-04-08 DIAGNOSIS — E785 Hyperlipidemia, unspecified: Secondary | ICD-10-CM | POA: Diagnosis not present

## 2017-04-08 DIAGNOSIS — E1122 Type 2 diabetes mellitus with diabetic chronic kidney disease: Secondary | ICD-10-CM | POA: Diagnosis not present

## 2017-04-11 DIAGNOSIS — G4733 Obstructive sleep apnea (adult) (pediatric): Secondary | ICD-10-CM | POA: Diagnosis not present

## 2017-04-13 DIAGNOSIS — N189 Chronic kidney disease, unspecified: Secondary | ICD-10-CM | POA: Diagnosis not present

## 2017-04-15 DIAGNOSIS — C672 Malignant neoplasm of lateral wall of bladder: Secondary | ICD-10-CM | POA: Diagnosis not present

## 2017-04-15 DIAGNOSIS — Z466 Encounter for fitting and adjustment of urinary device: Secondary | ICD-10-CM | POA: Diagnosis not present

## 2017-04-15 DIAGNOSIS — C675 Malignant neoplasm of bladder neck: Secondary | ICD-10-CM | POA: Diagnosis not present

## 2017-04-15 DIAGNOSIS — N135 Crossing vessel and stricture of ureter without hydronephrosis: Secondary | ICD-10-CM | POA: Diagnosis not present

## 2017-04-15 DIAGNOSIS — Z436 Encounter for attention to other artificial openings of urinary tract: Secondary | ICD-10-CM | POA: Diagnosis not present

## 2017-04-15 DIAGNOSIS — N3020 Other chronic cystitis without hematuria: Secondary | ICD-10-CM | POA: Diagnosis not present

## 2017-04-15 DIAGNOSIS — C679 Malignant neoplasm of bladder, unspecified: Secondary | ICD-10-CM | POA: Diagnosis not present

## 2017-04-15 DIAGNOSIS — C661 Malignant neoplasm of right ureter: Secondary | ICD-10-CM | POA: Diagnosis not present

## 2017-04-21 ENCOUNTER — Other Ambulatory Visit: Payer: Self-pay | Admitting: Licensed Clinical Social Worker

## 2017-04-21 NOTE — Patient Outreach (Signed)
Assessment:  CSW spoke via phone with Kyle [name]. CSW verified identity of Kyle [name]. Kyle [name] and CSW spoke of client needs. Client sees Dr. Grace Bushy. Luking as primary care doctor.Client has prescribed medications and is taking medications as prescribed.  Client is eating adequately and sleeping adequately.  Client is trying to attend all scheduled client medical appointments. Client fatigues occasionally and has to take periodic rest breaks. Kyle [name] transports client to and from client's scheduled medical appointments. CSW and Kyle [name] spoke of client's care plan. CSW encouraged that client or Kyle [name] communicate with CSW in next 30 days to discuss community resources of assistance for client. Client sees a cardiologist as scheduled. Client has also been attending scheduled appointments at [facility] for immunotherapy treatments.  Client uses a walker or a cane as needed to help him to ambulate. Client had a nephrostomy tube placed in April of 2018. Client goes to [facility] as scheduled for nephrostomy tube care.  Client has not mentioned any pain issues. Client may need to go to dentist to address dental needs of client. Client has a Pharmacist, community in [place], [place] who provides dental care for client.  Client has had numerous medical appointments recently but client is trying to attend all scheduled client medical appointments. CSW thanked [place] for phone call with CSW on 04/21/17. CSW encouraged that Kyle [name] or client please call CSW at [phone] as needed to address social work needs of client.    Plan:  Client or Kyle [name] to communicate with CSW in next 30 days to discuss community resources of assistance for client.  CSW to call client or Kyle [name] in 4 weeks to assess client needs at that time.  Kyle [name].Kyle [name] MSW, LCSW Licensed Clinical Social Worker [facility] Care Management [phone]

## 2017-04-28 DIAGNOSIS — N189 Chronic kidney disease, unspecified: Secondary | ICD-10-CM | POA: Diagnosis not present

## 2017-04-28 DIAGNOSIS — I10 Essential (primary) hypertension: Secondary | ICD-10-CM | POA: Diagnosis not present

## 2017-04-29 ENCOUNTER — Encounter: Payer: Medicare Other | Admitting: Family Medicine

## 2017-05-04 ENCOUNTER — Ambulatory Visit (INDEPENDENT_AMBULATORY_CARE_PROVIDER_SITE_OTHER): Payer: Medicare Other | Admitting: Family Medicine

## 2017-05-04 ENCOUNTER — Encounter: Payer: Self-pay | Admitting: Family Medicine

## 2017-05-04 ENCOUNTER — Other Ambulatory Visit: Payer: Self-pay | Admitting: Dermatology

## 2017-05-04 VITALS — BP 120/70 | Ht 72.0 in | Wt 260.5 lb

## 2017-05-04 DIAGNOSIS — Z13220 Encounter for screening for lipoid disorders: Secondary | ICD-10-CM | POA: Diagnosis not present

## 2017-05-04 DIAGNOSIS — L570 Actinic keratosis: Secondary | ICD-10-CM | POA: Diagnosis not present

## 2017-05-04 DIAGNOSIS — E785 Hyperlipidemia, unspecified: Secondary | ICD-10-CM | POA: Diagnosis not present

## 2017-05-04 DIAGNOSIS — R531 Weakness: Secondary | ICD-10-CM

## 2017-05-04 DIAGNOSIS — I10 Essential (primary) hypertension: Secondary | ICD-10-CM | POA: Diagnosis not present

## 2017-05-04 DIAGNOSIS — R269 Unspecified abnormalities of gait and mobility: Secondary | ICD-10-CM

## 2017-05-04 DIAGNOSIS — Z9989 Dependence on other enabling machines and devices: Secondary | ICD-10-CM | POA: Diagnosis not present

## 2017-05-04 DIAGNOSIS — Z23 Encounter for immunization: Secondary | ICD-10-CM

## 2017-05-04 DIAGNOSIS — G4733 Obstructive sleep apnea (adult) (pediatric): Secondary | ICD-10-CM

## 2017-05-04 DIAGNOSIS — J431 Panlobular emphysema: Secondary | ICD-10-CM

## 2017-05-04 DIAGNOSIS — Z0000 Encounter for general adult medical examination without abnormal findings: Secondary | ICD-10-CM | POA: Diagnosis not present

## 2017-05-04 DIAGNOSIS — Z936 Other artificial openings of urinary tract status: Secondary | ICD-10-CM | POA: Diagnosis not present

## 2017-05-04 DIAGNOSIS — E119 Type 2 diabetes mellitus without complications: Secondary | ICD-10-CM

## 2017-05-04 DIAGNOSIS — D492 Neoplasm of unspecified behavior of bone, soft tissue, and skin: Secondary | ICD-10-CM | POA: Diagnosis not present

## 2017-05-04 LAB — POCT GLYCOSYLATED HEMOGLOBIN (HGB A1C): Hemoglobin A1C: 4.9

## 2017-05-04 MED ORDER — FLUTICASONE PROPIONATE 50 MCG/ACT NA SUSP: 1.0000 | Freq: Every day | NASAL | 5 refills | Status: DC

## 2017-05-04 NOTE — Progress Notes (Signed)
Subjective:    Patient ID: Kyle [name], male    DOB: [birth year], 83 y.o.   MRN: [id number]  HPI AWV- Annual Wellness Visit  The patient was seen for their annual wellness visit. The patient's past medical history, surgical history, and family history were reviewed. Pertinent vaccines were reviewed ( tetanus, pneumonia, shingles, flu) The patient's medication list was reviewed and updated.  The height and weight were entered. The patient's current BMI is:  Cognitive screening was completed. Outcome of Mini - Cog: good  Falls within the past 6 months: None   Current tobacco usage: Yes  (All patients who use tobacco were given written and verbal information on quitting)  Recent listing of emergency department/hospitalizations over the past year were reviewed.  current specialist the patient sees on a regular basis: urology Dr.Davis, Dr.Biting oncology.    Medicare annual wellness visit patient questionnaire was reviewed.  A written screening schedule for the patient for the next 5-10 years was given. Appropriate discussion of followup regarding next visit was discussed.  Patient's son would like for patient to go to Physical therapy for gait training.   Results for orders placed or performed in visit on 05/04/17  POCT HgB A1C  Result Value Ref Range   Hemoglobin A1C 4.9    Felt sob the other night with no cpap device with elec out  Glu excellent  Flu shot due today  Pt noting sig qeakness with walking, needing more and more hhelp for getting out of chairs, not exercising much these days, family would like to try 85 therap y  fam would like to have p t referral    overall diet is beter, can nt eat cttain meds,  Renal and urology and  oncolgist folowing pt,  Pt receiving immunotherapy treatmrnt for uro carcinoma, so far seems to be working with the cancer   Blood pressure medicine and blood pressure levels reviewed today with patient. Compliant with blood  pressure medicine. States does not miss a dose. No obvious side effects. Blood pressure generally good when checked elsewhere. Watching salt intake.   Patient continues to take lipid medication regularly. No obvious side effects from it. Generally does not miss a dose. Prior blood work results are reviewed with patient. Patient continues to work on fat intake in diet  Patient notes generalized weakness. Having more difficulty getting out of the chair. Having use a cane. Not walking much.  Patient claims compliance with diabetes medication. No obvious side effects. Reports no substantial low sugar spells. Most numbers are generally in good range when checked fasting. Generally does not miss a dose of medication. Watching diabetic diet closely   Review of Systems  Constitutional: Negative for activity change, appetite change and fever.  HENT: Negative for congestion and rhinorrhea.   Eyes: Negative for discharge.  Respiratory: Negative for cough and wheezing.   Cardiovascular: Negative for chest pain.  Gastrointestinal: Negative for abdominal pain, blood in stool and vomiting.  Genitourinary: Negative for difficulty urinating and frequency.  Musculoskeletal: Negative for neck pain.  Skin: Negative for rash.  Allergic/Immunologic: Negative for environmental allergies and food allergies.  Neurological: Negative for weakness and headaches.  Psychiatric/Behavioral: Negative for agitation.  All other systems reviewed and are negative.      Objective:   Physical Exam  Constitutional: He appears well-developed and well-nourished.  Obesity present  HENT:  Head: Normocephalic and atraumatic.  Right Ear: External ear normal.  Left Ear: External ear normal.  Nose: Nose normal.  Mouth/Throat: Oropharynx is clear and moist.  Eyes: Pupils are equal, round, and reactive to light. EOM are normal.  Neck: Normal range of motion. Neck supple. No thyromegaly present.  Cardiovascular: Normal rate,  regular rhythm and normal heart sounds.   No murmur heard. Pulmonary/Chest: Effort normal and breath sounds normal. No respiratory distress. He has no wheezes.  Abdominal: Soft. Bowel sounds are normal. He exhibits no distension and no mass. There is no tenderness.  Positive significant ventral hernia  Genitourinary: Penis normal.  Musculoskeletal: Normal range of motion. He exhibits no edema.  Lymphadenopathy:    He has no cervical adenopathy.  Neurological: He is alert. He exhibits normal muscle tone.  Skin: Skin is warm and dry. No erythema.  Psychiatric: He has a normal mood and affect. His behavior is normal. Judgment normal.  Vitals reviewed.         Assessment & Plan:  Impression 1 wellness exam. Up-to-date on pneumonia shots. Flu shot discussed. Shingles vaccine given to explore shingler ricks coverage. Next  #2 type 2 diabetes good control discussed  #3 hypertension good control discussed compliance discussed maintain same meds  #4 hyperlipidemia status uncertain to checked today  #5 general frailty and generalized weakness will try to initiate physical therapy

## 2017-05-05 DIAGNOSIS — Z13220 Encounter for screening for lipoid disorders: Secondary | ICD-10-CM | POA: Diagnosis not present

## 2017-05-06 DIAGNOSIS — N3000 Acute cystitis without hematuria: Secondary | ICD-10-CM | POA: Diagnosis not present

## 2017-05-06 DIAGNOSIS — J449 Chronic obstructive pulmonary disease, unspecified: Secondary | ICD-10-CM | POA: Diagnosis not present

## 2017-05-06 LAB — LIPID PANEL
CHOLESTEROL TOTAL: 133 mg/dL (ref 100–199)
Chol/HDL Ratio: 4.8 ratio (ref 0.0–5.0)
HDL: 28 mg/dL — AB (ref >39)
LDL CALC: 68 mg/dL (ref 0–99)
TRIGLYCERIDES: 184 mg/dL — AB (ref 0–149)
VLDL CHOLESTEROL CAL: 37 mg/dL (ref 5–40)

## 2017-05-10 ENCOUNTER — Encounter: Payer: Self-pay | Admitting: Family Medicine

## 2017-05-11 DIAGNOSIS — G4733 Obstructive sleep apnea (adult) (pediatric): Secondary | ICD-10-CM | POA: Diagnosis not present

## 2017-05-13 DIAGNOSIS — C689 Malignant neoplasm of urinary organ, unspecified: Secondary | ICD-10-CM | POA: Diagnosis not present

## 2017-05-13 DIAGNOSIS — N289 Disorder of kidney and ureter, unspecified: Secondary | ICD-10-CM | POA: Diagnosis not present

## 2017-05-13 DIAGNOSIS — L299 Pruritus, unspecified: Secondary | ICD-10-CM | POA: Diagnosis not present

## 2017-05-13 DIAGNOSIS — Z79899 Other long term (current) drug therapy: Secondary | ICD-10-CM | POA: Diagnosis not present

## 2017-05-13 DIAGNOSIS — C661 Malignant neoplasm of right ureter: Secondary | ICD-10-CM | POA: Diagnosis not present

## 2017-05-18 DIAGNOSIS — N281 Cyst of kidney, acquired: Secondary | ICD-10-CM | POA: Diagnosis not present

## 2017-05-18 DIAGNOSIS — C661 Malignant neoplasm of right ureter: Secondary | ICD-10-CM | POA: Diagnosis not present

## 2017-05-18 DIAGNOSIS — N184 Chronic kidney disease, stage 4 (severe): Secondary | ICD-10-CM | POA: Diagnosis not present

## 2017-05-18 DIAGNOSIS — Z8551 Personal history of malignant neoplasm of bladder: Secondary | ICD-10-CM | POA: Diagnosis not present

## 2017-05-18 DIAGNOSIS — N200 Calculus of kidney: Secondary | ICD-10-CM | POA: Diagnosis not present

## 2017-05-21 ENCOUNTER — Encounter: Payer: Self-pay | Admitting: Licensed Clinical Social Worker

## 2017-05-21 ENCOUNTER — Other Ambulatory Visit: Payer: Self-pay | Admitting: Licensed Clinical Social Worker

## 2017-05-21 ENCOUNTER — Ambulatory Visit (HOSPITAL_COMMUNITY): Payer: Medicare Other | Attending: Family Medicine

## 2017-05-21 DIAGNOSIS — M6281 Muscle weakness (generalized): Secondary | ICD-10-CM | POA: Diagnosis not present

## 2017-05-21 DIAGNOSIS — R262 Difficulty in walking, not elsewhere classified: Secondary | ICD-10-CM | POA: Diagnosis not present

## 2017-05-21 DIAGNOSIS — R2689 Other abnormalities of gait and mobility: Secondary | ICD-10-CM | POA: Insufficient documentation

## 2017-05-21 DIAGNOSIS — R293 Abnormal posture: Secondary | ICD-10-CM | POA: Diagnosis not present

## 2017-05-21 DIAGNOSIS — R296 Repeated falls: Secondary | ICD-10-CM | POA: Diagnosis not present

## 2017-05-21 NOTE — Patient Outreach (Signed)
Assessment:  CSW spoke via phone with [name] on 05/21/17. CSW verified identity of Stpehen Petitjean, [name] and CSW spoke of client needs. [name] said that client had his prescribed medications and is taking medications as prescribed. Client sees Dr. Baltazar Apo as his primary care doctor. Client has support from his spouse, Dequan Kindred. [name] transports client to and from client's scheduled medical appointments. Client is eating adequately and sleeping adequately. Client fatigues occasionally and has to take periodic rest breaks. Client uses a walker or a cane as needed to help him to ambulate. Client had a nephrostomy tube placed in April of 2018. Client goes to [facility] as scheduled for nephrostomy tube care. Client has not mentioned any pain issues. CSW informed Jaylene Schrom on 05/21/17 that client had now met the care plan goals for client with [facility] CSW services. Thus, CSW informed Kimi Kroft on 05/21/17 that [facility] would discharge client from [facility] on 05/21/17 since care plan goals for client had been met. [name] agreed to this plan.  She said she would also inform client on 05/21/17 that since client had met his care plan goals, that client would be discharged on 05/21/17 from [facility] services.  CSW expressed congratulations to client and [name] for client's reaching care plan goals for client. [name] said she was appreciative of [facility] support in recent months and said she was appreciative of support of CSW with client in recent months. CSW encouraged that client continue to attend all scheduled client medical appointments.  CSW thanked [place] for phone call with CSW on 05/21/17.   Plan:  CSW is discharging Chester. Profeta from [facility] services on 05/21/17 since client has met client's care plan goals with [facility] CSW services.   CSW to inform Alycia Rossetti, Case Management Assistant, that [facility] discharged client on 05/21/17 from [facility] services.  CSW to fax physician case closure letter  to Dr. Baltazar Apo informing Dr. Wolfgang Phoenix that [facility] discharged client on 05/21/17 from [facility] services.    [name].Rahmon Heigl MSW, LCSW Licensed Clinical Social Worker [facility] Care Management [phone]

## 2017-05-21 NOTE — Therapy (Signed)
[place], [place], [address] Phone: [phone]   Fax:  [phone]  Physical Therapy Evaluation  Patient Details  Name: Kyle [name] MRN: [id number] Date of Birth: 24-Nov-1933 Referring Provider: Nicoletta Ba   Encounter Date: 05/21/2017      PT End of Session - 05/21/17 1852    Visit Number 1   Number of Visits 16   Date for PT Re-Evaluation 06/20/17   Authorization Type UHC Medicare    Authorization Time Period 05/21/17-07/20/17   Authorization - Visit Number 1   Authorization - Number of Visits 10   PT Start Time 7124   PT Stop Time 5809   PT Time Calculation (min) 43 min   Activity Tolerance Patient tolerated treatment well;No increased pain;Patient limited by fatigue   Behavior During Therapy [facility] for tasks assessed/performed      Past Medical History:  Diagnosis Date  . AAA (abdominal aortic aneurysm) ([place]) 2004   s/p repair 2004; 4.3 cm infrarenal in 05/2011  . Abnormality of gait 02/23/2013  . Adenocarcinoma of right lung ([place]) 02/24/2011   Ct A/P 2012:  2cm lung mass RLL PET 9833:  Hypermetabolic RLL mass, no other hypermetabolic areas. TTNA 02/2011:  Adenocarcinoma, markers c/w lung origin Right lower lobe superior segmentectomy. 04/01/2011 Dr. Arlyce Dice   . Adenocarcinoma, lung ([place]) 01/2011   transthoracic FNA; resection of the superior segment of the RLL in 03/2011; negative nodes; no chemotherapy nor radiation planned  . Arm fracture    right arm  . Arteriosclerotic cardiovascular disease (ASCVD) 1973, 12/2010   S/P NSTEMI secondary to distal RCA/PL lesion, tx medically.  EF of  55%-60% per  echo.  . Benign prostatic hypertrophy    s/p transurethral resection of the prostate  . Bilateral renal masses    Cystic, more prominent on CT in 12/2010 than 2007; followed by Dr. Rosana Hoes  . Bladder cancer [facility]) 1996   Transurethral resection of the bladder + chemotherapy/BCG as premed  . Cataract   . Chronic  kidney disease    Creatinine 1.4 on discharge 12/20/2100; proteinuria; normal renal ultrasound in 2010; recent creatinines of 1.7-2.; Bilateral cystic renal masses by CT in 2011  . COPD (chronic obstructive pulmonary disease) ([place])   . Coronary artery disease   . Cough    thick phlegm  . Diabetes mellitus    Type II  . Diplopia 02/23/2013  . Diverticulosis   . Essential and other specified forms of tremor 02/23/2013  . Hx of Clostridium difficile infection   . Hyperlipidemia   . Hypertension   . Insomnia   . ITP (idiopathic thrombocytopenic purpura) 09/07/2012   Chronic ITP of adults versus medication-induced ITP.  Stable  . Myocardial infarction ([place])   . Nephrolithiasis 2012   ARF in 01/2011 due to obstructing nephrolithiasis  . Obesity   . OSA (obstructive sleep apnea)   . Polyneuropathy in diabetes(357.2) 02/23/2013  . Thrombocytopenia ([place])   . Tobacco abuse    50-pack-year consumption; quit in 12/2010  . Tubular adenoma of colon   . Ventral hernia     Past Surgical History:  Procedure Laterality Date  . ABDOMINAL AORTIC ANEURYSM REPAIR  2004  . CARDIAC CATHETERIZATION    . COLONOSCOPY  03/19/2010   Dr. Gala Romney -(poor prep) Anal papilla, rectal hyperplastic polyp, tubular adenoma removed splenic flexure, left-sided diverticula  . COLONOSCOPY  11/28/2004   RMR:  Diminutive rectal and left colon polyps as described above, cold  biopsied/removed/  Left sided diverticula. The remainder of the colonic mucosa appeared normal.  . COLONOSCOPY   09/15/01   RMR: Multiple diminutive polyps destroyed with dermolysis as described above/ Multiple small polyps on stalks in the colon resected with snare cautery/ Scattered pan colonic diverticulum/ The remainder of the colonic mucosa appeared normal  . COLONOSCOPY N/A 05/01/2015   Procedure: COLONOSCOPY;  Surgeon: Daneil Dolin, MD;  [facility];  Service: Endoscopy;  Laterality: N/A;  1115  . CYSTECTOMY    . CYSTOSCOPY  04/2014  .  CYSTOSTOMY W/ BLADDER BIOPSY    . FLEXIBLE SIGMOIDOSCOPY  2014   Dr. Olevia Perches: tubular adenoma, negative stool studies   . LUNG LOBECTOMY    . TONSILLECTOMY    . TRANSURETHRAL RESECTION OF PROSTATE    . VIDEO BRONCHOSCOPY WITH ENDOBRONCHIAL NAVIGATION N/A 10/10/2013   Procedure: VIDEO BRONCHOSCOPY WITH ENDOBRONCHIAL NAVIGATION;  Surgeon: Melrose Nakayama, MD;  Location: [place];  Service: Thoracic;  Laterality: N/A;  NO BLOOD THINNERS BUT PATIENT HAS ITP  . WEDGE RESECTION  04/2011   carcinoma of lung    There were no vitals filed for this visit.       Subjective Assessment - 05/21/17 1306    Subjective Pt reports he has trouble walking long distances. He reports that when he walks or tried to stand for long periods, his back starts to hurt. He says it started about a year ago. He has not been falling since he finished PT last time 12-22mo.    Pertinent History Right flank drainage tube (bladder to resevoir)    How long can you stand comfortably? not limited    How long can you walk comfortably? short community distances 300-551ft.   Diagnostic tests none    Currently in Pain? No/denies            [facility] PT Assessment - 05/21/17 0001      Assessment   Medical Diagnosis Low back pain with walking and standing    Referring Provider Willam Luking    Onset Date/Surgical Date --  about 1 year ago    Hand Dominance Right   Next MD Visit none scheduled    Prior Therapy at thsi facility 12 1 year ago for falls     Precautions   Precautions Other (comment)  bladder drain on Rt flank; under going Tx for urethral CA.      Balance Screen   Has the patient fallen in the past 6 months No   Has the patient had a decrease in activity level because of a fear of falling?  No   Is the patient reluctant to leave their home because of a fear of falling?  No     Prior Function   Level of Independence Independent   Vocation Retired     Charity fundraiser Status Within  Functional Limits for tasks assessed     Observation/Other Assessments   Focus on Therapeutic Outcomes (FOTO)  51 (49% limited)      Sensation   Light Touch Appears Intact     Transfers   Five time sit to stand comments  33   Comments near maximal effort required      Ambulation/Gait   Ambulation Distance (Feet) 225 Feet   Assistive device Straight cane   Gait velocity 0.62m/s   Gait Comments foot scuffing bilat at end of lap   audible SOB/DOE as per baseline.  Objective measurements completed on examination: See above findings.          [facility] Adult PT Treatment/Exercise - 05/21/17 0001      Exercises   Exercises Lumbar     Lumbar Exercises: Standing   Other Standing Lumbar Exercises Sit to stand: 1x10 from 24' height surface hands free     Lumbar Exercises: Seated   Hip Flexion on Ball Both;20 reps  alternating, not on ball   Other Seated Lumbar Exercises seated ankle DF: 1x15 (HEP)   Other Seated Lumbar Exercises seated ankle PF: 1x15 (HEP)                  PT Short Term Goals - 05/21/17 1901      PT SHORT TERM GOAL #1   Title Pt will demo consistency and independence with his HEP to improve pain and tolerance to household distance AMB.    Status New     PT SHORT TERM GOAL #2   Title After 4 weeks patient will demonstrate ability to AMB >245ft s foot scuffing, evidence of improved foot clearnance bilat.    Status New     PT SHORT TERM GOAL #3   Title After 4 weeks patient will demonstrate improved functional strenght AEB ability to perform 5xSTS hands free in <20sec from a surface of 22-inches or lower.     Status New           PT Long Term Goals - 05/21/17 1905      PT LONG TERM GOAL #1   Title After 8 weeks pt will demonstrate improved power output AEB 5x sit to stand (hands free) in less than 18s from 19" surface.    Status New     PT LONG TERM GOAL #2   Title After 8 weeks patient will demonstrate 4/5 in all hip and  knee muscle groups AEB MMT.      PT LONG TERM GOAL #3   Title After 8 weeks patient will demonstrate ability to perform 1023feet AMB at >0.50m/s with <2/10 low back pain to improve ability to access the community.    Status New                Plan - 05/21/17 1854    Clinical Impression Statement Pt presenting with gross motor weakness in BLE with gross motor control deficits most notably during AMB. AMB is limited to less than limited community distances prior to onset of pain in back which then rapidly becomes more severe until patient returns to sitting. Please see full detail in this note.    History and Personal Factors relevant to plan of care: PT at this site 12MA to improve balance and gait instability with good overall results; AMB has ben limited by pain. Currently undergoing CA treatment and with baldder drainage tube at left flank.    Clinical Presentation Stable   Clinical Presentation due to: no pain while at rest. AMB has been limited to this degree for several months without worsening.    Clinical Decision Making Moderate   Rehab Potential Fair   PT Frequency 2x / week   PT Duration 8 weeks   PT Treatment/Interventions ADLs/Self Care Home Management;Moist Heat;Gait training;Stair training;Functional mobility training;Therapeutic activities;Therapeutic exercise;Patient/family education;Balance training;Passive range of motion   PT Next Visit Plan Review HEP and Treatment Goals; continue to improve strength in BLE and hips, lumbar spine flexion mobility, and trunk flexion strength.    PT Home Exercise Plan *see attached  Consulted and Agree with Plan of Care Patient      Patient will benefit from skilled therapeutic intervention in order to improve the following deficits and impairments:  Abnormal gait, Pain, Improper body mechanics, Postural dysfunction, Decreased mobility, Decreased coordination, Decreased endurance, Decreased activity tolerance, Decreased strength,  Difficulty walking, Hypomobility  Visit Diagnosis: Difficulty in walking, not elsewhere classified - Plan: PT plan of care cert/re-cert  Muscle weakness (generalized) - Plan: PT plan of care cert/re-cert      G-Codes - 05/69/79 1908    Functional Assessment Tool Used (Outpatient Only) FOTO/CLinical Judgment    Functional Limitation Mobility: Walking and moving around   Mobility: Walking and Moving Around Current Status [phone]) At least 60 percent but less than 80 percent impaired, limited or restricted   Mobility: Walking and Moving Around Goal Status [phone]) At least 40 percent but less than 60 percent impaired, limited or restricted       Problem List Patient Active Problem List   Diagnosis Date Noted  . Stage 4 chronic kidney disease ([place]) 06/30/2016  . Subdural hematoma, post-traumatic ([place]) 03/21/2016  . Subdural hematoma ([place]) 03/21/2016  . Malignant neoplasm of trigone of urinary bladder ([place]) 02/12/2016  . Bilirubinemia 12/12/2015  . Thrombocytopenia ([place]) 11/13/2015  . Cough 10/03/2015  . Allergic rhinitis 10/03/2015  . Secondary hyperparathyroidism ([place]) 09/06/2015  . Tremor, essential 08/27/2015  . History of colonic polyps   . Diverticulosis of colon without hemorrhage   . Malignant neoplasm of lateral wall of urinary bladder ([place]) 01/12/2014  . CIS (carcinoma in situ of bladder) 08/22/2013  . Static tremor 02/23/2013  . Diplopia 02/23/2013  . Diabetic polyneuropathy ([place]) 02/23/2013  . Abnormality of gait 02/23/2013  . COPD exacerbation ([place]) 01/31/2013  . Fracture of right distal radius 12/14/2012  . Diabetes mellitus ([place]) 11/30/2012  . Idiopathic thrombocytopenic purpura ([place]) 09/07/2012  . Depression 07/28/2012  . Benign prostate hyperplasia 06/10/2012  . LLQ pain 11/13/2011  . Hernia of anterior abdominal wall 10/28/2011  . AAA (abdominal aortic aneurysm) ([place])   . Tobacco abuse   . Diarrhea 10/22/2011  . Tubular adenoma of colon   . Bladder neck  obstruction 06/05/2011  . Obstructive sleep apnea on CPAP 03/27/2011  . Fasting hyperglycemia 03/27/2011  . Recurrent nephrolithiasis 03/27/2011  . Carcinoma of bladder ([place]) 03/27/2011  . COPD (chronic obstructive pulmonary disease) ([place]) 02/24/2011  . Adenocarcinoma of right lung ([place]) 02/24/2011  . CAD (coronary artery disease) 01/06/2011  . Hypertension 01/06/2011  . Chronic kidney disease, stage III (moderate) ([place]) 01/06/2011   7:13 PM, 05/21/17 Etta Grandchild, PT, DPT Physical Therapist at [facility] [phone] (office)      Etta Grandchild 05/21/2017, 7:12 PM  [place] [address] [place], [place], [address] Phone: [phone]   Fax:  [phone]  Name: LUCCIANO VITALI MRN: [id number] Date of Birth: 1933/09/25

## 2017-05-22 DIAGNOSIS — N189 Chronic kidney disease, unspecified: Secondary | ICD-10-CM | POA: Diagnosis not present

## 2017-05-25 DIAGNOSIS — R399 Unspecified symptoms and signs involving the genitourinary system: Secondary | ICD-10-CM | POA: Diagnosis not present

## 2017-05-26 ENCOUNTER — Ambulatory Visit (HOSPITAL_COMMUNITY): Payer: Medicare Other | Admitting: Physical Therapy

## 2017-05-26 DIAGNOSIS — R2689 Other abnormalities of gait and mobility: Secondary | ICD-10-CM

## 2017-05-26 DIAGNOSIS — R262 Difficulty in walking, not elsewhere classified: Secondary | ICD-10-CM | POA: Diagnosis not present

## 2017-05-26 DIAGNOSIS — M6281 Muscle weakness (generalized): Secondary | ICD-10-CM

## 2017-05-26 DIAGNOSIS — R296 Repeated falls: Secondary | ICD-10-CM | POA: Diagnosis not present

## 2017-05-26 DIAGNOSIS — R293 Abnormal posture: Secondary | ICD-10-CM

## 2017-05-26 NOTE — Therapy (Signed)
[place], [place], [address] Phone: [phone]   Fax:  [phone]  Physical Therapy Treatment  Patient Details  Name: Kyle [name] MRN: [id number] Date of Birth: 1934-01-25 Referring Provider: Nicoletta Ba    Encounter Date: 05/26/2017  PT End of Session - 05/26/17 1322    Visit Number  2    Number of Visits  16    Date for PT Re-Evaluation  06/20/17    Authorization Type  UHC Medicare     Authorization Time Period  05/21/17-07/20/17    Authorization - Visit Number  2    Authorization - Number of Visits  10    PT Start Time  2585    PT Stop Time  1345    PT Time Calculation (min)  42 min    Activity Tolerance  Patient tolerated treatment well;No increased pain;Patient limited by fatigue    Behavior During Therapy  [facility] for tasks assessed/performed       Past Medical History:  Diagnosis Date  . AAA (abdominal aortic aneurysm) ([place]) 2004   s/p repair 2004; 4.3 cm infrarenal in 05/2011  . Abnormality of gait 02/23/2013  . Adenocarcinoma of right lung ([place]) 02/24/2011   Ct A/P 2012:  2cm lung mass RLL PET 2778:  Hypermetabolic RLL mass, no other hypermetabolic areas. TTNA 02/2011:  Adenocarcinoma, markers c/w lung origin Right lower lobe superior segmentectomy. 04/01/2011 Dr. Arlyce Dice   . Adenocarcinoma, lung ([place]) 01/2011   transthoracic FNA; resection of the superior segment of the RLL in 03/2011; negative nodes; no chemotherapy nor radiation planned  . Arm fracture    right arm  . Arteriosclerotic cardiovascular disease (ASCVD) 1973, 12/2010   S/P NSTEMI secondary to distal RCA/PL lesion, tx medically.  EF of  55%-60% per  echo.  . Benign prostatic hypertrophy    s/p transurethral resection of the prostate  . Bilateral renal masses    Cystic, more prominent on CT in 12/2010 than 2007; followed by Dr. Rosana Hoes  . Bladder cancer [facility]) 1996   Transurethral resection of the bladder + chemotherapy/BCG as premed  .  Cataract   . Chronic kidney disease    Creatinine 1.4 on discharge 12/20/2100; proteinuria; normal renal ultrasound in 2010; recent creatinines of 1.7-2.; Bilateral cystic renal masses by CT in 2011  . COPD (chronic obstructive pulmonary disease) ([place])   . Coronary artery disease   . Cough    thick phlegm  . Diabetes mellitus    Type II  . Diplopia 02/23/2013  . Diverticulosis   . Essential and other specified forms of tremor 02/23/2013  . Hx of Clostridium difficile infection   . Hyperlipidemia   . Hypertension   . Insomnia   . ITP (idiopathic thrombocytopenic purpura) 09/07/2012   Chronic ITP of adults versus medication-induced ITP.  Stable  . Myocardial infarction ([place])   . Nephrolithiasis 2012   ARF in 01/2011 due to obstructing nephrolithiasis  . Obesity   . OSA (obstructive sleep apnea)   . Polyneuropathy in diabetes(357.2) 02/23/2013  . Thrombocytopenia ([place])   . Tobacco abuse    50-pack-year consumption; quit in 12/2010  . Tubular adenoma of colon   . Ventral hernia     Past Surgical History:  Procedure Laterality Date  . ABDOMINAL AORTIC ANEURYSM REPAIR  2004  . CARDIAC CATHETERIZATION    . COLONOSCOPY  03/19/2010   Dr. Gala Romney -(poor prep) Anal papilla, rectal hyperplastic polyp, tubular adenoma removed splenic flexure, left-sided  diverticula  . COLONOSCOPY  11/28/2004   RMR:  Diminutive rectal and left colon polyps as described above, cold  biopsied/removed/  Left sided diverticula. The remainder of the colonic mucosa appeared normal.  . COLONOSCOPY   09/15/01   RMR: Multiple diminutive polyps destroyed with dermolysis as described above/ Multiple small polyps on stalks in the colon resected with snare cautery/ Scattered pan colonic diverticulum/ The remainder of the colonic mucosa appeared normal  . CYSTECTOMY    . CYSTOSCOPY  04/2014  . CYSTOSTOMY W/ BLADDER BIOPSY    . FLEXIBLE SIGMOIDOSCOPY  2014   Dr. Olevia Perches: tubular adenoma, negative stool studies   . LUNG LOBECTOMY     . TONSILLECTOMY    . TRANSURETHRAL RESECTION OF PROSTATE    . WEDGE RESECTION  04/2011   carcinoma of lung    There were no vitals filed for this visit.  Subjective Assessment - 05/26/17 1310    Subjective  Pt states he is having issues with his kidneys.  States he has blood in his urine and is waiting on the MD to contact him.  States he is not hurting anywhere but can feel the tube and its kinda sore.    Currently in Pain?  No/denies                      [facility] Adult PT Treatment/Exercise - 05/26/17 0001      Ambulation/Gait   Pre-Gait Activities  ambulation with SPC 200 feet heel/toe and larger steps cues      Lumbar Exercises: Standing   Other Standing Lumbar Exercises  Sit to stand: 2x5 from 24' height surface hands free    Other Standing Lumbar Exercises  3D hip excursions 10 reps each      Lumbar Exercises: Seated   Hip Flexion on Ball  Both;20 reps;Limitations    Hip Flexion on Ball Limitations  on solid surface    Other Seated Lumbar Exercises  seated ankle DF: 1x15 (HEP review)    Other Seated Lumbar Exercises  seated ankle PF: 1x15 (HEP review)             PT Education - 05/26/17 1426    Education provided  Yes    Education Details  importance of taking larger steps/picking up feet wtih ambulation.  Reviewed HEP and goals per intial evaluation and given a copy.     Person(s) Educated  Patient    Methods  Demonstration;Tactile cues;Verbal cues;Handout    Comprehension  Verbalized understanding;Returned demonstration;Verbal cues required;Tactile cues required       PT Short Term Goals - 05/21/17 1901      PT SHORT TERM GOAL #1   Title  Pt will demo consistency and independence with his HEP to improve pain and tolerance to household distance AMB.     Status  New      PT SHORT TERM GOAL #2   Title  After 4 weeks patient will demonstrate ability to AMB >211ft s foot scuffing, evidence of improved foot clearnance bilat.     Status  New       PT SHORT TERM GOAL #3   Title  After 4 weeks patient will demonstrate improved functional strenght AEB ability to perform 5xSTS hands free in <20sec from a surface of 22-inches or lower.      Status  New        PT Long Term Goals - 05/21/17 1905      PT LONG TERM GOAL #  1   Title  After 8 weeks pt will demonstrate improved power output AEB 5x sit to stand (hands free) in less than 18s from 19" surface.     Status  New      PT LONG TERM GOAL #2   Title  After 8 weeks patient will demonstrate 4/5 in all hip and knee muscle groups AEB MMT.       PT LONG TERM GOAL #3   Title  After 8 weeks patient will demonstrate ability to perform 105feet AMB at >0.40m/s with <2/10 low back pain to improve ability to access the community.     Status  New            Plan - 05/26/17 1428    Clinical Impression Statement  Spent large amount of time on education.  Importance of sticking to HEP and general safety guidelines.  also recommended pateint contact his MD if has not heard from them regarding the blood in his urine.  Reviewed goals wtih pateint and worked on [employer] using larger and higher steppage. Added 3D excursion to help encourage improved hip mobility.      Rehab Potential  Fair    PT Frequency  2x / week    PT Duration  8 weeks    PT Treatment/Interventions  ADLs/Self Care Home Management;Moist Heat;Gait training;Stair training;Functional mobility training;Therapeutic activities;Therapeutic exercise;Patient/family education;Balance training;Passive range of motion    PT Next Visit Plan  Continue to improve strength in BLE and hips, lumbar spine flexion mobility, and trunk flexion strength.   F/U on kidney issues.      PT Home Exercise Plan  *see attached    Consulted and Agree with Plan of Care  Patient       Patient will benefit from skilled therapeutic intervention in order to improve the following deficits and impairments:  Abnormal gait, Pain, Improper body mechanics, Postural  dysfunction, Decreased mobility, Decreased coordination, Decreased endurance, Decreased activity tolerance, Decreased strength, Difficulty walking, Hypomobility  Visit Diagnosis: Difficulty in walking, not elsewhere classified  Muscle weakness (generalized)  Other abnormalities of gait and mobility  Abnormal posture  Repeated falls     Problem List Patient Active Problem List   Diagnosis Date Noted  . Stage 4 chronic kidney disease ([place]) 06/30/2016  . Subdural hematoma, post-traumatic ([place]) 03/21/2016  . Subdural hematoma ([place]) 03/21/2016  . Malignant neoplasm of trigone of urinary bladder ([place]) 02/12/2016  . Bilirubinemia 12/12/2015  . Thrombocytopenia ([place]) 11/13/2015  . Cough 10/03/2015  . Allergic rhinitis 10/03/2015  . Secondary hyperparathyroidism ([place]) 09/06/2015  . Tremor, essential 08/27/2015  . History of colonic polyps   . Diverticulosis of colon without hemorrhage   . Malignant neoplasm of lateral wall of urinary bladder ([place]) 01/12/2014  . CIS (carcinoma in situ of bladder) 08/22/2013  . Static tremor 02/23/2013  . Diplopia 02/23/2013  . Diabetic polyneuropathy ([place]) 02/23/2013  . Abnormality of gait 02/23/2013  . COPD exacerbation ([place]) 01/31/2013  . Fracture of right distal radius 12/14/2012  . Diabetes mellitus ([place]) 11/30/2012  . Idiopathic thrombocytopenic purpura ([place]) 09/07/2012  . Depression 07/28/2012  . Benign prostate hyperplasia 06/10/2012  . LLQ pain 11/13/2011  . Hernia of anterior abdominal wall 10/28/2011  . AAA (abdominal aortic aneurysm) ([place])   . Tobacco abuse   . Diarrhea 10/22/2011  . Tubular adenoma of colon   . Bladder neck obstruction 06/05/2011  . Obstructive sleep apnea on CPAP 03/27/2011  . Fasting hyperglycemia 03/27/2011  . Recurrent nephrolithiasis 03/27/2011  .  Carcinoma of bladder ([place]) 03/27/2011  . COPD (chronic obstructive pulmonary disease) ([place]) 02/24/2011  . Adenocarcinoma of right lung ([place]) 02/24/2011  . CAD  (coronary artery disease) 01/06/2011  . Hypertension 01/06/2011  . Chronic kidney disease, stage III (moderate) ([place]) 01/06/2011   Teena Irani, PTA/CLT [phone]  Teena Irani 05/26/2017, 2:34 PM  [facility] [address] [place], [place], [address] Phone: [phone]   Fax:  [phone]  Name: Kyle [name] MRN: [id number] Date of Birth: 10/19/1933

## 2017-05-28 ENCOUNTER — Ambulatory Visit (HOSPITAL_COMMUNITY): Payer: Medicare Other | Admitting: Physical Therapy

## 2017-05-28 DIAGNOSIS — R2689 Other abnormalities of gait and mobility: Secondary | ICD-10-CM | POA: Diagnosis not present

## 2017-05-28 DIAGNOSIS — M6281 Muscle weakness (generalized): Secondary | ICD-10-CM | POA: Diagnosis not present

## 2017-05-28 DIAGNOSIS — R293 Abnormal posture: Secondary | ICD-10-CM | POA: Diagnosis not present

## 2017-05-28 DIAGNOSIS — R296 Repeated falls: Secondary | ICD-10-CM

## 2017-05-28 DIAGNOSIS — R262 Difficulty in walking, not elsewhere classified: Secondary | ICD-10-CM

## 2017-05-28 NOTE — Therapy (Signed)
[place], [place], [address] Phone: [phone]   Fax:  [phone]  Physical Therapy Treatment  Patient Details  Name: Kyle [name] MRN: [id number] Date of Birth: [birth year] Referring Provider: Nicoletta Ba    Encounter Date: 05/28/2017  PT End of Session - 05/28/17 1356    Visit Number  3    Number of Visits  16    Date for PT Re-Evaluation  06/20/17    Authorization Type  UHC Medicare     Authorization Time Period  05/21/17-07/20/17    Authorization - Visit Number  3    Authorization - Number of Visits  10    PT Start Time  3875    PT Stop Time  6433    PT Time Calculation (min)  50 min    Activity Tolerance  Patient tolerated treatment well;No increased pain;Patient limited by fatigue    Behavior During Therapy  [facility] for tasks assessed/performed       Past Medical History:  Diagnosis Date  . AAA (abdominal aortic aneurysm) ([place]) 2004   s/p repair 2004; 4.3 cm infrarenal in 05/2011  . Abnormality of gait 02/23/2013  . Adenocarcinoma of right lung ([place]) 02/24/2011   Ct A/P 2012:  2cm lung mass RLL PET 2951:  Hypermetabolic RLL mass, no other hypermetabolic areas. TTNA 02/2011:  Adenocarcinoma, markers c/w lung origin Right lower lobe superior segmentectomy. 04/01/2011 Dr. Arlyce Dice   . Adenocarcinoma, lung ([place]) 01/2011   transthoracic FNA; resection of the superior segment of the RLL in 03/2011; negative nodes; no chemotherapy nor radiation planned  . Arm fracture    right arm  . Arteriosclerotic cardiovascular disease (ASCVD) 1973, 12/2010   S/P NSTEMI secondary to distal RCA/PL lesion, tx medically.  EF of  55%-60% per  echo.  . Benign prostatic hypertrophy    s/p transurethral resection of the prostate  . Bilateral renal masses    Cystic, more prominent on CT in 12/2010 than 2007; followed by Dr. Rosana Hoes  . Bladder cancer [facility]) 1996   Transurethral resection of the bladder + chemotherapy/BCG as premed  .  Cataract   . Chronic kidney disease    Creatinine 1.4 on discharge 12/20/2100; proteinuria; normal renal ultrasound in 2010; recent creatinines of 1.7-2.; Bilateral cystic renal masses by CT in 2011  . COPD (chronic obstructive pulmonary disease) ([place])   . Coronary artery disease   . Cough    thick phlegm  . Diabetes mellitus    Type II  . Diplopia 02/23/2013  . Diverticulosis   . Essential and other specified forms of tremor 02/23/2013  . Hx of Clostridium difficile infection   . Hyperlipidemia   . Hypertension   . Insomnia   . ITP (idiopathic thrombocytopenic purpura) 09/07/2012   Chronic ITP of adults versus medication-induced ITP.  Stable  . Myocardial infarction ([place])   . Nephrolithiasis 2012   ARF in 01/2011 due to obstructing nephrolithiasis  . Obesity   . OSA (obstructive sleep apnea)   . Polyneuropathy in diabetes(357.2) 02/23/2013  . Thrombocytopenia ([place])   . Tobacco abuse    50-pack-year consumption; quit in 12/2010  . Tubular adenoma of colon   . Ventral hernia     Past Surgical History:  Procedure Laterality Date  . ABDOMINAL AORTIC ANEURYSM REPAIR  2004  . CARDIAC CATHETERIZATION    . COLONOSCOPY  03/19/2010   Dr. Gala Romney -(poor prep) Anal papilla, rectal hyperplastic polyp, tubular adenoma removed splenic flexure, left-sided  diverticula  . COLONOSCOPY  11/28/2004   RMR:  Diminutive rectal and left colon polyps as described above, cold  biopsied/removed/  Left sided diverticula. The remainder of the colonic mucosa appeared normal.  . COLONOSCOPY   09/15/01   RMR: Multiple diminutive polyps destroyed with dermolysis as described above/ Multiple small polyps on stalks in the colon resected with snare cautery/ Scattered pan colonic diverticulum/ The remainder of the colonic mucosa appeared normal  . CYSTECTOMY    . CYSTOSCOPY  04/2014  . CYSTOSTOMY W/ BLADDER BIOPSY    . FLEXIBLE SIGMOIDOSCOPY  2014   Dr. Olevia Perches: tubular adenoma, negative stool studies   . LUNG LOBECTOMY     . TONSILLECTOMY    . TRANSURETHRAL RESECTION OF PROSTATE    . WEDGE RESECTION  04/2011   carcinoma of lung    There were no vitals filed for this visit.  Subjective Assessment - 05/28/17 1310    Subjective  Pt states he was instructed to take AZO for his Lt kidney infection (Rt is emptied via catheder) until he can get in to his urologist for further examination.  States he is not hurting today.   Pt comes today without AD stating he forgot his SPC at home.                      [facility] Adult PT Treatment/Exercise - 05/28/17 0001      Ambulation/Gait   Pre-Gait Activities  ambulation 100 X 2 without AD      Lumbar Exercises: Standing   Heel Raises  --    Other Standing Lumbar Exercises  Sit to stand: 2x5 from standard chair height no UE's    Other Standing Lumbar Exercises  3D hip excursions 10 reps each      Lumbar Exercises: Seated   Long Arc Quad on Chair  Both;10 reps    Hip Flexion on Ball  Both;20 reps;Limitations    Hip Flexion on Ball Limitations  on solid surface      Knee/Hip Exercises: Standing   Heel Raises  Both;10 reps    Hip Abduction  Both;10 reps    Hip Extension  Both;10 reps               PT Short Term Goals - 05/21/17 1901      PT SHORT TERM GOAL #1   Title  Pt will demo consistency and independence with his HEP to improve pain and tolerance to household distance AMB.     Status  New      PT SHORT TERM GOAL #2   Title  After 4 weeks patient will demonstrate ability to AMB >248ft s foot scuffing, evidence of improved foot clearnance bilat.     Status  New      PT SHORT TERM GOAL #3   Title  After 4 weeks patient will demonstrate improved functional strenght AEB ability to perform 5xSTS hands free in <20sec from a surface of 22-inches or lower.      Status  New        PT Long Term Goals - 05/21/17 1905      PT LONG TERM GOAL #1   Title  After 8 weeks pt will demonstrate improved power output AEB 5x sit to stand (hands free)  in less than 18s from 19" surface.     Status  New      PT LONG TERM GOAL #2   Title  After 8 weeks patient will  demonstrate 4/5 in all hip and knee muscle groups AEB MMT.       PT LONG TERM GOAL #3   Title  After 8 weeks patient will demonstrate ability to perform 1045feet AMB at >0.63m/s with <2/10 low back pain to improve ability to access the community.     Status  New            Plan - 05/28/17 1357    Clinical Impression Statement  Continued with progression of therex and increasing of functional mobility.  Pt forgot SPC today so worked on gait without AD increasing trunk mobility and UE swing.  Added standing hip strengthening exercises with verbal and tactile cues for form.      Rehab Potential  Fair    PT Frequency  2x / week    PT Duration  8 weeks    PT Treatment/Interventions  ADLs/Self Care Home Management;Moist Heat;Gait training;Stair training;Functional mobility training;Therapeutic activities;Therapeutic exercise;Patient/family education;Balance training;Passive range of motion    PT Next Visit Plan  Continue to improve strength in BLE.  Increase ambulation tolerance/independence.  Add SLS and vector stance next session.    PT Home Exercise Plan  *see attached    Consulted and Agree with Plan of Care  Patient       Patient will benefit from skilled therapeutic intervention in order to improve the following deficits and impairments:  Abnormal gait, Pain, Improper body mechanics, Postural dysfunction, Decreased mobility, Decreased coordination, Decreased endurance, Decreased activity tolerance, Decreased strength, Difficulty walking, Hypomobility  Visit Diagnosis: Difficulty in walking, not elsewhere classified  Muscle weakness (generalized)  Other abnormalities of gait and mobility  Abnormal posture  Repeated falls     Problem List Patient Active Problem List   Diagnosis Date Noted  . Stage 4 chronic kidney disease ([place]) 06/30/2016  . Subdural hematoma,  post-traumatic ([place]) 03/21/2016  . Subdural hematoma ([place]) 03/21/2016  . Malignant neoplasm of trigone of urinary bladder ([place]) 02/12/2016  . Bilirubinemia 12/12/2015  . Thrombocytopenia ([place]) 11/13/2015  . Cough 10/03/2015  . Allergic rhinitis 10/03/2015  . Secondary hyperparathyroidism ([place]) 09/06/2015  . Tremor, essential 08/27/2015  . History of colonic polyps   . Diverticulosis of colon without hemorrhage   . Malignant neoplasm of lateral wall of urinary bladder ([place]) 01/12/2014  . CIS (carcinoma in situ of bladder) 08/22/2013  . Static tremor 02/23/2013  . Diplopia 02/23/2013  . Diabetic polyneuropathy ([place]) 02/23/2013  . Abnormality of gait 02/23/2013  . COPD exacerbation ([place]) 01/31/2013  . Fracture of right distal radius 12/14/2012  . Diabetes mellitus ([place]) 11/30/2012  . Idiopathic thrombocytopenic purpura ([place]) 09/07/2012  . Depression 07/28/2012  . Benign prostate hyperplasia 06/10/2012  . LLQ pain 11/13/2011  . Hernia of anterior abdominal wall 10/28/2011  . AAA (abdominal aortic aneurysm) ([place])   . Tobacco abuse   . Diarrhea 10/22/2011  . Tubular adenoma of colon   . Bladder neck obstruction 06/05/2011  . Obstructive sleep apnea on CPAP 03/27/2011  . Fasting hyperglycemia 03/27/2011  . Recurrent nephrolithiasis 03/27/2011  . Carcinoma of bladder ([place]) 03/27/2011  . COPD (chronic obstructive pulmonary disease) ([place]) 02/24/2011  . Adenocarcinoma of right lung ([place]) 02/24/2011  . CAD (coronary artery disease) 01/06/2011  . Hypertension 01/06/2011  . Chronic kidney disease, stage III (moderate) ([place]) 01/06/2011   Teena Irani, PTA/CLT [phone]  Teena Irani 05/28/2017, 2:07 PM  [facility] [address] [place], [place], [address] Phone: [phone]   Fax:  [phone]  Name: Kyle [name] MRN: [id number] Date of Birth: January 13, 1934

## 2017-06-01 DIAGNOSIS — N189 Chronic kidney disease, unspecified: Secondary | ICD-10-CM | POA: Diagnosis not present

## 2017-06-01 DIAGNOSIS — I10 Essential (primary) hypertension: Secondary | ICD-10-CM | POA: Diagnosis not present

## 2017-06-02 ENCOUNTER — Encounter (HOSPITAL_COMMUNITY): Payer: Self-pay

## 2017-06-02 ENCOUNTER — Ambulatory Visit (HOSPITAL_COMMUNITY): Payer: Medicare Other

## 2017-06-02 DIAGNOSIS — R262 Difficulty in walking, not elsewhere classified: Secondary | ICD-10-CM | POA: Diagnosis not present

## 2017-06-02 DIAGNOSIS — R293 Abnormal posture: Secondary | ICD-10-CM | POA: Diagnosis not present

## 2017-06-02 DIAGNOSIS — R296 Repeated falls: Secondary | ICD-10-CM

## 2017-06-02 DIAGNOSIS — R2689 Other abnormalities of gait and mobility: Secondary | ICD-10-CM | POA: Diagnosis not present

## 2017-06-02 DIAGNOSIS — M6281 Muscle weakness (generalized): Secondary | ICD-10-CM | POA: Diagnosis not present

## 2017-06-02 NOTE — Therapy (Signed)
[place], [place], [address] Phone: [phone]   Fax:  [phone]  Physical Therapy Treatment  Patient Details  Name: Kyle [name] MRN: [id number] Date of Birth: [birth year] Referring Provider: Nicoletta Ba    Encounter Date: 06/02/2017  PT End of Session - 06/02/17 1206    Visit Number  4    Number of Visits  16    Date for PT Re-Evaluation  06/20/17    Authorization Type  UHC Medicare     Authorization Time Period  05/21/17-07/20/17    Authorization - Visit Number  4    Authorization - Number of Visits  10    PT Start Time  4235    PT Stop Time  1203    PT Time Calculation (min)  38 min    Activity Tolerance  Patient tolerated treatment well;No increased pain;Patient limited by fatigue    Behavior During Therapy  [facility] for tasks assessed/performed       Past Medical History:  Diagnosis Date  . AAA (abdominal aortic aneurysm) ([place]) 2004   s/p repair 2004; 4.3 cm infrarenal in 05/2011  . Abnormality of gait 02/23/2013  . Adenocarcinoma of right lung ([place]) 02/24/2011   Ct A/P 2012:  2cm lung mass RLL PET 3614:  Hypermetabolic RLL mass, no other hypermetabolic areas. TTNA 02/2011:  Adenocarcinoma, markers c/w lung origin Right lower lobe superior segmentectomy. 04/01/2011 Dr. Arlyce Dice   . Adenocarcinoma, lung ([place]) 01/2011   transthoracic FNA; resection of the superior segment of the RLL in 03/2011; negative nodes; no chemotherapy nor radiation planned  . Arm fracture    right arm  . Arteriosclerotic cardiovascular disease (ASCVD) 1973, 12/2010   S/P NSTEMI secondary to distal RCA/PL lesion, tx medically.  EF of  55%-60% per  echo.  . Benign prostatic hypertrophy    s/p transurethral resection of the prostate  . Bilateral renal masses    Cystic, more prominent on CT in 12/2010 than 2007; followed by Dr. Rosana Hoes  . Bladder cancer [facility]) 1996   Transurethral resection of the bladder + chemotherapy/BCG as premed  .  Cataract   . Chronic kidney disease    Creatinine 1.4 on discharge 12/20/2100; proteinuria; normal renal ultrasound in 2010; recent creatinines of 1.7-2.; Bilateral cystic renal masses by CT in 2011  . COPD (chronic obstructive pulmonary disease) ([place])   . Coronary artery disease   . Cough    thick phlegm  . Diabetes mellitus    Type II  . Diplopia 02/23/2013  . Diverticulosis   . Essential and other specified forms of tremor 02/23/2013  . Hx of Clostridium difficile infection   . Hyperlipidemia   . Hypertension   . Insomnia   . ITP (idiopathic thrombocytopenic purpura) 09/07/2012   Chronic ITP of adults versus medication-induced ITP.  Stable  . Myocardial infarction ([place])   . Nephrolithiasis 2012   ARF in 01/2011 due to obstructing nephrolithiasis  . Obesity   . OSA (obstructive sleep apnea)   . Polyneuropathy in diabetes(357.2) 02/23/2013  . Thrombocytopenia ([place])   . Tobacco abuse    50-pack-year consumption; quit in 12/2010  . Tubular adenoma of colon   . Ventral hernia     Past Surgical History:  Procedure Laterality Date  . ABDOMINAL AORTIC ANEURYSM REPAIR  2004  . CARDIAC CATHETERIZATION    . COLONOSCOPY  03/19/2010   Dr. Gala Romney -(poor prep) Anal papilla, rectal hyperplastic polyp, tubular adenoma removed splenic flexure, left-sided  diverticula  . COLONOSCOPY  11/28/2004   RMR:  Diminutive rectal and left colon polyps as described above, cold  biopsied/removed/  Left sided diverticula. The remainder of the colonic mucosa appeared normal.  . COLONOSCOPY   09/15/01   RMR: Multiple diminutive polyps destroyed with dermolysis as described above/ Multiple small polyps on stalks in the colon resected with snare cautery/ Scattered pan colonic diverticulum/ The remainder of the colonic mucosa appeared normal  . CYSTECTOMY    . CYSTOSCOPY  04/2014  . CYSTOSTOMY W/ BLADDER BIOPSY    . FLEXIBLE SIGMOIDOSCOPY  2014   Dr. Olevia Perches: tubular adenoma, negative stool studies   . LUNG LOBECTOMY     . TONSILLECTOMY    . TRANSURETHRAL RESECTION OF PROSTATE    . WEDGE RESECTION  04/2011   carcinoma of lung    There were no vitals filed for this visit.  Subjective Assessment - 06/02/17 1134    Subjective  Pt stated he is feeling good today, no reports of pain or recent fall.  Reports main difficulty with endurance.  Arrived to session today wiht SPC.      Pertinent History  Right flank drainage tube (bladder to resevoir)     Currently in Pain?  No/denies                      [facility] Adult PT Treatment/Exercise - 06/02/17 0001      Ambulation/Gait   Ambulation Distance (Feet)  226 Feet 2 sets    Assistive device  Straight cane      Lumbar Exercises: Standing   Other Standing Lumbar Exercises  3D hip excursions 10 reps each      Lumbar Exercises: Seated   Long Arc Quad on Chair  Both;20 reps dynadisc    Hip Flexion on Ball  Both;20 reps;Limitations    Hip Flexion on Ball Limitations  on dynadisc no back support or HHA    Sit to Stand  5 reps 2 sets x 5 reps standard height      Knee/Hip Exercises: Standing   Heel Raises  Both;10 reps Toe raises    Functional Squat  10 reps 3D hip excursion    Rocker Board  1 minute reports of soreness with knees    SLS  3x 10" with 1 HHA    SLS with Vectors  3x 5" holds with HHA BLE               PT Short Term Goals - 05/21/17 1901      PT SHORT TERM GOAL #1   Title  Pt will demo consistency and independence with his HEP to improve pain and tolerance to household distance AMB.     Status  New      PT SHORT TERM GOAL #2   Title  After 4 weeks patient will demonstrate ability to AMB >245ft s foot scuffing, evidence of improved foot clearnance bilat.     Status  New      PT SHORT TERM GOAL #3   Title  After 4 weeks patient will demonstrate improved functional strenght AEB ability to perform 5xSTS hands free in <20sec from a surface of 22-inches or lower.      Status  New        PT Long Term Goals - 05/21/17  1905      PT LONG TERM GOAL #1   Title  After 8 weeks pt will demonstrate improved power output AEB 5x sit  to stand (hands free) in less than 18s from 19" surface.     Status  New      PT LONG TERM GOAL #2   Title  After 8 weeks patient will demonstrate 4/5 in all hip and knee muscle groups AEB MMT.       PT LONG TERM GOAL #3   Title  After 8 weeks patient will demonstrate ability to perform 1059feet AMB at >0.3m/s with <2/10 low back pain to improve ability to access the community.     Status  New            Plan - 06/02/17 1458    Clinical Impression Statement  Continued sessoin focus for functional mobility and strengthening.  Gait training to improve sequence with and without AD to improve stride length, trunk mobiltiy and UE swing.  Added SLS/vector stance for balance training.  EOS pt limited by fatiuge, no reports of pain.      Rehab Potential  Fair    PT Frequency  2x / week    PT Duration  8 weeks    PT Treatment/Interventions  ADLs/Self Care Home Management;Moist Heat;Gait training;Stair training;Functional mobility training;Therapeutic activities;Therapeutic exercise;Patient/family education;Balance training;Passive range of motion    PT Next Visit Plan  Continue to improve strength in BLE.  Increase ambulation tolerance/independence.  Add sidestep next session.         Patient will benefit from skilled therapeutic intervention in order to improve the following deficits and impairments:  Abnormal gait, Pain, Improper body mechanics, Postural dysfunction, Decreased mobility, Decreased coordination, Decreased endurance, Decreased activity tolerance, Decreased strength, Difficulty walking, Hypomobility  Visit Diagnosis: Difficulty in walking, not elsewhere classified  Muscle weakness (generalized)  Other abnormalities of gait and mobility  Abnormal posture  Repeated falls     Problem List Patient Active Problem List   Diagnosis Date Noted  . Stage 4 chronic  kidney disease ([place]) 06/30/2016  . Subdural hematoma, post-traumatic ([place]) 03/21/2016  . Subdural hematoma ([place]) 03/21/2016  . Malignant neoplasm of trigone of urinary bladder ([place]) 02/12/2016  . Bilirubinemia 12/12/2015  . Thrombocytopenia ([place]) 11/13/2015  . Cough 10/03/2015  . Allergic rhinitis 10/03/2015  . Secondary hyperparathyroidism ([place]) 09/06/2015  . Tremor, essential 08/27/2015  . History of colonic polyps   . Diverticulosis of colon without hemorrhage   . Malignant neoplasm of lateral wall of urinary bladder ([place]) 01/12/2014  . CIS (carcinoma in situ of bladder) 08/22/2013  . Static tremor 02/23/2013  . Diplopia 02/23/2013  . Diabetic polyneuropathy ([place]) 02/23/2013  . Abnormality of gait 02/23/2013  . COPD exacerbation ([place]) 01/31/2013  . Fracture of right distal radius 12/14/2012  . Diabetes mellitus ([place]) 11/30/2012  . Idiopathic thrombocytopenic purpura ([place]) 09/07/2012  . Depression 07/28/2012  . Benign prostate hyperplasia 06/10/2012  . LLQ pain 11/13/2011  . Hernia of anterior abdominal wall 10/28/2011  . AAA (abdominal aortic aneurysm) ([place])   . Tobacco abuse   . Diarrhea 10/22/2011  . Tubular adenoma of colon   . Bladder neck obstruction 06/05/2011  . Obstructive sleep apnea on CPAP 03/27/2011  . Fasting hyperglycemia 03/27/2011  . Recurrent nephrolithiasis 03/27/2011  . Carcinoma of bladder ([place]) 03/27/2011  . COPD (chronic obstructive pulmonary disease) ([place]) 02/24/2011  . Adenocarcinoma of right lung ([place]) 02/24/2011  . CAD (coronary artery disease) 01/06/2011  . Hypertension 01/06/2011  . Chronic kidney disease, stage III (moderate) ([place]) 01/06/2011   Kyle [name], LPTA; [place]  Aldona Lento 06/02/2017, 3:02 PM  [place] [address] [place], [place], [address] Phone: [phone]   Fax:  [phone]  Name: Kyle [name] MRN: [id number] Date of Birth: 1933-10-08

## 2017-06-03 DIAGNOSIS — Z888 Allergy status to other drugs, medicaments and biological substances status: Secondary | ICD-10-CM | POA: Diagnosis not present

## 2017-06-03 DIAGNOSIS — Z7951 Long term (current) use of inhaled steroids: Secondary | ICD-10-CM | POA: Diagnosis not present

## 2017-06-03 DIAGNOSIS — Z7982 Long term (current) use of aspirin: Secondary | ICD-10-CM | POA: Diagnosis not present

## 2017-06-03 DIAGNOSIS — N320 Bladder-neck obstruction: Secondary | ICD-10-CM | POA: Diagnosis not present

## 2017-06-03 DIAGNOSIS — I129 Hypertensive chronic kidney disease with stage 1 through stage 4 chronic kidney disease, or unspecified chronic kidney disease: Secondary | ICD-10-CM | POA: Diagnosis not present

## 2017-06-03 DIAGNOSIS — Z9889 Other specified postprocedural states: Secondary | ICD-10-CM | POA: Diagnosis not present

## 2017-06-03 DIAGNOSIS — Z5112 Encounter for antineoplastic immunotherapy: Secondary | ICD-10-CM | POA: Diagnosis not present

## 2017-06-03 DIAGNOSIS — E7849 Other hyperlipidemia: Secondary | ICD-10-CM | POA: Diagnosis not present

## 2017-06-03 DIAGNOSIS — I2510 Atherosclerotic heart disease of native coronary artery without angina pectoris: Secondary | ICD-10-CM | POA: Diagnosis not present

## 2017-06-03 DIAGNOSIS — C676 Malignant neoplasm of ureteric orifice: Secondary | ICD-10-CM | POA: Diagnosis not present

## 2017-06-03 DIAGNOSIS — N183 Chronic kidney disease, stage 3 (moderate): Secondary | ICD-10-CM | POA: Diagnosis not present

## 2017-06-03 DIAGNOSIS — Z885 Allergy status to narcotic agent status: Secondary | ICD-10-CM | POA: Diagnosis not present

## 2017-06-03 DIAGNOSIS — E1122 Type 2 diabetes mellitus with diabetic chronic kidney disease: Secondary | ICD-10-CM | POA: Diagnosis not present

## 2017-06-03 DIAGNOSIS — Z79899 Other long term (current) drug therapy: Secondary | ICD-10-CM | POA: Diagnosis not present

## 2017-06-03 DIAGNOSIS — Z466 Encounter for fitting and adjustment of urinary device: Secondary | ICD-10-CM | POA: Diagnosis not present

## 2017-06-03 DIAGNOSIS — R319 Hematuria, unspecified: Secondary | ICD-10-CM | POA: Diagnosis not present

## 2017-06-03 DIAGNOSIS — Z936 Other artificial openings of urinary tract status: Secondary | ICD-10-CM | POA: Diagnosis not present

## 2017-06-03 DIAGNOSIS — J449 Chronic obstructive pulmonary disease, unspecified: Secondary | ICD-10-CM | POA: Diagnosis not present

## 2017-06-03 DIAGNOSIS — L299 Pruritus, unspecified: Secondary | ICD-10-CM | POA: Diagnosis not present

## 2017-06-03 DIAGNOSIS — C679 Malignant neoplasm of bladder, unspecified: Secondary | ICD-10-CM | POA: Diagnosis not present

## 2017-06-03 DIAGNOSIS — Z436 Encounter for attention to other artificial openings of urinary tract: Secondary | ICD-10-CM | POA: Diagnosis not present

## 2017-06-04 ENCOUNTER — Ambulatory Visit (HOSPITAL_COMMUNITY): Payer: Medicare Other | Admitting: Physical Therapy

## 2017-06-04 ENCOUNTER — Telehealth (HOSPITAL_COMMUNITY): Payer: Self-pay | Admitting: Family Medicine

## 2017-06-04 NOTE — Telephone Encounter (Signed)
06/04/17  he left a messge that he wouldn't be here today ... he said at his last appt he may not be here because of something he had done on Wed.

## 2017-06-06 DIAGNOSIS — J449 Chronic obstructive pulmonary disease, unspecified: Secondary | ICD-10-CM | POA: Diagnosis not present

## 2017-06-08 ENCOUNTER — Ambulatory Visit (HOSPITAL_COMMUNITY): Payer: Medicare Other | Admitting: Physical Therapy

## 2017-06-08 ENCOUNTER — Encounter (HOSPITAL_COMMUNITY): Payer: Self-pay | Admitting: Physical Therapy

## 2017-06-08 DIAGNOSIS — R296 Repeated falls: Secondary | ICD-10-CM | POA: Diagnosis not present

## 2017-06-08 DIAGNOSIS — R293 Abnormal posture: Secondary | ICD-10-CM

## 2017-06-08 DIAGNOSIS — R262 Difficulty in walking, not elsewhere classified: Secondary | ICD-10-CM

## 2017-06-08 DIAGNOSIS — R2689 Other abnormalities of gait and mobility: Secondary | ICD-10-CM

## 2017-06-08 DIAGNOSIS — M6281 Muscle weakness (generalized): Secondary | ICD-10-CM

## 2017-06-08 NOTE — Therapy (Signed)
[place], [place], [address] Phone: [phone]   Fax:  [phone]  Physical Therapy Treatment  Patient Details  Name: Kyle [name] MRN: [id number] Date of Birth: [birth year] Referring Provider: Nicoletta Ba    Encounter Date: 06/08/2017  PT End of Session - 06/08/17 1428    Visit Number  5    Number of Visits  16    Date for PT Re-Evaluation  06/20/17    Authorization Type  UHC Medicare     Authorization Time Period  05/21/17-07/20/17    Authorization - Visit Number  5    Authorization - Number of Visits  10    PT Start Time  2876    PT Stop Time  1427    PT Time Calculation (min)  42 min    Activity Tolerance  Patient tolerated treatment well;No increased pain;Patient limited by fatigue    Behavior During Therapy  [facility] for tasks assessed/performed       Past Medical History:  Diagnosis Date  . AAA (abdominal aortic aneurysm) ([place]) 2004   s/p repair 2004; 4.3 cm infrarenal in 05/2011  . Abnormality of gait 02/23/2013  . Adenocarcinoma of right lung ([place]) 02/24/2011   Ct A/P 2012:  2cm lung mass RLL PET 8115:  Hypermetabolic RLL mass, no other hypermetabolic areas. TTNA 02/2011:  Adenocarcinoma, markers c/w lung origin Right lower lobe superior segmentectomy. 04/01/2011 Dr. Arlyce Dice   . Adenocarcinoma, lung ([place]) 01/2011   transthoracic FNA; resection of the superior segment of the RLL in 03/2011; negative nodes; no chemotherapy nor radiation planned  . Arm fracture    right arm  . Arteriosclerotic cardiovascular disease (ASCVD) 1973, 12/2010   S/P NSTEMI secondary to distal RCA/PL lesion, tx medically.  EF of  55%-60% per  echo.  . Benign prostatic hypertrophy    s/p transurethral resection of the prostate  . Bilateral renal masses    Cystic, more prominent on CT in 12/2010 than 2007; followed by Dr. Rosana Hoes  . Bladder cancer [facility]) 1996   Transurethral resection of the bladder + chemotherapy/BCG as premed  .  Cataract   . Chronic kidney disease    Creatinine 1.4 on discharge 12/20/2100; proteinuria; normal renal ultrasound in 2010; recent creatinines of 1.7-2.; Bilateral cystic renal masses by CT in 2011  . COPD (chronic obstructive pulmonary disease) ([place])   . Coronary artery disease   . Cough    thick phlegm  . Diabetes mellitus    Type II  . Diplopia 02/23/2013  . Diverticulosis   . Essential and other specified forms of tremor 02/23/2013  . Hx of Clostridium difficile infection   . Hyperlipidemia   . Hypertension   . Insomnia   . ITP (idiopathic thrombocytopenic purpura) 09/07/2012   Chronic ITP of adults versus medication-induced ITP.  Stable  . Myocardial infarction ([place])   . Nephrolithiasis 2012   ARF in 01/2011 due to obstructing nephrolithiasis  . Obesity   . OSA (obstructive sleep apnea)   . Polyneuropathy in diabetes(357.2) 02/23/2013  . Thrombocytopenia ([place])   . Tobacco abuse    50-pack-year consumption; quit in 12/2010  . Tubular adenoma of colon   . Ventral hernia     Past Surgical History:  Procedure Laterality Date  . ABDOMINAL AORTIC ANEURYSM REPAIR  2004  . CARDIAC CATHETERIZATION    . COLONOSCOPY  03/19/2010   Dr. Gala Romney -(poor prep) Anal papilla, rectal hyperplastic polyp, tubular adenoma removed splenic flexure, left-sided  diverticula  . COLONOSCOPY  11/28/2004   RMR:  Diminutive rectal and left colon polyps as described above, cold  biopsied/removed/  Left sided diverticula. The remainder of the colonic mucosa appeared normal.  . COLONOSCOPY   09/15/01   RMR: Multiple diminutive polyps destroyed with dermolysis as described above/ Multiple small polyps on stalks in the colon resected with snare cautery/ Scattered pan colonic diverticulum/ The remainder of the colonic mucosa appeared normal  . COLONOSCOPY N/A 05/01/2015   Performed by Daneil Dolin, MD at [place]  . CYSTECTOMY    . CYSTOSCOPY  04/2014  . CYSTOSTOMY W/ BLADDER BIOPSY    . FLEXIBLE  SIGMOIDOSCOPY  2014   Dr. Olevia Perches: tubular adenoma, negative stool studies   . LUNG LOBECTOMY    . TONSILLECTOMY    . TRANSURETHRAL RESECTION OF PROSTATE    . VIDEO BRONCHOSCOPY WITH ENDOBRONCHIAL NAVIGATION N/A 10/10/2013   Performed by Melrose Nakayama, MD at [facility]  04/2011   carcinoma of lung    There were no vitals filed for this visit.  Subjective Assessment - 06/08/17 1348    Subjective  patient arrives stating he is feeling good today, his L knee is sore     Pertinent History  Right flank drainage tube (bladder to resevoir)     Currently in Pain?  No/denies when it acts up, like when he gets up and down, it can be painful                       [facility] Adult PT Treatment/Exercise - 06/08/17 0001      Ambulation/Gait   Pre-Gait Activities  AMB x296ft min guard no device       Lumbar Exercises: Standing   Heel Raises  15 reps heel and teo     Forward Lunge  15 reps;10 reps 4 inch box (10 on L due to l knee soreness)    Other Standing Lumbar Exercises  step ups forwards and lateral step ups x10 B     Other Standing Lumbar Exercises  side stepping 2x87ft no resistance, mod cues for form  HR 118/O2 88 following       Lumbar Exercises: Seated   Long Arc Quad on Chair  Both;1 set;10 reps 5#     Hip Flexion on Ball  Both;10 reps 5# B              PT Education - 06/08/17 1427    Education provided  Yes    Education Details  vital response and importance of this during session, pursed lip breathing     Person(s) Educated  Patient    Methods  Explanation    Comprehension  Verbalized understanding       PT Short Term Goals - 05/21/17 1901      PT SHORT TERM GOAL #1   Title  Pt will demo consistency and independence with his HEP to improve pain and tolerance to household distance AMB.     Status  New      PT SHORT TERM GOAL #2   Title  After 4 weeks patient will demonstrate ability to AMB >275ft s foot scuffing, evidence of  improved foot clearnance bilat.     Status  New      PT SHORT TERM GOAL #3   Title  After 4 weeks patient will demonstrate improved functional strenght AEB ability to perform 5xSTS hands free in <20sec from  a surface of 22-inches or lower.      Status  New        PT Long Term Goals - 05/21/17 1905      PT LONG TERM GOAL #1   Title  After 8 weeks pt will demonstrate improved power output AEB 5x sit to stand (hands free) in less than 18s from 19" surface.     Status  New      PT LONG TERM GOAL #2   Title  After 8 weeks patient will demonstrate 4/5 in all hip and knee muscle groups AEB MMT.       PT LONG TERM GOAL #3   Title  After 8 weeks patient will demonstrate ability to perform 1043feet AMB at >0.55m/s with <2/10 low back pain to improve ability to access the community.     Status  New            Plan - 06/08/17 1428    Clinical Impression Statement  Patient arrives today with no major complaints, he reports that strength is his biggest concern right now. Focused on functional strength and endurance per POC today, with cues provided as needed throughout session. Patient very pleasant and  motivated to participate with PT today. Did monitor vitals during session due to SOB with exercise, O2/HR monitored throughout session and corrected with rest breaks with cues for pursed lip breathing- note HR going as high as 118 from base of 76 and O2 dropping to 85% room air with activity, both recovering with seated rest/pursed lip breathing. Patient limited by fatigue this session.     Rehab Potential  Fair    PT Frequency  2x / week    PT Duration  8 weeks    PT Treatment/Interventions  ADLs/Self Care Home Management;Moist Heat;Gait training;Stair training;Functional mobility training;Therapeutic activities;Therapeutic exercise;Patient/family education;Balance training;Passive range of motion    PT Next Visit Plan  Continue to improve strength in BLE.  Increase ambulation  tolerance/independence.  Watch vitals due to deconditioning.     Consulted and Agree with Plan of Care  Patient       Patient will benefit from skilled therapeutic intervention in order to improve the following deficits and impairments:  Abnormal gait, Pain, Improper body mechanics, Postural dysfunction, Decreased mobility, Decreased coordination, Decreased endurance, Decreased activity tolerance, Decreased strength, Difficulty walking, Hypomobility  Visit Diagnosis: Difficulty in walking, not elsewhere classified  Muscle weakness (generalized)  Other abnormalities of gait and mobility  Abnormal posture  Repeated falls     Problem List Patient Active Problem List   Diagnosis Date Noted  . Stage 4 chronic kidney disease ([place]) 06/30/2016  . Subdural hematoma, post-traumatic ([place]) 03/21/2016  . Subdural hematoma ([place]) 03/21/2016  . Malignant neoplasm of trigone of urinary bladder ([place]) 02/12/2016  . Bilirubinemia 12/12/2015  . Thrombocytopenia ([place]) 11/13/2015  . Cough 10/03/2015  . Allergic rhinitis 10/03/2015  . Secondary hyperparathyroidism ([place]) 09/06/2015  . Tremor, essential 08/27/2015  . History of colonic polyps   . Diverticulosis of colon without hemorrhage   . Malignant neoplasm of lateral wall of urinary bladder ([place]) 01/12/2014  . CIS (carcinoma in situ of bladder) 08/22/2013  . Static tremor 02/23/2013  . Diplopia 02/23/2013  . Diabetic polyneuropathy ([place]) 02/23/2013  . Abnormality of gait 02/23/2013  . COPD exacerbation ([place]) 01/31/2013  . Fracture of right distal radius 12/14/2012  . Diabetes mellitus ([place]) 11/30/2012  . Idiopathic thrombocytopenic purpura ([place]) 09/07/2012  . Depression 07/28/2012  . Benign prostate hyperplasia 06/10/2012  .  LLQ pain 11/13/2011  . Hernia of anterior abdominal wall 10/28/2011  . AAA (abdominal aortic aneurysm) ([place])   . Tobacco abuse   . Diarrhea 10/22/2011  . Tubular adenoma of colon   . Bladder neck obstruction  06/05/2011  . Obstructive sleep apnea on CPAP 03/27/2011  . Fasting hyperglycemia 03/27/2011  . Recurrent nephrolithiasis 03/27/2011  . Carcinoma of bladder ([place]) 03/27/2011  . COPD (chronic obstructive pulmonary disease) ([place]) 02/24/2011  . Adenocarcinoma of right lung ([place]) 02/24/2011  . CAD (coronary artery disease) 01/06/2011  . Hypertension 01/06/2011  . Chronic kidney disease, stage III (moderate) ([place]) 01/06/2011    Deniece Ree PT, DPT, CBIS  Supplemental Physical Therapist [facility] [address] [place], [place], [address] Phone: [phone]   Fax:  [phone]  Name: Kyle [name] MRN: [id number] Date of Birth: 08/02/1933

## 2017-06-09 ENCOUNTER — Ambulatory Visit (HOSPITAL_COMMUNITY): Payer: Medicare Other | Admitting: Physical Therapy

## 2017-06-09 ENCOUNTER — Encounter (HOSPITAL_COMMUNITY): Payer: Self-pay | Admitting: Physical Therapy

## 2017-06-09 ENCOUNTER — Telehealth (HOSPITAL_COMMUNITY): Payer: Self-pay | Admitting: Physical Therapy

## 2017-06-09 DIAGNOSIS — R293 Abnormal posture: Secondary | ICD-10-CM

## 2017-06-09 DIAGNOSIS — R2689 Other abnormalities of gait and mobility: Secondary | ICD-10-CM

## 2017-06-09 DIAGNOSIS — R262 Difficulty in walking, not elsewhere classified: Secondary | ICD-10-CM | POA: Diagnosis not present

## 2017-06-09 DIAGNOSIS — M6281 Muscle weakness (generalized): Secondary | ICD-10-CM

## 2017-06-09 DIAGNOSIS — R296 Repeated falls: Secondary | ICD-10-CM

## 2017-06-09 NOTE — Therapy (Addendum)
[place], [place], [address] Phone: [phone]   Fax:  [phone]  Physical Therapy Treatment  Patient Details  Name: Kyle [name] MRN: [id number] Date of Birth: Nov 30, 1933 Referring Provider: Nicoletta Ba    Encounter Date: 06/09/2017  PT End of Session - 06/09/17 1433    Visit Number  6    Number of Visits  16    Date for PT Re-Evaluation  06/20/17    Authorization Type  UHC Medicare     Authorization Time Period  05/21/17-07/20/17    Authorization - Visit Number  6    Authorization - Number of Visits  10    PT Start Time  4967    PT Stop Time  1427    PT Time Calculation (min)  40 min    Activity Tolerance  Patient tolerated treatment well;Patient limited by fatigue    Behavior During Therapy  [facility] for tasks assessed/performed       Past Medical History:  Diagnosis Date  . AAA (abdominal aortic aneurysm) ([place]) 2004   s/p repair 2004; 4.3 cm infrarenal in 05/2011  . Abnormality of gait 02/23/2013  . Adenocarcinoma of right lung ([place]) 02/24/2011   Ct A/P 2012:  2cm lung mass RLL PET 5916:  Hypermetabolic RLL mass, no other hypermetabolic areas. TTNA 02/2011:  Adenocarcinoma, markers c/w lung origin Right lower lobe superior segmentectomy. 04/01/2011 Dr. Arlyce Dice   . Adenocarcinoma, lung ([place]) 01/2011   transthoracic FNA; resection of the superior segment of the RLL in 03/2011; negative nodes; no chemotherapy nor radiation planned  . Arm fracture    right arm  . Arteriosclerotic cardiovascular disease (ASCVD) 1973, 12/2010   S/P NSTEMI secondary to distal RCA/PL lesion, tx medically.  EF of  55%-60% per  echo.  . Benign prostatic hypertrophy    s/p transurethral resection of the prostate  . Bilateral renal masses    Cystic, more prominent on CT in 12/2010 than 2007; followed by Dr. Rosana Hoes  . Bladder cancer [facility]) 1996   Transurethral resection of the bladder + chemotherapy/BCG as premed  . Cataract   . Chronic  kidney disease    Creatinine 1.4 on discharge 12/20/2100; proteinuria; normal renal ultrasound in 2010; recent creatinines of 1.7-2.; Bilateral cystic renal masses by CT in 2011  . COPD (chronic obstructive pulmonary disease) ([place])   . Coronary artery disease   . Cough    thick phlegm  . Diabetes mellitus    Type II  . Diplopia 02/23/2013  . Diverticulosis   . Essential and other specified forms of tremor 02/23/2013  . Hx of Clostridium difficile infection   . Hyperlipidemia   . Hypertension   . Insomnia   . ITP (idiopathic thrombocytopenic purpura) 09/07/2012   Chronic ITP of adults versus medication-induced ITP.  Stable  . Myocardial infarction ([place])   . Nephrolithiasis 2012   ARF in 01/2011 due to obstructing nephrolithiasis  . Obesity   . OSA (obstructive sleep apnea)   . Polyneuropathy in diabetes(357.2) 02/23/2013  . Thrombocytopenia ([place])   . Tobacco abuse    50-pack-year consumption; quit in 12/2010  . Tubular adenoma of colon   . Ventral hernia     Past Surgical History:  Procedure Laterality Date  . ABDOMINAL AORTIC ANEURYSM REPAIR  2004  . CARDIAC CATHETERIZATION    . COLONOSCOPY  03/19/2010   Dr. Gala Romney -(poor prep) Anal papilla, rectal hyperplastic polyp, tubular adenoma removed splenic flexure, left-sided diverticula  .  COLONOSCOPY  11/28/2004   RMR:  Diminutive rectal and left colon polyps as described above, cold  biopsied/removed/  Left sided diverticula. The remainder of the colonic mucosa appeared normal.  . COLONOSCOPY   09/15/01   RMR: Multiple diminutive polyps destroyed with dermolysis as described above/ Multiple small polyps on stalks in the colon resected with snare cautery/ Scattered pan colonic diverticulum/ The remainder of the colonic mucosa appeared normal  . COLONOSCOPY N/A 05/01/2015   Procedure: COLONOSCOPY;  Surgeon: Daneil Dolin, MD;  [facility];  Service: Endoscopy;  Laterality: N/A;  1115  . CYSTECTOMY    . CYSTOSCOPY  04/2014  .  CYSTOSTOMY W/ BLADDER BIOPSY    . FLEXIBLE SIGMOIDOSCOPY  2014   Dr. Olevia Perches: tubular adenoma, negative stool studies   . LUNG LOBECTOMY    . TONSILLECTOMY    . TRANSURETHRAL RESECTION OF PROSTATE    . VIDEO BRONCHOSCOPY WITH ENDOBRONCHIAL NAVIGATION N/A 10/10/2013   Procedure: VIDEO BRONCHOSCOPY WITH ENDOBRONCHIAL NAVIGATION;  Surgeon: Melrose Nakayama, MD;  Location: [facility];  Service: Thoracic;  Laterality: N/A;  NO BLOOD THINNERS BUT PATIENT HAS ITP  . WEDGE RESECTION  04/2011   carcinoma of lung    There were no vitals filed for this visit.  Subjective Assessment - 06/09/17 1350    Subjective  Patient arrives stating that he is not feeling as good as he did yesterday, he has a UTI and he feels this might be effecting him     Pertinent History  Right flank drainage tube (bladder to resevoir)     Currently in Pain?  Yes    Pain Score  4     Pain Location  Knee    Pain Orientation  Right;Left    Pain Descriptors / Indicators  Aching    Pain Type  Chronic pain    Pain Radiating Towards  none    Pain Onset  More than a month ago    Pain Frequency  Constant    Aggravating Factors   certain movements     Pain Relieving Factors  getting off of feet     Effect of Pain on Daily Activities  mild-moderte                       [facility] Adult PT Treatment/Exercise - 06/09/17 0001      Ambulation/Gait   Pre-Gait Activities  AMB 1x 225 min guard with SPC      Lumbar Exercises: Standing   Forward Lunge  10 reps;Limitations 4" box    Forward Lunge Limitations  4 inch box, BLE    Other Standing Lumbar Exercises  retro walking 3x up/back in // bars; low hurdles (3) forward step over 3x up/back in // bars; low hurdles (3) laterl step over 3x up/back in // bars      Lumbar Exercises: Seated   Long Arc Quad on Chair  Both;1 set;10 reps;Limitations    LAQ on Chair Limitations  5 lbs, seated on dynadisc, patient required tactile cues to facilitate forward lean for upright posture  and seated balance challenge      Knee/Hip Exercises: Standing   Hip Abduction  Both;1 set;10 reps;Knee straight;Limitations;Stengthening;AROM    Abduction Limitations  2 lbs        HR/O2 at baseline: 84BPM 94% O2 room air, seated rest; measures varied throughout session however at lowest were 85% O2 and HR 172BPM after ambulating 232ft, both of which slowly recovered to  being WNL with seated rest and cues for pursed lip breathing.      PT Education - 06/09/17 1432    Education provided  Yes    Education Details  patient education regarding pursed lip breathing, importance of ocrrect breathing techniques, pursed lip breathing program/HEP at home with more frequent pulse ox checks     Person(s) Educated  Patient    Methods  Explanation;Handout    Comprehension  Verbalized understanding;Returned demonstration       PT Short Term Goals - 05/21/17 1901      PT SHORT TERM GOAL #1   Title  Pt will demo consistency and independence with his HEP to improve pain and tolerance to household distance AMB.     Status  New      PT SHORT TERM GOAL #2   Title  After 4 weeks patient will demonstrate ability to AMB >272ft s foot scuffing, evidence of improved foot clearnance bilat.     Status  New      PT SHORT TERM GOAL #3   Title  After 4 weeks patient will demonstrate improved functional strenght AEB ability to perform 5xSTS hands free in <20sec from a surface of 22-inches or lower.      Status  New        PT Long Term Goals - 05/21/17 1905      PT LONG TERM GOAL #1   Title  After 8 weeks pt will demonstrate improved power output AEB 5x sit to stand (hands free) in less than 18s from 19" surface.     Status  New      PT LONG TERM GOAL #2   Title  After 8 weeks patient will demonstrate 4/5 in all hip and knee muscle groups AEB MMT.       PT LONG TERM GOAL #3   Title  After 8 weeks patient will demonstrate ability to perform 1056feet AMB at >0.11m/s with <2/10 low back pain to improve  ability to access the community.     Status  New            Plan - 06/09/17 1433    Clinical Impression Statement  Patient arrives today stating he is not feeling 100% however he will do his best. Continued with standing exercises as tolerated, with rest breaks provided as needed and education/demonstration of pursed lip breathing provided during rest breaks. Patient did appear to complete more activities today as compared to last session, however continues to be limited by fatigue and gross O2 desaturation with activity on room air. HEP provided today for pursed lip breathing strategy. Patient did experience some L knee pain during standing hip ABD however this resolved with termination of activity.     Rehab Potential  Fair    PT Frequency  2x / week    PT Duration  8 weeks    PT Treatment/Interventions  ADLs/Self Care Home Management;Moist Heat;Gait training;Stair training;Functional mobility training;Therapeutic activities;Therapeutic exercise;Patient/family education;Balance training;Passive range of motion    PT Next Visit Plan  Continue to improve strength in BLE.  Increase ambulation tolerance/independence.  Watch vitals due to deconditioning.     Consulted and Agree with Plan of Care  Patient       Patient will benefit from skilled therapeutic intervention in order to improve the following deficits and impairments:  Abnormal gait, Pain, Improper body mechanics, Postural dysfunction, Decreased mobility, Decreased coordination, Decreased endurance, Decreased activity tolerance, Decreased strength, Difficulty walking, Hypomobility  Visit Diagnosis:  Difficulty in walking, not elsewhere classified  Muscle weakness (generalized)  Other abnormalities of gait and mobility  Abnormal posture  Repeated falls     Problem List Patient Active Problem List   Diagnosis Date Noted  . Stage 4 chronic kidney disease ([place]) 06/30/2016  . Subdural hematoma, post-traumatic ([place]) 03/21/2016   . Subdural hematoma ([place]) 03/21/2016  . Malignant neoplasm of trigone of urinary bladder ([place]) 02/12/2016  . Bilirubinemia 12/12/2015  . Thrombocytopenia ([place]) 11/13/2015  . Cough 10/03/2015  . Allergic rhinitis 10/03/2015  . Secondary hyperparathyroidism ([place]) 09/06/2015  . Tremor, essential 08/27/2015  . History of colonic polyps   . Diverticulosis of colon without hemorrhage   . Malignant neoplasm of lateral wall of urinary bladder ([place]) 01/12/2014  . CIS (carcinoma in situ of bladder) 08/22/2013  . Static tremor 02/23/2013  . Diplopia 02/23/2013  . Diabetic polyneuropathy ([place]) 02/23/2013  . Abnormality of gait 02/23/2013  . COPD exacerbation ([place]) 01/31/2013  . Fracture of right distal radius 12/14/2012  . Diabetes mellitus ([place]) 11/30/2012  . Idiopathic thrombocytopenic purpura ([place]) 09/07/2012  . Depression 07/28/2012  . Benign prostate hyperplasia 06/10/2012  . LLQ pain 11/13/2011  . Hernia of anterior abdominal wall 10/28/2011  . AAA (abdominal aortic aneurysm) ([place])   . Tobacco abuse   . Diarrhea 10/22/2011  . Tubular adenoma of colon   . Bladder neck obstruction 06/05/2011  . Obstructive sleep apnea on CPAP 03/27/2011  . Fasting hyperglycemia 03/27/2011  . Recurrent nephrolithiasis 03/27/2011  . Carcinoma of bladder ([place]) 03/27/2011  . COPD (chronic obstructive pulmonary disease) ([place]) 02/24/2011  . Adenocarcinoma of right lung ([place]) 02/24/2011  . CAD (coronary artery disease) 01/06/2011  . Hypertension 01/06/2011  . Chronic kidney disease, stage III (moderate) ([place]) 01/06/2011    Deniece Ree PT, DPT, CBIS  Supplemental Physical Therapist [facility] [address] [place], [place], [address] Phone: [phone]   Fax:  [phone]  Name: Kyle [name] MRN: [id number] Date of Birth: 1934-07-12

## 2017-06-09 NOTE — Telephone Encounter (Signed)
Called Dr. Grace Bushy Luking's office immediately following PT session today, educated nursing staff and Dr. Wolfgang Phoenix directly regarding patient's O2 sats dropping into the 80s with gait and exertion in PT. Also stated that while patient is able to recover with skilled cues for pursed lip breathing on room air, it seems unlikely that he is able to maintain O2 sats on his own at home during exertion.   Dr. Wolfgang Phoenix was receptive to PT concern and reports that he will plan to follow up with patient regarding this problem and possible home O2.   PT will plan to continue to monitor vitals during skilled sessions moving forward.   Deniece Ree PT, DPT, CBIS  Supplemental Physical Therapist [facility]

## 2017-06-09 NOTE — Patient Instructions (Signed)
   BREATHING TECHNIQUE: perform when feeling short of breath   "smell the roses, and blow out the candles"  Breathe in for a 4 second count through your nose, breathe out for an 8 second count through pursed lips (mouth)

## 2017-06-10 ENCOUNTER — Telehealth (HOSPITAL_COMMUNITY): Payer: Self-pay | Admitting: Physical Therapy

## 2017-06-10 ENCOUNTER — Ambulatory Visit (INDEPENDENT_AMBULATORY_CARE_PROVIDER_SITE_OTHER)
Admission: RE | Admit: 2017-06-10 | Discharge: 2017-06-10 | Disposition: A | Discharge status: Home / Self Care | Payer: Medicare Other | Source: Ambulatory Visit | Attending: Adult Health | Admitting: Adult Health

## 2017-06-10 ENCOUNTER — Ambulatory Visit (INDEPENDENT_AMBULATORY_CARE_PROVIDER_SITE_OTHER): Payer: Medicare Other | Admitting: Adult Health

## 2017-06-10 ENCOUNTER — Ambulatory Visit: Payer: Medicare Other | Admitting: Podiatry

## 2017-06-10 ENCOUNTER — Telehealth: Payer: Self-pay | Admitting: Pulmonary Disease

## 2017-06-10 ENCOUNTER — Encounter: Payer: Self-pay | Admitting: Adult Health

## 2017-06-10 VITALS — BP 122/70 | HR 75 | Ht 72.0 in | Wt 261.8 lb

## 2017-06-10 DIAGNOSIS — R05 Cough: Secondary | ICD-10-CM | POA: Diagnosis not present

## 2017-06-10 DIAGNOSIS — C3491 Malignant neoplasm of unspecified part of right bronchus or lung: Secondary | ICD-10-CM

## 2017-06-10 DIAGNOSIS — Z9989 Dependence on other enabling machines and devices: Secondary | ICD-10-CM | POA: Diagnosis not present

## 2017-06-10 DIAGNOSIS — J449 Chronic obstructive pulmonary disease, unspecified: Secondary | ICD-10-CM

## 2017-06-10 DIAGNOSIS — G4733 Obstructive sleep apnea (adult) (pediatric): Secondary | ICD-10-CM

## 2017-06-10 DIAGNOSIS — R0602 Shortness of breath: Secondary | ICD-10-CM | POA: Diagnosis not present

## 2017-06-10 MED ORDER — TIOTROPIUM BROMIDE-OLODATEROL 2.5-2.5 MCG/ACT IN AERS: 2.0000 | INHALATION_SPRAY | Freq: Every day | RESPIRATORY_TRACT | 5 refills | Status: DC

## 2017-06-10 MED ORDER — TIOTROPIUM BROMIDE-OLODATEROL 2.5-2.5 MCG/ACT IN AERS: 2.0000 | INHALATION_SPRAY | Freq: Every day | RESPIRATORY_TRACT | 0 refills | Status: DC

## 2017-06-10 NOTE — Assessment & Plan Note (Signed)
Check cxr today .  

## 2017-06-10 NOTE — Patient Instructions (Addendum)
Chest xray today .  Hold Spiriva .  Begin Stiolto 2 puff daily .  Activity as tolerated.  Work on not smoking .  Follow up with Dr. [place] Bells in 4-6 weeks and As needed   Please contact office for sooner follow up if symptoms do not improve or worsen or seek emergency care

## 2017-06-10 NOTE — Assessment & Plan Note (Signed)
Cont on CPAP  

## 2017-06-10 NOTE — Progress Notes (Signed)
@Patient  ID: Kyle [name], male    DOB: May 09, 1934, 83 y.o.   MRN: [id number]  Chief Complaint  Patient presents with  . Follow-up    Emphysema     Referring provider: Mikey Kirschner, MD  HPI: 83 yo male smoker followed for COPD and OSA  Lung Cancer -Stage 1 [place], resection 2012)  Urethral and Bladder Cancer undergoing chemo/immunotherapy -[facility] .   TEST  2012 PFT -Mild COPD    06/10/2017 Follow up ; COPD and OSA  Pt returns for follow up for COPD . Says overall has been doing okay without flare of cough or wheezing . Marland Kitchen Goes to PT at [facility] and has noticed that O2 sats drop with exercise /walking .  Walk test in office today with O2 sats at 94% rest on room air  and O2 sats walking on room air at 92%. Remains on Spiriva daily.  Does get winded with walking . Still smoking , discussed cessation . No hemoptysis , chest pain , edema or orthopnea.   Has been undergoing immunotheratpy at [facility] for Bladder cancer . Has been real weak.  Says muscles are very weak, gives out of energy easily .   Has OSA on CPAP with Oxygen never  misses a night .    Allergies  Allergen Reactions  . Codeine Anaphylaxis  . Etodolac Other (See Comments)    dizziness    Immunization History  Administered Date(s) Administered  . DT 06/10/2013  . Influenza Split 05/21/2012  . Influenza Whole 03/22/2011  . Influenza, Seasonal, Injecte, Preservative Fre 04/18/2016  . Influenza,inj,Quad PF,6+ Mos 05/15/2014, 05/22/2015, 05/04/2017  . Influenza-Unspecified 05/04/2013  . Pneumococcal Conjugate-13 05/15/2014  . Pneumococcal-Unspecified 11/16/2007  . Tdap 03/21/2016  . Zoster 11/06/2008    Past Medical History:  Diagnosis Date  . AAA (abdominal aortic aneurysm) ([place]) 2004   s/p repair 2004; 4.3 cm infrarenal in 05/2011  . Abnormality of gait 02/23/2013  . Adenocarcinoma of right lung ([place]) 02/24/2011   Ct A/P 2012:  2cm lung mass RLL PET 7628:  Hypermetabolic RLL mass, no other  hypermetabolic areas. TTNA 02/2011:  Adenocarcinoma, markers c/w lung origin Right lower lobe superior segmentectomy. 04/01/2011 Dr. Arlyce Dice   . Adenocarcinoma, lung ([place]) 01/2011   transthoracic FNA; resection of the superior segment of the RLL in 03/2011; negative nodes; no chemotherapy nor radiation planned  . Arm fracture    right arm  . Arteriosclerotic cardiovascular disease (ASCVD) 1973, 12/2010   S/P NSTEMI secondary to distal RCA/PL lesion, tx medically.  EF of  55%-60% per  echo.  . Benign prostatic hypertrophy    s/p transurethral resection of the prostate  . Bilateral renal masses    Cystic, more prominent on CT in 12/2010 than 2007; followed by Dr. Rosana Hoes  . Bladder cancer [facility]) 1996   Transurethral resection of the bladder + chemotherapy/BCG as premed  . Cataract   . Chronic kidney disease    Creatinine 1.4 on discharge 12/20/2100; proteinuria; normal renal ultrasound in 2010; recent creatinines of 1.7-2.; Bilateral cystic renal masses by CT in 2011  . COPD (chronic obstructive pulmonary disease) ([place])   . Coronary artery disease   . Cough    thick phlegm  . Diabetes mellitus    Type II  . Diplopia 02/23/2013  . Diverticulosis   . Essential and other specified forms of tremor 02/23/2013  . Hx of Clostridium difficile infection   . Hyperlipidemia   . Hypertension   .  Insomnia   . ITP (idiopathic thrombocytopenic purpura) 09/07/2012   Chronic ITP of adults versus medication-induced ITP.  Stable  . Myocardial infarction ([place])   . Nephrolithiasis 2012   ARF in 01/2011 due to obstructing nephrolithiasis  . Obesity   . OSA (obstructive sleep apnea)   . Polyneuropathy in diabetes(357.2) 02/23/2013  . Thrombocytopenia ([place])   . Tobacco abuse    50-pack-year consumption; quit in 12/2010  . Tubular adenoma of colon   . Ventral hernia     Tobacco History: Social History   Tobacco Use  Smoking Status Current Every Day Smoker  . Packs/day: 1.00  . Years: 50.00  . Pack years: 50.00    . Types: Cigarettes  . Start date: 07/22/1955  . Last attempt to quit: 12/19/2012  . Years since quitting: 4.4  Smokeless Tobacco Never Used   Ready to quit: No Counseling given: Yes   Outpatient Encounter Medications as of 06/10/2017  Medication Sig  . acetaminophen (TYLENOL) 500 MG tablet Take 500-1,000 mg by mouth at bedtime.  Marland Kitchen albuterol (PROAIR HFA) 108 (90 BASE) MCG/ACT inhaler Inhale 2 puffs into the lungs every 6 (six) hours as needed for wheezing.  Marland Kitchen ALPRAZolam (XANAX) 0.5 MG tablet TAKE ONE TABLET TWICE DAILY AS NEEDED FOR ANXIETY  . aspirin EC 81 MG tablet Take 81 mg by mouth daily.  . cetirizine (ZYRTEC) 10 MG tablet Take 1 tablet (10 mg total) by mouth daily.  . cholecalciferol (VITAMIN D) 1000 units tablet Take 2,000 Units by mouth daily.  . citalopram (CELEXA) 20 MG tablet TAKE ONE (1) TABLET BY MOUTH EVERY DAY  . fluticasone (FLONASE) 50 MCG/ACT nasal spray Place 1 spray into both nostrils daily.  . metoprolol tartrate (LOPRESSOR) 50 MG tablet Take 1/2 tablet by mouth twice daily  . niacin (NIASPAN) 500 MG CR tablet Take 2 tablets (1,000 mg total) by mouth at bedtime.  . nitroGLYCERIN (NITROSTAT) 0.4 MG SL tablet Place 1 tablet (0.4 mg total) under the tongue every 5 (five) minutes as needed. Call MD if need more than 2 (Patient taking differently: Place 0.4 mg under the tongue every 5 (five) minutes as needed for chest pain. Call MD if need more than 2)  . Pembrolizumab (KEYTRUDA IV) Inject into the vein every 21 ( twenty-one) days. At [facility]  . pravastatin (PRAVACHOL) 80 MG tablet Take 1 tablet (80 mg total) by mouth daily.  . Probiotic Product (PROBIOTIC PEARLS ADVANTAGE) CAPS Take 1 capsule by mouth daily.  Marland Kitchen tiotropium (SPIRIVA HANDIHALER) 18 MCG inhalation capsule Place 1 capsule (18 mcg total) into inhaler and inhale daily.  . vitamin B-12 1000 MCG tablet Take 1 tablet (1,000 mcg total) by mouth daily.   No facility-administered encounter medications on file as of  06/10/2017.      Review of Systems  Constitutional:   No  weight loss, night sweats,  Fevers, chills,  +fatigue, or  lassitude.  HEENT:   No headaches,  Difficulty swallowing,  Tooth/dental problems, or  Sore throat,                No sneezing, itching, ear ache, nasal congestion, post nasal drip,   CV:  No chest pain,  Orthopnea, PND, swelling in lower extremities, anasarca, dizziness, palpitations, syncope.   GI  No heartburn, indigestion, abdominal pain, nausea, vomiting, diarrhea, change in bowel habits, loss of appetite, bloody stools.   Resp: .  No excess mucus, no productive cough,  No non-productive cough,  No coughing up of  blood.  No change in color of mucus.  No wheezing.  No chest wall deformity  Skin: no rash or lesions.  GU: no dysuria, change in color of urine, no urgency or frequency.  No flank pain, no hematuria   MS:  No joint pain or swelling.  No decreased range of motion.  No back pain.    Physical Exam  BP 122/70 (BP Location: Left Arm, Cuff Size: Normal)   Pulse 75   Ht 6' (1.829 m)   Wt 261 lb 12.8 oz (118.8 kg)   SpO2 94%   BMI 35.51 kg/m   GEN: A/Ox3; pleasant , NAD, elderly , cane    HEENT:  [place]/AT,  EACs-clear, TMs-wnl, NOSE-clear, THROAT-clear, no lesions, no postnasal drip or exudate noted.   NECK:  Supple w/ fair ROM; no JVD; normal carotid impulses w/o bruits; no thyromegaly or nodules palpated; no lymphadenopathy.    RESP  Clear  P & A; w/o, wheezes/ rales/ or rhonchi. no accessory muscle use, no dullness to percussion  CARD:  RRR, no m/r/g, no peripheral edema, pulses intact, no cyanosis or clubbing.  GI:   Soft & nt; nml bowel sounds; no organomegaly or masses detected.   Musco: Warm bil, no deformities or joint swelling noted.   Neuro: alert, no focal deficits noted.    Skin: Warm, no lesions or rashes    Lab Results:  CBC   BNP No results found for: BNP   Imaging: No results found.   Assessment & Plan:   COPD  (chronic obstructive pulmonary disease) ([place]) Appears deconditioned , with underlying bladder cancer undergoing immunotherapy  No desats with ambulation in office  Will try LAMA/LABA combo to see if this helps  Check cxr   Plan  Patient Instructions  Chest xray today .  Hold Spiriva .  Begin Stiolto 2 puff daily .  Activity as tolerated.  Work on not smoking .  Follow up with Dr. Lake Bells in 4-6 weeks and As needed   Please contact office for sooner follow up if symptoms do not improve or worsen or seek emergency care       Adenocarcinoma of right lung Check cxr today   Obstructive sleep apnea on CPAP Cont on CPAP      Rexene Edison, NP 06/10/2017

## 2017-06-10 NOTE — Telephone Encounter (Signed)
Kyle [name] returned PT's phone call; PT explained concern regarding O2 saturation dropping to mid-80s even with activities such as walking 200 feet in the clinic on room air. Explained normal O2 saturation levels as well as relevance of O2 sat drop to his general health and well being.  Wife expressed understanding of all education today and reports they are going to pulmonary MD this morning and plan to talk about this issue with this provider.  Kyle [name] PT, DPT, CBIS  Supplemental Physical Therapist [facility]

## 2017-06-10 NOTE — Telephone Encounter (Signed)
Attempted to return patient's wife's phone call regarding O2 sats last session; left message in voicemail asking her to please return call.  Deniece Ree PT, DPT, CBIS  Supplemental Physical Therapist [facility]

## 2017-06-10 NOTE — Assessment & Plan Note (Signed)
Appears deconditioned , with underlying bladder cancer undergoing immunotherapy  No desats with ambulation in office  Will try LAMA/LABA combo to see if this helps  Check cxr   Plan  Patient Instructions  Chest xray today .  Hold Spiriva .  Begin Stiolto 2 puff daily .  Activity as tolerated.  Work on not smoking .  Follow up with Dr. Lake Bells in 4-6 weeks and As needed   Please contact office for sooner follow up if symptoms do not improve or worsen or seek emergency care

## 2017-06-10 NOTE — Addendum Note (Signed)
Addended by: Della Goo C on: 06/10/2017 11:57 AM   Modules accepted: Orders

## 2017-06-10 NOTE — Progress Notes (Signed)
Reviewed, agree 

## 2017-06-10 NOTE — Telephone Encounter (Signed)
Pt's spouse, Mariann Laster states pt's O2 is dropping in the 80s with any exertion. O2 level increased after applying cpap.  Pt has been scheduled for OV with TP at 10:45. Will close encounter, as nothing further is needed.

## 2017-06-10 NOTE — Addendum Note (Signed)
Addended by: Parke Poisson E on: 06/10/2017 01:22 PM   Modules accepted: Orders

## 2017-06-11 DIAGNOSIS — G4733 Obstructive sleep apnea (adult) (pediatric): Secondary | ICD-10-CM | POA: Diagnosis not present

## 2017-06-15 ENCOUNTER — Ambulatory Visit: Payer: Medicare Other | Admitting: Family Medicine

## 2017-06-16 ENCOUNTER — Ambulatory Visit (HOSPITAL_COMMUNITY): Payer: Medicare Other | Admitting: Physical Therapy

## 2017-06-16 ENCOUNTER — Encounter (HOSPITAL_COMMUNITY): Payer: Self-pay | Admitting: Physical Therapy

## 2017-06-16 DIAGNOSIS — R262 Difficulty in walking, not elsewhere classified: Secondary | ICD-10-CM | POA: Diagnosis not present

## 2017-06-16 DIAGNOSIS — R293 Abnormal posture: Secondary | ICD-10-CM | POA: Diagnosis not present

## 2017-06-16 DIAGNOSIS — R296 Repeated falls: Secondary | ICD-10-CM | POA: Diagnosis not present

## 2017-06-16 DIAGNOSIS — R2689 Other abnormalities of gait and mobility: Secondary | ICD-10-CM

## 2017-06-16 DIAGNOSIS — M6281 Muscle weakness (generalized): Secondary | ICD-10-CM

## 2017-06-16 NOTE — Therapy (Signed)
[place], [place], [address] Phone: [phone]   Fax:  [phone]  Physical Therapy Treatment  Patient Details  Name: Kyle [name] MRN: [id number] Date of Birth: 07/07/1934 Referring Provider: Nicoletta Ba    Encounter Date: 06/16/2017  PT End of Session - 06/16/17 1343    Visit Number  7    Number of Visits  16    Date for PT Re-Evaluation  06/20/17    Authorization Type  [employer] Medicare     Authorization Time Period  05/21/17-07/20/17    Authorization - Visit Number  7    Authorization - Number of Visits  10    PT Start Time  1304    PT Stop Time  1343    PT Time Calculation (min)  39 min    Activity Tolerance  Patient tolerated treatment well    Behavior During Therapy  [facility] for tasks assessed/performed       Past Medical History:  Diagnosis Date  . AAA (abdominal aortic aneurysm) ([place]) 2004   s/p repair 2004; 4.3 cm infrarenal in 05/2011  . Abnormality of gait 02/23/2013  . Adenocarcinoma of right lung ([place]) 02/24/2011   Ct A/P 2012:  2cm lung mass RLL PET 9326:  Hypermetabolic RLL mass, no other hypermetabolic areas. TTNA 02/2011:  Adenocarcinoma, markers c/w lung origin Right lower lobe superior segmentectomy. 04/01/2011 Dr. Arlyce Dice   . Adenocarcinoma, lung ([place]) 01/2011   transthoracic FNA; resection of the superior segment of the RLL in 03/2011; negative nodes; no chemotherapy nor radiation planned  . Arm fracture    right arm  . Arteriosclerotic cardiovascular disease (ASCVD) 1973, 12/2010   S/P NSTEMI secondary to distal RCA/PL lesion, tx medically.  EF of  55%-60% per  echo.  . Benign prostatic hypertrophy    s/p transurethral resection of the prostate  . Bilateral renal masses    Cystic, more prominent on CT in 12/2010 than 2007; followed by Dr. Rosana Hoes  . Bladder cancer [facility]) 1996   Transurethral resection of the bladder + chemotherapy/BCG as premed  . Cataract   . Chronic kidney disease    Creatinine 1.4 on discharge 12/20/2100; proteinuria; normal renal ultrasound in 2010; recent creatinines of 1.7-2.; Bilateral cystic renal masses by CT in 2011  . COPD (chronic obstructive pulmonary disease) ([place])   . Coronary artery disease   . Cough    thick phlegm  . Diabetes mellitus    Type II  . Diplopia 02/23/2013  . Diverticulosis   . Essential and other specified forms of tremor 02/23/2013  . Hx of Clostridium difficile infection   . Hyperlipidemia   . Hypertension   . Insomnia   . ITP (idiopathic thrombocytopenic purpura) 09/07/2012   Chronic ITP of adults versus medication-induced ITP.  Stable  . Myocardial infarction ([place])   . Nephrolithiasis 2012   ARF in 01/2011 due to obstructing nephrolithiasis  . Obesity   . OSA (obstructive sleep apnea)   . Polyneuropathy in diabetes(357.2) 02/23/2013  . Thrombocytopenia ([place])   . Tobacco abuse    50-pack-year consumption; quit in 12/2010  . Tubular adenoma of colon   . Ventral hernia     Past Surgical History:  Procedure Laterality Date  . ABDOMINAL AORTIC ANEURYSM REPAIR  2004  . CARDIAC CATHETERIZATION    . COLONOSCOPY  03/19/2010   Dr. Gala Romney -(poor prep) Anal papilla, rectal hyperplastic polyp, tubular adenoma removed splenic flexure, left-sided diverticula  . COLONOSCOPY  11/28/2004  RMR:  Diminutive rectal and left colon polyps as described above, cold  biopsied/removed/  Left sided diverticula. The remainder of the colonic mucosa appeared normal.  . COLONOSCOPY   09/15/01   RMR: Multiple diminutive polyps destroyed with dermolysis as described above/ Multiple small polyps on stalks in the colon resected with snare cautery/ Scattered pan colonic diverticulum/ The remainder of the colonic mucosa appeared normal  . COLONOSCOPY N/A 05/01/2015   Procedure: COLONOSCOPY;  Surgeon: Daneil Dolin, MD;  Location: [facility] ENDO [facility]ITE;  Service: Endoscopy;  Laterality: N/A;  1115  . CYSTECTOMY    . CYSTOSCOPY  04/2014  . CYSTOSTOMY W/ BLADDER  BIOPSY    . FLEXIBLE SIGMOIDOSCOPY  2014   Dr. Olevia Perches: tubular adenoma, negative stool studies   . LUNG LOBECTOMY    . TONSILLECTOMY    . TRANSURETHRAL RESECTION OF PROSTATE    . VIDEO BRONCHOSCOPY WITH ENDOBRONCHIAL NAVIGATION N/A 10/10/2013   Procedure: VIDEO BRONCHOSCOPY WITH ENDOBRONCHIAL NAVIGATION;  Surgeon: Melrose Nakayama, MD;  Location: [facility];  Service: Thoracic;  Laterality: N/A;  NO BLOOD THINNERS BUT PATIENT HAS ITP  . WEDGE RESECTION  04/2011   carcinoma of lung    There were no vitals filed for this visit.  Subjective Assessment - 06/16/17 1308    Subjective  Patient arrives stating the pulmonologist changed some of his medicines, htey did not put him on O2. He continues to feel somewhat short of breath.     Currently in Pain?  No/denies                      [facility] Adult PT Treatment/Exercise - 06/16/17 0001      Ambulation/Gait   Pre-Gait Activities  Amb 287ft x2, rest and other exercises in between       Lumbar Exercises: Standing   Heel Raises  20 reps heel and toe     Functional Squats  10 reps limited by PT due to L knee pain     Other Standing Lumbar Exercises  forward and lateral step ups x10 R 6 iinch box B HHA  4 inch box x15 L knee forward and lateral due to pain    Other Standing Lumbar Exercises  standing marches 1x20 solid surface              PT Education - 06/16/17 1343    Education provided  No       PT Short Term Goals - 05/21/17 1901      PT SHORT TERM GOAL #1   Title  Pt will demo consistency and independence with his HEP to improve pain and tolerance to household distance AMB.     Status  New      PT SHORT TERM GOAL #2   Title  After 4 weeks patient will demonstrate ability to AMB >264ft s foot scuffing, evidence of improved foot clearnance bilat.     Status  New      PT SHORT TERM GOAL #3   Title  After 4 weeks patient will demonstrate improved functional strenght AEB ability to perform 5xSTS hands free in  <20sec from a surface of 22-inches or lower.      Status  New        PT Long Term Goals - 05/21/17 1905      PT LONG TERM GOAL #1   Title  After 8 weeks pt will demonstrate improved power output AEB 5x sit to stand (hands free) in less  than 18s from 19" surface.     Status  New      PT LONG TERM GOAL #2   Title  After 8 weeks patient will demonstrate 4/5 in all hip and knee muscle groups AEB MMT.       PT LONG TERM GOAL #3   Title  After 8 weeks patient will demonstrate ability to perform 1089feet AMB at >0.53m/s with <2/10 low back pain to improve ability to access the community.     Status  New            Plan - 06/16/17 1343    Clinical Impression Statement  Patient arrives today reporting his pulmonologist changed some medicines but did not recommend home O2. Noted that patient remains short of breath during session and continued to check vitals throughout session today. Generally continued to work on progression of functional strength and functional activity tolerance this session, with rest provided as needed. Patient continues to remain motivated to participate with skilled PT services. Note that O2 is now remaining in the 90s during skilled PT session however recommend continuing to intermittently monitor moving forward.     Rehab Potential  Fair    PT Frequency  2x / week    PT Duration  8 weeks    PT Treatment/Interventions  ADLs/Self Care Home Management;Moist Heat;Gait training;Stair training;Functional mobility training;Therapeutic activities;Therapeutic exercise;Patient/family education;Balance training;Passive range of motion    PT Next Visit Plan  Continue to improve strength in BLE.  Increase ambulation tolerance/independence.  Watch vitals due to deconditioning. Re-assess     Consulted and Agree with Plan of Care  Patient       Patient will benefit from skilled therapeutic intervention in order to improve the following deficits and impairments:  Abnormal gait,  Pain, Improper body mechanics, Postural dysfunction, Decreased mobility, Decreased coordination, Decreased endurance, Decreased activity tolerance, Decreased strength, Difficulty walking, Hypomobility  Visit Diagnosis: Difficulty in walking, not elsewhere classified  Muscle weakness (generalized)  Other abnormalities of gait and mobility  Abnormal posture  Repeated falls     Problem List Patient Active Problem List   Diagnosis Date Noted  . Stage 4 chronic kidney disease ([place]) 06/30/2016  . Subdural hematoma, post-traumatic ([place]) 03/21/2016  . Subdural hematoma ([name]) 03/21/2016  . Malignant neoplasm of trigone of urinary bladder ([place]) 02/12/2016  . Bilirubinemia 12/12/2015  . Thrombocytopenia ([place]) 11/13/2015  . Cough 10/03/2015  . Allergic rhinitis 10/03/2015  . Secondary hyperparathyroidism ([place]) 09/06/2015  . Tremor, essential 08/27/2015  . History of colonic polyps   . Diverticulosis of colon without hemorrhage   . Malignant neoplasm of lateral wall of urinary bladder ([place]) 01/12/2014  . CIS (carcinoma in situ of bladder) 08/22/2013  . Static tremor 02/23/2013  . Diplopia 02/23/2013  . Diabetic polyneuropathy ([place]) 02/23/2013  . Abnormality of gait 02/23/2013  . COPD exacerbation ([place]) 01/31/2013  . Fracture of right distal radius 12/14/2012  . Diabetes mellitus ([place]) 11/30/2012  . Idiopathic thrombocytopenic purpura ([place]) 09/07/2012  . Depression 07/28/2012  . Benign prostate hyperplasia 06/10/2012  . LLQ pain 11/13/2011  . Hernia of anterior abdominal wall 10/28/2011  . AAA (abdominal aortic aneurysm) ([place])   . Tobacco abuse   . Diarrhea 10/22/2011  . Tubular adenoma of colon   . Bladder neck obstruction 06/05/2011  . Obstructive sleep apnea on CPAP 03/27/2011  . Fasting hyperglycemia 03/27/2011  . Recurrent nephrolithiasis 03/27/2011  . Carcinoma of bladder ([place]) 03/27/2011  . COPD (chronic obstructive pulmonary disease) ([place]) 02/24/2011  .  Adenocarcinoma  of right lung ([place]) 02/24/2011  . CAD (coronary artery disease) 01/06/2011  . Hypertension 01/06/2011  . Chronic kidney disease, stage III (moderate) ([place]) 01/06/2011    Deniece Ree PT, DPT, CBIS  Supplemental Physical Therapist [place] [address] [place], [place], [address] Phone: [phone]   Fax:  [phone]  Name: Kyle [name] MRN: [id number] Date of Birth: [birth year]

## 2017-06-18 ENCOUNTER — Ambulatory Visit (HOSPITAL_COMMUNITY): Payer: Medicare Other | Admitting: Physical Therapy

## 2017-06-18 ENCOUNTER — Encounter (HOSPITAL_COMMUNITY): Payer: Self-pay | Admitting: Physical Therapy

## 2017-06-18 DIAGNOSIS — R2689 Other abnormalities of gait and mobility: Secondary | ICD-10-CM

## 2017-06-18 DIAGNOSIS — R296 Repeated falls: Secondary | ICD-10-CM

## 2017-06-18 DIAGNOSIS — R262 Difficulty in walking, not elsewhere classified: Secondary | ICD-10-CM

## 2017-06-18 DIAGNOSIS — R293 Abnormal posture: Secondary | ICD-10-CM

## 2017-06-18 DIAGNOSIS — M6281 Muscle weakness (generalized): Secondary | ICD-10-CM

## 2017-06-18 NOTE — Therapy (Addendum)
[place], [place], [address] Phone: [phone]   Fax:  [phone]  Patient Details  Name: Kyle [name] MRN: [id number] Date of Birth: [birth year] Referring Provider:  Mikey Kirschner, MD   Addendum on 07/22/2017: PT called pt regarding his missed appointment and asked if he wanted to be rescheduled. He stated no and asked to be discharged. Educated pt that he could return with a referral if he needed/wanted to resume therapy services and he verbalized understanding.  PHYSICAL THERAPY DISCHARGE SUMMARY  Visits from Start of Care: 7  Current functional level related to goals / functional outcomes: See last treatment note   Remaining deficits: See last treatment note   Education / Equipment: Can return with referral Plan: Patient agrees to discharge.  Patient goals were not met. Patient is being discharged due to the patient's request.  ?????     Kyle [name] PT, DPT  -----  Encounter Date: 06/18/2017   Patient arrived with his pants soiled in multiple areas; he reports that his urine drainage bag/tube seem to have malfunctioned and leaked all over him. He thinks he fixed it but he is not sure.  Due to patient's soiled clothing and to prevent further embarrassment to patient, also due to possibly mis-functioning urine collection equipment, chose to hold today's session and will resume skilled PT services at next scheduled session.    Kyle [name] PT, DPT, CBIS  Supplemental Physical Therapist [place] [address] [place], [place], [address] Phone: [phone]   Fax:  [phone]

## 2017-06-22 ENCOUNTER — Telehealth (HOSPITAL_COMMUNITY): Payer: Self-pay | Admitting: Family Medicine

## 2017-06-22 DIAGNOSIS — R310 Gross hematuria: Secondary | ICD-10-CM | POA: Diagnosis not present

## 2017-06-22 DIAGNOSIS — I2510 Atherosclerotic heart disease of native coronary artery without angina pectoris: Secondary | ICD-10-CM | POA: Diagnosis not present

## 2017-06-22 DIAGNOSIS — G4733 Obstructive sleep apnea (adult) (pediatric): Secondary | ICD-10-CM | POA: Diagnosis not present

## 2017-06-22 DIAGNOSIS — N281 Cyst of kidney, acquired: Secondary | ICD-10-CM | POA: Diagnosis not present

## 2017-06-22 DIAGNOSIS — C661 Malignant neoplasm of right ureter: Secondary | ICD-10-CM | POA: Diagnosis not present

## 2017-06-22 DIAGNOSIS — N184 Chronic kidney disease, stage 4 (severe): Secondary | ICD-10-CM | POA: Diagnosis not present

## 2017-06-22 DIAGNOSIS — L03311 Cellulitis of abdominal wall: Secondary | ICD-10-CM | POA: Diagnosis not present

## 2017-06-22 DIAGNOSIS — Z923 Personal history of irradiation: Secondary | ICD-10-CM | POA: Diagnosis not present

## 2017-06-22 DIAGNOSIS — Z9221 Personal history of antineoplastic chemotherapy: Secondary | ICD-10-CM | POA: Diagnosis not present

## 2017-06-22 DIAGNOSIS — Z436 Encounter for attention to other artificial openings of urinary tract: Secondary | ICD-10-CM | POA: Diagnosis not present

## 2017-06-22 DIAGNOSIS — C679 Malignant neoplasm of bladder, unspecified: Secondary | ICD-10-CM | POA: Diagnosis not present

## 2017-06-22 DIAGNOSIS — Z8551 Personal history of malignant neoplasm of bladder: Secondary | ICD-10-CM | POA: Diagnosis not present

## 2017-06-22 DIAGNOSIS — Z85118 Personal history of other malignant neoplasm of bronchus and lung: Secondary | ICD-10-CM | POA: Diagnosis not present

## 2017-06-22 DIAGNOSIS — D61810 Antineoplastic chemotherapy induced pancytopenia: Secondary | ICD-10-CM | POA: Diagnosis not present

## 2017-06-22 DIAGNOSIS — T83512D Infection and inflammatory reaction due to nephrostomy catheter, subsequent encounter: Secondary | ICD-10-CM | POA: Diagnosis not present

## 2017-06-22 DIAGNOSIS — Z7982 Long term (current) use of aspirin: Secondary | ICD-10-CM | POA: Diagnosis not present

## 2017-06-22 DIAGNOSIS — R319 Hematuria, unspecified: Secondary | ICD-10-CM | POA: Diagnosis not present

## 2017-06-22 DIAGNOSIS — R5383 Other fatigue: Secondary | ICD-10-CM | POA: Diagnosis not present

## 2017-06-22 DIAGNOSIS — I447 Left bundle-branch block, unspecified: Secondary | ICD-10-CM | POA: Diagnosis not present

## 2017-06-22 DIAGNOSIS — T83512A Infection and inflammatory reaction due to nephrostomy catheter, initial encounter: Secondary | ICD-10-CM | POA: Diagnosis not present

## 2017-06-22 DIAGNOSIS — C689 Malignant neoplasm of urinary organ, unspecified: Secondary | ICD-10-CM | POA: Diagnosis not present

## 2017-06-22 DIAGNOSIS — I129 Hypertensive chronic kidney disease with stage 1 through stage 4 chronic kidney disease, or unspecified chronic kidney disease: Secondary | ICD-10-CM | POA: Diagnosis not present

## 2017-06-22 DIAGNOSIS — C669 Malignant neoplasm of unspecified ureter: Secondary | ICD-10-CM | POA: Diagnosis not present

## 2017-06-22 DIAGNOSIS — E785 Hyperlipidemia, unspecified: Secondary | ICD-10-CM | POA: Diagnosis not present

## 2017-06-22 DIAGNOSIS — N390 Urinary tract infection, site not specified: Secondary | ICD-10-CM | POA: Diagnosis not present

## 2017-06-22 DIAGNOSIS — Z66 Do not resuscitate: Secondary | ICD-10-CM | POA: Diagnosis not present

## 2017-06-22 DIAGNOSIS — E1122 Type 2 diabetes mellitus with diabetic chronic kidney disease: Secondary | ICD-10-CM | POA: Diagnosis not present

## 2017-06-22 DIAGNOSIS — R918 Other nonspecific abnormal finding of lung field: Secondary | ICD-10-CM | POA: Diagnosis not present

## 2017-06-22 DIAGNOSIS — J449 Chronic obstructive pulmonary disease, unspecified: Secondary | ICD-10-CM | POA: Diagnosis not present

## 2017-06-22 DIAGNOSIS — C680 Malignant neoplasm of urethra: Secondary | ICD-10-CM | POA: Diagnosis not present

## 2017-06-22 NOTE — Telephone Encounter (Signed)
06/22/17  spoke to son and he said to cx the whole week, the patient has pnemonia

## 2017-06-23 ENCOUNTER — Ambulatory Visit (HOSPITAL_COMMUNITY): Payer: Medicare Other | Admitting: Physical Therapy

## 2017-06-23 DIAGNOSIS — R310 Gross hematuria: Secondary | ICD-10-CM | POA: Diagnosis not present

## 2017-06-25 ENCOUNTER — Ambulatory Visit (HOSPITAL_COMMUNITY): Payer: Medicare Other

## 2017-06-25 MED ORDER — ALPRAZOLAM 0.5 MG PO TABS
.50 mg | ORAL_TABLET | ORAL | Status: DC
Start: ? — End: 2017-06-25

## 2017-06-25 MED ORDER — PRAVASTATIN SODIUM 40 MG PO TABS
80.00 mg | ORAL_TABLET | ORAL | Status: DC
Start: 2017-06-26 — End: 2017-06-25

## 2017-06-25 MED ORDER — POLYETHYLENE GLYCOL 3350 17 G PO PACK
17.00 g | PACK | ORAL | Status: DC
Start: ? — End: 2017-06-25

## 2017-06-25 MED ORDER — CETIRIZINE HCL 10 MG PO TABS
10.00 mg | ORAL_TABLET | ORAL | Status: DC
Start: 2017-06-26 — End: 2017-06-25

## 2017-06-25 MED ORDER — ALBUTEROL SULFATE HFA 108 (90 BASE) MCG/ACT IN AERS
2.00 | INHALATION_SPRAY | RESPIRATORY_TRACT | Status: DC
Start: ? — End: 2017-06-25

## 2017-06-25 MED ORDER — CITALOPRAM HYDROBROMIDE 20 MG PO TABS
20.00 mg | ORAL_TABLET | ORAL | Status: DC
Start: 2017-06-26 — End: 2017-06-25

## 2017-06-25 MED ORDER — CEFTRIAXONE SODIUM 1 G IJ SOLR
1.00 g | INTRAMUSCULAR | Status: DC
Start: 2017-06-26 — End: 2017-06-25

## 2017-06-25 MED ORDER — TIOTROPIUM BROMIDE MONOHYDRATE 18 MCG IN CAPS
1.00 | ORAL_CAPSULE | RESPIRATORY_TRACT | Status: DC
Start: 2017-06-26 — End: 2017-06-25

## 2017-06-25 MED ORDER — METOPROLOL TARTRATE 25 MG PO TABS
25.00 mg | ORAL_TABLET | ORAL | Status: DC
Start: 2017-06-25 — End: 2017-06-25

## 2017-06-30 ENCOUNTER — Telehealth (HOSPITAL_COMMUNITY): Payer: Self-pay | Admitting: Family Medicine

## 2017-06-30 ENCOUNTER — Ambulatory Visit (HOSPITAL_COMMUNITY): Payer: Medicare Other

## 2017-06-30 DIAGNOSIS — M2041 Other hammer toe(s) (acquired), right foot: Secondary | ICD-10-CM | POA: Diagnosis not present

## 2017-06-30 DIAGNOSIS — M2042 Other hammer toe(s) (acquired), left foot: Secondary | ICD-10-CM | POA: Diagnosis not present

## 2017-06-30 DIAGNOSIS — M21961 Unspecified acquired deformity of right lower leg: Secondary | ICD-10-CM | POA: Diagnosis not present

## 2017-06-30 DIAGNOSIS — M21962 Unspecified acquired deformity of left lower leg: Secondary | ICD-10-CM | POA: Diagnosis not present

## 2017-06-30 DIAGNOSIS — B351 Tinea unguium: Secondary | ICD-10-CM | POA: Diagnosis not present

## 2017-06-30 NOTE — Telephone Encounter (Signed)
06/30/17 cx via phone tree - weather

## 2017-07-01 DIAGNOSIS — I714 Abdominal aortic aneurysm, without rupture: Secondary | ICD-10-CM | POA: Diagnosis not present

## 2017-07-01 DIAGNOSIS — Z95828 Presence of other vascular implants and grafts: Secondary | ICD-10-CM | POA: Diagnosis not present

## 2017-07-02 ENCOUNTER — Telehealth (HOSPITAL_COMMUNITY): Payer: Self-pay

## 2017-07-02 ENCOUNTER — Ambulatory Visit (HOSPITAL_COMMUNITY): Payer: Medicare Other | Attending: Family Medicine

## 2017-07-02 NOTE — Telephone Encounter (Signed)
No show #1; attempted to call re missed appointment, but no answer and no answering machine picked up so unable to leave a message.   Geraldine Solar PT, DPT

## 2017-07-06 DIAGNOSIS — J449 Chronic obstructive pulmonary disease, unspecified: Secondary | ICD-10-CM | POA: Diagnosis not present

## 2017-07-07 ENCOUNTER — Telehealth (HOSPITAL_COMMUNITY): Payer: Self-pay | Admitting: Family Medicine

## 2017-07-07 NOTE — Telephone Encounter (Signed)
07/07/17  Pt left [place] a message to see if he had any other appts scheduled.  I called him back and talked with him and he didn't have any scheduled.  He recently got out of the hospital so he wanted to wait until after the holidays.  I scheduled him for 07/22/17 and told him when he came in we could schedule more appts.

## 2017-07-08 ENCOUNTER — Ambulatory Visit: Payer: Medicare Other | Admitting: Pulmonary Disease

## 2017-07-08 DIAGNOSIS — N184 Chronic kidney disease, stage 4 (severe): Secondary | ICD-10-CM | POA: Diagnosis not present

## 2017-07-08 DIAGNOSIS — N200 Calculus of kidney: Secondary | ICD-10-CM | POA: Diagnosis not present

## 2017-07-08 DIAGNOSIS — E559 Vitamin D deficiency, unspecified: Secondary | ICD-10-CM | POA: Diagnosis not present

## 2017-07-08 DIAGNOSIS — C689 Malignant neoplasm of urinary organ, unspecified: Secondary | ICD-10-CM | POA: Diagnosis not present

## 2017-07-08 DIAGNOSIS — N029 Recurrent and persistent hematuria with unspecified morphologic changes: Secondary | ICD-10-CM | POA: Diagnosis not present

## 2017-07-08 DIAGNOSIS — C661 Malignant neoplasm of right ureter: Secondary | ICD-10-CM | POA: Diagnosis not present

## 2017-07-08 DIAGNOSIS — Z936 Other artificial openings of urinary tract status: Secondary | ICD-10-CM | POA: Diagnosis not present

## 2017-07-08 DIAGNOSIS — I129 Hypertensive chronic kidney disease with stage 1 through stage 4 chronic kidney disease, or unspecified chronic kidney disease: Secondary | ICD-10-CM | POA: Diagnosis not present

## 2017-07-08 DIAGNOSIS — C679 Malignant neoplasm of bladder, unspecified: Secondary | ICD-10-CM | POA: Diagnosis not present

## 2017-07-08 DIAGNOSIS — L299 Pruritus, unspecified: Secondary | ICD-10-CM | POA: Diagnosis not present

## 2017-07-08 DIAGNOSIS — E1122 Type 2 diabetes mellitus with diabetic chronic kidney disease: Secondary | ICD-10-CM | POA: Diagnosis not present

## 2017-07-10 DIAGNOSIS — N184 Chronic kidney disease, stage 4 (severe): Secondary | ICD-10-CM | POA: Diagnosis not present

## 2017-07-10 DIAGNOSIS — C661 Malignant neoplasm of right ureter: Secondary | ICD-10-CM | POA: Diagnosis not present

## 2017-07-10 DIAGNOSIS — N390 Urinary tract infection, site not specified: Secondary | ICD-10-CM | POA: Diagnosis not present

## 2017-07-10 DIAGNOSIS — C670 Malignant neoplasm of trigone of bladder: Secondary | ICD-10-CM | POA: Diagnosis not present

## 2017-07-10 DIAGNOSIS — C672 Malignant neoplasm of lateral wall of bladder: Secondary | ICD-10-CM | POA: Diagnosis not present

## 2017-07-22 ENCOUNTER — Telehealth (HOSPITAL_COMMUNITY): Payer: Self-pay

## 2017-07-22 ENCOUNTER — Other Ambulatory Visit: Payer: Self-pay | Admitting: Family Medicine

## 2017-07-22 ENCOUNTER — Ambulatory Visit (HOSPITAL_COMMUNITY): Payer: Medicare Other | Attending: Family Medicine

## 2017-07-22 NOTE — Telephone Encounter (Signed)
No show #2; spoke with pt re missed appointment. He stated that he didn't know he had an appointment. Asked if he wanted to reschedule since this was his last appointment and he stated no. PT asked if he wanted to be discharged and he said yes. Educated pt that if he wanted/needed to come back that he would need a referral and he verbalized understanding.   Geraldine Solar PT, DPT

## 2017-07-31 DIAGNOSIS — G4733 Obstructive sleep apnea (adult) (pediatric): Secondary | ICD-10-CM | POA: Diagnosis not present

## 2017-08-04 ENCOUNTER — Ambulatory Visit: Payer: Medicare Other | Admitting: Family Medicine

## 2017-08-04 DIAGNOSIS — N320 Bladder-neck obstruction: Secondary | ICD-10-CM | POA: Diagnosis not present

## 2017-08-04 DIAGNOSIS — Z466 Encounter for fitting and adjustment of urinary device: Secondary | ICD-10-CM | POA: Diagnosis not present

## 2017-08-05 DIAGNOSIS — C7919 Secondary malignant neoplasm of other urinary organs: Secondary | ICD-10-CM | POA: Diagnosis not present

## 2017-08-05 DIAGNOSIS — R509 Fever, unspecified: Secondary | ICD-10-CM | POA: Diagnosis not present

## 2017-08-05 DIAGNOSIS — L299 Pruritus, unspecified: Secondary | ICD-10-CM | POA: Diagnosis not present

## 2017-08-05 DIAGNOSIS — C689 Malignant neoplasm of urinary organ, unspecified: Secondary | ICD-10-CM | POA: Diagnosis not present

## 2017-08-05 DIAGNOSIS — D696 Thrombocytopenia, unspecified: Secondary | ICD-10-CM | POA: Diagnosis not present

## 2017-08-05 DIAGNOSIS — C679 Malignant neoplasm of bladder, unspecified: Secondary | ICD-10-CM | POA: Diagnosis not present

## 2017-08-05 DIAGNOSIS — I714 Abdominal aortic aneurysm, without rupture: Secondary | ICD-10-CM | POA: Diagnosis not present

## 2017-08-05 DIAGNOSIS — J9811 Atelectasis: Secondary | ICD-10-CM | POA: Diagnosis not present

## 2017-08-05 DIAGNOSIS — Z902 Acquired absence of lung [part of]: Secondary | ICD-10-CM | POA: Diagnosis not present

## 2017-08-05 DIAGNOSIS — N028 Recurrent and persistent hematuria with other morphologic changes: Secondary | ICD-10-CM | POA: Diagnosis not present

## 2017-08-05 DIAGNOSIS — K469 Unspecified abdominal hernia without obstruction or gangrene: Secondary | ICD-10-CM | POA: Diagnosis not present

## 2017-08-05 DIAGNOSIS — R319 Hematuria, unspecified: Secondary | ICD-10-CM | POA: Diagnosis not present

## 2017-08-06 DIAGNOSIS — J449 Chronic obstructive pulmonary disease, unspecified: Secondary | ICD-10-CM | POA: Diagnosis not present

## 2017-08-10 ENCOUNTER — Other Ambulatory Visit: Payer: Self-pay | Admitting: Family Medicine

## 2017-08-11 ENCOUNTER — Ambulatory Visit (INDEPENDENT_AMBULATORY_CARE_PROVIDER_SITE_OTHER): Payer: Medicare Other | Admitting: Family Medicine

## 2017-08-11 ENCOUNTER — Encounter: Payer: Self-pay | Admitting: Family Medicine

## 2017-08-11 VITALS — BP 138/76 | Ht 72.0 in | Wt 254.0 lb

## 2017-08-11 DIAGNOSIS — G4733 Obstructive sleep apnea (adult) (pediatric): Secondary | ICD-10-CM

## 2017-08-11 DIAGNOSIS — J431 Panlobular emphysema: Secondary | ICD-10-CM | POA: Diagnosis not present

## 2017-08-11 DIAGNOSIS — E785 Hyperlipidemia, unspecified: Secondary | ICD-10-CM | POA: Diagnosis not present

## 2017-08-11 DIAGNOSIS — I10 Essential (primary) hypertension: Secondary | ICD-10-CM | POA: Diagnosis not present

## 2017-08-11 DIAGNOSIS — Z9989 Dependence on other enabling machines and devices: Secondary | ICD-10-CM

## 2017-08-11 DIAGNOSIS — E119 Type 2 diabetes mellitus without complications: Secondary | ICD-10-CM

## 2017-08-11 DIAGNOSIS — R269 Unspecified abnormalities of gait and mobility: Secondary | ICD-10-CM

## 2017-08-11 LAB — POCT GLYCOSYLATED HEMOGLOBIN (HGB A1C): Hemoglobin A1C: 5.6

## 2017-08-11 NOTE — Progress Notes (Signed)
   Subjective:    Patient ID: Kyle [name], male    DOB: [birth year], 84 y.o.   MRN: [id number] Patient arrives office with numerous concerns Diabetes  He presents for his follow-up diabetic visit. He has type 2 diabetes mellitus. Home blood sugar record trend: does not check blood sugar. He sees a podiatrist.Eye exam current: pt not sure when he last went for eye exam but states within one year.  pt arrives with daughter in law Kyle [name].  Results for orders placed or performed in visit on 08/11/17  POCT glycosylated hemoglobin (Hb A1C)  Result Value Ref Range   Hemoglobin A1C 5.6     Patient claims compliance with diabetes medication. No obvious side effects. Reports no substantial low sugar spells. Most numbers are generally in good range when checked fasting. Generally does not miss a dose of medication. Watching diabetic diet closely  Blood pressure medicine and blood pressure levels reviewed today with patient. Compliant with blood pressure medicine. States does not miss a dose. No obvious side effects. Blood pressure generally good when checked elsewhere. Watching salt intake.   We worked fairly hard on setting up physical therapy.  However due to hospitalization and patient not really wanting to do it physical therapy has been held per patient and family requests  Recently in the hospital for complicated urinary tract infection.  H&P reviewed reviewed with patient today in presence of family Pt states no concerns today.   Review of Systems No headache, no major weight loss or weight gain, no chest pain no back pain abdominal pain no change in bowel habits complete ROS otherwise negative     Objective:   Physical Exam Alert and oriented, vitals reviewed and stable, NAD ENT-TM's and ext canals WNL bilat via otoscopic exam Soft palate, tonsils and post pharynx WNL via oropharyngeal exam Neck-symmetric, no masses; thyroid nonpalpable and nontender Pulmonary-no tachypnea or accessory  muscle use; Clear without wheezes via auscultation Card--no abnrml murmurs, rhythm reg and rate WNL Carotid pulses symmetric, without bruits        Assessment & Plan:  Impression type 2 diabetes discussed good control to maintain same therapy  2.  Hypertension good control discussed to maintain same compliance discussed  3.  Hyperlipidemia.  Prior blood pressure discussed.  Maintain same discuss use of medication discussed  COPD/emphysema.  Ongoing.  Exercise encouraged within capabilities.  Hospitalization for urinary tract infection discussed weight  Greater than 50% of this 25 minute face to face visit was spent in counseling and discussion and coordination of care regarding the above diagnosis/diagnosies

## 2017-08-13 DIAGNOSIS — Z936 Other artificial openings of urinary tract status: Secondary | ICD-10-CM | POA: Diagnosis not present

## 2017-08-17 DIAGNOSIS — Z8551 Personal history of malignant neoplasm of bladder: Secondary | ICD-10-CM | POA: Diagnosis not present

## 2017-08-17 DIAGNOSIS — N184 Chronic kidney disease, stage 4 (severe): Secondary | ICD-10-CM | POA: Diagnosis not present

## 2017-08-17 DIAGNOSIS — Z8554 Personal history of malignant neoplasm of ureter: Secondary | ICD-10-CM | POA: Diagnosis not present

## 2017-08-17 DIAGNOSIS — N200 Calculus of kidney: Secondary | ICD-10-CM | POA: Diagnosis not present

## 2017-08-17 DIAGNOSIS — N281 Cyst of kidney, acquired: Secondary | ICD-10-CM | POA: Diagnosis not present

## 2017-08-26 DIAGNOSIS — L299 Pruritus, unspecified: Secondary | ICD-10-CM | POA: Diagnosis not present

## 2017-08-26 DIAGNOSIS — C3431 Malignant neoplasm of lower lobe, right bronchus or lung: Secondary | ICD-10-CM | POA: Diagnosis not present

## 2017-08-26 DIAGNOSIS — D696 Thrombocytopenia, unspecified: Secondary | ICD-10-CM | POA: Diagnosis not present

## 2017-08-26 DIAGNOSIS — C672 Malignant neoplasm of lateral wall of bladder: Secondary | ICD-10-CM | POA: Diagnosis not present

## 2017-08-26 DIAGNOSIS — Z79899 Other long term (current) drug therapy: Secondary | ICD-10-CM | POA: Diagnosis not present

## 2017-08-26 DIAGNOSIS — C661 Malignant neoplasm of right ureter: Secondary | ICD-10-CM | POA: Diagnosis not present

## 2017-08-26 DIAGNOSIS — J449 Chronic obstructive pulmonary disease, unspecified: Secondary | ICD-10-CM | POA: Diagnosis not present

## 2017-09-06 DIAGNOSIS — J449 Chronic obstructive pulmonary disease, unspecified: Secondary | ICD-10-CM | POA: Diagnosis not present

## 2017-09-16 DIAGNOSIS — Z5112 Encounter for antineoplastic immunotherapy: Secondary | ICD-10-CM | POA: Diagnosis not present

## 2017-09-16 DIAGNOSIS — N029 Recurrent and persistent hematuria with unspecified morphologic changes: Secondary | ICD-10-CM | POA: Diagnosis not present

## 2017-09-16 DIAGNOSIS — Z466 Encounter for fitting and adjustment of urinary device: Secondary | ICD-10-CM | POA: Diagnosis not present

## 2017-09-16 DIAGNOSIS — N320 Bladder-neck obstruction: Secondary | ICD-10-CM | POA: Diagnosis not present

## 2017-09-16 DIAGNOSIS — C661 Malignant neoplasm of right ureter: Secondary | ICD-10-CM | POA: Diagnosis not present

## 2017-09-16 DIAGNOSIS — Z66 Do not resuscitate: Secondary | ICD-10-CM | POA: Diagnosis not present

## 2017-09-16 DIAGNOSIS — Z436 Encounter for attention to other artificial openings of urinary tract: Secondary | ICD-10-CM | POA: Diagnosis not present

## 2017-09-16 DIAGNOSIS — D693 Immune thrombocytopenic purpura: Secondary | ICD-10-CM | POA: Diagnosis not present

## 2017-09-16 DIAGNOSIS — C679 Malignant neoplasm of bladder, unspecified: Secondary | ICD-10-CM | POA: Diagnosis not present

## 2017-09-16 DIAGNOSIS — N136 Pyonephrosis: Secondary | ICD-10-CM | POA: Diagnosis not present

## 2017-09-16 DIAGNOSIS — C680 Malignant neoplasm of urethra: Secondary | ICD-10-CM | POA: Diagnosis not present

## 2017-09-16 DIAGNOSIS — Z79899 Other long term (current) drug therapy: Secondary | ICD-10-CM | POA: Diagnosis not present

## 2017-09-16 DIAGNOSIS — L299 Pruritus, unspecified: Secondary | ICD-10-CM | POA: Diagnosis not present

## 2017-09-22 DIAGNOSIS — H0100A Unspecified blepharitis right eye, upper and lower eyelids: Secondary | ICD-10-CM | POA: Diagnosis not present

## 2017-09-22 DIAGNOSIS — H2513 Age-related nuclear cataract, bilateral: Secondary | ICD-10-CM | POA: Diagnosis not present

## 2017-09-22 DIAGNOSIS — E119 Type 2 diabetes mellitus without complications: Secondary | ICD-10-CM | POA: Diagnosis not present

## 2017-09-22 DIAGNOSIS — H5203 Hypermetropia, bilateral: Secondary | ICD-10-CM | POA: Diagnosis not present

## 2017-09-22 LAB — HM DIABETES EYE EXAM

## 2017-09-29 ENCOUNTER — Encounter: Payer: Self-pay | Admitting: Pulmonary Disease

## 2017-09-29 ENCOUNTER — Ambulatory Visit (INDEPENDENT_AMBULATORY_CARE_PROVIDER_SITE_OTHER): Payer: Medicare Other | Admitting: Pulmonary Disease

## 2017-09-29 VITALS — BP 130/74 | HR 63 | Ht 72.0 in | Wt 256.0 lb

## 2017-09-29 DIAGNOSIS — Z9989 Dependence on other enabling machines and devices: Secondary | ICD-10-CM

## 2017-09-29 DIAGNOSIS — G4733 Obstructive sleep apnea (adult) (pediatric): Secondary | ICD-10-CM

## 2017-09-29 DIAGNOSIS — J438 Other emphysema: Secondary | ICD-10-CM | POA: Diagnosis not present

## 2017-09-29 DIAGNOSIS — J449 Chronic obstructive pulmonary disease, unspecified: Secondary | ICD-10-CM

## 2017-09-29 NOTE — Patient Instructions (Signed)
COPD: Continue Spiriva daily Be sure to rinse her mouth after use the Spiriva Use albuterol as needed for chest tightness wheezing or shortness of breath  Obstructive sleep apnea: Continue CPAP nightly Let me know if you would like to undergo a CPAP titration to make sure that the pressure is titrated appropriately  Chronic respiratory failure with hypoxemia/nocturnal hypoxemia: I think it is a good idea for you to continue using 2.5 L of oxygen nightly  We will see you back in 1 year or sooner if needed

## 2017-09-29 NOTE — Progress Notes (Signed)
Subjective:    Patient ID: Kyle [name], male    DOB: 11/21/1933, 84 y.o.   MRN: [id number]  Synopsis:  Former patient of Dr. Gwenette Greet with COPD and lung cancer (Stage IA adeno, resected 2012) and urethral cancer AND Bladder cancer.    HPI Chief Complaint  Patient presents with  . Follow-up    pt c/o stable sob with exertion.     [name] thinks he is doing well.  He says that he has some fatigue sometimes, he thinks it is related to his cancer treatment including a severe rash.  He doesn't really have any problems other than this itching severe rash. This is better now.   He is still using and benefiting from his oxygen everynight. He uses 2.5L qHS.   No pneumonia or bronchitis since the last visit.    He uses his CPAP every night.  Past Medical History:  Diagnosis Date  . AAA (abdominal aortic aneurysm) ([place]) 2004   s/p repair 2004; 4.3 cm infrarenal in 05/2011  . Abnormality of gait 02/23/2013  . Adenocarcinoma of right lung ([place]) 02/24/2011   Ct A/P 2012:  2cm lung mass RLL PET 6546:  Hypermetabolic RLL mass, no other hypermetabolic areas. TTNA 02/2011:  Adenocarcinoma, markers c/w lung origin Right lower lobe superior segmentectomy. 04/01/2011 Dr. Arlyce Dice   . Adenocarcinoma, lung ([place]) 01/2011   transthoracic FNA; resection of the superior segment of the RLL in 03/2011; negative nodes; no chemotherapy nor radiation planned  . Arm fracture    right arm  . Arteriosclerotic cardiovascular disease (ASCVD) 1973, 12/2010   S/P NSTEMI secondary to distal RCA/PL lesion, tx medically.  EF of  55%-60% per  echo.  . Benign prostatic hypertrophy    s/p transurethral resection of the prostate  . Bilateral renal masses    Cystic, more prominent on CT in 12/2010 than 2007; followed by Dr. Rosana Hoes  . Bladder cancer [facility]) 1996   Transurethral resection of the bladder + chemotherapy/BCG as premed  . Cataract   . Chronic kidney disease    Creatinine 1.4 on discharge 12/20/2100; proteinuria; normal renal  ultrasound in 2010; recent creatinines of 1.7-2.; Bilateral cystic renal masses by CT in 2011  . COPD (chronic obstructive pulmonary disease) ([place])   . Coronary artery disease   . Cough    thick phlegm  . Diabetes mellitus    Type II  . Diplopia 02/23/2013  . Diverticulosis   . Essential and other specified forms of tremor 02/23/2013  . Hx of Clostridium difficile infection   . Hyperlipidemia   . Hypertension   . Insomnia   . ITP (idiopathic thrombocytopenic purpura) 09/07/2012   Chronic ITP of adults versus medication-induced ITP.  Stable  . Myocardial infarction ([place])   . Nephrolithiasis 2012   ARF in 01/2011 due to obstructing nephrolithiasis  . Obesity   . OSA (obstructive sleep apnea)   . Polyneuropathy in diabetes(357.2) 02/23/2013  . Thrombocytopenia ([place])   . Tobacco abuse    50-pack-year consumption; quit in 12/2010  . Tubular adenoma of colon   . Ventral hernia       Review of Systems  Constitutional: Negative for chills, fatigue and fever.  HENT: Positive for postnasal drip, rhinorrhea and sinus pressure.   Respiratory: Positive for cough. Negative for shortness of breath and wheezing.   Cardiovascular: Negative for chest pain and leg swelling.       Objective:   Physical Exam Vitals:   09/29/17 1539  BP: 130/74  Pulse: 63  SpO2: 97%  Weight: 256 lb (116.1 kg)  Height: 6' (1.829 m)    Gen: obese, chronically ill appearing HENT: OP clear, TM's clear, neck supple PULM: CTA B, normal percussion CV: RRR, no mgr, trace edema GI: BS+, soft, nontender Derm: no cyanosis or rash Psyche: normal mood and affect     CBC    Component Value Date/Time   WBC 5.8 12/29/2016 0813   RBC 4.15 (L) 12/29/2016 0813   HGB 12.6 (L) 12/29/2016 0813   HCT 39.0 12/29/2016 0813   PLT 93 (L) 12/29/2016 0813   MCV 94.0 12/29/2016 0813   MCH 30.4 12/29/2016 0813   MCHC 32.3 12/29/2016 0813   RDW 16.9 (H) 12/29/2016 0813   LYMPHSABS 1.4 12/29/2016 0813   MONOABS 0.6  12/29/2016 0813   EOSABS 0.4 12/29/2016 0813   BASOSABS 0.0 12/29/2016 0813   PFT PFT's 02/2011:  FEV1 2.40 (83%), ratio 60, nl TLC, DLCO 59%; treated with nocturnal oxygen.     Assessment & Plan:   Chronic obstructive pulmonary disease, unspecified COPD type ([place])  Obstructive sleep apnea on CPAP  Other emphysema ([place])  Discussion: I worry that his obstructive sleep apnea may be undertreated as it is been many years since his last CPAP titration study and he reports significant daytime somnolence.  However, he attributes his fatigue and daytime somnolence to the immunotherapy for his cancer.  We talked at length today about repeating a CPAP titration study but he would prefer to not do this right now.  In general his COPD seems stable.  Plan: COPD: Continue Spiriva daily Be sure to rinse her mouth after use the Spiriva Use albuterol as needed for chest tightness wheezing or shortness of breath  Obstructive sleep apnea: Continue CPAP nightly Let me know if you would like to undergo a CPAP titration to make sure that the pressure is titrated appropriately  Chronic respiratory failure with hypoxemia/nocturnal hypoxemia: I think it is a good idea for you to continue using 2.5 L of oxygen nightly  We will see you back in 1 year or sooner if needed    Current Outpatient Medications:  .  acetaminophen (TYLENOL) 500 MG tablet, Take 500-1,000 mg by mouth at bedtime., Disp: , Rfl:  .  albuterol (PROAIR HFA) 108 (90 BASE) MCG/ACT inhaler, Inhale 2 puffs into the lungs every 6 (six) hours as needed for wheezing., Disp: 1 Inhaler, Rfl: 5 .  ALPRAZolam (XANAX) 0.5 MG tablet, TAKE ONE TABLET TWICE DAILY AS NEEDED FOR ANXIETY, Disp: 60 tablet, Rfl: 5 .  aspirin EC 81 MG tablet, Take 81 mg by mouth daily., Disp: , Rfl:  .  cetirizine (ZYRTEC) 10 MG tablet, Take 1 tablet (10 mg total) by mouth daily., Disp: 30 tablet, Rfl: 5 .  cholecalciferol (VITAMIN D) 1000 units tablet, Take 2,000 Units  by mouth daily., Disp: , Rfl:  .  citalopram (CELEXA) 20 MG tablet, TAKE ONE (1) TABLET BY MOUTH EVERY DAY, Disp: 90 tablet, Rfl: 3 .  fluticasone (FLONASE) 50 MCG/ACT nasal spray, Place 1 spray into both nostrils daily., Disp: 16 g, Rfl: 5 .  metoprolol tartrate (LOPRESSOR) 50 MG tablet, TAKE 1/2 TABLET BY MOUTH TWICE DAILY, Disp: 90 tablet, Rfl: 0 .  niacin (NIASPAN) 500 MG CR tablet, Take 2 tablets (1,000 mg total) by mouth at bedtime., Disp: 60 tablet, Rfl: 5 .  nitroGLYCERIN (NITROSTAT) 0.4 MG SL tablet, Place 1 tablet (0.4 mg total) under the tongue every 5 (five) minutes as  needed. Call MD if need more than 2 (Patient taking differently: Place 0.4 mg under the tongue every 5 (five) minutes as needed for chest pain. Call MD if need more than 2), Disp: 3 tablet, Rfl: 0 .  Pembrolizumab (KEYTRUDA IV), Inject into the vein every 28 (twenty-eight) days. At [facility] , Disp: , Rfl:  .  pravastatin (PRAVACHOL) 80 MG tablet, TAKE ONE (1) TABLET EACH DAY, Disp: 90 tablet, Rfl: 1 .  Probiotic Product (PROBIOTIC PEARLS ADVANTAGE) CAPS, Take 1 capsule by mouth daily., Disp: , Rfl:  .  tiotropium (SPIRIVA) 18 MCG inhalation capsule, Place 18 mcg into inhaler and inhale daily., Disp: , Rfl:  .  vitamin B-12 1000 MCG tablet, Take 1 tablet (1,000 mcg total) by mouth daily., Disp: , Rfl:

## 2017-10-04 DIAGNOSIS — J449 Chronic obstructive pulmonary disease, unspecified: Secondary | ICD-10-CM | POA: Diagnosis not present

## 2017-10-05 DIAGNOSIS — M25561 Pain in right knee: Secondary | ICD-10-CM | POA: Diagnosis not present

## 2017-10-05 DIAGNOSIS — M25562 Pain in left knee: Secondary | ICD-10-CM | POA: Diagnosis not present

## 2017-10-06 DIAGNOSIS — Z9181 History of falling: Secondary | ICD-10-CM | POA: Diagnosis not present

## 2017-10-06 DIAGNOSIS — J449 Chronic obstructive pulmonary disease, unspecified: Secondary | ICD-10-CM | POA: Diagnosis not present

## 2017-10-06 DIAGNOSIS — M170 Bilateral primary osteoarthritis of knee: Secondary | ICD-10-CM | POA: Diagnosis not present

## 2017-10-07 DIAGNOSIS — E1122 Type 2 diabetes mellitus with diabetic chronic kidney disease: Secondary | ICD-10-CM | POA: Diagnosis not present

## 2017-10-07 DIAGNOSIS — J449 Chronic obstructive pulmonary disease, unspecified: Secondary | ICD-10-CM | POA: Diagnosis not present

## 2017-10-07 DIAGNOSIS — I129 Hypertensive chronic kidney disease with stage 1 through stage 4 chronic kidney disease, or unspecified chronic kidney disease: Secondary | ICD-10-CM | POA: Diagnosis not present

## 2017-10-07 DIAGNOSIS — C679 Malignant neoplasm of bladder, unspecified: Secondary | ICD-10-CM | POA: Diagnosis not present

## 2017-10-07 DIAGNOSIS — R600 Localized edema: Secondary | ICD-10-CM | POA: Diagnosis not present

## 2017-10-07 DIAGNOSIS — N184 Chronic kidney disease, stage 4 (severe): Secondary | ICD-10-CM | POA: Diagnosis not present

## 2017-10-07 DIAGNOSIS — E785 Hyperlipidemia, unspecified: Secondary | ICD-10-CM | POA: Diagnosis not present

## 2017-10-07 DIAGNOSIS — Z87442 Personal history of urinary calculi: Secondary | ICD-10-CM | POA: Diagnosis not present

## 2017-10-07 DIAGNOSIS — E119 Type 2 diabetes mellitus without complications: Secondary | ICD-10-CM | POA: Diagnosis not present

## 2017-10-07 DIAGNOSIS — D631 Anemia in chronic kidney disease: Secondary | ICD-10-CM | POA: Diagnosis not present

## 2017-10-07 DIAGNOSIS — Z95828 Presence of other vascular implants and grafts: Secondary | ICD-10-CM | POA: Diagnosis not present

## 2017-10-07 DIAGNOSIS — Z85118 Personal history of other malignant neoplasm of bronchus and lung: Secondary | ICD-10-CM | POA: Diagnosis not present

## 2017-10-07 DIAGNOSIS — G4733 Obstructive sleep apnea (adult) (pediatric): Secondary | ICD-10-CM | POA: Diagnosis not present

## 2017-10-08 ENCOUNTER — Other Ambulatory Visit: Payer: Self-pay | Admitting: Family Medicine

## 2017-10-08 DIAGNOSIS — Z9181 History of falling: Secondary | ICD-10-CM | POA: Diagnosis not present

## 2017-10-08 DIAGNOSIS — M170 Bilateral primary osteoarthritis of knee: Secondary | ICD-10-CM | POA: Diagnosis not present

## 2017-10-08 DIAGNOSIS — J449 Chronic obstructive pulmonary disease, unspecified: Secondary | ICD-10-CM | POA: Diagnosis not present

## 2017-10-09 DIAGNOSIS — Z9181 History of falling: Secondary | ICD-10-CM | POA: Diagnosis not present

## 2017-10-09 DIAGNOSIS — M170 Bilateral primary osteoarthritis of knee: Secondary | ICD-10-CM | POA: Diagnosis not present

## 2017-10-09 DIAGNOSIS — J449 Chronic obstructive pulmonary disease, unspecified: Secondary | ICD-10-CM | POA: Diagnosis not present

## 2017-10-12 DIAGNOSIS — M25561 Pain in right knee: Secondary | ICD-10-CM | POA: Diagnosis not present

## 2017-10-12 DIAGNOSIS — M25562 Pain in left knee: Secondary | ICD-10-CM | POA: Diagnosis not present

## 2017-10-12 DIAGNOSIS — M170 Bilateral primary osteoarthritis of knee: Secondary | ICD-10-CM | POA: Diagnosis not present

## 2017-10-12 DIAGNOSIS — J449 Chronic obstructive pulmonary disease, unspecified: Secondary | ICD-10-CM | POA: Diagnosis not present

## 2017-10-12 DIAGNOSIS — Z9181 History of falling: Secondary | ICD-10-CM | POA: Diagnosis not present

## 2017-10-13 DIAGNOSIS — J449 Chronic obstructive pulmonary disease, unspecified: Secondary | ICD-10-CM | POA: Diagnosis not present

## 2017-10-13 DIAGNOSIS — M170 Bilateral primary osteoarthritis of knee: Secondary | ICD-10-CM | POA: Diagnosis not present

## 2017-10-13 DIAGNOSIS — Z9181 History of falling: Secondary | ICD-10-CM | POA: Diagnosis not present

## 2017-10-14 DIAGNOSIS — L299 Pruritus, unspecified: Secondary | ICD-10-CM | POA: Diagnosis not present

## 2017-10-14 DIAGNOSIS — C3490 Malignant neoplasm of unspecified part of unspecified bronchus or lung: Secondary | ICD-10-CM | POA: Diagnosis not present

## 2017-10-14 DIAGNOSIS — C669 Malignant neoplasm of unspecified ureter: Secondary | ICD-10-CM | POA: Diagnosis not present

## 2017-10-14 DIAGNOSIS — C679 Malignant neoplasm of bladder, unspecified: Secondary | ICD-10-CM | POA: Diagnosis not present

## 2017-10-14 DIAGNOSIS — R319 Hematuria, unspecified: Secondary | ICD-10-CM | POA: Diagnosis not present

## 2017-10-14 DIAGNOSIS — D696 Thrombocytopenia, unspecified: Secondary | ICD-10-CM | POA: Diagnosis not present

## 2017-10-14 DIAGNOSIS — C661 Malignant neoplasm of right ureter: Secondary | ICD-10-CM | POA: Diagnosis not present

## 2017-10-14 DIAGNOSIS — Z978 Presence of other specified devices: Secondary | ICD-10-CM | POA: Diagnosis not present

## 2017-10-15 DIAGNOSIS — B351 Tinea unguium: Secondary | ICD-10-CM | POA: Diagnosis not present

## 2017-10-15 DIAGNOSIS — Z9181 History of falling: Secondary | ICD-10-CM | POA: Diagnosis not present

## 2017-10-15 DIAGNOSIS — M170 Bilateral primary osteoarthritis of knee: Secondary | ICD-10-CM | POA: Diagnosis not present

## 2017-10-15 DIAGNOSIS — E1151 Type 2 diabetes mellitus with diabetic peripheral angiopathy without gangrene: Secondary | ICD-10-CM | POA: Diagnosis not present

## 2017-10-15 DIAGNOSIS — J449 Chronic obstructive pulmonary disease, unspecified: Secondary | ICD-10-CM | POA: Diagnosis not present

## 2017-10-19 ENCOUNTER — Other Ambulatory Visit: Payer: Self-pay | Admitting: Pulmonary Disease

## 2017-10-19 DIAGNOSIS — J449 Chronic obstructive pulmonary disease, unspecified: Secondary | ICD-10-CM | POA: Diagnosis not present

## 2017-10-19 DIAGNOSIS — Z466 Encounter for fitting and adjustment of urinary device: Secondary | ICD-10-CM | POA: Diagnosis not present

## 2017-10-19 DIAGNOSIS — M170 Bilateral primary osteoarthritis of knee: Secondary | ICD-10-CM | POA: Diagnosis not present

## 2017-10-19 DIAGNOSIS — Z436 Encounter for attention to other artificial openings of urinary tract: Secondary | ICD-10-CM | POA: Diagnosis not present

## 2017-10-19 DIAGNOSIS — C679 Malignant neoplasm of bladder, unspecified: Secondary | ICD-10-CM | POA: Diagnosis not present

## 2017-10-19 DIAGNOSIS — Z9181 History of falling: Secondary | ICD-10-CM | POA: Diagnosis not present

## 2017-10-20 ENCOUNTER — Telehealth: Payer: Self-pay | Admitting: Family Medicine

## 2017-10-20 ENCOUNTER — Other Ambulatory Visit: Payer: Self-pay | Admitting: Family Medicine

## 2017-10-20 NOTE — Telephone Encounter (Signed)
Contacted Melissa and gave verbal order for home health to check catheter.

## 2017-10-20 NOTE — Telephone Encounter (Signed)
Please give an order for home health to go check his catheter

## 2017-10-20 NOTE — Telephone Encounter (Signed)
Melissa with [facility] said that the OT went to visit Kyle [name] yesterday and his family had concerns about his foley catheter.  They are requesting a nurse to come out and assess his catheter.  She is requesting orders for this.

## 2017-10-21 DIAGNOSIS — Z9181 History of falling: Secondary | ICD-10-CM | POA: Diagnosis not present

## 2017-10-21 DIAGNOSIS — J449 Chronic obstructive pulmonary disease, unspecified: Secondary | ICD-10-CM | POA: Diagnosis not present

## 2017-10-21 DIAGNOSIS — M170 Bilateral primary osteoarthritis of knee: Secondary | ICD-10-CM | POA: Diagnosis not present

## 2017-10-22 ENCOUNTER — Telehealth: Payer: Self-pay | Admitting: Family Medicine

## 2017-10-22 DIAGNOSIS — Z9181 History of falling: Secondary | ICD-10-CM | POA: Diagnosis not present

## 2017-10-22 DIAGNOSIS — M170 Bilateral primary osteoarthritis of knee: Secondary | ICD-10-CM | POA: Diagnosis not present

## 2017-10-22 DIAGNOSIS — J449 Chronic obstructive pulmonary disease, unspecified: Secondary | ICD-10-CM | POA: Diagnosis not present

## 2017-10-22 NOTE — Telephone Encounter (Signed)
Script written out; awaiting signature 

## 2017-10-22 NOTE — Telephone Encounter (Signed)
The OT from [place] just had his visit with Jori Moll.  He asked Dominick's wife to call our office and request an order to be sent to [place] for a bariatric bedside commode.

## 2017-10-22 NOTE — Telephone Encounter (Signed)
Please go ahead and give an order regarding this

## 2017-10-23 DIAGNOSIS — J449 Chronic obstructive pulmonary disease, unspecified: Secondary | ICD-10-CM | POA: Diagnosis not present

## 2017-10-23 DIAGNOSIS — Z9181 History of falling: Secondary | ICD-10-CM | POA: Diagnosis not present

## 2017-10-23 DIAGNOSIS — M170 Bilateral primary osteoarthritis of knee: Secondary | ICD-10-CM | POA: Diagnosis not present

## 2017-10-23 NOTE — Telephone Encounter (Signed)
Script faxed and pt's wife notified

## 2017-10-26 DIAGNOSIS — M170 Bilateral primary osteoarthritis of knee: Secondary | ICD-10-CM | POA: Diagnosis not present

## 2017-10-26 DIAGNOSIS — J449 Chronic obstructive pulmonary disease, unspecified: Secondary | ICD-10-CM | POA: Diagnosis not present

## 2017-10-26 DIAGNOSIS — Z9181 History of falling: Secondary | ICD-10-CM | POA: Diagnosis not present

## 2017-10-27 ENCOUNTER — Telehealth: Payer: Self-pay | Admitting: Family Medicine

## 2017-10-27 DIAGNOSIS — M170 Bilateral primary osteoarthritis of knee: Secondary | ICD-10-CM | POA: Diagnosis not present

## 2017-10-27 DIAGNOSIS — J449 Chronic obstructive pulmonary disease, unspecified: Secondary | ICD-10-CM | POA: Diagnosis not present

## 2017-10-27 DIAGNOSIS — Z9181 History of falling: Secondary | ICD-10-CM | POA: Diagnosis not present

## 2017-10-27 NOTE — Telephone Encounter (Signed)
Kyle [name] returned call; informed that the change was OK. Verbalized understanding

## 2017-10-27 NOTE — Telephone Encounter (Signed)
ok 

## 2017-10-27 NOTE — Telephone Encounter (Signed)
Left message to return call 

## 2017-10-27 NOTE — Telephone Encounter (Signed)
Pam with [facility] called to request "OK" for dressing change plan of care  They'd like to go out 2x week for 6 weeks for his nephrostomy tube dressing change  Please call Rosalia Hammers to give verbal "OK" [phone]

## 2017-10-28 DIAGNOSIS — I10 Essential (primary) hypertension: Secondary | ICD-10-CM | POA: Diagnosis not present

## 2017-10-28 DIAGNOSIS — Z9181 History of falling: Secondary | ICD-10-CM | POA: Diagnosis not present

## 2017-10-28 DIAGNOSIS — N2581 Secondary hyperparathyroidism of renal origin: Secondary | ICD-10-CM | POA: Diagnosis not present

## 2017-10-28 DIAGNOSIS — N184 Chronic kidney disease, stage 4 (severe): Secondary | ICD-10-CM | POA: Diagnosis not present

## 2017-10-28 DIAGNOSIS — J449 Chronic obstructive pulmonary disease, unspecified: Secondary | ICD-10-CM | POA: Diagnosis not present

## 2017-10-28 DIAGNOSIS — M170 Bilateral primary osteoarthritis of knee: Secondary | ICD-10-CM | POA: Diagnosis not present

## 2017-10-29 DIAGNOSIS — Z9181 History of falling: Secondary | ICD-10-CM | POA: Diagnosis not present

## 2017-10-29 DIAGNOSIS — J449 Chronic obstructive pulmonary disease, unspecified: Secondary | ICD-10-CM | POA: Diagnosis not present

## 2017-10-29 DIAGNOSIS — M170 Bilateral primary osteoarthritis of knee: Secondary | ICD-10-CM | POA: Diagnosis not present

## 2017-10-30 DIAGNOSIS — M170 Bilateral primary osteoarthritis of knee: Secondary | ICD-10-CM | POA: Diagnosis not present

## 2017-10-30 DIAGNOSIS — Z9181 History of falling: Secondary | ICD-10-CM | POA: Diagnosis not present

## 2017-10-30 DIAGNOSIS — J449 Chronic obstructive pulmonary disease, unspecified: Secondary | ICD-10-CM | POA: Diagnosis not present

## 2017-10-31 DIAGNOSIS — Z885 Allergy status to narcotic agent status: Secondary | ICD-10-CM | POA: Diagnosis not present

## 2017-10-31 DIAGNOSIS — Y998 Other external cause status: Secondary | ICD-10-CM | POA: Diagnosis not present

## 2017-10-31 DIAGNOSIS — Z886 Allergy status to analgesic agent status: Secondary | ICD-10-CM | POA: Diagnosis not present

## 2017-10-31 DIAGNOSIS — T83022A Displacement of nephrostomy catheter, initial encounter: Secondary | ICD-10-CM | POA: Diagnosis not present

## 2017-10-31 DIAGNOSIS — X58XXXA Exposure to other specified factors, initial encounter: Secondary | ICD-10-CM | POA: Diagnosis not present

## 2017-11-03 DIAGNOSIS — J449 Chronic obstructive pulmonary disease, unspecified: Secondary | ICD-10-CM | POA: Diagnosis not present

## 2017-11-03 DIAGNOSIS — Z9181 History of falling: Secondary | ICD-10-CM | POA: Diagnosis not present

## 2017-11-03 DIAGNOSIS — M170 Bilateral primary osteoarthritis of knee: Secondary | ICD-10-CM | POA: Diagnosis not present

## 2017-11-04 DIAGNOSIS — M170 Bilateral primary osteoarthritis of knee: Secondary | ICD-10-CM | POA: Diagnosis not present

## 2017-11-04 DIAGNOSIS — Z9181 History of falling: Secondary | ICD-10-CM | POA: Diagnosis not present

## 2017-11-04 DIAGNOSIS — J449 Chronic obstructive pulmonary disease, unspecified: Secondary | ICD-10-CM | POA: Diagnosis not present

## 2017-11-05 DIAGNOSIS — Z9181 History of falling: Secondary | ICD-10-CM | POA: Diagnosis not present

## 2017-11-05 DIAGNOSIS — J449 Chronic obstructive pulmonary disease, unspecified: Secondary | ICD-10-CM | POA: Diagnosis not present

## 2017-11-05 DIAGNOSIS — M170 Bilateral primary osteoarthritis of knee: Secondary | ICD-10-CM | POA: Diagnosis not present

## 2017-11-06 DIAGNOSIS — M170 Bilateral primary osteoarthritis of knee: Secondary | ICD-10-CM | POA: Diagnosis not present

## 2017-11-06 DIAGNOSIS — Z9181 History of falling: Secondary | ICD-10-CM | POA: Diagnosis not present

## 2017-11-06 DIAGNOSIS — J449 Chronic obstructive pulmonary disease, unspecified: Secondary | ICD-10-CM | POA: Diagnosis not present

## 2017-11-09 ENCOUNTER — Encounter: Payer: Self-pay | Admitting: Family Medicine

## 2017-11-09 ENCOUNTER — Ambulatory Visit (INDEPENDENT_AMBULATORY_CARE_PROVIDER_SITE_OTHER): Payer: Medicare Other | Admitting: Family Medicine

## 2017-11-09 VITALS — BP 128/84 | Ht 72.0 in | Wt 245.0 lb

## 2017-11-09 DIAGNOSIS — E785 Hyperlipidemia, unspecified: Secondary | ICD-10-CM | POA: Diagnosis not present

## 2017-11-09 DIAGNOSIS — J449 Chronic obstructive pulmonary disease, unspecified: Secondary | ICD-10-CM | POA: Diagnosis not present

## 2017-11-09 DIAGNOSIS — E119 Type 2 diabetes mellitus without complications: Secondary | ICD-10-CM

## 2017-11-09 DIAGNOSIS — I10 Essential (primary) hypertension: Secondary | ICD-10-CM

## 2017-11-09 DIAGNOSIS — Z9181 History of falling: Secondary | ICD-10-CM | POA: Diagnosis not present

## 2017-11-09 DIAGNOSIS — M170 Bilateral primary osteoarthritis of knee: Secondary | ICD-10-CM | POA: Diagnosis not present

## 2017-11-09 DIAGNOSIS — Z79899 Other long term (current) drug therapy: Secondary | ICD-10-CM

## 2017-11-09 LAB — POCT GLYCOSYLATED HEMOGLOBIN (HGB A1C): Hemoglobin A1C: 5.3

## 2017-11-09 MED ORDER — CITALOPRAM HYDROBROMIDE 20 MG PO TABS: ORAL_TABLET | ORAL | 1 refills | Status: DC

## 2017-11-09 MED ORDER — METOPROLOL TARTRATE 50 MG PO TABS: ORAL_TABLET | ORAL | 1 refills | Status: DC

## 2017-11-09 MED ORDER — FLUTICASONE PROPIONATE 50 MCG/ACT NA SUSP: 1.0000 | Freq: Every day | NASAL | 5 refills | Status: AC

## 2017-11-09 MED ORDER — PRAVASTATIN SODIUM 80 MG PO TABS: ORAL_TABLET | ORAL | 1 refills | Status: DC

## 2017-11-09 NOTE — Progress Notes (Signed)
   Subjective:    Patient ID: Kyle [name], male    DOB: December 28, 1933, 84 y.o.   MRN: [id number]  Diabetes  He presents for his follow-up diabetic visit. He has type 2 diabetes mellitus. Risk factors for coronary artery disease include diabetes mellitus, hypertension and dyslipidemia. Current diabetic treatment includes diet. His weight is stable. He is following a diabetic diet.   Results for orders placed or performed in visit on 11/09/17  POCT glycosylated hemoglobin (Hb A1C)  Result Value Ref Range   Hemoglobin A1C 5.3    Sugars overall running pretty good  Has a patch on back around neph tube they would like it checked  Blood pressure medicine and blood pressure levels reviewed today with patient. Compliant with blood pressure medicine. States does not miss a dose. No obvious side effects. Blood pressure generally good when checked elsewhere. Watching salt intake.   Patient continues to take lipid medication regularly. No obvious side effects from it. Generally does not miss a dose. Prior blood work results are reviewed with patient. Patient continues to work on fat intake in diet  Breathing has not been the best       Diet pretty poor per tp Review of Systems No headache, no major weight loss or weight gain, no chest pain no back pain abdominal pain no change in bowel habits complete ROS otherwise negative     Objective:   Physical Exam Alert and oriented, vitals reviewed and stable, NAD ENT-TM's and ext canals WNL bilat via otoscopic exam Soft palate, tonsils and post pharynx WNL via oropharyngeal exam Neck-symmetric, no masses; thyroid nonpalpable and nontender Pulmonary-no tachypnea or accessory muscle use; Clear without wheezes via auscultation Card--no abnrml murmurs, rhythm reg and rate WNL Carotid pulses symmetric, without bruits       Assessment & Plan:  Impression1 type 2 diabetes.  A1c type.  Discussed.  To maintain same approach  2.  Hypertension good  control blood pressure you to maintain  3.  COPD.  Unfortunate ongoing shortness of breath.  Maintain Spiriva.  Rationale discussed.  Gentle exercise encouraged.  4.  Hyperlipidemia.  Current status uncertain.  Check blood work.  Maintain current meds pending these results.  Further recommendations based on results.  Follow-up as scheduled./Diet exercise discussed  Greater than 50% of this 25 minute face to face visit was spent in counseling and discussion and coordination of care regarding the above diagnosis/diagnosies

## 2017-11-10 DIAGNOSIS — M170 Bilateral primary osteoarthritis of knee: Secondary | ICD-10-CM | POA: Diagnosis not present

## 2017-11-10 DIAGNOSIS — Z9181 History of falling: Secondary | ICD-10-CM | POA: Diagnosis not present

## 2017-11-10 DIAGNOSIS — J449 Chronic obstructive pulmonary disease, unspecified: Secondary | ICD-10-CM | POA: Diagnosis not present

## 2017-11-10 LAB — BASIC METABOLIC PANEL
BUN / CREAT RATIO: 11 (ref 10–24)
BUN: 32 mg/dL — AB (ref 8–27)
CHLORIDE: 109 mmol/L — AB (ref 96–106)
CO2: 20 mmol/L (ref 20–29)
Calcium: 9.5 mg/dL (ref 8.6–10.2)
Creatinine, Ser: 3.03 mg/dL — ABNORMAL HIGH (ref 0.76–1.27)
GFR, EST AFRICAN AMERICAN: 21 mL/min/{1.73_m2} — AB (ref >59)
GFR, EST NON AFRICAN AMERICAN: 18 mL/min/{1.73_m2} — AB (ref >59)
Glucose: 132 mg/dL — ABNORMAL HIGH (ref 65–99)
Potassium: 4.1 mmol/L (ref 3.5–5.2)
Sodium: 145 mmol/L — ABNORMAL HIGH (ref 134–144)

## 2017-11-10 LAB — HEPATIC FUNCTION PANEL
ALBUMIN: 3.9 g/dL (ref 3.5–4.7)
ALK PHOS: 64 IU/L (ref 39–117)
ALT: 8 IU/L (ref 0–44)
AST: 7 IU/L (ref 0–40)
BILIRUBIN, DIRECT: 0.32 mg/dL (ref 0.00–0.40)
Bilirubin Total: 1.2 mg/dL (ref 0.0–1.2)
TOTAL PROTEIN: 6.8 g/dL (ref 6.0–8.5)

## 2017-11-10 LAB — LIPID PANEL
Chol/HDL Ratio: 3.2 ratio (ref 0.0–5.0)
Cholesterol, Total: 119 mg/dL (ref 100–199)
HDL: 37 mg/dL — ABNORMAL LOW (ref >39)
LDL Calculated: 61 mg/dL (ref 0–99)
Triglycerides: 106 mg/dL (ref 0–149)
VLDL Cholesterol Cal: 21 mg/dL (ref 5–40)

## 2017-11-11 DIAGNOSIS — C679 Malignant neoplasm of bladder, unspecified: Secondary | ICD-10-CM | POA: Diagnosis not present

## 2017-11-11 DIAGNOSIS — K439 Ventral hernia without obstruction or gangrene: Secondary | ICD-10-CM | POA: Diagnosis not present

## 2017-11-11 DIAGNOSIS — C672 Malignant neoplasm of lateral wall of bladder: Secondary | ICD-10-CM | POA: Diagnosis not present

## 2017-11-11 DIAGNOSIS — I714 Abdominal aortic aneurysm, without rupture: Secondary | ICD-10-CM | POA: Diagnosis not present

## 2017-11-11 DIAGNOSIS — I724 Aneurysm of artery of lower extremity: Secondary | ICD-10-CM | POA: Diagnosis not present

## 2017-11-11 DIAGNOSIS — L299 Pruritus, unspecified: Secondary | ICD-10-CM | POA: Diagnosis not present

## 2017-11-11 DIAGNOSIS — Z936 Other artificial openings of urinary tract status: Secondary | ICD-10-CM | POA: Diagnosis not present

## 2017-11-11 DIAGNOSIS — C661 Malignant neoplasm of right ureter: Secondary | ICD-10-CM | POA: Diagnosis not present

## 2017-11-11 DIAGNOSIS — C689 Malignant neoplasm of urinary organ, unspecified: Secondary | ICD-10-CM | POA: Diagnosis not present

## 2017-11-12 DIAGNOSIS — J449 Chronic obstructive pulmonary disease, unspecified: Secondary | ICD-10-CM | POA: Diagnosis not present

## 2017-11-12 DIAGNOSIS — Z9181 History of falling: Secondary | ICD-10-CM | POA: Diagnosis not present

## 2017-11-12 DIAGNOSIS — M170 Bilateral primary osteoarthritis of knee: Secondary | ICD-10-CM | POA: Diagnosis not present

## 2017-11-13 ENCOUNTER — Emergency Department (HOSPITAL_COMMUNITY)
Admission: EM | Admit: 2017-11-13 | Discharge: 2017-11-13 | Disposition: A | Discharge status: Home / Self Care | Payer: Medicare Other | Attending: Emergency Medicine | Admitting: Emergency Medicine

## 2017-11-13 ENCOUNTER — Encounter (HOSPITAL_COMMUNITY): Payer: Self-pay | Admitting: *Deleted

## 2017-11-13 ENCOUNTER — Telehealth: Payer: Self-pay | Admitting: Family Medicine

## 2017-11-13 ENCOUNTER — Emergency Department (HOSPITAL_COMMUNITY): Payer: Medicare Other

## 2017-11-13 ENCOUNTER — Other Ambulatory Visit: Payer: Self-pay

## 2017-11-13 DIAGNOSIS — S0011XA Contusion of right eyelid and periocular area, initial encounter: Secondary | ICD-10-CM | POA: Diagnosis not present

## 2017-11-13 DIAGNOSIS — N184 Chronic kidney disease, stage 4 (severe): Secondary | ICD-10-CM | POA: Insufficient documentation

## 2017-11-13 DIAGNOSIS — Z79899 Other long term (current) drug therapy: Secondary | ICD-10-CM | POA: Diagnosis not present

## 2017-11-13 DIAGNOSIS — S199XXA Unspecified injury of neck, initial encounter: Secondary | ICD-10-CM | POA: Diagnosis not present

## 2017-11-13 DIAGNOSIS — Y929 Unspecified place or not applicable: Secondary | ICD-10-CM | POA: Diagnosis not present

## 2017-11-13 DIAGNOSIS — Y9389 Activity, other specified: Secondary | ICD-10-CM | POA: Insufficient documentation

## 2017-11-13 DIAGNOSIS — Y999 Unspecified external cause status: Secondary | ICD-10-CM | POA: Insufficient documentation

## 2017-11-13 DIAGNOSIS — S0081XA Abrasion of other part of head, initial encounter: Secondary | ICD-10-CM | POA: Diagnosis not present

## 2017-11-13 DIAGNOSIS — S0990XA Unspecified injury of head, initial encounter: Secondary | ICD-10-CM | POA: Diagnosis not present

## 2017-11-13 DIAGNOSIS — I2510 Atherosclerotic heart disease of native coronary artery without angina pectoris: Secondary | ICD-10-CM | POA: Diagnosis not present

## 2017-11-13 DIAGNOSIS — S0083XA Contusion of other part of head, initial encounter: Secondary | ICD-10-CM | POA: Insufficient documentation

## 2017-11-13 DIAGNOSIS — E1122 Type 2 diabetes mellitus with diabetic chronic kidney disease: Secondary | ICD-10-CM | POA: Diagnosis not present

## 2017-11-13 DIAGNOSIS — M170 Bilateral primary osteoarthritis of knee: Secondary | ICD-10-CM | POA: Diagnosis not present

## 2017-11-13 DIAGNOSIS — S0010XA Contusion of unspecified eyelid and periocular area, initial encounter: Secondary | ICD-10-CM

## 2017-11-13 DIAGNOSIS — F17210 Nicotine dependence, cigarettes, uncomplicated: Secondary | ICD-10-CM | POA: Diagnosis not present

## 2017-11-13 DIAGNOSIS — T148XXA Other injury of unspecified body region, initial encounter: Secondary | ICD-10-CM

## 2017-11-13 DIAGNOSIS — I252 Old myocardial infarction: Secondary | ICD-10-CM | POA: Insufficient documentation

## 2017-11-13 DIAGNOSIS — S0012XA Contusion of left eyelid and periocular area, initial encounter: Secondary | ICD-10-CM | POA: Diagnosis not present

## 2017-11-13 DIAGNOSIS — S00211A Abrasion of right eyelid and periocular area, initial encounter: Secondary | ICD-10-CM | POA: Diagnosis not present

## 2017-11-13 DIAGNOSIS — Z7982 Long term (current) use of aspirin: Secondary | ICD-10-CM | POA: Diagnosis not present

## 2017-11-13 DIAGNOSIS — Z9181 History of falling: Secondary | ICD-10-CM | POA: Diagnosis not present

## 2017-11-13 DIAGNOSIS — J449 Chronic obstructive pulmonary disease, unspecified: Secondary | ICD-10-CM | POA: Diagnosis not present

## 2017-11-13 DIAGNOSIS — Z8551 Personal history of malignant neoplasm of bladder: Secondary | ICD-10-CM | POA: Diagnosis not present

## 2017-11-13 DIAGNOSIS — I129 Hypertensive chronic kidney disease with stage 1 through stage 4 chronic kidney disease, or unspecified chronic kidney disease: Secondary | ICD-10-CM | POA: Insufficient documentation

## 2017-11-13 DIAGNOSIS — W19XXXA Unspecified fall, initial encounter: Secondary | ICD-10-CM

## 2017-11-13 NOTE — ED Triage Notes (Signed)
Pt in c/o falling off a cart today, pt has swelling to R face and lac to L lower leg, bleeding controlled, L fac lac to R eyebrow lac, no bleeding noted to the face at this time, pt has prostate & urethra cancer at this time, denies LOC, MAE, takes daily ASA

## 2017-11-13 NOTE — Telephone Encounter (Signed)
FYI-Annette from [facility] wanting you to know patient fell off golf cart again and on his way to [facility] ER with hematoma under his left eye,cut on forehead.

## 2017-11-13 NOTE — ED Notes (Signed)
Pt updated

## 2017-11-13 NOTE — Telephone Encounter (Signed)
noted 
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
## 2017-11-16 ENCOUNTER — Other Ambulatory Visit: Payer: Self-pay | Admitting: Family Medicine

## 2017-11-16 ENCOUNTER — Encounter: Payer: Self-pay | Admitting: Family Medicine

## 2017-11-16 DIAGNOSIS — M170 Bilateral primary osteoarthritis of knee: Secondary | ICD-10-CM | POA: Diagnosis not present

## 2017-11-16 DIAGNOSIS — J449 Chronic obstructive pulmonary disease, unspecified: Secondary | ICD-10-CM | POA: Diagnosis not present

## 2017-11-16 DIAGNOSIS — Z9181 History of falling: Secondary | ICD-10-CM | POA: Diagnosis not present

## 2017-11-17 DIAGNOSIS — J449 Chronic obstructive pulmonary disease, unspecified: Secondary | ICD-10-CM | POA: Diagnosis not present

## 2017-11-17 DIAGNOSIS — Z9181 History of falling: Secondary | ICD-10-CM | POA: Diagnosis not present

## 2017-11-17 DIAGNOSIS — M170 Bilateral primary osteoarthritis of knee: Secondary | ICD-10-CM | POA: Diagnosis not present

## 2017-11-18 DIAGNOSIS — Z9181 History of falling: Secondary | ICD-10-CM | POA: Diagnosis not present

## 2017-11-18 DIAGNOSIS — J449 Chronic obstructive pulmonary disease, unspecified: Secondary | ICD-10-CM | POA: Diagnosis not present

## 2017-11-18 DIAGNOSIS — M170 Bilateral primary osteoarthritis of knee: Secondary | ICD-10-CM | POA: Diagnosis not present

## 2017-11-19 DIAGNOSIS — Z9181 History of falling: Secondary | ICD-10-CM | POA: Diagnosis not present

## 2017-11-19 DIAGNOSIS — M170 Bilateral primary osteoarthritis of knee: Secondary | ICD-10-CM | POA: Diagnosis not present

## 2017-11-19 DIAGNOSIS — J449 Chronic obstructive pulmonary disease, unspecified: Secondary | ICD-10-CM | POA: Diagnosis not present

## 2017-11-20 DIAGNOSIS — J449 Chronic obstructive pulmonary disease, unspecified: Secondary | ICD-10-CM | POA: Diagnosis not present

## 2017-11-20 DIAGNOSIS — M170 Bilateral primary osteoarthritis of knee: Secondary | ICD-10-CM | POA: Diagnosis not present

## 2017-11-20 DIAGNOSIS — Z9181 History of falling: Secondary | ICD-10-CM | POA: Diagnosis not present

## 2017-11-23 ENCOUNTER — Telehealth: Payer: Self-pay | Admitting: Family Medicine

## 2017-11-23 DIAGNOSIS — Z9181 History of falling: Secondary | ICD-10-CM | POA: Diagnosis not present

## 2017-11-23 DIAGNOSIS — J449 Chronic obstructive pulmonary disease, unspecified: Secondary | ICD-10-CM | POA: Diagnosis not present

## 2017-11-23 DIAGNOSIS — M170 Bilateral primary osteoarthritis of knee: Secondary | ICD-10-CM | POA: Diagnosis not present

## 2017-11-23 NOTE — Telephone Encounter (Signed)
Patient has an appointment on 02/08/18 with Dr. Richardson Landry.  His son, [name], wants to know if he should be fasting before this visit?

## 2017-11-24 DIAGNOSIS — Z9181 History of falling: Secondary | ICD-10-CM | POA: Diagnosis not present

## 2017-11-24 DIAGNOSIS — J449 Chronic obstructive pulmonary disease, unspecified: Secondary | ICD-10-CM | POA: Diagnosis not present

## 2017-11-24 DIAGNOSIS — M170 Bilateral primary osteoarthritis of knee: Secondary | ICD-10-CM | POA: Diagnosis not present

## 2017-11-24 NOTE — Telephone Encounter (Signed)
Left message on sons voicemail

## 2017-11-24 NOTE — Telephone Encounter (Signed)
no

## 2017-11-25 DIAGNOSIS — J449 Chronic obstructive pulmonary disease, unspecified: Secondary | ICD-10-CM | POA: Diagnosis not present

## 2017-11-25 DIAGNOSIS — Z9181 History of falling: Secondary | ICD-10-CM | POA: Diagnosis not present

## 2017-11-25 DIAGNOSIS — M170 Bilateral primary osteoarthritis of knee: Secondary | ICD-10-CM | POA: Diagnosis not present

## 2017-11-26 DIAGNOSIS — Z9181 History of falling: Secondary | ICD-10-CM | POA: Diagnosis not present

## 2017-11-26 DIAGNOSIS — M170 Bilateral primary osteoarthritis of knee: Secondary | ICD-10-CM | POA: Diagnosis not present

## 2017-11-26 DIAGNOSIS — J449 Chronic obstructive pulmonary disease, unspecified: Secondary | ICD-10-CM | POA: Diagnosis not present

## 2017-11-27 DIAGNOSIS — Z9181 History of falling: Secondary | ICD-10-CM | POA: Diagnosis not present

## 2017-11-27 DIAGNOSIS — M170 Bilateral primary osteoarthritis of knee: Secondary | ICD-10-CM | POA: Diagnosis not present

## 2017-11-27 DIAGNOSIS — J449 Chronic obstructive pulmonary disease, unspecified: Secondary | ICD-10-CM | POA: Diagnosis not present

## 2017-12-01 DIAGNOSIS — Z9181 History of falling: Secondary | ICD-10-CM | POA: Diagnosis not present

## 2017-12-01 DIAGNOSIS — J449 Chronic obstructive pulmonary disease, unspecified: Secondary | ICD-10-CM | POA: Diagnosis not present

## 2017-12-01 DIAGNOSIS — M170 Bilateral primary osteoarthritis of knee: Secondary | ICD-10-CM | POA: Diagnosis not present

## 2017-12-02 DIAGNOSIS — N2581 Secondary hyperparathyroidism of renal origin: Secondary | ICD-10-CM | POA: Diagnosis not present

## 2017-12-02 DIAGNOSIS — M170 Bilateral primary osteoarthritis of knee: Secondary | ICD-10-CM | POA: Diagnosis not present

## 2017-12-02 DIAGNOSIS — I10 Essential (primary) hypertension: Secondary | ICD-10-CM | POA: Diagnosis not present

## 2017-12-02 DIAGNOSIS — R801 Persistent proteinuria, unspecified: Secondary | ICD-10-CM | POA: Diagnosis not present

## 2017-12-02 DIAGNOSIS — Z9181 History of falling: Secondary | ICD-10-CM | POA: Diagnosis not present

## 2017-12-02 DIAGNOSIS — N184 Chronic kidney disease, stage 4 (severe): Secondary | ICD-10-CM | POA: Diagnosis not present

## 2017-12-02 DIAGNOSIS — J449 Chronic obstructive pulmonary disease, unspecified: Secondary | ICD-10-CM | POA: Diagnosis not present

## 2017-12-03 DIAGNOSIS — G4733 Obstructive sleep apnea (adult) (pediatric): Secondary | ICD-10-CM | POA: Diagnosis not present

## 2017-12-04 DIAGNOSIS — Z9181 History of falling: Secondary | ICD-10-CM | POA: Diagnosis not present

## 2017-12-04 DIAGNOSIS — J449 Chronic obstructive pulmonary disease, unspecified: Secondary | ICD-10-CM | POA: Diagnosis not present

## 2017-12-04 DIAGNOSIS — M170 Bilateral primary osteoarthritis of knee: Secondary | ICD-10-CM | POA: Diagnosis not present

## 2017-12-07 DIAGNOSIS — Z9181 History of falling: Secondary | ICD-10-CM | POA: Diagnosis not present

## 2017-12-07 DIAGNOSIS — J449 Chronic obstructive pulmonary disease, unspecified: Secondary | ICD-10-CM | POA: Diagnosis not present

## 2017-12-07 DIAGNOSIS — M170 Bilateral primary osteoarthritis of knee: Secondary | ICD-10-CM | POA: Diagnosis not present

## 2017-12-09 ENCOUNTER — Other Ambulatory Visit: Payer: Self-pay | Admitting: Dermatology

## 2017-12-09 DIAGNOSIS — L299 Pruritus, unspecified: Secondary | ICD-10-CM | POA: Diagnosis not present

## 2017-12-09 DIAGNOSIS — L281 Prurigo nodularis: Secondary | ICD-10-CM | POA: Diagnosis not present

## 2017-12-09 DIAGNOSIS — M170 Bilateral primary osteoarthritis of knee: Secondary | ICD-10-CM | POA: Diagnosis not present

## 2017-12-09 DIAGNOSIS — Z9181 History of falling: Secondary | ICD-10-CM | POA: Diagnosis not present

## 2017-12-09 DIAGNOSIS — J449 Chronic obstructive pulmonary disease, unspecified: Secondary | ICD-10-CM | POA: Diagnosis not present

## 2017-12-09 DIAGNOSIS — D485 Neoplasm of uncertain behavior of skin: Secondary | ICD-10-CM | POA: Diagnosis not present

## 2017-12-10 DIAGNOSIS — J449 Chronic obstructive pulmonary disease, unspecified: Secondary | ICD-10-CM | POA: Diagnosis not present

## 2017-12-10 DIAGNOSIS — M170 Bilateral primary osteoarthritis of knee: Secondary | ICD-10-CM | POA: Diagnosis not present

## 2017-12-10 DIAGNOSIS — Z9181 History of falling: Secondary | ICD-10-CM | POA: Diagnosis not present

## 2017-12-11 DIAGNOSIS — Z8554 Personal history of malignant neoplasm of ureter: Secondary | ICD-10-CM | POA: Diagnosis not present

## 2017-12-11 DIAGNOSIS — Z8551 Personal history of malignant neoplasm of bladder: Secondary | ICD-10-CM | POA: Diagnosis not present

## 2017-12-11 DIAGNOSIS — N184 Chronic kidney disease, stage 4 (severe): Secondary | ICD-10-CM | POA: Diagnosis not present

## 2017-12-11 DIAGNOSIS — N3020 Other chronic cystitis without hematuria: Secondary | ICD-10-CM | POA: Diagnosis not present

## 2017-12-15 DIAGNOSIS — N200 Calculus of kidney: Secondary | ICD-10-CM | POA: Diagnosis not present

## 2017-12-15 DIAGNOSIS — C661 Malignant neoplasm of right ureter: Secondary | ICD-10-CM | POA: Diagnosis not present

## 2017-12-15 DIAGNOSIS — Z436 Encounter for attention to other artificial openings of urinary tract: Secondary | ICD-10-CM | POA: Diagnosis not present

## 2017-12-17 DIAGNOSIS — J449 Chronic obstructive pulmonary disease, unspecified: Secondary | ICD-10-CM | POA: Diagnosis not present

## 2017-12-17 DIAGNOSIS — M170 Bilateral primary osteoarthritis of knee: Secondary | ICD-10-CM | POA: Diagnosis not present

## 2017-12-17 DIAGNOSIS — Z9181 History of falling: Secondary | ICD-10-CM | POA: Diagnosis not present

## 2017-12-21 DIAGNOSIS — J449 Chronic obstructive pulmonary disease, unspecified: Secondary | ICD-10-CM | POA: Diagnosis not present

## 2017-12-21 DIAGNOSIS — Z9181 History of falling: Secondary | ICD-10-CM | POA: Diagnosis not present

## 2017-12-21 DIAGNOSIS — M170 Bilateral primary osteoarthritis of knee: Secondary | ICD-10-CM | POA: Diagnosis not present

## 2017-12-22 DIAGNOSIS — M170 Bilateral primary osteoarthritis of knee: Secondary | ICD-10-CM | POA: Diagnosis not present

## 2017-12-22 DIAGNOSIS — J449 Chronic obstructive pulmonary disease, unspecified: Secondary | ICD-10-CM | POA: Diagnosis not present

## 2017-12-22 DIAGNOSIS — Z9181 History of falling: Secondary | ICD-10-CM | POA: Diagnosis not present

## 2017-12-24 DIAGNOSIS — J449 Chronic obstructive pulmonary disease, unspecified: Secondary | ICD-10-CM | POA: Diagnosis not present

## 2017-12-24 DIAGNOSIS — M170 Bilateral primary osteoarthritis of knee: Secondary | ICD-10-CM | POA: Diagnosis not present

## 2017-12-24 DIAGNOSIS — Z9181 History of falling: Secondary | ICD-10-CM | POA: Diagnosis not present

## 2017-12-27 ENCOUNTER — Other Ambulatory Visit: Payer: Self-pay

## 2017-12-27 ENCOUNTER — Emergency Department (HOSPITAL_COMMUNITY)
Admission: EM | Admit: 2017-12-27 | Discharge: 2017-12-27 | Disposition: A | Discharge status: Home / Self Care | Payer: Medicare Other | Attending: Emergency Medicine | Admitting: Emergency Medicine

## 2017-12-27 ENCOUNTER — Encounter (HOSPITAL_COMMUNITY): Payer: Self-pay | Admitting: Emergency Medicine

## 2017-12-27 DIAGNOSIS — R310 Gross hematuria: Secondary | ICD-10-CM | POA: Diagnosis not present

## 2017-12-27 DIAGNOSIS — E1122 Type 2 diabetes mellitus with diabetic chronic kidney disease: Secondary | ICD-10-CM | POA: Insufficient documentation

## 2017-12-27 DIAGNOSIS — I129 Hypertensive chronic kidney disease with stage 1 through stage 4 chronic kidney disease, or unspecified chronic kidney disease: Secondary | ICD-10-CM | POA: Diagnosis not present

## 2017-12-27 DIAGNOSIS — Z79899 Other long term (current) drug therapy: Secondary | ICD-10-CM | POA: Insufficient documentation

## 2017-12-27 DIAGNOSIS — N184 Chronic kidney disease, stage 4 (severe): Secondary | ICD-10-CM | POA: Diagnosis not present

## 2017-12-27 DIAGNOSIS — R350 Frequency of micturition: Secondary | ICD-10-CM | POA: Diagnosis not present

## 2017-12-27 DIAGNOSIS — Z8551 Personal history of malignant neoplasm of bladder: Secondary | ICD-10-CM | POA: Insufficient documentation

## 2017-12-27 DIAGNOSIS — I2510 Atherosclerotic heart disease of native coronary artery without angina pectoris: Secondary | ICD-10-CM | POA: Insufficient documentation

## 2017-12-27 DIAGNOSIS — Z7982 Long term (current) use of aspirin: Secondary | ICD-10-CM | POA: Diagnosis not present

## 2017-12-27 DIAGNOSIS — R300 Dysuria: Secondary | ICD-10-CM | POA: Diagnosis not present

## 2017-12-27 DIAGNOSIS — F17210 Nicotine dependence, cigarettes, uncomplicated: Secondary | ICD-10-CM | POA: Diagnosis not present

## 2017-12-27 DIAGNOSIS — R39198 Other difficulties with micturition: Secondary | ICD-10-CM | POA: Diagnosis not present

## 2017-12-27 DIAGNOSIS — J449 Chronic obstructive pulmonary disease, unspecified: Secondary | ICD-10-CM | POA: Diagnosis not present

## 2017-12-27 DIAGNOSIS — R339 Retention of urine, unspecified: Secondary | ICD-10-CM | POA: Diagnosis present

## 2017-12-27 LAB — CBC WITH DIFFERENTIAL/PLATELET
BASOS PCT: 0 %
Basophils Absolute: 0 10*3/uL (ref 0.0–0.1)
Eosinophils Absolute: 0.6 10*3/uL (ref 0.0–0.7)
Eosinophils Relative: 10 %
HEMATOCRIT: 28.5 % — AB (ref 39.0–52.0)
HEMOGLOBIN: 8.9 g/dL — AB (ref 13.0–17.0)
LYMPHS ABS: 1.2 10*3/uL (ref 0.7–4.0)
LYMPHS PCT: 21 %
MCH: 29.4 pg (ref 26.0–34.0)
MCHC: 31.2 g/dL (ref 30.0–36.0)
MCV: 94.1 fL (ref 78.0–100.0)
MONOS PCT: 9 %
Monocytes Absolute: 0.5 10*3/uL (ref 0.1–1.0)
NEUTROS ABS: 3.5 10*3/uL (ref 1.7–7.7)
NEUTROS PCT: 60 %
Platelets: 95 10*3/uL — ABNORMAL LOW (ref 150–400)
RBC: 3.03 MIL/uL — ABNORMAL LOW (ref 4.22–5.81)
RDW: 18.1 % — AB (ref 11.5–15.5)
WBC: 5.8 10*3/uL (ref 4.0–10.5)

## 2017-12-27 LAB — URINALYSIS, ROUTINE W REFLEX MICROSCOPIC
BACTERIA UA: NONE SEEN
Bilirubin Urine: NEGATIVE
Glucose, UA: 50 mg/dL — AB
Ketones, ur: NEGATIVE mg/dL
NITRITE: NEGATIVE
PROTEIN: 100 mg/dL — AB
RBC / HPF: >50 RBC/hpf — ABNORMAL HIGH (ref 0–5)
SPECIFIC GRAVITY, URINE: 1.014 (ref 1.005–1.030)
pH: 6 (ref 5.0–8.0)

## 2017-12-27 LAB — BASIC METABOLIC PANEL
Anion gap: 6 (ref 5–15)
BUN: 40 mg/dL — AB (ref 6–20)
CALCIUM: 8.9 mg/dL (ref 8.9–10.3)
CHLORIDE: 110 mmol/L (ref 101–111)
CO2: 23 mmol/L (ref 22–32)
CREATININE: 3.05 mg/dL — AB (ref 0.61–1.24)
GFR calc non Af Amer: 18 mL/min — ABNORMAL LOW (ref >60)
GFR, EST AFRICAN AMERICAN: 21 mL/min — AB (ref >60)
GLUCOSE: 121 mg/dL — AB (ref 65–99)
Potassium: 4.1 mmol/L (ref 3.5–5.1)
Sodium: 139 mmol/L (ref 135–145)

## 2017-12-27 MED ORDER — CEPHALEXIN 250 MG PO CAPS: 250.0000 mg | ORAL_CAPSULE | Freq: Three times a day (TID) | ORAL | 0 refills | Status: DC

## 2017-12-27 NOTE — Discharge Instructions (Addendum)
Take antibiotics as prescribed and follow-up with Dr. Rosana Hoes.  Your hemoglobin is low and your creatinine is high but these numbers appear to be stable.  Return to the ED if you are not able to urinate, you develop worsening pain, fever, vomiting or any other concerns.

## 2017-12-27 NOTE — ED Provider Notes (Signed)
[facility] EMERGENCY DEPARTMENT Provider Note   CSN: [id number] Arrival date & time: 12/27/17  5035     History   Chief Complaint Chief Complaint  Patient presents with  . Urinary Retention    HPI Kyle [name] is a 84 y.o. male.  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  Patient feels like he try to go to the bathroom but nothing came out he just had a blood clot in his depends.  His wife states his been having urinary frequency and urgency for the past 10 days ever since he had a unilateral nephro-ureteral tube conversion with placement of a 8 Fr, 24 cm catheter.  This was done at [facility] by Dr. Rosana Hoes his urologist.  He has not had any fevers, chills, nausea or vomiting.  He does not take any blood thinners.  This is the first time he has had blood in his urine by report.  He feels like he needs to urinate but nothing comes out.  Wife states he has been having frequency and urgency ever since the ureteral tube conversion was done.  He called urology at [facility] and was told to come in for a urinary catheter.  The history is provided by the patient.    Past Medical History:  Diagnosis Date  . AAA (abdominal aortic aneurysm) ([place]) 2004   s/p repair 2004; 4.3 cm infrarenal in 05/2011  . Abnormality of gait 02/23/2013  . Adenocarcinoma of right lung ([place]) 02/24/2011   Ct A/P 2012:  2cm lung mass RLL PET 4656:  Hypermetabolic RLL mass, no other hypermetabolic areas. TTNA 02/2011:  Adenocarcinoma, markers c/w lung origin Right lower lobe superior segmentectomy. 04/01/2011 Dr. Arlyce Dice   . Adenocarcinoma, lung ([place]) 01/2011   transthoracic FNA; resection of the superior segment of the RLL in 03/2011; negative nodes; no chemotherapy nor radiation planned  . Arm fracture    right arm  . Arteriosclerotic cardiovascular disease (ASCVD) 1973, 12/2010   S/P  NSTEMI secondary to distal RCA/PL lesion, tx medically.  EF of  55%-60% per  echo.  . Benign prostatic hypertrophy    s/p transurethral resection of the prostate  . Bilateral renal masses    Cystic, more prominent on CT in 12/2010 than 2007; followed by Dr. Rosana Hoes  . Bladder cancer [facility]) 1996   Transurethral resection of the bladder + chemotherapy/BCG as premed  . Cataract   . Chronic kidney disease    Creatinine 1.4 on discharge 12/20/2100; proteinuria; normal renal ultrasound in 2010; recent creatinines of 1.7-2.; Bilateral cystic renal masses by CT in 2011  . COPD (chronic obstructive pulmonary disease) ([place])   . Coronary artery disease   . Cough    thick phlegm  . Diabetes mellitus    Type II  . Diplopia 02/23/2013  . Diverticulosis   . Essential and other specified forms of tremor 02/23/2013  . Hx of Clostridium difficile infection   . Hyperlipidemia   . Hypertension   . Insomnia   . ITP (idiopathic thrombocytopenic purpura) 09/07/2012   Chronic ITP of adults versus medication-induced ITP.  Stable  . Myocardial infarction ([place])   . Nephrolithiasis 2012   ARF in 01/2011 due to obstructing nephrolithiasis  . Obesity   . OSA (obstructive sleep apnea)   . Polyneuropathy in diabetes(357.2) 02/23/2013  . Thrombocytopenia ([place])   . Tobacco abuse  50-pack-year consumption; quit in 12/2010  . Tubular adenoma of colon   . Ventral hernia     Patient Active Problem List   Diagnosis Date Noted  . Stage 4 chronic kidney disease ([place]) 06/30/2016  . Subdural hematoma, post-traumatic ([place]) 03/21/2016  . Subdural hematoma ([place]) 03/21/2016  . Malignant neoplasm of trigone of urinary bladder ([place]) 02/12/2016  . Bilirubinemia 12/12/2015  . Thrombocytopenia ([place]) 11/13/2015  . Cough 10/03/2015  . Allergic rhinitis 10/03/2015  . Secondary hyperparathyroidism ([place]) 09/06/2015  . Tremor, essential 08/27/2015  . History of colonic polyps   . Diverticulosis of colon without hemorrhage   .  Malignant neoplasm of lateral wall of urinary bladder ([place]) 01/12/2014  . CIS (carcinoma in situ of bladder) 08/22/2013  . Static tremor 02/23/2013  . Diplopia 02/23/2013  . Diabetic polyneuropathy ([place]) 02/23/2013  . Abnormality of gait 02/23/2013  . COPD exacerbation ([place]) 01/31/2013  . Fracture of right distal radius 12/14/2012  . Diabetes mellitus ([place]) 11/30/2012  . Idiopathic thrombocytopenic purpura ([place]) 09/07/2012  . Depression 07/28/2012  . Benign prostate hyperplasia 06/10/2012  . LLQ pain 11/13/2011  . Hernia of anterior abdominal wall 10/28/2011  . AAA (abdominal aortic aneurysm) ([place])   . Tobacco abuse   . Diarrhea 10/22/2011  . Tubular adenoma of colon   . Bladder neck obstruction 06/05/2011  . Obstructive sleep apnea on CPAP 03/27/2011  . Fasting hyperglycemia 03/27/2011  . Recurrent nephrolithiasis 03/27/2011  . Carcinoma of bladder ([place]) 03/27/2011  . COPD (chronic obstructive pulmonary disease) ([place]) 02/24/2011  . Adenocarcinoma of right lung ([place]) 02/24/2011  . CAD (coronary artery disease) 01/06/2011  . Hypertension 01/06/2011  . Chronic kidney disease, stage III (moderate) ([place]) 01/06/2011    Past Surgical History:  Procedure Laterality Date  . ABDOMINAL AORTIC ANEURYSM REPAIR  2004  . CARDIAC CATHETERIZATION    . COLONOSCOPY  03/19/2010   Dr. Gala Romney -(poor prep) Anal papilla, rectal hyperplastic polyp, tubular adenoma removed splenic flexure, left-sided diverticula  . COLONOSCOPY  11/28/2004   RMR:  Diminutive rectal and left colon polyps as described above, cold  biopsied/removed/  Left sided diverticula. The remainder of the colonic mucosa appeared normal.  . COLONOSCOPY   09/15/01   RMR: Multiple diminutive polyps destroyed with dermolysis as described above/ Multiple small polyps on stalks in the colon resected with snare cautery/ Scattered pan colonic diverticulum/ The remainder of the colonic mucosa appeared normal  . COLONOSCOPY N/A 05/01/2015    Procedure: COLONOSCOPY;  Surgeon: Daneil Dolin, MD;  Location: [facility]ITE;  Service: Endoscopy;  Laterality: N/A;  1115  . CYSTECTOMY    . CYSTOSCOPY  04/2014  . CYSTOSTOMY W/ BLADDER BIOPSY    . FLEXIBLE SIGMOIDOSCOPY  2014   Dr. Olevia Perches: tubular adenoma, negative stool studies   . LUNG LOBECTOMY    . TONSILLECTOMY    . TRANSURETHRAL RESECTION OF PROSTATE    . VIDEO BRONCHOSCOPY WITH ENDOBRONCHIAL NAVIGATION N/A 10/10/2013   Procedure: VIDEO BRONCHOSCOPY WITH ENDOBRONCHIAL NAVIGATION;  Surgeon: Melrose Nakayama, MD;  Location: [place];  Service: Thoracic;  Laterality: N/A;  NO BLOOD THINNERS BUT PATIENT HAS ITP  . WEDGE RESECTION  04/2011   carcinoma of lung        Home Medications    Prior to Admission medications   Medication Sig Start Date End Date Taking? Authorizing Provider  acetaminophen (TYLENOL) 500 MG tablet Take 500-1,000 mg by mouth at bedtime.    [provider]  albuterol (PROAIR HFA) 108 (90  BASE) MCG/ACT inhaler Inhale 2 puffs into the lungs every 6 (six) hours as needed for wheezing. 11/15/14 05/06/18  Mikey Kirschner, MD  ALPRAZolam Duanne Moron) 0.5 MG tablet TAKE ONE TABLET TWICE DAILY AS NEEDED FOR ANXIETY 10/20/16   Mikey Kirschner, MD  aspirin EC 81 MG tablet Take 81 mg by mouth daily.    [provider]  cetirizine (ZYRTEC) 10 MG tablet Take 1 tablet (10 mg total) by mouth daily. 10/20/16   Mikey Kirschner, MD  cholecalciferol (VITAMIN D) 1000 units tablet Take 2,000 Units by mouth daily.    [provider]  citalopram (CELEXA) 20 MG tablet TAKE ONE (1) TABLET BY MOUTH EVERY DAY 11/09/17   Mikey Kirschner, MD  fluticasone (FLONASE) 50 MCG/ACT nasal spray Place 1 spray into both nostrils daily. 11/09/17   Mikey Kirschner, MD  metoprolol tartrate (LOPRESSOR) 50 MG tablet TAKE ONE-HALF TABLET BY MOUTH TWICE DAILY 11/09/17   Mikey Kirschner, MD  niacin (NIASPAN) 500 MG CR tablet Take 2 tablets (1,000 mg total) by mouth at bedtime.  10/20/16   Mikey Kirschner, MD  nitroGLYCERIN (NITROSTAT) 0.4 MG SL tablet Place 1 tablet (0.4 mg total) under the tongue every 5 (five) minutes as needed. Call MD if need more than 2 Patient taking differently: Place 0.4 mg under the tongue every 5 (five) minutes as needed for chest pain. Call MD if need more than 2 04/18/16   Mikey Kirschner, MD  nitroGLYCERIN (NITROSTAT) 0.4 MG SL tablet PLACE ONE TABLET UNDER THE TONGUE EVERY 5 MINUTES AS NEEDED. CALL MD IF NEED MORE THAN 2. 10/08/17   Mikey Kirschner, MD  Pembrolizumab [facility] IV) Inject into the vein every 28 (twenty-eight) days. At [facility]     [provider]  pravastatin (PRAVACHOL) 80 MG tablet TAKE ONE (1) TABLET EACH DAY 11/09/17   Mikey Kirschner, MD  Probiotic Product (PROBIOTIC PEARLS ADVANTAGE) CAPS Take 1 capsule by mouth daily.    [provider]  SPIRIVA HANDIHALER 18 MCG inhalation capsule PLACE ONE CAPSULE INTO INHALER AND INHALE DAILY 10/20/17   Juanito Doom, MD  tiotropium (SPIRIVA) 18 MCG inhalation capsule Place 18 mcg into inhaler and inhale daily.    [provider]  vitamin B-12 1000 MCG tablet Take 1 tablet (1,000 mcg total) by mouth daily. 03/24/16   Rai, Vernelle Emerald, MD  budesonide-formoterol (SYMBICORT) 160-4.5 MCG/ACT inhaler Inhale 2 puffs into the lungs 2 (two) times daily.    10/06/11  [provider]    Family History Family History  Problem Relation Age of Onset  . Aortic aneurysm Mother   . Emphysema Father        smoker  . Clotting disorder Father   . Arthritis Father   . Hypertension Father   . Diabetes Father   . Aortic aneurysm Father   . Tremor Father   . Stroke Paternal Grandmother   . Other Paternal Grandfather        brain aneurysm  . Colon cancer Cousin     Social History Social History   Tobacco Use  . Smoking status: Current Every Day Smoker    Packs/day: 1.00    Years: 50.00    Pack years: 50.00    Types: Cigarettes    Start date:  07/22/1955  . Smokeless tobacco: Never Used  . Tobacco comment: "smokes a few days a week" 09/29/17  Substance Use Topics  . Alcohol use: No    Alcohol/week:  0.0 oz  . Drug use: No     Allergies   Codeine and Etodolac   Review of Systems Review of Systems  Constitutional: Negative for activity change, appetite change and fever.  HENT: Negative for congestion and rhinorrhea.   Respiratory: Negative for cough, chest tightness and shortness of breath.   Gastrointestinal: Negative for abdominal pain, nausea and vomiting.  Genitourinary: Positive for decreased urine volume, difficulty urinating, dysuria, frequency, hematuria and urgency. Negative for flank pain, penile pain and testicular pain.  Musculoskeletal: Negative for arthralgias and myalgias.  Skin: Negative for rash.  Neurological: Negative for dizziness, weakness and headaches.    all other systems are negative except as noted in the HPI and PMH.    Physical Exam Updated Vital Signs BP 133/90 (BP Location: Right Arm)   Pulse 78   Temp 98.5 F (36.9 C) (Oral)   Resp 18   Wt 111.1 kg (245 lb)   SpO2 95%   BMI 33.23 kg/m   Physical Exam  Constitutional: He is oriented to person, place, and time. He appears well-developed and well-nourished. No distress.  HENT:  Head: Normocephalic and atraumatic.  Mouth/Throat: Oropharynx is clear and moist. No oropharyngeal exudate.  Eyes: Pupils are equal, round, and reactive to light. Conjunctivae and EOM are normal.  Neck: Normal range of motion. Neck supple.  No meningismus.  Cardiovascular: Normal rate, regular rhythm, normal heart sounds and intact distal pulses.  No murmur heard. Pulmonary/Chest: Effort normal and breath sounds normal. No respiratory distress.  Abdominal: Soft. There is tenderness. There is no rebound and no guarding.  Ventral hernia, soft.  Equal femoral pulses.  Genitourinary:  Genitourinary Comments: Hidden penis. No blood at meatus.  No testicular  tenderness  Musculoskeletal: Normal range of motion. He exhibits no edema or tenderness.  R sided nephrostomy clamped  Neurological: He is alert and oriented to person, place, and time. No cranial nerve deficit. He exhibits normal muscle tone. Coordination normal.  No ataxia on finger to nose bilaterally. No pronator drift. 5/5 strength throughout. CN 2-12 intact.Equal grip strength. Sensation intact.   Skin: Skin is warm.  Psychiatric: He has a normal mood and affect. His behavior is normal.  Nursing note and vitals reviewed.    ED Treatments / Results  Labs (all labs ordered are listed, but only abnormal results are displayed) Labs Reviewed  CBC WITH DIFFERENTIAL/PLATELET - Abnormal; Notable for the following components:      Result Value   RBC 3.03 (*)    Hemoglobin 8.9 (*)    HCT 28.5 (*)    RDW 18.1 (*)    Platelets 95 (*)    All other components within normal limits  BASIC METABOLIC PANEL - Abnormal; Notable for the following components:   Glucose, Bld 121 (*)    BUN 40 (*)    Creatinine, Ser 3.05 (*)    GFR calc non Af Amer 18 (*)    GFR calc Af Amer 21 (*)    All other components within normal limits  URINALYSIS, ROUTINE W REFLEX MICROSCOPIC - Abnormal; Notable for the following components:   APPearance CLOUDY (*)    Glucose, UA 50 (*)    Hgb urine dipstick LARGE (*)    Protein, ur 100 (*)    Leukocytes, UA LARGE (*)    RBC / HPF >50 (*)    All other components within normal limits  URINE CULTURE    EKG None  Radiology No results found.  Procedures Procedures (including  critical care time)  Medications Ordered in ED Medications - No data to display   Initial Impression / Assessment and Plan / ED Course  I have reviewed the triage vital signs and the nursing notes.  Pertinent labs & imaging results that were available during my care of the patient were reviewed by me and considered in my medical decision making (see chart for details).    Patient  with blood clot on attempted urination with urgency and frequency and sensation of bladder fullness.  Denies abdominal pain.  Bladder scan shows 120 mL's in the bladder  Discussed with urology resident Dr. Corbin Ade at [facility]. he states patient's frequency and urgency may be due to the irritation from the nephro ureteral tube.  States that patient is able to urinate he does not need catheter.  Patient unable to urinate does recommend catheter placement which she states will not interfere with nephroureteral tube.  Patient able to void about 75 cc of yellow urine spontaneously.  He had 120 cc on bladder scan.  Will defer catheter at this time.  Labs show stable creatinine in the 3 range.  Hemoglobin today is 8.9 which is close to patient's baseline at [facility] over the past 3 months when he has been running in the 9-10 range. Urine will be sent for culture and antibiotics will be started.  Discussed CT scan with patient and wife.  They declined CT scan today as they feel it will not change management and patient is known to have bladder tumor as well as ureteral tumor and previous AAA repair.  On reassessment, patient has no abdominal pain and no back pain.  He is able to urinate.  Will defer catheter at this time.  Will give antibiotics for possible UTI while culture is pending.  Follow-up with Dr. Rosana Hoes.  Return precautions discussed Final Clinical Impressions(s) / ED Diagnoses   Final diagnoses:  Gross hematuria    ED Discharge Orders    None       Niyah Mamaril, Annie Main, MD 12/27/17 [id number]

## 2017-12-27 NOTE — ED Triage Notes (Signed)
Pt had stent placed in his bladder 2 weeks ago. Tonight the pt states he passed a blood clot in his urine.

## 2017-12-27 NOTE — ED Notes (Signed)
Pt able to stand at bedside and void 83ml urine.

## 2017-12-28 ENCOUNTER — Telehealth: Payer: Self-pay | Admitting: Family Medicine

## 2017-12-28 DIAGNOSIS — N200 Calculus of kidney: Secondary | ICD-10-CM | POA: Diagnosis not present

## 2017-12-28 DIAGNOSIS — N184 Chronic kidney disease, stage 4 (severe): Secondary | ICD-10-CM | POA: Diagnosis not present

## 2017-12-28 DIAGNOSIS — I10 Essential (primary) hypertension: Secondary | ICD-10-CM | POA: Diagnosis not present

## 2017-12-28 DIAGNOSIS — N2581 Secondary hyperparathyroidism of renal origin: Secondary | ICD-10-CM | POA: Diagnosis not present

## 2017-12-28 NOTE — Telephone Encounter (Signed)
Pt.notified

## 2017-12-28 NOTE — Telephone Encounter (Signed)
Yes aware already dr Rosana Hoes on fri appropriate and really needs to f u with his psecialist for these issues

## 2017-12-28 NOTE — Telephone Encounter (Signed)
Left message to return call 

## 2017-12-28 NOTE — Telephone Encounter (Signed)
Pt seen at ER yesterday morning - passed a blood clot & then was unable to urinate - was able to urinate @ ER & did not require a catheter  ER put pt on antibiotics & said for pt to see PCP & urology in a couple days  Pt has appt with his urologist, Dr. Rosana Hoes @ [facility] for this coming Friday @ 10am  Family wonders if pt needs to be seen here before Friday

## 2017-12-28 NOTE — Telephone Encounter (Signed)
FYI only, son stopped by this morning and wanted to make Dr. Richardson Landry aware of his dad going to the ER over the weekend.  They are making an appt with the Urologist and will call us back if they have problems getting in with him.

## 2017-12-28 NOTE — Telephone Encounter (Signed)
ER note just states to follow up with dr Rosana Hoes.

## 2017-12-29 DIAGNOSIS — M170 Bilateral primary osteoarthritis of knee: Secondary | ICD-10-CM | POA: Diagnosis not present

## 2017-12-29 DIAGNOSIS — J449 Chronic obstructive pulmonary disease, unspecified: Secondary | ICD-10-CM | POA: Diagnosis not present

## 2017-12-29 DIAGNOSIS — Z9181 History of falling: Secondary | ICD-10-CM | POA: Diagnosis not present

## 2017-12-29 LAB — URINE CULTURE

## 2017-12-30 DIAGNOSIS — J449 Chronic obstructive pulmonary disease, unspecified: Secondary | ICD-10-CM | POA: Diagnosis not present

## 2017-12-30 DIAGNOSIS — M170 Bilateral primary osteoarthritis of knee: Secondary | ICD-10-CM | POA: Diagnosis not present

## 2017-12-30 DIAGNOSIS — Z9181 History of falling: Secondary | ICD-10-CM | POA: Diagnosis not present

## 2017-12-30 NOTE — Telephone Encounter (Signed)
Post ED Visit - Positive Culture Follow-up: Successful Patient Follow-Up  Culture assessed and recommendations reviewed by: []  Elenor Quinones, Pharm.D. []  Heide Guile, Pharm.D., BCPS AQ-ID []  Parks Neptune, Pharm.D., BCPS []  Alycia Rossetti, Pharm.D., BCPS []  [place], Pharm.D., BCPS, AAHIVP []  Legrand Como, Pharm.D., BCPS, AAHIVP []  Salome Arnt, PharmD, BCPS []  Wynell Balloon, PharmD []  Vincenza Hews, PharmD, BCPS Angus Seller PharmD  Positive urine culture  []  Patient discharged without antimicrobial prescription and treatment is now indicated [x]  Organism is resistant to prescribed ED discharge antimicrobial []  Patient with positive blood cultures  Changes discussed with ED provider: Coral Ceo [facility] New antibiotic prescription stop cephalexin, start doxycycline 100mg  po bid x 7 days Called to [place] wife Kyle [name], 12/30/2017 1425 also faxed results to Dr. Shanon Brow urology @ [address], [place] 12/30/2017, 2:23 PM

## 2018-01-01 DIAGNOSIS — N281 Cyst of kidney, acquired: Secondary | ICD-10-CM | POA: Diagnosis not present

## 2018-01-01 DIAGNOSIS — C661 Malignant neoplasm of right ureter: Secondary | ICD-10-CM | POA: Diagnosis not present

## 2018-01-01 DIAGNOSIS — J449 Chronic obstructive pulmonary disease, unspecified: Secondary | ICD-10-CM | POA: Diagnosis not present

## 2018-01-01 DIAGNOSIS — Z9181 History of falling: Secondary | ICD-10-CM | POA: Diagnosis not present

## 2018-01-01 DIAGNOSIS — M170 Bilateral primary osteoarthritis of knee: Secondary | ICD-10-CM | POA: Diagnosis not present

## 2018-01-01 DIAGNOSIS — C672 Malignant neoplasm of lateral wall of bladder: Secondary | ICD-10-CM | POA: Diagnosis not present

## 2018-01-01 DIAGNOSIS — N200 Calculus of kidney: Secondary | ICD-10-CM | POA: Diagnosis not present

## 2018-01-01 DIAGNOSIS — N184 Chronic kidney disease, stage 4 (severe): Secondary | ICD-10-CM | POA: Diagnosis not present

## 2018-01-04 DIAGNOSIS — J449 Chronic obstructive pulmonary disease, unspecified: Secondary | ICD-10-CM | POA: Diagnosis not present

## 2018-01-05 DIAGNOSIS — M170 Bilateral primary osteoarthritis of knee: Secondary | ICD-10-CM | POA: Diagnosis not present

## 2018-01-05 DIAGNOSIS — J449 Chronic obstructive pulmonary disease, unspecified: Secondary | ICD-10-CM | POA: Diagnosis not present

## 2018-01-05 DIAGNOSIS — Z9181 History of falling: Secondary | ICD-10-CM | POA: Diagnosis not present

## 2018-01-06 DIAGNOSIS — Z9181 History of falling: Secondary | ICD-10-CM | POA: Diagnosis not present

## 2018-01-06 DIAGNOSIS — M170 Bilateral primary osteoarthritis of knee: Secondary | ICD-10-CM | POA: Diagnosis not present

## 2018-01-06 DIAGNOSIS — Z936 Other artificial openings of urinary tract status: Secondary | ICD-10-CM | POA: Diagnosis not present

## 2018-01-06 DIAGNOSIS — J449 Chronic obstructive pulmonary disease, unspecified: Secondary | ICD-10-CM | POA: Diagnosis not present

## 2018-01-08 DIAGNOSIS — Z9181 History of falling: Secondary | ICD-10-CM | POA: Diagnosis not present

## 2018-01-08 DIAGNOSIS — J449 Chronic obstructive pulmonary disease, unspecified: Secondary | ICD-10-CM | POA: Diagnosis not present

## 2018-01-08 DIAGNOSIS — M170 Bilateral primary osteoarthritis of knee: Secondary | ICD-10-CM | POA: Diagnosis not present

## 2018-01-12 DIAGNOSIS — Z9181 History of falling: Secondary | ICD-10-CM | POA: Diagnosis not present

## 2018-01-12 DIAGNOSIS — M170 Bilateral primary osteoarthritis of knee: Secondary | ICD-10-CM | POA: Diagnosis not present

## 2018-01-12 DIAGNOSIS — J449 Chronic obstructive pulmonary disease, unspecified: Secondary | ICD-10-CM | POA: Diagnosis not present

## 2018-01-14 DIAGNOSIS — M170 Bilateral primary osteoarthritis of knee: Secondary | ICD-10-CM | POA: Diagnosis not present

## 2018-01-14 DIAGNOSIS — J449 Chronic obstructive pulmonary disease, unspecified: Secondary | ICD-10-CM | POA: Diagnosis not present

## 2018-01-14 DIAGNOSIS — Z9181 History of falling: Secondary | ICD-10-CM | POA: Diagnosis not present

## 2018-01-18 DIAGNOSIS — Z85118 Personal history of other malignant neoplasm of bronchus and lung: Secondary | ICD-10-CM | POA: Diagnosis not present

## 2018-01-18 DIAGNOSIS — C672 Malignant neoplasm of lateral wall of bladder: Secondary | ICD-10-CM | POA: Diagnosis not present

## 2018-01-18 DIAGNOSIS — N200 Calculus of kidney: Secondary | ICD-10-CM | POA: Diagnosis not present

## 2018-01-18 DIAGNOSIS — C661 Malignant neoplasm of right ureter: Secondary | ICD-10-CM | POA: Diagnosis not present

## 2018-01-18 DIAGNOSIS — R350 Frequency of micturition: Secondary | ICD-10-CM | POA: Diagnosis not present

## 2018-01-19 ENCOUNTER — Other Ambulatory Visit: Payer: Self-pay | Admitting: Family Medicine

## 2018-01-19 NOTE — Telephone Encounter (Signed)
Ok six mo ref

## 2018-01-25 DIAGNOSIS — N2581 Secondary hyperparathyroidism of renal origin: Secondary | ICD-10-CM | POA: Diagnosis not present

## 2018-01-25 DIAGNOSIS — N184 Chronic kidney disease, stage 4 (severe): Secondary | ICD-10-CM | POA: Diagnosis not present

## 2018-01-25 DIAGNOSIS — I10 Essential (primary) hypertension: Secondary | ICD-10-CM | POA: Diagnosis not present

## 2018-01-25 DIAGNOSIS — D631 Anemia in chronic kidney disease: Secondary | ICD-10-CM | POA: Diagnosis not present

## 2018-01-25 DIAGNOSIS — R801 Persistent proteinuria, unspecified: Secondary | ICD-10-CM | POA: Diagnosis not present

## 2018-01-28 DIAGNOSIS — R930 Abnormal findings on diagnostic imaging of skull and head, not elsewhere classified: Secondary | ICD-10-CM | POA: Diagnosis not present

## 2018-01-28 DIAGNOSIS — Z8551 Personal history of malignant neoplasm of bladder: Secondary | ICD-10-CM | POA: Diagnosis not present

## 2018-01-28 DIAGNOSIS — C672 Malignant neoplasm of lateral wall of bladder: Secondary | ICD-10-CM | POA: Diagnosis not present

## 2018-01-28 DIAGNOSIS — R410 Disorientation, unspecified: Secondary | ICD-10-CM | POA: Diagnosis not present

## 2018-02-02 ENCOUNTER — Telehealth: Payer: Self-pay

## 2018-02-02 NOTE — Telephone Encounter (Signed)
Son called today stating the pt has been having episodes of thinking stems are growing out of his arms he is picking at his arms making them bleed. Per son on going for the last 2-3 months. He states that when first began he saw the oncologist that did blood work, and urine the urine showed a Uti he was treated for this,but the symptoms have not gone away. She also did a ct scan which showed a spot on his head and son states the could not tell much from it as he needed it done with contrast,but was unable to do with because of kidney fx. Per Dr. Mickie Hillier we will be glad to see the pt this week. I informed the pt Son [name] and he was transferred to the front to set up an appt here.

## 2018-02-02 NOTE — Telephone Encounter (Signed)
good

## 2018-02-03 ENCOUNTER — Encounter: Payer: Self-pay | Admitting: Family Medicine

## 2018-02-03 ENCOUNTER — Ambulatory Visit (INDEPENDENT_AMBULATORY_CARE_PROVIDER_SITE_OTHER): Payer: Medicare Other | Admitting: Family Medicine

## 2018-02-03 VITALS — BP 112/72 | Ht 72.0 in | Wt 246.2 lb

## 2018-02-03 DIAGNOSIS — F060 Psychotic disorder with hallucinations due to known physiological condition: Secondary | ICD-10-CM | POA: Diagnosis not present

## 2018-02-03 DIAGNOSIS — J431 Panlobular emphysema: Secondary | ICD-10-CM

## 2018-02-03 DIAGNOSIS — J449 Chronic obstructive pulmonary disease, unspecified: Secondary | ICD-10-CM | POA: Diagnosis not present

## 2018-02-03 DIAGNOSIS — E119 Type 2 diabetes mellitus without complications: Secondary | ICD-10-CM

## 2018-02-03 DIAGNOSIS — I10 Essential (primary) hypertension: Secondary | ICD-10-CM

## 2018-02-03 DIAGNOSIS — F29 Unspecified psychosis not due to a substance or known physiological condition: Secondary | ICD-10-CM

## 2018-02-03 MED ORDER — MOMETASONE FUROATE 0.1 % EX CREA: 1.0000 "application " | TOPICAL_CREAM | Freq: Every day | CUTANEOUS | 2 refills | Status: DC

## 2018-02-03 NOTE — Progress Notes (Signed)
   Subjective:    Patient ID: Kyle [name], male    DOB: 12-04-1933, 84 y.o.   MRN: [id number]  Diabetes  He presents for his follow-up diabetic visit. He has type 2 diabetes mellitus. Risk factors for coronary artery disease include diabetes mellitus. Current diabetic treatment includes diet. He is compliant with treatment all of the time. His weight is stable. He is following a diabetic diet.    Patient states he is having trouble with his legs swelling and would like a referral to dermatology for sores on legs from his cancer medication.  Patient had brain scan recently that showed spots on brain. Dr biddim ordered brain scan, at [facility], pt had uti, gave him cipro, then they did brain scan to make sure it was not cancer, then they did brain scan to make sure things ok   Patient is seeing things at times-sees stems coming out of his legs but no one else sees them -memory seems pretty good per son  Has appt with dr Denna Haggard, not until august,    Patient claims compliance with diabetes medication. No obvious side effects. Reports no substantial low sugar spells. Most numbers are generally in good range when checked fasting. Generally does not miss a dose of medication. Watching diabetic diet closely  Blood pressure medicine and blood pressure levels reviewed today with patient. Compliant with blood pressure medicine. States does not miss a dose. No obvious side effects. Blood pressure generally good when checked elsewhere. Watching salt intake.       Review of Systems No headache, no major weight loss or weight gain, no chest pain no back pain abdominal pain no change in bowel habits complete ROS otherwise negative     Objective:   Physical Exam  Alert and oriented, vitals reviewed and stable, NAD ENT-TM's and ext canals WNL bilat via otoscopic exam Soft palate, tonsils and post pharynx WNL via oropharyngeal exam Neck-symmetric, no masses; thyroid nonpalpable and  nontender Pulmonary-no tachypnea or accessory muscle use; Clear without wheezes via auscultation Card--no abnrml murmurs, rhythm reg and rate WNL Carotid pulses symmetric, without bruits Testing legs reveal bilateral venous stasis dermatitis with secondary excoriations and some crusting/patient adamant throughout exam this point out mysterious "stems" that no one else can see.      Assessment & Plan:  Impression1 isolated psychotic thinking with patient thinking problem #2 are "stems" growing out of his legs.  No one else can see.  He calls them out.  And they are causing him much mental distress.  Also stressed on his wife disease constantly discrete and nondisplaced and causing bleeding.  2.  Venous stasis dermatitis with secondary excoriations scraped bleeding scabs  3.  Type 2 diabetes good control discussed maintain same  4.  Hypertension good control discussed  5.  MMSE testing patient today scores well on this.  Missing only 2 points.  Thankfully no evidence of dementia.  We will switch to Elocon cream.  Maintain other chronic medications.  Honor patient's request to see dermatologist.  Discussed the great length of very isolated psychotic thinking with no other difficulties with hallucinations or delusions would not recommend atypical antipsychotics at this point.  Rationale discussed with family, primarily patient still functioning and risk of medications include premature death.  Greater than 50% of this 40  minute face to face visit was spent in counseling and discussion and coordination of care regarding the above diagnosis/diagnosies

## 2018-02-08 ENCOUNTER — Ambulatory Visit: Payer: Medicare Other | Admitting: Family Medicine

## 2018-02-10 ENCOUNTER — Ambulatory Visit: Payer: Medicare Other | Admitting: Family Medicine

## 2018-02-10 DIAGNOSIS — C672 Malignant neoplasm of lateral wall of bladder: Secondary | ICD-10-CM | POA: Diagnosis not present

## 2018-02-10 DIAGNOSIS — R5383 Other fatigue: Secondary | ICD-10-CM | POA: Diagnosis not present

## 2018-02-10 DIAGNOSIS — Z936 Other artificial openings of urinary tract status: Secondary | ICD-10-CM | POA: Diagnosis not present

## 2018-02-10 DIAGNOSIS — G939 Disorder of brain, unspecified: Secondary | ICD-10-CM | POA: Diagnosis not present

## 2018-02-10 DIAGNOSIS — C661 Malignant neoplasm of right ureter: Secondary | ICD-10-CM | POA: Diagnosis not present

## 2018-02-10 DIAGNOSIS — C679 Malignant neoplasm of bladder, unspecified: Secondary | ICD-10-CM | POA: Diagnosis not present

## 2018-02-10 DIAGNOSIS — Z466 Encounter for fitting and adjustment of urinary device: Secondary | ICD-10-CM | POA: Diagnosis not present

## 2018-02-10 DIAGNOSIS — J432 Centrilobular emphysema: Secondary | ICD-10-CM | POA: Diagnosis not present

## 2018-02-11 ENCOUNTER — Telehealth: Payer: Self-pay | Admitting: Family Medicine

## 2018-02-11 DIAGNOSIS — F29 Unspecified psychosis not due to a substance or known physiological condition: Secondary | ICD-10-CM

## 2018-02-11 DIAGNOSIS — F060 Psychotic disorder with hallucinations due to known physiological condition: Secondary | ICD-10-CM

## 2018-02-11 NOTE — Telephone Encounter (Signed)
Wife is not out patient but states she doesn't know what to do or how to handle him -he is still having delusions about his rash

## 2018-02-11 NOTE — Telephone Encounter (Signed)
Patient wife stating she needs some type of counseling to help her with her husband condition with this rash. He states is still thinks something else is going on with the rash.

## 2018-02-11 NOTE — Telephone Encounter (Signed)
Referral ordered in Epic. Wife(DPR) notified

## 2018-02-11 NOTE — Telephone Encounter (Signed)
m h rferral for pt (not wife)

## 2018-02-15 ENCOUNTER — Encounter: Payer: Self-pay | Admitting: Family Medicine

## 2018-02-15 ENCOUNTER — Encounter (INDEPENDENT_AMBULATORY_CARE_PROVIDER_SITE_OTHER): Payer: Self-pay

## 2018-02-20 DIAGNOSIS — N2581 Secondary hyperparathyroidism of renal origin: Secondary | ICD-10-CM | POA: Diagnosis not present

## 2018-02-20 DIAGNOSIS — I10 Essential (primary) hypertension: Secondary | ICD-10-CM | POA: Diagnosis not present

## 2018-02-20 DIAGNOSIS — N184 Chronic kidney disease, stage 4 (severe): Secondary | ICD-10-CM | POA: Diagnosis not present

## 2018-02-20 DIAGNOSIS — E559 Vitamin D deficiency, unspecified: Secondary | ICD-10-CM | POA: Diagnosis not present

## 2018-02-24 ENCOUNTER — Other Ambulatory Visit: Payer: Self-pay | Admitting: Dermatology

## 2018-02-24 DIAGNOSIS — Z79899 Other long term (current) drug therapy: Secondary | ICD-10-CM | POA: Diagnosis not present

## 2018-02-24 DIAGNOSIS — I714 Abdominal aortic aneurysm, without rupture: Secondary | ICD-10-CM | POA: Diagnosis not present

## 2018-02-24 DIAGNOSIS — Z8551 Personal history of malignant neoplasm of bladder: Secondary | ICD-10-CM | POA: Diagnosis not present

## 2018-02-24 DIAGNOSIS — L309 Dermatitis, unspecified: Secondary | ICD-10-CM | POA: Diagnosis not present

## 2018-02-24 DIAGNOSIS — D631 Anemia in chronic kidney disease: Secondary | ICD-10-CM | POA: Diagnosis not present

## 2018-02-24 DIAGNOSIS — E559 Vitamin D deficiency, unspecified: Secondary | ICD-10-CM | POA: Diagnosis not present

## 2018-02-24 DIAGNOSIS — R600 Localized edema: Secondary | ICD-10-CM | POA: Diagnosis not present

## 2018-02-24 DIAGNOSIS — L739 Follicular disorder, unspecified: Secondary | ICD-10-CM | POA: Diagnosis not present

## 2018-02-24 DIAGNOSIS — C679 Malignant neoplasm of bladder, unspecified: Secondary | ICD-10-CM | POA: Diagnosis not present

## 2018-02-24 DIAGNOSIS — E785 Hyperlipidemia, unspecified: Secondary | ICD-10-CM | POA: Diagnosis not present

## 2018-02-24 DIAGNOSIS — N200 Calculus of kidney: Secondary | ICD-10-CM | POA: Diagnosis not present

## 2018-02-24 DIAGNOSIS — J449 Chronic obstructive pulmonary disease, unspecified: Secondary | ICD-10-CM | POA: Diagnosis not present

## 2018-02-24 DIAGNOSIS — I129 Hypertensive chronic kidney disease with stage 1 through stage 4 chronic kidney disease, or unspecified chronic kidney disease: Secondary | ICD-10-CM | POA: Diagnosis not present

## 2018-02-24 DIAGNOSIS — D485 Neoplasm of uncertain behavior of skin: Secondary | ICD-10-CM | POA: Diagnosis not present

## 2018-02-24 DIAGNOSIS — N184 Chronic kidney disease, stage 4 (severe): Secondary | ICD-10-CM | POA: Diagnosis not present

## 2018-03-06 DIAGNOSIS — J449 Chronic obstructive pulmonary disease, unspecified: Secondary | ICD-10-CM | POA: Diagnosis not present

## 2018-03-10 ENCOUNTER — Encounter: Payer: Self-pay | Admitting: Internal Medicine

## 2018-03-23 DIAGNOSIS — D631 Anemia in chronic kidney disease: Secondary | ICD-10-CM | POA: Diagnosis not present

## 2018-03-23 DIAGNOSIS — N200 Calculus of kidney: Secondary | ICD-10-CM | POA: Diagnosis not present

## 2018-03-23 DIAGNOSIS — N184 Chronic kidney disease, stage 4 (severe): Secondary | ICD-10-CM | POA: Diagnosis not present

## 2018-03-23 DIAGNOSIS — I10 Essential (primary) hypertension: Secondary | ICD-10-CM | POA: Diagnosis not present

## 2018-04-02 ENCOUNTER — Ambulatory Visit (INDEPENDENT_AMBULATORY_CARE_PROVIDER_SITE_OTHER): Payer: Medicare Other | Admitting: Family Medicine

## 2018-04-02 DIAGNOSIS — Z23 Encounter for immunization: Secondary | ICD-10-CM | POA: Diagnosis not present

## 2018-04-02 DIAGNOSIS — F060 Psychotic disorder with hallucinations due to known physiological condition: Secondary | ICD-10-CM

## 2018-04-02 DIAGNOSIS — R21 Rash and other nonspecific skin eruption: Secondary | ICD-10-CM | POA: Diagnosis not present

## 2018-04-02 DIAGNOSIS — F29 Unspecified psychosis not due to a substance or known physiological condition: Secondary | ICD-10-CM

## 2018-04-02 DIAGNOSIS — Z936 Other artificial openings of urinary tract status: Secondary | ICD-10-CM | POA: Diagnosis not present

## 2018-04-02 NOTE — Progress Notes (Signed)
   Subjective:    Patient ID: Kyle [name], male    DOB: October 22, 1933, 84 y.o.   MRN: [id number]  HPI  Patient arrives with ongoing issues with stuff growing out of his leg. Patient saw the dermatologist and was giving zyprexa 2.5 mg every day-dx with delusions of paracytosis.  Pt saw dr beaverrs,  Pt notes overall his sin has improve  Pt notes body is still putting out "springs or vines or stems" seems like the right breast is putting this out  Dr tafeen saw pt and "wasn't sure" and went with a refera to drs at [facility]       Review of Systems No headache, no major weight loss or weight gain, no chest pain no back pain abdominal pain no change in bowel habits complete ROS otherwise negative     Objective:   Physical Exam Alert vitals stable, NAD. Blood pressure good on repeat. HEENT normal. Lungs clear. Heart regular rate and rhythm. Skin multiple excoriations both legs.  Lesions noted on arms.       Assessment & Plan:  1 impression rash.  Please see prior note.  Discussed at length with patient.  Discussed at length with family.  Patient now is on his second dermatologist and intending to see a third.  Started on Zyprexa.  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  CR prior notes.  Patient experiencing frank hallucinations in this regard to my view.  Curiously now the patient calls them "vines" emanating from his right breast area.  I do not feel represents true dermatological disease.  Will allow dermatologist to do the referral for evaluation and work-up.  Follow up with [place] as scheduled multiple questions answered  Greater than 50% of this 25 minute face to face visit was spent in counseling and discussion and coordination of care regarding the above diagnosis/diagnosies

## 2018-04-06 DIAGNOSIS — J449 Chronic obstructive pulmonary disease, unspecified: Secondary | ICD-10-CM | POA: Diagnosis not present

## 2018-04-07 DIAGNOSIS — Z436 Encounter for attention to other artificial openings of urinary tract: Secondary | ICD-10-CM | POA: Diagnosis not present

## 2018-04-07 DIAGNOSIS — N1339 Other hydronephrosis: Secondary | ICD-10-CM | POA: Diagnosis not present

## 2018-04-14 DIAGNOSIS — G939 Disorder of brain, unspecified: Secondary | ICD-10-CM | POA: Diagnosis not present

## 2018-04-14 DIAGNOSIS — C672 Malignant neoplasm of lateral wall of bladder: Secondary | ICD-10-CM | POA: Diagnosis not present

## 2018-04-19 ENCOUNTER — Encounter: Payer: Self-pay | Admitting: Family Medicine

## 2018-04-19 ENCOUNTER — Ambulatory Visit (INDEPENDENT_AMBULATORY_CARE_PROVIDER_SITE_OTHER): Payer: Medicare Other | Admitting: Family Medicine

## 2018-04-19 VITALS — BP 140/80 | Temp 98.4°F | Ht 72.0 in | Wt 243.0 lb

## 2018-04-19 DIAGNOSIS — R21 Rash and other nonspecific skin eruption: Secondary | ICD-10-CM

## 2018-04-19 DIAGNOSIS — M545 Low back pain, unspecified: Secondary | ICD-10-CM

## 2018-04-19 DIAGNOSIS — R300 Dysuria: Secondary | ICD-10-CM

## 2018-04-19 DIAGNOSIS — F29 Unspecified psychosis not due to a substance or known physiological condition: Secondary | ICD-10-CM

## 2018-04-19 DIAGNOSIS — F060 Psychotic disorder with hallucinations due to known physiological condition: Secondary | ICD-10-CM

## 2018-04-19 LAB — POCT URINALYSIS DIPSTICK
Glucose, UA: POSITIVE — AB
PH UA: 7 (ref 5.0–8.0)
Protein, UA: POSITIVE — AB
Spec Grav, UA: 1.015 (ref 1.010–1.025)

## 2018-04-19 MED ORDER — TIZANIDINE HCL 4 MG PO TABS: 4.0000 mg | ORAL_TABLET | Freq: Three times a day (TID) | ORAL | 0 refills | Status: DC | PRN

## 2018-04-19 NOTE — Progress Notes (Signed)
   Subjective:    Patient ID: Kyle [name], male    DOB: Mar 17, 1934, 84 y.o.   MRN: [id number]  HPI Patient is here today with complaints of lower left side back pain.On going for the last 4-5 days. No burning nor stinging. He states he thinks it is muscular. Pain is worse with certain movements.  Primarily in the lumbar region.  Minimal radiation into the legs.  Patient was perhaps having no obvious urinary symptoms.  Our nurse went ahead and did a urinary specimen.  Patient has been advised to the urologist not to take antibiotics for pyuria.  He has chronic presence of UTI.  No fever no chills no dysuria  Patient went into a lengthy description of these "strains" that are emanating from his body.  Please see prior notes.  Patient wanted to show me some.  Asked if I had any scissors that would help get them off of the skin.  Review of Systems Results for orders placed or performed in visit on 04/19/18  POCT urinalysis dipstick  Result Value Ref Range   Color, UA     Clarity, UA     Glucose, UA Positive (A) Negative   Bilirubin, UA     Ketones, UA     Spec Grav, UA 1.015 1.010 - 1.025   Blood, UA 1+    pH, UA 7.0 5.0 - 8.0   Protein, UA Positive (A) Negative   Urobilinogen, UA     Nitrite, UA     Leukocytes, UA Large (3+) (A) Negative   Appearance     Odor         Objective:   Physical Exam  Alert and oriented, vitals reviewed and stable, NAD ENT-TM's and ext canals WNL bilat via otoscopic exam Soft palate, tonsils and post pharynx WNL via oropharyngeal exam Neck-symmetric, no masses; thyroid nonpalpable and nontender Pulmonary-no tachypnea or accessory muscle use; Clear without wheezes via auscultation Card--no abnrml murmurs, rhythm reg and rate WNL Carotid pulses symmetric, without bruits Diffuse low lumbar tenderness to palpation.  Negative spinal tenderness.  Negative straight leg raise      Assessment & Plan:  Impression low back pain.  Potential element of  spasm.  Discussed.  Add Zanaflex as needed.  2.  Pyuria.  Will hold off on treatment to honor urologist wishes and I think likely a good idea since nonsymptomatic  3.  Skin issues.  See prior note.  There is a selective mental component regarding this.  Patient otherwise appears to be functioning okay.  Currently under therapy by dermatologists  Greater than 50% of this 25 minute face to face visit was spent in counseling and discussion and coordination of care regarding the above diagnosis/diagnosies

## 2018-04-23 ENCOUNTER — Other Ambulatory Visit: Payer: Self-pay | Admitting: Family Medicine

## 2018-05-06 ENCOUNTER — Ambulatory Visit: Payer: Medicare Other | Admitting: Family Medicine

## 2018-05-06 DIAGNOSIS — J449 Chronic obstructive pulmonary disease, unspecified: Secondary | ICD-10-CM | POA: Diagnosis not present

## 2018-05-07 ENCOUNTER — Encounter: Payer: Self-pay | Admitting: Family Medicine

## 2018-05-07 ENCOUNTER — Ambulatory Visit (INDEPENDENT_AMBULATORY_CARE_PROVIDER_SITE_OTHER): Payer: Medicare Other | Admitting: Family Medicine

## 2018-05-07 VITALS — BP 132/70 | Ht 72.0 in | Wt 243.8 lb

## 2018-05-07 DIAGNOSIS — I10 Essential (primary) hypertension: Secondary | ICD-10-CM | POA: Diagnosis not present

## 2018-05-07 DIAGNOSIS — E785 Hyperlipidemia, unspecified: Secondary | ICD-10-CM

## 2018-05-07 DIAGNOSIS — J431 Panlobular emphysema: Secondary | ICD-10-CM

## 2018-05-07 DIAGNOSIS — E119 Type 2 diabetes mellitus without complications: Secondary | ICD-10-CM | POA: Diagnosis not present

## 2018-05-07 LAB — POCT GLYCOSYLATED HEMOGLOBIN (HGB A1C): Hemoglobin A1C: 5.1 % (ref 4.0–5.6)

## 2018-05-07 NOTE — Progress Notes (Signed)
   Subjective:    Patient ID: Kyle [name], male    DOB: Sep 11, 1933, 84 y.o.   MRN: [id number]  Diabetes  He presents for his follow-up diabetic visit. He has type 2 diabetes mellitus. There are no hypoglycemic associated symptoms. There are no diabetic associated symptoms. There are no hypoglycemic complications. There are no diabetic complications.   Results for orders placed or performed in visit on 05/07/18  POCT HgB A1C  Result Value Ref Range   Hemoglobin A1C 5.1 4.0 - 5.6 %   HbA1c POC (<> result, manual entry)     HbA1c, POC (prediabetic range)     HbA1c, POC (controlled diabetic range)     Pt notes more sob, lots of congestion kicked in last couple weels.  Claims compliance with his pulmonary medications.  No major productive cough.  No fever no chills.  .diab  Patient claims compliance with diabetes medication. No obvious side effects. Reports no substantial low sugar spells. Most numbers are generally in good range when checked fasting. Generally does not miss a dose of medication. Watching diabetic diet closely  Patient's family reports excessive sedation with Zanaflex.  Discussed.  They would like to hold off on it.  Quite drowsy takes it.  Patient notes ongoing compliance with antidepressant medication. No obvious side effects. Reports does not miss a dose. Overall continues to help depression substantially. No thoughts of homicide or suicide. Would like to maintain medication.   Blood pressure medicine and blood pressure levels reviewed today with patient. Compliant with blood pressure medicine. States does not miss a dose. No obvious side effects. Blood pressure generally good when checked elsewhere. Watching salt intake.           Review of Systems No headache, no major weight loss or weight gain, no chest pain no back pain abdominal pain no change in bowel habits complete ROS otherwise negative     Objective:   Physical Exam  Alert and oriented, vitals  reviewed and stable, NAD ENT-TM's and ext canals WNL bilat via otoscopic exam Soft palate, tonsils and post pharynx WNL via oropharyngeal exam Neck-symmetric, no masses; thyroid nonpalpable and nontender Pulmonary-no tachypnea or accessory muscle use; Clear without wheezes via auscultation Card--no abnrml murmurs, rhythm reg and rate WNL Carotid pulses symmetric, without bruits       Assessment & Plan:  Impression type 2 diabetes.  Control excellent.  Discussed to maintain same  2.  Chronic emphysema.  Ongoing.  Mild flare not enough to warrant antibiotics at this time discussed  3.  Hypertension.  Good control discussed to maintain same  4.  Medication side effect.  Zanaflex causing sedation.  Stop rationale discussed  Medications refilled./Diet exercise discussed follow-up in several months

## 2018-05-10 DIAGNOSIS — N200 Calculus of kidney: Secondary | ICD-10-CM | POA: Diagnosis not present

## 2018-05-10 DIAGNOSIS — D631 Anemia in chronic kidney disease: Secondary | ICD-10-CM | POA: Diagnosis not present

## 2018-05-10 DIAGNOSIS — N184 Chronic kidney disease, stage 4 (severe): Secondary | ICD-10-CM | POA: Diagnosis not present

## 2018-05-10 DIAGNOSIS — I10 Essential (primary) hypertension: Secondary | ICD-10-CM | POA: Diagnosis not present

## 2018-05-12 DIAGNOSIS — C661 Malignant neoplasm of right ureter: Secondary | ICD-10-CM | POA: Diagnosis not present

## 2018-05-12 DIAGNOSIS — C676 Malignant neoplasm of ureteric orifice: Secondary | ICD-10-CM | POA: Diagnosis not present

## 2018-05-12 DIAGNOSIS — I2510 Atherosclerotic heart disease of native coronary artery without angina pectoris: Secondary | ICD-10-CM | POA: Diagnosis not present

## 2018-05-12 DIAGNOSIS — I714 Abdominal aortic aneurysm, without rupture: Secondary | ICD-10-CM | POA: Diagnosis not present

## 2018-05-12 DIAGNOSIS — C679 Malignant neoplasm of bladder, unspecified: Secondary | ICD-10-CM | POA: Diagnosis not present

## 2018-05-12 DIAGNOSIS — C672 Malignant neoplasm of lateral wall of bladder: Secondary | ICD-10-CM | POA: Diagnosis not present

## 2018-05-12 DIAGNOSIS — J449 Chronic obstructive pulmonary disease, unspecified: Secondary | ICD-10-CM | POA: Diagnosis not present

## 2018-05-12 DIAGNOSIS — R238 Other skin changes: Secondary | ICD-10-CM | POA: Diagnosis not present

## 2018-05-12 DIAGNOSIS — K439 Ventral hernia without obstruction or gangrene: Secondary | ICD-10-CM | POA: Diagnosis not present

## 2018-05-19 DIAGNOSIS — Z466 Encounter for fitting and adjustment of urinary device: Secondary | ICD-10-CM | POA: Diagnosis not present

## 2018-05-19 DIAGNOSIS — C661 Malignant neoplasm of right ureter: Secondary | ICD-10-CM | POA: Diagnosis not present

## 2018-05-20 DIAGNOSIS — L0100 Impetigo, unspecified: Secondary | ICD-10-CM | POA: Diagnosis not present

## 2018-05-25 DIAGNOSIS — D631 Anemia in chronic kidney disease: Secondary | ICD-10-CM | POA: Diagnosis not present

## 2018-05-25 DIAGNOSIS — N200 Calculus of kidney: Secondary | ICD-10-CM | POA: Diagnosis not present

## 2018-05-25 DIAGNOSIS — I10 Essential (primary) hypertension: Secondary | ICD-10-CM | POA: Diagnosis not present

## 2018-05-25 DIAGNOSIS — N184 Chronic kidney disease, stage 4 (severe): Secondary | ICD-10-CM | POA: Diagnosis not present

## 2018-06-01 ENCOUNTER — Telehealth: Payer: Self-pay | Admitting: Oncology

## 2018-06-01 ENCOUNTER — Encounter: Payer: Self-pay | Admitting: Oncology

## 2018-06-01 DIAGNOSIS — Z85118 Personal history of other malignant neoplasm of bronchus and lung: Secondary | ICD-10-CM | POA: Diagnosis not present

## 2018-06-01 DIAGNOSIS — Z79899 Other long term (current) drug therapy: Secondary | ICD-10-CM | POA: Diagnosis not present

## 2018-06-01 DIAGNOSIS — I129 Hypertensive chronic kidney disease with stage 1 through stage 4 chronic kidney disease, or unspecified chronic kidney disease: Secondary | ICD-10-CM | POA: Diagnosis not present

## 2018-06-01 DIAGNOSIS — N184 Chronic kidney disease, stage 4 (severe): Secondary | ICD-10-CM | POA: Diagnosis not present

## 2018-06-01 DIAGNOSIS — E1122 Type 2 diabetes mellitus with diabetic chronic kidney disease: Secondary | ICD-10-CM | POA: Diagnosis not present

## 2018-06-01 DIAGNOSIS — Z87891 Personal history of nicotine dependence: Secondary | ICD-10-CM | POA: Diagnosis not present

## 2018-06-01 DIAGNOSIS — Z8551 Personal history of malignant neoplasm of bladder: Secondary | ICD-10-CM | POA: Diagnosis not present

## 2018-06-01 DIAGNOSIS — N200 Calculus of kidney: Secondary | ICD-10-CM | POA: Diagnosis not present

## 2018-06-01 DIAGNOSIS — D631 Anemia in chronic kidney disease: Secondary | ICD-10-CM | POA: Diagnosis not present

## 2018-06-01 DIAGNOSIS — E559 Vitamin D deficiency, unspecified: Secondary | ICD-10-CM | POA: Diagnosis not present

## 2018-06-01 DIAGNOSIS — G4733 Obstructive sleep apnea (adult) (pediatric): Secondary | ICD-10-CM | POA: Diagnosis not present

## 2018-06-01 DIAGNOSIS — N2581 Secondary hyperparathyroidism of renal origin: Secondary | ICD-10-CM | POA: Diagnosis not present

## 2018-06-01 DIAGNOSIS — J449 Chronic obstructive pulmonary disease, unspecified: Secondary | ICD-10-CM | POA: Diagnosis not present

## 2018-06-01 DIAGNOSIS — R310 Gross hematuria: Secondary | ICD-10-CM | POA: Diagnosis not present

## 2018-06-01 DIAGNOSIS — E785 Hyperlipidemia, unspecified: Secondary | ICD-10-CM | POA: Diagnosis not present

## 2018-06-01 NOTE — Telephone Encounter (Signed)
Received a call from Dr. Thera Flake office at [facility] to transfer the pt care to our facility. Pt has been scheduled to see Dr. Alen Blew on 11/19 at 2pm. Referring office will notify the pt's son of the appt.

## 2018-06-03 DIAGNOSIS — Z436 Encounter for attention to other artificial openings of urinary tract: Secondary | ICD-10-CM | POA: Diagnosis not present

## 2018-06-03 DIAGNOSIS — C661 Malignant neoplasm of right ureter: Secondary | ICD-10-CM | POA: Diagnosis not present

## 2018-06-03 DIAGNOSIS — Z466 Encounter for fitting and adjustment of urinary device: Secondary | ICD-10-CM | POA: Diagnosis not present

## 2018-06-06 DIAGNOSIS — J449 Chronic obstructive pulmonary disease, unspecified: Secondary | ICD-10-CM | POA: Diagnosis not present

## 2018-06-08 ENCOUNTER — Inpatient Hospital Stay: Payer: No Typology Code available for payment source | Attending: Oncology | Admitting: Oncology

## 2018-06-08 ENCOUNTER — Telehealth: Payer: Self-pay

## 2018-06-08 VITALS — BP 121/66 | HR 76 | Temp 97.8°F | Resp 17 | Ht 72.0 in | Wt 246.6 lb

## 2018-06-08 DIAGNOSIS — C679 Malignant neoplasm of bladder, unspecified: Secondary | ICD-10-CM

## 2018-06-08 DIAGNOSIS — C3491 Malignant neoplasm of unspecified part of right bronchus or lung: Secondary | ICD-10-CM

## 2018-06-08 DIAGNOSIS — Z9221 Personal history of antineoplastic chemotherapy: Secondary | ICD-10-CM

## 2018-06-08 DIAGNOSIS — N1330 Unspecified hydronephrosis: Secondary | ICD-10-CM

## 2018-06-08 DIAGNOSIS — Z79899 Other long term (current) drug therapy: Secondary | ICD-10-CM

## 2018-06-08 DIAGNOSIS — I10 Essential (primary) hypertension: Secondary | ICD-10-CM

## 2018-06-08 DIAGNOSIS — F17210 Nicotine dependence, cigarettes, uncomplicated: Secondary | ICD-10-CM

## 2018-06-08 DIAGNOSIS — D696 Thrombocytopenia, unspecified: Secondary | ICD-10-CM

## 2018-06-08 NOTE — Progress Notes (Signed)
Reason for the request: Advanced urothelial carcinoma of the bladder.  HPI: I was asked by Dr. Wolfgang Phoenix to evaluate Kyle [name] for urothelial carcinoma.  He is an 84 year old man with a history of urothelial carcinoma of the urethra diagnosed in 2017.  He received definitive radiation therapy with Xeloda under the care of Dr. Barton Dubois at the time.  He subsequently developed hematuria in April 2018 and imaging study showed a right-sided ureteral obstruction and hydronephrosis and a biopsy showed high-grade urothelial carcinoma.  He establish care with Dr. Thera Flake and was started on Pembrolizumab starting on Nov 19, 2016.  He was treated with Pembrolizumab until around April 2019.  Therapy was withheld because of pruritus and decline in his performance status and has been on active surveillance since that time.  He underwent a CT scan of the chest, abdomen and pelvis on May 12, 2018 without contrast and did not show any evidence of advanced disease.  Patient referred to me to reestablish care with oncology locally.  Clinically, continues to have issues with pruritus which is managed by Benadryl.  He does report some mild constipation but overall reasonable quality of life.  His ambulation is limited with the help of a walker and a cane without any recent falls.  He denies any pelvic discomfort or hematuria.  He has a right nephrostomy tube in place was exchanged for few weeks ago.   He does not report any headaches, blurry vision, syncope or seizures. Does not report any fevers, chills or sweats.  Does not report any cough, wheezing or hemoptysis.  Does not report any chest pain, palpitation, orthopnea or leg edema.  Does not report any nausea, vomiting or abdominal pain.  Does not report any constipation or diarrhea.  Does not report any skeletal complaints.    Does not report frequency, urgency or hematuria.  Does not report any skin rashes or lesions. Does not report any heat or cold intolerance.  Does not  report any lymphadenopathy or petechiae.  Does not report any anxiety or depression.  Remaining review of systems is negative.    Past Medical History:  Diagnosis Date  . AAA (abdominal aortic aneurysm) ([place]) 2004   s/p repair 2004; 4.3 cm infrarenal in 05/2011  . Abnormality of gait 02/23/2013  . Adenocarcinoma of right lung ([place]) 02/24/2011   Ct A/P 2012:  2cm lung mass RLL PET 2706:  Hypermetabolic RLL mass, no other hypermetabolic areas. TTNA 02/2011:  Adenocarcinoma, markers c/w lung origin Right lower lobe superior segmentectomy. 04/01/2011 Dr. Arlyce Dice   . Adenocarcinoma, lung ([place]) 01/2011   transthoracic FNA; resection of the superior segment of the RLL in 03/2011; negative nodes; no chemotherapy nor radiation planned  . Arm fracture    right arm  . Arteriosclerotic cardiovascular disease (ASCVD) 1973, 12/2010   S/P NSTEMI secondary to distal RCA/PL lesion, tx medically.  EF of  55%-60% per  echo.  . Benign prostatic hypertrophy    s/p transurethral resection of the prostate  . Bilateral renal masses    Cystic, more prominent on CT in 12/2010 than 2007; followed by Dr. Rosana Hoes  . Bladder cancer [facility]) 1996   Transurethral resection of the bladder + chemotherapy/BCG as premed  . Cataract   . Chronic kidney disease    Creatinine 1.4 on discharge 12/20/2100; proteinuria; normal renal ultrasound in 2010; recent creatinines of 1.7-2.; Bilateral cystic renal masses by CT in 2011  . COPD (chronic obstructive pulmonary disease) ([place])   . Coronary artery disease   .  Cough    thick phlegm  . Diabetes mellitus    Type II  . Diplopia 02/23/2013  . Diverticulosis   . Essential and other specified forms of tremor 02/23/2013  . Hx of Clostridium difficile infection   . Hyperlipidemia   . Hypertension   . Insomnia   . ITP (idiopathic thrombocytopenic purpura) 09/07/2012   Chronic ITP of adults versus medication-induced ITP.  Stable  . Myocardial infarction ([place])   . Nephrolithiasis 2012   ARF in 01/2011  due to obstructing nephrolithiasis  . Obesity   . OSA (obstructive sleep apnea)   . Polyneuropathy in diabetes(357.2) 02/23/2013  . Thrombocytopenia ([place])   . Tobacco abuse    50-pack-year consumption; quit in 12/2010  . Tubular adenoma of colon   . Ventral hernia   :  Past Surgical History:  Procedure Laterality Date  . ABDOMINAL AORTIC ANEURYSM REPAIR  2004  . CARDIAC CATHETERIZATION    . COLONOSCOPY  03/19/2010   Dr. Gala Romney -(poor prep) Anal papilla, rectal hyperplastic polyp, tubular adenoma removed splenic flexure, left-sided diverticula  . COLONOSCOPY  11/28/2004   RMR:  Diminutive rectal and left colon polyps as described above, cold  biopsied/removed/  Left sided diverticula. The remainder of the colonic mucosa appeared normal.  . COLONOSCOPY   09/15/01   RMR: Multiple diminutive polyps destroyed with dermolysis as described above/ Multiple small polyps on stalks in the colon resected with snare cautery/ Scattered pan colonic diverticulum/ The remainder of the colonic mucosa appeared normal  . COLONOSCOPY N/A 05/01/2015   Procedure: COLONOSCOPY;  Surgeon: Daneil Dolin, MD;  Location: [facility];  Service: Endoscopy;  Laterality: N/A;  1115  . CYSTECTOMY    . CYSTOSCOPY  04/2014  . CYSTOSTOMY W/ BLADDER BIOPSY    . FLEXIBLE SIGMOIDOSCOPY  2014   Dr. Olevia Perches: tubular adenoma, negative stool studies   . LUNG LOBECTOMY    . TONSILLECTOMY    . TRANSURETHRAL RESECTION OF PROSTATE    . VIDEO BRONCHOSCOPY WITH ENDOBRONCHIAL NAVIGATION N/A 10/10/2013   Procedure: VIDEO BRONCHOSCOPY WITH ENDOBRONCHIAL NAVIGATION;  Surgeon: Melrose Nakayama, MD;  Location: [facility];  Service: Thoracic;  Laterality: N/A;  NO BLOOD THINNERS BUT PATIENT HAS ITP  . WEDGE RESECTION  04/2011   carcinoma of lung  :   Current Outpatient Medications:  .  acetaminophen (TYLENOL) 500 MG tablet, Take 500-1,000 mg by mouth at bedtime., Disp: , Rfl:  .  ALPRAZolam (XANAX) 0.5 MG tablet, TAKE ONE TABLET TWICE  DAILY AS NEEDED FOR ANXIETY., Disp: 60 tablet, Rfl: 5 .  aspirin EC 81 MG tablet, Take 81 mg by mouth daily., Disp: , Rfl:  .  cephALEXin (KEFLEX) 250 MG capsule, Take 1 capsule (250 mg total) by mouth 3 (three) times daily., Disp: 30 capsule, Rfl: 0 .  cetirizine (ZYRTEC) 10 MG tablet, Take 1 tablet (10 mg total) by mouth daily., Disp: 30 tablet, Rfl: 5 .  cholecalciferol (VITAMIN D) 1000 units tablet, Take 2,000 Units by mouth daily., Disp: , Rfl:  .  citalopram (CELEXA) 20 MG tablet, TAKE ONE (1) TABLET BY MOUTH EVERY DAY, Disp: 90 tablet, Rfl: 1 .  fluticasone (FLONASE) 50 MCG/ACT nasal spray, Place 1 spray into both nostrils daily., Disp: 16 g, Rfl: 5 .  metoprolol tartrate (LOPRESSOR) 50 MG tablet, TAKE ONE-HALF TABLET BY MOUTH TWICE DAILY, Disp: 90 tablet, Rfl: 1 .  mometasone (ELOCON) 0.1 % cream, Apply 1 application topically daily. Affected area, Disp: 60 g, Rfl: 2 .  niacin (NIASPAN) 500 MG CR tablet, Take 2 tablets (1,000 mg total) by mouth at bedtime., Disp: 60 tablet, Rfl: 5 .  nitroGLYCERIN (NITROSTAT) 0.4 MG SL tablet, Place 1 tablet (0.4 mg total) under the tongue every 5 (five) minutes as needed. Call MD if need more than 2 (Patient taking differently: Place 0.4 mg under the tongue every 5 (five) minutes as needed for chest pain. Call MD if need more than 2), Disp: 3 tablet, Rfl: 0 .  nitroGLYCERIN (NITROSTAT) 0.4 MG SL tablet, PLACE ONE TABLET UNDER THE TONGUE EVERY 5 MINUTES AS NEEDED. CALL MD IF NEED MORE THAN 2., Disp: 25 tablet, Rfl: 2 .  OLANZapine (ZYPREXA) 2.5 MG tablet, Take 2.5 mg by mouth daily., Disp: , Rfl:  .  Pembrolizumab (KEYTRUDA IV), Inject into the vein every 28 (twenty-eight) days. At [facility] , Disp: , Rfl:  .  pravastatin (PRAVACHOL) 80 MG tablet, TAKE ONE (1) TABLET EACH DAY, Disp: 90 tablet, Rfl: 1 .  PROAIR HFA 108 (90 Base) MCG/ACT inhaler, INHALE TWO PUFFS EVERY 6 HOURS AS NEEDED, Disp: 8.5 g, Rfl: 0 .  Probiotic Product (PROBIOTIC PEARLS ADVANTAGE) CAPS,  Take 1 capsule by mouth daily., Disp: , Rfl:  .  SPIRIVA HANDIHALER 18 MCG inhalation capsule, PLACE ONE CAPSULE INTO INHALER AND INHALE DAILY, Disp: 90 capsule, Rfl: 3 .  tiotropium (SPIRIVA) 18 MCG inhalation capsule, Place 18 mcg into inhaler and inhale daily., Disp: , Rfl:  .  vitamin B-12 1000 MCG tablet, Take 1 tablet (1,000 mcg total) by mouth daily., Disp: , Rfl: :  Allergies  Allergen Reactions  . Codeine Anaphylaxis  . Etodolac Other (See Comments)    dizziness  . Zanaflex [Tizanidine Hcl] Other (See Comments)    Drowsiness  :  Family History  Problem Relation Age of Onset  . Aortic aneurysm Mother   . Emphysema Father        smoker  . Clotting disorder Father   . Arthritis Father   . Hypertension Father   . Diabetes Father   . Aortic aneurysm Father   . Tremor Father   . Stroke Paternal Grandmother   . Other Paternal Grandfather        brain aneurysm  . Colon cancer Cousin   :  Social History   Socioeconomic History  . Marital status: Married    Spouse name: Kyle [name]   . Number of children: 2  . Years of education: 12+  . Highest education level: Not on file  Occupational History  . Occupation: Retired     Fish farm manager: [employer]  Social Needs  . Financial resource strain: Not on file  . Food insecurity:    Worry: Not on file    Inability: Not on file  . Transportation needs:    Medical: Not on file    Non-medical: Not on file  Tobacco Use  . Smoking status: Current Every Day Smoker    Packs/day: 1.00    Years: 50.00    Pack years: 50.00    Types: Cigarettes    Start date: 07/22/1955  . Smokeless tobacco: Never Used  . Tobacco comment: "smokes a few days a week" 09/29/17  Substance and Sexual Activity  . Alcohol use: No    Alcohol/week: 0.0 standard drinks  . Drug use: No  . Sexual activity: Never  Lifestyle  . Physical activity:    Days per week: Not on file    Minutes per session: Not on file  . Stress: Not on  file  Relationships  . Social  connections:    Talks on phone: Not on file    Gets together: Not on file    Attends religious service: Not on file    Active member of club or organization: Not on file    Attends meetings of clubs or organizations: Not on file    Relationship status: Not on file  . Intimate partner violence:    Fear of current or ex partner: Not on file    Emotionally abused: Not on file    Physically abused: Not on file    Forced sexual activity: Not on file  Other Topics Concern  . Not on file  Social History Narrative   Patient lives at home with his wife Kyle [name].    Patient has 2 children.    Patient is retired.    Patient is right handed.    Patient has 2 years of college.    3-4 cups of caffeine daily.  :  Pertinent items are noted in HPI.  Physical exam: Blood pressure 121/66, pulse 76, temperature 97.8 F (36.6 C), temperature source Oral, resp. rate 17, height 6' (1.829 m), weight 246 lb 9.6 oz (111.9 kg), SpO2 94 %.   ECOG 2  General appearance: alert and cooperative appeared without distress. Head: atraumatic without any abnormalities. Eyes: conjunctivae/corneas clear. PERRL.  Sclera anicteric. Throat: lips, mucosa, and tongue normal; without oral thrush or ulcers. Resp: clear to auscultation bilaterally without rhonchi, wheezes or dullness to percussion. Cardio: regular rate and rhythm, S1, S2 normal, no murmur, click, rub or gallop GI: soft, non-tender; bowel sounds normal; no masses,  no organomegaly Skin: Skin color, texture, turgor normal. No rashes or lesions Lymph nodes: Cervical, supraclavicular, and axillary nodes normal. Neurologic: Grossly normal without any motor, sensory or deep tendon reflexes. Musculoskeletal: No joint deformity or effusion.  CBC    Component Value Date/Time   WBC 5.8 12/27/2017 0448   RBC 3.03 (L) 12/27/2017 0448   HGB 8.9 (L) 12/27/2017 0448   HCT 28.5 (L) 12/27/2017 0448   PLT 95 (L) 12/27/2017 0448   MCV 94.1 12/27/2017 0448   MCH  29.4 12/27/2017 0448   MCHC 31.2 12/27/2017 0448   RDW 18.1 (H) 12/27/2017 0448   LYMPHSABS 1.2 12/27/2017 0448   MONOABS 0.5 12/27/2017 0448   EOSABS 0.6 12/27/2017 0448   BASOSABS 0.0 12/27/2017 0448     Chemistry      Component Value Date/Time   NA 139 12/27/2017 0448   NA 145 (H) 11/09/2017 0945   K 4.1 12/27/2017 0448   CL 110 12/27/2017 0448   CO2 23 12/27/2017 0448   BUN 40 (H) 12/27/2017 0448   BUN 32 (H) 11/09/2017 0945   CREATININE 3.05 (H) 12/27/2017 0448   CREATININE 1.88 (H) 11/13/2011 1049      Component Value Date/Time   CALCIUM 8.9 12/27/2017 0448   CALCIUM 9.2 07/22/2016 0940   ALKPHOS 64 11/09/2017 0945   AST 7 11/09/2017 0945   ALT 8 11/09/2017 0945   BILITOT 1.2 11/09/2017 0945       Assessment and Plan:   84 year old man with the following:  1.  Locally advanced urothelial carcinoma of the bladder diagnosed in April 2018.  He presented with hematuria and hydronephrosis and was treated with Pembrolizumab as he was not a candidate for any surgical therapy.  Therapy was discontinued in April 2019 because of toxicities as well as decline in his performance status.  Imaging studies did  not show any evidence of measurable disease at this time.  The natural course of this disease was reviewed today and treatment options were discussed.  These were include continued active surveillance, restarting Pembrolizumab or systemic chemotherapy.  At this time the plan is to continue with active surveillance and repeat imaging studies in 3 months and a follow-up at that time.  He is agreeable to proceed at this time.  2.  Hydronephrosis: Percutaneous nephrostomy tube in place on the right side and exchanged periodically.  His last exchange was few weeks ago.  I will make the appropriate referrals for interventional radiology to be done locally based on his preference.  His last exchange was done at [facility].  3.  Thrombocytopenia: Appears to be  chronic in nature related to ITP versus myelodysplasia.  We will continue to monitor his counts.  4.  Follow-up: We will be in 3 months for repeat CT scan.  40  minutes was spent with the patient face-to-face today.  More than 50% of time was dedicated to reviewing his imaging studies, laboratory data, discussing his disease status and answering questions regarding future plan of care.    Thank you for the referral.  A copy of this consult has been forwarded to the requesting physician.

## 2018-06-08 NOTE — Telephone Encounter (Signed)
Printed avs and calender of [place] appointment. Per 11/19 los

## 2018-06-09 DIAGNOSIS — L299 Pruritus, unspecified: Secondary | ICD-10-CM | POA: Diagnosis not present

## 2018-06-09 DIAGNOSIS — N189 Chronic kidney disease, unspecified: Secondary | ICD-10-CM | POA: Diagnosis not present

## 2018-06-20 ENCOUNTER — Emergency Department (HOSPITAL_COMMUNITY)
Admission: EM | Admit: 2018-06-20 | Discharge: 2018-06-20 | Disposition: A | Discharge status: Home / Self Care | Payer: Medicare Other | Attending: Emergency Medicine | Admitting: Emergency Medicine

## 2018-06-20 ENCOUNTER — Emergency Department (HOSPITAL_COMMUNITY): Payer: Medicare Other

## 2018-06-20 ENCOUNTER — Encounter (HOSPITAL_COMMUNITY): Payer: Self-pay | Admitting: Emergency Medicine

## 2018-06-20 DIAGNOSIS — R05 Cough: Secondary | ICD-10-CM | POA: Diagnosis not present

## 2018-06-20 DIAGNOSIS — E1122 Type 2 diabetes mellitus with diabetic chronic kidney disease: Secondary | ICD-10-CM | POA: Diagnosis not present

## 2018-06-20 DIAGNOSIS — J441 Chronic obstructive pulmonary disease with (acute) exacerbation: Secondary | ICD-10-CM | POA: Diagnosis not present

## 2018-06-20 DIAGNOSIS — R1013 Epigastric pain: Secondary | ICD-10-CM | POA: Diagnosis not present

## 2018-06-20 DIAGNOSIS — Z85118 Personal history of other malignant neoplasm of bronchus and lung: Secondary | ICD-10-CM | POA: Insufficient documentation

## 2018-06-20 DIAGNOSIS — Z87891 Personal history of nicotine dependence: Secondary | ICD-10-CM | POA: Insufficient documentation

## 2018-06-20 DIAGNOSIS — I252 Old myocardial infarction: Secondary | ICD-10-CM | POA: Insufficient documentation

## 2018-06-20 DIAGNOSIS — N184 Chronic kidney disease, stage 4 (severe): Secondary | ICD-10-CM | POA: Diagnosis not present

## 2018-06-20 DIAGNOSIS — R0602 Shortness of breath: Secondary | ICD-10-CM | POA: Diagnosis not present

## 2018-06-20 DIAGNOSIS — Z8551 Personal history of malignant neoplasm of bladder: Secondary | ICD-10-CM | POA: Diagnosis not present

## 2018-06-20 DIAGNOSIS — R0789 Other chest pain: Secondary | ICD-10-CM | POA: Insufficient documentation

## 2018-06-20 DIAGNOSIS — Z79899 Other long term (current) drug therapy: Secondary | ICD-10-CM | POA: Insufficient documentation

## 2018-06-20 DIAGNOSIS — J440 Chronic obstructive pulmonary disease with acute lower respiratory infection: Secondary | ICD-10-CM | POA: Insufficient documentation

## 2018-06-20 DIAGNOSIS — J40 Bronchitis, not specified as acute or chronic: Secondary | ICD-10-CM

## 2018-06-20 DIAGNOSIS — I129 Hypertensive chronic kidney disease with stage 1 through stage 4 chronic kidney disease, or unspecified chronic kidney disease: Secondary | ICD-10-CM | POA: Diagnosis not present

## 2018-06-20 LAB — I-STAT ARTERIAL BLOOD GAS, ED
Acid-base deficit: 6 mmol/L — ABNORMAL HIGH (ref 0.0–2.0)
Bicarbonate: 19.3 mmol/L — ABNORMAL LOW (ref 20.0–28.0)
O2 Saturation: 90 %
PCO2 ART: 35.8 mmHg (ref 32.0–48.0)
PO2 ART: 61 mmHg — AB (ref 83.0–108.0)
Patient temperature: 98
TCO2: 20 mmol/L — ABNORMAL LOW (ref 22–32)
pH, Arterial: 7.339 — ABNORMAL LOW (ref 7.350–7.450)

## 2018-06-20 LAB — CBC WITH DIFFERENTIAL/PLATELET
Abs Immature Granulocytes: 0.03 10*3/uL (ref 0.00–0.07)
Basophils Absolute: 0 10*3/uL (ref 0.0–0.1)
Basophils Relative: 1 %
EOS ABS: 0.5 10*3/uL (ref 0.0–0.5)
EOS PCT: 8 %
HEMATOCRIT: 33.4 % — AB (ref 39.0–52.0)
Hemoglobin: 9.6 g/dL — ABNORMAL LOW (ref 13.0–17.0)
Immature Granulocytes: 1 %
LYMPHS ABS: 1.1 10*3/uL (ref 0.7–4.0)
Lymphocytes Relative: 19 %
MCH: 28.6 pg (ref 26.0–34.0)
MCHC: 28.7 g/dL — AB (ref 30.0–36.0)
MCV: 99.4 fL (ref 80.0–100.0)
MONOS PCT: 6 %
Monocytes Absolute: 0.4 10*3/uL (ref 0.1–1.0)
Neutro Abs: 3.9 10*3/uL (ref 1.7–7.7)
Neutrophils Relative %: 65 %
Platelets: 84 10*3/uL — ABNORMAL LOW (ref 150–400)
RBC: 3.36 MIL/uL — ABNORMAL LOW (ref 4.22–5.81)
RDW: 18.5 % — AB (ref 11.5–15.5)
WBC: 5.9 10*3/uL (ref 4.0–10.5)
nRBC: 0 % (ref 0.0–0.2)

## 2018-06-20 LAB — COMPREHENSIVE METABOLIC PANEL
ALT: 9 U/L (ref 0–44)
ANION GAP: 9 (ref 5–15)
AST: 9 U/L — AB (ref 15–41)
Albumin: 2.9 g/dL — ABNORMAL LOW (ref 3.5–5.0)
Alkaline Phosphatase: 50 U/L (ref 38–126)
BUN: 43 mg/dL — ABNORMAL HIGH (ref 8–23)
CHLORIDE: 113 mmol/L — AB (ref 98–111)
CO2: 21 mmol/L — AB (ref 22–32)
CREATININE: 3.46 mg/dL — AB (ref 0.61–1.24)
Calcium: 8.9 mg/dL (ref 8.9–10.3)
GFR calc Af Amer: 18 mL/min — ABNORMAL LOW (ref >60)
GFR, EST NON AFRICAN AMERICAN: 16 mL/min — AB (ref >60)
Glucose, Bld: 130 mg/dL — ABNORMAL HIGH (ref 70–99)
Potassium: 4.2 mmol/L (ref 3.5–5.1)
Sodium: 143 mmol/L (ref 135–145)
Total Bilirubin: 1.1 mg/dL (ref 0.3–1.2)
Total Protein: 6.5 g/dL (ref 6.5–8.1)

## 2018-06-20 LAB — I-STAT TROPONIN, ED: Troponin i, poc: 0.02 ng/mL (ref 0.00–0.08)

## 2018-06-20 MED ORDER — DOXYCYCLINE HYCLATE 100 MG PO CAPS: 100.0000 mg | ORAL_CAPSULE | Freq: Two times a day (BID) | ORAL | 0 refills | Status: DC

## 2018-06-20 MED ORDER — IPRATROPIUM-ALBUTEROL 0.5-2.5 (3) MG/3ML IN SOLN: 3.0000 mL | RESPIRATORY_TRACT | 0 refills | Status: DC | PRN

## 2018-06-20 MED ORDER — IPRATROPIUM-ALBUTEROL 0.5-2.5 (3) MG/3ML IN SOLN
3.0000 mL | Freq: Once | RESPIRATORY_TRACT | Status: AC
  Administered 2018-06-20: 3 mL via RESPIRATORY_TRACT
  Filled 2018-06-20: qty 3

## 2018-06-20 NOTE — ED Provider Notes (Signed)
[place] EMERGENCY DEPARTMENT Provider Note   CSN: [id number] Arrival date & time: 06/20/18  1131     History   Chief Complaint Chief Complaint  Patient presents with  . Shortness of Breath    HPI Kyle [name] is a 84 y.o. male with h/o bladder cancer on active surveillance, s/p right nephrostomy tube, DM, HTN, HLD, depression, emphysema, OSA on CPAP, NSTEMI, AAA s/p repair, renal insufficiency is here for evaluation of shortness of breath onset yesterday at around 5 pm. He was laying in bed when symptoms began. Son at bedside states his Spo2 was 79%, pt used his rescue inhaler and his oxygen normalized to close to 100%. Associated with epigastric/ow sternal chest discomfort that he described as "burning", onset while in bed.  He thought it was acid reflux.  He almost used his emergency alarm but the pain resolved on its own.  Since he has had associated intermittent leg weakness, feeling like they will give out, cough with productive clear sputum with streaks of blood.    No fevers, chills, palpitations, nausea, vomiting, diarrhea, abdominal pain, increased body swelling, calf pain. No known sick contacts. Pt has been compliant with CPAP.    HPI  Past Medical History:  Diagnosis Date  . AAA (abdominal aortic aneurysm) ([place]) 2004   s/p repair 2004; 4.3 cm infrarenal in 05/2011  . Abnormality of gait 02/23/2013  . Adenocarcinoma of right lung ([place]) 02/24/2011   Ct A/P 2012:  2cm lung mass RLL PET 4481:  Hypermetabolic RLL mass, no other hypermetabolic areas. TTNA 02/2011:  Adenocarcinoma, markers c/w lung origin Right lower lobe superior segmentectomy. 04/01/2011 Dr. Arlyce Dice   . Adenocarcinoma, lung ([place]) 01/2011   transthoracic FNA; resection of the superior segment of the RLL in 03/2011; negative nodes; no chemotherapy nor radiation planned  . Arm fracture    right arm  . Arteriosclerotic cardiovascular disease (ASCVD) 1973, 12/2010   S/P NSTEMI secondary to distal  RCA/PL lesion, tx medically.  EF of  55%-60% per  echo.  . Benign prostatic hypertrophy    s/p transurethral resection of the prostate  . Bilateral renal masses    Cystic, more prominent on CT in 12/2010 than 2007; followed by Dr. Rosana Hoes  . Bladder cancer [facility]) 1996   Transurethral resection of the bladder + chemotherapy/BCG as premed  . Cataract   . Chronic kidney disease    Creatinine 1.4 on discharge 12/20/2100; proteinuria; normal renal ultrasound in 2010; recent creatinines of 1.7-2.; Bilateral cystic renal masses by CT in 2011  . COPD (chronic obstructive pulmonary disease) ([place])   . Coronary artery disease   . Cough    thick phlegm  . Diabetes mellitus    Type II  . Diplopia 02/23/2013  . Diverticulosis   . Essential and other specified forms of tremor 02/23/2013  . Hx of Clostridium difficile infection   . Hyperlipidemia   . Hypertension   . Insomnia   . ITP (idiopathic thrombocytopenic purpura) 09/07/2012   Chronic ITP of adults versus medication-induced ITP.  Stable  . Myocardial infarction ([place])   . Nephrolithiasis 2012   ARF in 01/2011 due to obstructing nephrolithiasis  . Obesity   . OSA (obstructive sleep apnea)   . Polyneuropathy in diabetes(357.2) 02/23/2013  . Thrombocytopenia ([place])   . Tobacco abuse    50-pack-year consumption; quit in 12/2010  . Tubular adenoma of colon   . Ventral hernia     Patient Active Problem List   Diagnosis  Date Noted  . Stage 4 chronic kidney disease ([place]) 06/30/2016  . Subdural hematoma, post-traumatic ([place]) 03/21/2016  . Subdural hematoma ([place]) 03/21/2016  . Malignant neoplasm of trigone of urinary bladder ([place]) 02/12/2016  . Bilirubinemia 12/12/2015  . Thrombocytopenia ([place]) 11/13/2015  . Cough 10/03/2015  . Allergic rhinitis 10/03/2015  . Secondary hyperparathyroidism ([place]) 09/06/2015  . Tremor, essential 08/27/2015  . History of colonic polyps   . Diverticulosis of colon without hemorrhage   . Malignant neoplasm of lateral wall  of urinary bladder ([place]) 01/12/2014  . CIS (carcinoma in situ of bladder) 08/22/2013  . Static tremor 02/23/2013  . Diplopia 02/23/2013  . Diabetic polyneuropathy ([place]) 02/23/2013  . Abnormality of gait 02/23/2013  . COPD exacerbation ([place]) 01/31/2013  . Fracture of right distal radius 12/14/2012  . Diabetes mellitus ([place]) 11/30/2012  . Idiopathic thrombocytopenic purpura ([place]) 09/07/2012  . Depression 07/28/2012  . Benign prostate hyperplasia 06/10/2012  . LLQ pain 11/13/2011  . Hernia of anterior abdominal wall 10/28/2011  . AAA (abdominal aortic aneurysm) ([place])   . Tobacco abuse   . Diarrhea 10/22/2011  . Tubular adenoma of colon   . Bladder neck obstruction 06/05/2011  . Obstructive sleep apnea on CPAP 03/27/2011  . Fasting hyperglycemia 03/27/2011  . Recurrent nephrolithiasis 03/27/2011  . Carcinoma of bladder ([place]) 03/27/2011  . COPD (chronic obstructive pulmonary disease) ([place]) 02/24/2011  . Adenocarcinoma of right lung ([place]) 02/24/2011  . CAD (coronary artery disease) 01/06/2011  . Hypertension 01/06/2011  . Chronic kidney disease, stage III (moderate) ([place]) 01/06/2011    Past Surgical History:  Procedure Laterality Date  . ABDOMINAL AORTIC ANEURYSM REPAIR  2004  . CARDIAC CATHETERIZATION    . COLONOSCOPY  03/19/2010   Dr. Gala Romney -(poor prep) Anal papilla, rectal hyperplastic polyp, tubular adenoma removed splenic flexure, left-sided diverticula  . COLONOSCOPY  11/28/2004   RMR:  Diminutive rectal and left colon polyps as described above, cold  biopsied/removed/  Left sided diverticula. The remainder of the colonic mucosa appeared normal.  . COLONOSCOPY   09/15/01   RMR: Multiple diminutive polyps destroyed with dermolysis as described above/ Multiple small polyps on stalks in the colon resected with snare cautery/ Scattered pan colonic diverticulum/ The remainder of the colonic mucosa appeared normal  . COLONOSCOPY N/A 05/01/2015   Procedure: COLONOSCOPY;  Surgeon:  Daneil Dolin, MD;  Location: [facility];  Service: Endoscopy;  Laterality: N/A;  1115  . CYSTECTOMY    . CYSTOSCOPY  04/2014  . CYSTOSTOMY W/ BLADDER BIOPSY    . FLEXIBLE SIGMOIDOSCOPY  2014   Dr. Olevia Perches: tubular adenoma, negative stool studies   . LUNG LOBECTOMY    . TONSILLECTOMY    . TRANSURETHRAL RESECTION OF PROSTATE    . VIDEO BRONCHOSCOPY WITH ENDOBRONCHIAL NAVIGATION N/A 10/10/2013   Procedure: VIDEO BRONCHOSCOPY WITH ENDOBRONCHIAL NAVIGATION;  Surgeon: Melrose Nakayama, MD;  Location: [place];  Service: Thoracic;  Laterality: N/A;  NO BLOOD THINNERS BUT PATIENT HAS ITP  . WEDGE RESECTION  04/2011   carcinoma of lung        Home Medications    Prior to Admission medications   Medication Sig Start Date End Date Taking? Authorizing Provider  acetaminophen (TYLENOL) 500 MG tablet Take 500-1,000 mg by mouth at bedtime.    [provider]  ALPRAZolam Duanne Moron) 0.5 MG tablet TAKE ONE TABLET TWICE DAILY AS NEEDED FOR ANXIETY. 01/19/18   Mikey Kirschner, MD  aspirin EC 81 MG tablet Take 81 mg by mouth  daily.    [provider]  cephALEXin (KEFLEX) 250 MG capsule Take 1 capsule (250 mg total) by mouth 3 (three) times daily. 12/27/17   Rancour, Annie Main, MD  cetirizine (ZYRTEC) 10 MG tablet Take 1 tablet (10 mg total) by mouth daily. 10/20/16   Mikey Kirschner, MD  cholecalciferol (VITAMIN D) 1000 units tablet Take 2,000 Units by mouth daily.    [provider]  citalopram (CELEXA) 20 MG tablet TAKE ONE (1) TABLET BY MOUTH EVERY DAY 11/09/17   Mikey Kirschner, MD  doxycycline (VIBRAMYCIN) 100 MG capsule Take 1 capsule (100 mg total) by mouth 2 (two) times daily. 06/20/18   Kinnie Feil, PA-C  fluticasone (FLONASE) 50 MCG/ACT nasal spray Place 1 spray into both nostrils daily. 11/09/17   Mikey Kirschner, MD  ipratropium-albuterol (DUONEB) 0.5-2.5 (3) MG/3ML SOLN Take 3 mLs by nebulization every 4 (four) hours as needed. 06/20/18   Kinnie Feil, PA-C   metoprolol tartrate (LOPRESSOR) 50 MG tablet TAKE ONE-HALF TABLET BY MOUTH TWICE DAILY 11/09/17   Mikey Kirschner, MD  mometasone (ELOCON) 0.1 % cream Apply 1 application topically daily. Affected area 02/03/18   Mikey Kirschner, MD  niacin (NIASPAN) 500 MG CR tablet Take 2 tablets (1,000 mg total) by mouth at bedtime. 10/20/16   Mikey Kirschner, MD  nitroGLYCERIN (NITROSTAT) 0.4 MG SL tablet Place 1 tablet (0.4 mg total) under the tongue every 5 (five) minutes as needed. Call MD if need more than 2 Patient taking differently: Place 0.4 mg under the tongue every 5 (five) minutes as needed for chest pain. Call MD if need more than 2 04/18/16   Mikey Kirschner, MD  nitroGLYCERIN (NITROSTAT) 0.4 MG SL tablet PLACE ONE TABLET UNDER THE TONGUE EVERY 5 MINUTES AS NEEDED. CALL MD IF NEED MORE THAN 2. 10/08/17   Mikey Kirschner, MD  OLANZapine (ZYPREXA) 2.5 MG tablet Take 2.5 mg by mouth daily.    [provider]  Pembrolizumab (KEYTRUDA IV) Inject into the vein every 28 (twenty-eight) days. At [facility]     [provider]  pravastatin (PRAVACHOL) 80 MG tablet TAKE ONE (1) TABLET EACH DAY 11/09/17   Mikey Kirschner, MD  PROAIR HFA 108 [phone] Base) MCG/ACT inhaler INHALE TWO PUFFS EVERY 6 HOURS AS NEEDED 04/23/18   Mikey Kirschner, MD  Probiotic Product (PROBIOTIC PEARLS ADVANTAGE) CAPS Take 1 capsule by mouth daily.    [provider]  SPIRIVA HANDIHALER 18 MCG inhalation capsule PLACE ONE CAPSULE INTO INHALER AND INHALE DAILY 10/20/17   Juanito Doom, MD  tiotropium (SPIRIVA) 18 MCG inhalation capsule Place 18 mcg into inhaler and inhale daily.    [provider]  vitamin B-12 1000 MCG tablet Take 1 tablet (1,000 mcg total) by mouth daily. 03/24/16   Rai, Vernelle Emerald, MD  budesonide-formoterol (SYMBICORT) 160-4.5 MCG/ACT inhaler Inhale 2 puffs into the lungs 2 (two) times daily.    10/06/11  [provider]    Family History Family History  Problem Relation  Age of Onset  . Aortic aneurysm Mother   . Emphysema Father        smoker  . Clotting disorder Father   . Arthritis Father   . Hypertension Father   . Diabetes Father   . Aortic aneurysm Father   . Tremor Father   . Stroke Paternal Grandmother   . Other Paternal Grandfather        brain aneurysm  . Colon cancer  Cousin     Social History Social History   Tobacco Use  . Smoking status: Former Smoker    Packs/day: 1.00    Years: 50.00    Pack years: 50.00    Types: Cigarettes    Start date: 07/22/1955  . Smokeless tobacco: Never Used  . Tobacco comment: "smokes a few days a week" 09/29/17  Substance Use Topics  . Alcohol use: No    Alcohol/week: 0.0 standard drinks  . Drug use: No     Allergies   Codeine; Etodolac; and Zanaflex [tizanidine hcl]   Review of Systems Review of Systems  Respiratory: Positive for shortness of breath.   Cardiovascular: Positive for chest pain (resolved).  All other systems reviewed and are negative.    Physical Exam Updated Vital Signs BP (!) 161/87   Pulse 65   Temp 98 F (36.7 C)   Resp (!) 21   SpO2 100%   Physical Exam  Constitutional: He is oriented to person, place, and time. He appears well-developed and well-nourished. No distress.  NAD.  HENT:  Head: Normocephalic and atraumatic.  Right Ear: External ear normal.  Left Ear: External ear normal.  Nose: Nose normal.  Eyes: Conjunctivae and EOM are normal. No scleral icterus.  Neck: Normal range of motion. Neck supple.  Cardiovascular: Normal rate, regular rhythm, normal heart sounds and intact distal pulses.  1+ radial and DP pulses bilaterally.  No lower extremity edema.  No calf tenderness.  Pulmonary/Chest: Effort normal. He has wheezes.  No obvious increase in work of breathing.  He is speaking in full sentences.  No hypoxia on room air noted.  Faint expiratory wheezing to the right lobe posteriorly lung.  No crackles.  Musculoskeletal: Normal range of motion. He  exhibits no deformity.  Neurological: He is alert and oriented to person, place, and time.  Skin: Skin is warm and dry. Capillary refill takes less than 2 seconds.  Psychiatric: He has a normal mood and affect. His behavior is normal. Judgment and thought content normal.  Nursing note and vitals reviewed.    ED Treatments / Results  Labs (all labs ordered are listed, but only abnormal results are displayed) Labs Reviewed  CBC WITH DIFFERENTIAL/PLATELET - Abnormal; Notable for the following components:      Result Value   RBC 3.36 (*)    Hemoglobin 9.6 (*)    HCT 33.4 (*)    MCHC 28.7 (*)    RDW 18.5 (*)    Platelets 84 (*)    All other components within normal limits  COMPREHENSIVE METABOLIC PANEL - Abnormal; Notable for the following components:   Chloride 113 (*)    CO2 21 (*)    Glucose, Bld 130 (*)    BUN 43 (*)    Creatinine, Ser 3.46 (*)    Albumin 2.9 (*)    AST 9 (*)    GFR calc non Af Amer 16 (*)    GFR calc Af Amer 18 (*)    All other components within normal limits  I-STAT ARTERIAL BLOOD GAS, ED - Abnormal; Notable for the following components:   pH, Arterial 7.339 (*)    pO2, Arterial 61.0 (*)    Bicarbonate 19.3 (*)    TCO2 20 (*)    Acid-base deficit 6.0 (*)    All other components within normal limits  I-STAT TROPONIN, ED    EKG EKG Interpretation  Date/Time:  Sunday June 20 2018 11:43:54 EST Ventricular Rate:  68 PR Interval:  QRS Duration: 126 QT Interval:  420 QTC Calculation: 447 R Axis:   -56 Text Interpretation:  Sinus rhythm Prolonged PR interval Left bundle branch block Baseline wander in lead(s) V3 No significant change since last tracing Confirmed by Pattricia Boss [phone]) on 06/20/2018 3:08:54 PM   Radiology Dg Chest 2 View  Result Date: 06/20/2018 CLINICAL DATA:  Recent onset productive cough and shortness of breath. EXAM: CHEST - 2 VIEW COMPARISON:  PA and lateral chest 06/10/2017 and 06/24/2016. FINDINGS: Mild scar in the  right lung base is unchanged. Lungs otherwise clear. No pleural effusion. No pneumothorax. Heart size is normal. Aortic atherosclerosis is noted. No acute or focal bony abnormality. IMPRESSION: No acute disease. Electronically Signed   By: Inge Rise M.D.   On: 06/20/2018 13:29    Procedures Procedures (including critical care time)  Medications Ordered in ED Medications  ipratropium-albuterol (DUONEB) 0.5-2.5 (3) MG/3ML nebulizer solution 3 mL (3 mLs Nebulization Given 06/20/18 1300)     Initial Impression / Assessment and Plan / ED Course  I have reviewed the triage vital signs and the nursing notes.  Pertinent labs & imaging results that were available during my care of the patient were reviewed by me and considered in my medical decision making (see chart for details).  Clinical Course as of Jun 20 1957  Sun Jun 20, 2018  1340 Creatinine(!): 3.46 [CG]  1340 GFR, Est Non African American(!): 16 [CG]  1340 Hemoglobin(!): 9.6 [CG]  1341 pCO2 arterial: 35.8 [CG]  1341 Bicarbonate(!): 19.3 [CG]  1341 pO2, Arterial(!): 61.0 [CG]    Clinical Course User Index [CG] Kinnie Feil, PA-C    Given symptomatology likely acute COPD exacerbation.  His brief episode of epigastric/low chest pain could be from bronchospasm, chest congestion.  This CP was non exertional, non pleuritic.  He has known cardiac and lung risk factors. We will obtain screening labs and reassess.  I doubt  HF exacebation. He does not look fluid overloaded on exam.   Lab work reviewed. Pt's baseline creatinine is 3.1-3.3.  Son showed me most recent monthly labs and his creatinine then was ~3.26, not significantly different than today's creatinine. Hgb at baseline as well.  CXR, trop, BNP WNL.    On exam, pt is non toxic appearing with normal breathing effort. No fever, no tachypnea, no tachycardia, normal oxygen saturations. Only faint expiratory wheezing. Wheezing improved after ED tx with breathing tx. Pt  ambulated in ED with normal O2 sats and improved symptoms. CXR normal.  Likely viral URI leading to COPD flare. No SIRS criteria met today.  Will tx symptoms conservatively.  Will dc with duoneb.. ED return precautions given. Patient is aware that a viral URI infection may precede the onset of bacterial bronchitis or pneumonia. Patient is aware of s/s that would warrant return to ED for further reevaluation. He is to f/u with PCP in 1 week.   Final Clinical Impressions(s) / ED Diagnoses   Final diagnoses:  Bronchitis  COPD exacerbation [facility])    ED Discharge Orders         Ordered    doxycycline (VIBRAMYCIN) 100 MG capsule  2 times daily     06/20/18 1516    DME Nebulizer machine     06/20/18 1516    ipratropium-albuterol (DUONEB) 0.5-2.5 (3) MG/3ML SOLN  Every 4 hours PRN     06/20/18 1516           Kinnie Feil, [place] 06/20/18 1958  Pattricia Boss, MD 06/22/18 [phone]

## 2018-06-20 NOTE — ED Notes (Signed)
Sats remain 99 -100 % while walking

## 2018-06-20 NOTE — Discharge Instructions (Addendum)
You were seen in the ER for shortness of breath.  Work-up today was reassuring.  Your creatinine is at your baseline, slowly increasing like previous months.  Chest x-ray did not show pneumonia.  I suspect your symptoms are from an acute exacerbation of bronchitis/COPD.  We will treat this with doxycycline.  Additionally, I have given you an order for a nebulizing machine.  You can go to a medical supply store and pick up the nebulizing machine.  I have given you a prescription for a mixture of nebulizing medication.  Use the nebulizing machine/medication every 6 hours.  Your condition cannot worsen.  I recommend he follow-up with your primary care doctor in the next 48 hours to ensure that your oxygen levels and symptoms are improving.  Monitor your oxygen levels.  Return to the ER if there is any drop from baseline or less than 90% on room air.  Return for fevers, chills, chest pain or shortness of breath with exertion, increased leg swelling.

## 2018-06-20 NOTE — ED Triage Notes (Signed)
Pt here from home with c/o sob times a few days , pt using cpap at night no O2 , pt also c/o chest pain burning in nature

## 2018-06-22 ENCOUNTER — Encounter: Payer: Self-pay | Admitting: Family Medicine

## 2018-06-22 ENCOUNTER — Ambulatory Visit (INDEPENDENT_AMBULATORY_CARE_PROVIDER_SITE_OTHER): Payer: Medicare Other | Admitting: Family Medicine

## 2018-06-22 VITALS — BP 128/70 | Ht 72.0 in | Wt 242.6 lb

## 2018-06-22 DIAGNOSIS — J441 Chronic obstructive pulmonary disease with (acute) exacerbation: Secondary | ICD-10-CM

## 2018-06-22 DIAGNOSIS — I10 Essential (primary) hypertension: Secondary | ICD-10-CM | POA: Diagnosis not present

## 2018-06-22 DIAGNOSIS — D631 Anemia in chronic kidney disease: Secondary | ICD-10-CM | POA: Diagnosis not present

## 2018-06-22 DIAGNOSIS — N200 Calculus of kidney: Secondary | ICD-10-CM | POA: Diagnosis not present

## 2018-06-22 DIAGNOSIS — N184 Chronic kidney disease, stage 4 (severe): Secondary | ICD-10-CM | POA: Diagnosis not present

## 2018-06-22 NOTE — Progress Notes (Signed)
   Subjective:    Patient ID: Kyle [name], male    DOB: 04-Jun-1934, 84 y.o.   MRN: [id number]  HPI Pt here today for follow up. Pt went to ED on 06/20/18 for SOB. Pt states he is feeling OK today. Pt was given doxy 100 mg; neb treatment. Pt is compliant with medication.   O2 level 79% on 12/1 at home and went to ED for treatment. Was given nebulizer there. D/C home on doxycycline and duoneb, breathing has been better since being home. Has taken the nebulizer treatments he was sent home with in the ED 3 x since being home, thought it was a scheduled medication and not just prn.   Son also stated when he went to take pt to ER on 12/1 found pt with bruising around left eye, denies injury or fall.  Still having congestion and spitting up phlegm. Regularly using 2.5 L of O2 at night with CPAP machine. Reports O2 sats typically around 90% or low 90s at home on RA.   Review of Systems  Constitutional: Negative for chills and fever.  HENT: Positive for congestion.   Respiratory: Positive for cough. Negative for shortness of breath and wheezing.   Cardiovascular: Negative for chest pain.       Objective:   Physical Exam  Constitutional: He appears well-developed and well-nourished. No distress.  HENT:  Head: Normocephalic.  Nose: Nose normal.  Mouth/Throat: Oropharynx is clear and moist.  Eyes: Pupils are equal, round, and reactive to light. Right eye exhibits no discharge. Left eye exhibits no discharge.  Neck: Neck supple.  Cardiovascular: Normal rate, regular rhythm and normal heart sounds.  Pulmonary/Chest: Effort normal and breath sounds normal. No respiratory distress. He has no wheezes. He has no rales.  Lymphadenopathy:    He has no cervical adenopathy.  Neurological: He is alert.  Skin: Skin is warm and dry.  Nursing note and vitals reviewed.  Today's Vitals   06/22/18 0947  BP: 128/70  SpO2: 90%  Weight: 242 lb 9.6 oz (110 kg)  Height: 6' (1.829 m)   Body mass index is  32.9 kg/m.       Assessment & Plan:  COPD exacerbation [facility])  Patient here for follow-up after recent ED visit for COPD exacerbation and bronchitis.  Recommend he continue with doxycycline until finished.  May use DuoNeb as prescribed by ED as needed.  Discussed if oxygen saturation drops below 86% he should use his oxygen and call the office.  If severe difficulty breathing should seek emergency care.  Recommend he follow-up late next week with Dr. Richardson Landry to ensure resolution of symptoms.  Dr. Sallee Lange was consulted on this case and is in agreement with the above treatment plan.

## 2018-06-29 DIAGNOSIS — G4733 Obstructive sleep apnea (adult) (pediatric): Secondary | ICD-10-CM | POA: Diagnosis not present

## 2018-06-29 DIAGNOSIS — N184 Chronic kidney disease, stage 4 (severe): Secondary | ICD-10-CM | POA: Diagnosis not present

## 2018-06-30 DIAGNOSIS — I724 Aneurysm of artery of lower extremity: Secondary | ICD-10-CM | POA: Diagnosis not present

## 2018-06-30 DIAGNOSIS — I714 Abdominal aortic aneurysm, without rupture: Secondary | ICD-10-CM | POA: Diagnosis not present

## 2018-07-01 ENCOUNTER — Telehealth: Payer: Self-pay | Admitting: Family Medicine

## 2018-07-01 ENCOUNTER — Ambulatory Visit: Payer: Medicare Other | Admitting: Family Medicine

## 2018-07-01 NOTE — Telephone Encounter (Signed)
Pt went and had two cavities filled today which caused some bleeding. They asked if he was on any blood thinners he takes the 81 mg Asprin. They are needing a letter from Dr. Richardson Landry saying it is ok to pull teeth if they are unable to repair the crowns  Please call son Jeancarlo Leffler when completed CB# [phone].  He is going to [place] seeing Dr. Rona Ravens.   [employer] phone # [phone] [employer] fax # [phone]

## 2018-07-01 NOTE — Telephone Encounter (Signed)
With pts hx of aneurysm this advice will need to come from his vascular specialist, I would recommend wiating until he has a consensus with his neurologist about the best current trecommendation for blood thinning, after they decide on that they can advise on whether to stop or not during a dental procedure, for 95% of our pt's we can answer this [id number], for very complicated pts like mr Wollin this question has to be addressed by his specialist providing the med

## 2018-07-01 NOTE — Telephone Encounter (Signed)
I spoke with the pt son he says the pt had cavities filled today that bled a bit. Patient is on an 81 mg Asprin, and says if in the future/first of year when he goes in to have his crowns placed and are unable to do that (have crowns placed) and they need to pull the teeth instead,will it be a problem with stopping the Asprin or should they just pull them with out stopping the Asprin. I spoke to the pt son and he says he father has several Aneurysm behind his knee cap and they are awaiting news from vascular Dr about whether or not to start pt on another form of a blood thinner. He says the vascular Dr was going to speak to her Neurologist friend and call them back in regards to whether or not to start another blood thinner.As for the Asprin what should they do about the teeth bleeding during procedure?  They want a letter from you advising what plan of action.

## 2018-07-01 NOTE — Telephone Encounter (Signed)
I tried calling to get more information.No answer.

## 2018-07-01 NOTE — Telephone Encounter (Signed)
Contacted son and notified son that this information will need to come from vascular surgeon. Pt son verbalized understanding.

## 2018-07-02 ENCOUNTER — Other Ambulatory Visit (HOSPITAL_COMMUNITY): Payer: Medicare Other

## 2018-07-05 DIAGNOSIS — N200 Calculus of kidney: Secondary | ICD-10-CM | POA: Diagnosis not present

## 2018-07-05 DIAGNOSIS — N184 Chronic kidney disease, stage 4 (severe): Secondary | ICD-10-CM | POA: Diagnosis not present

## 2018-07-05 DIAGNOSIS — Z8551 Personal history of malignant neoplasm of bladder: Secondary | ICD-10-CM | POA: Diagnosis not present

## 2018-07-05 DIAGNOSIS — N281 Cyst of kidney, acquired: Secondary | ICD-10-CM | POA: Diagnosis not present

## 2018-07-05 DIAGNOSIS — Z8554 Personal history of malignant neoplasm of ureter: Secondary | ICD-10-CM | POA: Diagnosis not present

## 2018-07-06 DIAGNOSIS — J449 Chronic obstructive pulmonary disease, unspecified: Secondary | ICD-10-CM | POA: Diagnosis not present

## 2018-07-07 ENCOUNTER — Ambulatory Visit (HOSPITAL_COMMUNITY)
Admission: RE | Admit: 2018-07-07 | Discharge: 2018-07-07 | Disposition: A | Discharge status: Home / Self Care | Payer: Medicare Other | Source: Ambulatory Visit | Attending: Oncology | Admitting: Oncology

## 2018-07-07 ENCOUNTER — Encounter (HOSPITAL_COMMUNITY): Payer: Self-pay

## 2018-07-07 DIAGNOSIS — C679 Malignant neoplasm of bladder, unspecified: Secondary | ICD-10-CM

## 2018-07-07 MED ORDER — LIDOCAINE HCL 1 % IJ SOLN
INTRAMUSCULAR | Status: AC
  Filled 2018-07-07: qty 20

## 2018-07-07 MED ORDER — IOPAMIDOL (ISOVUE-300) INJECTION 61%
INTRAVENOUS | Status: AC
  Filled 2018-07-07: qty 50

## 2018-07-09 ENCOUNTER — Encounter: Payer: Self-pay | Admitting: Family Medicine

## 2018-07-09 ENCOUNTER — Ambulatory Visit (INDEPENDENT_AMBULATORY_CARE_PROVIDER_SITE_OTHER): Payer: Medicare Other | Admitting: Family Medicine

## 2018-07-09 VITALS — BP 130/72 | Ht 72.0 in | Wt 247.0 lb

## 2018-07-09 DIAGNOSIS — Z79899 Other long term (current) drug therapy: Secondary | ICD-10-CM | POA: Diagnosis not present

## 2018-07-09 DIAGNOSIS — F29 Unspecified psychosis not due to a substance or known physiological condition: Secondary | ICD-10-CM

## 2018-07-09 DIAGNOSIS — E119 Type 2 diabetes mellitus without complications: Secondary | ICD-10-CM

## 2018-07-09 DIAGNOSIS — J431 Panlobular emphysema: Secondary | ICD-10-CM | POA: Diagnosis not present

## 2018-07-09 DIAGNOSIS — I10 Essential (primary) hypertension: Secondary | ICD-10-CM | POA: Diagnosis not present

## 2018-07-09 DIAGNOSIS — F060 Psychotic disorder with hallucinations due to known physiological condition: Secondary | ICD-10-CM

## 2018-07-09 NOTE — Progress Notes (Signed)
   Subjective:    Patient ID: Kyle [name], male    DOB: 1934-07-21, 84 y.o.   MRN: [id number]  HPIpt arrives with son Francis Doenges to discuss surgery - Minimally invasive versus surgical treatment of an aneyrysm of the popliteal artery.    Patient arrives for discussion regarding anticoagulation.  See prior phone message.  I had advised patient and family to confer with specialist due to complexity of his presentation.  Patient states he also would like to hear my perspective.  Vascular surgeons note reviewed today in presence of patient.  Review of vascular studies also pending.  Patient has popliteal artery with dilatation and potential thrombus.     Review of Systems No headache, no major weight loss or weight gain, no chest pain no back pain abdominal pain no change in bowel habits complete ROS otherwise negative     Objective:   Physical Exam Alert and oriented, vitals reviewed and stable, NAD ENT-TM's and ext canals WNL bilat via otoscopic exam Soft palate, tonsils and post pharynx WNL via oropharyngeal exam Neck-symmetric, no masses; thyroid nonpalpable and nontender Pulmonary-no tachypnea or accessory muscle use; Clear without wheezes via auscultation Card--no abnrml murmurs, rhythm reg and rate WNL Carotid pulses symmetric, without bruits        Assessment & Plan:  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  History of TIA/cerebral event greater than 1 year ago.  Vascular surgeon consulted their neurosurgeon colleague.  They felt since it has been so long since this event patient could not handle moving up to the Plavix.  Discussion held with patient.  I agree with advancing to Plavix.  Patient once again also brought up his skin condition.  He is having hallucinatory assessment of his skin.  Currently managed by dermatologist.  Patient asked if this could be related to possible findings with arterial leg  studies and I stated not  Multiple questions answered  Greater than 50% of this 25 minute face to face visit was spent in counseling and discussion and coordination of care regarding the above diagnosis/diagnosies

## 2018-07-15 ENCOUNTER — Ambulatory Visit (HOSPITAL_COMMUNITY)
Admission: RE | Admit: 2018-07-15 | Discharge: 2018-07-15 | Disposition: A | Discharge status: Home / Self Care | Payer: Medicare Other | Source: Ambulatory Visit | Attending: Oncology | Admitting: Oncology

## 2018-07-15 ENCOUNTER — Encounter (HOSPITAL_COMMUNITY): Payer: Self-pay | Admitting: Interventional Radiology

## 2018-07-15 ENCOUNTER — Other Ambulatory Visit (HOSPITAL_COMMUNITY): Payer: Medicare Other

## 2018-07-15 ENCOUNTER — Other Ambulatory Visit: Payer: Self-pay | Admitting: Oncology

## 2018-07-15 DIAGNOSIS — Z8551 Personal history of malignant neoplasm of bladder: Secondary | ICD-10-CM | POA: Diagnosis not present

## 2018-07-15 DIAGNOSIS — Z436 Encounter for attention to other artificial openings of urinary tract: Secondary | ICD-10-CM | POA: Diagnosis not present

## 2018-07-15 DIAGNOSIS — C679 Malignant neoplasm of bladder, unspecified: Secondary | ICD-10-CM | POA: Diagnosis not present

## 2018-07-15 HISTORY — PX: IR EXT NEPHROURETERAL CATH EXCHANGE: IMG5418

## 2018-07-15 MED ORDER — LIDOCAINE HCL 1 % IJ SOLN
INTRAMUSCULAR | Status: AC
  Filled 2018-07-15: qty 20

## 2018-07-15 MED ORDER — IOPAMIDOL (ISOVUE-300) INJECTION 61%
INTRAVENOUS | Status: AC
  Administered 2018-07-15: 10 mL
  Filled 2018-07-15: qty 50

## 2018-07-15 MED ORDER — IOPAMIDOL (ISOVUE-300) INJECTION 61%
50.0000 mL | Freq: Once | INTRAVENOUS | Status: AC | PRN
  Administered 2018-07-15: 10 mL

## 2018-07-15 MED ORDER — LIDOCAINE HCL 1 % IJ SOLN
INTRAMUSCULAR | Status: DC | PRN
  Administered 2018-07-15: 10 mL via INTRADERMAL

## 2018-07-15 NOTE — Procedures (Signed)
Pre Procedure Dx: Bladder cancer Post Procedure Dx: Same  Successful right sided nephroureteral catheter exchange and upsizing to 10 Fr.  EBL: None   No immediate complications.   Ronny Bacon, MD Pager #: [phone]

## 2018-07-16 ENCOUNTER — Other Ambulatory Visit: Payer: Self-pay | Admitting: Oncology

## 2018-07-16 ENCOUNTER — Encounter (HOSPITAL_COMMUNITY): Payer: Self-pay

## 2018-07-16 DIAGNOSIS — C679 Malignant neoplasm of bladder, unspecified: Secondary | ICD-10-CM

## 2018-07-20 DIAGNOSIS — L219 Seborrheic dermatitis, unspecified: Secondary | ICD-10-CM | POA: Diagnosis not present

## 2018-07-22 ENCOUNTER — Other Ambulatory Visit: Payer: Self-pay

## 2018-07-22 ENCOUNTER — Other Ambulatory Visit: Payer: Self-pay | Admitting: Pulmonary Disease

## 2018-07-22 NOTE — Telephone Encounter (Signed)
Kyle [name]

## 2018-07-22 NOTE — Telephone Encounter (Signed)
In all due respect this is Dr. [name]

## 2018-07-23 ENCOUNTER — Other Ambulatory Visit: Payer: Self-pay

## 2018-07-23 DIAGNOSIS — N200 Calculus of kidney: Secondary | ICD-10-CM | POA: Diagnosis not present

## 2018-07-23 DIAGNOSIS — I10 Essential (primary) hypertension: Secondary | ICD-10-CM | POA: Diagnosis not present

## 2018-07-23 DIAGNOSIS — D631 Anemia in chronic kidney disease: Secondary | ICD-10-CM | POA: Diagnosis not present

## 2018-07-23 DIAGNOSIS — N184 Chronic kidney disease, stage 4 (severe): Secondary | ICD-10-CM | POA: Diagnosis not present

## 2018-07-23 MED ORDER — ALPRAZOLAM 0.5 MG PO TABS: ORAL_TABLET | ORAL | 5 refills | Status: DC

## 2018-07-23 MED ORDER — TIOTROPIUM BROMIDE MONOHYDRATE 18 MCG IN CAPS: ORAL_CAPSULE | RESPIRATORY_TRACT | 3 refills | Status: AC

## 2018-07-23 NOTE — Telephone Encounter (Signed)
Six mo worth 

## 2018-07-23 NOTE — Telephone Encounter (Signed)
pls see.

## 2018-07-28 ENCOUNTER — Other Ambulatory Visit: Payer: Self-pay | Admitting: *Deleted

## 2018-07-28 ENCOUNTER — Other Ambulatory Visit: Payer: Self-pay | Admitting: Pulmonary Disease

## 2018-07-28 MED ORDER — ALBUTEROL SULFATE HFA 108 (90 BASE) MCG/ACT IN AERS: INHALATION_SPRAY | RESPIRATORY_TRACT | 2 refills | Status: AC

## 2018-07-28 NOTE — Progress Notes (Signed)
Rx request from  [facility] for Proair refill.  This has been sent.  Nothing further needed.

## 2018-08-02 DIAGNOSIS — Z936 Other artificial openings of urinary tract status: Secondary | ICD-10-CM | POA: Diagnosis not present

## 2018-08-05 DIAGNOSIS — N184 Chronic kidney disease, stage 4 (severe): Secondary | ICD-10-CM | POA: Diagnosis not present

## 2018-08-05 DIAGNOSIS — E870 Hyperosmolality and hypernatremia: Secondary | ICD-10-CM | POA: Diagnosis not present

## 2018-08-06 ENCOUNTER — Other Ambulatory Visit: Payer: Self-pay

## 2018-08-06 ENCOUNTER — Telehealth: Payer: Self-pay | Admitting: Family Medicine

## 2018-08-06 DIAGNOSIS — J449 Chronic obstructive pulmonary disease, unspecified: Secondary | ICD-10-CM | POA: Diagnosis not present

## 2018-08-06 MED ORDER — PRAVASTATIN SODIUM 80 MG PO TABS: ORAL_TABLET | ORAL | 1 refills | Status: DC

## 2018-08-06 NOTE — Telephone Encounter (Signed)
Pharmacy requesting refills on Metoprolol 50 mg and Celexa 20 mg. Please advise. Thank you

## 2018-08-08 NOTE — Telephone Encounter (Signed)
Sure six mo worth

## 2018-08-09 ENCOUNTER — Other Ambulatory Visit: Payer: Self-pay | Admitting: *Deleted

## 2018-08-09 ENCOUNTER — Ambulatory Visit: Payer: Medicare Other | Admitting: Family Medicine

## 2018-08-09 MED ORDER — METOPROLOL TARTRATE 50 MG PO TABS: ORAL_TABLET | ORAL | 1 refills | Status: DC

## 2018-08-09 MED ORDER — CITALOPRAM HYDROBROMIDE 20 MG PO TABS: ORAL_TABLET | ORAL | 1 refills | Status: DC

## 2018-08-09 NOTE — Telephone Encounter (Signed)
Refills sent to pharm

## 2018-08-11 ENCOUNTER — Ambulatory Visit (INDEPENDENT_AMBULATORY_CARE_PROVIDER_SITE_OTHER): Payer: Medicare Other | Admitting: Family Medicine

## 2018-08-11 ENCOUNTER — Encounter: Payer: Self-pay | Admitting: Family Medicine

## 2018-08-11 VITALS — Ht 72.0 in

## 2018-08-11 DIAGNOSIS — E785 Hyperlipidemia, unspecified: Secondary | ICD-10-CM | POA: Diagnosis not present

## 2018-08-11 DIAGNOSIS — I10 Essential (primary) hypertension: Secondary | ICD-10-CM

## 2018-08-11 DIAGNOSIS — F060 Psychotic disorder with hallucinations due to known physiological condition: Secondary | ICD-10-CM

## 2018-08-11 DIAGNOSIS — E119 Type 2 diabetes mellitus without complications: Secondary | ICD-10-CM | POA: Diagnosis not present

## 2018-08-11 DIAGNOSIS — F29 Unspecified psychosis not due to a substance or known physiological condition: Secondary | ICD-10-CM

## 2018-08-11 LAB — POCT GLYCOSYLATED HEMOGLOBIN (HGB A1C): HEMOGLOBIN A1C: 5 % (ref 4.0–5.6)

## 2018-08-11 MED ORDER — PRAVASTATIN SODIUM 80 MG PO TABS: ORAL_TABLET | ORAL | 1 refills | Status: AC

## 2018-08-11 MED ORDER — CITALOPRAM HYDROBROMIDE 20 MG PO TABS: ORAL_TABLET | ORAL | 1 refills | Status: AC

## 2018-08-11 MED ORDER — METOPROLOL TARTRATE 50 MG PO TABS: ORAL_TABLET | ORAL | 1 refills | Status: DC

## 2018-08-11 MED ORDER — AMOXICILLIN 500 MG PO CAPS: 500.0000 mg | ORAL_CAPSULE | Freq: Three times a day (TID) | ORAL | 0 refills | Status: DC

## 2018-08-11 NOTE — Progress Notes (Signed)
   Subjective:    Patient ID: Kyle [name], male    DOB: 1934/05/27, 85 y.o.   MRN: [id number]  HPI Patient is here today to follow up on his chronic health issues.  He has a history of Hypertension he is taking Metoprolol 50 mg 1/2 bid  History of Diabetes he states he is not taking any medications for this  He has a history of depression takes xanax 0.5 one bid prn  Patient notes ongoing compliance with antidepressant medication. No obvious side effects. Reports does not miss a dose. Overall continues to help depression substantially. No thoughts of homicide or suicide. Would like to maintain medication.  Blood pressure medicine and blood pressure levels reviewed today with patient. Compliant with blood pressure medicine. States does not miss a dose. No obvious side effects. Blood pressure generally good when checked elsewhere. Watching salt intake.   Patient continues to take lipid medication regularly. No obvious side effects from it. Generally does not miss a dose. Prior blood work results are reviewed with patient. Patient continues to work on fat intake in diet  Patient also experienced what sounds like a scleral hemorrhage.  Asked me to take a look at his eyes   and Celexa 20 mg once per day.  History of stage 4 CKD he see's Dr.Stem at [facility]. Results for orders placed or performed in visit on 08/11/18  POCT glycosylated hemoglobin (Hb A1C)  Result Value Ref Range   Hemoglobin A1C 5.0 4.0 - 5.6 %   HbA1c POC (<> result, manual entry)     HbA1c, POC (prediabetic range)     HbA1c, POC (controlled diabetic range)       Review of Systems No headache, no major weight loss or weight gain, no chest pain no back pain abdominal pain no change in bowel habits complete ROS otherwise negative     Objective:   Physical Exam   Alert and oriented, vitals reviewed and stable, NAD ENT-TM's and ext canals WNL bilat via otoscopic exam Soft palate, tonsils and post pharynx WNL via  oropharyngeal exam Neck-symmetric, no masses; thyroid nonpalpable and nontender Pulmonary-no tachypnea or accessory muscle use; Clear without wheezes via auscultation Card--no abnrml murmurs, rhythm reg and rate WNL Carotid pulses symmetric, without bruits      Assessment & Plan:  Impression 1 type 2 diabetes good control discussed to maintain same meds  2.  Hyperlipidemia.  Prior blood work reviewed discussed to maintain same meds compliance discussed  3.  Resolving scleral hemorrhage right eye discussed.  4.  Stage IV chronic kidney disease followed by specialist  5.  Depression clinically stable discussed maintain same meds  Follow-up to schedule several months.  Diet exercise discussed.  Medications refilled.

## 2018-08-19 DIAGNOSIS — N200 Calculus of kidney: Secondary | ICD-10-CM | POA: Diagnosis not present

## 2018-08-19 DIAGNOSIS — D631 Anemia in chronic kidney disease: Secondary | ICD-10-CM | POA: Diagnosis not present

## 2018-08-19 DIAGNOSIS — I10 Essential (primary) hypertension: Secondary | ICD-10-CM | POA: Diagnosis not present

## 2018-08-19 DIAGNOSIS — N184 Chronic kidney disease, stage 4 (severe): Secondary | ICD-10-CM | POA: Diagnosis not present

## 2018-08-27 ENCOUNTER — Encounter (HOSPITAL_COMMUNITY): Payer: Self-pay

## 2018-08-27 ENCOUNTER — Inpatient Hospital Stay (HOSPITAL_COMMUNITY)
Admission: EM | Admit: 2018-08-27 | Discharge: 2018-08-31 | DRG: 190 | Disposition: A | Discharge status: Skilled Nursing Facility | Payer: Medicare Other | Attending: Internal Medicine | Admitting: Internal Medicine

## 2018-08-27 ENCOUNTER — Encounter: Payer: Self-pay | Admitting: Family Medicine

## 2018-08-27 ENCOUNTER — Emergency Department (HOSPITAL_COMMUNITY): Payer: Medicare Other

## 2018-08-27 ENCOUNTER — Ambulatory Visit (INDEPENDENT_AMBULATORY_CARE_PROVIDER_SITE_OTHER): Payer: Medicare Other | Admitting: Family Medicine

## 2018-08-27 ENCOUNTER — Other Ambulatory Visit: Payer: Self-pay

## 2018-08-27 VITALS — HR 88 | Temp 97.3°F | Wt 239.0 lb

## 2018-08-27 DIAGNOSIS — I129 Hypertensive chronic kidney disease with stage 1 through stage 4 chronic kidney disease, or unspecified chronic kidney disease: Secondary | ICD-10-CM | POA: Diagnosis not present

## 2018-08-27 DIAGNOSIS — Z906 Acquired absence of other parts of urinary tract: Secondary | ICD-10-CM | POA: Diagnosis not present

## 2018-08-27 DIAGNOSIS — J471 Bronchiectasis with (acute) exacerbation: Principal | ICD-10-CM | POA: Diagnosis present

## 2018-08-27 DIAGNOSIS — J441 Chronic obstructive pulmonary disease with (acute) exacerbation: Secondary | ICD-10-CM

## 2018-08-27 DIAGNOSIS — R0689 Other abnormalities of breathing: Secondary | ICD-10-CM | POA: Diagnosis not present

## 2018-08-27 DIAGNOSIS — R531 Weakness: Secondary | ICD-10-CM | POA: Diagnosis not present

## 2018-08-27 DIAGNOSIS — J310 Chronic rhinitis: Secondary | ICD-10-CM

## 2018-08-27 DIAGNOSIS — Z833 Family history of diabetes mellitus: Secondary | ICD-10-CM

## 2018-08-27 DIAGNOSIS — R1310 Dysphagia, unspecified: Secondary | ICD-10-CM | POA: Diagnosis not present

## 2018-08-27 DIAGNOSIS — Z7982 Long term (current) use of aspirin: Secondary | ICD-10-CM

## 2018-08-27 DIAGNOSIS — G4733 Obstructive sleep apnea (adult) (pediatric): Secondary | ICD-10-CM | POA: Diagnosis not present

## 2018-08-27 DIAGNOSIS — E1122 Type 2 diabetes mellitus with diabetic chronic kidney disease: Secondary | ICD-10-CM | POA: Diagnosis not present

## 2018-08-27 DIAGNOSIS — J440 Chronic obstructive pulmonary disease with acute lower respiratory infection: Secondary | ICD-10-CM | POA: Diagnosis not present

## 2018-08-27 DIAGNOSIS — Z79899 Other long term (current) drug therapy: Secondary | ICD-10-CM

## 2018-08-27 DIAGNOSIS — I252 Old myocardial infarction: Secondary | ICD-10-CM | POA: Diagnosis not present

## 2018-08-27 DIAGNOSIS — R0902 Hypoxemia: Secondary | ICD-10-CM | POA: Diagnosis not present

## 2018-08-27 DIAGNOSIS — Z85118 Personal history of other malignant neoplasm of bronchus and lung: Secondary | ICD-10-CM

## 2018-08-27 DIAGNOSIS — Z8551 Personal history of malignant neoplasm of bladder: Secondary | ICD-10-CM | POA: Diagnosis not present

## 2018-08-27 DIAGNOSIS — Z902 Acquired absence of lung [part of]: Secondary | ICD-10-CM | POA: Diagnosis not present

## 2018-08-27 DIAGNOSIS — E785 Hyperlipidemia, unspecified: Secondary | ICD-10-CM | POA: Diagnosis not present

## 2018-08-27 DIAGNOSIS — Z7951 Long term (current) use of inhaled steroids: Secondary | ICD-10-CM

## 2018-08-27 DIAGNOSIS — N184 Chronic kidney disease, stage 4 (severe): Secondary | ICD-10-CM | POA: Diagnosis present

## 2018-08-27 DIAGNOSIS — J329 Chronic sinusitis, unspecified: Secondary | ICD-10-CM | POA: Diagnosis not present

## 2018-08-27 DIAGNOSIS — Z8679 Personal history of other diseases of the circulatory system: Secondary | ICD-10-CM

## 2018-08-27 DIAGNOSIS — Z8261 Family history of arthritis: Secondary | ICD-10-CM

## 2018-08-27 DIAGNOSIS — K219 Gastro-esophageal reflux disease without esophagitis: Secondary | ICD-10-CM | POA: Diagnosis not present

## 2018-08-27 DIAGNOSIS — R4702 Dysphasia: Secondary | ICD-10-CM | POA: Diagnosis not present

## 2018-08-27 DIAGNOSIS — Z87442 Personal history of urinary calculi: Secondary | ICD-10-CM

## 2018-08-27 DIAGNOSIS — Z9089 Acquired absence of other organs: Secondary | ICD-10-CM | POA: Diagnosis not present

## 2018-08-27 DIAGNOSIS — E1136 Type 2 diabetes mellitus with diabetic cataract: Secondary | ICD-10-CM | POA: Diagnosis not present

## 2018-08-27 DIAGNOSIS — Z7902 Long term (current) use of antithrombotics/antiplatelets: Secondary | ICD-10-CM

## 2018-08-27 DIAGNOSIS — E1142 Type 2 diabetes mellitus with diabetic polyneuropathy: Secondary | ICD-10-CM | POA: Diagnosis present

## 2018-08-27 DIAGNOSIS — J9601 Acute respiratory failure with hypoxia: Secondary | ICD-10-CM | POA: Diagnosis present

## 2018-08-27 DIAGNOSIS — Z9989 Dependence on other enabling machines and devices: Secondary | ICD-10-CM

## 2018-08-27 DIAGNOSIS — Z832 Family history of diseases of the blood and blood-forming organs and certain disorders involving the immune mechanism: Secondary | ICD-10-CM

## 2018-08-27 DIAGNOSIS — E1121 Type 2 diabetes mellitus with diabetic nephropathy: Secondary | ICD-10-CM | POA: Diagnosis not present

## 2018-08-27 DIAGNOSIS — Z86010 Personal history of colonic polyps: Secondary | ICD-10-CM

## 2018-08-27 DIAGNOSIS — Z66 Do not resuscitate: Secondary | ICD-10-CM | POA: Diagnosis not present

## 2018-08-27 DIAGNOSIS — J209 Acute bronchitis, unspecified: Secondary | ICD-10-CM | POA: Diagnosis not present

## 2018-08-27 DIAGNOSIS — Z885 Allergy status to narcotic agent status: Secondary | ICD-10-CM

## 2018-08-27 DIAGNOSIS — I10 Essential (primary) hypertension: Secondary | ICD-10-CM

## 2018-08-27 DIAGNOSIS — D693 Immune thrombocytopenic purpura: Secondary | ICD-10-CM | POA: Diagnosis present

## 2018-08-27 DIAGNOSIS — I2510 Atherosclerotic heart disease of native coronary artery without angina pectoris: Secondary | ICD-10-CM | POA: Diagnosis present

## 2018-08-27 DIAGNOSIS — Z9079 Acquired absence of other genital organ(s): Secondary | ICD-10-CM

## 2018-08-27 DIAGNOSIS — Z936 Other artificial openings of urinary tract status: Secondary | ICD-10-CM | POA: Diagnosis not present

## 2018-08-27 DIAGNOSIS — Z823 Family history of stroke: Secondary | ICD-10-CM

## 2018-08-27 DIAGNOSIS — Z8249 Family history of ischemic heart disease and other diseases of the circulatory system: Secondary | ICD-10-CM

## 2018-08-27 DIAGNOSIS — Z888 Allergy status to other drugs, medicaments and biological substances status: Secondary | ICD-10-CM

## 2018-08-27 DIAGNOSIS — Z8619 Personal history of other infectious and parasitic diseases: Secondary | ICD-10-CM

## 2018-08-27 DIAGNOSIS — Z825 Family history of asthma and other chronic lower respiratory diseases: Secondary | ICD-10-CM

## 2018-08-27 DIAGNOSIS — Z800 Family history of malignant neoplasm of digestive organs: Secondary | ICD-10-CM

## 2018-08-27 DIAGNOSIS — Z8719 Personal history of other diseases of the digestive system: Secondary | ICD-10-CM

## 2018-08-27 DIAGNOSIS — R05 Cough: Secondary | ICD-10-CM | POA: Diagnosis not present

## 2018-08-27 DIAGNOSIS — R0602 Shortness of breath: Secondary | ICD-10-CM | POA: Diagnosis not present

## 2018-08-27 DIAGNOSIS — Z87891 Personal history of nicotine dependence: Secondary | ICD-10-CM

## 2018-08-27 DIAGNOSIS — Z820 Family history of epilepsy and other diseases of the nervous system: Secondary | ICD-10-CM

## 2018-08-27 LAB — COMPREHENSIVE METABOLIC PANEL
ALT: 8 U/L (ref 0–44)
AST: 10 U/L — ABNORMAL LOW (ref 15–41)
Albumin: 3.3 g/dL — ABNORMAL LOW (ref 3.5–5.0)
Alkaline Phosphatase: 51 U/L (ref 38–126)
Anion gap: 8 (ref 5–15)
BUN: 45 mg/dL — ABNORMAL HIGH (ref 8–23)
CO2: 23 mmol/L (ref 22–32)
CREATININE: 3.25 mg/dL — AB (ref 0.61–1.24)
Calcium: 8.8 mg/dL — ABNORMAL LOW (ref 8.9–10.3)
Chloride: 111 mmol/L (ref 98–111)
GFR calc Af Amer: 19 mL/min — ABNORMAL LOW (ref >60)
GFR calc non Af Amer: 17 mL/min — ABNORMAL LOW (ref >60)
Glucose, Bld: 127 mg/dL — ABNORMAL HIGH (ref 70–99)
Potassium: 4.2 mmol/L (ref 3.5–5.1)
Sodium: 142 mmol/L (ref 135–145)
TOTAL PROTEIN: 6.8 g/dL (ref 6.5–8.1)
Total Bilirubin: 1.5 mg/dL — ABNORMAL HIGH (ref 0.3–1.2)

## 2018-08-27 LAB — CBC WITH DIFFERENTIAL/PLATELET
Abs Immature Granulocytes: 0.06 10*3/uL (ref 0.00–0.07)
Basophils Absolute: 0 10*3/uL (ref 0.0–0.1)
Basophils Relative: 0 %
EOS ABS: 0.2 10*3/uL (ref 0.0–0.5)
EOS PCT: 2 %
HCT: 29.7 % — ABNORMAL LOW (ref 39.0–52.0)
Hemoglobin: 8.7 g/dL — ABNORMAL LOW (ref 13.0–17.0)
Immature Granulocytes: 1 %
Lymphocytes Relative: 12 %
Lymphs Abs: 1.1 10*3/uL (ref 0.7–4.0)
MCH: 30.1 pg (ref 26.0–34.0)
MCHC: 29.3 g/dL — ABNORMAL LOW (ref 30.0–36.0)
MCV: 102.8 fL — ABNORMAL HIGH (ref 80.0–100.0)
Monocytes Absolute: 0.7 10*3/uL (ref 0.1–1.0)
Monocytes Relative: 8 %
Neutro Abs: 6.9 10*3/uL (ref 1.7–7.7)
Neutrophils Relative %: 77 %
PLATELETS: 91 10*3/uL — AB (ref 150–400)
RBC: 2.89 MIL/uL — ABNORMAL LOW (ref 4.22–5.81)
RDW: 18.1 % — AB (ref 11.5–15.5)
WBC: 9 10*3/uL (ref 4.0–10.5)
nRBC: 0 % (ref 0.0–0.2)

## 2018-08-27 MED ORDER — IPRATROPIUM-ALBUTEROL 0.5-2.5 (3) MG/3ML IN SOLN
3.0000 mL | Freq: Four times a day (QID) | RESPIRATORY_TRACT | Status: DC | PRN
  Administered 2018-08-27: 3 mL via RESPIRATORY_TRACT

## 2018-08-27 MED ORDER — IPRATROPIUM-ALBUTEROL 0.5-2.5 (3) MG/3ML IN SOLN: 3.0000 mL | Freq: Four times a day (QID) | RESPIRATORY_TRACT | Status: DC

## 2018-08-27 MED ORDER — METHYLPREDNISOLONE SODIUM SUCC 125 MG IJ SOLR
125.0000 mg | Freq: Once | INTRAMUSCULAR | Status: AC
  Administered 2018-08-28: 125 mg via INTRAVENOUS
  Filled 2018-08-27: qty 2

## 2018-08-27 MED ORDER — DOXYCYCLINE HYCLATE 100 MG PO TABS: 100.0000 mg | ORAL_TABLET | Freq: Two times a day (BID) | ORAL | 0 refills | Status: DC

## 2018-08-27 MED ORDER — IPRATROPIUM-ALBUTEROL 0.5-2.5 (3) MG/3ML IN SOLN
RESPIRATORY_TRACT | Status: AC
  Filled 2018-08-27: qty 3

## 2018-08-27 MED ORDER — IPRATROPIUM-ALBUTEROL 0.5-2.5 (3) MG/3ML IN SOLN
3.0000 mL | Freq: Once | RESPIRATORY_TRACT | Status: AC
  Administered 2018-08-28: 3 mL via RESPIRATORY_TRACT
  Filled 2018-08-27: qty 3

## 2018-08-27 NOTE — ED Notes (Signed)
Pt given Sprite with graham crackers and peanut butter

## 2018-08-27 NOTE — ED Triage Notes (Signed)
Pt in by [facility], seen today at [facility] for upper resp infection, started on antibiotics for same, sob with exertion.

## 2018-08-27 NOTE — Progress Notes (Signed)
   Subjective:    Patient ID: Kyle [name], male    DOB: 1933/09/17, 85 y.o.   MRN: [id number]  Cough  This is a new problem. The current episode started 1 to 4 weeks ago. Associated symptoms include ear pain, nasal congestion and wheezing. Associated symptoms comments: Watery eyes. Treatments tried: amoxil.   Pt having sig coughing and cong  Using five handerkerhifs per day with stuff comig   Feeling wheezy int the chse Patient given a round of amoxicillin recently for similar symptoms.  Helped transiently.  Now symptoms are back since medication.  Feels wheezy at times.  Uses his albuterol as needed.  No fever appetite decent  Review of Systems  HENT: Positive for ear pain.   Respiratory: Positive for cough and wheezing.        Objective:   Physical Exam  Alert chronically ill-appearing.  No acute distress HEENT mild nasal congestion TMs good pharynx good.  Lungs no wheezes currently no tachypnea some bronchial sounds      Assessment & Plan:  Impression exacerbation COPD along with element of persistent sinusitis plan antibiotics prescribed symptom care discussed.  No steroids at this time.  Warning signs discussed carefully

## 2018-08-27 NOTE — ED Notes (Signed)
Patient called needing assistance to use the urinal. Specimen saved.

## 2018-08-27 NOTE — ED Provider Notes (Signed)
[facility] EMERGENCY DEPARTMENT Provider Note   CSN: [id number] Arrival date & time: 08/27/18  1951     History   Chief Complaint Chief Complaint  Patient presents with  . Shortness of Breath    HPI Kyle [name] is a 85 y.o. male.  Patient brought in by EMS.  For sudden onset of weakness and upper respiratory infection.  Was seen by his primary care doctor today and started on the second course of antibiotics.  First course of antibiotics his breathing a little better but then he started to get worse again does have shortness of breath with exertion.  Patient does not have home oxygen.  But he does use CPAP at night.  Patient is known to have COPD.  He is on pro-air.  Current antibiotic is doxycycline.  Patient here is on 3 L of oxygen sats are in the low 90s.  Again patient not normally on any oxygen.  Patient feels as if he is having some wheezing.  But he has been very weak and has difficulty standing.  No fevers.  No symptoms really consistent with flu like illness.  And family also agrees that all think he has the flu.     Past Medical History:  Diagnosis Date  . AAA (abdominal aortic aneurysm) ([place]) 2004   s/p repair 2004; 4.3 cm infrarenal in 05/2011  . Abnormality of gait 02/23/2013  . Adenocarcinoma of right lung ([place]) 02/24/2011   Ct A/P 2012:  2cm lung mass RLL PET 6644:  Hypermetabolic RLL mass, no other hypermetabolic areas. TTNA 02/2011:  Adenocarcinoma, markers c/w lung origin Right lower lobe superior segmentectomy. 04/01/2011 Dr. Arlyce Dice   . Adenocarcinoma, lung ([place]) 01/2011   transthoracic FNA; resection of the superior segment of the RLL in 03/2011; negative nodes; no chemotherapy nor radiation planned  . Arm fracture    right arm  . Arteriosclerotic cardiovascular disease (ASCVD) 1973, 12/2010   S/P NSTEMI secondary to distal RCA/PL lesion, tx medically.  EF of  55%-60% per  echo.  . Benign prostatic hypertrophy    s/p transurethral resection of the prostate  .  Bilateral renal masses    Cystic, more prominent on CT in 12/2010 than 2007; followed by Dr. Rosana Hoes  . Bladder cancer [facility]) 1996   Transurethral resection of the bladder + chemotherapy/BCG as premed  . Cataract   . Chronic kidney disease    Creatinine 1.4 on discharge 12/20/2100; proteinuria; normal renal ultrasound in 2010; recent creatinines of 1.7-2.; Bilateral cystic renal masses by CT in 2011  . COPD (chronic obstructive pulmonary disease) ([place])   . Coronary artery disease   . Cough    thick phlegm  . Diabetes mellitus    Type II  . Diplopia 02/23/2013  . Diverticulosis   . Essential and other specified forms of tremor 02/23/2013  . Hx of Clostridium difficile infection   . Hyperlipidemia   . Hypertension   . Insomnia   . ITP (idiopathic thrombocytopenic purpura) 09/07/2012   Chronic ITP of adults versus medication-induced ITP.  Stable  . Myocardial infarction ([place])   . Nephrolithiasis 2012   ARF in 01/2011 due to obstructing nephrolithiasis  . Obesity   . OSA (obstructive sleep apnea)   . Polyneuropathy in diabetes(357.2) 02/23/2013  . Thrombocytopenia ([place])   . Tobacco abuse    50-pack-year consumption; quit in 12/2010  . Tubular adenoma of colon   . Ventral hernia     Patient Active Problem List   Diagnosis  Date Noted  . Stage 4 chronic kidney disease ([place]) 06/30/2016  . Subdural hematoma, post-traumatic ([place]) 03/21/2016  . Subdural hematoma ([place]) 03/21/2016  . Malignant neoplasm of trigone of urinary bladder ([place]) 02/12/2016  . Bilirubinemia 12/12/2015  . Thrombocytopenia ([place]) 11/13/2015  . Cough 10/03/2015  . Allergic rhinitis 10/03/2015  . Secondary hyperparathyroidism ([place]) 09/06/2015  . Tremor, essential 08/27/2015  . History of colonic polyps   . Diverticulosis of colon without hemorrhage   . Malignant neoplasm of lateral wall of urinary bladder ([place]) 01/12/2014  . CIS (carcinoma in situ of bladder) 08/22/2013  . Static tremor 02/23/2013  . Diplopia 02/23/2013    . Diabetic polyneuropathy ([place]) 02/23/2013  . Abnormality of gait 02/23/2013  . COPD exacerbation ([place]) 01/31/2013  . Fracture of right distal radius 12/14/2012  . Diabetes mellitus ([place]) 11/30/2012  . Idiopathic thrombocytopenic purpura ([place]) 09/07/2012  . Depression 07/28/2012  . Benign prostate hyperplasia 06/10/2012  . LLQ pain 11/13/2011  . Hernia of anterior abdominal wall 10/28/2011  . AAA (abdominal aortic aneurysm) ([place])   . Tobacco abuse   . Diarrhea 10/22/2011  . Tubular adenoma of colon   . Bladder neck obstruction 06/05/2011  . Obstructive sleep apnea on CPAP 03/27/2011  . Fasting hyperglycemia 03/27/2011  . Recurrent nephrolithiasis 03/27/2011  . Carcinoma of bladder ([place]) 03/27/2011  . COPD (chronic obstructive pulmonary disease) ([place]) 02/24/2011  . Adenocarcinoma of right lung ([place]) 02/24/2011  . CAD (coronary artery disease) 01/06/2011  . Hypertension 01/06/2011  . Chronic kidney disease, stage III (moderate) ([place]) 01/06/2011    Past Surgical History:  Procedure Laterality Date  . ABDOMINAL AORTIC ANEURYSM REPAIR  2004  . CARDIAC CATHETERIZATION    . COLONOSCOPY  03/19/2010   Dr. Gala Romney -(poor prep) Anal papilla, rectal hyperplastic polyp, tubular adenoma removed splenic flexure, left-sided diverticula  . COLONOSCOPY  11/28/2004   RMR:  Diminutive rectal and left colon polyps as described above, cold  biopsied/removed/  Left sided diverticula. The remainder of the colonic mucosa appeared normal.  . COLONOSCOPY   09/15/01   RMR: Multiple diminutive polyps destroyed with dermolysis as described above/ Multiple small polyps on stalks in the colon resected with snare cautery/ Scattered pan colonic diverticulum/ The remainder of the colonic mucosa appeared normal  . COLONOSCOPY N/A 05/01/2015   Procedure: COLONOSCOPY;  Surgeon: Daneil Dolin, MD;  Location: [facility];  Service: Endoscopy;  Laterality: N/A;  1115  . CYSTECTOMY    . CYSTOSCOPY  04/2014  .  CYSTOSTOMY W/ BLADDER BIOPSY    . FLEXIBLE SIGMOIDOSCOPY  2014   Dr. Olevia Perches: tubular adenoma, negative stool studies   . IR EXT NEPHROURETERAL CATH EXCHANGE  07/15/2018  . LUNG LOBECTOMY    . TONSILLECTOMY    . TRANSURETHRAL RESECTION OF PROSTATE    . VIDEO BRONCHOSCOPY WITH ENDOBRONCHIAL NAVIGATION N/A 10/10/2013   Procedure: VIDEO BRONCHOSCOPY WITH ENDOBRONCHIAL NAVIGATION;  Surgeon: Melrose Nakayama, MD;  Location: [place];  Service: Thoracic;  Laterality: N/A;  NO BLOOD THINNERS BUT PATIENT HAS ITP  . WEDGE RESECTION  04/2011   carcinoma of lung        Home Medications    Prior to Admission medications   Medication Sig Start Date End Date Taking? Authorizing Provider  Acetylcysteine (NAC PO) Take 1 capsule by mouth 2 (two) times daily.   Yes [provider]  albuterol (PROAIR HFA) 108 (90 Base) MCG/ACT inhaler INHALE 2 PUFFS INTO THE LUNGS 4 TIMES DAILY AS NEEDED. Patient taking differently:  Inhale 2 puffs into the lungs every 4 (four) hours as needed for shortness of breath. INHALE 2 PUFFS INTO THE LUNGS 4 TIMES DAILY AS NEEDED. 07/28/18  Yes McQuaid, Ronie Spies, MD  ALPRAZolam (XANAX) 0.5 MG tablet TAKE ONE TABLET TWICE DAILY AS NEEDED FOR ANXIETY. Patient taking differently: Take 0.5 mg by mouth at bedtime. *May take an additional tablet daily as needed for anxiety/sleep 07/23/18  Yes Mikey Kirschner, MD  aspirin EC 81 MG tablet Take 81 mg by mouth every morning.    Yes [provider]  cholecalciferol (VITAMIN D) 1000 units tablet Take 2,000 Units by mouth every morning.    Yes [provider]  citalopram (CELEXA) 20 MG tablet TAKE ONE (1) TABLET BY MOUTH EVERY DAY Patient taking differently: Take 20 mg by mouth every morning.  08/11/18  Yes Mikey Kirschner, MD  clopidogrel (PLAVIX) 75 MG tablet Take 75 mg by mouth every morning.   Yes [provider]  Cyanocobalamin (B-12) 2500 MCG TABS Take 1 tablet by mouth every morning.   Yes [provider]  doxycycline (VIBRA-TABS) 100 MG tablet Take 1 tablet (100 mg total) by mouth 2 (two) times daily. 08/27/18  Yes Mikey Kirschner, MD  fluticasone (FLONASE) 50 MCG/ACT nasal spray Place 1 spray into both nostrils daily. Patient taking differently: Place 1 spray into both nostrils every morning.  11/09/17  Yes Mikey Kirschner, MD  metoprolol tartrate (LOPRESSOR) 50 MG tablet TAKE ONE-HALF TABLET BY MOUTH TWICE DAILY Patient taking differently: Take 25 mg by mouth 2 (two) times daily.  08/11/18  Yes Mikey Kirschner, MD  Misc Natural Products (OSTEO BI-FLEX JOINT SHIELD) TABS Take 1 tablet by mouth 2 (two) times daily.   Yes [provider]  mometasone (ELOCON) 0.1 % cream Apply 1 application topically daily. Affected area Patient taking differently: Apply 1 application topically daily as needed (for irritation). Affected area 02/03/18  Yes Mikey Kirschner, MD  niacin (NIASPAN) 500 MG CR tablet Take 2 tablets (1,000 mg total) by mouth at bedtime. Patient taking differently: Take 1,000 mg by mouth every evening.  10/20/16  Yes Mikey Kirschner, MD  nitroGLYCERIN (NITROSTAT) 0.4 MG SL tablet Place 1 tablet (0.4 mg total) under the tongue every 5 (five) minutes as needed. Call MD if need more than 2 Patient taking differently: Place 0.4 mg under the tongue every 5 (five) minutes as needed for chest pain. Call MD if need more than 2 04/18/16  Yes Mikey Kirschner, MD  OLANZapine (ZYPREXA) 5 MG tablet Take 5 mg by mouth every evening.    Yes [provider]  OXYGEN Inhale 2.5 L into the lungs at bedtime. With CPAP   Yes [provider]  pravastatin (PRAVACHOL) 80 MG tablet TAKE ONE (1) TABLET EACH DAY Patient taking differently: Take 80 mg by mouth every evening.  08/11/18  Yes Mikey Kirschner, MD  Probiotic Product (PROBIOTIC PEARLS ADVANTAGE) CAPS Take 1 capsule by mouth daily.   Yes [provider]  sodium bicarbonate 650 MG tablet Take 1,300 mg by  mouth 2 (two) times daily.   Yes [provider]  SSD 1 % cream Apply 1 application topically daily.  06/09/18  Yes [provider]  tiotropium (SPIRIVA HANDIHALER) 18 MCG inhalation capsule PLACE ONE CAPSULE INTO INHALER AND INHALE DAILY Patient taking differently: Place 18 mcg into inhaler and inhale every morning.  07/23/18  Yes Kathyrn Drown, MD  triamcinolone cream (KENALOG)  0.1 % Apply 1 application topically 2 (two) times daily.    Yes [provider]  amoxicillin (AMOXIL) 500 MG capsule Take 1 capsule (500 mg total) by mouth 3 (three) times daily. Patient not taking: Reported on 08/27/2018 08/11/18   Mikey Kirschner, MD  ipratropium-albuterol (DUONEB) 0.5-2.5 (3) MG/3ML SOLN Take 3 mLs by nebulization every 4 (four) hours as needed. Patient not taking: Reported on 08/27/2018 06/20/18   Kinnie Feil, PA-C  budesonide-formoterol [facility]) 160-4.5 MCG/ACT inhaler Inhale 2 puffs into the lungs 2 (two) times daily.    10/06/11  [provider]    Family History Family History  Problem Relation Age of Onset  . Aortic aneurysm Mother   . Emphysema Father        smoker  . Clotting disorder Father   . Arthritis Father   . Hypertension Father   . Diabetes Father   . Aortic aneurysm Father   . Tremor Father   . Stroke Paternal Grandmother   . Other Paternal Grandfather        brain aneurysm  . Colon cancer Cousin     Social History Social History   Tobacco Use  . Smoking status: Former Smoker    Packs/day: 1.00    Years: 50.00    Pack years: 50.00    Types: Cigarettes    Start date: 07/22/1955  . Smokeless tobacco: Never Used  . Tobacco comment: "smokes a few days a week" 09/29/17  Substance Use Topics  . Alcohol use: No    Alcohol/week: 0.0 standard drinks  . Drug use: No     Allergies   Codeine; Etodolac; and Zanaflex [tizanidine hcl]   Review of Systems Review of Systems  Constitutional: Positive for fatigue. Negative for  chills and fever.  HENT: Positive for congestion. Negative for rhinorrhea and sore throat.   Eyes: Negative for visual disturbance.  Respiratory: Positive for shortness of breath and wheezing. Negative for cough.   Cardiovascular: Negative for chest pain and leg swelling.  Gastrointestinal: Negative for abdominal pain, diarrhea, nausea and vomiting.  Genitourinary: Negative for dysuria.  Musculoskeletal: Negative for back pain and neck pain.  Skin: Negative for rash.  Neurological: Positive for weakness. Negative for dizziness, light-headedness and headaches.  Hematological: Does not bruise/bleed easily.  Psychiatric/Behavioral: Negative for confusion.     Physical Exam Updated Vital Signs BP (!) 148/70   Pulse 82   Temp 98.3 F (36.8 C) (Oral)   Resp 18   Ht 1.829 m (6')   Wt 108 kg   SpO2 97%   BMI 32.28 kg/m   Physical Exam Vitals signs and nursing note reviewed.  Constitutional:      Appearance: He is well-developed.  HENT:     Head: Normocephalic and atraumatic.     Mouth/Throat:     Mouth: Mucous membranes are dry.  Eyes:     Extraocular Movements: Extraocular movements intact.     Conjunctiva/sclera: Conjunctivae normal.     Pupils: Pupils are equal, round, and reactive to light.  Neck:     Musculoskeletal: Normal range of motion and neck supple. No neck rigidity.  Cardiovascular:     Rate and Rhythm: Normal rate and regular rhythm.     Heart sounds: Normal heart sounds. No murmur.  Pulmonary:     Effort: Pulmonary effort is normal. No respiratory distress.     Breath sounds: Wheezing present.  Abdominal:     General: Bowel sounds are normal.  Palpations: Abdomen is soft.     Tenderness: There is no abdominal tenderness.  Musculoskeletal: Normal range of motion.        General: No swelling.     Comments: Patient has good 2+ dorsalis pedis pulses to both feet.  Good cap refill.  Family did raise some concern about arterial insufficiency and he is on  Plavix.  Patient without any pain.  Skin:    General: Skin is warm and dry.     Capillary Refill: Capillary refill takes less than 2 seconds.  Neurological:     General: No focal deficit present.     Mental Status: He is alert and oriented to person, place, and time.     Cranial Nerves: No cranial nerve deficit.     Motor: No weakness.     Comments: On neuro exam patient has good movement of both lower extremities but there is some weakness.  Equal bilaterally.  But not suggestive of a focal deficit.      ED Treatments / Results  Labs (all labs ordered are listed, but only abnormal results are displayed) Labs Reviewed  COMPREHENSIVE METABOLIC PANEL - Abnormal; Notable for the following components:      Result Value   Glucose, Bld 127 (*)    BUN 45 (*)    Creatinine, Ser 3.25 (*)    Calcium 8.8 (*)    Albumin 3.3 (*)    AST 10 (*)    Total Bilirubin 1.5 (*)    GFR calc non Af Amer 17 (*)    GFR calc Af Amer 19 (*)    All other components within normal limits  CBC WITH DIFFERENTIAL/PLATELET - Abnormal; Notable for the following components:   RBC 2.89 (*)    Hemoglobin 8.7 (*)    HCT 29.7 (*)    MCV 102.8 (*)    MCHC 29.3 (*)    RDW 18.1 (*)    Platelets 91 (*)    All other components within normal limits    EKG None  Radiology Dg Chest 2 View  Result Date: 08/27/2018 CLINICAL DATA:  Shortness of breath with exertion. History of bladder and lung cancer. EXAM: CHEST - 2 VIEW COMPARISON:  Chest radiograph June 20, 2018 FINDINGS: Cardiac silhouette is mildly enlarged. Tortuous, but possibly ectatic calcified aorta. Chronic interstitial changes with strandy densities RIGHT lung base. Similar small pleural effusion versus pleural thickening posteriorly. No pneumothorax. Soft tissue planes and included osseous structures are unchanged. Osteopenia. IMPRESSION: 1. Stable cardiomegaly. 2. Chronic interstitial changes/COPD with RIGHT lung base atelectasis. Small posterior pleural  effusion versus pleural thickening. 3.  Aortic Atherosclerosis (ICD10-I70.0). Electronically Signed   By: Elon Alas M.D.   On: 08/27/2018 21:17    Procedures Procedures (including critical care time)  Medications Ordered in ED Medications  ipratropium-albuterol (DUONEB) 0.5-2.5 (3) MG/3ML nebulizer solution 3 mL (3 mLs Nebulization Given 08/27/18 2203)  ipratropium-albuterol (DUONEB) 0.5-2.5 (3) MG/3ML nebulizer solution (  Not Given 08/27/18 2203)  ipratropium-albuterol (DUONEB) 0.5-2.5 (3) MG/3ML nebulizer solution 3 mL (has no administration in time range)  methylPREDNISolone sodium succinate (SOLU-MEDROL) 125 mg/2 mL injection 125 mg (has no administration in time range)     Initial Impression / Assessment and Plan / ED Course  I have reviewed the triage vital signs and the nursing notes.  Pertinent labs & imaging results that were available during my care of the patient were reviewed by me and considered in my medical decision making (see chart for details).  Patient without flulike symptoms.  But may very well have an upper respiratory infection to explain his fatigue and generalized weakness.  Did have some wheezing when he first arrived received nebulizer treatment.  Wheezing pretty much resolved with that but patient still had an oxygen requirement chest x-ray negative for pneumonia.  Patient received Solu-Medrol and receiving second nebulizer treatment.  When he first arrived he required 3 L of oxygen to maintain his sats into the low 90s.  Now on 3 L he satting around 95%.  But if it is removed he will desat.  So I think this is probably an exacerbation of COPD with a component of bronchitis may be upper respiratory infection.  Patient not tachycardic.  No concerns for pulmonary embolus.  We will discuss with hospitalist for admission due to exacerbation of the COPD and bronchitis.   Final Clinical Impressions(s) / ED Diagnoses   Final diagnoses:  COPD exacerbation [facility])   Hypoxia    ED Discharge Orders    None       Fredia Sorrow, MD 08/28/18 [phone]

## 2018-08-28 ENCOUNTER — Encounter (HOSPITAL_COMMUNITY): Payer: Self-pay

## 2018-08-28 DIAGNOSIS — R262 Difficulty in walking, not elsewhere classified: Secondary | ICD-10-CM | POA: Diagnosis not present

## 2018-08-28 DIAGNOSIS — D693 Immune thrombocytopenic purpura: Secondary | ICD-10-CM | POA: Diagnosis not present

## 2018-08-28 DIAGNOSIS — J441 Chronic obstructive pulmonary disease with (acute) exacerbation: Secondary | ICD-10-CM

## 2018-08-28 DIAGNOSIS — E1129 Type 2 diabetes mellitus with other diabetic kidney complication: Secondary | ICD-10-CM | POA: Diagnosis not present

## 2018-08-28 DIAGNOSIS — E785 Hyperlipidemia, unspecified: Secondary | ICD-10-CM | POA: Diagnosis not present

## 2018-08-28 DIAGNOSIS — G250 Essential tremor: Secondary | ICD-10-CM | POA: Diagnosis not present

## 2018-08-28 DIAGNOSIS — R1310 Dysphagia, unspecified: Secondary | ICD-10-CM | POA: Diagnosis present

## 2018-08-28 DIAGNOSIS — I252 Old myocardial infarction: Secondary | ICD-10-CM | POA: Diagnosis not present

## 2018-08-28 DIAGNOSIS — N184 Chronic kidney disease, stage 4 (severe): Secondary | ICD-10-CM | POA: Diagnosis not present

## 2018-08-28 DIAGNOSIS — E1121 Type 2 diabetes mellitus with diabetic nephropathy: Secondary | ICD-10-CM | POA: Diagnosis not present

## 2018-08-28 DIAGNOSIS — E1142 Type 2 diabetes mellitus with diabetic polyneuropathy: Secondary | ICD-10-CM | POA: Diagnosis present

## 2018-08-28 DIAGNOSIS — Z9079 Acquired absence of other genital organ(s): Secondary | ICD-10-CM | POA: Diagnosis not present

## 2018-08-28 DIAGNOSIS — M6281 Muscle weakness (generalized): Secondary | ICD-10-CM | POA: Diagnosis not present

## 2018-08-28 DIAGNOSIS — Z902 Acquired absence of lung [part of]: Secondary | ICD-10-CM | POA: Diagnosis not present

## 2018-08-28 DIAGNOSIS — Z66 Do not resuscitate: Secondary | ICD-10-CM | POA: Diagnosis present

## 2018-08-28 DIAGNOSIS — R1312 Dysphagia, oropharyngeal phase: Secondary | ICD-10-CM | POA: Diagnosis not present

## 2018-08-28 DIAGNOSIS — I1310 Hypertensive heart and chronic kidney disease without heart failure, with stage 1 through stage 4 chronic kidney disease, or unspecified chronic kidney disease: Secondary | ICD-10-CM | POA: Diagnosis not present

## 2018-08-28 DIAGNOSIS — K5790 Diverticulosis of intestine, part unspecified, without perforation or abscess without bleeding: Secondary | ICD-10-CM | POA: Diagnosis not present

## 2018-08-28 DIAGNOSIS — Z741 Need for assistance with personal care: Secondary | ICD-10-CM | POA: Diagnosis not present

## 2018-08-28 DIAGNOSIS — Z906 Acquired absence of other parts of urinary tract: Secondary | ICD-10-CM | POA: Diagnosis not present

## 2018-08-28 DIAGNOSIS — J440 Chronic obstructive pulmonary disease with acute lower respiratory infection: Secondary | ICD-10-CM | POA: Diagnosis not present

## 2018-08-28 DIAGNOSIS — J9601 Acute respiratory failure with hypoxia: Secondary | ICD-10-CM | POA: Diagnosis present

## 2018-08-28 DIAGNOSIS — K219 Gastro-esophageal reflux disease without esophagitis: Secondary | ICD-10-CM | POA: Diagnosis not present

## 2018-08-28 DIAGNOSIS — Z936 Other artificial openings of urinary tract status: Secondary | ICD-10-CM | POA: Diagnosis not present

## 2018-08-28 DIAGNOSIS — Z9989 Dependence on other enabling machines and devices: Secondary | ICD-10-CM | POA: Diagnosis not present

## 2018-08-28 DIAGNOSIS — Z8551 Personal history of malignant neoplasm of bladder: Secondary | ICD-10-CM | POA: Diagnosis not present

## 2018-08-28 DIAGNOSIS — Z9981 Dependence on supplemental oxygen: Secondary | ICD-10-CM | POA: Diagnosis not present

## 2018-08-28 DIAGNOSIS — Z436 Encounter for attention to other artificial openings of urinary tract: Secondary | ICD-10-CM | POA: Diagnosis not present

## 2018-08-28 DIAGNOSIS — G4733 Obstructive sleep apnea (adult) (pediatric): Secondary | ICD-10-CM | POA: Diagnosis not present

## 2018-08-28 DIAGNOSIS — I2510 Atherosclerotic heart disease of native coronary artery without angina pectoris: Secondary | ICD-10-CM | POA: Diagnosis not present

## 2018-08-28 DIAGNOSIS — R0902 Hypoxemia: Secondary | ICD-10-CM

## 2018-08-28 DIAGNOSIS — Z9089 Acquired absence of other organs: Secondary | ICD-10-CM | POA: Diagnosis not present

## 2018-08-28 DIAGNOSIS — I129 Hypertensive chronic kidney disease with stage 1 through stage 4 chronic kidney disease, or unspecified chronic kidney disease: Secondary | ICD-10-CM | POA: Diagnosis present

## 2018-08-28 DIAGNOSIS — N281 Cyst of kidney, acquired: Secondary | ICD-10-CM | POA: Diagnosis not present

## 2018-08-28 DIAGNOSIS — Z85118 Personal history of other malignant neoplasm of bronchus and lung: Secondary | ICD-10-CM | POA: Diagnosis not present

## 2018-08-28 DIAGNOSIS — G4700 Insomnia, unspecified: Secondary | ICD-10-CM | POA: Diagnosis not present

## 2018-08-28 DIAGNOSIS — E1122 Type 2 diabetes mellitus with diabetic chronic kidney disease: Secondary | ICD-10-CM | POA: Diagnosis present

## 2018-08-28 DIAGNOSIS — J209 Acute bronchitis, unspecified: Secondary | ICD-10-CM | POA: Diagnosis not present

## 2018-08-28 DIAGNOSIS — H532 Diplopia: Secondary | ICD-10-CM | POA: Diagnosis not present

## 2018-08-28 DIAGNOSIS — E1136 Type 2 diabetes mellitus with diabetic cataract: Secondary | ICD-10-CM | POA: Diagnosis present

## 2018-08-28 DIAGNOSIS — J449 Chronic obstructive pulmonary disease, unspecified: Secondary | ICD-10-CM | POA: Diagnosis not present

## 2018-08-28 DIAGNOSIS — R4702 Dysphasia: Secondary | ICD-10-CM | POA: Diagnosis present

## 2018-08-28 DIAGNOSIS — J471 Bronchiectasis with (acute) exacerbation: Secondary | ICD-10-CM | POA: Diagnosis present

## 2018-08-28 LAB — CBC
HCT: 31.4 % — ABNORMAL LOW (ref 39.0–52.0)
HEMOGLOBIN: 9.3 g/dL — AB (ref 13.0–17.0)
MCH: 30.4 pg (ref 26.0–34.0)
MCHC: 29.6 g/dL — ABNORMAL LOW (ref 30.0–36.0)
MCV: 102.6 fL — ABNORMAL HIGH (ref 80.0–100.0)
Platelets: 84 10*3/uL — ABNORMAL LOW (ref 150–400)
RBC: 3.06 MIL/uL — AB (ref 4.22–5.81)
RDW: 18.1 % — ABNORMAL HIGH (ref 11.5–15.5)
WBC: 9.3 10*3/uL (ref 4.0–10.5)
nRBC: 0 % (ref 0.0–0.2)

## 2018-08-28 LAB — CBG MONITORING, ED: Glucose-Capillary: 182 mg/dL — ABNORMAL HIGH (ref 70–99)

## 2018-08-28 LAB — COMPREHENSIVE METABOLIC PANEL
ALT: 9 U/L (ref 0–44)
AST: 9 U/L — ABNORMAL LOW (ref 15–41)
Albumin: 3.4 g/dL — ABNORMAL LOW (ref 3.5–5.0)
Alkaline Phosphatase: 53 U/L (ref 38–126)
Anion gap: 9 (ref 5–15)
BUN: 42 mg/dL — ABNORMAL HIGH (ref 8–23)
CHLORIDE: 112 mmol/L — AB (ref 98–111)
CO2: 22 mmol/L (ref 22–32)
Calcium: 9 mg/dL (ref 8.9–10.3)
Creatinine, Ser: 2.97 mg/dL — ABNORMAL HIGH (ref 0.61–1.24)
GFR calc Af Amer: 22 mL/min — ABNORMAL LOW (ref >60)
GFR calc non Af Amer: 19 mL/min — ABNORMAL LOW (ref >60)
Glucose, Bld: 146 mg/dL — ABNORMAL HIGH (ref 70–99)
POTASSIUM: 4.1 mmol/L (ref 3.5–5.1)
Sodium: 143 mmol/L (ref 135–145)
Total Bilirubin: 1.7 mg/dL — ABNORMAL HIGH (ref 0.3–1.2)
Total Protein: 6.9 g/dL (ref 6.5–8.1)

## 2018-08-28 LAB — GLUCOSE, CAPILLARY
Glucose-Capillary: 278 mg/dL — ABNORMAL HIGH (ref 70–99)
Glucose-Capillary: 135 mg/dL — ABNORMAL HIGH (ref 70–99)
Glucose-Capillary: 170 mg/dL — ABNORMAL HIGH (ref 70–99)

## 2018-08-28 LAB — HEMOGLOBIN A1C
Hgb A1c MFr Bld: 5.2 % (ref 4.8–5.6)
Mean Plasma Glucose: 102.54 mg/dL

## 2018-08-28 MED ORDER — FLUTICASONE PROPIONATE 50 MCG/ACT NA SUSP
1.0000 | Freq: Every morning | NASAL | Status: DC
  Administered 2018-08-28 – 2018-08-31 (×4): 1 via NASAL
  Filled 2018-08-28: qty 16

## 2018-08-28 MED ORDER — ONDANSETRON HCL 4 MG PO TABS: 4.0000 mg | ORAL_TABLET | Freq: Four times a day (QID) | ORAL | Status: DC | PRN

## 2018-08-28 MED ORDER — ALPRAZOLAM 0.5 MG PO TABS
0.5000 mg | ORAL_TABLET | Freq: Every day | ORAL | Status: DC
  Administered 2018-08-28 – 2018-08-30 (×3): 0.5 mg via ORAL
  Filled 2018-08-28 (×3): qty 1

## 2018-08-28 MED ORDER — NIACIN ER 500 MG PO TBCR
1000.0000 mg | EXTENDED_RELEASE_TABLET | Freq: Every evening | ORAL | Status: DC
  Administered 2018-08-30: 1000 mg via ORAL
  Filled 2018-08-28 (×4): qty 2

## 2018-08-28 MED ORDER — ENOXAPARIN SODIUM 40 MG/0.4ML ~~LOC~~ SOLN
40.0000 mg | SUBCUTANEOUS | Status: DC
  Filled 2018-08-28: qty 0.4

## 2018-08-28 MED ORDER — ONDANSETRON HCL 4 MG/2ML IJ SOLN: 4.0000 mg | Freq: Four times a day (QID) | INTRAMUSCULAR | Status: DC | PRN

## 2018-08-28 MED ORDER — PRAVASTATIN SODIUM 40 MG PO TABS
80.0000 mg | ORAL_TABLET | Freq: Every evening | ORAL | Status: DC
  Administered 2018-08-28 – 2018-08-30 (×3): 80 mg via ORAL
  Filled 2018-08-28 (×3): qty 2

## 2018-08-28 MED ORDER — SODIUM CHLORIDE 0.9 % IV SOLN: 250.0000 mL | INTRAVENOUS | Status: DC | PRN

## 2018-08-28 MED ORDER — METHYLPREDNISOLONE SODIUM SUCC 125 MG IJ SOLR
60.0000 mg | Freq: Four times a day (QID) | INTRAMUSCULAR | Status: DC
  Administered 2018-08-28 – 2018-08-29 (×6): 60 mg via INTRAVENOUS
  Filled 2018-08-28 (×7): qty 2

## 2018-08-28 MED ORDER — METOPROLOL TARTRATE 25 MG PO TABS
25.0000 mg | ORAL_TABLET | Freq: Two times a day (BID) | ORAL | Status: DC
  Administered 2018-08-28 – 2018-08-31 (×6): 25 mg via ORAL
  Filled 2018-08-28 (×8): qty 1

## 2018-08-28 MED ORDER — SODIUM CHLORIDE 0.9% FLUSH: 3.0000 mL | INTRAVENOUS | Status: DC | PRN

## 2018-08-28 MED ORDER — GUAIFENESIN ER 600 MG PO TB12
600.0000 mg | ORAL_TABLET | Freq: Two times a day (BID) | ORAL | Status: DC
  Administered 2018-08-28 – 2018-08-31 (×7): 600 mg via ORAL
  Filled 2018-08-28 (×13): qty 1

## 2018-08-28 MED ORDER — ACETAMINOPHEN 325 MG PO TABS: 650.0000 mg | ORAL_TABLET | Freq: Four times a day (QID) | ORAL | Status: DC | PRN

## 2018-08-28 MED ORDER — ENOXAPARIN SODIUM 30 MG/0.3ML ~~LOC~~ SOLN
30.0000 mg | SUBCUTANEOUS | Status: DC
  Administered 2018-08-29 – 2018-08-31 (×3): 30 mg via SUBCUTANEOUS
  Filled 2018-08-28 (×3): qty 0.3

## 2018-08-28 MED ORDER — OLANZAPINE 5 MG PO TABS
5.0000 mg | ORAL_TABLET | Freq: Every evening | ORAL | Status: DC
  Administered 2018-08-28 – 2018-08-30 (×3): 5 mg via ORAL
  Filled 2018-08-28 (×3): qty 1

## 2018-08-28 MED ORDER — CITALOPRAM HYDROBROMIDE 20 MG PO TABS
20.0000 mg | ORAL_TABLET | Freq: Every morning | ORAL | Status: DC
  Administered 2018-08-28 – 2018-08-31 (×4): 20 mg via ORAL
  Filled 2018-08-28 (×4): qty 1

## 2018-08-28 MED ORDER — DOXYCYCLINE HYCLATE 100 MG PO TABS
100.0000 mg | ORAL_TABLET | Freq: Two times a day (BID) | ORAL | Status: DC
  Administered 2018-08-28 – 2018-08-31 (×7): 100 mg via ORAL
  Filled 2018-08-28 (×8): qty 1

## 2018-08-28 MED ORDER — BUDESONIDE 0.5 MG/2ML IN SUSP
0.5000 mg | Freq: Two times a day (BID) | RESPIRATORY_TRACT | Status: DC
  Administered 2018-08-28 – 2018-08-31 (×6): 0.5 mg via RESPIRATORY_TRACT
  Filled 2018-08-28 (×6): qty 2

## 2018-08-28 MED ORDER — SODIUM BICARBONATE 650 MG PO TABS
1300.0000 mg | ORAL_TABLET | Freq: Two times a day (BID) | ORAL | Status: DC
  Administered 2018-08-28 – 2018-08-31 (×7): 1300 mg via ORAL
  Filled 2018-08-28 (×12): qty 2

## 2018-08-28 MED ORDER — ARFORMOTEROL TARTRATE 15 MCG/2ML IN NEBU
15.0000 ug | INHALATION_SOLUTION | Freq: Two times a day (BID) | RESPIRATORY_TRACT | Status: DC
  Administered 2018-08-28 – 2018-08-31 (×6): 15 ug via RESPIRATORY_TRACT
  Filled 2018-08-28 (×6): qty 2

## 2018-08-28 MED ORDER — ACETAMINOPHEN 650 MG RE SUPP: 650.0000 mg | Freq: Four times a day (QID) | RECTAL | Status: DC | PRN

## 2018-08-28 MED ORDER — CLOPIDOGREL BISULFATE 75 MG PO TABS
75.0000 mg | ORAL_TABLET | Freq: Every morning | ORAL | Status: DC
  Administered 2018-08-28 – 2018-08-31 (×4): 75 mg via ORAL
  Filled 2018-08-28 (×4): qty 1

## 2018-08-28 MED ORDER — ASPIRIN EC 81 MG PO TBEC
81.0000 mg | DELAYED_RELEASE_TABLET | Freq: Every morning | ORAL | Status: DC
  Administered 2018-08-28 – 2018-08-31 (×4): 81 mg via ORAL
  Filled 2018-08-28 (×4): qty 1

## 2018-08-28 MED ORDER — IPRATROPIUM-ALBUTEROL 0.5-2.5 (3) MG/3ML IN SOLN
3.0000 mL | Freq: Four times a day (QID) | RESPIRATORY_TRACT | Status: DC
  Administered 2018-08-28 – 2018-08-31 (×12): 3 mL via RESPIRATORY_TRACT
  Filled 2018-08-28 (×12): qty 3

## 2018-08-28 MED ORDER — SODIUM CHLORIDE 0.9% FLUSH
3.0000 mL | Freq: Two times a day (BID) | INTRAVENOUS | Status: DC
  Administered 2018-08-28 – 2018-08-31 (×6): 3 mL via INTRAVENOUS

## 2018-08-28 MED ORDER — INSULIN ASPART 100 UNIT/ML ~~LOC~~ SOLN
0.0000 [IU] | Freq: Three times a day (TID) | SUBCUTANEOUS | Status: DC
  Administered 2018-08-28: 5 [IU] via SUBCUTANEOUS
  Administered 2018-08-28: 1 [IU] via SUBCUTANEOUS
  Administered 2018-08-28 – 2018-08-29 (×2): 3 [IU] via SUBCUTANEOUS
  Administered 2018-08-29: 7 [IU] via SUBCUTANEOUS
  Administered 2018-08-29 – 2018-08-30 (×2): 2 [IU] via SUBCUTANEOUS
  Administered 2018-08-30: 5 [IU] via SUBCUTANEOUS
  Administered 2018-08-30: 3 [IU] via SUBCUTANEOUS
  Administered 2018-08-31: 2 [IU] via SUBCUTANEOUS
  Administered 2018-08-31: 3 [IU] via SUBCUTANEOUS
  Administered 2018-08-31: 7 [IU] via SUBCUTANEOUS
  Filled 2018-08-28: qty 1

## 2018-08-28 NOTE — ED Notes (Signed)
Pt sleeping at this time.

## 2018-08-28 NOTE — H&P (Addendum)
TRH H&P    Patient Demographics:    Kyle [name], is a 85 y.o. male  MRN: [id number]  DOB - 1933/11/04  Admit Date - 08/27/2018  Referring MD/NP/PA: Dr. Bobby Rumpf  Outpatient Primary MD for the patient is Wolfgang Phoenix, Grace Bushy, MD  Patient coming from: Home  Chief complaint-shortness of breath   HPI:    Kyle [name]  is a 85 y.o. male,With history of adenocarcinoma of right lung status post right lower lobe superior segmentectomy in 2012, bladder cancer status post transurethral resection of the bladder, right nephrostomy tube in place, coronary artery disease, COPD, obstructive sleep apnea on CPAP at bedtime, hypertension came to hospital with worsening shortness of breath.  Patient says that he was seen by his PCP 10 days ago and was prescribed amoxicillin he took it for 7 to 10 days with not much improvement today today he went back to the PCP office and was prescribed doxycycline but patient was so weak at home that family brought him to the hospital for further evaluation. He admits to coughing up gray-colored phlegm. Denies chest pain, no nausea vomiting or diarrhea. Denies abdominal pain. No previous history of seizures or stroke. In the ED chest x-ray showed no infiltrate. Patient requiring 3 L of oxygen in the ED    Review of systems:    In addition to the HPI above,   All other systems reviewed and are negative.    Past History of the following :    Past Medical History:  Diagnosis Date  . AAA (abdominal aortic aneurysm) ([place]) 2004   s/p repair 2004; 4.3 cm infrarenal in 05/2011  . Abnormality of gait 02/23/2013  . Adenocarcinoma of right lung ([place]) 02/24/2011   Ct A/P 2012:  2cm lung mass RLL PET 6295:  Hypermetabolic RLL mass, no other hypermetabolic areas. TTNA 02/2011:  Adenocarcinoma, markers c/w lung origin Right lower lobe superior segmentectomy. 04/01/2011 Dr. Arlyce Dice   . Adenocarcinoma, lung  ([place]) 01/2011   transthoracic FNA; resection of the superior segment of the RLL in 03/2011; negative nodes; no chemotherapy nor radiation planned  . Arm fracture    right arm  . Arteriosclerotic cardiovascular disease (ASCVD) 1973, 12/2010   S/P NSTEMI secondary to distal RCA/PL lesion, tx medically.  EF of  55%-60% per  echo.  . Benign prostatic hypertrophy    s/p transurethral resection of the prostate  . Bilateral renal masses    Cystic, more prominent on CT in 12/2010 than 2007; followed by Dr. Rosana Hoes  . Bladder cancer [facility]) 1996   Transurethral resection of the bladder + chemotherapy/BCG as premed  . Cataract   . Chronic kidney disease    Creatinine 1.4 on discharge 12/20/2100; proteinuria; normal renal ultrasound in 2010; recent creatinines of 1.7-2.; Bilateral cystic renal masses by CT in 2011  . COPD (chronic obstructive pulmonary disease) ([place])   . Coronary artery disease   . Cough    thick phlegm  . Diabetes mellitus    Type II  . Diplopia 02/23/2013  . Diverticulosis   .  Essential and other specified forms of tremor 02/23/2013  . Hx of Clostridium difficile infection   . Hyperlipidemia   . Hypertension   . Insomnia   . ITP (idiopathic thrombocytopenic purpura) 09/07/2012   Chronic ITP of adults versus medication-induced ITP.  Stable  . Myocardial infarction ([place])   . Nephrolithiasis 2012   ARF in 01/2011 due to obstructing nephrolithiasis  . Obesity   . OSA (obstructive sleep apnea)   . Polyneuropathy in diabetes(357.2) 02/23/2013  . Thrombocytopenia ([place])   . Tobacco abuse    50-pack-year consumption; quit in 12/2010  . Tubular adenoma of colon   . Ventral hernia       Past Surgical History:  Procedure Laterality Date  . ABDOMINAL AORTIC ANEURYSM REPAIR  2004  . CARDIAC CATHETERIZATION    . COLONOSCOPY  03/19/2010   Dr. Gala Romney -(poor prep) Anal papilla, rectal hyperplastic polyp, tubular adenoma removed splenic flexure, left-sided diverticula  . COLONOSCOPY  11/28/2004    RMR:  Diminutive rectal and left colon polyps as described above, cold  biopsied/removed/  Left sided diverticula. The remainder of the colonic mucosa appeared normal.  . COLONOSCOPY   09/15/01   RMR: Multiple diminutive polyps destroyed with dermolysis as described above/ Multiple small polyps on stalks in the colon resected with snare cautery/ Scattered pan colonic diverticulum/ The remainder of the colonic mucosa appeared normal  . COLONOSCOPY N/A 05/01/2015   Procedure: COLONOSCOPY;  Surgeon: Daneil Dolin, MD;  [facility];  Service: Endoscopy;  Laterality: N/A;  1115  . CYSTECTOMY    . CYSTOSCOPY  04/2014  . CYSTOSTOMY W/ BLADDER BIOPSY    . FLEXIBLE SIGMOIDOSCOPY  2014   Dr. Olevia Perches: tubular adenoma, negative stool studies   . IR EXT NEPHROURETERAL CATH EXCHANGE  07/15/2018  . LUNG LOBECTOMY    . TONSILLECTOMY    . TRANSURETHRAL RESECTION OF PROSTATE    . VIDEO BRONCHOSCOPY WITH ENDOBRONCHIAL NAVIGATION N/A 10/10/2013   Procedure: VIDEO BRONCHOSCOPY WITH ENDOBRONCHIAL NAVIGATION;  Surgeon: Melrose Nakayama, MD;  Location: [facility];  Service: Thoracic;  Laterality: N/A;  NO BLOOD THINNERS BUT PATIENT HAS ITP  . WEDGE RESECTION  04/2011   carcinoma of lung      Social History:      Social History   Tobacco Use  . Smoking status: Former Smoker    Packs/day: 1.00    Years: 50.00    Pack years: 50.00    Types: Cigarettes    Start date: 07/22/1955  . Smokeless tobacco: Never Used  . Tobacco comment: "smokes a few days a week" 09/29/17  Substance Use Topics  . Alcohol use: No    Alcohol/week: 0.0 standard drinks       Family History :     Family History  Problem Relation Age of Onset  . Aortic aneurysm Mother   . Emphysema Father        smoker  . Clotting disorder Father   . Arthritis Father   . Hypertension Father   . Diabetes Father   . Aortic aneurysm Father   . Tremor Father   . Stroke Paternal Grandmother   . Other Paternal Grandfather         brain aneurysm  . Colon cancer Cousin       Home Medications:   Prior to Admission medications   Medication Sig Start Date End Date Taking? Authorizing Provider  Acetylcysteine (NAC PO) Take 1 capsule by mouth 2 (two) times daily.   Yes  [provider]  albuterol (PROAIR HFA) 108 (90 Base) MCG/ACT inhaler INHALE 2 PUFFS INTO THE LUNGS 4 TIMES DAILY AS NEEDED. Patient taking differently: Inhale 2 puffs into the lungs every 4 (four) hours as needed for shortness of breath. INHALE 2 PUFFS INTO THE LUNGS 4 TIMES DAILY AS NEEDED. 07/28/18  Yes McQuaid, Ronie Spies, MD  ALPRAZolam (XANAX) 0.5 MG tablet TAKE ONE TABLET TWICE DAILY AS NEEDED FOR ANXIETY. Patient taking differently: Take 0.5 mg by mouth at bedtime. *May take an additional tablet daily as needed for anxiety/sleep 07/23/18  Yes Mikey Kirschner, MD  aspirin EC 81 MG tablet Take 81 mg by mouth every morning.    Yes [provider]  cholecalciferol (VITAMIN D) 1000 units tablet Take 2,000 Units by mouth every morning.    Yes [provider]  citalopram (CELEXA) 20 MG tablet TAKE ONE (1) TABLET BY MOUTH EVERY DAY Patient taking differently: Take 20 mg by mouth every morning.  08/11/18  Yes Mikey Kirschner, MD  clopidogrel (PLAVIX) 75 MG tablet Take 75 mg by mouth every morning.   Yes [provider]  Cyanocobalamin (B-12) 2500 MCG TABS Take 1 tablet by mouth every morning.   Yes [provider]  doxycycline (VIBRA-TABS) 100 MG tablet Take 1 tablet (100 mg total) by mouth 2 (two) times daily. 08/27/18  Yes Mikey Kirschner, MD  fluticasone (FLONASE) 50 MCG/ACT nasal spray Place 1 spray into both nostrils daily. Patient taking differently: Place 1 spray into both nostrils every morning.  11/09/17  Yes Mikey Kirschner, MD  metoprolol tartrate (LOPRESSOR) 50 MG tablet TAKE ONE-HALF TABLET BY MOUTH TWICE DAILY Patient taking differently: Take 25 mg by mouth 2 (two) times daily.  08/11/18  Yes Mikey Kirschner, MD  Misc Natural Products (OSTEO BI-FLEX JOINT SHIELD) TABS Take 1 tablet by mouth 2 (two) times daily.   Yes [provider]  mometasone (ELOCON) 0.1 % cream Apply 1 application topically daily. Affected area Patient taking differently: Apply 1 application topically daily as needed (for irritation). Affected area 02/03/18  Yes Mikey Kirschner, MD  niacin (NIASPAN) 500 MG CR tablet Take 2 tablets (1,000 mg total) by mouth at bedtime. Patient taking differently: Take 1,000 mg by mouth every evening.  10/20/16  Yes Mikey Kirschner, MD  nitroGLYCERIN (NITROSTAT) 0.4 MG SL tablet Place 1 tablet (0.4 mg total) under the tongue every 5 (five) minutes as needed. Call MD if need more than 2 Patient taking differently: Place 0.4 mg under the tongue every 5 (five) minutes as needed for chest pain. Call MD if need more than 2 04/18/16  Yes Mikey Kirschner, MD  OLANZapine (ZYPREXA) 5 MG tablet Take 5 mg by mouth every evening.    Yes [provider]  OXYGEN Inhale 2.5 L into the lungs at bedtime. With CPAP   Yes [provider]  pravastatin (PRAVACHOL) 80 MG tablet TAKE ONE (1) TABLET EACH DAY Patient taking differently: Take 80 mg by mouth every evening.  08/11/18  Yes Mikey Kirschner, MD  Probiotic Product (PROBIOTIC PEARLS ADVANTAGE) CAPS Take 1 capsule by mouth daily.   Yes [provider]  sodium bicarbonate 650 MG tablet Take 1,300 mg by mouth 2 (two) times daily.   Yes [provider]  SSD 1 % cream Apply 1 application topically daily.  06/09/18  Yes [provider]  tiotropium (SPIRIVA HANDIHALER) 18 MCG inhalation capsule PLACE ONE CAPSULE INTO INHALER AND INHALE  DAILY Patient taking differently: Place 18 mcg into inhaler and inhale every morning.  07/23/18  Yes Kathyrn Drown, MD  triamcinolone cream (KENALOG) 0.1 % Apply 1 application topically 2 (two) times daily.    Yes [provider]  amoxicillin (AMOXIL) 500 MG capsule  Take 1 capsule (500 mg total) by mouth 3 (three) times daily. Patient not taking: Reported on 08/27/2018 08/11/18   Mikey Kirschner, MD  ipratropium-albuterol (DUONEB) 0.5-2.5 (3) MG/3ML SOLN Take 3 mLs by nebulization every 4 (four) hours as needed. Patient not taking: Reported on 08/27/2018 06/20/18   Kinnie Feil, PA-C  budesonide-formoterol [facility]) 160-4.5 MCG/ACT inhaler Inhale 2 puffs into the lungs 2 (two) times daily.    10/06/11  [provider]     Allergies:     Allergies  Allergen Reactions  . Codeine Anaphylaxis  . Etodolac Other (See Comments)    dizziness  . Zanaflex [Tizanidine Hcl] Other (See Comments)    Drowsiness     Physical Exam:   Vitals  Blood pressure (!) 142/69, pulse 88, temperature 98.3 F (36.8 C), temperature source Oral, resp. rate (!) 30, height 6' (1.829 m), weight 108 kg, SpO2 94 %.  1.  General: Appears in no acute distress  2. Psychiatric: S1-S2, regular, no murmur auscultated  3. Neurologic: Cranial nerve II through XII grossly intact, no neurological deficit  4. HEENMT:  Atraumatic normocephalic, extraocular muscles intact.  Oral mucosa mildly dry.  5. Respiratory : Bibasilar crackles auscultated, no wheezing or rhonchi  6. Cardiovascular : S1-S2, regular, no murmur auscultated.  7. Gastrointestinal:  Abdomen is soft, nontender, no organomegaly     Data Review:    CBC Recent Labs  Lab 08/27/18 2045  WBC 9.0  HGB 8.7*  HCT 29.7*  PLT 91*  MCV 102.8*  MCH 30.1  MCHC 29.3*  RDW 18.1*  LYMPHSABS 1.1  MONOABS 0.7  EOSABS 0.2  BASOSABS 0.0   ------------------------------------------------------------------------------------------------------------------  Results for orders placed or performed during the hospital encounter of 08/27/18 (from the past 48 hour(s))  Comprehensive metabolic panel     Status: Abnormal   Collection Time: 08/27/18  8:45 PM  Result Value Ref Range   Sodium 142 135 - 145  mmol/L   Potassium 4.2 3.5 - 5.1 mmol/L   Chloride 111 98 - 111 mmol/L   CO2 23 22 - 32 mmol/L   Glucose, Bld 127 (H) 70 - 99 mg/dL   BUN 45 (H) 8 - 23 mg/dL   Creatinine, Ser 3.25 (H) 0.61 - 1.24 mg/dL   Calcium 8.8 (L) 8.9 - 10.3 mg/dL   Total Protein 6.8 6.5 - 8.1 g/dL   Albumin 3.3 (L) 3.5 - 5.0 g/dL   AST 10 (L) 15 - 41 U/L   ALT 8 0 - 44 U/L   Alkaline Phosphatase 51 38 - 126 U/L   Total Bilirubin 1.5 (H) 0.3 - 1.2 mg/dL   GFR calc non Af Amer 17 (L) >60 mL/min   GFR calc Af Amer 19 (L) >60 mL/min   Anion gap 8 5 - 15    Comment: Performed at [facility], [address], [place], [place] [address]  CBC with Differential/Platelet     Status: Abnormal   Collection Time: 08/27/18  8:45 PM  Result Value Ref Range   WBC 9.0 4.0 - 10.5 K/uL   RBC 2.89 (L) 4.22 - 5.81 MIL/uL   Hemoglobin 8.7 (L) 13.0 - 17.0 g/dL   HCT 29.7 (L) 39.0 -  52.0 %   MCV 102.8 (H) 80.0 - 100.0 fL   MCH 30.1 26.0 - 34.0 pg   MCHC 29.3 (L) 30.0 - 36.0 g/dL   RDW 18.1 (H) 11.5 - 15.5 %   Platelets 91 (L) 150 - 400 K/uL    Comment: SPECIMEN CHECKED FOR CLOTS   nRBC 0.0 0.0 - 0.2 %   Neutrophils Relative % 77 %   Neutro Abs 6.9 1.7 - 7.7 K/uL   Lymphocytes Relative 12 %   Lymphs Abs 1.1 0.7 - 4.0 K/uL   Monocytes Relative 8 %   Monocytes Absolute 0.7 0.1 - 1.0 K/uL   Eosinophils Relative 2 %   Eosinophils Absolute 0.2 0.0 - 0.5 K/uL   Basophils Relative 0 %   Basophils Absolute 0.0 0.0 - 0.1 K/uL   Immature Granulocytes 1 %   Abs Immature Granulocytes 0.06 0.00 - 0.07 K/uL    Comment: Performed at [facility], [address], [place], [place] [address]    Chemistries  Recent Labs  Lab 08/27/18 2045  NA 142  K 4.2  CL 111  CO2 23  GLUCOSE 127*  BUN 45*  CREATININE 3.25*  CALCIUM 8.8*  AST 10*  ALT 8  ALKPHOS 51  BILITOT 1.5*    ------------------------------------------------------------------------------------------------------------------  ------------------------------------------------------------------------------------------------------------------ GFR: Estimated Creatinine Clearance: 22.3 mL/min (A) (by C-G formula based on SCr of 3.25 mg/dL (H)). Liver Function Tests: Recent Labs  Lab 08/27/18 2045  AST 10*  ALT 8  ALKPHOS 51  BILITOT 1.5*  PROT 6.8  ALBUMIN 3.3*   No results for input(s): LIPASE, AMYLASE in the last 168 hours. No results for input(s): AMMONIA in the last 168 hours. Coagulation Profile: No results for input(s): INR, PROTIME in the last 168 hours. Cardiac Enzymes: No results for input(s): CKTOTAL, CKMB, CKMBINDEX, TROPONINI in the last 168 hours. BNP (last 3 results) No results for input(s): PROBNP in the last 8760 hours. HbA1C: No results for input(s): HGBA1C in the last 72 hours. CBG: No results for input(s): GLUCAP in the last 168 hours. Lipid Profile: No results for input(s): CHOL, HDL, LDLCALC, TRIG, CHOLHDL, LDLDIRECT in the last 72 hours. Thyroid Function Tests: No results for input(s): TSH, T4TOTAL, FREET4, T3FREE, THYROIDAB in the last 72 hours. Anemia Panel: No results for input(s): VITAMINB12, FOLATE, FERRITIN, TIBC, IRON, RETICCTPCT in the last 72 hours.  -----------------------------------------------------------------------------   Imaging Results:    Dg Chest 2 View  Result Date: 08/27/2018 CLINICAL DATA:  Shortness of breath with exertion. History of bladder and lung cancer. EXAM: CHEST - 2 VIEW COMPARISON:  Chest radiograph June 20, 2018 FINDINGS: Cardiac silhouette is mildly enlarged. Tortuous, but possibly ectatic calcified aorta. Chronic interstitial changes with strandy densities RIGHT lung base. Similar small pleural effusion versus pleural thickening posteriorly. No pneumothorax. Soft tissue planes and included osseous structures are unchanged.  Osteopenia. IMPRESSION: 1. Stable cardiomegaly. 2. Chronic interstitial changes/COPD with RIGHT lung base atelectasis. Small posterior pleural effusion versus pleural thickening. 3.  Aortic Atherosclerosis (ICD10-I70.0). Electronically Signed   By: Elon Alas M.D.   On: 08/27/2018 21:17       Assessment & Plan:    Active Problems:   COPD exacerbation ([place])   1. COPD exacerbation-start Solu-Medrol 60 mg IV every 6 hours, Mucinex 600 twice daily, flutter valve, continue doxycycline 100 mg p.o. twice daily, DuoNebs every 6 hours scheduled.  Patient might need oxygen at home, consider checking O2 sats on ambulation at the time of discharge.  2. Coronary disease-stable, no  chest pain.  Continue aspirin, Plavix, metoprolol, Pravachol  3. Hypertension-continue metoprolol  4. Obstructive sleep apnea-continue CPAP at bedtime  5. CKD stage IV-creatinine 3.25, at baseline.  6. Diabetes mellitus-generally diet controlled, he is not on medications at home.  Last hemoglobin A1c in January 2020 was 5.0.  Patient started on Solu-Medrol, will start sensitive sliding scale insulin   DVT Prophylaxis-   Lovenox   AM Labs Ordered, also please review Full Orders  Family Communication: Admission, patients condition and plan of care including tests being ordered have been discussed with the patient and his wife and son who indicate understanding and agree with the plan and Code Status.  Code Status: DNR  Admission status: Observation/Inpatient: Based on patients clinical presentation and evaluation of above clinical data, I have made determination that patient meets Inpatient criteria at this time.  Patient has COPD exacerbation, started on IV steroids, duo nebs.  Time spent in minutes : 60 minutes   Oswald Hillock M.D on 08/28/2018 at 12:22 AM

## 2018-08-28 NOTE — Progress Notes (Signed)
Patient seen and examined.  Admitted after midnight secondary to increased shortness of breath and hypoxia.  Found with COPD exacerbation and need of oxygen supplementation.  Vital signs stable; denies chest pain, good improvement to his steroids and nebulizer management.  Still feeling weak and having mild difficulty speaking in full sentences; but expressed feeling that the treatment is working.  Please refer to H&P written by Dr. Darrick Meigs for further info/details on admission.  Plan: -Continue IV steroids, antibiotics, nebulizer treatment, flutte rvalve and start pulmicort. -continue weaning oxygen supplementation as tolerated -follow clinical response.  Barton Dubois MD [phone]

## 2018-08-28 NOTE — Progress Notes (Signed)
Will hold CPAP to night at patients request

## 2018-08-28 NOTE — ED Notes (Signed)
Patient's family left for the night. Patient repositioned and vitals taken. Set monitor to take vitals every hour.

## 2018-08-28 NOTE — ED Notes (Signed)
Hourly rounding done. Patient's brief changed. Patient cleaned and given warm blankets.

## 2018-08-29 DIAGNOSIS — E1121 Type 2 diabetes mellitus with diabetic nephropathy: Secondary | ICD-10-CM

## 2018-08-29 DIAGNOSIS — G4733 Obstructive sleep apnea (adult) (pediatric): Secondary | ICD-10-CM

## 2018-08-29 LAB — GLUCOSE, CAPILLARY
GLUCOSE-CAPILLARY: 301 mg/dL — AB (ref 70–99)
Glucose-Capillary: 183 mg/dL — ABNORMAL HIGH (ref 70–99)
Glucose-Capillary: 245 mg/dL — ABNORMAL HIGH (ref 70–99)
Glucose-Capillary: 244 mg/dL — ABNORMAL HIGH (ref 70–99)

## 2018-08-29 MED ORDER — METHYLPREDNISOLONE SODIUM SUCC 125 MG IJ SOLR
60.0000 mg | Freq: Three times a day (TID) | INTRAMUSCULAR | Status: DC
  Administered 2018-08-29 – 2018-08-30 (×4): 60 mg via INTRAVENOUS
  Filled 2018-08-29 (×4): qty 2

## 2018-08-29 NOTE — Progress Notes (Signed)
Patient rinsed mouth after use of steroid with mouth wash. Afterwards he took a swallow of water which he became strangled on which he cleared with cough. Wife reports he does this often. He has had swallow eval in past may need repeat.

## 2018-08-29 NOTE — Progress Notes (Signed)
PROGRESS NOTE    Kyle [name]  RKY:[id number] DOB: 1934-06-20 DOA: 08/27/2018 PCP: Mikey Kirschner, MD   Brief Narrative:  85 y.o. male,With history of adenocarcinoma of right lung status post right lower lobe superior segmentectomy in 2012, bladder cancer status post transurethral resection of the bladder, right nephrostomy tube in place, coronary artery disease, COPD, obstructive sleep apnea on CPAP at bedtime and hypertension; who came to hospital with worsening shortness of breath.  Found with respiratory failure with hypoxia due to COPD exacerbation and bronchiectasis.  Assessment & Plan: 1-acute respiratory failure with hypoxia: Due to COPD exacerbation and bronchiectasis. -Slowly improving, even is still having shortness of breath with activity and requiring [place] oxygen supplementation. -Continue steroids, continue antibiotics, continue nebulizer management and the use of flutter valve -Will continue to wean oxygen as tolerated and follow clinical response. -Physical therapy evaluation requested.  2-Coronary artery disease -Currently not complaining of chest pain -Will continue the use of aspirin, Plavix, metoprolol and statins.  3-obstructive sleep apnea -Continue CPAP nightly.  4-essential hypertension -Is stable and well-controlled -Continue the use of metoprolol.  5-type 2 diabetes mellitus -Diet control -Last A1c in January 2020 was 5.0 -Continue sliding scale insulin while receiving steroids.  6-chronic kidney disease is stage IV -Stable and at baseline. -Continue to monitor renal function trend intermittently.   DVT prophylaxis: Lovenox Code Status: DNR Family Communication: Wife at bedside. Disposition Plan: Continue steroids, nebulizer treatments, flutter valve and continue attempting to wean oxygen supplementation as tolerated.  Physical therapy has been requested.  Consultants:   None  Procedures:   See below for x-ray reports.  Antimicrobials:    Anti-infectives (From admission, onward)   Start     Dose/Rate Route Frequency Ordered Stop   08/28/18 0315  doxycycline (VIBRA-TABS) tablet 100 mg     100 mg Oral 2 times daily 08/28/18 0301        Subjective: Denies chest pain, no nausea, no vomiting.  Slowly improving.  Still short of breath and using oxygen supplementation during the day.  At baseline patient only uses 2 L nasal cannula along with his CPAP.  Objective: Vitals:   08/28/18 2317 08/29/18 0107 08/29/18 0652 08/29/18 0949  BP: (!) 123/54  128/81   Pulse: 74 74 79   Resp:  16 19   Temp:      TempSrc:      SpO2:  95% 94% 95%  Weight:      Height:        Intake/Output Summary (Last 24 hours) at 08/29/2018 1405 Last data filed at 08/29/2018 1047 Gross per 24 hour  Intake 480 ml  Output 1000 ml  Net -520 ml   Filed Weights   08/27/18 1950  Weight: 108 kg    Examination: General exam: Alert, awake, oriented x 3, denying chest pain, no nausea, no vomiting.  Patient reports shortness of breath with exertion, even expressed feeling better.  Still using oxygen supplementation 2 L. Respiratory system: Positive rhonchi, positive expiratory wheezing; no using accessory muscles.  Normal respiratory effort.   Cardiovascular system: Rate controlled, no rubs, no gallops, positive systolic murmur.  Gastrointestinal system: Abdomen is nondistended, soft and nontender. No organomegaly or masses felt. Normal bowel sounds heard. Central nervous system: Alert and oriented. No focal neurological deficits. Extremities: Trace edema bilaterally, no clubbing, no cyanosis. Skin: No rashes, no petechiae. Psychiatry: Judgement and insight appear normal. Mood & affect appropriate.     Data Reviewed: I have personally reviewed following labs  and imaging studies  CBC: Recent Labs  Lab 08/27/18 2045 08/28/18 0335  WBC 9.0 9.3  NEUTROABS 6.9  --   HGB 8.7* 9.3*  HCT 29.7* 31.4*  MCV 102.8* 102.6*  PLT 91* 84*   Basic Metabolic  Panel: Recent Labs  Lab 08/27/18 2045 08/28/18 0335  NA 142 143  K 4.2 4.1  CL 111 112*  CO2 23 22  GLUCOSE 127* 146*  BUN 45* 42*  CREATININE 3.25* 2.97*  CALCIUM 8.8* 9.0   GFR: Estimated Creatinine Clearance: 24.4 mL/min (A) (by C-G formula based on SCr of 2.97 mg/dL (H)).   Liver Function Tests: Recent Labs  Lab 08/27/18 2045 08/28/18 0335  AST 10* 9*  ALT 8 9  ALKPHOS 51 53  BILITOT 1.5* 1.7*  PROT 6.8 6.9  ALBUMIN 3.3* 3.4*   HbA1C: Recent Labs    08/28/18 0335  HGBA1C 5.2   CBG: Recent Labs  Lab 08/28/18 1205 08/28/18 1656 08/28/18 2148 08/29/18 0804 08/29/18 1119  GLUCAP 278* 135* 170* 183* 301*   Urine analysis:    Component Value Date/Time   COLORURINE YELLOW 12/27/2017 0435   APPEARANCEUR CLOUDY (A) 12/27/2017 0435   LABSPEC 1.014 12/27/2017 0435   PHURINE 6.0 12/27/2017 0435   GLUCOSEU 50 (A) 12/27/2017 0435   HGBUR LARGE (A) 12/27/2017 0435   BILIRUBINUR NEGATIVE 12/27/2017 0435   KETONESUR NEGATIVE 12/27/2017 0435   PROTEINUR Positive (A) 04/19/2018 1642   PROTEINUR 100 (A) 12/27/2017 0435   UROBILINOGEN 1.0 10/06/2013 1527   NITRITE NEGATIVE 12/27/2017 0435   LEUKOCYTESUR Large (3+) (A) 04/19/2018 1642   Radiology Studies: Dg Chest 2 View  Result Date: 08/27/2018 CLINICAL DATA:  Shortness of breath with exertion. History of bladder and lung cancer. EXAM: CHEST - 2 VIEW COMPARISON:  Chest radiograph June 20, 2018 FINDINGS: Cardiac silhouette is mildly enlarged. Tortuous, but possibly ectatic calcified aorta. Chronic interstitial changes with strandy densities RIGHT lung base. Similar small pleural effusion versus pleural thickening posteriorly. No pneumothorax. Soft tissue planes and included osseous structures are unchanged. Osteopenia. IMPRESSION: 1. Stable cardiomegaly. 2. Chronic interstitial changes/COPD with RIGHT lung base atelectasis. Small posterior pleural effusion versus pleural thickening. 3.  Aortic Atherosclerosis  (ICD10-I70.0). Electronically Signed   By: Elon Alas M.D.   On: 08/27/2018 21:17    Scheduled Meds: . ALPRAZolam  0.5 mg Oral QHS  . arformoterol  15 mcg Nebulization BID  . aspirin EC  81 mg Oral q morning - 10a  . budesonide (PULMICORT) nebulizer solution  0.5 mg Nebulization BID  . citalopram  20 mg Oral q morning - 10a  . clopidogrel  75 mg Oral q morning - 10a  . doxycycline  100 mg Oral BID  . enoxaparin (LOVENOX) injection  30 mg Subcutaneous Q24H  . fluticasone  1 spray Each Nare q morning - 10a  . guaiFENesin  600 mg Oral BID  . insulin aspart  0-9 Units Subcutaneous TID WC  . ipratropium-albuterol  3 mL Nebulization Q6H  . methylPREDNISolone (SOLU-MEDROL) injection  60 mg Intravenous Q8H  . metoprolol tartrate  25 mg Oral BID  . niacin  1,000 mg Oral QPM  . OLANZapine  5 mg Oral QPM  . pravastatin  80 mg Oral QPM  . sodium bicarbonate  1,300 mg Oral BID  . sodium chloride flush  3 mL Intravenous Q12H   Continuous Infusions: . sodium chloride       LOS: 1 day    Time spent: 30 minutes.  Barton Dubois, MD [facility] Pager [phone]   08/29/2018, 2:05 PM

## 2018-08-30 LAB — CBC
HCT: 29.2 % — ABNORMAL LOW (ref 39.0–52.0)
Hemoglobin: 8.6 g/dL — ABNORMAL LOW (ref 13.0–17.0)
MCH: 30.8 pg (ref 26.0–34.0)
MCHC: 29.5 g/dL — AB (ref 30.0–36.0)
MCV: 104.7 fL — ABNORMAL HIGH (ref 80.0–100.0)
Platelets: 101 10*3/uL — ABNORMAL LOW (ref 150–400)
RBC: 2.79 MIL/uL — AB (ref 4.22–5.81)
RDW: 17.6 % — ABNORMAL HIGH (ref 11.5–15.5)
WBC: 10.6 10*3/uL — ABNORMAL HIGH (ref 4.0–10.5)
nRBC: 0 % (ref 0.0–0.2)

## 2018-08-30 LAB — BASIC METABOLIC PANEL
Anion gap: 10 (ref 5–15)
BUN: 60 mg/dL — ABNORMAL HIGH (ref 8–23)
CHLORIDE: 112 mmol/L — AB (ref 98–111)
CO2: 23 mmol/L (ref 22–32)
Calcium: 9.3 mg/dL (ref 8.9–10.3)
Creatinine, Ser: 3.22 mg/dL — ABNORMAL HIGH (ref 0.61–1.24)
GFR calc Af Amer: 20 mL/min — ABNORMAL LOW (ref >60)
GFR calc non Af Amer: 17 mL/min — ABNORMAL LOW (ref >60)
GLUCOSE: 185 mg/dL — AB (ref 70–99)
Potassium: 4.5 mmol/L (ref 3.5–5.1)
Sodium: 145 mmol/L (ref 135–145)

## 2018-08-30 LAB — GLUCOSE, CAPILLARY
Glucose-Capillary: 182 mg/dL — ABNORMAL HIGH (ref 70–99)
Glucose-Capillary: 220 mg/dL — ABNORMAL HIGH (ref 70–99)
Glucose-Capillary: 265 mg/dL — ABNORMAL HIGH (ref 70–99)
Glucose-Capillary: 244 mg/dL — ABNORMAL HIGH (ref 70–99)

## 2018-08-30 MED ORDER — METHYLPREDNISOLONE SODIUM SUCC 125 MG IJ SOLR
60.0000 mg | Freq: Two times a day (BID) | INTRAMUSCULAR | Status: DC
  Administered 2018-08-31: 60 mg via INTRAVENOUS
  Filled 2018-08-30: qty 2

## 2018-08-30 NOTE — Progress Notes (Signed)
Patient appeared in no respiratory distress. BBS diminished and clear, SpO2 97% on 3L [place]. Use of his CPAP was offered he declined at this time. Next available breathing treatment available 1300.

## 2018-08-30 NOTE — Plan of Care (Signed)
  Problem: Acute Rehab PT Goals(only PT should resolve) Goal: Pt Will Go Supine/Side To Sit Outcome: Progressing Flowsheets (Taken 08/30/2018 1227) Pt will go Supine/Side to Sit: with min guard assist Goal: Patient Will Transfer Sit To/From Stand Outcome: Progressing Flowsheets (Taken 08/30/2018 1227) Patient will transfer sit to/from stand: with min guard assist Goal: Pt Will Transfer Bed To Chair/Chair To Bed Outcome: Progressing Flowsheets (Taken 08/30/2018 1227) Pt will Transfer Bed to Chair/Chair to Bed: min guard assist Goal: Pt Will Ambulate Outcome: Progressing Flowsheets (Taken 08/30/2018 1227) Pt will Ambulate: 75 feet; with rolling walker; with supervision   12:27 PM, 08/30/18 Lonell Grandchild, MPT Physical Therapist with [facility] [phone] office [phone] mobile phone

## 2018-08-30 NOTE — Clinical Social Work Note (Signed)
Clinical Social Work Assessment  Patient Details  Name: Kyle [name] MRN: [id number] Date of Birth: 08-07-1933  Date of referral:  08/30/18               Reason for consult:  Facility Placement                Permission sought to share information with:    Permission granted to share information::     Name::        Agency::     Relationship::     Contact Information:  son, [name], at bedside  Housing/Transportation Living arrangements for the past 2 months:  Single Family Home Source of Information:  Patient, Adult Children Patient Interpreter Needed:  None Criminal Activity/Legal Involvement Pertinent to Current Situation/Hospitalization:  No - Comment as needed Significant Relationships:  Adult Children, Spouse Lives with:  Spouse Do you feel safe going back to the place where you live?  Yes Need for family participation in patient care:  Yes (Comment)  Care giving concerns:  None identified.    Social Worker assessment / plan:  Patient ambulates with a walker in the home and a cane when he is out.  He "drives some." Patient has a shower bench and a bedside commode that he doesn't use often.  He requires some assistance with ADLs.  Patient is agreeable to short term SNF at discharge.   Employment status:  Retired Nurse, adult PT Recommendations:  [place] / Referral to community resources:  [place]  Patient/Family's Response to care:  Patient is agreeable to short term rehab at [facility].   Patient/Family's Understanding of and Emotional Response to Diagnosis, Current Treatment, and Prognosis:  Patient and son understands his diagnosis, treatment and prognosis and feels short term rehab is most appropriate.   Emotional Assessment Appearance:  Appears stated age Attitude/Demeanor/Rapport:    Affect (typically observed):  Accepting, Calm Orientation:  Oriented to Self, Oriented to Place, Oriented to  Time,  Oriented to Situation Alcohol / Substance use:  Not Applicable Psych involvement (Current and /or in the community):     Discharge Needs  Concerns to be addressed:  Discharge Planning Concerns Readmission within the last 30 days:  No Current discharge risk:  None Barriers to Discharge:  No Barriers Identified   Ihor Gully, LCSW 08/30/2018, 3:32 PM

## 2018-08-30 NOTE — Clinical Social Work Placement (Signed)
   CLINICAL SOCIAL WORK PLACEMENT  NOTE  Date:  08/30/2018  Patient Details  Name: Kyle [name] MRN: [id number] Date of Birth: 30-Apr-1934  Clinical Social Work is seeking post-discharge placement for this patient at the [place] level of care (*CSW will initial, date and re-position this form in  chart as items are completed):  Yes   Patient/family provided with [place] Work Department's list of facilities offering this level of care within the geographic area requested by the patient (or if unable, by the patient's family).  Yes   Patient/family informed of their freedom to choose among providers that offer the needed level of care, that participate in Medicare, Medicaid or managed care program needed by the patient, have an available bed and are willing to accept the patient.  Yes   Patient/family informed of [place]'s ownership interest in [facility] and [facility], as well as of the fact that they are under no obligation to receive care at these facilities.  PASRR submitted to EDS on       PASRR number received on       Existing PASRR number confirmed on 08/30/18     FL2 transmitted to all facilities in geographic area requested by pt/family on 08/30/18     FL2 transmitted to all facilities within larger geographic area on       Patient informed that his/her managed care company has contracts with or will negotiate with certain facilities, including the following:            Patient/family informed of bed offers received.  Patient chooses bed at       Physician recommends and patient chooses bed at      Patient to be transferred to   on  .  Patient to be transferred to facility by       Patient family notified on   of transfer.  Name of family member notified:        PHYSICIAN       Additional Comment:    _______________________________________________ Ihor Gully, LCSW 08/30/2018, 3:35 PM

## 2018-08-30 NOTE — Progress Notes (Signed)
Inpatient Diabetes Program Recommendations  AACE/ADA: New Consensus Statement on Inpatient Glycemic Control   Target Ranges:  Prepandial:   less than 140 mg/dL      Peak postprandial:   less than 180 mg/dL (1-2 hours)      Critically ill patients:  140 - 180 mg/dL   Results for Kyle [name], Kyle [name] (MRN [id number]) as of 08/30/2018 07:36  Ref. Range 08/29/2018 08:04 08/29/2018 11:19 08/29/2018 16:21 08/29/2018 21:09 08/30/2018 07:34  Glucose-Capillary Latest Ref Range: 70 - 99 mg/dL 183 (H) 301 (H) 245 (H) 244 (H) 182 (H)  Results for Kyle [name], Kyle [name] (MRN [id number]) as of 08/30/2018 07:36  Ref. Range 08/11/2018 14:43 08/28/2018 03:35  Hemoglobin A1C Latest Ref Range: 4.8 - 5.6 % 5.0 5.2   Review of Glycemic Control  Diabetes history: DM2 (diet controlled) Outpatient Diabetes medications: None Current orders for Inpatient glycemic control: Novolog 0-9 units TID with meals; Solumedrol 60 mg Q8H  Inpatient Diabetes Program Recommendations:   Correction (SSI): Please consider adding Novolog 0-5 units QHS for bedtime correction. Insulin - Meal Coverage: If steroids are continued, please consider ordering Novolog 4 units TID with meals for meal coverage if patient eats at least 50% of meals.  Thanks, Barnie Alderman, RN, MSN, CDE Diabetes Coordinator Inpatient Diabetes Program [phone] (Team Pager from 8am to 5pm)

## 2018-08-30 NOTE — Clinical Social Work Placement (Signed)
   CLINICAL SOCIAL WORK PLACEMENT  NOTE  Date:  08/30/2018  Patient Details  Name: Kyle [name] MRN: [id number] Date of Birth: [birth year]  Clinical Social Work is seeking post-discharge placement for this patient at the [place] level of care (*CSW will initial, date and re-position this form in  chart as items are completed):  Yes   Patient/family provided with [place] Work Department's list of facilities offering this level of care within the geographic area requested by the patient (or if unable, by the patient's family).  Yes   Patient/family informed of their freedom to choose among providers that offer the needed level of care, that participate in Medicare, Medicaid or managed care program needed by the patient, have an available bed and are willing to accept the patient.  Yes   Patient/family informed of [facility]'s ownership interest in [facility] and [facility], as well as of the fact that they are under no obligation to receive care at these facilities.  PASRR submitted to EDS on       PASRR number received on       Existing PASRR number confirmed on 08/30/18     FL2 transmitted to all facilities in geographic area requested by pt/family on 08/30/18     FL2 transmitted to all facilities within larger geographic area on       Patient informed that his/her managed care company has contracts with or will negotiate with certain facilities, including the following:        Yes   Patient/family informed of bed offers received.  Patient [facility] bed at [facility]     Physician recommends and patient chooses bed at      Patient to be transferred to [facility] on  .  Patient to be transferred to facility by       Patient family notified on   of transfer.  Name of family member notified:        PHYSICIAN       Additional Comment:    _______________________________________________ Ihor Gully,  LCSW 08/30/2018, 3:39 PM

## 2018-08-30 NOTE — Evaluation (Addendum)
Physical Therapy Evaluation Patient Details Name: Kyle [name] MRN: [id number] DOB: Sep 15, 1933 Today's Date: 08/30/2018   History of Present Illness  Kyle [name]  is a 85 y.o. male,With history of adenocarcinoma of right lung status post right lower lobe superior segmentectomy in 2012, bladder cancer status post transurethral resection of the bladder, right nephrostomy tube in place, coronary artery disease, COPD, obstructive sleep apnea on CPAP at bedtime, hypertension came to hospital with worsening shortness of breath.  Patient says that he was seen by his PCP 10 days ago and was prescribed amoxicillin he took it for 7 to 10 days with not much improvement today today he went back to the PCP office and was prescribed doxycycline but patient was so weak at home that family brought him to the hospital for further evaluation.    Clinical Impression  Patient limited for functional mobility as stated below secondary to BLE weakness, fatigue, fair/poor standing balance and mild SOB.  Patient tolerated sitting up in chair with his son at bedside after therapy.  Patient will benefit from continued physical therapy in hospital and recommended venue below to increase strength, balance, endurance for safe ADLs and gait.    Follow Up Recommendations SNF    Equipment Recommendations       Recommendations for Other Services       Precautions / Restrictions Precautions Precautions: Fall Restrictions Weight Bearing Restrictions: No      Mobility  Bed Mobility Overal bed mobility: Needs Assistance Bed Mobility: Supine to Sit     Supine to sit: Min assist;Mod assist     General bed mobility comments: slow labored movement with difficulty moving legs off bed  Transfers Overall transfer level: Needs assistance Equipment used: Rolling walker (2 wheeled) Transfers: Sit to/from [facility] Sit to Stand: Min assist Stand pivot transfers: Min assist       General transfer  comment: labored movement  Ambulation/Gait Ambulation/Gait assistance: Min guard;Min assist Gait Distance (Feet): 40 Feet Assistive device: Rolling walker (2 wheeled) Gait Pattern/deviations: Decreased step length - right;Decreased step length - left;Decreased stride length Gait velocity: decreased   General Gait Details: slow slightly labored cadence without loss of balance, limited mostly due to SOB with O2 saturation below 90% after walking  Stairs            Wheelchair Mobility    Modified Rankin (Stroke Patients Only)       Balance Overall balance assessment: Needs assistance Sitting-balance support: Feet supported;No upper extremity supported Sitting balance-Leahy Scale: Good     Standing balance support: Bilateral upper extremity supported;During functional activity Standing balance-Leahy Scale: Fair Standing balance comment: using RW                             Pertinent Vitals/Pain Pain Assessment: No/denies pain    Home Living Family/patient expects to be discharged to:: Private residence Living Arrangements: Spouse/significant other Available Help at Discharge: Family;Available 24 hours/day Type of Home: House Home Access: Stairs to enter Entrance Stairs-Rails: Right;Left(can't reach both) Entrance Stairs-Number of Steps: 3 Home Layout: One level Home Equipment: Cane - single point;Walker - 2 wheels;Bedside commode;Shower seat - built in      Prior Function Level of Independence: Independent with assistive device(s)         Comments: household ambulator using RW, uses SPC for outside     Hand Dominance   Dominant Hand: Right    Extremity/Trunk Assessment   Upper  Extremity Assessment Upper Extremity Assessment: Generalized weakness    Lower Extremity Assessment Lower Extremity Assessment: Generalized weakness    Cervical / Trunk Assessment Cervical / Trunk Assessment: Normal  Communication   Communication: No  difficulties  Cognition Arousal/Alertness: Awake/alert Behavior During Therapy: WFL for tasks assessed/performed Overall Cognitive Status: Within Functional Limits for tasks assessed                                        General Comments      Exercises     Assessment/Plan    PT Assessment Patient needs continued PT services  PT Problem List Decreased strength;Decreased activity tolerance;Decreased balance;Decreased mobility       PT Treatment Interventions Gait training;Stair training;Functional mobility training;Therapeutic activities;Patient/family education;Therapeutic exercise    PT Goals (Current goals can be found in the Care Plan section)  Acute Rehab PT Goals Patient Stated Goal: return home after rehab PT Goal Formulation: With patient/family Time For Goal Achievement: 09/13/18 Potential to Achieve Goals: Good    Frequency Min 3X/week   Barriers to discharge        Co-evaluation               AM-PAC PT "6 Clicks" Mobility  Outcome Measure Help needed turning from your back to your side while in a flat bed without using bedrails?: A Little Help needed moving from lying on your back to sitting on the side of a flat bed without using bedrails?: A Lot Help needed moving to and from a bed to a chair (including a wheelchair)?: A Little Help needed standing up from a chair using your arms (e.g., wheelchair or bedside chair)?: A Little Help needed to walk in hospital room?: A Little Help needed climbing 3-5 steps with a railing? : A Lot 6 Click Score: 16    End of Session Equipment Utilized During Treatment: Gait belt Activity Tolerance: Patient tolerated treatment well;Patient limited by fatigue Patient left: in chair;with call bell/phone within reach;with family/visitor present Nurse Communication: Mobility status PT Visit Diagnosis: Unsteadiness on feet (R26.81);Other abnormalities of gait and mobility (R26.89);Muscle weakness  (generalized) (M62.81)    Time: 1031-5945 PT Time Calculation (min) (ACUTE ONLY): 27 min   Charges:   PT Evaluation $PT Eval Moderate Complexity: 1 Mod PT Treatments $Gait Training: 8-22 mins        12:25 PM, 08/30/18 Lonell Grandchild, MPT Physical Therapist with [facility] [phone] office [phone] mobile phone

## 2018-08-30 NOTE — Plan of Care (Signed)

## 2018-08-30 NOTE — NC FL2 (Signed)
[place] LEVEL OF CARE SCREENING TOOL     IDENTIFICATION  Patient Name: Kyle [name] Birthdate: 16-Nov-1933 Sex: male Admission Date (Current Location): 08/27/2018  [facility] [place] and [place] Number:  [employer] and Address:  [place] [address] [place]      Provider Number: [id number]  Attending Physician Name and Address:  Barton Dubois, MD  Relative Name and Phone Number:       Current Level of Care: Hospital Recommended Level of Care: [place] Prior Approval Number:    Date Approved/Denied:   PASRR Number: [id number]  Discharge Plan: SNF    Current Diagnoses: Patient Active Problem List   Diagnosis Date Noted  . Stage 4 chronic kidney disease ([place]) 06/30/2016  . Subdural hematoma, post-traumatic ([place]) 03/21/2016  . Subdural hematoma ([place]) 03/21/2016  . Malignant neoplasm of trigone of urinary bladder ([place]) 02/12/2016  . Bilirubinemia 12/12/2015  . Thrombocytopenia ([place]) 11/13/2015  . Cough 10/03/2015  . Allergic rhinitis 10/03/2015  . Secondary hyperparathyroidism ([place]) 09/06/2015  . Tremor, essential 08/27/2015  . History of colonic polyps   . Diverticulosis of colon without hemorrhage   . Malignant neoplasm of lateral wall of urinary bladder ([place]) 01/12/2014  . CIS (carcinoma in situ of bladder) 08/22/2013  . Static tremor 02/23/2013  . Diplopia 02/23/2013  . Diabetic polyneuropathy ([place]) 02/23/2013  . Abnormality of gait 02/23/2013  . COPD exacerbation ([place]) 01/31/2013  . Fracture of right distal radius 12/14/2012  . Diabetes mellitus ([place]) 11/30/2012  . Idiopathic thrombocytopenic purpura ([place]) 09/07/2012  . Depression 07/28/2012  . Benign prostate hyperplasia 06/10/2012  . LLQ pain 11/13/2011  . Hernia of anterior abdominal wall 10/28/2011  . AAA (abdominal aortic aneurysm) ([place])   . Tobacco abuse   . Diarrhea 10/22/2011  . Tubular adenoma of colon   . Bladder neck obstruction  06/05/2011  . Obstructive sleep apnea on CPAP 03/27/2011  . Fasting hyperglycemia 03/27/2011  . Recurrent nephrolithiasis 03/27/2011  . Carcinoma of bladder ([place]) 03/27/2011  . COPD (chronic obstructive pulmonary disease) ([place]) 02/24/2011  . Adenocarcinoma of right lung ([place]) 02/24/2011  . CAD (coronary artery disease) 01/06/2011  . Hypertension 01/06/2011  . Chronic kidney disease, stage III (moderate) (HCC) 01/06/2011    Orientation RESPIRATION BLADDER Height & Weight     Self, Time, Situation, Place  O2(2.5) Incontinent(Patient has right nephrostomy tube) Weight: 238 lb (108 kg) Height:  6' (182.9 cm)  BEHAVIORAL SYMPTOMS/MOOD NEUROLOGICAL BOWEL NUTRITION STATUS      Continent Diet(regular)  AMBULATORY STATUS COMMUNICATION OF NEEDS Skin   Limited Assist Verbally Normal                       Personal Care Assistance Level of Assistance  Bathing, Feeding, Dressing Bathing Assistance: Limited assistance Feeding assistance: Independent Dressing Assistance: Limited assistance     Functional Limitations Info  Sight, Hearing, Speech Sight Info: Adequate Hearing Info: Adequate Speech Info: Adequate    SPECIAL CARE FACTORS FREQUENCY  PT (By licensed PT)     PT Frequency: 5x/week              Contractures Contractures Info: Not present    Additional Factors Info  Code Status, Allergies, Psychotropic Code Status Info: DNR Allergies Info: Codeine, Etodolac, Zanaflex Psychotropic Info: Xanax, Celexa         Current Medications (08/30/2018):  This is the current hospital active medication list Current Facility-Administered Medications  Medication Dose Route  Frequency Provider Last Rate Last Dose  . 0.9 %  sodium chloride infusion  250 mL Intravenous PRN Oswald Hillock, MD      . acetaminophen (TYLENOL) tablet 650 mg  650 mg Oral Q6H PRN Oswald Hillock, MD       Or  . acetaminophen (TYLENOL) suppository 650 mg  650 mg Rectal Q6H PRN Oswald Hillock, MD      .  ALPRAZolam Duanne Moron) tablet 0.5 mg  0.5 mg Oral QHS Oswald Hillock, MD   0.5 mg at 08/29/18 2112  . arformoterol (BROVANA) nebulizer solution 15 mcg  15 mcg Nebulization BID Barton Dubois, MD   15 mcg at 08/30/18 0745  . aspirin EC tablet 81 mg  81 mg Oral q morning - 10a Oswald Hillock, MD   81 mg at 08/30/18 0845  . budesonide (PULMICORT) nebulizer solution 0.5 mg  0.5 mg Nebulization BID Barton Dubois, MD   0.5 mg at 08/30/18 0745  . citalopram (CELEXA) tablet 20 mg  20 mg Oral q morning - 10a Oswald Hillock, MD   20 mg at 08/30/18 0846  . clopidogrel (PLAVIX) tablet 75 mg  75 mg Oral q morning - 10a Oswald Hillock, MD   75 mg at 08/30/18 0845  . doxycycline (VIBRA-TABS) tablet 100 mg  100 mg Oral BID Oswald Hillock, MD   100 mg at 08/30/18 0845  . enoxaparin (LOVENOX) injection 30 mg  30 mg Subcutaneous Q24H Barton Dubois, MD   30 mg at 08/30/18 0510  . fluticasone (FLONASE) 50 MCG/ACT nasal spray 1 spray  1 spray Each Nare q morning - 10a Oswald Hillock, MD   1 spray at 08/30/18 0846  . guaiFENesin (MUCINEX) 12 hr tablet 600 mg  600 mg Oral BID Oswald Hillock, MD   600 mg at 08/30/18 0844  . insulin aspart (novoLOG) injection 0-9 Units  0-9 Units Subcutaneous TID WC Oswald Hillock, MD   3 Units at 08/30/18 1305  . ipratropium-albuterol (DUONEB) 0.5-2.5 (3) MG/3ML nebulizer solution 3 mL  3 mL Nebulization Q6H Darrick Meigs, Marge Duncans, MD   3 mL at 08/30/18 1322  . methylPREDNISolone sodium succinate (SOLU-MEDROL) 125 mg/2 mL injection 60 mg  60 mg Intravenous Q8H Barton Dubois, MD   60 mg at 08/30/18 0510  . metoprolol tartrate (LOPRESSOR) tablet 25 mg  25 mg Oral BID Oswald Hillock, MD   25 mg at 08/30/18 0845  . niacin (SLO-NIACIN) CR tablet 1,000 mg  1,000 mg Oral QPM Darrick Meigs, Marge Duncans, MD      . OLANZapine (ZYPREXA) tablet 5 mg  5 mg Oral QPM Oswald Hillock, MD   5 mg at 08/29/18 1628  . ondansetron (ZOFRAN) tablet 4 mg  4 mg Oral Q6H PRN Oswald Hillock, MD       Or  . ondansetron (ZOFRAN) injection 4 mg  4 mg  Intravenous Q6H PRN Oswald Hillock, MD      . pravastatin (PRAVACHOL) tablet 80 mg  80 mg Oral QPM Oswald Hillock, MD   80 mg at 08/29/18 1627  . sodium bicarbonate tablet 1,300 mg  1,300 mg Oral BID Oswald Hillock, MD   1,300 mg at 08/30/18 0847  . sodium chloride flush (NS) 0.9 % injection 3 mL  3 mL Intravenous Q12H Darrick Meigs, Marge Duncans, MD   3 mL at 08/30/18 1100  . sodium chloride flush (NS) 0.9 % injection 3 mL  3 mL  Intravenous PRN Oswald Hillock, MD         Discharge Medications: Please see discharge summary for a list of discharge medications.  Relevant Imaging Results:  Relevant Lab Results:   Additional Information SSN: [id number]  Pariss Hommes, Clydene Pugh, LCSW

## 2018-08-30 NOTE — Care Management Important Message (Signed)
Important Message  Patient Details  Name: Kyle [name] MRN: [id number] Date of Birth: May 09, 1934   Medicare Important Message Given:  Yes    Tommy Medal 08/30/2018, 4:41 PM

## 2018-08-30 NOTE — Progress Notes (Signed)
PROGRESS NOTE    Kyle [name]  LPF:[id number] DOB: 04-28-1934 DOA: 08/27/2018 PCP: Mikey Kirschner, MD   Brief Narrative:  85 y.o. male,With history of adenocarcinoma of right lung status post right lower lobe superior segmentectomy in 2012, bladder cancer status post transurethral resection of the bladder, right nephrostomy tube in place, coronary artery disease, COPD, obstructive sleep apnea on CPAP at bedtime and hypertension; who came to hospital with worsening shortness of breath.  Found with respiratory failure with hypoxia due to COPD exacerbation and bronchiectasis.  Assessment & Plan: 1-acute respiratory failure with hypoxia: Due to COPD exacerbation and bronchiectasis. -Slowly improving, even is still having shortness of breath with activity and requiring Man oxygen supplementation. -Assess need for oxygen supplementation; will check O2 sat at rest and exertion on room air. -Continue steroids (starting tapering), continue antibiotics, continue nebulizer management and the use of flutter valve. -Will continue to wean oxygen as tolerated and follow clinical response. -Physical therapy evaluation requested, and a skilled nursing facility recommended. -Social worker on board and assisting with placement.  2-Coronary artery disease -Currently not complaining of chest pain -Will continue the use of aspirin, Plavix, metoprolol and statins. -Stable findings on telemetry; will DC telemetry.  3-obstructive sleep apnea -Continue CPAP nightly.  4-essential hypertension -Is stable and well-controlled -Continue the use of metoprolol.  5-type 2 diabetes mellitus -Diet control -Last A1c in January 2020 was 5.0 -Continue sliding scale insulin while receiving steroids.  6-chronic kidney disease is stage IV -Stable and at baseline. -Continue to monitor renal function trend intermittently.  7-dysphasia -Will ask for a speech therapy evaluation.   DVT prophylaxis: Lovenox Code  Status: DNR Family Communication: Wife at bedside. Disposition Plan: Continue steroids (initiating tapering), continue nebulizer treatments, flutter valve and continue attempting to wean oxygen supplementation as tolerated.  Physical therapy has evaluated patient and recommended skilled nursing facility short-term for rehabilitation.  Consultants:   None  Procedures:   See below for x-ray reports.  Antimicrobials:  Anti-infectives (From admission, onward)   Start     Dose/Rate Route Frequency Ordered Stop   08/28/18 0315  doxycycline (VIBRA-TABS) tablet 100 mg     100 mg Oral 2 times daily 08/28/18 0301        Subjective: No chest pain, no nausea, no vomiting.  Continued to have improvement in his breathing.  Still requiring intermittently oxygen supplementation expressing shortness of breath on exertion.  Objective: Vitals:   08/30/18 0745 08/30/18 1158 08/30/18 1322 08/30/18 1421  BP:    (!) 149/79  Pulse:      Resp:      Temp:    (!) 97.4 F (36.3 C)  TempSrc:    Oral  SpO2: 98% 97% 96%   Weight:      Height:        Intake/Output Summary (Last 24 hours) at 08/30/2018 1821 Last data filed at 08/30/2018 1700 Gross per 24 hour  Intake 960 ml  Output 1700 ml  Net -740 ml   Filed Weights   08/27/18 1950  Weight: 108 kg    Examination: General exam: Alert, awake, oriented x 3; denies chest pain, no nausea, no vomiting.  Reports breathing continued to improve.  Still using 2 L nasal cannula supplementation intermittently and expressing shortness of breath on exertion. Respiratory system: Improved air movement bilaterally, positive rhonchi right, no crackles, mild expiratory wheezing at the end of expiratory phase. Cardiovascular system:Rate controlled, no rubs, no gallops.  Positive systolic ejection murmur appreciated on exam.  Gastrointestinal system: Abdomen is nondistended, soft and nontender. No organomegaly or masses felt. Normal bowel sounds heard. Central  nervous system: Alert and oriented. No focal neurological deficits. Extremities: No cyanosis or clubbing.   Skin: No rashes, lesions or ulcers Psychiatry: Judgement and insight appear normal. Mood & affect appropriate.   Data Reviewed: I have personally reviewed following labs and imaging studies  CBC: Recent Labs  Lab 08/27/18 2045 08/28/18 0335 08/30/18 0625  WBC 9.0 9.3 10.6*  NEUTROABS 6.9  --   --   HGB 8.7* 9.3* 8.6*  HCT 29.7* 31.4* 29.2*  MCV 102.8* 102.6* 104.7*  PLT 91* 84* 350*   Basic Metabolic Panel: Recent Labs  Lab 08/27/18 2045 08/28/18 0335 08/30/18 0625  NA 142 143 145  K 4.2 4.1 4.5  CL 111 112* 112*  CO2 23 22 23   GLUCOSE 127* 146* 185*  BUN 45* 42* 60*  CREATININE 3.25* 2.97* 3.22*  CALCIUM 8.8* 9.0 9.3   GFR: Estimated Creatinine Clearance: 22.5 mL/min (A) (by C-G formula based on SCr of 3.22 mg/dL (H)).   Liver Function Tests: Recent Labs  Lab 08/27/18 2045 08/28/18 0335  AST 10* 9*  ALT 8 9  ALKPHOS 51 53  BILITOT 1.5* 1.7*  PROT 6.8 6.9  ALBUMIN 3.3* 3.4*   HbA1C: Recent Labs    08/28/18 0335  HGBA1C 5.2   CBG: Recent Labs  Lab 08/29/18 1621 08/29/18 2109 08/30/18 0734 08/30/18 1110 08/30/18 1635  GLUCAP 245* 244* 182* 220* 265*   Urine analysis:    Component Value Date/Time   COLORURINE YELLOW 12/27/2017 0435   APPEARANCEUR CLOUDY (A) 12/27/2017 0435   LABSPEC 1.014 12/27/2017 0435   PHURINE 6.0 12/27/2017 0435   GLUCOSEU 50 (A) 12/27/2017 0435   HGBUR LARGE (A) 12/27/2017 0435   BILIRUBINUR NEGATIVE 12/27/2017 0435   KETONESUR NEGATIVE 12/27/2017 0435   PROTEINUR Positive (A) 04/19/2018 1642   PROTEINUR 100 (A) 12/27/2017 0435   UROBILINOGEN 1.0 10/06/2013 1527   NITRITE NEGATIVE 12/27/2017 0435   LEUKOCYTESUR Large (3+) (A) 04/19/2018 1642   Radiology Studies: No results found.  Scheduled Meds: . ALPRAZolam  0.5 mg Oral QHS  . arformoterol  15 mcg Nebulization BID  . aspirin EC  81 mg Oral q morning -  10a  . budesonide (PULMICORT) nebulizer solution  0.5 mg Nebulization BID  . citalopram  20 mg Oral q morning - 10a  . clopidogrel  75 mg Oral q morning - 10a  . doxycycline  100 mg Oral BID  . enoxaparin (LOVENOX) injection  30 mg Subcutaneous Q24H  . fluticasone  1 spray Each Nare q morning - 10a  . guaiFENesin  600 mg Oral BID  . insulin aspart  0-9 Units Subcutaneous TID WC  . ipratropium-albuterol  3 mL Nebulization Q6H  . [START ON 08/31/2018] methylPREDNISolone (SOLU-MEDROL) injection  60 mg Intravenous Q12H  . metoprolol tartrate  25 mg Oral BID  . niacin  1,000 mg Oral QPM  . OLANZapine  5 mg Oral QPM  . pravastatin  80 mg Oral QPM  . sodium bicarbonate  1,300 mg Oral BID  . sodium chloride flush  3 mL Intravenous Q12H   Continuous Infusions: . sodium chloride       LOS: 2 days    Time spent: 30 minutes.    Barton Dubois, MD [facility] Pager [phone]   08/30/2018, 6:21 PM

## 2018-08-31 ENCOUNTER — Inpatient Hospital Stay
Admission: RE | Admit: 2018-08-31 | Discharge: 2018-09-03 | Disposition: A | Discharge status: Other Acute Inpatient Hospital | Payer: Medicare Other | Source: Ambulatory Visit | Attending: Internal Medicine | Admitting: Internal Medicine

## 2018-08-31 DIAGNOSIS — Z7951 Long term (current) use of inhaled steroids: Secondary | ICD-10-CM | POA: Diagnosis not present

## 2018-08-31 DIAGNOSIS — N3001 Acute cystitis with hematuria: Secondary | ICD-10-CM | POA: Diagnosis not present

## 2018-08-31 DIAGNOSIS — G4733 Obstructive sleep apnea (adult) (pediatric): Secondary | ICD-10-CM | POA: Diagnosis not present

## 2018-08-31 DIAGNOSIS — J189 Pneumonia, unspecified organism: Secondary | ICD-10-CM | POA: Diagnosis not present

## 2018-08-31 DIAGNOSIS — H532 Diplopia: Secondary | ICD-10-CM | POA: Diagnosis not present

## 2018-08-31 DIAGNOSIS — J209 Acute bronchitis, unspecified: Secondary | ICD-10-CM | POA: Diagnosis not present

## 2018-08-31 DIAGNOSIS — E1122 Type 2 diabetes mellitus with diabetic chronic kidney disease: Secondary | ICD-10-CM | POA: Diagnosis not present

## 2018-08-31 DIAGNOSIS — R000 Tachycardia, unspecified: Secondary | ICD-10-CM | POA: Diagnosis not present

## 2018-08-31 DIAGNOSIS — A419 Sepsis, unspecified organism: Secondary | ICD-10-CM | POA: Diagnosis not present

## 2018-08-31 DIAGNOSIS — Z8551 Personal history of malignant neoplasm of bladder: Secondary | ICD-10-CM | POA: Diagnosis not present

## 2018-08-31 DIAGNOSIS — M6281 Muscle weakness (generalized): Secondary | ICD-10-CM | POA: Diagnosis not present

## 2018-08-31 DIAGNOSIS — R1312 Dysphagia, oropharyngeal phase: Secondary | ICD-10-CM | POA: Diagnosis not present

## 2018-08-31 DIAGNOSIS — Z9981 Dependence on supplemental oxygen: Secondary | ICD-10-CM | POA: Diagnosis not present

## 2018-08-31 DIAGNOSIS — I2510 Atherosclerotic heart disease of native coronary artery without angina pectoris: Secondary | ICD-10-CM | POA: Diagnosis not present

## 2018-08-31 DIAGNOSIS — J9601 Acute respiratory failure with hypoxia: Secondary | ICD-10-CM | POA: Diagnosis not present

## 2018-08-31 DIAGNOSIS — K5790 Diverticulosis of intestine, part unspecified, without perforation or abscess without bleeding: Secondary | ICD-10-CM | POA: Diagnosis not present

## 2018-08-31 DIAGNOSIS — J441 Chronic obstructive pulmonary disease with (acute) exacerbation: Secondary | ICD-10-CM | POA: Diagnosis not present

## 2018-08-31 DIAGNOSIS — A4189 Other specified sepsis: Secondary | ICD-10-CM | POA: Diagnosis not present

## 2018-08-31 DIAGNOSIS — I1310 Hypertensive heart and chronic kidney disease without heart failure, with stage 1 through stage 4 chronic kidney disease, or unspecified chronic kidney disease: Secondary | ICD-10-CM | POA: Diagnosis not present

## 2018-08-31 DIAGNOSIS — E1142 Type 2 diabetes mellitus with diabetic polyneuropathy: Secondary | ICD-10-CM | POA: Diagnosis not present

## 2018-08-31 DIAGNOSIS — E1129 Type 2 diabetes mellitus with other diabetic kidney complication: Secondary | ICD-10-CM | POA: Diagnosis not present

## 2018-08-31 DIAGNOSIS — Z936 Other artificial openings of urinary tract status: Secondary | ICD-10-CM | POA: Diagnosis not present

## 2018-08-31 DIAGNOSIS — N184 Chronic kidney disease, stage 4 (severe): Secondary | ICD-10-CM

## 2018-08-31 DIAGNOSIS — R410 Disorientation, unspecified: Secondary | ICD-10-CM | POA: Diagnosis not present

## 2018-08-31 DIAGNOSIS — J101 Influenza due to other identified influenza virus with other respiratory manifestations: Secondary | ICD-10-CM | POA: Diagnosis not present

## 2018-08-31 DIAGNOSIS — Z7902 Long term (current) use of antithrombotics/antiplatelets: Secondary | ICD-10-CM | POA: Diagnosis not present

## 2018-08-31 DIAGNOSIS — I25118 Atherosclerotic heart disease of native coronary artery with other forms of angina pectoris: Secondary | ICD-10-CM | POA: Diagnosis not present

## 2018-08-31 DIAGNOSIS — G4700 Insomnia, unspecified: Secondary | ICD-10-CM | POA: Diagnosis not present

## 2018-08-31 DIAGNOSIS — J440 Chronic obstructive pulmonary disease with acute lower respiratory infection: Secondary | ICD-10-CM | POA: Diagnosis not present

## 2018-08-31 DIAGNOSIS — Z7984 Long term (current) use of oral hypoglycemic drugs: Secondary | ICD-10-CM | POA: Diagnosis not present

## 2018-08-31 DIAGNOSIS — J90 Pleural effusion, not elsewhere classified: Secondary | ICD-10-CM | POA: Diagnosis not present

## 2018-08-31 DIAGNOSIS — R0902 Hypoxemia: Secondary | ICD-10-CM

## 2018-08-31 DIAGNOSIS — R319 Hematuria, unspecified: Secondary | ICD-10-CM | POA: Diagnosis present

## 2018-08-31 DIAGNOSIS — F329 Major depressive disorder, single episode, unspecified: Secondary | ICD-10-CM | POA: Diagnosis present

## 2018-08-31 DIAGNOSIS — Z7982 Long term (current) use of aspirin: Secondary | ICD-10-CM | POA: Diagnosis not present

## 2018-08-31 DIAGNOSIS — F3341 Major depressive disorder, recurrent, in partial remission: Secondary | ICD-10-CM | POA: Diagnosis not present

## 2018-08-31 DIAGNOSIS — F419 Anxiety disorder, unspecified: Secondary | ICD-10-CM | POA: Diagnosis present

## 2018-08-31 DIAGNOSIS — Z79899 Other long term (current) drug therapy: Secondary | ICD-10-CM | POA: Diagnosis not present

## 2018-08-31 DIAGNOSIS — I10 Essential (primary) hypertension: Secondary | ICD-10-CM | POA: Diagnosis not present

## 2018-08-31 DIAGNOSIS — R451 Restlessness and agitation: Secondary | ICD-10-CM | POA: Diagnosis not present

## 2018-08-31 DIAGNOSIS — R0602 Shortness of breath: Secondary | ICD-10-CM | POA: Diagnosis present

## 2018-08-31 DIAGNOSIS — J449 Chronic obstructive pulmonary disease, unspecified: Secondary | ICD-10-CM | POA: Diagnosis not present

## 2018-08-31 DIAGNOSIS — Z7189 Other specified counseling: Secondary | ICD-10-CM | POA: Diagnosis not present

## 2018-08-31 DIAGNOSIS — K219 Gastro-esophageal reflux disease without esophagitis: Secondary | ICD-10-CM | POA: Diagnosis not present

## 2018-08-31 DIAGNOSIS — Z87891 Personal history of nicotine dependence: Secondary | ICD-10-CM | POA: Diagnosis not present

## 2018-08-31 DIAGNOSIS — E119 Type 2 diabetes mellitus without complications: Secondary | ICD-10-CM | POA: Diagnosis not present

## 2018-08-31 DIAGNOSIS — N281 Cyst of kidney, acquired: Secondary | ICD-10-CM | POA: Diagnosis not present

## 2018-08-31 DIAGNOSIS — Z515 Encounter for palliative care: Secondary | ICD-10-CM | POA: Diagnosis not present

## 2018-08-31 DIAGNOSIS — I252 Old myocardial infarction: Secondary | ICD-10-CM | POA: Diagnosis not present

## 2018-08-31 DIAGNOSIS — Z436 Encounter for attention to other artificial openings of urinary tract: Secondary | ICD-10-CM | POA: Diagnosis not present

## 2018-08-31 DIAGNOSIS — R262 Difficulty in walking, not elsewhere classified: Secondary | ICD-10-CM | POA: Diagnosis not present

## 2018-08-31 DIAGNOSIS — I129 Hypertensive chronic kidney disease with stage 1 through stage 4 chronic kidney disease, or unspecified chronic kidney disease: Secondary | ICD-10-CM | POA: Diagnosis not present

## 2018-08-31 DIAGNOSIS — E785 Hyperlipidemia, unspecified: Secondary | ICD-10-CM | POA: Diagnosis not present

## 2018-08-31 DIAGNOSIS — Z741 Need for assistance with personal care: Secondary | ICD-10-CM | POA: Diagnosis not present

## 2018-08-31 DIAGNOSIS — C3491 Malignant neoplasm of unspecified part of right bronchus or lung: Secondary | ICD-10-CM | POA: Diagnosis not present

## 2018-08-31 DIAGNOSIS — D693 Immune thrombocytopenic purpura: Secondary | ICD-10-CM | POA: Diagnosis not present

## 2018-08-31 DIAGNOSIS — D696 Thrombocytopenia, unspecified: Secondary | ICD-10-CM | POA: Diagnosis not present

## 2018-08-31 DIAGNOSIS — R310 Gross hematuria: Secondary | ICD-10-CM | POA: Diagnosis not present

## 2018-08-31 DIAGNOSIS — G250 Essential tremor: Secondary | ICD-10-CM | POA: Diagnosis not present

## 2018-08-31 DIAGNOSIS — E875 Hyperkalemia: Secondary | ICD-10-CM | POA: Diagnosis not present

## 2018-08-31 DIAGNOSIS — Z9989 Dependence on other enabling machines and devices: Secondary | ICD-10-CM | POA: Diagnosis not present

## 2018-08-31 LAB — GLUCOSE, CAPILLARY
Glucose-Capillary: 155 mg/dL — ABNORMAL HIGH (ref 70–99)
Glucose-Capillary: 316 mg/dL — ABNORMAL HIGH (ref 70–99)
Glucose-Capillary: 158 mg/dL — ABNORMAL HIGH (ref 70–99)

## 2018-08-31 MED ORDER — BUDESONIDE-FORMOTEROL FUMARATE 160-4.5 MCG/ACT IN AERO: 2.0000 | INHALATION_SPRAY | Freq: Two times a day (BID) | RESPIRATORY_TRACT | Status: AC

## 2018-08-31 MED ORDER — PREDNISONE 20 MG PO TABS: ORAL_TABLET | ORAL | Status: DC

## 2018-08-31 MED ORDER — ALPRAZOLAM 0.5 MG PO TABS: 0.5000 mg | ORAL_TABLET | Freq: Every day | ORAL | 0 refills | Status: DC

## 2018-08-31 MED ORDER — DOXYCYCLINE HYCLATE 100 MG PO TABS: 100.0000 mg | ORAL_TABLET | Freq: Two times a day (BID) | ORAL | Status: DC

## 2018-08-31 NOTE — Evaluation (Signed)
Clinical/Bedside Swallow Evaluation Patient Details  Name: Kyle [name] MRN: [id number] Date of Birth: Jan 05, 1934  Today's Date: 08/31/2018 Time: SLP Start Time (ACUTE ONLY): 1140 SLP Stop Time (ACUTE ONLY): 1206 SLP Time Calculation (min) (ACUTE ONLY): 26 min  Past Medical History:  Past Medical History:  Diagnosis Date  . AAA (abdominal aortic aneurysm) ([place]) 2004   s/p repair 2004; 4.3 cm infrarenal in 05/2011  . Abnormality of gait 02/23/2013  . Adenocarcinoma of right lung ([place]) 02/24/2011   Ct A/P 2012:  2cm lung mass RLL PET 6712:  Hypermetabolic RLL mass, no other hypermetabolic areas. TTNA 02/2011:  Adenocarcinoma, markers c/w lung origin Right lower lobe superior segmentectomy. 04/01/2011 Dr. Arlyce Dice   . Adenocarcinoma, lung ([place]) 01/2011   transthoracic FNA; resection of the superior segment of the RLL in 03/2011; negative nodes; no chemotherapy nor radiation planned  . Arm fracture    right arm  . Arteriosclerotic cardiovascular disease (ASCVD) 1973, 12/2010   S/P NSTEMI secondary to distal RCA/PL lesion, tx medically.  EF of  55%-60% per  echo.  . Benign prostatic hypertrophy    s/p transurethral resection of the prostate  . Bilateral renal masses    Cystic, more prominent on CT in 12/2010 than 2007; followed by Dr. Rosana Hoes  . Bladder cancer [facility]) 1996   Transurethral resection of the bladder + chemotherapy/BCG as premed  . Cataract   . Chronic kidney disease    Creatinine 1.4 on discharge 12/20/2100; proteinuria; normal renal ultrasound in 2010; recent creatinines of 1.7-2.; Bilateral cystic renal masses by CT in 2011  . COPD (chronic obstructive pulmonary disease) ([place])   . Coronary artery disease   . Cough    thick phlegm  . Diabetes mellitus    Type II  . Diplopia 02/23/2013  . Diverticulosis   . Essential and other specified forms of tremor 02/23/2013  . Hx of Clostridium difficile infection   . Hyperlipidemia   . Hypertension   . Insomnia   . ITP (idiopathic  thrombocytopenic purpura) 09/07/2012   Chronic ITP of adults versus medication-induced ITP.  Stable  . Myocardial infarction ([place])   . Nephrolithiasis 2012   ARF in 01/2011 due to obstructing nephrolithiasis  . Obesity   . OSA (obstructive sleep apnea)   . Polyneuropathy in diabetes(357.2) 02/23/2013  . Thrombocytopenia ([place])   . Tobacco abuse    50-pack-year consumption; quit in 12/2010  . Tubular adenoma of colon   . Ventral hernia    Past Surgical History:  Past Surgical History:  Procedure Laterality Date  . ABDOMINAL AORTIC ANEURYSM REPAIR  2004  . CARDIAC CATHETERIZATION    . COLONOSCOPY  03/19/2010   Dr. Gala Romney -(poor prep) Anal papilla, rectal hyperplastic polyp, tubular adenoma removed splenic flexure, left-sided diverticula  . COLONOSCOPY  11/28/2004   RMR:  Diminutive rectal and left colon polyps as described above, cold  biopsied/removed/  Left sided diverticula. The remainder of the colonic mucosa appeared normal.  . COLONOSCOPY   09/15/01   RMR: Multiple diminutive polyps destroyed with dermolysis as described above/ Multiple small polyps on stalks in the colon resected with snare cautery/ Scattered pan colonic diverticulum/ The remainder of the colonic mucosa appeared normal  . COLONOSCOPY N/A 05/01/2015   Procedure: COLONOSCOPY;  Surgeon: Daneil Dolin, MD;  [facility];  Service: Endoscopy;  Laterality: N/A;  1115  . CYSTECTOMY    . CYSTOSCOPY  04/2014  . CYSTOSTOMY W/ BLADDER BIOPSY    . FLEXIBLE SIGMOIDOSCOPY  2014   Dr. Olevia Perches: tubular adenoma, negative stool studies   . IR EXT NEPHROURETERAL CATH EXCHANGE  07/15/2018  . LUNG LOBECTOMY    . TONSILLECTOMY    . TRANSURETHRAL RESECTION OF PROSTATE    . VIDEO BRONCHOSCOPY WITH ENDOBRONCHIAL NAVIGATION N/A 10/10/2013   Procedure: VIDEO BRONCHOSCOPY WITH ENDOBRONCHIAL NAVIGATION;  Surgeon: Melrose Nakayama, MD;  Location: [place];  Service: Thoracic;  Laterality: N/A;  NO BLOOD THINNERS BUT PATIENT HAS ITP   . WEDGE RESECTION  04/2011   carcinoma of lung   HPI:  85 y.o.male,With history ofadenocarcinoma of right lung status post right lower lobe superior segmentectomy in 2012, bladder cancer status post transurethral resection of the bladder, right nephrostomy tube in place, coronary artery disease, COPD, obstructive sleep apnea on CPAP at bedtime and hypertension; who came to hospital withworsening shortness of breath.Found with respiratory failure with hypoxia due to COPD exacerbation and bronchiectasis. BSE requested due to family reports that Pt experiences episodic coughing with thin liquids.   Assessment / Plan / Recommendation Clinical Impression  Clinical swallow evaluation completed at bedside with family present. Pt's wife reports history (couple years) of occasional choking/coughing episodes when drinking thin liquids. He typically uses a wide silicone straw at home due to bilateral tremors which impact self feeding. Pt's wife reports that she noted difficulty with liquids prior to use of this particular straw. Oral motor examination is WNL, no gross asymmetry or weakness noted. Pt with strong, congested cough. Pt assessed with ice chips, thin water via cup and straw, puree, and regular textures. Pt without signs of reduced airway protection this date, however it is possible that Pt experiences episodic penetration/aspiration with thin liquids due to suspected premature spillage and due to taking in large sips with straw. Pt with prolonged mastication with dry cracker and presented with delayed cough following cracker and sip of thin.   Recommend continuing diet as ordered, Regular and thin, with aspiration precautions (small sips, dry swallow after primary swallow, sit upright for all po and remain upright for at least 30 minutes after). Pt and family are in agreement with plan of care. SLP will follow for diet tolerance and ongoing education as needed.   SLP Visit Diagnosis: Dysphagia,  oropharyngeal phase (R13.12)    Aspiration Risk  Mild aspiration risk    Diet Recommendation Regular;Thin liquid   Liquid Administration via: Cup;Straw Medication Administration: Whole meds with liquid Supervision: Patient able to self feed;Staff to assist with self feeding(may need assist due to tremor) Compensations: Small sips/bites;Slow rate;Multiple dry swallows after each bite/sip Postural Changes: Seated upright at 90 degrees;Remain upright for at least 30 minutes after po intake    Other  Recommendations Oral Care Recommendations: Oral care BID;Staff/trained caregiver to provide oral care Other Recommendations: Clarify dietary restrictions   Follow up Recommendations None      Frequency and Duration min 2x/week  1 week       Prognosis Prognosis for Safe Diet Advancement: Good      Swallow Study   General Date of Onset: 08/28/18 HPI: 85 y.o.male,With history ofadenocarcinoma of right lung status post right lower lobe superior segmentectomy in 2012, bladder cancer status post transurethral resection of the bladder, right nephrostomy tube in place, coronary artery disease, COPD, obstructive sleep apnea on CPAP at bedtime and hypertension; who came to hospital withworsening shortness of breath.Found with respiratory failure with hypoxia due to COPD exacerbation and bronchiectasis. BSE requested due to family reports that Pt experiences episodic coughing with thin  liquids. Type of Study: Bedside Swallow Evaluation Previous Swallow Assessment: None on record Diet Prior to this Study: Regular;Thin liquids Temperature Spikes Noted: No Respiratory Status: Nasal cannula History of Recent Intubation: No Behavior/Cognition: Alert;Cooperative;Pleasant mood Oral Cavity Assessment: Within Functional Limits Oral Care Completed by SLP: No Oral Cavity - Dentition: Adequate natural dentition Vision: Functional for self-feeding Self-Feeding Abilities: Needs assist(Pt with  tremor) Baseline Vocal Quality: Normal Volitional Cough: Strong;Congested Volitional Swallow: Able to elicit    Oral/Motor/Sensory Function Overall Oral Motor/Sensory Function: Within functional limits   Ice Chips Ice chips: Within functional limits Presentation: Spoon   Thin Liquid Thin Liquid: Within functional limits Presentation: Cup;Straw    Nectar Thick Nectar Thick Liquid: Not tested   Honey Thick Honey Thick Liquid: Not tested   Puree Puree: Within functional limits Presentation: Spoon   Solid     Solid: Impaired Presentation: Self Fed Oral Phase Impairments: Impaired mastication(xerostomia) Oral Phase Functional Implications: Prolonged oral transit;Oral residue Pharyngeal Phase Impairments: Cough - Delayed     Thank you,  Genene Churn, [place]  Madhav Mohon 08/31/2018,12:17 PM

## 2018-08-31 NOTE — Clinical Social Work Placement (Signed)
   CLINICAL SOCIAL WORK PLACEMENT  NOTE  Date:  08/31/2018  Patient Details  Name: Kyle [name] MRN: [id number] Date of Birth: 16-May-1934  Clinical Social Work is seeking post-discharge placement for this patient at the [place] level of care (*CSW will initial, date and re-position this form in  chart as items are completed):  Yes   Patient/family provided with [place] Work Department's list of facilities offering this level of care within the geographic area requested by the patient (or if unable, by the patient's family).  Yes   Patient/family informed of their freedom to choose among providers that offer the needed level of care, that participate in Medicare, Medicaid or managed care program needed by the patient, have an available bed and are willing to accept the patient.  Yes   Patient/family informed of [facility]'s ownership interest in [facility] and [facility], as well as of the fact that they are under no obligation to receive care at these facilities.  PASRR submitted to EDS on       PASRR number received on       Existing PASRR number confirmed on 08/30/18     FL2 transmitted to all facilities in geographic area requested by pt/family on 08/30/18     FL2 transmitted to all facilities within larger geographic area on       Patient informed that his/her managed care company has contracts with or will negotiate with certain facilities, including the following:        Yes   Patient/family informed of bed offers received.  Patient chooses bed at [facility]     Physician recommends and patient chooses bed at      Patient to be transferred to [facility] on 08/31/18.  Patient to be transferred to facility by aph staff     Patient family notified on 08/31/18 of transfer.  Name of family member notified:  Mrs. Forgione     PHYSICIAN       Additional Comment:  Discharge clinicals sent. LCSW signing off.    _______________________________________________ Ihor Gully, LCSW 08/31/2018, 4:11 PM

## 2018-08-31 NOTE — Plan of Care (Signed)
  Problem: Education: Goal: Knowledge of General Education information will improve Description Including pain rating scale, medication(s)/side effects and non-pharmacologic comfort measures 08/31/2018 1630 by Rance Muir, RN Outcome: Adequate for Discharge 08/31/2018 1050 by Rance Muir, RN Outcome: Progressing   Problem: Health Behavior/Discharge Planning: Goal: Ability to manage health-related needs will improve 08/31/2018 1630 by Rance Muir, RN Outcome: Adequate for Discharge 08/31/2018 1050 by Rance Muir, RN Outcome: Progressing   Problem: Clinical Measurements: Goal: Ability to maintain clinical measurements within normal limits will improve 08/31/2018 1630 by Rance Muir, RN Outcome: Adequate for Discharge 08/31/2018 1050 by Rance Muir, RN Outcome: Progressing Goal: Will remain free from infection 08/31/2018 1630 by Rance Muir, RN Outcome: Adequate for Discharge 08/31/2018 1050 by Rance Muir, RN Outcome: Progressing Goal: Diagnostic test results will improve 08/31/2018 1630 by Rance Muir, RN Outcome: Adequate for Discharge 08/31/2018 1050 by Rance Muir, RN Outcome: Progressing Goal: Respiratory complications will improve 08/31/2018 1630 by Rance Muir, RN Outcome: Adequate for Discharge 08/31/2018 1050 by Rance Muir, RN Outcome: Progressing Goal: Cardiovascular complication will be avoided 08/31/2018 1630 by Rance Muir, RN Outcome: Adequate for Discharge 08/31/2018 1050 by Rance Muir, RN Outcome: Progressing   Problem: Activity: Goal: Risk for activity intolerance will decrease 08/31/2018 1630 by Rance Muir, RN Outcome: Adequate for Discharge 08/31/2018 1050 by Rance Muir, RN Outcome: Progressing   Problem: Nutrition: Goal: Adequate nutrition will be maintained 08/31/2018 1630 by Rance Muir, RN Outcome: Adequate for Discharge 08/31/2018 1050 by Rance Muir, RN Outcome: Progressing   Problem: Coping: Goal: Level of anxiety will decrease 08/31/2018 1630 by Rance Muir, RN Outcome: Adequate for  Discharge 08/31/2018 1050 by Rance Muir, RN Outcome: Progressing   Problem: Elimination: Goal: Will not experience complications related to bowel motility 08/31/2018 1630 by Rance Muir, RN Outcome: Adequate for Discharge 08/31/2018 1050 by Rance Muir, RN Outcome: Progressing Goal: Will not experience complications related to urinary retention 08/31/2018 1630 by Rance Muir, RN Outcome: Adequate for Discharge 08/31/2018 1050 by Rance Muir, RN Outcome: Progressing   Problem: Pain Managment: Goal: General experience of comfort will improve 08/31/2018 1630 by Rance Muir, RN Outcome: Adequate for Discharge 08/31/2018 1050 by Rance Muir, RN Outcome: Progressing   Problem: Safety: Goal: Ability to remain free from injury will improve 08/31/2018 1630 by Rance Muir, RN Outcome: Adequate for Discharge 08/31/2018 1050 by Rance Muir, RN Outcome: Progressing   Problem: Skin Integrity: Goal: Risk for impaired skin integrity will decrease 08/31/2018 1630 by Rance Muir, RN Outcome: Adequate for Discharge 08/31/2018 1050 by Rance Muir, RN Outcome: Progressing

## 2018-08-31 NOTE — Progress Notes (Signed)
Inpatient Diabetes Program Recommendations  AACE/ADA: New Consensus Statement on Inpatient Glycemic Control   Target Ranges:  Prepandial:   less than 140 mg/dL      Peak postprandial:   less than 180 mg/dL (1-2 hours)      Critically ill patients:  140 - 180 mg/dL  Results for Kyle [name], Kyle [name] (MRN [id number]) as of 08/31/2018 11:14  Ref. Range 08/30/2018 07:34 08/30/2018 11:10 08/30/2018 16:35 08/30/2018 21:39 08/31/2018 07:47  Glucose-Capillary Latest Ref Range: 70 - 99 mg/dL 182 (H) 220 (H) 265 (H) 244 (H) 155 (H)   Results for Kyle [name], Kyle [name] (MRN [id number]) as of 08/30/2018 07:36  Ref. Range 08/29/2018 08:04 08/29/2018 11:19 08/29/2018 16:21 08/29/2018 21:09  Glucose-Capillary Latest Ref Range: 70 - 99 mg/dL 183 (H) 301 (H) 245 (H) 244 (H)  Results for Kyle [name], Kyle [name] (MRN [id number]) as of 08/30/2018 07:36  Ref. Range 08/11/2018 14:43 08/28/2018 03:35  Hemoglobin A1C Latest Ref Range: 4.8 - 5.6 % 5.0 5.2   Review of Glycemic Control  Diabetes history: DM2 (diet controlled) Outpatient Diabetes medications: None Current orders for Inpatient glycemic control: Novolog 0-9 units TID with meals; Solumedrol 60 mg Q12H  Inpatient Diabetes Program Recommendations:   Correction (SSI): Please consider adding Novolog 0-5 units QHS for bedtime correction. Insulin - Meal Coverage: Post prandial glucose is consistently elevated. If steroids are continued, please consider ordering Novolog 3 units TID with meals for meal coverage if patient eats at least 50% of meals.  Thanks, Barnie Alderman, RN, MSN, CDE Diabetes Coordinator Inpatient Diabetes Program [phone] (Team Pager from 8am to 5pm)

## 2018-08-31 NOTE — Progress Notes (Signed)
Physical Therapy Treatment Patient Details Name: Kyle [name] MRN: [id number] DOB: Dec 01, 1933 Today's Date: 08/31/2018    History of Present Illness Kyle [name]  is a 85 y.o. male,With history of adenocarcinoma of right lung status post right lower lobe superior segmentectomy in 2012, bladder cancer status post transurethral resection of the bladder, right nephrostomy tube in place, coronary artery disease, COPD, obstructive sleep apnea on CPAP at bedtime, hypertension came to hospital with worsening shortness of breath.  Patient says that he was seen by his PCP 10 days ago and was prescribed amoxicillin he took it for 7 to 10 days with not much improvement today today he went back to the PCP office and was prescribed doxycycline but patient was so weak at home that family brought him to the hospital for further evaluation.    PT Comments    PT increased in distance ambulated.  Pt limitations are respiratory in nature more than physical.     Follow Up Recommendations  SNF;Home health PT     Equipment Recommendations  None recommended by PT    Recommendations for Other Services  none     Precautions / Restrictions   none   Mobility  Bed Mobility   Bed Mobility: Supine to Sit     Supine to sit: Modified independent (Device/Increase time)     General bed mobility comments: slow labored movement with difficulty moving legs off bed  Transfers   Equipment used: Rolling walker (2 wheeled) Transfers: Sit to/from Stand Sit to Stand: Modified independent (Device/Increase time) Stand pivot transfers: Modified independent (Device/Increase time)       General transfer comment: labored movement  Ambulation/Gait Ambulation/Gait assistance: Supervision Gait Distance (Feet): 60 Feet Assistive device: Rolling walker (2 wheeled) Gait Pattern/deviations: Decreased step length - right;Decreased step length - left;Decreased stride length Gait velocity: decreased   General Gait  Details: slow slightly labored cadence without loss of balance, limited mostly due to SOB           Cognition Arousal/Alertness: Awake/alert   Overall Cognitive Status: Within Functional Limits for tasks assessed                                        Exercises General Exercises - Lower Extremity Ankle Circles/Pumps: 10 reps;Both Long Arc Quad: Strengthening;10 reps;Both;Seated Heel Slides: 10 reps;Both Hip ABduction/ADduction: 5 reps;Strengthening;Both        Pertinent Vitals/Pain  none vocalized     Home Living     lives with wife uses O2 at night, ambulates with a walker.                       PT Goals (current goals can now be found in the care plan section) Acute Rehab PT Goals Patient Stated Goal: return home after rehab PT Goal Formulation: With patient/family Time For Goal Achievement: 09/13/18 Potential to Achieve Goals: Good Progress towards PT goals: Progressing toward goals    Frequency    Min 3X/week      PT Plan Current plan remains appropriate    Co-evaluation              AM-PAC PT "6 Clicks" Mobility   Outcome Measure  Help needed turning from your back to your side while in a flat bed without using bedrails?: A Little Help needed moving from lying on your back to sitting on the side  of a flat bed without using bedrails?: A Little Help needed moving to and from a bed to a chair (including a wheelchair)?: A Little Help needed standing up from a chair using your arms (e.g., wheelchair or bedside chair)?: A Little Help needed to walk in hospital room?: A Little Help needed climbing 3-5 steps with a railing? : A Lot 6 Click Score: 17    End of Session Equipment Utilized During Treatment: Gait belt Activity Tolerance: Patient tolerated treatment well;Patient limited by fatigue Patient left: in chair;with call bell/phone within reach;with family/visitor present Nurse Communication: Mobility status PT Visit  Diagnosis: Unsteadiness on feet (R26.81);Other abnormalities of gait and mobility (R26.89);Muscle weakness (generalized) (M62.81)     Time: 5009-3818 PT Time Calculation (min) (ACUTE ONLY): 25 min  Charges:  $Gait Training: 8-22 mins                        Rayetta Humphrey, PT CLT [phone] 08/31/2018, 10:21 AM

## 2018-08-31 NOTE — Progress Notes (Signed)
SATURATION QUALIFICATIONS: (This note is used to comply with regulatory documentation for home oxygen  Patient Saturations on Room Air while Ambulating = 84%

## 2018-08-31 NOTE — Plan of Care (Signed)

## 2018-08-31 NOTE — Progress Notes (Signed)
Report called to [facility] at this time

## 2018-08-31 NOTE — Discharge Summary (Signed)
Physician Discharge Summary  Kyle [name] TML:[id number] DOB: 09-Mar-1934 DOA: 08/27/2018  PCP: Mikey Kirschner, MD  Admit date: 08/27/2018 Discharge date: 08/31/2018  Time spent: 35 minutes  Recommendations for Outpatient Follow-up:  1. Reassess blood pressure and further adjust antihypertensive regimen as needed 2. Repeat basic metabolic panel in 1 week to follow electrolytes and renal function 3. Slowly continue weaning oxygen supplementation as tolerated.   Discharge Diagnoses:  Active Problems:   COPD with acute bronchitis (HCC)   COPD exacerbation (HCC)   Hypoxia   Gastroesophageal reflux disease   Benign essential HTN Stage IV chronic kidney disease Dysphagia Obstructive sleep apnea Type 2 diabetes mellitus with nephropathy (currently diet controlled).   Discharge Condition: Stable and improved.  Patient discharged to a skilled nursing facility for further care, rehabilitation and conditioning.  Diet recommendation: Heart healthy and modified carbohydrate diet  Filed Weights   08/27/18 1950  Weight: 108 kg    History of present illness:  As per H&P written by Dr. Darrick Meigs on 08/28/2018 85 y.o. male,With history of adenocarcinoma of right lung status post right lower lobe superior segmentectomy in 2012, bladder cancer status post transurethral resection of the bladder, right nephrostomy tube in place, coronary artery disease, COPD, obstructive sleep apnea on CPAP at bedtime, hypertension came to hospital with worsening shortness of breath.  Patient says that he was seen by his PCP 10 days ago and was prescribed amoxicillin he took it for 7 to 10 days with not much improvement today today he went back to the PCP office and was prescribed doxycycline but patient was so weak at home that family brought him to the hospital for further evaluation. He admits to coughing up gray-colored phlegm. Denies chest pain, no nausea vomiting or diarrhea. Denies abdominal pain. No previous  history of seizures or stroke. In the ED chest x-ray showed no infiltrate. Patient requiring 3 L of oxygen in the ED  Hospital Course:  1-acute respiratory failure with hypoxia: Due to COPD exacerbation and bronchiectasis. -Improved and is stable to be discharged to skilled nursing facility for further care.  Patient has required to use 2.5 nasal cannula supplementation especially with exertion. -Continue steroids (tapering) as instructed, continue antibiotics, continue nebulizer management and the use of flutter valve. -Resume the use of a Spiriva and start Symbicort. -Continue to wean oxygen as tolerated and follow clinical response. -Physical therapy evaluation requested, and a skilled nursing facility recommended. -Patient will be discharged to pain center for further care and rehabilitation.  2-Coronary artery disease -Currently not complaining of chest pain -Will continue the use of aspirin, Plavix, metoprolol and statins. -No acute abnormality suggesting ischemia appreciated on EKG or telemetry.  3-obstructive sleep apnea -Continue CPAP with 2.5 bleed in oxygen supplementation nightly.  4-essential hypertension -Is stable and well-controlled -Continue the use of metoprolol. -Heart healthy diet encouraged.  5-type 2 diabetes mellitus -Continue modified carbohydrate diet -Last A1c in January 2020 was 5.0  6-chronic kidney disease is stage IV -Stable and at baseline. -Continue to monitor renal function trend intermittently.  7-dysphagia -Appreciate evaluation by speech therapy -No abnormalities found during assessment -Patient instructed to take his time while eating and drinking; to maintain moist food to prevent coughing spells while eating and to be in upright position during meals and at least 40 minutes after meals.  Procedures:  See below for x-ray reports  Consultations:  None  Discharge Exam: Vitals:   08/31/18 1413 08/31/18 1445  BP:  127/83   Pulse:  60  Resp:  19  Temp:  97.6 F (36.4 C)  SpO2: 94% 97%   General exam: Alert, awake, oriented x 3; denies chest pain, no nausea, no vomiting.  Reports breathing continued to improve.  Still using 2-2.5 L nasal cannula supplementation especially on exertion. Respiratory system: Improved air movement bilaterally, positive rhonchi right, no crackles, mild expiratory wheezing at the end of expiratory phase. Cardiovascular system:Rate controlled, no rubs, no gallops.  Positive systolic ejection murmur appreciated on exam. Gastrointestinal system: Abdomen is nondistended, soft and nontender. No organomegaly or masses felt. Normal bowel sounds heard. Central nervous system: Alert and oriented. No focal neurological deficits. Extremities: No cyanosis or clubbing.   Skin: No rashes, lesions or ulcers Psychiatry: Judgement and insight appear normal. Mood & affect appropriate.    Discharge Instructions   Discharge Instructions    Diet - low sodium heart healthy   Complete by:  As directed    Discharge instructions   Complete by:  As directed    Take medications as prescribed Physical therapy rehabilitation as per skilled nursing facility protocol Maintain adequate hydration Follow heart healthy diet Patient has been discharged on oxygen supplementation 2.5 L around-the-clock and the use of CPAP with 2.5 L oxygen bled in at bedtime. -Please wean oxygen supplementation as tolerated and arrange outpatient follow-up with PCP in 2 weeks after discharge from skilled nursing facility.     Allergies as of 08/31/2018      Reactions   Codeine Anaphylaxis   Etodolac Other (See Comments)   dizziness   Zanaflex [tizanidine Hcl] Other (See Comments)   Drowsiness      Medication List    STOP taking these medications   amoxicillin 500 MG capsule Commonly known as:  AMOXIL     TAKE these medications   albuterol 108 (90 Base) MCG/ACT inhaler Commonly known as:  PROAIR HFA INHALE 2 PUFFS  INTO THE LUNGS 4 TIMES DAILY AS NEEDED. What changed:    how much to take  how to take this  when to take this  reasons to take this   ALPRAZolam 0.5 MG tablet Commonly known as:  XANAX Take 1 tablet (0.5 mg total) by mouth at bedtime. *May take an additional tablet daily as needed for anxiety/sleep   aspirin EC 81 MG tablet Take 81 mg by mouth every morning.   B-12 2500 MCG Tabs Take 1 tablet by mouth every morning.   budesonide-formoterol 160-4.5 MCG/ACT inhaler Commonly known as:  SYMBICORT Inhale 2 puffs into the lungs 2 (two) times daily.   cholecalciferol 1000 units tablet Commonly known as:  VITAMIN D Take 2,000 Units by mouth every morning.   citalopram 20 MG tablet Commonly known as:  CELEXA TAKE ONE (1) TABLET BY MOUTH EVERY DAY What changed:    how much to take  how to take this  when to take this  additional instructions   clopidogrel 75 MG tablet Commonly known as:  PLAVIX Take 75 mg by mouth every morning.   doxycycline 100 MG tablet Commonly known as:  VIBRA-TABS Take 1 tablet (100 mg total) by mouth 2 (two) times daily for 4 days.   fluticasone 50 MCG/ACT nasal spray Commonly known as:  FLONASE Place 1 spray into both nostrils daily. What changed:  when to take this   ipratropium-albuterol 0.5-2.5 (3) MG/3ML Soln Commonly known as:  DUONEB Take 3 mLs by nebulization every 4 (four) hours as needed.   metoprolol tartrate 50 MG tablet Commonly known as:  LOPRESSOR TAKE ONE-HALF TABLET BY MOUTH TWICE DAILY What changed:    how much to take  how to take this  when to take this  additional instructions   mometasone 0.1 % cream Commonly known as:  ELOCON Apply 1 application topically daily. Affected area What changed:    when to take this  reasons to take this   NAC PO Take 1 capsule by mouth 2 (two) times daily.   niacin 500 MG CR tablet Commonly known as:  NIASPAN Take 2 tablets (1,000 mg total) by mouth at  bedtime. What changed:  when to take this   nitroGLYCERIN 0.4 MG SL tablet Commonly known as:  NITROSTAT Place 1 tablet (0.4 mg total) under the tongue every 5 (five) minutes as needed. Call MD if need more than 2 What changed:  reasons to take this   OLANZapine 5 MG tablet Commonly known as:  ZYPREXA Take 5 mg by mouth every evening.   OSTEO BI-FLEX JOINT SHIELD Tabs Take 1 tablet by mouth 2 (two) times daily.   OXYGEN Inhale 2.5 L into the lungs at bedtime. With CPAP   pravastatin 80 MG tablet Commonly known as:  PRAVACHOL TAKE ONE (1) TABLET EACH DAY What changed:    how much to take  how to take this  when to take this  additional instructions   predniSONE 20 MG tablet Commonly known as:  DELTASONE Take 3 tablets by mouth daily x1 day; then 2 tablets by mouth daily x2 days; then 1 tablet by mouth daily x2 days; then half tablet by mouth daily x3 days and stop prednisone.   PROBIOTIC PEARLS ADVANTAGE Caps Take 1 capsule by mouth daily.   sodium bicarbonate 650 MG tablet Take 1,300 mg by mouth 2 (two) times daily.   SSD 1 % cream Generic drug:  silver sulfADIAZINE Apply 1 application topically daily.   tiotropium 18 MCG inhalation capsule Commonly known as:  SPIRIVA HANDIHALER PLACE ONE CAPSULE INTO INHALER AND INHALE DAILY What changed:    how much to take  how to take this  when to take this  additional instructions   triamcinolone cream 0.1 % Commonly known as:  KENALOG Apply 1 application topically 2 (two) times daily.      Allergies  Allergen Reactions  . Codeine Anaphylaxis  . Etodolac Other (See Comments)    dizziness  . Zanaflex [Tizanidine Hcl] Other (See Comments)    Drowsiness    Contact information for follow-up providers    Mikey Kirschner, MD. Schedule an appointment as soon as possible for a visit in 2 week(s).   Specialty:  Family Medicine Why:  After discharge from the skilled nursing facility. Contact information: [place] [address] [phone]            Contact information for after-discharge care    [facility] .   Service:  Skilled Nursing Contact information: [address] [place] [phone]                  The results of significant diagnostics from this hospitalization (including imaging, microbiology, ancillary and laboratory) are listed below for reference.    Significant Diagnostic Studies: Dg Chest 2 View  Result Date: 08/27/2018 CLINICAL DATA:  Shortness of breath with exertion. History of bladder and lung cancer. EXAM: CHEST - 2 VIEW COMPARISON:  Chest radiograph June 20, 2018 FINDINGS: Cardiac silhouette is  mildly enlarged. Tortuous, but possibly ectatic calcified aorta. Chronic interstitial changes with strandy densities RIGHT lung base. Similar small pleural effusion versus pleural thickening posteriorly. No pneumothorax. Soft tissue planes and included osseous structures are unchanged. Osteopenia. IMPRESSION: 1. Stable cardiomegaly. 2. Chronic interstitial changes/COPD with RIGHT lung base atelectasis. Small posterior pleural effusion versus pleural thickening. 3.  Aortic Atherosclerosis (ICD10-I70.0). Electronically Signed   By: Elon Alas M.D.   On: 08/27/2018 21:17   Labs: Basic Metabolic Panel: Recent Labs  Lab 08/27/18 2045 08/28/18 0335 08/30/18 0625  NA 142 143 145  K 4.2 4.1 4.5  CL 111 112* 112*  CO2 23 22 23   GLUCOSE 127* 146* 185*  BUN 45* 42* 60*  CREATININE 3.25* 2.97* 3.22*  CALCIUM 8.8* 9.0 9.3   Liver Function Tests: Recent Labs  Lab 08/27/18 2045 08/28/18 0335  AST 10* 9*  ALT 8 9  ALKPHOS 51 53  BILITOT 1.5* 1.7*  PROT 6.8 6.9  ALBUMIN 3.3* 3.4*   CBC: Recent Labs  Lab 08/27/18 2045 08/28/18 0335 08/30/18 0625  WBC 9.0 9.3 10.6*  NEUTROABS 6.9  --   --   HGB 8.7* 9.3* 8.6*  HCT 29.7* 31.4* 29.2*  MCV 102.8* 102.6*  104.7*  PLT 91* 84* 101*   CBG: Recent Labs  Lab 08/30/18 1110 08/30/18 1635 08/30/18 2139 08/31/18 0747 08/31/18 1159  GLUCAP 220* 265* 244* 155* 316*   Signed:  Barton Dubois MD.  [facility] 08/31/2018, 3:51 PM

## 2018-09-01 ENCOUNTER — Other Ambulatory Visit: Payer: Self-pay

## 2018-09-01 MED ORDER — ALPRAZOLAM 0.5 MG PO TABS: 0.5000 mg | ORAL_TABLET | Freq: Every day | ORAL | 0 refills | Status: AC

## 2018-09-01 NOTE — Telephone Encounter (Signed)
RX Fax for [facility]@ [phone]  

## 2018-09-02 ENCOUNTER — Encounter (HOSPITAL_COMMUNITY)
Admission: RE | Admit: 2018-09-02 | Discharge: 2018-09-02 | Disposition: A | Discharge status: Home / Self Care | Payer: Medicare Other | Source: Skilled Nursing Facility | Attending: Internal Medicine | Admitting: Internal Medicine

## 2018-09-02 ENCOUNTER — Non-Acute Institutional Stay (SKILLED_NURSING_FACILITY): Payer: Medicare Other | Admitting: Internal Medicine

## 2018-09-02 ENCOUNTER — Encounter: Payer: Self-pay | Admitting: Internal Medicine

## 2018-09-02 DIAGNOSIS — I10 Essential (primary) hypertension: Secondary | ICD-10-CM

## 2018-09-02 DIAGNOSIS — G4733 Obstructive sleep apnea (adult) (pediatric): Secondary | ICD-10-CM | POA: Diagnosis not present

## 2018-09-02 DIAGNOSIS — D696 Thrombocytopenia, unspecified: Secondary | ICD-10-CM

## 2018-09-02 DIAGNOSIS — D693 Immune thrombocytopenic purpura: Secondary | ICD-10-CM | POA: Insufficient documentation

## 2018-09-02 DIAGNOSIS — D090 Carcinoma in situ of bladder: Secondary | ICD-10-CM | POA: Insufficient documentation

## 2018-09-02 DIAGNOSIS — D0221 Carcinoma in situ of right bronchus and lung: Secondary | ICD-10-CM | POA: Insufficient documentation

## 2018-09-02 DIAGNOSIS — J441 Chronic obstructive pulmonary disease with (acute) exacerbation: Secondary | ICD-10-CM

## 2018-09-02 DIAGNOSIS — N184 Chronic kidney disease, stage 4 (severe): Secondary | ICD-10-CM

## 2018-09-02 DIAGNOSIS — Z9989 Dependence on other enabling machines and devices: Secondary | ICD-10-CM

## 2018-09-02 DIAGNOSIS — E1129 Type 2 diabetes mellitus with other diabetic kidney complication: Secondary | ICD-10-CM | POA: Insufficient documentation

## 2018-09-02 LAB — CBC WITH DIFFERENTIAL/PLATELET
Abs Immature Granulocytes: 0.17 10*3/uL — ABNORMAL HIGH (ref 0.00–0.07)
Basophils Absolute: 0 10*3/uL (ref 0.0–0.1)
Basophils Relative: 0 %
EOS PCT: 0 %
Eosinophils Absolute: 0 10*3/uL (ref 0.0–0.5)
HEMATOCRIT: 31.6 % — AB (ref 39.0–52.0)
Hemoglobin: 9.3 g/dL — ABNORMAL LOW (ref 13.0–17.0)
Immature Granulocytes: 3 %
LYMPHS ABS: 0.9 10*3/uL (ref 0.7–4.0)
Lymphocytes Relative: 13 %
MCH: 30.5 pg (ref 26.0–34.0)
MCHC: 29.4 g/dL — ABNORMAL LOW (ref 30.0–36.0)
MCV: 103.6 fL — ABNORMAL HIGH (ref 80.0–100.0)
Monocytes Absolute: 0.4 10*3/uL (ref 0.1–1.0)
Monocytes Relative: 6 %
Neutro Abs: 5 10*3/uL (ref 1.7–7.7)
Neutrophils Relative %: 78 %
Platelets: 82 10*3/uL — ABNORMAL LOW (ref 150–400)
RBC: 3.05 MIL/uL — ABNORMAL LOW (ref 4.22–5.81)
RDW: 17.5 % — AB (ref 11.5–15.5)
WBC: 6.4 10*3/uL (ref 4.0–10.5)
nRBC: 0.3 % — ABNORMAL HIGH (ref 0.0–0.2)

## 2018-09-02 LAB — BASIC METABOLIC PANEL
ANION GAP: 8 (ref 5–15)
BUN: 70 mg/dL — ABNORMAL HIGH (ref 8–23)
CO2: 24 mmol/L (ref 22–32)
Calcium: 8.8 mg/dL — ABNORMAL LOW (ref 8.9–10.3)
Chloride: 112 mmol/L — ABNORMAL HIGH (ref 98–111)
Creatinine, Ser: 3.15 mg/dL — ABNORMAL HIGH (ref 0.61–1.24)
GFR calc Af Amer: 20 mL/min — ABNORMAL LOW (ref >60)
GFR calc non Af Amer: 17 mL/min — ABNORMAL LOW (ref >60)
Glucose, Bld: 112 mg/dL — ABNORMAL HIGH (ref 70–99)
Potassium: 3.9 mmol/L (ref 3.5–5.1)
Sodium: 144 mmol/L (ref 135–145)

## 2018-09-02 NOTE — Progress Notes (Signed)
Provider: Veleta Miners MD  Location:    [facility] Room Number: 151/P Place of Service:  SNF [phone])  PCP: Mikey Kirschner, MD Patient Care Team: Mikey Kirschner, MD as PCP - General (Family Medicine) Myrlene Broker, MD (Urology) Williams Che, MD (Inactive) (Ophthalmology) Caprice Beaver, DPM (Podiatry) Lavonna Monarch, MD (Dermatology) System, Provider Not In Rourk, Cristopher Estimable, MD (Gastroenterology) Melrose Nakayama, MD as Consulting Physician (Cardiothoracic Surgery) Clance, Armando Reichert, MD as Consulting Physician (Pulmonary Disease) Herminio Commons, MD as Attending Physician (Cardiology)  Extended Emergency Contact Information Primary Emergency Contact: [name] Address: [address]          [place], [place] [address] [place] Phone: [phone] Mobile Phone: [phone] Relation: Spouse Secondary Emergency Contact: Herzig,[name] Address: [address]           [place], [place] [address] [place] Phone: [phone] Mobile Phone: [phone] Relation: Son  Code Status: Full Code Goals of Care: Advanced Directive information Advanced Directives 09/02/2018  Does Patient Have a Medical Advance Directive? Yes  Type of Advance Directive (No Data)  Does patient want to make changes to medical advance directive? No - Patient declined  Copy of Baker in Chart? -  Would patient like information on creating a medical advance directive? No - Patient declined  Pre-existing out of facility DNR order (yellow form or pink MOST form) -      Chief Complaint  Patient presents with  . New Admit To SNF    New Admission Visit    HPI: Patient is a 85 y.o. male seen today for admission to SNF for therapy. Patient was admitted in the hospital from 02/07-02/11 for COPD exacerbation.  Patient has h/o adenocarcinoma of right lung status post right lower lobe resection in 2012,  bladder cancer status post transurethral resection, CAD, COPD, obstructive sleep apnea on CPAP ,CKD Stage 4 with Anemia, thrombo-cytopenia  Patient states he was not  feeling well for past few weeks.  He was seen by his PCP and was started on amoxicillin and was then changed to doxycycline.  But patient continued to get weaker. Was unable to get up . Was coughing and was short of breath.  He did not have any fever.  In the hospital his chest x-ray was negative for any infiltrate.  He was started on steroids and antibiotics.  He was resumed back on his Spiriva and was also started on Symbicort. Patient is in SNF now for therapy.  He denies any shortness of breath still has dry cough. He lives with his wife.  Walks with a walker at home.  His son lives close by who helps him.  He does not use any oxygen but is on CPAP at night. Patient did not have any acute complaints today. He denied any chest pain or fever Past Medical History:  Diagnosis Date  . AAA (abdominal aortic aneurysm) ([place]) 2004   s/p repair 2004; 4.3 cm infrarenal in 05/2011  . Abnormality of gait 02/23/2013  . Adenocarcinoma of right lung ([place]) 02/24/2011   Ct A/P 2012:  2cm lung mass RLL PET 4235:  Hypermetabolic RLL mass, no other hypermetabolic areas. TTNA 02/2011:  Adenocarcinoma, markers c/w lung origin Right lower lobe superior segmentectomy. 04/01/2011 Dr. Arlyce Dice   . Adenocarcinoma, lung ([facility]) 01/2011   transthoracic FNA; resection of the superior segment of the RLL in 03/2011; negative nodes; no chemotherapy  nor radiation planned  . Arm fracture    right arm  . Arteriosclerotic cardiovascular disease (ASCVD) 1973, 12/2010   S/P NSTEMI secondary to distal RCA/PL lesion, tx medically.  EF of  55%-60% per  echo.  . Benign prostatic hypertrophy    s/p transurethral resection of the prostate  . Bilateral renal masses    Cystic, more prominent on CT in 12/2010 than 2007; followed by Dr. Rosana Hoes  . Bladder cancer [facility]) 1996   Transurethral  resection of the bladder + chemotherapy/BCG as premed  . Cataract   . Chronic kidney disease    Creatinine 1.4 on discharge 12/20/2100; proteinuria; normal renal ultrasound in 2010; recent creatinines of 1.7-2.; Bilateral cystic renal masses by CT in 2011  . COPD (chronic obstructive pulmonary disease) ([place])   . Coronary artery disease   . Cough    thick phlegm  . Diabetes mellitus    Type II  . Diplopia 02/23/2013  . Diverticulosis   . Essential and other specified forms of tremor 02/23/2013  . Hx of Clostridium difficile infection   . Hyperlipidemia   . Hypertension   . Insomnia   . ITP (idiopathic thrombocytopenic purpura) 09/07/2012   Chronic ITP of adults versus medication-induced ITP.  Stable  . Myocardial infarction ([place])   . Nephrolithiasis 2012   ARF in 01/2011 due to obstructing nephrolithiasis  . Obesity   . OSA (obstructive sleep apnea)   . Polyneuropathy in diabetes(357.2) 02/23/2013  . Thrombocytopenia ([place])   . Tobacco abuse    50-pack-year consumption; quit in 12/2010  . Tubular adenoma of colon   . Ventral hernia    Past Surgical History:  Procedure Laterality Date  . ABDOMINAL AORTIC ANEURYSM REPAIR  2004  . CARDIAC CATHETERIZATION    . COLONOSCOPY  03/19/2010   Dr. Gala Romney -(poor prep) Anal papilla, rectal hyperplastic polyp, tubular adenoma removed splenic flexure, left-sided diverticula  . COLONOSCOPY  11/28/2004   RMR:  Diminutive rectal and left colon polyps as described above, cold  biopsied/removed/  Left sided diverticula. The remainder of the colonic mucosa appeared normal.  . COLONOSCOPY   09/15/01   RMR: Multiple diminutive polyps destroyed with dermolysis as described above/ Multiple small polyps on stalks in the colon resected with snare cautery/ Scattered pan colonic diverticulum/ The remainder of the colonic mucosa appeared normal  . COLONOSCOPY N/A 05/01/2015   Procedure: COLONOSCOPY;  Surgeon: Daneil Dolin, MD;  [facility];  Service:  Endoscopy;  Laterality: N/A;  1115  . CYSTECTOMY    . CYSTOSCOPY  04/2014  . CYSTOSTOMY W/ BLADDER BIOPSY    . FLEXIBLE SIGMOIDOSCOPY  2014   Dr. Olevia Perches: tubular adenoma, negative stool studies   . IR EXT NEPHROURETERAL CATH EXCHANGE  07/15/2018  . LUNG LOBECTOMY    . TONSILLECTOMY    . TRANSURETHRAL RESECTION OF PROSTATE    . VIDEO BRONCHOSCOPY WITH ENDOBRONCHIAL NAVIGATION N/A 10/10/2013   Procedure: VIDEO BRONCHOSCOPY WITH ENDOBRONCHIAL NAVIGATION;  Surgeon: Melrose Nakayama, MD;  Location: [facility];  Service: Thoracic;  Laterality: N/A;  NO BLOOD THINNERS BUT PATIENT HAS ITP  . WEDGE RESECTION  04/2011   carcinoma of lung    reports that he has quit smoking. His smoking use included cigarettes. He started smoking about 63 years ago. He has a 50.00 pack-year smoking history. He has never used smokeless tobacco. He reports that he does not drink alcohol or use drugs. Social History   Socioeconomic History  . Marital status: Married  Spouse name: [name]   . Number of children: 2  . Years of education: 12+  . Highest education level: Not on file  Occupational History  . Occupation: Retired     Fish farm manager: [employer]  Social Needs  . Financial resource strain: Not on file  . Food insecurity:    Worry: Not on file    Inability: Not on file  . Transportation needs:    Medical: Not on file    Non-medical: Not on file  Tobacco Use  . Smoking status: Former Smoker    Packs/day: 1.00    Years: 50.00    Pack years: 50.00    Types: Cigarettes    Start date: 07/22/1955  . Smokeless tobacco: Never Used  . Tobacco comment: "smokes a few days a week" 09/29/17  Substance and Sexual Activity  . Alcohol use: No    Alcohol/week: 0.0 standard drinks  . Drug use: No  . Sexual activity: Never  Lifestyle  . Physical activity:    Days per week: Not on file    Minutes per session: Not on file  . Stress: Not on file  Relationships  . Social connections:    Talks on phone: Not on file     Gets together: Not on file    Attends religious service: Not on file    Active member of club or organization: Not on file    Attends meetings of clubs or organizations: Not on file    Relationship status: Not on file  . Intimate partner violence:    Fear of current or ex partner: Not on file    Emotionally abused: Not on file    Physically abused: Not on file    Forced sexual activity: Not on file  Other Topics Concern  . Not on file  Social History Narrative   Patient lives at home with his wife [name].    Patient has 2 children.    Patient is retired.    Patient is right handed.    Patient has 2 years of college.    3-4 cups of caffeine daily.    Functional Status Survey:    Family History  Problem Relation Age of Onset  . Aortic aneurysm Mother   . Emphysema Father        smoker  . Clotting disorder Father   . Arthritis Father   . Hypertension Father   . Diabetes Father   . Aortic aneurysm Father   . Tremor Father   . Stroke Paternal Grandmother   . Other Paternal Grandfather        brain aneurysm  . Colon cancer Cousin     Health Maintenance  Topic Date Due  . URINE MICROALBUMIN  10/01/2018 (Originally 02/21/2017)  . OPHTHALMOLOGY EXAM  09/23/2018  . HEMOGLOBIN A1C  02/26/2019  . FOOT EXAM  05/08/2019  . COLONOSCOPY  04/30/2020  . TETANUS/TDAP  03/21/2026  . INFLUENZA VACCINE  Completed  . PNA vac Low Risk Adult  Completed    Allergies  Allergen Reactions  . Codeine Anaphylaxis  . Etodolac Other (See Comments)    dizziness  . Zanaflex [Tizanidine Hcl] Other (See Comments)    Drowsiness    Outpatient Encounter Medications as of 09/02/2018  Medication Sig  . Acetylcysteine (NAC PO) Take 1 capsule by mouth 2 (two) times daily.  Marland Kitchen albuterol (PROAIR HFA) 108 (90 Base) MCG/ACT inhaler INHALE 2 PUFFS INTO THE LUNGS 4 TIMES DAILY AS NEEDED.  Marland Kitchen ALPRAZolam (XANAX) 0.5  MG tablet Take 1 tablet (0.5 mg total) by mouth at bedtime.  Marland Kitchen aspirin EC 81 MG tablet Take  81 mg by mouth every morning.   . budesonide-formoterol (SYMBICORT) 160-4.5 MCG/ACT inhaler Inhale 2 puffs into the lungs 2 (two) times daily.  . cholecalciferol (VITAMIN D) 1000 units tablet Take 2,000 Units by mouth every morning.   . citalopram (CELEXA) 20 MG tablet TAKE ONE (1) TABLET BY MOUTH EVERY DAY  . clopidogrel (PLAVIX) 75 MG tablet Take 75 mg by mouth every morning.  . Cyanocobalamin (B-12) 2500 MCG TABS Take 1 tablet by mouth every morning.  Marland Kitchen doxycycline (VIBRAMYCIN) 100 MG capsule Take 100 mg by mouth 2 (two) times daily. Take for 3 days from 09/01/2018-09/03/2018  . fluticasone (FLONASE) 50 MCG/ACT nasal spray Place 1 spray into both nostrils daily.  Marland Kitchen glucosamine-chondroitin 500-400 MG tablet Take 1 tablet by mouth 2 (two) times daily.  Marland Kitchen ipratropium-albuterol (DUONEB) 0.5-2.5 (3) MG/3ML SOLN Take 3 mLs by nebulization every 4 (four) hours as needed.  . metoprolol tartrate (LOPRESSOR) 25 MG tablet Take 25 mg by mouth 2 (two) times daily.  . niacin (SLO-NIACIN) 500 MG tablet Take 1,000 mg by mouth at bedtime.  . nitroGLYCERIN (NITROSTAT) 0.4 MG SL tablet Place 1 tablet (0.4 mg total) under the tongue every 5 (five) minutes as needed. Call MD if need more than 2  . OLANZapine (ZYPREXA) 5 MG tablet Take 5 mg by mouth every evening.   . pravastatin (PRAVACHOL) 80 MG tablet TAKE ONE (1) TABLET EACH DAY  . predniSONE (DELTASONE) 20 MG tablet Take 3 tablets by mouth daily x1 day; then 2 tablets by mouth daily x2 days; then 1 tablet by mouth daily x2 days; then half tablet by mouth daily x3 days and stop prednisone.  . Probiotic Product (PROBIOTIC PEARLS ADVANTAGE) CAPS Take 1 capsule by mouth daily.  . sodium bicarbonate 650 MG tablet Take 1,300 mg by mouth 2 (two) times daily.  Marland Kitchen tiotropium (SPIRIVA HANDIHALER) 18 MCG inhalation capsule PLACE ONE CAPSULE INTO INHALER AND INHALE DAILY  . [DISCONTINUED] doxycycline (VIBRA-TABS) 100 MG tablet Take 1 tablet (100 mg total) by mouth 2 (two)  times daily for 4 days. (Patient taking differently: Take 100 mg by mouth 2 (two) times daily. Take for 3 days from 09/01/2018-09/03/2018)  . [DISCONTINUED] metoprolol tartrate (LOPRESSOR) 50 MG tablet TAKE ONE-HALF TABLET BY MOUTH TWICE DAILY  . [DISCONTINUED] Misc Natural Products (OSTEO BI-FLEX JOINT SHIELD) TABS Take 1 tablet by mouth 2 (two) times daily.  . [DISCONTINUED] mometasone (ELOCON) 0.1 % cream Apply 1 application topically daily. Affected area (Patient taking differently: Apply 1 application topically daily as needed (for irritation). Affected area)  . [DISCONTINUED] niacin (NIASPAN) 500 MG CR tablet Take 2 tablets (1,000 mg total) by mouth at bedtime. (Patient taking differently: Take 1,000 mg by mouth every evening. )  . [DISCONTINUED] OXYGEN Inhale 2.5 L into the lungs at bedtime. With CPAP  . [DISCONTINUED] SSD 1 % cream Apply 1 application topically daily.   . [DISCONTINUED] triamcinolone cream (KENALOG) 0.1 % Apply 1 application topically 2 (two) times daily.    No facility-administered encounter medications on file as of 09/02/2018.      Review of Systems  Constitutional: Positive for activity change.  HENT: Negative.   Respiratory: Positive for cough and shortness of breath.   Cardiovascular: Negative.   Gastrointestinal: Negative.   Genitourinary: Negative.   Musculoskeletal: Negative.   Skin: Negative.   Neurological: Positive for weakness.  Psychiatric/Behavioral:  Negative.     Vitals:   09/02/18 1647  BP: 136/70  Pulse: 63  Resp: 20  Temp: (!) 97 F (36.1 C)  Weight: 247 lb 9.6 oz (112.3 kg)   Body mass index is 33.58 kg/m. Physical Exam Vitals signs reviewed.  Constitutional:      Appearance: Normal appearance.  HENT:     Head: Normocephalic.     Nose: Nose normal.     Mouth/Throat:     Mouth: Mucous membranes are moist.     Pharynx: Oropharynx is clear.  Eyes:     Pupils: Pupils are equal, round, and reactive to light.  Neck:      Musculoskeletal: Neck supple.  Cardiovascular:     Rate and Rhythm: Normal rate and regular rhythm.     Pulses: Normal pulses.     Heart sounds: Normal heart sounds.  Pulmonary:     Effort: Pulmonary effort is normal.     Breath sounds: Normal breath sounds. No wheezing or rales.  Abdominal:     General: Abdomen is flat. Bowel sounds are normal. There is no distension.     Palpations: Abdomen is soft.  Musculoskeletal:     Comments: Trace edema Bilateral  Skin:    General: Skin is warm and dry.  Neurological:     General: No focal deficit present.     Mental Status: He is alert and oriented to person, place, and time.  Psychiatric:        Mood and Affect: Mood normal.        Thought Content: Thought content normal.        Judgment: Judgment normal.     Labs reviewed: Basic Metabolic Panel: Recent Labs    08/28/18 0335 08/30/18 0625 09/02/18 0700  NA 143 145 144  K 4.1 4.5 3.9  CL 112* 112* 112*  CO2 22 23 24   GLUCOSE 146* 185* 112*  BUN 42* 60* 70*  CREATININE 2.97* 3.22* 3.15*  CALCIUM 9.0 9.3 8.8*   Liver Function Tests: Recent Labs    06/20/18 1159 08/27/18 2045 08/28/18 0335  AST 9* 10* 9*  ALT 9 8 9   ALKPHOS 50 51 53  BILITOT 1.1 1.5* 1.7*  PROT 6.5 6.8 6.9  ALBUMIN 2.9* 3.3* 3.4*   No results for input(s): LIPASE, AMYLASE in the last 8760 hours. No results for input(s): AMMONIA in the last 8760 hours. CBC: Recent Labs    06/20/18 1159 08/27/18 2045 08/28/18 0335 08/30/18 0625 09/02/18 0700  WBC 5.9 9.0 9.3 10.6* 6.4  NEUTROABS 3.9 6.9  --   --  5.0  HGB 9.6* 8.7* 9.3* 8.6* 9.3*  HCT 33.4* 29.7* 31.4* 29.2* 31.6*  MCV 99.4 102.8* 102.6* 104.7* 103.6*  PLT 84* 91* 84* 101* 82*   Cardiac Enzymes: No results for input(s): CKTOTAL, CKMB, CKMBINDEX, TROPONINI in the last 8760 hours. BNP: Invalid input(s): POCBNP Lab Results  Component Value Date   HGBA1C 5.2 08/28/2018   Lab Results  Component Value Date   TSH 3.506 01/31/2013   Lab  Results  Component Value Date   VITAMINB12 926 (H) 10/20/2016   Lab Results  Component Value Date   FOLATE 7.3 03/23/2016   Lab Results  Component Value Date   IRON 80 12/29/2016   TIBC 281 12/29/2016   FERRITIN 192 12/29/2016    Imaging and Procedures obtained prior to SNF admission: No results found.  Assessment/Plan COPD exacerbation  He is on Prednisone Taper. On Doxycycline Oxygen Taper Also oN  spiriva and Symbicort Also On Oxygen Will taper. He is not on Home O2 Finishing Antibiotics  Benign essential HTN BP Stable on Metorpolol  Obstructive sleep apnea on CPAP  Stage 4 chronic kidney disease  Follows with nephrology in [facility] On Sodium Bicarb for his Acidosis Has Worsening of his BUN most likely due to dehydration Talked to Dietician and Nurses to push more fluids Repeat Bmp in few days   Thrombocytopenia  Chronic H/O Bladder Cancer Follows with Oncology. On CT scan Surveillance CAD On Aspirin, Beta Blocker and statin Anemia Chronic   Family/ staff Communication:   Labs/tests ordered:  Total time spent in this patient care encounter was 45_ minutes; greater than 50% of the visit spent counseling patient, reviewing records , Labs and coordinating care for problems addressed at this encounter.

## 2018-09-03 ENCOUNTER — Telehealth: Payer: Self-pay | Admitting: Oncology

## 2018-09-03 ENCOUNTER — Inpatient Hospital Stay
Admission: RE | Admit: 2018-09-03 | Discharge: 2018-09-09 | Disposition: A | Discharge status: Skilled Nursing Facility | Payer: Medicare Other | Source: Ambulatory Visit | Attending: Internal Medicine | Admitting: Internal Medicine

## 2018-09-03 ENCOUNTER — Encounter (HOSPITAL_COMMUNITY): Payer: Self-pay | Admitting: Emergency Medicine

## 2018-09-03 ENCOUNTER — Emergency Department (HOSPITAL_COMMUNITY)
Admission: EM | Admit: 2018-09-03 | Discharge: 2018-09-03 | Disposition: A | Discharge status: Home / Self Care | Payer: Medicare Other | Attending: Emergency Medicine | Admitting: Emergency Medicine

## 2018-09-03 ENCOUNTER — Other Ambulatory Visit: Payer: Self-pay

## 2018-09-03 DIAGNOSIS — Z936 Other artificial openings of urinary tract status: Secondary | ICD-10-CM | POA: Insufficient documentation

## 2018-09-03 DIAGNOSIS — Z8551 Personal history of malignant neoplasm of bladder: Secondary | ICD-10-CM | POA: Diagnosis not present

## 2018-09-03 DIAGNOSIS — Z7982 Long term (current) use of aspirin: Secondary | ICD-10-CM | POA: Insufficient documentation

## 2018-09-03 DIAGNOSIS — N184 Chronic kidney disease, stage 4 (severe): Secondary | ICD-10-CM | POA: Diagnosis not present

## 2018-09-03 DIAGNOSIS — Z87891 Personal history of nicotine dependence: Secondary | ICD-10-CM | POA: Insufficient documentation

## 2018-09-03 DIAGNOSIS — I129 Hypertensive chronic kidney disease with stage 1 through stage 4 chronic kidney disease, or unspecified chronic kidney disease: Secondary | ICD-10-CM | POA: Insufficient documentation

## 2018-09-03 DIAGNOSIS — E1142 Type 2 diabetes mellitus with diabetic polyneuropathy: Secondary | ICD-10-CM | POA: Insufficient documentation

## 2018-09-03 DIAGNOSIS — Z7902 Long term (current) use of antithrombotics/antiplatelets: Secondary | ICD-10-CM | POA: Insufficient documentation

## 2018-09-03 DIAGNOSIS — I2510 Atherosclerotic heart disease of native coronary artery without angina pectoris: Secondary | ICD-10-CM | POA: Insufficient documentation

## 2018-09-03 DIAGNOSIS — J441 Chronic obstructive pulmonary disease with (acute) exacerbation: Secondary | ICD-10-CM | POA: Insufficient documentation

## 2018-09-03 DIAGNOSIS — Z79899 Other long term (current) drug therapy: Secondary | ICD-10-CM | POA: Insufficient documentation

## 2018-09-03 DIAGNOSIS — E1122 Type 2 diabetes mellitus with diabetic chronic kidney disease: Secondary | ICD-10-CM | POA: Insufficient documentation

## 2018-09-03 DIAGNOSIS — N3001 Acute cystitis with hematuria: Secondary | ICD-10-CM | POA: Diagnosis not present

## 2018-09-03 LAB — URINALYSIS, ROUTINE W REFLEX MICROSCOPIC
Bilirubin Urine: NEGATIVE
Glucose, UA: >=500 mg/dL — AB
Ketones, ur: NEGATIVE mg/dL
Nitrite: NEGATIVE
PH: 6 (ref 5.0–8.0)
Protein, ur: 100 mg/dL — AB
RBC / HPF: >50 RBC/hpf — ABNORMAL HIGH (ref 0–5)
Specific Gravity, Urine: 1.012 (ref 1.005–1.030)

## 2018-09-03 MED ORDER — CIPROFLOXACIN HCL 500 MG PO TABS: 500.0000 mg | ORAL_TABLET | Freq: Two times a day (BID) | ORAL | 0 refills | Status: DC

## 2018-09-03 MED ORDER — IPRATROPIUM-ALBUTEROL 0.5-2.5 (3) MG/3ML IN SOLN
3.0000 mL | Freq: Once | RESPIRATORY_TRACT | Status: AC
  Administered 2018-09-03: 3 mL via RESPIRATORY_TRACT
  Filled 2018-09-03: qty 3

## 2018-09-03 MED ORDER — CIPROFLOXACIN HCL 250 MG PO TABS
500.0000 mg | ORAL_TABLET | Freq: Once | ORAL | Status: AC
  Administered 2018-09-03: 500 mg via ORAL
  Filled 2018-09-03: qty 2

## 2018-09-03 NOTE — Discharge Instructions (Signed)
Follow-up with your urologist this week.

## 2018-09-03 NOTE — Telephone Encounter (Signed)
Per patient, rescheduled 02/19 to 03/03. Spoke with son. Confirmed new date and time

## 2018-09-03 NOTE — ED Triage Notes (Signed)
Patient from [facility] with complaint of blood in urine starting today. Denies pain or dysuria.

## 2018-09-03 NOTE — ED Notes (Signed)
Called Rhonda RN at the [facility] and gave report. States she will send someone to pick up patient.

## 2018-09-03 NOTE — ED Provider Notes (Signed)
[facility] EMERGENCY DEPARTMENT Provider Note   CSN: [id number] Arrival date & time: 09/03/18  1904     History   Chief Complaint Chief Complaint  Patient presents with  . Hematuria    HPI Kyle [name] is a 85 y.o. male.  Patient started with hematuria today.  Patient has a urostomy tube on the right and has history of bladder cancer.  The history is provided by the patient. No language interpreter was used.  Hematuria  This is a new problem. The current episode started 12 to 24 hours ago. The problem occurs constantly. The problem has not changed since onset.Pertinent negatives include no chest pain, no abdominal pain and no headaches. Nothing aggravates the symptoms. Nothing relieves the symptoms. He has tried nothing for the symptoms.    Past Medical History:  Diagnosis Date  . AAA (abdominal aortic aneurysm) ([place]) 2004   s/p repair 2004; 4.3 cm infrarenal in 05/2011  . Abnormality of gait 02/23/2013  . Adenocarcinoma of right lung ([place]) 02/24/2011   Ct A/P 2012:  2cm lung mass RLL PET 7408:  Hypermetabolic RLL mass, no other hypermetabolic areas. TTNA 02/2011:  Adenocarcinoma, markers c/w lung origin Right lower lobe superior segmentectomy. 04/01/2011 Dr. Arlyce Dice   . Adenocarcinoma, lung ([place]) 01/2011   transthoracic FNA; resection of the superior segment of the RLL in 03/2011; negative nodes; no chemotherapy nor radiation planned  . Arm fracture    right arm  . Arteriosclerotic cardiovascular disease (ASCVD) 1973, 12/2010   S/P NSTEMI secondary to distal RCA/PL lesion, tx medically.  EF of  55%-60% per  echo.  . Benign prostatic hypertrophy    s/p transurethral resection of the prostate  . Bilateral renal masses    Cystic, more prominent on CT in 12/2010 than 2007; followed by Dr. Rosana Hoes  . Bladder cancer [facility]) 1996   Transurethral resection of the bladder + chemotherapy/BCG as premed  . Cataract   . Chronic kidney disease    Creatinine 1.4 on discharge 12/20/2100;  proteinuria; normal renal ultrasound in 2010; recent creatinines of 1.7-2.; Bilateral cystic renal masses by CT in 2011  . COPD (chronic obstructive pulmonary disease) ([place])   . Coronary artery disease   . Cough    thick phlegm  . Diabetes mellitus    Type II  . Diplopia 02/23/2013  . Diverticulosis   . Essential and other specified forms of tremor 02/23/2013  . Hx of Clostridium difficile infection   . Hyperlipidemia   . Hypertension   . Insomnia   . ITP (idiopathic thrombocytopenic purpura) 09/07/2012   Chronic ITP of adults versus medication-induced ITP.  Stable  . Myocardial infarction ([place])   . Nephrolithiasis 2012   ARF in 01/2011 due to obstructing nephrolithiasis  . Obesity   . OSA (obstructive sleep apnea)   . Polyneuropathy in diabetes(357.2) 02/23/2013  . Thrombocytopenia ([place])   . Tobacco abuse    50-pack-year consumption; quit in 12/2010  . Tubular adenoma of colon   . Ventral hernia     Patient Active Problem List   Diagnosis Date Noted  . Hypoxia   . Gastroesophageal reflux disease   . Benign essential HTN   . Stage 4 chronic kidney disease ([place]) 06/30/2016  . Subdural hematoma, post-traumatic ([place]) 03/21/2016  . Subdural hematoma ([place]) 03/21/2016  . Malignant neoplasm of trigone of urinary bladder ([place]) 02/12/2016  . Bilirubinemia 12/12/2015  . Thrombocytopenia ([place]) 11/13/2015  . Cough 10/03/2015  . Allergic rhinitis 10/03/2015  . Secondary hyperparathyroidism (  [place]) 09/06/2015  . Tremor, essential 08/27/2015  . History of colonic polyps   . Diverticulosis of colon without hemorrhage   . Malignant neoplasm of lateral wall of urinary bladder ([place]) 01/12/2014  . CIS (carcinoma in situ of bladder) 08/22/2013  . Static tremor 02/23/2013  . Diplopia 02/23/2013  . Diabetic polyneuropathy ([place]) 02/23/2013  . Abnormality of gait 02/23/2013  . COPD exacerbation ([place]) 01/31/2013  . Fracture of right distal radius 12/14/2012  . Diabetes mellitus ([place]) 11/30/2012  .  Idiopathic thrombocytopenic purpura ([place]) 09/07/2012  . Depression 07/28/2012  . Benign prostate hyperplasia 06/10/2012  . LLQ pain 11/13/2011  . Hernia of anterior abdominal wall 10/28/2011  . AAA (abdominal aortic aneurysm) ([place])   . Tobacco abuse   . Diarrhea 10/22/2011  . Tubular adenoma of colon   . Bladder neck obstruction 06/05/2011  . Obstructive sleep apnea on CPAP 03/27/2011  . Fasting hyperglycemia 03/27/2011  . Recurrent nephrolithiasis 03/27/2011  . Carcinoma of bladder ([place]) 03/27/2011  . COPD with acute bronchitis ([place]) 02/24/2011  . Adenocarcinoma of right lung ([place]) 02/24/2011  . CAD (coronary artery disease) 01/06/2011    Past Surgical History:  Procedure Laterality Date  . ABDOMINAL AORTIC ANEURYSM REPAIR  2004  . CARDIAC CATHETERIZATION    . COLONOSCOPY  03/19/2010   Dr. Gala Romney -(poor prep) Anal papilla, rectal hyperplastic polyp, tubular adenoma removed splenic flexure, left-sided diverticula  . COLONOSCOPY  11/28/2004   RMR:  Diminutive rectal and left colon polyps as described above, cold  biopsied/removed/  Left sided diverticula. The remainder of the colonic mucosa appeared normal.  . COLONOSCOPY   09/15/01   RMR: Multiple diminutive polyps destroyed with dermolysis as described above/ Multiple small polyps on stalks in the colon resected with snare cautery/ Scattered pan colonic diverticulum/ The remainder of the colonic mucosa appeared normal  . COLONOSCOPY N/A 05/01/2015   Procedure: COLONOSCOPY;  Surgeon: Daneil Dolin, MD;  Location: [facility]ITE;  Service: Endoscopy;  Laterality: N/A;  1115  . CYSTECTOMY    . CYSTOSCOPY  04/2014  . CYSTOSTOMY W/ BLADDER BIOPSY    . FLEXIBLE SIGMOIDOSCOPY  2014   Dr. Olevia Perches: tubular adenoma, negative stool studies   . IR EXT NEPHROURETERAL CATH EXCHANGE  07/15/2018  . LUNG LOBECTOMY    . TONSILLECTOMY    . TRANSURETHRAL RESECTION OF PROSTATE    . VIDEO BRONCHOSCOPY WITH ENDOBRONCHIAL NAVIGATION N/A 10/10/2013    Procedure: VIDEO BRONCHOSCOPY WITH ENDOBRONCHIAL NAVIGATION;  Surgeon: Melrose Nakayama, MD;  Location: [place];  Service: Thoracic;  Laterality: N/A;  NO BLOOD THINNERS BUT PATIENT HAS ITP  . WEDGE RESECTION  04/2011   carcinoma of lung        Home Medications    Prior to Admission medications   Medication Sig Start Date End Date Taking? Authorizing Provider  Acetylcysteine (NAC PO) Take 1 capsule by mouth 2 (two) times daily.    [provider]  albuterol (PROAIR HFA) 108 (90 Base) MCG/ACT inhaler INHALE 2 PUFFS INTO THE LUNGS 4 TIMES DAILY AS NEEDED. 07/28/18   Juanito Doom, MD  ALPRAZolam Duanne Moron) 0.5 MG tablet Take 1 tablet (0.5 mg total) by mouth at bedtime. 09/01/18   Wille Celeste, PA-C  aspirin EC 81 MG tablet Take 81 mg by mouth every morning.     [provider]  budesonide-formoterol (SYMBICORT) 160-4.5 MCG/ACT inhaler Inhale 2 puffs into the lungs 2 (two) times daily. 08/31/18   Barton Dubois, MD  cholecalciferol (VITAMIN D)  1000 units tablet Take 2,000 Units by mouth every morning.     [provider]  ciprofloxacin (CIPRO) 500 MG tablet Take 1 tablet (500 mg total) by mouth 2 (two) times daily. One po bid x 7 days 09/03/18   Milton Ferguson, MD  citalopram (CELEXA) 20 MG tablet TAKE ONE (1) TABLET BY MOUTH EVERY DAY 08/11/18   Mikey Kirschner, MD  clopidogrel (PLAVIX) 75 MG tablet Take 75 mg by mouth every morning.    [provider]  Cyanocobalamin (B-12) 2500 MCG TABS Take 1 tablet by mouth every morning.    [provider]  doxycycline (VIBRAMYCIN) 100 MG capsule Take 100 mg by mouth 2 (two) times daily. Take for 3 days from 09/01/2018-09/03/2018    [provider]  fluticasone (FLONASE) 50 MCG/ACT nasal spray Place 1 spray into both nostrils daily. 11/09/17   Mikey Kirschner, MD  glucosamine-chondroitin 500-400 MG tablet Take 1 tablet by mouth 2 (two) times daily.    [provider]  ipratropium-albuterol  (DUONEB) 0.5-2.5 (3) MG/3ML SOLN Take 3 mLs by nebulization every 4 (four) hours as needed. 06/20/18   Kinnie Feil, PA-C  metoprolol tartrate (LOPRESSOR) 25 MG tablet Take 25 mg by mouth 2 (two) times daily.    [provider]  niacin (SLO-NIACIN) 500 MG tablet Take 1,000 mg by mouth at bedtime.    [provider]  nitroGLYCERIN (NITROSTAT) 0.4 MG SL tablet Place 1 tablet (0.4 mg total) under the tongue every 5 (five) minutes as needed. Call MD if need more than 2 04/18/16   Mikey Kirschner, MD  OLANZapine (ZYPREXA) 5 MG tablet Take 5 mg by mouth every evening.     [provider]  pravastatin (PRAVACHOL) 80 MG tablet TAKE ONE (1) TABLET EACH DAY 08/11/18   Mikey Kirschner, MD  predniSONE (DELTASONE) 20 MG tablet Take 3 tablets by mouth daily x1 day; then 2 tablets by mouth daily x2 days; then 1 tablet by mouth daily x2 days; then half tablet by mouth daily x3 days and stop prednisone. 08/31/18   Barton Dubois, MD  Probiotic Product (PROBIOTIC PEARLS ADVANTAGE) CAPS Take 1 capsule by mouth daily.    [provider]  sodium bicarbonate 650 MG tablet Take 1,300 mg by mouth 2 (two) times daily.    [provider]  tiotropium (SPIRIVA HANDIHALER) 18 MCG inhalation capsule PLACE ONE CAPSULE INTO INHALER AND INHALE DAILY 07/23/18   Kathyrn Drown, MD    Family History Family History  Problem Relation Age of Onset  . Aortic aneurysm Mother   . Emphysema Father        smoker  . Clotting disorder Father   . Arthritis Father   . Hypertension Father   . Diabetes Father   . Aortic aneurysm Father   . Tremor Father   . Stroke Paternal Grandmother   . Other Paternal Grandfather        brain aneurysm  . Colon cancer Cousin     Social History Social History   Tobacco Use  . Smoking status: Former Smoker    Packs/day: 1.00    Years: 50.00    Pack years: 50.00    Types: Cigarettes    Start date: 07/22/1955  . Smokeless tobacco: Never Used  .  Tobacco comment: "smokes a few days a week" 09/29/17  Substance Use Topics  . Alcohol use: No    Alcohol/week: 0.0 standard drinks  . Drug use: No  Allergies   Codeine; Etodolac; and Zanaflex [tizanidine hcl]   Review of Systems Review of Systems  Constitutional: Negative for appetite change and fatigue.  HENT: Negative for congestion, ear discharge and sinus pressure.   Eyes: Negative for discharge.  Respiratory: Negative for cough.   Cardiovascular: Negative for chest pain.  Gastrointestinal: Negative for abdominal pain and diarrhea.  Genitourinary: Positive for hematuria. Negative for frequency.  Musculoskeletal: Negative for back pain.  Skin: Negative for rash.  Neurological: Negative for seizures and headaches.  Psychiatric/Behavioral: Negative for hallucinations.  Patient started with   Physical Exam Updated Vital Signs BP (!) 142/77 (BP Location: Right Arm)   Pulse 72   Temp (!) 97.5 F (36.4 C) (Oral)   Resp 16   Ht 6' (1.829 m)   Wt 112 kg   SpO2 99%   BMI 33.50 kg/m   Physical Exam Vitals signs and nursing note reviewed.  Constitutional:      Appearance: He is well-developed.  HENT:     Head: Normocephalic.     Nose: Nose normal.  Eyes:     General: No scleral icterus.    Conjunctiva/sclera: Conjunctivae normal.  Neck:     Musculoskeletal: Neck supple.     Thyroid: No thyromegaly.  Cardiovascular:     Rate and Rhythm: Normal rate and regular rhythm.     Heart sounds: No murmur. No friction rub. No gallop.   Pulmonary:     Breath sounds: No stridor. No wheezing or rales.  Chest:     Chest wall: No tenderness.  Abdominal:     General: There is no distension.     Tenderness: There is no abdominal tenderness. There is no rebound.  Genitourinary:    Comments: Urostomy tube right flank Musculoskeletal: Normal range of motion.  Lymphadenopathy:     Cervical: No cervical adenopathy.  Skin:    Findings: No erythema or rash.  Neurological:      Mental Status: He is oriented to person, place, and time.     Motor: No abnormal muscle tone.     Coordination: Coordination normal.  Psychiatric:        Behavior: Behavior normal.      ED Treatments / Results  Labs (all labs ordered are listed, but only abnormal results are displayed) Labs Reviewed  URINALYSIS, ROUTINE W REFLEX MICROSCOPIC - Abnormal; Notable for the following components:      Result Value   APPearance HAZY (*)    Glucose, UA >=500 (*)    Hgb urine dipstick LARGE (*)    Protein, ur 100 (*)    Leukocytes,Ua TRACE (*)    RBC / HPF >50 (*)    Bacteria, UA RARE (*)    All other components within normal limits  URINE CULTURE    EKG None  Radiology No results found.  Procedures Procedures (including critical care time)  Medications Ordered in ED Medications  ciprofloxacin (CIPRO) tablet 500 mg (has no administration in time range)  ipratropium-albuterol (DUONEB) 0.5-2.5 (3) MG/3ML nebulizer solution 3 mL (3 mLs Nebulization Given 09/03/18 2006)     Initial Impression / Assessment and Plan / ED Course  I have reviewed the triage vital signs and the nursing notes.  Pertinent labs & imaging results that were available during my care of the patient were reviewed by me and considered in my medical decision making (see chart for details).     Patient with hematuria.  We will culture his urine and place him on Cipro.  He is to follow-up with urology next week  Final Clinical Impressions(s) / ED Diagnoses   Final diagnoses:  Acute cystitis with hematuria    ED Discharge Orders         Ordered    ciprofloxacin (CIPRO) 500 MG tablet  2 times daily     09/03/18 2101           Milton Ferguson, MD 09/03/18 2103

## 2018-09-05 LAB — URINE CULTURE: Culture: <10000 — AB

## 2018-09-06 ENCOUNTER — Encounter: Payer: Self-pay | Admitting: Internal Medicine

## 2018-09-06 ENCOUNTER — Encounter: Payer: Self-pay | Admitting: Adult Health

## 2018-09-06 ENCOUNTER — Other Ambulatory Visit (HOSPITAL_COMMUNITY)
Admission: RE | Admit: 2018-09-06 | Discharge: 2018-09-06 | Disposition: A | Discharge status: Home / Self Care | Payer: Medicare Other | Source: Skilled Nursing Facility | Attending: Internal Medicine | Admitting: Internal Medicine

## 2018-09-06 ENCOUNTER — Non-Acute Institutional Stay (SKILLED_NURSING_FACILITY): Payer: Medicare Other | Admitting: Internal Medicine

## 2018-09-06 DIAGNOSIS — G4733 Obstructive sleep apnea (adult) (pediatric): Secondary | ICD-10-CM

## 2018-09-06 DIAGNOSIS — J441 Chronic obstructive pulmonary disease with (acute) exacerbation: Secondary | ICD-10-CM

## 2018-09-06 DIAGNOSIS — Z9989 Dependence on other enabling machines and devices: Secondary | ICD-10-CM | POA: Diagnosis not present

## 2018-09-06 DIAGNOSIS — R310 Gross hematuria: Secondary | ICD-10-CM

## 2018-09-06 DIAGNOSIS — N184 Chronic kidney disease, stage 4 (severe): Secondary | ICD-10-CM | POA: Diagnosis not present

## 2018-09-06 LAB — CBC WITH DIFFERENTIAL/PLATELET
Abs Immature Granulocytes: 0.1 10*3/uL — ABNORMAL HIGH (ref 0.00–0.07)
Basophils Absolute: 0 10*3/uL (ref 0.0–0.1)
Basophils Relative: 0 %
Eosinophils Absolute: 0 10*3/uL (ref 0.0–0.5)
Eosinophils Relative: 1 %
HCT: 29.8 % — ABNORMAL LOW (ref 39.0–52.0)
Hemoglobin: 8.6 g/dL — ABNORMAL LOW (ref 13.0–17.0)
IMMATURE GRANULOCYTES: 2 %
LYMPHS PCT: 16 %
Lymphs Abs: 0.9 10*3/uL (ref 0.7–4.0)
MCH: 29.7 pg (ref 26.0–34.0)
MCHC: 28.9 g/dL — ABNORMAL LOW (ref 30.0–36.0)
MCV: 102.8 fL — ABNORMAL HIGH (ref 80.0–100.0)
Monocytes Absolute: 0.4 10*3/uL (ref 0.1–1.0)
Monocytes Relative: 7 %
Neutro Abs: 4.4 10*3/uL (ref 1.7–7.7)
Neutrophils Relative %: 74 %
Platelets: 65 10*3/uL — ABNORMAL LOW (ref 150–400)
RBC: 2.9 MIL/uL — ABNORMAL LOW (ref 4.22–5.81)
RDW: 17.6 % — ABNORMAL HIGH (ref 11.5–15.5)
WBC: 5.9 10*3/uL (ref 4.0–10.5)
nRBC: 0 % (ref 0.0–0.2)

## 2018-09-06 LAB — BRAIN NATRIURETIC PEPTIDE: B NATRIURETIC PEPTIDE 5: 104 pg/mL — AB (ref 0.0–100.0)

## 2018-09-06 NOTE — Progress Notes (Signed)
Location:  [facility] Room Number: [place] of Service:  SNF [phone]) Provider:  Veleta Miners, MD  Kyle Kirschner, MD  Patient Care Team: Kyle Kirschner, MD as PCP - General (Family Medicine) Kyle Broker, MD (Urology) Kyle Che, MD (Inactive) (Ophthalmology) Caprice Beaver, DPM (Podiatry) Kyle Monarch, MD (Dermatology) System, Provider Not In Kyle Dolin, MD (Gastroenterology) Melrose Nakayama, MD as Consulting Physician (Cardiothoracic Surgery) Clance, Armando Reichert, MD as Consulting Physician (Pulmonary Disease) Kyle Commons, MD as Attending Physician (Cardiology)  Extended Emergency Contact Information Primary Emergency Contact: Kyle [name] Address: [address]          [place], [place] [address] [place] Phone: [phone] Mobile Phone: [phone] Relation: Spouse Secondary Emergency Contact: Kyle,[name] Address: [address]           [place], [place] [address] Kyle [name] of [place] Phone: [phone] Mobile Phone: [phone] Relation: Son  Code Status:  DNR Goals of care: Advanced Directive information Advanced Directives 09/06/2018  Does Patient Have a Medical Advance Directive? Yes  Type of Advance Directive Out of facility DNR (pink MOST or yellow form)  Does patient want to make changes to medical advance directive? No - Patient declined  Copy of [place] in Chart? -  Would patient like information on creating a medical advance directive? No - Patient declined  Pre-existing out of facility DNR order (yellow form or pink MOST form) -     Chief Complaint  Patient presents with  . Acute Visit    E/D follow up    HPI:  Pt is a 85 y.o. male seen today for an acute visit for Follow up from ED. Patient was send to the ED for Hematuria on 02/14 He has been in SNF for therapy after he was admitted in the hospital from 02/07-02/11 for COPD  exacerbation.  Patient has h/o adenocarcinoma of right lung status post right lower lobe resection in 2012, bladder cancer status post transurethral resection, CAD, COPD, obstructive sleep apnea on CPAP ,CKD Stage 4 with Anemia, thrombo-cytopenia h/o Nephrostomy tube gets changed every Month by Radiology.  Patient was admitted for coughing and  short of breath.  He did not have any fever.  In the hospital his chest x-ray was negative for any infiltrate.  He was started on steroids and antibiotics.  He was resumed back on his Spiriva and was also started on Symbicort. Patient is in SNF now for therapy.  He denies any shortness of breath still has dry cough. He has been in SNF for therapy. He was send to the hospital when nursing noticed he had gross Hematuria. In the ED he was started on Cipro and Send back  To SNF for  follow with his Urology. Since been back he has not had any more hematuria.And he is feeling fine. His SOB is better. Walking with the Walker few Steps. He lives with his wife.  Walks with a walker at home.  His son lives close by who helps him.  He does not use any oxygen but is on CPAP at night.  Past Medical History:  Diagnosis Date  . AAA (abdominal aortic aneurysm) ([place]) 2004   s/p repair 2004; 4.3 cm infrarenal in 05/2011  . Abnormality of gait 02/23/2013  . Adenocarcinoma of right lung ([facility]) 02/24/2011   Ct A/P 2012:  2cm lung mass RLL PET 9702:  Hypermetabolic RLL mass, no  other hypermetabolic areas. TTNA 02/2011:  Adenocarcinoma, markers c/w lung origin Right lower lobe superior segmentectomy. 04/01/2011 Dr. Arlyce Dice   . Adenocarcinoma, lung ([facility]) 01/2011   transthoracic FNA; resection of the superior segment of the RLL in 03/2011; negative nodes; no chemotherapy nor radiation planned  . Arm fracture    right arm  . Arteriosclerotic cardiovascular disease (ASCVD) 1973, 12/2010   S/P NSTEMI secondary to distal RCA/PL lesion, tx medically.  EF of  55%-60% per  echo.  . Benign  prostatic hypertrophy    s/p transurethral resection of the prostate  . Bilateral renal masses    Cystic, more prominent on CT in 12/2010 than 2007; followed by Dr. Rosana Hoes  . Bladder cancer [facility]) 1996   Transurethral resection of the bladder + chemotherapy/BCG as premed  . Cataract   . Chronic kidney disease    Creatinine 1.4 on discharge 12/20/2100; proteinuria; normal renal ultrasound in 2010; recent creatinines of 1.7-2.; Bilateral cystic renal masses by CT in 2011  . COPD (chronic obstructive pulmonary disease) ([facility])   . Coronary artery disease   . Cough    thick phlegm  . Diabetes mellitus    Type II  . Diplopia 02/23/2013  . Diverticulosis   . Essential and other specified forms of tremor 02/23/2013  . Hx of Clostridium difficile infection   . Hyperlipidemia   . Hypertension   . Insomnia   . ITP (idiopathic thrombocytopenic purpura) 09/07/2012   Chronic ITP of adults versus medication-induced ITP.  Stable  . Myocardial infarction ([facility])   . Nephrolithiasis 2012   ARF in 01/2011 due to obstructing nephrolithiasis  . Obesity   . OSA (obstructive sleep apnea)   . Polyneuropathy in diabetes(357.2) 02/23/2013  . Thrombocytopenia ([facility])   . Tobacco abuse    50-pack-year consumption; quit in 12/2010  . Tubular adenoma of colon   . Ventral hernia    Past Surgical History:  Procedure Laterality Date  . ABDOMINAL AORTIC ANEURYSM REPAIR  2004  . CARDIAC CATHETERIZATION    . COLONOSCOPY  03/19/2010   Dr. Gala Romney -(poor prep) Anal papilla, rectal hyperplastic polyp, tubular adenoma removed splenic flexure, left-sided diverticula  . COLONOSCOPY  11/28/2004   RMR:  Diminutive rectal and left colon polyps as described above, cold  biopsied/removed/  Left sided diverticula. The remainder of the colonic mucosa appeared normal.  . COLONOSCOPY   09/15/01   RMR: Multiple diminutive polyps destroyed with dermolysis as described above/ Multiple small polyps on stalks in the colon resected with snare  cautery/ Scattered pan colonic diverticulum/ The remainder of the colonic mucosa appeared normal  . COLONOSCOPY N/A 05/01/2015   Procedure: COLONOSCOPY;  Surgeon: Kyle Dolin, MD;  [facility];  Service: Endoscopy;  Laterality: N/A;  1115  . CYSTECTOMY    . CYSTOSCOPY  04/2014  . CYSTOSTOMY W/ BLADDER BIOPSY    . FLEXIBLE SIGMOIDOSCOPY  2014   Dr. Olevia Perches: tubular adenoma, negative stool studies   . IR EXT NEPHROURETERAL CATH EXCHANGE  07/15/2018  . LUNG LOBECTOMY    . TONSILLECTOMY    . TRANSURETHRAL RESECTION OF PROSTATE    . VIDEO BRONCHOSCOPY WITH ENDOBRONCHIAL NAVIGATION N/A 10/10/2013   Procedure: VIDEO BRONCHOSCOPY WITH ENDOBRONCHIAL NAVIGATION;  Surgeon: [place] Nakayama, MD;  Location: [facility];  Service: Thoracic;  Laterality: N/A;  NO BLOOD THINNERS BUT PATIENT HAS ITP  . WEDGE RESECTION  04/2011   carcinoma of lung    Allergies  Allergen Reactions  . Codeine Anaphylaxis  .  Etodolac Other (See Comments)    dizziness  . Zanaflex [Tizanidine Hcl] Other (See Comments)    Drowsiness    Outpatient Encounter Medications as of 09/06/2018  Medication Sig  . Acetylcysteine (NAC PO) Take 1 capsule by mouth 2 (two) times daily. 600 mg  . albuterol (PROAIR HFA) 108 (90 Base) MCG/ACT inhaler INHALE 2 PUFFS INTO THE LUNGS 4 TIMES DAILY AS NEEDED.  Marland Kitchen ALPRAZolam (XANAX) 0.5 MG tablet Take 1 tablet (0.5 mg total) by mouth at bedtime.  Marland Kitchen aspirin EC 81 MG tablet Take 81 mg by mouth every morning.   . budesonide-formoterol (SYMBICORT) 160-4.5 MCG/ACT inhaler Inhale 2 puffs into the lungs 2 (two) times daily.  . cholecalciferol (VITAMIN D) 1000 units tablet Take 2,000 Units by mouth every morning.   . ciprofloxacin (CIPRO) 500 MG tablet Take 1 tablet (500 mg total) by mouth 2 (two) times daily. One po bid x 7 days  . citalopram (CELEXA) 20 MG tablet TAKE ONE (1) TABLET BY MOUTH EVERY DAY  . clopidogrel (PLAVIX) 75 MG tablet Take 75 mg by mouth every morning.  . Cyanocobalamin  (B-12) 2500 MCG TABS Take 1 tablet by mouth every morning.  . fluticasone (FLONASE) 50 MCG/ACT nasal spray Place 1 spray into both nostrils daily.  Marland Kitchen glucosamine-chondroitin 500-400 MG tablet Take 1 tablet by mouth 2 (two) times daily.  Marland Kitchen ipratropium-albuterol (DUONEB) 0.5-2.5 (3) MG/3ML SOLN Take 3 mLs by nebulization every 4 (four) hours as needed.  . magnesium hydroxide (MILK OF MAGNESIA) 400 MG/5ML suspension Take 30 mLs by mouth daily as needed for mild constipation.  . metoprolol tartrate (LOPRESSOR) 25 MG tablet Take 25 mg by mouth 2 (two) times daily.  . niacin (SLO-NIACIN) 500 MG tablet Take 1,000 mg by mouth at bedtime.  . nitroGLYCERIN (NITROSTAT) 0.4 MG SL tablet Place 1 tablet (0.4 mg total) under the tongue every 5 (five) minutes as needed. Call MD if need more than 2  . NON FORMULARY CPAP while sleeping with Oxygen bleed in at @@ 2.5 L/min  . NON FORMULARY Diet Type:  [name], consistent Carbohydrate  . OLANZapine (ZYPREXA) 5 MG tablet Take 5 mg by mouth every evening.   . OXYGEN Inhale into the lungs. Taper Oxygen to maintain Stat above 90  . pravastatin (PRAVACHOL) 80 MG tablet TAKE ONE (1) TABLET EACH DAY  . predniSONE (DELTASONE) 10 MG tablet Take 20 mg by mouth daily with breakfast.  . Probiotic Product (PROBIOTIC PEARLS ADVANTAGE) CAPS Take 1 capsule by mouth daily.  . sodium bicarbonate 650 MG tablet Take 1,300 mg by mouth 2 (two) times daily.  Marland Kitchen tiotropium (SPIRIVA HANDIHALER) 18 MCG inhalation capsule PLACE ONE CAPSULE INTO INHALER AND INHALE DAILY   No facility-administered encounter medications on file as of 09/06/2018.     Review of Systems  Constitutional: Positive for activity change.  HENT: Negative.   Respiratory: Negative for cough and shortness of breath.   Cardiovascular: Negative.   Gastrointestinal: Negative.   Genitourinary: Negative.   Musculoskeletal: Negative.   Skin: Negative.   Neurological: Positive for weakness.  Psychiatric/Behavioral: Negative.      Immunization History  Administered Date(s) Administered  . DT 06/10/2013  . Influenza Split 05/21/2012  . Influenza Whole 03/22/2011  . Influenza, Seasonal, Injecte, Preservative Fre 04/18/2016  . Influenza,inj,Quad PF,6+ Mos 05/15/2014, 05/22/2015, 05/04/2017, 04/02/2018  . Influenza-Unspecified 05/04/2013  . Pneumococcal Conjugate-13 05/15/2014  . Pneumococcal-Unspecified 11/16/2007  . Tdap 03/21/2016  . Zoster 11/06/2008   Pertinent  Health Maintenance Due  Topic Date Due  . URINE MICROALBUMIN  10/01/2018 (Originally 02/21/2017)  . OPHTHALMOLOGY EXAM  09/23/2018  . HEMOGLOBIN A1C  02/26/2019  . FOOT EXAM  05/08/2019  . COLONOSCOPY  04/30/2020  . INFLUENZA VACCINE  Completed  . PNA vac Low Risk Adult  Completed   Fall Risk  12/03/2016 05/27/2016 04/22/2016 04/16/2016 04/02/2016  Falls in the past year? (No Data) Yes - Yes Yes  Comment no falls last 3 weeks per patient report 2 more falls since last visit; no injury (slipped on rolling chair, bent over into car) - - -  Number falls in past yr: - 2 or more - 1 1  Injury with Fall? - - - No No  Risk Factor Category  - High Fall Risk - - -  Risk for fall due to : - History of fall(s);Impaired balance/gait;Impaired mobility - History of fall(s);Impaired balance/gait;Impaired mobility Medication side effect  Risk for fall due to: Comment - - - - -  Follow up - Education provided;Falls prevention discussed Falls prevention discussed;Education provided Falls prevention discussed Falls prevention discussed   Functional Status Survey:    Vitals:   09/06/18 1343  BP: 124/70  Pulse: 60  Resp: 14  Temp: (!) 97.4 F (36.3 C)  SpO2: 94%  Weight: 246 lb (111.6 kg)  Height: 6' (1.829 m)   Body mass index is 33.36 kg/m. Physical Exam Vitals signs reviewed.  Constitutional:      Appearance: Normal appearance.  HENT:     Head: Normocephalic.     Nose: Nose normal.     Mouth/Throat:     Mouth: Mucous membranes are moist.      Pharynx: Oropharynx is clear.  Eyes:     Pupils: Pupils are equal, round, and reactive to light.  Neck:     Musculoskeletal: Neck supple.  Cardiovascular:     Rate and Rhythm: Normal rate and regular rhythm.     Pulses: Normal pulses.     Heart sounds: Normal heart sounds.  Pulmonary:     Effort: Pulmonary effort is normal.     Breath sounds: Normal breath sounds. No wheezing or rales.  Abdominal:     General: Abdomen is flat. Bowel sounds are normal. There is no distension.     Palpations: Abdomen is soft.  Musculoskeletal:     Comments: Trace edema Bilateral  Skin:    General: Skin is warm and dry.  Neurological:     General: No focal deficit present.     Mental Status: He is alert and oriented to person, place, and time.  Psychiatric:        Mood and Affect: Mood normal.        Thought Content: Thought content normal.        Judgment: Judgment normal.     Labs reviewed: Recent Labs    08/28/18 0335 08/30/18 0625 09/02/18 0700  NA 143 145 144  K 4.1 4.5 3.9  CL 112* 112* 112*  CO2 22 23 24   GLUCOSE 146* 185* 112*  BUN 42* 60* 70*  CREATININE 2.97* 3.22* 3.15*  CALCIUM 9.0 9.3 8.8*   Recent Labs    06/20/18 1159 08/27/18 2045 08/28/18 0335  AST 9* 10* 9*  ALT 9 8 9   ALKPHOS 50 51 53  BILITOT 1.1 1.5* 1.7*  PROT 6.5 6.8 6.9  ALBUMIN 2.9* 3.3* 3.4*   Recent Labs    08/27/18 2045  08/30/18 0625 09/02/18 0700 09/06/18 0928  WBC 9.0   < >  10.6* 6.4 5.9  NEUTROABS 6.9  --   --  5.0 4.4  HGB 8.7*   < > 8.6* 9.3* 8.6*  HCT 29.7*   < > 29.2* 31.6* 29.8*  MCV 102.8*   < > 104.7* 103.6* 102.8*  PLT 91*   < > 101* 82* 65*   < > = values in this interval not displayed.   Lab Results  Component Value Date   TSH 3.506 01/31/2013   Lab Results  Component Value Date   HGBA1C 5.2 08/28/2018   Lab Results  Component Value Date   CHOL 119 11/09/2017   HDL 37 (L) 11/09/2017   LDLCALC 61 11/09/2017   TRIG 106 11/09/2017   CHOLHDL 3.2 11/09/2017     Significant Diagnostic Results in last 30 days:  Dg Chest 2 View  Result Date: 08/27/2018 CLINICAL DATA:  Shortness of breath with exertion. History of bladder and lung cancer. EXAM: CHEST - 2 VIEW COMPARISON:  Chest radiograph June 20, 2018 FINDINGS: Cardiac silhouette is mildly enlarged. Tortuous, but possibly ectatic calcified aorta. Chronic interstitial changes with strandy densities RIGHT lung base. Similar small pleural effusion versus pleural thickening posteriorly. No pneumothorax. Soft tissue planes and included osseous structures are unchanged. Osteopenia. IMPRESSION: 1. Stable cardiomegaly. 2. Chronic interstitial changes/COPD with RIGHT lung base atelectasis. Small posterior pleural effusion versus pleural thickening. 3.  Aortic Atherosclerosis (ICD10-I70.0). Electronically Signed   By: Elon Alas M.D.   On: 08/27/2018 21:17    Assessment/Plan Episode of Gross hematuria Resolved now. D/W the Wife she says he gets that sometimes and per urology it will happen episodically. He is on CT scan surveillance for Bladder cancer They want to follow with Urology as outpatient unless it happens agin then they will see him early. Will discontinue Cipro as his Urine is negative for any infection. Has Nephrostomy tube gets changed by Radiology as out patient   COPD exacerbation  He is on Prednisone Taper. Finished Doxycycline Oxygen Taper Also oN spiriva and Symbicort Also On Oxygen Will taper. He is not on Home O2 Benign essential HTN BP Stable on Metorpolol  Obstructive sleep apnea on CPAP Stage 4 chronic kidney disease  Follows with nephrology in [facility] On Sodium Bicarb for his Acidosis Has Worsening of his BUN most likely due to dehydration Talked to Dietician and Nurses to push more fluids Repeat BMP Pending   Thrombocytopenia  Chronic H/O Bladder Cancer Follows with Oncology. On CT scan Surveillance CAD On Aspirin, Beta Blocker and  statin Anemia Chronic Did have Low Hgb since Hematuria Will repeat CBC in few days     Family/ staff Communication: D/W the Wife  Labs/tests ordered:   Total time spent in this patient care encounter was 25_ minutes; greater than 50% of the visit spent counseling patient, reviewing records , Labs and coordinating care for problems addressed at this encounter.

## 2018-09-06 NOTE — Progress Notes (Deleted)
Location:  [facility] Room Number: Mission of Service:  SNF [phone]) Provider:  Veleta Miners, MD  Mikey Kirschner, MD  Patient Care Team: Mikey Kirschner, MD as PCP - General (Family Medicine) Myrlene Broker, MD (Urology) Williams Che, MD (Inactive) (Ophthalmology) Caprice Beaver, DPM (Podiatry) Lavonna Monarch, MD (Dermatology) System, Provider Not In Daneil Dolin, MD (Gastroenterology) Melrose Nakayama, MD as Consulting Physician (Cardiothoracic Surgery) Clance, Armando Reichert, MD as Consulting Physician (Pulmonary Disease) Herminio Commons, MD as Attending Physician (Cardiology)  Extended Emergency Contact Information Primary Emergency Contact: [name] Address: [address]          [place], [place] [address] [place] Phone: [phone] Mobile Phone: [phone] Relation: Spouse Secondary Emergency Contact: Gadsden,[name] Address: [address]           [place], [place] [address] [name] of [place] Phone: [phone] Mobile Phone: [phone] Relation: Son  Code Status:  DNR Goals of care: Advanced Directive information Advanced Directives 09/06/2018  Does Patient Have a Medical Advance Directive? Yes  Type of Advance Directive Out of facility DNR (pink MOST or yellow form)  Does patient want to make changes to medical advance directive? No - Patient declined  Copy of [place] in Chart? -  Would patient like information on creating a medical advance directive? No - Patient declined  Pre-existing out of facility DNR order (yellow form or pink MOST form) -     Chief Complaint  Patient presents with  . Acute Visit    E/D follow up    HPI:  Pt is a 85 y.o. male seen today for an acute visit for    Past Medical History:  Diagnosis Date  . AAA (abdominal aortic aneurysm) ([facility]) 2004   s/p repair 2004; 4.3 cm infrarenal in 05/2011  . Abnormality of gait 02/23/2013    . Adenocarcinoma of right lung ([place]) 02/24/2011   Ct A/P 2012:  2cm lung mass RLL PET 2683:  Hypermetabolic RLL mass, no other hypermetabolic areas. TTNA 02/2011:  Adenocarcinoma, markers c/w lung origin Right lower lobe superior segmentectomy. 04/01/2011 Dr. Arlyce Dice   . Adenocarcinoma, lung ([facility]) 01/2011   transthoracic FNA; resection of the superior segment of the RLL in 03/2011; negative nodes; no chemotherapy nor radiation planned  . Arm fracture    right arm  . Arteriosclerotic cardiovascular disease (ASCVD) 1973, 12/2010   S/P NSTEMI secondary to distal RCA/PL lesion, tx medically.  EF of  55%-60% per  echo.  . Benign prostatic hypertrophy    s/p transurethral resection of the prostate  . Bilateral renal masses    Cystic, more prominent on CT in 12/2010 than 2007; followed by Dr. Rosana Hoes  . Bladder cancer [facility]) 1996   Transurethral resection of the bladder + chemotherapy/BCG as premed  . Cataract   . Chronic kidney disease    Creatinine 1.4 on discharge 12/20/2100; proteinuria; normal renal ultrasound in 2010; recent creatinines of 1.7-2.; Bilateral cystic renal masses by CT in 2011  . COPD (chronic obstructive pulmonary disease) ([place])   . Coronary artery disease   . Cough    thick phlegm  . Diabetes mellitus    Type II  . Diplopia 02/23/2013  . Diverticulosis   . Essential and other specified forms of tremor 02/23/2013  . Hx of Clostridium difficile infection   . Hyperlipidemia   . Hypertension   . Insomnia   .  ITP (idiopathic thrombocytopenic purpura) 09/07/2012   Chronic ITP of adults versus medication-induced ITP.  Stable  . Myocardial infarction ([facility])   . Nephrolithiasis 2012   ARF in 01/2011 due to obstructing nephrolithiasis  . Obesity   . OSA (obstructive sleep apnea)   . Polyneuropathy in diabetes(357.2) 02/23/2013  . Thrombocytopenia ([place])   . Tobacco abuse    50-pack-year consumption; quit in 12/2010  . Tubular adenoma of colon   . Ventral hernia    Past Surgical History:   Procedure Laterality Date  . ABDOMINAL AORTIC ANEURYSM REPAIR  2004  . CARDIAC CATHETERIZATION    . COLONOSCOPY  03/19/2010   Dr. Gala Romney -(poor prep) Anal papilla, rectal hyperplastic polyp, tubular adenoma removed splenic flexure, left-sided diverticula  . COLONOSCOPY  11/28/2004   RMR:  Diminutive rectal and left colon polyps as described above, cold  biopsied/removed/  Left sided diverticula. The remainder of the colonic mucosa appeared normal.  . COLONOSCOPY   09/15/01   RMR: Multiple diminutive polyps destroyed with dermolysis as described above/ Multiple small polyps on stalks in the colon resected with snare cautery/ Scattered pan colonic diverticulum/ The remainder of the colonic mucosa appeared normal  . COLONOSCOPY N/A 05/01/2015   Procedure: COLONOSCOPY;  Surgeon: Daneil Dolin, MD;  [facility];  Service: Endoscopy;  Laterality: N/A;  1115  . CYSTECTOMY    . CYSTOSCOPY  04/2014  . CYSTOSTOMY W/ BLADDER BIOPSY    . FLEXIBLE SIGMOIDOSCOPY  2014   Dr. Olevia Perches: tubular adenoma, negative stool studies   . IR EXT NEPHROURETERAL CATH EXCHANGE  07/15/2018  . LUNG LOBECTOMY    . TONSILLECTOMY    . TRANSURETHRAL RESECTION OF PROSTATE    . VIDEO BRONCHOSCOPY WITH ENDOBRONCHIAL NAVIGATION N/A 10/10/2013   Procedure: VIDEO BRONCHOSCOPY WITH ENDOBRONCHIAL NAVIGATION;  Surgeon: Melrose Nakayama, MD;  Location: [facility];  Service: Thoracic;  Laterality: N/A;  NO BLOOD THINNERS BUT PATIENT HAS ITP  . WEDGE RESECTION  04/2011   carcinoma of lung    Allergies  Allergen Reactions  . Codeine Anaphylaxis  . Etodolac Other (See Comments)    dizziness  . Zanaflex [Tizanidine Hcl] Other (See Comments)    Drowsiness    Outpatient Encounter Medications as of 09/06/2018  Medication Sig  . Acetylcysteine (NAC PO) Take 1 capsule by mouth 2 (two) times daily. 600 mg  . albuterol (PROAIR HFA) 108 (90 Base) MCG/ACT inhaler INHALE 2 PUFFS INTO THE LUNGS 4 TIMES DAILY AS NEEDED.  Marland Kitchen  ALPRAZolam (XANAX) 0.5 MG tablet Take 1 tablet (0.5 mg total) by mouth at bedtime.  Marland Kitchen aspirin EC 81 MG tablet Take 81 mg by mouth every morning.   . budesonide-formoterol (SYMBICORT) 160-4.5 MCG/ACT inhaler Inhale 2 puffs into the lungs 2 (two) times daily.  . cholecalciferol (VITAMIN D) 1000 units tablet Take 2,000 Units by mouth every morning.   . ciprofloxacin (CIPRO) 500 MG tablet Take 1 tablet (500 mg total) by mouth 2 (two) times daily. One po bid x 7 days  . citalopram (CELEXA) 20 MG tablet TAKE ONE (1) TABLET BY MOUTH EVERY DAY  . clopidogrel (PLAVIX) 75 MG tablet Take 75 mg by mouth every morning.  . Cyanocobalamin (B-12) 2500 MCG TABS Take 1 tablet by mouth every morning.  . fluticasone (FLONASE) 50 MCG/ACT nasal spray Place 1 spray into both nostrils daily.  Marland Kitchen glucosamine-chondroitin 500-400 MG tablet Take 1 tablet by mouth 2 (two) times daily.  Marland Kitchen ipratropium-albuterol (DUONEB) 0.5-2.5 (3) MG/3ML SOLN Take  3 mLs by nebulization every 4 (four) hours as needed.  . magnesium hydroxide (MILK OF MAGNESIA) 400 MG/5ML suspension Take 30 mLs by mouth daily as needed for mild constipation.  . metoprolol tartrate (LOPRESSOR) 25 MG tablet Take 25 mg by mouth 2 (two) times daily.  . niacin (SLO-NIACIN) 500 MG tablet Take 1,000 mg by mouth at bedtime.  . nitroGLYCERIN (NITROSTAT) 0.4 MG SL tablet Place 1 tablet (0.4 mg total) under the tongue every 5 (five) minutes as needed. Call MD if need more than 2  . NON FORMULARY CPAP while sleeping with Oxygen bleed in at @@ 2.5 L/min  . NON FORMULARY Diet Type:  [name], consistent Carbohydrate  . OLANZapine (ZYPREXA) 5 MG tablet Take 5 mg by mouth every evening.   . OXYGEN Inhale into the lungs. Taper Oxygen to maintain Stat above 90  . pravastatin (PRAVACHOL) 80 MG tablet TAKE ONE (1) TABLET EACH DAY  . predniSONE (DELTASONE) 10 MG tablet Take 20 mg by mouth daily with breakfast.  . Probiotic Product (PROBIOTIC PEARLS ADVANTAGE) CAPS Take 1 capsule by mouth  daily.  . sodium bicarbonate 650 MG tablet Take 1,300 mg by mouth 2 (two) times daily.  Marland Kitchen tiotropium (SPIRIVA HANDIHALER) 18 MCG inhalation capsule PLACE ONE CAPSULE INTO INHALER AND INHALE DAILY  . [DISCONTINUED] doxycycline (VIBRAMYCIN) 100 MG capsule Take 100 mg by mouth 2 (two) times daily. Take for 3 days from 09/01/2018-09/03/2018  . [DISCONTINUED] predniSONE (DELTASONE) 20 MG tablet Take 3 tablets by mouth daily x1 day; then 2 tablets by mouth daily x2 days; then 1 tablet by mouth daily x2 days; then half tablet by mouth daily x3 days and stop prednisone. (Patient not taking: Reported on 09/06/2018)   No facility-administered encounter medications on file as of 09/06/2018.     Review of Systems  Immunization History  Administered Date(s) Administered  . DT 06/10/2013  . Influenza Split 05/21/2012  . Influenza Whole 03/22/2011  . Influenza, Seasonal, Injecte, Preservative Fre 04/18/2016  . Influenza,inj,Quad PF,6+ Mos 05/15/2014, 05/22/2015, 05/04/2017, 04/02/2018  . Influenza-Unspecified 05/04/2013  . Pneumococcal Conjugate-13 05/15/2014  . Pneumococcal-Unspecified 11/16/2007  . Tdap 03/21/2016  . Zoster 11/06/2008   Pertinent  Health Maintenance Due  Topic Date Due  . URINE MICROALBUMIN  10/01/2018 (Originally 02/21/2017)  . OPHTHALMOLOGY EXAM  09/23/2018  . HEMOGLOBIN A1C  02/26/2019  . FOOT EXAM  05/08/2019  . COLONOSCOPY  04/30/2020  . INFLUENZA VACCINE  Completed  . PNA vac Low Risk Adult  Completed   Fall Risk  12/03/2016 05/27/2016 04/22/2016 04/16/2016 04/02/2016  Falls in the past year? (No Data) Yes - Yes Yes  Comment no falls last 3 weeks per patient report 2 more falls since last visit; no injury (slipped on rolling chair, bent over into car) - - -  Number falls in past yr: - 2 or more - 1 1  Injury with Fall? - - - No No  Risk Factor Category  - High Fall Risk - - -  Risk for fall due to : - History of fall(s);Impaired balance/gait;Impaired mobility - History of  fall(s);Impaired balance/gait;Impaired mobility Medication side effect  Risk for fall due to: Comment - - - - -  Follow up - Education provided;Falls prevention discussed Falls prevention discussed;Education provided Falls prevention discussed Falls prevention discussed   Functional Status Survey:    Vitals:   09/06/18 1039  BP: 124/70  Pulse: 60  Resp: 14  Temp: (!) 97.4 F (36.3 C)  SpO2: 94%  Weight:  246 lb (111.6 kg)  Height: 6' (1.829 m)   Body mass index is 33.36 kg/m. Physical Exam  Labs reviewed: Recent Labs    08/28/18 0335 08/30/18 0625 09/02/18 0700  NA 143 145 144  K 4.1 4.5 3.9  CL 112* 112* 112*  CO2 22 23 24   GLUCOSE 146* 185* 112*  BUN 42* 60* 70*  CREATININE 2.97* 3.22* 3.15*  CALCIUM 9.0 9.3 8.8*   Recent Labs    06/20/18 1159 08/27/18 2045 08/28/18 0335  AST 9* 10* 9*  ALT 9 8 9   ALKPHOS 50 51 53  BILITOT 1.1 1.5* 1.7*  PROT 6.5 6.8 6.9  ALBUMIN 2.9* 3.3* 3.4*   Recent Labs    08/27/18 2045  08/30/18 0625 09/02/18 0700 09/06/18 0928  WBC 9.0   < > 10.6* 6.4 5.9  NEUTROABS 6.9  --   --  5.0 4.4  HGB 8.7*   < > 8.6* 9.3* 8.6*  HCT 29.7*   < > 29.2* 31.6* 29.8*  MCV 102.8*   < > 104.7* 103.6* 102.8*  PLT 91*   < > 101* 82* 65*   < > = values in this interval not displayed.   Lab Results  Component Value Date   TSH 3.506 01/31/2013   Lab Results  Component Value Date   HGBA1C 5.2 08/28/2018   Lab Results  Component Value Date   CHOL 119 11/09/2017   HDL 37 (L) 11/09/2017   LDLCALC 61 11/09/2017   TRIG 106 11/09/2017   CHOLHDL 3.2 11/09/2017    Significant Diagnostic Results in last 30 days:  Dg Chest 2 View  Result Date: 08/27/2018 CLINICAL DATA:  Shortness of breath with exertion. History of bladder and lung cancer. EXAM: CHEST - 2 VIEW COMPARISON:  Chest radiograph June 20, 2018 FINDINGS: Cardiac silhouette is mildly enlarged. Tortuous, but possibly ectatic calcified aorta. Chronic interstitial changes with strandy  densities RIGHT lung base. Similar small pleural effusion versus pleural thickening posteriorly. No pneumothorax. Soft tissue planes and included osseous structures are unchanged. Osteopenia. IMPRESSION: 1. Stable cardiomegaly. 2. Chronic interstitial changes/COPD with RIGHT lung base atelectasis. Small posterior pleural effusion versus pleural thickening. 3.  Aortic Atherosclerosis (ICD10-I70.0). Electronically Signed   By: Elon Alas M.D.   On: 08/27/2018 21:17    Assessment/Plan There are no diagnoses linked to this encounter.   Family/ staff Communication: ***  Labs/tests ordered:  ***

## 2018-09-06 NOTE — Progress Notes (Signed)
Entered in error

## 2018-09-07 ENCOUNTER — Encounter (HOSPITAL_COMMUNITY)
Admission: RE | Admit: 2018-09-07 | Discharge: 2018-09-07 | Disposition: A | Discharge status: Home / Self Care | Payer: Medicare Other | Source: Skilled Nursing Facility | Attending: *Deleted | Admitting: *Deleted

## 2018-09-07 ENCOUNTER — Ambulatory Visit (HOSPITAL_COMMUNITY): Payer: Medicare Other

## 2018-09-07 DIAGNOSIS — Z8551 Personal history of malignant neoplasm of bladder: Secondary | ICD-10-CM

## 2018-09-07 DIAGNOSIS — Z7982 Long term (current) use of aspirin: Secondary | ICD-10-CM | POA: Diagnosis not present

## 2018-09-07 DIAGNOSIS — Z833 Family history of diabetes mellitus: Secondary | ICD-10-CM

## 2018-09-07 DIAGNOSIS — R410 Disorientation, unspecified: Secondary | ICD-10-CM | POA: Diagnosis present

## 2018-09-07 DIAGNOSIS — E875 Hyperkalemia: Secondary | ICD-10-CM | POA: Diagnosis not present

## 2018-09-07 DIAGNOSIS — N400 Enlarged prostate without lower urinary tract symptoms: Secondary | ICD-10-CM | POA: Diagnosis present

## 2018-09-07 DIAGNOSIS — I129 Hypertensive chronic kidney disease with stage 1 through stage 4 chronic kidney disease, or unspecified chronic kidney disease: Secondary | ICD-10-CM | POA: Diagnosis present

## 2018-09-07 DIAGNOSIS — Z66 Do not resuscitate: Secondary | ICD-10-CM | POA: Diagnosis present

## 2018-09-07 DIAGNOSIS — A4189 Other specified sepsis: Principal | ICD-10-CM | POA: Diagnosis present

## 2018-09-07 DIAGNOSIS — K219 Gastro-esophageal reflux disease without esophagitis: Secondary | ICD-10-CM | POA: Diagnosis present

## 2018-09-07 DIAGNOSIS — Z7902 Long term (current) use of antithrombotics/antiplatelets: Secondary | ICD-10-CM | POA: Diagnosis not present

## 2018-09-07 DIAGNOSIS — J101 Influenza due to other identified influenza virus with other respiratory manifestations: Secondary | ICD-10-CM | POA: Diagnosis present

## 2018-09-07 DIAGNOSIS — Z9981 Dependence on supplemental oxygen: Secondary | ICD-10-CM

## 2018-09-07 DIAGNOSIS — I252 Old myocardial infarction: Secondary | ICD-10-CM | POA: Diagnosis not present

## 2018-09-07 DIAGNOSIS — R000 Tachycardia, unspecified: Secondary | ICD-10-CM | POA: Diagnosis not present

## 2018-09-07 DIAGNOSIS — F419 Anxiety disorder, unspecified: Secondary | ICD-10-CM | POA: Diagnosis present

## 2018-09-07 DIAGNOSIS — I2510 Atherosclerotic heart disease of native coronary artery without angina pectoris: Secondary | ICD-10-CM | POA: Diagnosis present

## 2018-09-07 DIAGNOSIS — J441 Chronic obstructive pulmonary disease with (acute) exacerbation: Secondary | ICD-10-CM | POA: Diagnosis present

## 2018-09-07 DIAGNOSIS — E1142 Type 2 diabetes mellitus with diabetic polyneuropathy: Secondary | ICD-10-CM | POA: Diagnosis present

## 2018-09-07 DIAGNOSIS — Z7984 Long term (current) use of oral hypoglycemic drugs: Secondary | ICD-10-CM | POA: Diagnosis not present

## 2018-09-07 DIAGNOSIS — Z87891 Personal history of nicotine dependence: Secondary | ICD-10-CM | POA: Diagnosis not present

## 2018-09-07 DIAGNOSIS — J440 Chronic obstructive pulmonary disease with acute lower respiratory infection: Secondary | ICD-10-CM | POA: Diagnosis present

## 2018-09-07 DIAGNOSIS — Z7951 Long term (current) use of inhaled steroids: Secondary | ICD-10-CM | POA: Diagnosis not present

## 2018-09-07 DIAGNOSIS — A419 Sepsis, unspecified organism: Secondary | ICD-10-CM | POA: Diagnosis not present

## 2018-09-07 DIAGNOSIS — E785 Hyperlipidemia, unspecified: Secondary | ICD-10-CM | POA: Diagnosis not present

## 2018-09-07 DIAGNOSIS — G4733 Obstructive sleep apnea (adult) (pediatric): Secondary | ICD-10-CM | POA: Diagnosis not present

## 2018-09-07 DIAGNOSIS — Z79899 Other long term (current) drug therapy: Secondary | ICD-10-CM | POA: Diagnosis not present

## 2018-09-07 DIAGNOSIS — N184 Chronic kidney disease, stage 4 (severe): Secondary | ICD-10-CM | POA: Diagnosis not present

## 2018-09-07 DIAGNOSIS — Z800 Family history of malignant neoplasm of digestive organs: Secondary | ICD-10-CM

## 2018-09-07 DIAGNOSIS — D693 Immune thrombocytopenic purpura: Secondary | ICD-10-CM | POA: Diagnosis not present

## 2018-09-07 DIAGNOSIS — J9601 Acute respiratory failure with hypoxia: Secondary | ICD-10-CM | POA: Diagnosis not present

## 2018-09-07 DIAGNOSIS — Z85118 Personal history of other malignant neoplasm of bronchus and lung: Secondary | ICD-10-CM

## 2018-09-07 DIAGNOSIS — J90 Pleural effusion, not elsewhere classified: Secondary | ICD-10-CM | POA: Diagnosis not present

## 2018-09-07 DIAGNOSIS — Z9221 Personal history of antineoplastic chemotherapy: Secondary | ICD-10-CM

## 2018-09-07 DIAGNOSIS — R0602 Shortness of breath: Secondary | ICD-10-CM | POA: Diagnosis not present

## 2018-09-07 DIAGNOSIS — Z9079 Acquired absence of other genital organ(s): Secondary | ICD-10-CM

## 2018-09-07 DIAGNOSIS — J189 Pneumonia, unspecified organism: Secondary | ICD-10-CM | POA: Diagnosis not present

## 2018-09-07 DIAGNOSIS — E1122 Type 2 diabetes mellitus with diabetic chronic kidney disease: Secondary | ICD-10-CM | POA: Diagnosis not present

## 2018-09-07 DIAGNOSIS — Z515 Encounter for palliative care: Secondary | ICD-10-CM | POA: Diagnosis present

## 2018-09-07 DIAGNOSIS — F329 Major depressive disorder, single episode, unspecified: Secondary | ICD-10-CM | POA: Diagnosis present

## 2018-09-07 LAB — BASIC METABOLIC PANEL
Anion gap: 8 (ref 5–15)
BUN: 58 mg/dL — ABNORMAL HIGH (ref 8–23)
CO2: 24 mmol/L (ref 22–32)
Calcium: 8.8 mg/dL — ABNORMAL LOW (ref 8.9–10.3)
Chloride: 112 mmol/L — ABNORMAL HIGH (ref 98–111)
Creatinine, Ser: 2.84 mg/dL — ABNORMAL HIGH (ref 0.61–1.24)
GFR calc Af Amer: 23 mL/min — ABNORMAL LOW (ref >60)
GFR calc non Af Amer: 20 mL/min — ABNORMAL LOW (ref >60)
Glucose, Bld: 98 mg/dL (ref 70–99)
Potassium: 4.4 mmol/L (ref 3.5–5.1)
Sodium: 144 mmol/L (ref 135–145)

## 2018-09-08 ENCOUNTER — Inpatient Hospital Stay: Payer: Medicare Other | Admitting: Oncology

## 2018-09-08 ENCOUNTER — Inpatient Hospital Stay: Payer: Medicare Other

## 2018-09-08 ENCOUNTER — Non-Acute Institutional Stay (SKILLED_NURSING_FACILITY): Payer: Medicare Other | Admitting: Adult Health

## 2018-09-08 ENCOUNTER — Encounter: Payer: Self-pay | Admitting: Adult Health

## 2018-09-08 DIAGNOSIS — J189 Pneumonia, unspecified organism: Secondary | ICD-10-CM

## 2018-09-08 NOTE — Progress Notes (Signed)
Location:   [facility] Room Number: 151 P Place of Service:  SNF (31)   CODE STATUS: DNR  Allergies  Allergen Reactions  . Codeine Anaphylaxis  . Etodolac Other (See Comments)    dizziness  . Zanaflex [Tizanidine Hcl] Other (See Comments)    Drowsiness    Chief Complaint  Patient presents with  . Acute Visit    Wheezing    HPI:  Staff report that he has had wheezing for the past couple of days. There are no reports of fevers; does have a cough without sputum. There are no reports of changes in appetite; no uncontrolled pain.    Past Medical History:  Diagnosis Date  . AAA (abdominal aortic aneurysm) ([place]) 2004   s/p repair 2004; 4.3 cm infrarenal in 05/2011  . Abnormality of gait 02/23/2013  . Adenocarcinoma of right lung ([place]) 02/24/2011   Ct A/P 2012:  2cm lung mass RLL PET 6378:  Hypermetabolic RLL mass, no other hypermetabolic areas. TTNA 02/2011:  Adenocarcinoma, markers c/w lung origin Right lower lobe superior segmentectomy. 04/01/2011 Dr. Arlyce Dice   . Adenocarcinoma, lung ([place]) 01/2011   transthoracic FNA; resection of the superior segment of the RLL in 03/2011; negative nodes; no chemotherapy nor radiation planned  . Arm fracture    right arm  . Arteriosclerotic cardiovascular disease (ASCVD) 1973, 12/2010   S/P NSTEMI secondary to distal RCA/PL lesion, tx medically.  EF of  55%-60% per  echo.  . Benign prostatic hypertrophy    s/p transurethral resection of the prostate  . Bilateral renal masses    Cystic, more prominent on CT in 12/2010 than 2007; followed by Dr. Rosana Hoes  . Bladder cancer [facility]) 1996   Transurethral resection of the bladder + chemotherapy/BCG as premed  . Cataract   . Chronic kidney disease    Creatinine 1.4 on discharge 12/20/2100; proteinuria; normal renal ultrasound in 2010; recent creatinines of 1.7-2.; Bilateral cystic renal masses by CT in 2011  . COPD (chronic obstructive pulmonary disease) ([place])   . Coronary artery  disease   . Cough    thick phlegm  . Diabetes mellitus    Type II  . Diplopia 02/23/2013  . Diverticulosis   . Essential and other specified forms of tremor 02/23/2013  . Hx of Clostridium difficile infection   . Hyperlipidemia   . Hypertension   . Insomnia   . ITP (idiopathic thrombocytopenic purpura) 09/07/2012   Chronic ITP of adults versus medication-induced ITP.  Stable  . Myocardial infarction ([place])   . Nephrolithiasis 2012   ARF in 01/2011 due to obstructing nephrolithiasis  . Obesity   . OSA (obstructive sleep apnea)   . Polyneuropathy in diabetes(357.2) 02/23/2013  . Thrombocytopenia ([place])   . Tobacco abuse    50-pack-year consumption; quit in 12/2010  . Tubular adenoma of colon   . Ventral hernia     Past Surgical History:  Procedure Laterality Date  . ABDOMINAL AORTIC ANEURYSM REPAIR  2004  . CARDIAC CATHETERIZATION    . COLONOSCOPY  03/19/2010   Dr. Gala Romney -(poor prep) Anal papilla, rectal hyperplastic polyp, tubular adenoma removed splenic flexure, left-sided diverticula  . COLONOSCOPY  11/28/2004   RMR:  Diminutive rectal and left colon polyps as described above, cold  biopsied/removed/  Left sided diverticula. The remainder of the colonic mucosa appeared normal.  . COLONOSCOPY   09/15/01   RMR: Multiple diminutive polyps destroyed with dermolysis as described above/ Multiple small polyps on stalks in the  colon resected with snare cautery/ Scattered pan colonic diverticulum/ The remainder of the colonic mucosa appeared normal  . COLONOSCOPY N/A 05/01/2015   Procedure: COLONOSCOPY;  Surgeon: Daneil Dolin, MD;  [facility];  Service: Endoscopy;  Laterality: N/A;  1115  . CYSTECTOMY    . CYSTOSCOPY  04/2014  . CYSTOSTOMY W/ BLADDER BIOPSY    . FLEXIBLE SIGMOIDOSCOPY  2014   Dr. Olevia Perches: tubular adenoma, negative stool studies   . IR EXT NEPHROURETERAL CATH EXCHANGE  07/15/2018  . LUNG LOBECTOMY    . TONSILLECTOMY    . TRANSURETHRAL RESECTION OF PROSTATE      . VIDEO BRONCHOSCOPY WITH ENDOBRONCHIAL NAVIGATION N/A 10/10/2013   Procedure: VIDEO BRONCHOSCOPY WITH ENDOBRONCHIAL NAVIGATION;  Surgeon: Melrose Nakayama, MD;  Location: [facility];  Service: Thoracic;  Laterality: N/A;  NO BLOOD THINNERS BUT PATIENT HAS ITP  . WEDGE RESECTION  04/2011   carcinoma of lung    Social History   Socioeconomic History  . Marital status: Married    Spouse name: [name]   . Number of children: 2  . Years of education: 12+  . Highest education level: Not on file  Occupational History  . Occupation: Retired     Fish farm manager: [employer]  Social Needs  . Financial resource strain: Not on file  . Food insecurity:    Worry: Not on file    Inability: Not on file  . Transportation needs:    Medical: Not on file    Non-medical: Not on file  Tobacco Use  . Smoking status: Former Smoker    Packs/day: 1.00    Years: 50.00    Pack years: 50.00    Types: Cigarettes    Start date: 07/22/1955  . Smokeless tobacco: Never Used  . Tobacco comment: "smokes a few days a week" 09/29/17  Substance and Sexual Activity  . Alcohol use: No    Alcohol/week: 0.0 standard drinks  . Drug use: No  . Sexual activity: Never  Lifestyle  . Physical activity:    Days per week: Not on file    Minutes per session: Not on file  . Stress: Not on file  Relationships  . Social connections:    Talks on phone: Not on file    Gets together: Not on file    Attends religious service: Not on file    Active member of club or organization: Not on file    Attends meetings of clubs or organizations: Not on file    Relationship status: Not on file  . Intimate partner violence:    Fear of current or ex partner: Not on file    Emotionally abused: Not on file    Physically abused: Not on file    Forced sexual activity: Not on file  Other Topics Concern  . Not on file  Social History Narrative   Patient lives at home with his wife [name].    Patient has 2 children.    Patient is retired.     Patient is right handed.    Patient has 2 years of college.    3-4 cups of caffeine daily.   Family History  Problem Relation Age of Onset  . Aortic aneurysm Mother   . Emphysema Father        smoker  . Clotting disorder Father   . Arthritis Father   . Hypertension Father   . Diabetes Father   . Aortic aneurysm Father   . Tremor Father   .  Stroke Paternal Grandmother   . Other Paternal Grandfather        brain aneurysm  . Colon cancer Cousin       VITAL SIGNS BP 114/69   Pulse 79   Temp 98.4 F (36.9 C)   Resp 18   Ht 6' (1.829 m)   Wt 249 lb (112.9 kg)   SpO2 97%   BMI 33.77 kg/m   Outpatient Encounter Medications as of 09/08/2018  Medication Sig  . Acetylcysteine (NAC PO) Take 1 capsule by mouth 2 (two) times daily. 600 mg  . albuterol (PROAIR HFA) 108 (90 Base) MCG/ACT inhaler INHALE 2 PUFFS INTO THE LUNGS 4 TIMES DAILY AS NEEDED.  Marland Kitchen ALPRAZolam (XANAX) 0.5 MG tablet Take 1 tablet (0.5 mg total) by mouth at bedtime.  Marland Kitchen aspirin EC 81 MG tablet Take 81 mg by mouth every morning.   . budesonide-formoterol (SYMBICORT) 160-4.5 MCG/ACT inhaler Inhale 2 puffs into the lungs 2 (two) times daily.  . cholecalciferol (VITAMIN D) 1000 units tablet Take 2,000 Units by mouth every morning.   . citalopram (CELEXA) 20 MG tablet TAKE ONE (1) TABLET BY MOUTH EVERY DAY  . clopidogrel (PLAVIX) 75 MG tablet Take 75 mg by mouth every morning.  . Cyanocobalamin (B-12) 2500 MCG TABS Take 1 tablet by mouth every morning.  . fluticasone (FLONASE) 50 MCG/ACT nasal spray Place 1 spray into both nostrils daily.  Marland Kitchen glucosamine-chondroitin 500-400 MG tablet Take 1 tablet by mouth 2 (two) times daily.  Marland Kitchen ipratropium-albuterol (DUONEB) 0.5-2.5 (3) MG/3ML SOLN Take 3 mLs by nebulization every 4 (four) hours as needed.  . magnesium hydroxide (MILK OF MAGNESIA) 400 MG/5ML suspension Take 30 mLs by mouth daily as needed for mild constipation.  . metoprolol tartrate (LOPRESSOR) 25 MG tablet Take 25 mg  by mouth 2 (two) times daily.  . niacin (SLO-NIACIN) 500 MG tablet Take 1,000 mg by mouth at bedtime.  . nitroGLYCERIN (NITROSTAT) 0.4 MG SL tablet Place 1 tablet (0.4 mg total) under the tongue every 5 (five) minutes as needed. Call MD if need more than 2  . NON FORMULARY CPAP while sleeping with Oxygen bleed in at @@ 2.5 L/min  . NON FORMULARY Diet Type:  [name], consistent Carbohydrate  . OLANZapine (ZYPREXA) 5 MG tablet Take 5 mg by mouth every evening.   . OXYGEN Inhale into the lungs. Taper Oxygen to maintain Stat above 90  . pravastatin (PRAVACHOL) 80 MG tablet TAKE ONE (1) TABLET EACH DAY  . predniSONE (DELTASONE) 10 MG tablet Take 10 mg by mouth daily with breakfast.   . Probiotic Product (PROBIOTIC PEARLS ADVANTAGE) CAPS Take 1 capsule by mouth daily.  . sodium bicarbonate 650 MG tablet Take 1,300 mg by mouth 2 (two) times daily.  Marland Kitchen tiotropium (SPIRIVA HANDIHALER) 18 MCG inhalation capsule PLACE ONE CAPSULE INTO INHALER AND INHALE DAILY  . [DISCONTINUED] ciprofloxacin (CIPRO) 500 MG tablet Take 1 tablet (500 mg total) by mouth 2 (two) times daily. One po bid x 7 days (Patient not taking: Reported on 09/08/2018)   No facility-administered encounter medications on file as of 09/08/2018.      SIGNIFICANT DIAGNOSTIC EXAMS  LABS REVIEWED TODAY:   09-10-18: wbc 8.8; hgb 8.1; hct 28.2; mcv 103.3; plt 53 glucose 110; bun 58 creat 3.41 k+ 5.1; na++ 140 ca 8.4 liver normal albumin 2.6  Review of Systems  Constitutional: Positive for malaise/fatigue.  Respiratory: Positive for cough, sputum production and shortness of breath.   Cardiovascular: Negative for chest pain, palpitations  and leg swelling.  Gastrointestinal: Negative for abdominal pain, constipation and heartburn.  Musculoskeletal: Negative for back pain, joint pain and myalgias.  Skin: Negative.   Neurological: Negative for dizziness.  Psychiatric/Behavioral: The patient is not nervous/anxious.     Physical Exam Constitutional:       General: He is not in acute distress.    Appearance: He is well-developed. He is not diaphoretic.  Neck:     Musculoskeletal: Neck supple.     Thyroid: No thyromegaly.  Cardiovascular:     Rate and Rhythm: Normal rate and regular rhythm.     Pulses: Normal pulses.     Heart sounds: Normal heart sounds.  Pulmonary:     Effort: No respiratory distress.     Breath sounds: Wheezing present.     Comments: Increased effort  Abdominal:     General: Bowel sounds are normal. There is no distension.     Palpations: Abdomen is soft.     Tenderness: There is no abdominal tenderness.  Musculoskeletal:     Right lower leg: No edema.     Left lower leg: No edema.     Comments: Is able to move all extremities   Lymphadenopathy:     Cervical: No cervical adenopathy.  Skin:    General: Skin is warm and dry.     Comments: Right nephrostomy stent without signs of infection present.   Neurological:     Mental Status: He is alert and oriented to person, place, and time.  Psychiatric:        Mood and Affect: Mood normal.      ASSESSMENT/ PLAN:  TODAY:   1. HCAP: is worse: will begin doxycycline 100 mg twice daily with probiotic twice daily through 09-15-18 will change duoneb to every 6 hours routinely through 09-15-18 then change to every 6 hour as needed.  MD is aware of resident's narcotic use and is in agreement with current plan of care. We will attempt to wean resident as apropriate   Ok Edwards NP [facility]  Contact [phone] Monday through Friday 8am- 5pm  After hours call [phone]

## 2018-09-09 ENCOUNTER — Emergency Department (HOSPITAL_COMMUNITY): Payer: Medicare Other

## 2018-09-09 ENCOUNTER — Encounter (HOSPITAL_COMMUNITY): Payer: Self-pay | Admitting: Emergency Medicine

## 2018-09-09 ENCOUNTER — Non-Acute Institutional Stay (SKILLED_NURSING_FACILITY): Payer: Medicare Other | Admitting: Adult Health

## 2018-09-09 ENCOUNTER — Encounter: Payer: Self-pay | Admitting: Adult Health

## 2018-09-09 ENCOUNTER — Other Ambulatory Visit: Payer: Self-pay

## 2018-09-09 ENCOUNTER — Inpatient Hospital Stay (HOSPITAL_COMMUNITY)
Admission: EM | Admit: 2018-09-09 | Discharge: 2018-09-19 | DRG: 871 | Disposition: E | Payer: Medicare Other | Attending: Internal Medicine | Admitting: Internal Medicine

## 2018-09-09 ENCOUNTER — Other Ambulatory Visit (HOSPITAL_COMMUNITY): Payer: Medicare Other

## 2018-09-09 DIAGNOSIS — Z720 Tobacco use: Secondary | ICD-10-CM | POA: Diagnosis present

## 2018-09-09 DIAGNOSIS — F329 Major depressive disorder, single episode, unspecified: Secondary | ICD-10-CM | POA: Diagnosis present

## 2018-09-09 DIAGNOSIS — R451 Restlessness and agitation: Secondary | ICD-10-CM | POA: Diagnosis not present

## 2018-09-09 DIAGNOSIS — Z79899 Other long term (current) drug therapy: Secondary | ICD-10-CM | POA: Diagnosis not present

## 2018-09-09 DIAGNOSIS — E875 Hyperkalemia: Secondary | ICD-10-CM | POA: Diagnosis present

## 2018-09-09 DIAGNOSIS — R410 Disorientation, unspecified: Secondary | ICD-10-CM

## 2018-09-09 DIAGNOSIS — E119 Type 2 diabetes mellitus without complications: Secondary | ICD-10-CM | POA: Diagnosis not present

## 2018-09-09 DIAGNOSIS — Z7984 Long term (current) use of oral hypoglycemic drugs: Secondary | ICD-10-CM | POA: Diagnosis not present

## 2018-09-09 DIAGNOSIS — E785 Hyperlipidemia, unspecified: Secondary | ICD-10-CM | POA: Diagnosis present

## 2018-09-09 DIAGNOSIS — I10 Essential (primary) hypertension: Secondary | ICD-10-CM | POA: Diagnosis not present

## 2018-09-09 DIAGNOSIS — G4733 Obstructive sleep apnea (adult) (pediatric): Secondary | ICD-10-CM | POA: Diagnosis not present

## 2018-09-09 DIAGNOSIS — D693 Immune thrombocytopenic purpura: Secondary | ICD-10-CM | POA: Diagnosis present

## 2018-09-09 DIAGNOSIS — F419 Anxiety disorder, unspecified: Secondary | ICD-10-CM | POA: Diagnosis present

## 2018-09-09 DIAGNOSIS — J302 Other seasonal allergic rhinitis: Secondary | ICD-10-CM

## 2018-09-09 DIAGNOSIS — E1122 Type 2 diabetes mellitus with diabetic chronic kidney disease: Secondary | ICD-10-CM | POA: Diagnosis present

## 2018-09-09 DIAGNOSIS — Z7189 Other specified counseling: Secondary | ICD-10-CM

## 2018-09-09 DIAGNOSIS — E1142 Type 2 diabetes mellitus with diabetic polyneuropathy: Secondary | ICD-10-CM | POA: Diagnosis present

## 2018-09-09 DIAGNOSIS — R0602 Shortness of breath: Secondary | ICD-10-CM

## 2018-09-09 DIAGNOSIS — R0682 Tachypnea, not elsewhere classified: Secondary | ICD-10-CM

## 2018-09-09 DIAGNOSIS — J9601 Acute respiratory failure with hypoxia: Secondary | ICD-10-CM | POA: Diagnosis not present

## 2018-09-09 DIAGNOSIS — I129 Hypertensive chronic kidney disease with stage 1 through stage 4 chronic kidney disease, or unspecified chronic kidney disease: Secondary | ICD-10-CM | POA: Diagnosis present

## 2018-09-09 DIAGNOSIS — A4189 Other specified sepsis: Secondary | ICD-10-CM | POA: Diagnosis present

## 2018-09-09 DIAGNOSIS — J90 Pleural effusion, not elsewhere classified: Secondary | ICD-10-CM | POA: Diagnosis not present

## 2018-09-09 DIAGNOSIS — I2510 Atherosclerotic heart disease of native coronary artery without angina pectoris: Secondary | ICD-10-CM | POA: Diagnosis present

## 2018-09-09 DIAGNOSIS — R000 Tachycardia, unspecified: Secondary | ICD-10-CM | POA: Diagnosis not present

## 2018-09-09 DIAGNOSIS — Z515 Encounter for palliative care: Secondary | ICD-10-CM

## 2018-09-09 DIAGNOSIS — D696 Thrombocytopenia, unspecified: Secondary | ICD-10-CM | POA: Diagnosis not present

## 2018-09-09 DIAGNOSIS — K219 Gastro-esophageal reflux disease without esophagitis: Secondary | ICD-10-CM | POA: Diagnosis not present

## 2018-09-09 DIAGNOSIS — Z7951 Long term (current) use of inhaled steroids: Secondary | ICD-10-CM | POA: Diagnosis not present

## 2018-09-09 DIAGNOSIS — Z9989 Dependence on other enabling machines and devices: Secondary | ICD-10-CM

## 2018-09-09 DIAGNOSIS — J440 Chronic obstructive pulmonary disease with acute lower respiratory infection: Secondary | ICD-10-CM | POA: Diagnosis present

## 2018-09-09 DIAGNOSIS — J441 Chronic obstructive pulmonary disease with (acute) exacerbation: Secondary | ICD-10-CM | POA: Diagnosis present

## 2018-09-09 DIAGNOSIS — F323 Major depressive disorder, single episode, severe with psychotic features: Secondary | ICD-10-CM

## 2018-09-09 DIAGNOSIS — A419 Sepsis, unspecified organism: Secondary | ICD-10-CM

## 2018-09-09 DIAGNOSIS — J101 Influenza due to other identified influenza virus with other respiratory manifestations: Secondary | ICD-10-CM | POA: Diagnosis not present

## 2018-09-09 DIAGNOSIS — C3491 Malignant neoplasm of unspecified part of right bronchus or lung: Secondary | ICD-10-CM

## 2018-09-09 DIAGNOSIS — I252 Old myocardial infarction: Secondary | ICD-10-CM | POA: Diagnosis not present

## 2018-09-09 DIAGNOSIS — N184 Chronic kidney disease, stage 4 (severe): Secondary | ICD-10-CM | POA: Diagnosis present

## 2018-09-09 DIAGNOSIS — F32A Depression, unspecified: Secondary | ICD-10-CM | POA: Diagnosis present

## 2018-09-09 DIAGNOSIS — Z7982 Long term (current) use of aspirin: Secondary | ICD-10-CM | POA: Diagnosis not present

## 2018-09-09 DIAGNOSIS — Z87891 Personal history of nicotine dependence: Secondary | ICD-10-CM | POA: Diagnosis not present

## 2018-09-09 DIAGNOSIS — F3341 Major depressive disorder, recurrent, in partial remission: Secondary | ICD-10-CM | POA: Diagnosis not present

## 2018-09-09 DIAGNOSIS — Z7902 Long term (current) use of antithrombotics/antiplatelets: Secondary | ICD-10-CM | POA: Diagnosis not present

## 2018-09-09 DIAGNOSIS — Z9981 Dependence on supplemental oxygen: Secondary | ICD-10-CM | POA: Diagnosis not present

## 2018-09-09 DIAGNOSIS — I25118 Atherosclerotic heart disease of native coronary artery with other forms of angina pectoris: Secondary | ICD-10-CM

## 2018-09-09 LAB — CBC WITH DIFFERENTIAL/PLATELET
Abs Immature Granulocytes: 0.11 10*3/uL — ABNORMAL HIGH (ref 0.00–0.07)
BASOS ABS: 0 10*3/uL (ref 0.0–0.1)
Basophils Relative: 0 %
Eosinophils Absolute: 0 10*3/uL (ref 0.0–0.5)
Eosinophils Relative: 0 %
HCT: 31.6 % — ABNORMAL LOW (ref 39.0–52.0)
Hemoglobin: 9.5 g/dL — ABNORMAL LOW (ref 13.0–17.0)
Immature Granulocytes: 1 %
Lymphocytes Relative: 3 %
Lymphs Abs: 0.3 10*3/uL — ABNORMAL LOW (ref 0.7–4.0)
MCH: 30.9 pg (ref 26.0–34.0)
MCHC: 30.1 g/dL (ref 30.0–36.0)
MCV: 102.9 fL — ABNORMAL HIGH (ref 80.0–100.0)
Monocytes Absolute: 0.7 10*3/uL (ref 0.1–1.0)
Monocytes Relative: 7 %
Neutro Abs: 9.7 10*3/uL — ABNORMAL HIGH (ref 1.7–7.7)
Neutrophils Relative %: 89 %
Platelets: 57 10*3/uL — ABNORMAL LOW (ref 150–400)
RBC: 3.07 MIL/uL — AB (ref 4.22–5.81)
RDW: 17.5 % — ABNORMAL HIGH (ref 11.5–15.5)
WBC: 10.9 10*3/uL — AB (ref 4.0–10.5)
nRBC: 0 % (ref 0.0–0.2)

## 2018-09-09 LAB — COMPREHENSIVE METABOLIC PANEL
ALK PHOS: 54 U/L (ref 38–126)
ALT: 14 U/L (ref 0–44)
AST: 14 U/L — ABNORMAL LOW (ref 15–41)
Albumin: 3.3 g/dL — ABNORMAL LOW (ref 3.5–5.0)
Anion gap: 10 (ref 5–15)
BUN: 55 mg/dL — ABNORMAL HIGH (ref 8–23)
CO2: 22 mmol/L (ref 22–32)
Calcium: 9 mg/dL (ref 8.9–10.3)
Chloride: 106 mmol/L (ref 98–111)
Creatinine, Ser: 3.19 mg/dL — ABNORMAL HIGH (ref 0.61–1.24)
GFR calc Af Amer: 20 mL/min — ABNORMAL LOW (ref >60)
GFR calc non Af Amer: 17 mL/min — ABNORMAL LOW (ref >60)
Glucose, Bld: 145 mg/dL — ABNORMAL HIGH (ref 70–99)
Potassium: 5.2 mmol/L — ABNORMAL HIGH (ref 3.5–5.1)
Sodium: 138 mmol/L (ref 135–145)
Total Bilirubin: 2.5 mg/dL — ABNORMAL HIGH (ref 0.3–1.2)
Total Protein: 6.5 g/dL (ref 6.5–8.1)

## 2018-09-09 LAB — BLOOD GAS, ARTERIAL
Acid-base deficit: 0.5 mmol/L (ref 0.0–2.0)
Bicarbonate: 24.2 mmol/L (ref 20.0–28.0)
FIO2: 40
O2 Saturation: 86.3 %
PCO2 ART: 31.8 mmHg — AB (ref 32.0–48.0)
Patient temperature: 38.8
pH, Arterial: 7.468 — ABNORMAL HIGH (ref 7.350–7.450)
pO2, Arterial: 54.1 mmHg — ABNORMAL LOW (ref 83.0–108.0)

## 2018-09-09 LAB — INFLUENZA PANEL BY PCR (TYPE A & B)
Influenza A By PCR: POSITIVE — AB
Influenza B By PCR: NEGATIVE

## 2018-09-09 LAB — LACTIC ACID, PLASMA
Lactic Acid, Venous: 2.1 mmol/L (ref 0.5–1.9)
Lactic Acid, Venous: 1.5 mmol/L (ref 0.5–1.9)

## 2018-09-09 LAB — BRAIN NATRIURETIC PEPTIDE: B Natriuretic Peptide: 496 pg/mL — ABNORMAL HIGH (ref 0.0–100.0)

## 2018-09-09 MED ORDER — SODIUM CHLORIDE 0.9 % IV SOLN: 2.0000 g | INTRAVENOUS | Status: DC

## 2018-09-09 MED ORDER — OSELTAMIVIR PHOSPHATE 75 MG PO CAPS: 75.0000 mg | ORAL_CAPSULE | Freq: Two times a day (BID) | ORAL | Status: DC

## 2018-09-09 MED ORDER — SODIUM CHLORIDE 0.9 % IV SOLN
2.0000 g | Freq: Once | INTRAVENOUS | Status: AC
  Administered 2018-09-09: 2 g via INTRAVENOUS
  Filled 2018-09-09: qty 2

## 2018-09-09 MED ORDER — MIDODRINE HCL 5 MG PO TABS
10.0000 mg | ORAL_TABLET | Freq: Once | ORAL | Status: AC
  Administered 2018-09-09: 10 mg via ORAL
  Filled 2018-09-09 (×2): qty 2

## 2018-09-09 MED ORDER — VANCOMYCIN HCL IN DEXTROSE 1-5 GM/200ML-% IV SOLN
1000.0000 mg | Freq: Once | INTRAVENOUS | Status: AC
  Administered 2018-09-09: 1000 mg via INTRAVENOUS
  Filled 2018-09-09: qty 200

## 2018-09-09 MED ORDER — OSELTAMIVIR PHOSPHATE 30 MG PO CAPS
30.0000 mg | ORAL_CAPSULE | Freq: Every day | ORAL | Status: AC
  Administered 2018-09-09 – 2018-09-13 (×5): 30 mg via ORAL
  Filled 2018-09-09 (×5): qty 1

## 2018-09-09 MED ORDER — METRONIDAZOLE IN NACL 5-0.79 MG/ML-% IV SOLN
500.0000 mg | Freq: Three times a day (TID) | INTRAVENOUS | Status: DC
  Administered 2018-09-09 – 2018-09-11 (×6): 500 mg via INTRAVENOUS
  Filled 2018-09-09 (×6): qty 100

## 2018-09-09 MED ORDER — LEVALBUTEROL HCL 0.63 MG/3ML IN NEBU: 0.6300 mg | INHALATION_SOLUTION | Freq: Four times a day (QID) | RESPIRATORY_TRACT | Status: DC | PRN

## 2018-09-09 MED ORDER — LEVALBUTEROL HCL 0.63 MG/3ML IN NEBU
0.6300 mg | INHALATION_SOLUTION | Freq: Four times a day (QID) | RESPIRATORY_TRACT | Status: DC
  Administered 2018-09-09 – 2018-09-13 (×16): 0.63 mg via RESPIRATORY_TRACT
  Filled 2018-09-09 (×16): qty 3

## 2018-09-09 MED ORDER — ACETAMINOPHEN 500 MG PO TABS
1000.0000 mg | ORAL_TABLET | Freq: Once | ORAL | Status: AC
  Administered 2018-09-09: 1000 mg via ORAL
  Filled 2018-09-09: qty 2

## 2018-09-09 MED ORDER — SODIUM CHLORIDE 0.9 % IV SOLN
INTRAVENOUS | Status: DC
  Administered 2018-09-09: 75 mL/h via INTRAVENOUS
  Administered 2018-09-10: 14:00:00 via INTRAVENOUS

## 2018-09-09 MED ORDER — OSELTAMIVIR PHOSPHATE 30 MG PO CAPS: 30.0000 mg | ORAL_CAPSULE | Freq: Two times a day (BID) | ORAL | Status: DC

## 2018-09-09 MED ORDER — SODIUM CHLORIDE 0.9 % IV BOLUS
1000.0000 mL | Freq: Once | INTRAVENOUS | Status: AC
  Administered 2018-09-09: 1000 mL via INTRAVENOUS

## 2018-09-09 MED ORDER — ACETAMINOPHEN 325 MG PO TABS
650.0000 mg | ORAL_TABLET | Freq: Four times a day (QID) | ORAL | Status: DC | PRN
  Administered 2018-09-10 (×2): 650 mg via ORAL
  Filled 2018-09-09 (×3): qty 2

## 2018-09-09 MED ORDER — FAMOTIDINE 20 MG PO TABS
20.0000 mg | ORAL_TABLET | Freq: Every day | ORAL | Status: DC
  Administered 2018-09-10 – 2018-09-15 (×6): 20 mg via ORAL
  Filled 2018-09-09 (×6): qty 1

## 2018-09-09 MED ORDER — ACETAMINOPHEN 650 MG RE SUPP: 650.0000 mg | Freq: Four times a day (QID) | RECTAL | Status: DC | PRN

## 2018-09-09 MED ORDER — VANCOMYCIN HCL 10 G IV SOLR
1500.0000 mg | INTRAVENOUS | Status: DC
  Filled 2018-09-09: qty 1500

## 2018-09-09 MED ORDER — IPRATROPIUM BROMIDE 0.02 % IN SOLN
0.5000 mg | Freq: Four times a day (QID) | RESPIRATORY_TRACT | Status: DC
  Administered 2018-09-09 – 2018-09-13 (×16): 0.5 mg via RESPIRATORY_TRACT
  Filled 2018-09-09 (×16): qty 2.5

## 2018-09-09 NOTE — ED Notes (Signed)
CRITICAL VALUE ALERT  Critical Value: Lactic Acid 2.1  Date & Time Notied:  09/05/2018 & 1939 hrs  Provider Notified: Dr. Rogene Houston  Orders Received/Actions taken: N/A

## 2018-09-09 NOTE — ED Triage Notes (Signed)
Patient from [facility] brought over to ED today due to increased SOB, fever,  Confusion due to low oxygen level when he removes his oxygen from his nose. PT was seen by MD at [facility] yesterday and was started on Doxycyline and breathing treatments four times a day. Family present at bedside and oxygen reading 94% on 3L via N/C upon arrival with fever.

## 2018-09-09 NOTE — ED Notes (Signed)
Lab at the bedside to get cultures

## 2018-09-09 NOTE — ED Provider Notes (Signed)
[facility] EMERGENCY DEPARTMENT Provider Note   CSN: [id number] Arrival date & time: 09/15/2018  1750    History   Chief Complaint Chief Complaint  Patient presents with  . Shortness of Breath    HPI Kyle [name] is a 85 y.o. male.     Patient sent over from [facility].  With some concerns about upper respiratory infection may be a pneumonia started on doxycycline 1 or 2 days ago.  Patient here today presenting with tremors due to chills high fever tachycardia and increased respiratory rate.  Symptoms suggestive of flulike illness possibly but certainly meets sepsis criteria based on vital signs.  Known to me I was involved with his admission on February 7 for an exacerbation of COPD.  Patient has grade 3 renal failure.  Patient is a DNR.  Following his admission on February 7 until February 11 he was discharged to the [facility] on 3 L of oxygen.     Past Medical History:  Diagnosis Date  . AAA (abdominal aortic aneurysm) ([place]) 2004   s/p repair 2004; 4.3 cm infrarenal in 05/2011  . Abnormality of gait 02/23/2013  . Adenocarcinoma of right lung ([place]) 02/24/2011   Ct A/P 2012:  2cm lung mass RLL PET 9678:  Hypermetabolic RLL mass, no other hypermetabolic areas. TTNA 02/2011:  Adenocarcinoma, markers c/w lung origin Right lower lobe superior segmentectomy. 04/01/2011 Dr. Arlyce Dice   . Adenocarcinoma, lung ([place]) 01/2011   transthoracic FNA; resection of the superior segment of the RLL in 03/2011; negative nodes; no chemotherapy nor radiation planned  . Arm fracture    right arm  . Arteriosclerotic cardiovascular disease (ASCVD) 1973, 12/2010   S/P NSTEMI secondary to distal RCA/PL lesion, tx medically.  EF of  55%-60% per  echo.  . Benign prostatic hypertrophy    s/p transurethral resection of the prostate  . Bilateral renal masses    Cystic, more prominent on CT in 12/2010 than 2007; followed by Dr. Rosana Hoes  . Bladder cancer [facility]) 1996   Transurethral resection of the bladder +  chemotherapy/BCG as premed  . Cataract   . Chronic kidney disease    Creatinine 1.4 on discharge 12/20/2100; proteinuria; normal renal ultrasound in 2010; recent creatinines of 1.7-2.; Bilateral cystic renal masses by CT in 2011  . COPD (chronic obstructive pulmonary disease) ([place])   . Coronary artery disease   . Cough    thick phlegm  . Diabetes mellitus    Type II  . Diplopia 02/23/2013  . Diverticulosis   . Essential and other specified forms of tremor 02/23/2013  . Hx of Clostridium difficile infection   . Hyperlipidemia   . Hypertension   . Insomnia   . ITP (idiopathic thrombocytopenic purpura) 09/07/2012   Chronic ITP of adults versus medication-induced ITP.  Stable  . Myocardial infarction ([place])   . Nephrolithiasis 2012   ARF in 01/2011 due to obstructing nephrolithiasis  . Obesity   . OSA (obstructive sleep apnea)   . Polyneuropathy in diabetes(357.2) 02/23/2013  . Thrombocytopenia ([place])   . Tobacco abuse    50-pack-year consumption; quit in 12/2010  . Tubular adenoma of colon   . Ventral hernia     Patient Active Problem List   Diagnosis Date Noted  . Hypoxia   . Gastroesophageal reflux disease   . Benign essential HTN   . Stage 4 chronic kidney disease ([place]) 06/30/2016  . Subdural hematoma, post-traumatic ([place]) 03/21/2016  . Subdural hematoma ([place]) 03/21/2016  . Malignant neoplasm  of trigone of urinary bladder ([place]) 02/12/2016  . Bilirubinemia 12/12/2015  . Thrombocytopenia ([place]) 11/13/2015  . Cough 10/03/2015  . Allergic rhinitis 10/03/2015  . Secondary hyperparathyroidism ([place]) 09/06/2015  . Tremor, essential 08/27/2015  . History of colonic polyps   . Diverticulosis of colon without hemorrhage   . Malignant neoplasm of lateral wall of urinary bladder ([place]) 01/12/2014  . CIS (carcinoma in situ of bladder) 08/22/2013  . Static tremor 02/23/2013  . Diplopia 02/23/2013  . Diabetic polyneuropathy ([place]) 02/23/2013  . Abnormality of gait 02/23/2013  . COPD  exacerbation ([place]) 01/31/2013  . Fracture of right distal radius 12/14/2012  . Diabetes mellitus ([place]) 11/30/2012  . Idiopathic thrombocytopenic purpura ([place]) 09/07/2012  . Depression 07/28/2012  . Benign prostate hyperplasia 06/10/2012  . LLQ pain 11/13/2011  . Hernia of anterior abdominal wall 10/28/2011  . AAA (abdominal aortic aneurysm) ([place])   . Tobacco abuse   . Diarrhea 10/22/2011  . Tubular adenoma of colon   . Bladder neck obstruction 06/05/2011  . Obstructive sleep apnea on CPAP 03/27/2011  . Fasting hyperglycemia 03/27/2011  . Recurrent nephrolithiasis 03/27/2011  . Carcinoma of bladder ([place]) 03/27/2011  . COPD with acute bronchitis ([place]) 02/24/2011  . Adenocarcinoma of right lung ([place]) 02/24/2011  . CAD (coronary artery disease) 01/06/2011    Past Surgical History:  Procedure Laterality Date  . ABDOMINAL AORTIC ANEURYSM REPAIR  2004  . CARDIAC CATHETERIZATION    . COLONOSCOPY  03/19/2010   Dr. Gala Romney -(poor prep) Anal papilla, rectal hyperplastic polyp, tubular adenoma removed splenic flexure, left-sided diverticula  . COLONOSCOPY  11/28/2004   RMR:  Diminutive rectal and left colon polyps as described above, cold  biopsied/removed/  Left sided diverticula. The remainder of the colonic mucosa appeared normal.  . COLONOSCOPY   09/15/01   RMR: Multiple diminutive polyps destroyed with dermolysis as described above/ Multiple small polyps on stalks in the colon resected with snare cautery/ Scattered pan colonic diverticulum/ The remainder of the colonic mucosa appeared normal  . COLONOSCOPY N/A 05/01/2015   Procedure: COLONOSCOPY;  Surgeon: Daneil Dolin, MD;  Location: [facility];  Service: Endoscopy;  Laterality: N/A;  1115  . CYSTECTOMY    . CYSTOSCOPY  04/2014  . CYSTOSTOMY W/ BLADDER BIOPSY    . FLEXIBLE SIGMOIDOSCOPY  2014   Dr. Olevia Perches: tubular adenoma, negative stool studies   . IR EXT NEPHROURETERAL CATH EXCHANGE  07/15/2018  . LUNG LOBECTOMY    .  TONSILLECTOMY    . TRANSURETHRAL RESECTION OF PROSTATE    . VIDEO BRONCHOSCOPY WITH ENDOBRONCHIAL NAVIGATION N/A 10/10/2013   Procedure: VIDEO BRONCHOSCOPY WITH ENDOBRONCHIAL NAVIGATION;  Surgeon: Melrose Nakayama, MD;  Location: [place];  Service: Thoracic;  Laterality: N/A;  NO BLOOD THINNERS BUT PATIENT HAS ITP  . WEDGE RESECTION  04/2011   carcinoma of lung        Home Medications    Prior to Admission medications   Medication Sig Start Date End Date Taking? Authorizing Provider  Acetylcysteine (NAC PO) Take 1 capsule by mouth 2 (two) times daily. 600 mg    [provider]  albuterol (PROAIR HFA) 108 (90 Base) MCG/ACT inhaler INHALE 2 PUFFS INTO THE LUNGS 4 TIMES DAILY AS NEEDED. 07/28/18   Juanito Doom, MD  ALPRAZolam Duanne Moron) 0.5 MG tablet Take 1 tablet (0.5 mg total) by mouth at bedtime. 09/01/18   Wille Celeste, PA-C  aspirin EC 81 MG tablet Take 81 mg by mouth every morning.  [provider]  budesonide-formoterol (SYMBICORT) 160-4.5 MCG/ACT inhaler Inhale 2 puffs into the lungs 2 (two) times daily. 08/31/18   Barton Dubois, MD  cholecalciferol (VITAMIN D) 1000 units tablet Take 2,000 Units by mouth every morning.     [provider]  citalopram (CELEXA) 20 MG tablet TAKE ONE (1) TABLET BY MOUTH EVERY DAY 08/11/18   Mikey Kirschner, MD  clopidogrel (PLAVIX) 75 MG tablet Take 75 mg by mouth every morning.    [provider]  Cyanocobalamin (B-12) 2500 MCG TABS Take 1 tablet by mouth every morning.    [provider]  doxycycline (VIBRAMYCIN) 100 MG capsule Take 100 mg by mouth 2 (two) times daily. 09/08/18 09/15/18  [provider]  fluticasone (FLONASE) 50 MCG/ACT nasal spray Place 1 spray into both nostrils daily. 11/09/17   Mikey Kirschner, MD  glucosamine-chondroitin 500-400 MG tablet Take 1 tablet by mouth 2 (two) times daily.    [provider]  ipratropium-albuterol (DUONEB) 0.5-2.5 (3) MG/3ML SOLN Take 3  mLs by nebulization every 6 (six) hours. 07/09/19 - 09/15/18 then give every 6 hours as needed starting on 09/29/18    [provider]  magnesium hydroxide (MILK OF MAGNESIA) 400 MG/5ML suspension Take 30 mLs by mouth daily as needed for mild constipation. 09/05/18   [provider]  metoprolol tartrate (LOPRESSOR) 25 MG tablet Take 25 mg by mouth 2 (two) times daily.    [provider]  niacin (SLO-NIACIN) 500 MG tablet Take 1,000 mg by mouth at bedtime.    [provider]  nitroGLYCERIN (NITROSTAT) 0.4 MG SL tablet Place 1 tablet (0.4 mg total) under the tongue every 5 (five) minutes as needed. Call MD if need more than 2 04/18/16   Mikey Kirschner, MD  NON FORMULARY CPAP while sleeping with Oxygen bleed in at @@ 2.5 L/min 08/31/18   [provider]  NON FORMULARY Diet Type:  [name], consistent Carbohydrate 08/31/18   [provider]  OLANZapine (ZYPREXA) 5 MG tablet Take 5 mg by mouth every evening.     [provider]  OXYGEN Inhale into the lungs. Taper Oxygen to maintain Stat above 90 09/02/18   [provider]  pravastatin (PRAVACHOL) 80 MG tablet TAKE ONE (1) TABLET EACH DAY 08/11/18   Mikey Kirschner, MD  Probiotic Product (PROBIOTIC PEARLS ADVANTAGE) CAPS Take 1 capsule by mouth daily.    [provider]  sodium bicarbonate 650 MG tablet Take 1,300 mg by mouth 2 (two) times daily.    [provider]  tiotropium (SPIRIVA HANDIHALER) 18 MCG inhalation capsule PLACE ONE CAPSULE INTO INHALER AND INHALE DAILY 07/23/18   Kathyrn Drown, MD    Family History Family History  Problem Relation Age of Onset  . Aortic aneurysm Mother   . Emphysema Father        smoker  . Clotting disorder Father   . Arthritis Father   . Hypertension Father   . Diabetes Father   . Aortic aneurysm Father   . Tremor Father   . Stroke Paternal Grandmother   . Other Paternal Grandfather        brain aneurysm  . Colon cancer  Cousin     Social History Social History   Tobacco Use  . Smoking status: Former Smoker    Packs/day: 1.00    Years: 50.00    Pack years: 50.00    Types: Cigarettes    Start date: 07/22/1955  . Smokeless tobacco:  Never Used  . Tobacco comment: "smokes a few days a week" 09/29/17  Substance Use Topics  . Alcohol use: No    Alcohol/week: 0.0 standard drinks  . Drug use: No     Allergies   Codeine; Etodolac; and Zanaflex [tizanidine hcl]   Review of Systems Review of Systems  Constitutional: Positive for chills and fever.  HENT: Negative for rhinorrhea and sore throat.   Eyes: Positive for redness. Negative for visual disturbance.  Respiratory: Positive for cough and shortness of breath.   Cardiovascular: Negative for chest pain and leg swelling.  Gastrointestinal: Negative for abdominal pain, diarrhea, nausea and vomiting.  Genitourinary: Negative for dysuria.  Musculoskeletal: Positive for myalgias. Negative for back pain and neck pain.  Skin: Negative for rash.  Neurological: Negative for dizziness, light-headedness and headaches.  Hematological: Does not bruise/bleed easily.  Psychiatric/Behavioral: Positive for confusion.     Physical Exam Updated Vital Signs BP (!) 158/88 (BP Location: Left Arm)   Pulse (!) 120   Temp (!) 101.9 F (38.8 C) (Oral)   Resp 18   Ht 1.829 m (6')   Wt 112.9 kg   SpO2 96%   BMI 33.77 kg/m   Physical Exam Vitals signs and nursing note reviewed.  Constitutional:      Appearance: He is well-developed. He is toxic-appearing.  HENT:     Head: Normocephalic and atraumatic.     Mouth/Throat:     Mouth: Mucous membranes are dry.  Eyes:     Extraocular Movements: Extraocular movements intact.     Conjunctiva/sclera: Conjunctivae normal.     Pupils: Pupils are equal, round, and reactive to light.     Comments: Sclera erythematous but no discharge  Neck:     Musculoskeletal: Normal range of motion and neck supple.  Cardiovascular:      Rate and Rhythm: Regular rhythm. Tachycardia present.     Heart sounds: No murmur.  Pulmonary:     Effort: Respiratory distress present.     Breath sounds: Normal breath sounds.  Abdominal:     Palpations: Abdomen is soft.     Tenderness: There is no abdominal tenderness.  Musculoskeletal: Normal range of motion.        General: Swelling present.  Skin:    General: Skin is warm and dry.  Neurological:     General: No focal deficit present.     Mental Status: He is alert.     Cranial Nerves: No cranial nerve deficit.     Motor: No weakness.      ED Treatments / Results  Labs (all labs ordered are listed, but only abnormal results are displayed) Labs Reviewed  CULTURE, BLOOD (ROUTINE X 2)  CULTURE, BLOOD (ROUTINE X 2)  LACTIC ACID, PLASMA  LACTIC ACID, PLASMA  COMPREHENSIVE METABOLIC PANEL  CBC WITH DIFFERENTIAL/PLATELET  URINALYSIS, ROUTINE W REFLEX MICROSCOPIC  BLOOD GAS, ARTERIAL  INFLUENZA PANEL BY PCR (TYPE A & B)    EKG None  Radiology No results found.  Procedures Procedures (including critical care time)  CRITICAL CARE Performed by: Fredia Sorrow Total critical care time: 45 minutes Critical care time was exclusive of separately billable procedures and treating other patients. Critical care was necessary to treat or prevent imminent or life-threatening deterioration. Critical care was time spent personally by me on the following activities: development of treatment plan with patient and/or surrogate as well as nursing, discussions with consultants, evaluation of patient's response to treatment, examination of patient, obtaining history from patient or surrogate,  ordering and performing treatments and interventions, ordering and review of laboratory studies, ordering and review of radiographic studies, pulse oximetry and re-evaluation of patient's condition.   Medications Ordered in ED Medications  ceFEPIme (MAXIPIME) 2 g in sodium chloride 0.9 % 100  mL IVPB (has no administration in time range)  metroNIDAZOLE (FLAGYL) IVPB 500 mg (has no administration in time range)  vancomycin (VANCOCIN) IVPB 1000 mg/200 mL premix (has no administration in time range)  sodium chloride 0.9 % bolus 1,000 mL (has no administration in time range)  0.9 %  sodium chloride infusion (has no administration in time range)  acetaminophen (TYLENOL) tablet 1,000 mg (has no administration in time range)     Initial Impression / Assessment and Plan / ED Course  I have reviewed the triage vital signs and the nursing notes.  Pertinent labs & imaging results that were available during my care of the patient were reviewed by me and considered in my medical decision making (see chart for details).     Patient's vital signs meet sepsis criteria.  Patient not a candidate for the full fluid challenge do not know what whether he has leukocytosis or whether his lactic acid is elevated but he has stage III renal failure.  With GFR is around 20.  We will hold off and just give a liter for now.  Patient clinically very dry.  Will start on sepsis criteria order set and also will start on broad-spectrum antibiotics.  Portable chest x-ray Tylenol for the fever 1 L of fluid.  Influenza testing patient will require admission.  Will get a blood gas on the 3 L of oxygen that he supposed to be on at all times.  If respiratory rate does not improve patient may need to go on BiPAP.  To rest the patient will start him on BiPAP.  Discussed with hospitalist they will admit.  Patient showing a little bit of improvement following the Tylenol and initiation of fluids.  Feel that he is got a developing upper respiratory infection whether to be influenza or pneumonia.  Do not think that it is CHF even though he is at risk for that.  Or pulmonary edema at this point in time.  Final Clinical Impressions(s) / ED Diagnoses   Final diagnoses:  None    ED Discharge Orders    None       Fredia Sorrow, MD 09/03/2018 2019

## 2018-09-09 NOTE — Progress Notes (Signed)
Location:   [place] Room Number: 151 P Place of Service:  SNF (31)   CODE STATUS: DNR  Allergies  Allergen Reactions  . Codeine Anaphylaxis  . Etodolac Other (See Comments)    dizziness  . Zanaflex [Tizanidine Hcl] Other (See Comments)    Drowsiness    Chief Complaint  Patient presents with  . Medical Management of Chronic Issues    Benign essential HTN; coronary artery disease of native artery of native heart with stable angina pectoris; obstructive sleep apnea. Weekly follow up for the first 30 days post hospitalization.     HPI:  He is a 85 year old short term rehab patient being seen for the management of his chronic illnesses: hypertension; cad; osa. He is presently being treated for hcap. He still has a cough; no sputum production; no fevers present; still have fatigue present.   Past Medical History:  Diagnosis Date  . AAA (abdominal aortic aneurysm) ([place]) 2004   s/p repair 2004; 4.3 cm infrarenal in 05/2011  . Abnormality of gait 02/23/2013  . Adenocarcinoma of right lung ([place]) 02/24/2011   Ct A/P 2012:  2cm lung mass RLL PET 5631:  Hypermetabolic RLL mass, no other hypermetabolic areas. TTNA 02/2011:  Adenocarcinoma, markers c/w lung origin Right lower lobe superior segmentectomy. 04/01/2011 Dr. Arlyce Dice   . Adenocarcinoma, lung ([place]) 01/2011   transthoracic FNA; resection of the superior segment of the RLL in 03/2011; negative nodes; no chemotherapy nor radiation planned  . Arm fracture    right arm  . Arteriosclerotic cardiovascular disease (ASCVD) 1973, 12/2010   S/P NSTEMI secondary to distal RCA/PL lesion, tx medically.  EF of  55%-60% per  echo.  . Benign prostatic hypertrophy    s/p transurethral resection of the prostate  . Bilateral renal masses    Cystic, more prominent on CT in 12/2010 than 2007; followed by Dr. Rosana Hoes  . Bladder cancer [facility]) 1996   Transurethral resection of the bladder + chemotherapy/BCG as premed  . Cataract   .  Chronic kidney disease    Creatinine 1.4 on discharge 12/20/2100; proteinuria; normal renal ultrasound in 2010; recent creatinines of 1.7-2.; Bilateral cystic renal masses by CT in 2011  . COPD (chronic obstructive pulmonary disease) ([place])   . Coronary artery disease   . Cough    thick phlegm  . Diabetes mellitus    Type II  . Diplopia 02/23/2013  . Diverticulosis   . Essential and other specified forms of tremor 02/23/2013  . Hx of Clostridium difficile infection   . Hyperlipidemia   . Hypertension   . Insomnia   . ITP (idiopathic thrombocytopenic purpura) 09/07/2012   Chronic ITP of adults versus medication-induced ITP.  Stable  . Myocardial infarction ([place])   . Nephrolithiasis 2012   ARF in 01/2011 due to obstructing nephrolithiasis  . Obesity   . OSA (obstructive sleep apnea)   . Polyneuropathy in diabetes(357.2) 02/23/2013  . Thrombocytopenia ([place])   . Tobacco abuse    50-pack-year consumption; quit in 12/2010  . Tubular adenoma of colon   . Ventral hernia     Past Surgical History:  Procedure Laterality Date  . ABDOMINAL AORTIC ANEURYSM REPAIR  2004  . CARDIAC CATHETERIZATION    . COLONOSCOPY  03/19/2010   Dr. Gala Romney -(poor prep) Anal papilla, rectal hyperplastic polyp, tubular adenoma removed splenic flexure, left-sided diverticula  . COLONOSCOPY  11/28/2004   RMR:  Diminutive rectal and left colon polyps as described above, cold  biopsied/removed/  Left sided diverticula. The remainder of the colonic mucosa appeared normal.  . COLONOSCOPY   09/15/01   RMR: Multiple diminutive polyps destroyed with dermolysis as described above/ Multiple small polyps on stalks in the colon resected with snare cautery/ Scattered pan colonic diverticulum/ The remainder of the colonic mucosa appeared normal  . COLONOSCOPY N/A 05/01/2015   Procedure: COLONOSCOPY;  Surgeon: Daneil Dolin, MD;  [facility];  Service: Endoscopy;  Laterality: N/A;  1115  . CYSTECTOMY    . CYSTOSCOPY  04/2014   . CYSTOSTOMY W/ BLADDER BIOPSY    . FLEXIBLE SIGMOIDOSCOPY  2014   Dr. Olevia Perches: tubular adenoma, negative stool studies   . IR EXT NEPHROURETERAL CATH EXCHANGE  07/15/2018  . LUNG LOBECTOMY    . TONSILLECTOMY    . TRANSURETHRAL RESECTION OF PROSTATE    . VIDEO BRONCHOSCOPY WITH ENDOBRONCHIAL NAVIGATION N/A 10/10/2013   Procedure: VIDEO BRONCHOSCOPY WITH ENDOBRONCHIAL NAVIGATION;  Surgeon: Melrose Nakayama, MD;  Location: [place];  Service: Thoracic;  Laterality: N/A;  NO BLOOD THINNERS BUT PATIENT HAS ITP  . WEDGE RESECTION  04/2011   carcinoma of lung    Social History   Socioeconomic History  . Marital status: Married    Spouse name: [name]   . Number of children: 2  . Years of education: 12+  . Highest education level: Not on file  Occupational History  . Occupation: Retired     Fish farm manager: [employer]  Social Needs  . Financial resource strain: Not on file  . Food insecurity:    Worry: Not on file    Inability: Not on file  . Transportation needs:    Medical: Not on file    Non-medical: Not on file  Tobacco Use  . Smoking status: Former Smoker    Packs/day: 1.00    Years: 50.00    Pack years: 50.00    Types: Cigarettes    Start date: 07/22/1955  . Smokeless tobacco: Never Used  . Tobacco comment: "smokes a few days a week" 09/29/17  Substance and Sexual Activity  . Alcohol use: No    Alcohol/week: 0.0 standard drinks  . Drug use: No  . Sexual activity: Never  Lifestyle  . Physical activity:    Days per week: Not on file    Minutes per session: Not on file  . Stress: Not on file  Relationships  . Social connections:    Talks on phone: Not on file    Gets together: Not on file    Attends religious service: Not on file    Active member of club or organization: Not on file    Attends meetings of clubs or organizations: Not on file    Relationship status: Not on file  . Intimate partner violence:    Fear of current or ex partner: Not on file    Emotionally  abused: Not on file    Physically abused: Not on file    Forced sexual activity: Not on file  Other Topics Concern  . Not on file  Social History Narrative   Patient lives at home with his wife [name].    Patient has 2 children.    Patient is retired.    Patient is right handed.    Patient has 2 years of college.    3-4 cups of caffeine daily.   Family History  Problem Relation Age of Onset  . Aortic aneurysm Mother   . Emphysema Father  smoker  . Clotting disorder Father   . Arthritis Father   . Hypertension Father   . Diabetes Father   . Aortic aneurysm Father   . Tremor Father   . Stroke Paternal Grandmother   . Other Paternal Grandfather        brain aneurysm  . Colon cancer Cousin       VITAL SIGNS BP 114/69   Pulse 79   Temp 98.4 F (36.9 C)   Resp 18   Ht 6' (1.829 m)   Wt 249 lb (112.9 kg)   SpO2 99%   BMI 33.77 kg/m   Outpatient Encounter Medications as of 09/13/2018  Medication Sig  . Acetylcysteine (NAC PO) Take 1 capsule by mouth 2 (two) times daily. 600 mg  . albuterol (PROAIR HFA) 108 (90 Base) MCG/ACT inhaler INHALE 2 PUFFS INTO THE LUNGS 4 TIMES DAILY AS NEEDED.  Marland Kitchen ALPRAZolam (XANAX) 0.5 MG tablet Take 1 tablet (0.5 mg total) by mouth at bedtime.  Marland Kitchen aspirin EC 81 MG tablet Take 81 mg by mouth every morning.   . budesonide-formoterol (SYMBICORT) 160-4.5 MCG/ACT inhaler Inhale 2 puffs into the lungs 2 (two) times daily.  . cholecalciferol (VITAMIN D) 1000 units tablet Take 2,000 Units by mouth every morning.   . citalopram (CELEXA) 20 MG tablet TAKE ONE (1) TABLET BY MOUTH EVERY DAY  . clopidogrel (PLAVIX) 75 MG tablet Take 75 mg by mouth every morning.  . Cyanocobalamin (B-12) 2500 MCG TABS Take 1 tablet by mouth every morning.  Marland Kitchen doxycycline (VIBRAMYCIN) 100 MG capsule Take 100 mg by mouth 2 (two) times daily.  . fluticasone (FLONASE) 50 MCG/ACT nasal spray Place 1 spray into both nostrils daily.  Marland Kitchen glucosamine-chondroitin 500-400 MG tablet  Take 1 tablet by mouth 2 (two) times daily.  Marland Kitchen ipratropium-albuterol (DUONEB) 0.5-2.5 (3) MG/3ML SOLN Take 3 mLs by nebulization every 6 (six) hours. 07/09/19 - 09/15/18 then give every 6 hours as needed starting on Sep 25, 2018  . magnesium hydroxide (MILK OF MAGNESIA) 400 MG/5ML suspension Take 30 mLs by mouth daily as needed for mild constipation.  . metoprolol tartrate (LOPRESSOR) 25 MG tablet Take 25 mg by mouth 2 (two) times daily.  . niacin (SLO-NIACIN) 500 MG tablet Take 1,000 mg by mouth at bedtime.  . nitroGLYCERIN (NITROSTAT) 0.4 MG SL tablet Place 1 tablet (0.4 mg total) under the tongue every 5 (five) minutes as needed. Call MD if need more than 2  . NON FORMULARY CPAP while sleeping with Oxygen bleed in at @@ 2.5 L/min  . NON FORMULARY Diet Type:  [name], consistent Carbohydrate  . OLANZapine (ZYPREXA) 5 MG tablet Take 5 mg by mouth every evening.   . OXYGEN Inhale into the lungs. Taper Oxygen to maintain Stat above 90  . pravastatin (PRAVACHOL) 80 MG tablet TAKE ONE (1) TABLET EACH DAY  . Probiotic Product (PROBIOTIC PEARLS ADVANTAGE) CAPS Take 1 capsule by mouth daily.  . sodium bicarbonate 650 MG tablet Take 1,300 mg by mouth 2 (two) times daily.  Marland Kitchen tiotropium (SPIRIVA HANDIHALER) 18 MCG inhalation capsule PLACE ONE CAPSULE INTO INHALER AND INHALE DAILY  . [DISCONTINUED] ipratropium-albuterol (DUONEB) 0.5-2.5 (3) MG/3ML SOLN Take 3 mLs by nebulization every 4 (four) hours as needed. (Patient not taking: Reported on 09/01/2018)   No facility-administered encounter medications on file as of 08/25/2018.      SIGNIFICANT DIAGNOSTIC EXAMS  LABS REVIEWED TODAY:   09-10-18: wbc 8.8; hgb 8.1; hct 28.2; mcv 103.3; plt 53 glucose 110; bun 58  creat 3.41 k+ 5.1; na++ 140 ca 8.4 liver normal albumin 2.6  Review of Systems  Constitutional: Positive for malaise/fatigue. Negative for fever.  HENT: Negative for sore throat.   Respiratory: Positive for cough and shortness of breath. Negative for  sputum production.   Cardiovascular: Negative for chest pain, palpitations and leg swelling.  Gastrointestinal: Negative for abdominal pain, constipation and heartburn.  Musculoskeletal: Negative for back pain, joint pain and myalgias.  Skin: Negative.   Neurological: Negative for dizziness.  Psychiatric/Behavioral: The patient is not nervous/anxious.     Physical Exam Constitutional:      General: He is not in acute distress.    Appearance: He is well-developed. He is not diaphoretic.  Neck:     Musculoskeletal: Neck supple.     Thyroid: No thyromegaly.  Cardiovascular:     Rate and Rhythm: Normal rate and regular rhythm.     Pulses: Normal pulses.     Heart sounds: Normal heart sounds.  Pulmonary:     Effort: Pulmonary effort is normal. No respiratory distress.     Breath sounds: Wheezing present.     Comments: Few scattered wheezes;  Abdominal:     General: Bowel sounds are normal. There is no distension.     Palpations: Abdomen is soft.     Tenderness: There is no abdominal tenderness.  Musculoskeletal: Normal range of motion.     Right lower leg: No edema.     Left lower leg: No edema.  Lymphadenopathy:     Cervical: No cervical adenopathy.  Skin:    General: Skin is warm and dry.     Comments: Right nephrostomy stent without signs of infection present.    Neurological:     Mental Status: He is alert and oriented to person, place, and time.  Psychiatric:        Mood and Affect: Mood normal.     ASSESSMENT/ PLAN:  TODAY:   1. Benign essential HTN: is stable b/p 114/69: is stable will continue lopressor 25 mg twice daily   2. Coronary artery disease of native artery of native heart with stable angina pectoris: is stable will continue lopressor 25 mg twice daily asa 81 mg daily and plavix 75 mg daily has prn ntg   3.obstrcutive sleep apnea: is stable uses cpap  4. COPD exacerbation/ adenocarcinoma of right lung (stage 1A(T1b, N0, Mo)): 02 dependent; will  continue symbicort 160/4.35mcg 2 puffs twice daily spiriva 18 mcg daily albuterol 2 puffs every 6 hours as needed; duoneb every 6 hours through 09-15-18 then every 6 hours as needed  5. Unspecified seasonal allergic rhinitis: is stable will continue flonase daily   6. HCAP: is without change will complete abt and will monitor his status  7. Stage 4 chronic kidney disease: is stable will continue sodium bicarb 1300 mg twice daily   8. Dyslipidemia: is stable will continue pravachol 80 mg daily   9. Major depression with psychotic features: is emotionally stable: will continue celexa 20 gm daily and zyprexa 5 mg nightly    MD is aware of resident's narcotic use and is in agreement with current plan of care. We will attempt to wean resident as apropriate   Ok Edwards NP [facility]  Contact [phone] Monday through Friday 8am- 5pm  After hours call [phone]

## 2018-09-09 NOTE — H&P (Signed)
History and Physical    Kyle [name] VVO:[id number] DOB: 1934/05/29 DOA: 09/13/2018  PCP: Mikey Kirschner, MD   Patient coming from: Home.  I have personally briefly reviewed patient's old medical records in [place]  Chief Complaint: Fever and shortness of breath.  HPI: Kyle [name] is a 85 y.o. male with medical history significant of but not limited to AAA, normal gait, adenocarcinoma right lung, right arm fracture, CAD, history of an STEMI, BPH, history of TURP, bilateral cystic renal masses, history of bladder cancer, history of transurethral resection of bladder cancer lesion plus chemotherapy, cataracts, COPD, chronic kidney disease, type 2 diabetes, diplopia, diverticulosis, hypertension, hyperlipidemia, insomnia ITP, nephrolithiasis, obesity, OSA on CPAP, diabetic peripheral polyneuropathy, tobacco use, ventral hernia, recently admitted from 08/27/2018 until 08/31/2018 due to COPD exacerbation and discharged to the [facility] for physical therapy who is now coming to the emergency department due to dyspnea, fever and confusion.  There was concern about the patient having an upper respiratory infection or pneumonia at the facility and he was started on doxycycline.  ED Course: Initial vital signs temperature 101.9 F, pulse 120, respirations 18, blood pressure 158/88 mmHg and O2 sat 96% on nasal cannula oxygen.  He was given acetaminophen 1000 mg p.o. x1, 1000 mL of NS bolus, cefepime, metronidazole and vancomycin.  Blood cultures x2 were drawn prior to antibiotics being infused.  ABG shows FiO2 of 40%, pH of 7.468, PCO2 31.8 and PO2 of 54.1 mmol/L.  Rest of the ABGs within normal limits.  Lactic acid was 2.1 and repeat was 1.5 mmol/L.  White count is 10.9, hemoglobin 9.5 g/dL and platelets 57.  CMP shows a potassium of 5.2 mmol/L.  The rest of the electrolytes are within normal limits.  Bilirubin was 2.5, BUN was 55, creatinine 3.19 and glucose 145 mg/dL.  Albumin was 3.3  g/dL.  Transaminases were unremarkable.  Influenza A and B by PCR was positive for influenza type A.  Chest radiograph shows cardiomegaly with pulmonary vascular congestion and increased in right pleural effusion since previous film.  Please see images and full radiology report for further detail.  Review of Systems: Unable to obtain.   Past Medical History:  Diagnosis Date  . AAA (abdominal aortic aneurysm) ([place]) 2004   s/p repair 2004; 4.3 cm infrarenal in 05/2011  . Abnormality of gait 02/23/2013  . Adenocarcinoma of right lung ([place]) 02/24/2011   Ct A/P 2012:  2cm lung mass RLL PET 2694:  Hypermetabolic RLL mass, no other hypermetabolic areas. TTNA 02/2011:  Adenocarcinoma, markers c/w lung origin Right lower lobe superior segmentectomy. 04/01/2011 Dr. Arlyce Dice   . Adenocarcinoma, lung ([place]) 01/2011   transthoracic FNA; resection of the superior segment of the RLL in 03/2011; negative nodes; no chemotherapy nor radiation planned  . Arm fracture    right arm  . Arteriosclerotic cardiovascular disease (ASCVD) 1973, 12/2010   S/P NSTEMI secondary to distal RCA/PL lesion, tx medically.  EF of  55%-60% per  echo.  . Benign prostatic hypertrophy    s/p transurethral resection of the prostate  . Bilateral renal masses    Cystic, more prominent on CT in 12/2010 than 2007; followed by Dr. Rosana Hoes  . Bladder cancer [facility]) 1996   Transurethral resection of the bladder + chemotherapy/BCG as premed  . Cataract   . Chronic kidney disease    Creatinine 1.4 on discharge 12/20/2100; proteinuria; normal renal ultrasound in 2010; recent creatinines of 1.7-2.; Bilateral cystic renal masses by CT in  2011  . COPD (chronic obstructive pulmonary disease) ([place])   . Coronary artery disease   . Cough    thick phlegm  . Diabetes mellitus    Type II  . Diplopia 02/23/2013  . Diverticulosis   . Essential and other specified forms of tremor 02/23/2013  . Hx of Clostridium difficile infection   . Hyperlipidemia   . Hypertension    . Insomnia   . ITP (idiopathic thrombocytopenic purpura) 09/07/2012   Chronic ITP of adults versus medication-induced ITP.  Stable  . Myocardial infarction ([place])   . Nephrolithiasis 2012   ARF in 01/2011 due to obstructing nephrolithiasis  . Obesity   . OSA (obstructive sleep apnea)   . Polyneuropathy in diabetes(357.2) 02/23/2013  . Thrombocytopenia ([place])   . Tobacco abuse    50-pack-year consumption; quit in 12/2010  . Tubular adenoma of colon   . Ventral hernia     Past Surgical History:  Procedure Laterality Date  . ABDOMINAL AORTIC ANEURYSM REPAIR  2004  . CARDIAC CATHETERIZATION    . COLONOSCOPY  03/19/2010   Dr. Gala Romney -(poor prep) Anal papilla, rectal hyperplastic polyp, tubular adenoma removed splenic flexure, left-sided diverticula  . COLONOSCOPY  11/28/2004   RMR:  Diminutive rectal and left colon polyps as described above, cold  biopsied/removed/  Left sided diverticula. The remainder of the colonic mucosa appeared normal.  . COLONOSCOPY   09/15/01   RMR: Multiple diminutive polyps destroyed with dermolysis as described above/ Multiple small polyps on stalks in the colon resected with snare cautery/ Scattered pan colonic diverticulum/ The remainder of the colonic mucosa appeared normal  . COLONOSCOPY N/A 05/01/2015   Procedure: COLONOSCOPY;  Surgeon: Daneil Dolin, MD;  [facility];  Service: Endoscopy;  Laterality: N/A;  1115  . CYSTECTOMY    . CYSTOSCOPY  04/2014  . CYSTOSTOMY W/ BLADDER BIOPSY    . FLEXIBLE SIGMOIDOSCOPY  2014   Dr. Olevia Perches: tubular adenoma, negative stool studies   . IR EXT NEPHROURETERAL CATH EXCHANGE  07/15/2018  . LUNG LOBECTOMY    . TONSILLECTOMY    . TRANSURETHRAL RESECTION OF PROSTATE    . VIDEO BRONCHOSCOPY WITH ENDOBRONCHIAL NAVIGATION N/A 10/10/2013   Procedure: VIDEO BRONCHOSCOPY WITH ENDOBRONCHIAL NAVIGATION;  Surgeon: Melrose Nakayama, MD;  Location: [place];  Service: Thoracic;  Laterality: N/A;  NO BLOOD THINNERS BUT PATIENT  HAS ITP  . WEDGE RESECTION  04/2011   carcinoma of lung     reports that he has quit smoking. His smoking use included cigarettes. He started smoking about 63 years ago. He has a 50.00 pack-year smoking history. He has never used smokeless tobacco. He reports that he does not drink alcohol or use drugs.  Allergies  Allergen Reactions  . Codeine Anaphylaxis  . Etodolac Other (See Comments)    dizziness  . Zanaflex [Tizanidine Hcl] Other (See Comments)    Drowsiness    Family History  Problem Relation Age of Onset  . Aortic aneurysm Mother   . Emphysema Father        smoker  . Clotting disorder Father   . Arthritis Father   . Hypertension Father   . Diabetes Father   . Aortic aneurysm Father   . Tremor Father   . Stroke Paternal Grandmother   . Other Paternal Grandfather        brain aneurysm  . Colon cancer Cousin    Prior to Admission medications   Medication Sig Start Date End Date Taking? Authorizing  Provider  Acetylcysteine (NAC PO) Take 1 capsule by mouth 2 (two) times daily. 600 mg   Yes [provider]  albuterol (PROAIR HFA) 108 (90 Base) MCG/ACT inhaler INHALE 2 PUFFS INTO THE LUNGS 4 TIMES DAILY AS NEEDED. 07/28/18  Yes Juanito Doom, MD  ALPRAZolam Duanne Moron) 0.5 MG tablet Take 1 tablet (0.5 mg total) by mouth at bedtime. 09/01/18  Yes Granville Lewis C, PA-C  aspirin EC 81 MG tablet Take 81 mg by mouth every morning.    Yes [provider]  budesonide-formoterol (SYMBICORT) 160-4.5 MCG/ACT inhaler Inhale 2 puffs into the lungs 2 (two) times daily. 08/31/18  Yes Barton Dubois, MD  cholecalciferol (VITAMIN D) 1000 units tablet Take 2,000 Units by mouth every morning.    Yes [provider]  citalopram (CELEXA) 20 MG tablet TAKE ONE (1) TABLET BY MOUTH EVERY DAY 08/11/18  Yes Mikey Kirschner, MD  clopidogrel (PLAVIX) 75 MG tablet Take 75 mg by mouth every morning.   Yes [provider]  Cyanocobalamin (B-12) 2500 MCG TABS Take 1  tablet by mouth every morning.   Yes [provider]  doxycycline (VIBRAMYCIN) 100 MG capsule Take 100 mg by mouth 2 (two) times daily. 09/08/18 09/15/18 Yes [provider]  fluticasone (FLONASE) 50 MCG/ACT nasal spray Place 1 spray into both nostrils daily. 11/09/17  Yes Mikey Kirschner, MD  glucosamine-chondroitin 500-400 MG tablet Take 1 tablet by mouth 2 (two) times daily.   Yes [provider]  ipratropium-albuterol (DUONEB) 0.5-2.5 (3) MG/3ML SOLN Take 3 mLs by nebulization every 6 (six) hours. 07/09/19 - 09/15/18 then give every 6 hours as needed starting on 10/05/2018   Yes [provider]  metoprolol tartrate (LOPRESSOR) 25 MG tablet Take 25 mg by mouth 2 (two) times daily.   Yes [provider]  niacin (SLO-NIACIN) 500 MG tablet Take 1,000 mg by mouth at bedtime.   Yes [provider]  nitroGLYCERIN (NITROSTAT) 0.4 MG SL tablet Place 1 tablet (0.4 mg total) under the tongue every 5 (five) minutes as needed. Call MD if need more than 2 04/18/16  Yes Mikey Kirschner, MD  NON FORMULARY CPAP while sleeping with Oxygen bleed in at @@ 2.5 L/min 08/31/18  Yes [provider]  NON FORMULARY Diet Type:  [name], consistent Carbohydrate 08/31/18  Yes [provider]  OLANZapine (ZYPREXA) 5 MG tablet Take 5 mg by mouth every evening.    Yes [provider]  OXYGEN Inhale into the lungs. Taper Oxygen to maintain Stat above 90 09/02/18  Yes [provider]  pravastatin (PRAVACHOL) 80 MG tablet TAKE ONE (1) TABLET EACH DAY 08/11/18  Yes Mikey Kirschner, MD  Probiotic Product (PROBIOTIC PEARLS ADVANTAGE) CAPS Take 1 capsule by mouth daily.   Yes [provider]  sodium bicarbonate 650 MG tablet Take 1,300 mg by mouth 2 (two) times daily.   Yes [provider]  tiotropium (SPIRIVA HANDIHALER) 18 MCG inhalation capsule PLACE ONE CAPSULE INTO INHALER AND INHALE DAILY 07/23/18  Yes Kathyrn Drown, MD     Physical Exam: Vitals:   09/01/2018 1900 08/29/2018 1915 08/21/2018 1943 08/22/2018 2000  BP:   (!) 147/95 (!) 101/51  Pulse: (!) 123 (!) 121 (!) 119 (!) 109  Resp: (!) 27 14 (!) 29 (!) 27  Temp:      TempSrc:      SpO2: 94% 96% 99% 95%  Weight:      Height:  Constitutional: Somnolent, on BiPAP, mildly febrile. Eyes: PERRL, sclerae, lids and conjunctivae mildly injected. ENMT: BiPAP mask on.  Mucous membranes are dry.  Posterior pharynx unable to exam due to BiPAP mask. Neck: normal, supple, no masses, no thyromegaly Respiratory: Tachypneic 27 bpm, no accessory muscle use.  Subtle rhonchi, no wheezing, no Rales. Cardiovascular: Regular rate and rhythm at 96 bpm, no murmurs / rubs / gallops.  Trace lower extremities pitting edema. 2+ pedal pulses. No carotid bruits.  Abdomen: Obese, positive ventral hernia, soft, no tenderness, no masses palpated. No hepatosplenomegaly. Bowel sounds positive.  Musculoskeletal: no clubbing / cyanosis. . Good ROM, no contractures. Normal muscle tone.  Skin: Multiple areas of ecchymosis on forearms. Neurologic: Moves all extremities, but unable to fully evaluate. Psychiatric: Somnolent.  Wakes up briefly.  Seems confused.  Labs on Admission: I have personally reviewed following labs and imaging studies  CBC: Recent Labs  Lab 09/06/18 0928 09/03/2018 1854  WBC 5.9 10.9*  NEUTROABS 4.4 9.7*  HGB 8.6* 9.5*  HCT 29.8* 31.6*  MCV 102.8* 102.9*  PLT 65* 57*   Basic Metabolic Panel: Recent Labs  Lab 09/07/18 0700 08/29/2018 1854  NA 144 138  K 4.4 5.2*  CL 112* 106  CO2 24 22  GLUCOSE 98 145*  BUN 58* 55*  CREATININE 2.84* 3.19*  CALCIUM 8.8* 9.0   GFR: Estimated Creatinine Clearance: 23.2 mL/min (A) (by C-G formula based on SCr of 3.19 mg/dL (H)). Liver Function Tests: Recent Labs  Lab 09/04/2018 1854  AST 14*  ALT 14  ALKPHOS 54  BILITOT 2.5*  PROT 6.5  ALBUMIN 3.3*   No results for input(s): LIPASE, AMYLASE in the last 168  hours. No results for input(s): AMMONIA in the last 168 hours. Coagulation Profile: No results for input(s): INR, PROTIME in the last 168 hours. Cardiac Enzymes: No results for input(s): CKTOTAL, CKMB, CKMBINDEX, TROPONINI in the last 168 hours. BNP (last 3 results) No results for input(s): PROBNP in the last 8760 hours. HbA1C: No results for input(s): HGBA1C in the last 72 hours. CBG: No results for input(s): GLUCAP in the last 168 hours. Lipid Profile: No results for input(s): CHOL, HDL, LDLCALC, TRIG, CHOLHDL, LDLDIRECT in the last 72 hours. Thyroid Function Tests: No results for input(s): TSH, T4TOTAL, FREET4, T3FREE, THYROIDAB in the last 72 hours. Anemia Panel: No results for input(s): VITAMINB12, FOLATE, FERRITIN, TIBC, IRON, RETICCTPCT in the last 72 hours. Urine analysis:    Component Value Date/Time   COLORURINE YELLOW 09/03/2018 1918   APPEARANCEUR HAZY (A) 09/03/2018 1918   LABSPEC 1.012 09/03/2018 1918   PHURINE 6.0 09/03/2018 1918   GLUCOSEU >=500 (A) 09/03/2018 1918   HGBUR LARGE (A) 09/03/2018 1918   BILIRUBINUR NEGATIVE 09/03/2018 1918   KETONESUR NEGATIVE 09/03/2018 1918   PROTEINUR 100 (A) 09/03/2018 1918   UROBILINOGEN 1.0 10/06/2013 1527   NITRITE NEGATIVE 09/03/2018 1918   LEUKOCYTESUR TRACE (A) 09/03/2018 1918    Radiological Exams on Admission: Dg Chest Port 1 View  Result Date: 09/11/2018 CLINICAL DATA:  Dyspnea and fever EXAM: PORTABLE CHEST 1 VIEW COMPARISON:  08/27/2018 FINDINGS: Interval increase in moderate right effusion obscuring the right hemidiaphragm and portions of the right heart border. Pulmonary vascular congestion is noted. Stable cardiomegaly with tortuous atherosclerotic aorta is identified. Atelectasis at the left lung base is noted. No acute nor suspicious osseous abnormalities. IMPRESSION: Cardiomegaly with pulmonary vascular congestion and increase in right effusion since prior. Electronically Signed   By: Ashley Royalty M.D.   On:  09/08/2018 19:16    EKG: Independently reviewed.  Vent. rate 124 BPM PR interval * ms QRS duration 111 ms QT/QTc 311/447 ms P-R-T axes * -52 104 Sinus tachycardia Left axis deviation Abnormal R-wave progression, late transition Repol abnrm suggests ischemia, lateral leads Left bundle branch block  Assessment/Plan Principal Problem:   Acute respiratory failure with hypoxemia (HCC)   COPD exacerbation (HCC) Continue supplemental oxygen. Continue BiPAP ventilation. Scheduled and as needed bronchodilators. Single dose Solu-Medrol 40 mg IVP x1. Tamiflu for influenza. Continue IV antibacterial antibiotics for now.  Active Problems:   Influenza A Continue treatment as above. Tamiflu 30 mg p.o. daily.    Stage IV CKD. Renal function seems to be stable. Judicious use of IV fluids. Monitor intake and output. Follow-up renal function and electrolytes.    Hyperkalemia Should have resolved with IV hydration. Follow-up potassium level.    Type 2 diabetes mellitus. Hemoglobin A1c was 5.2% on 08/28/2018. Carbohydrate modified diet. CBG monitoring with regular insulin sliding scale.    CAD (coronary artery disease) Continue aspirin, clopidogrel and pravastatin.    Benign essential HTN Hold antihypertensives for now. Monitor blood pressure.    Tobacco abuse Nicotine replacement therapy as needed. Staff to provide tobacco cessation information.    Bilirubinemia Monitor hepatic function test. Check RUQ [place].    Depression Continue citalopram 20 mg p.o. daily.    Thrombocytopenia ([place]) Secondary to ITP. Follow-up platelet level.    Gastroesophageal reflux disease Famotidine 20 mg p.o. daily.    DVT prophylaxis: SCDs. Code Status: Full code. Family Communication: His son [name] was present in the ED room. Disposition Plan: Admit for acute respiratory failure due to influenza A. Consults called:  Admission status: Inpatient/stepdown.   Reubin Milan MD [facility]  08/25/2018, 8:27 PM   This document was prepared using Dragon voice recognition software and may contain some unintended transcription errors.

## 2018-09-09 NOTE — Progress Notes (Signed)
Pharmacy Antibiotic Note  Kyle [name] is a 85 y.o. male admitted on 08/31/2018 with sepsis.  Pharmacy has been consulted for vancomycin and cefepime dosing.  Plan: Vancomycin 1500mg  IV every 48 hours.  Goal trough 15-20 mcg/mL. cefepime 2gm iv q24h  Height: 6' (182.9 cm) Weight: 249 lb (112.9 kg) IBW/kg (Calculated) : 77.6  Temp (24hrs), Avg:100.2 F (37.9 C), Min:98.4 F (36.9 C), Max:101.9 F (38.8 C)  Recent Labs  Lab 09/06/18 0928 09/07/18 0700 09/08/2018 1854  WBC 5.9  --   --   CREATININE  --  2.84* 3.19*  LATICACIDVEN  --   --  2.1*    Estimated Creatinine Clearance: 23.2 mL/min (A) (by C-G formula based on SCr of 3.19 mg/dL (H)).    Allergies  Allergen Reactions  . Codeine Anaphylaxis  . Etodolac Other (See Comments)    dizziness  . Zanaflex [Tizanidine Hcl] Other (See Comments)    Drowsiness    Antimicrobials this admission: 2/20 vancomycin >>  2/20 cefepime >>   Microbiology results: 2/20 BCx: sent  Thank you for allowing pharmacy to be a part of this patient's care.  Donna Christen Mardie Kellen 09/08/2018 7:41 PM

## 2018-09-10 ENCOUNTER — Encounter (HOSPITAL_COMMUNITY): Payer: Self-pay | Admitting: *Deleted

## 2018-09-10 ENCOUNTER — Other Ambulatory Visit: Payer: Self-pay

## 2018-09-10 DIAGNOSIS — D696 Thrombocytopenia, unspecified: Secondary | ICD-10-CM

## 2018-09-10 DIAGNOSIS — E119 Type 2 diabetes mellitus without complications: Secondary | ICD-10-CM

## 2018-09-10 DIAGNOSIS — J101 Influenza due to other identified influenza virus with other respiratory manifestations: Secondary | ICD-10-CM

## 2018-09-10 DIAGNOSIS — I10 Essential (primary) hypertension: Secondary | ICD-10-CM

## 2018-09-10 DIAGNOSIS — K219 Gastro-esophageal reflux disease without esophagitis: Secondary | ICD-10-CM

## 2018-09-10 DIAGNOSIS — E875 Hyperkalemia: Secondary | ICD-10-CM

## 2018-09-10 DIAGNOSIS — J441 Chronic obstructive pulmonary disease with (acute) exacerbation: Secondary | ICD-10-CM

## 2018-09-10 DIAGNOSIS — F3341 Major depressive disorder, recurrent, in partial remission: Secondary | ICD-10-CM

## 2018-09-10 DIAGNOSIS — A419 Sepsis, unspecified organism: Secondary | ICD-10-CM

## 2018-09-10 DIAGNOSIS — G4733 Obstructive sleep apnea (adult) (pediatric): Secondary | ICD-10-CM

## 2018-09-10 LAB — COMPREHENSIVE METABOLIC PANEL
ALT: 12 U/L (ref 0–44)
ANION GAP: 6 (ref 5–15)
AST: 13 U/L — ABNORMAL LOW (ref 15–41)
Albumin: 2.6 g/dL — ABNORMAL LOW (ref 3.5–5.0)
Alkaline Phosphatase: 40 U/L (ref 38–126)
BUN: 58 mg/dL — ABNORMAL HIGH (ref 8–23)
CHLORIDE: 109 mmol/L (ref 98–111)
CO2: 25 mmol/L (ref 22–32)
Calcium: 8.4 mg/dL — ABNORMAL LOW (ref 8.9–10.3)
Creatinine, Ser: 3.41 mg/dL — ABNORMAL HIGH (ref 0.61–1.24)
GFR calc Af Amer: 18 mL/min — ABNORMAL LOW (ref >60)
GFR calc non Af Amer: 16 mL/min — ABNORMAL LOW (ref >60)
Glucose, Bld: 110 mg/dL — ABNORMAL HIGH (ref 70–99)
Potassium: 5.1 mmol/L (ref 3.5–5.1)
Sodium: 140 mmol/L (ref 135–145)
Total Bilirubin: 1.9 mg/dL — ABNORMAL HIGH (ref 0.3–1.2)
Total Protein: 5.3 g/dL — ABNORMAL LOW (ref 6.5–8.1)

## 2018-09-10 LAB — CBC WITH DIFFERENTIAL/PLATELET
Abs Immature Granulocytes: 0.07 10*3/uL (ref 0.00–0.07)
Basophils Absolute: 0 10*3/uL (ref 0.0–0.1)
Basophils Relative: 0 %
EOS ABS: 0 10*3/uL (ref 0.0–0.5)
EOS PCT: 0 %
HCT: 28.2 % — ABNORMAL LOW (ref 39.0–52.0)
Hemoglobin: 8.1 g/dL — ABNORMAL LOW (ref 13.0–17.0)
Immature Granulocytes: 1 %
Lymphocytes Relative: 4 %
Lymphs Abs: 0.3 10*3/uL — ABNORMAL LOW (ref 0.7–4.0)
MCH: 29.7 pg (ref 26.0–34.0)
MCHC: 28.7 g/dL — AB (ref 30.0–36.0)
MCV: 103.3 fL — ABNORMAL HIGH (ref 80.0–100.0)
Monocytes Absolute: 0.6 10*3/uL (ref 0.1–1.0)
Monocytes Relative: 7 %
Neutro Abs: 7.7 10*3/uL (ref 1.7–7.7)
Neutrophils Relative %: 88 %
PLATELETS: 53 10*3/uL — AB (ref 150–400)
RBC: 2.73 MIL/uL — ABNORMAL LOW (ref 4.22–5.81)
RDW: 17.7 % — AB (ref 11.5–15.5)
WBC: 8.8 10*3/uL (ref 4.0–10.5)
nRBC: 0 % (ref 0.0–0.2)

## 2018-09-10 LAB — MRSA PCR SCREENING: MRSA BY PCR: NEGATIVE

## 2018-09-10 MED ORDER — VITAMIN D 25 MCG (1000 UNIT) PO TABS
2000.0000 [IU] | ORAL_TABLET | Freq: Every morning | ORAL | Status: DC
  Administered 2018-09-10 – 2018-09-15 (×6): 2000 [IU] via ORAL
  Filled 2018-09-10 (×6): qty 2

## 2018-09-10 MED ORDER — SODIUM CHLORIDE 0.9 % IV SOLN
1.0000 g | INTRAVENOUS | Status: DC
  Administered 2018-09-11: 1 g via INTRAVENOUS
  Filled 2018-09-10 (×3): qty 1

## 2018-09-10 MED ORDER — BUDESONIDE 0.5 MG/2ML IN SUSP
0.5000 mg | Freq: Two times a day (BID) | RESPIRATORY_TRACT | Status: DC
  Administered 2018-09-10 – 2018-09-14 (×8): 0.5 mg via RESPIRATORY_TRACT
  Filled 2018-09-10 (×8): qty 2

## 2018-09-10 MED ORDER — PREDNISONE 20 MG PO TABS
40.0000 mg | ORAL_TABLET | Freq: Every day | ORAL | Status: DC
  Administered 2018-09-10 – 2018-09-14 (×5): 40 mg via ORAL
  Filled 2018-09-10 (×5): qty 2

## 2018-09-10 MED ORDER — ALPRAZOLAM 0.5 MG PO TABS
0.5000 mg | ORAL_TABLET | Freq: Two times a day (BID) | ORAL | Status: DC | PRN
  Administered 2018-09-10 – 2018-09-13 (×4): 0.5 mg via ORAL
  Filled 2018-09-10 (×4): qty 1

## 2018-09-10 MED ORDER — CLOPIDOGREL BISULFATE 75 MG PO TABS
75.0000 mg | ORAL_TABLET | Freq: Every morning | ORAL | Status: DC
  Administered 2018-09-10 – 2018-09-15 (×6): 75 mg via ORAL
  Filled 2018-09-10 (×6): qty 1

## 2018-09-10 MED ORDER — ACETYLCYSTEINE 600 MG PO CAPS: 600.0000 mg | ORAL_CAPSULE | Freq: Two times a day (BID) | ORAL | Status: DC

## 2018-09-10 MED ORDER — SODIUM BICARBONATE 650 MG PO TABS
1300.0000 mg | ORAL_TABLET | Freq: Two times a day (BID) | ORAL | Status: DC
  Administered 2018-09-10 – 2018-09-15 (×12): 1300 mg via ORAL
  Filled 2018-09-10 (×12): qty 2

## 2018-09-10 MED ORDER — ASPIRIN EC 81 MG PO TBEC
81.0000 mg | DELAYED_RELEASE_TABLET | Freq: Every morning | ORAL | Status: DC
  Administered 2018-09-10 – 2018-09-15 (×6): 81 mg via ORAL
  Filled 2018-09-10 (×6): qty 1

## 2018-09-10 MED ORDER — CITALOPRAM HYDROBROMIDE 20 MG PO TABS
20.0000 mg | ORAL_TABLET | Freq: Every day | ORAL | Status: DC
  Administered 2018-09-10 – 2018-09-15 (×6): 20 mg via ORAL
  Filled 2018-09-10 (×6): qty 1

## 2018-09-10 MED ORDER — NIACIN ER 500 MG PO TBCR
1000.0000 mg | EXTENDED_RELEASE_TABLET | Freq: Every day | ORAL | Status: DC
  Administered 2018-09-11 – 2018-09-14 (×4): 1000 mg via ORAL
  Filled 2018-09-10 (×7): qty 2

## 2018-09-10 MED ORDER — NITROGLYCERIN 0.4 MG SL SUBL: 0.4000 mg | SUBLINGUAL_TABLET | SUBLINGUAL | Status: DC | PRN

## 2018-09-10 MED ORDER — OLANZAPINE 5 MG PO TABS
5.0000 mg | ORAL_TABLET | Freq: Every evening | ORAL | Status: DC
  Administered 2018-09-10 – 2018-09-14 (×5): 5 mg via ORAL
  Filled 2018-09-10 (×5): qty 1

## 2018-09-10 MED ORDER — PRAVASTATIN SODIUM 40 MG PO TABS
80.0000 mg | ORAL_TABLET | Freq: Every day | ORAL | Status: DC
  Administered 2018-09-10 – 2018-09-15 (×6): 80 mg via ORAL
  Filled 2018-09-10 (×6): qty 2

## 2018-09-10 MED ORDER — METOPROLOL TARTRATE 25 MG PO TABS
25.0000 mg | ORAL_TABLET | Freq: Two times a day (BID) | ORAL | Status: DC
  Administered 2018-09-10 – 2018-09-11 (×5): 25 mg via ORAL
  Filled 2018-09-10 (×4): qty 1

## 2018-09-10 MED ORDER — ALPRAZOLAM 0.5 MG PO TABS
0.5000 mg | ORAL_TABLET | Freq: Every evening | ORAL | Status: DC | PRN
  Administered 2018-09-10: 0.5 mg via ORAL
  Filled 2018-09-10: qty 1

## 2018-09-10 NOTE — Progress Notes (Signed)
PROGRESS NOTE    Kyle [name]  FEO:[id number] DOB: Aug 14, 1933 DOA: 09/14/2018 PCP: Mikey Kirschner, MD     Brief Narrative:  85 y.o. male with medical history significant of but not limited to AAA, normal gait, adenocarcinoma right lung, right arm fracture, CAD, history of an STEMI, BPH, history of TURP, bilateral cystic renal masses, history of bladder cancer, history of transurethral resection of bladder cancer lesion plus chemotherapy, cataracts, COPD, chronic kidney disease, type 2 diabetes, diplopia, diverticulosis, hypertension, hyperlipidemia, insomnia ITP, nephrolithiasis, obesity, OSA on CPAP, diabetic peripheral polyneuropathy, tobacco use, ventral hernia, recently admitted from 08/27/2018 until 08/31/2018 due to COPD exacerbation and discharged to the [facility] for physical therapy who is now coming to the emergency department due to dyspnea, fever and confusion.  There was concern about the patient having an upper respiratory infection or pneumonia at the facility and he was started on doxycycline.  ED Course: Initial vital signs temperature 101.9 F, pulse 120, respirations 18, blood pressure 158/88 mmHg and O2 sat 96% on nasal cannula oxygen.  He was given acetaminophen 1000 mg p.o. x1, 1000 mL of NS bolus, cefepime, metronidazole and vancomycin.  Blood cultures x2 were drawn prior to antibiotics being infused.  ABG shows FiO2 of 40%, pH of 7.468, PCO2 31.8 and PO2 of 54.1 mmol/L.  Rest of the ABGs within normal limits.  Lactic acid was 2.1 and repeat was 1.5 mmol/L.  White count is 10.9, hemoglobin 9.5 g/dL and platelets 57.  CMP shows a potassium of 5.2 mmol/L.  The rest of the electrolytes are within normal limits.  Bilirubin was 2.5, BUN was 55, creatinine 3.19 and glucose 145 mg/dL.  Albumin was 3.3 g/dL.  Transaminases were unremarkable.  Influenza A and B by PCR was positive for influenza type A.  Chest radiograph shows cardiomegaly with pulmonary vascular congestion and  increased in right pleural effusion since previous film.  Please see images and full radiology report for further detail.   Assessment & Plan: 1-acute respiratory failure with hypoxemia: In the setting of COPD exacerbation and influenza a -Patient required to be on BiPAP support on admission; now improved and tolerating nasal cannula supplementation. -Of note, during recent hospitalization patient required to be discharged on oxygen supplementation using 2.5 L nasal cannula. -Will continue to wean down as tolerated. -Continue steroids, Xopenex, Atrovent and is*Pulmicort. -Continue treatment with Tamiflu for influenza -Will discontinue vancomycin given negative MRSA PCR and continue for now IV cefepime.  2-influenza A -Continue treatment with Tamiflu -Droplet precautions in place  3-stage IV chronic kidney disease -Renal function appears to be stable -At this moment will discontinue IV fluids -Monitor renal function trend and follow electrolytes.  4-mild hyperkalemia -K at 5.2 on admission -Resolved and is stable now -Continue to follow electrolytes trend.  5-type 2 diabetes mellitus -Continue sliding scale insulin; given use of steroids. -Modified carbohydrate diet has been ordered -Most recent A1c 5.2  6-history of coronary artery disease -Patient denies chest pain -Continue aspirin, Plavix and statins.  7-history of hyperlipidemia -Continue statins.  8-benign essential hypertension -Blood pressure soft on admission -Will hold blood pressure meds for now and resume once vital signs stable.  9-GERD -continue Pepcid   10-depression/anxiety -Continue the use of Celexa -Continue as needed Xanax.  11-obstructive sleep apnea -Continue the use of CPAP nightly.  12-chronic thrombocytopenia -Will continue monitoring platelets count -Avoid heparin drip -SCDs for DVT prophylaxis.  13-sepsis: Due to influenza -Sepsis criteria met on admission -Rapidly improved and  currently  with resolution of sepsis physiology. -Continue treatment as above for culprit factors.  DVT prophylaxis: SCDs Code Status: DNR Family Communication: Son and wife at bedside. Disposition Plan: Remains in a stepdown, given negative MRSA PCR will discontinue vancomycin.  Continue cefepime,start prednisone 40 mg daily, start Pulmicort, continue Xopenex and Atrovent.  Continue oxygen supplementation and wean down as tolerated.  We will continue treatment with Tamiflu.  Consultants:   None  Procedures:   See below for x-ray reports.  Antimicrobials:  Anti-infectives (From admission, onward)   Start     Dose/Rate Route Frequency Ordered Stop   09/11/18 1900  vancomycin (VANCOCIN) 1,500 mg in sodium chloride 0.9 % 500 mL IVPB  Status:  Discontinued     1,500 mg 250 mL/hr over 120 Minutes Intravenous Every 48 hours 09/10/2018 1939 09/10/18 1005   09/10/18 1900  ceFEPIme (MAXIPIME) 2 g in sodium chloride 0.9 % 100 mL IVPB  Status:  Discontinued     2 g 200 mL/hr over 30 Minutes Intravenous Every 24 hours 08/22/2018 1939 09/10/18 0749   09/10/18 1900  ceFEPIme (MAXIPIME) 1 g in sodium chloride 0.9 % 100 mL IVPB     1 g 200 mL/hr over 30 Minutes Intravenous Every 24 hours 09/10/18 0749     09/01/2018 2200  oseltamivir (TAMIFLU) capsule 30 mg  Status:  Discontinued     30 mg Oral 2 times daily 08/29/2018 2150 09/17/2018 2155   09/06/2018 2200  oseltamivir (TAMIFLU) capsule 30 mg     30 mg Oral Daily at bedtime 09/07/2018 2155     09/11/2018 2145  oseltamivir (TAMIFLU) capsule 75 mg  Status:  Discontinued     75 mg Oral 2 times daily 09/08/2018 2130 08/24/2018 2150   09/08/2018 1930  vancomycin (VANCOCIN) IVPB 1000 mg/200 mL premix     1,000 mg 200 mL/hr over 60 Minutes Intravenous  Once 08/24/2018 1926 08/27/2018 2145   08/23/2018 1845  ceFEPIme (MAXIPIME) 2 g in sodium chloride 0.9 % 100 mL IVPB     2 g 200 mL/hr over 30 Minutes Intravenous  Once 08/26/2018 1832 09/10/18 0018   08/28/2018 1845  metroNIDAZOLE  (FLAGYL) IVPB 500 mg     500 mg 100 mL/hr over 60 Minutes Intravenous Every 8 hours 08/29/2018 1832     08/21/2018 1845  vancomycin (VANCOCIN) IVPB 1000 mg/200 mL premix     1,000 mg 200 mL/hr over 60 Minutes Intravenous  Once 08/31/2018 1832 09/03/2018 2145       Subjective: Off BiPAP, denying chest pain, no nausea, no vomiting, using nasal cannula supplementation, experiencing intermittent coughing and ongoing wheezing.  Patient reports feeling short of breath.  Objective: Vitals:   09/10/18 1500 09/10/18 1600 09/10/18 1606 09/10/18 1700  BP: 132/66 (!) 153/100 (!) 189/146 (!) 182/82  Pulse: 78 86 83 94  Resp: 17 (!) 33 (!) 24 (!) 24  Temp:   97.6 F (36.4 C)   TempSrc:   Oral   SpO2: 98% 96% 96% 94%  Weight:      Height:        Intake/Output Summary (Last 24 hours) at 09/10/2018 1759 Last data filed at 09/10/2018 1400 Gross per 24 hour  Intake 2021.7 ml  Output -  Net 2021.7 ml   Filed Weights   08/22/2018 1812 09/10/18 0200  Weight: 112.9 kg 111 kg    Examination: General exam: Alert, awake, oriented x 3; denies chest pain, no nausea, no vomiting.  Requiring oxygen supplementation and is still reporting feeling  short of breath.  Currently no requiring BiPAP. Respiratory system: Positive expiratory wheezing, diffuse rhonchi, no rales, no using accessory muscles. Cardiovascular system:Rate controlled.  No rubs, no gallops. Gastrointestinal system: Abdomen is nondistended, soft and nontender. No organomegaly or masses felt. Normal bowel sounds heard. Central nervous system: Alert and oriented. No focal neurological deficits. Extremities: No cyanosis or clubbing.  Trace edema bilaterally. Skin: No rashes, no petechiae. Psychiatry: Judgement and insight appear normal. Mood & affect appropriate.    Data Reviewed: I have personally reviewed following labs and imaging studies  CBC: Recent Labs  Lab 09/06/18 0928 09/03/2018 1854 09/10/18 0357  WBC 5.9 10.9* 8.8  NEUTROABS 4.4  9.7* 7.7  HGB 8.6* 9.5* 8.1*  HCT 29.8* 31.6* 28.2*  MCV 102.8* 102.9* 103.3*  PLT 65* 57* 53*   Basic Metabolic Panel: Recent Labs  Lab 09/07/18 0700 09/12/2018 1854 09/10/18 0357  NA 144 138 140  K 4.4 5.2* 5.1  CL 112* 106 109  CO2 '24 22 25  ' GLUCOSE 98 145* 110*  BUN 58* 55* 58*  CREATININE 2.84* 3.19* 3.41*  CALCIUM 8.8* 9.0 8.4*   GFR: Estimated Creatinine Clearance: 21.5 mL/min (A) (by C-G formula based on SCr of 3.41 mg/dL (H)).   Liver Function Tests: Recent Labs  Lab 08/22/2018 1854 09/10/18 0357  AST 14* 13*  ALT 14 12  ALKPHOS 54 40  BILITOT 2.5* 1.9*  PROT 6.5 5.3*  ALBUMIN 3.3* 2.6*   Urine analysis:    Component Value Date/Time   COLORURINE YELLOW 09/03/2018 1918   APPEARANCEUR HAZY (A) 09/03/2018 1918   LABSPEC 1.012 09/03/2018 1918   PHURINE 6.0 09/03/2018 1918   GLUCOSEU >=500 (A) 09/03/2018 1918   HGBUR LARGE (A) 09/03/2018 1918   BILIRUBINUR NEGATIVE 09/03/2018 1918   KETONESUR NEGATIVE 09/03/2018 1918   PROTEINUR 100 (A) 09/03/2018 1918   UROBILINOGEN 1.0 10/06/2013 1527   NITRITE NEGATIVE 09/03/2018 1918   LEUKOCYTESUR TRACE (A) 09/03/2018 1918    Recent Results (from the past 240 hour(s))  Urine Culture     Status: Abnormal   Collection Time: 09/03/18  7:18 PM  Result Value Ref Range Status   Specimen Description   Final    URINE, CLEAN CATCH Performed at [facility], [address], [place], [place] [address]    Special Requests   Final    NONE Performed at [facility], [address] [place], [place] [address]    Culture (A)  Final    <10,000 COLONIES/mL INSIGNIFICANT GROWTH Performed at [facility], [place] [address] [place], [place] [address]    Report Status 09/05/2018 FINAL  Final  Blood Culture (routine x 2)     Status: None (Preliminary result)   Collection Time: 08/26/2018  6:54 PM  Result Value Ref Range Status   Specimen Description BLOOD RIGHT HAND  Final   Special Requests   Final    BOTTLES DRAWN AEROBIC  AND ANAEROBIC Blood Culture adequate volume   Culture   Final    NO GROWTH < 12 HOURS Performed at [facility], [address] [place], [place] [address]    Report Status PENDING  Incomplete  Blood Culture (routine x 2)     Status: None (Preliminary result)   Collection Time: 09/07/2018  6:55 PM  Result Value Ref Range Status   Specimen Description LEFT ANTECUBITAL  Final   Special Requests   Final    BOTTLES DRAWN AEROBIC AND ANAEROBIC Blood Culture adequate volume   Culture  Final    NO GROWTH < 12 HOURS Performed at [facility], [address] [place], [place] [address]    Report Status PENDING  Incomplete  MRSA PCR Screening     Status: None   Collection Time: 09/10/18  8:39 AM  Result Value Ref Range Status   MRSA by PCR NEGATIVE NEGATIVE Final    Comment:        The GeneXpert MRSA Assay (FDA approved for NASAL specimens only), is one component of a comprehensive MRSA colonization surveillance program. It is not intended to diagnose MRSA infection nor to guide or monitor treatment for MRSA infections. Performed at [facility], [address] [place], [place] [address]     Radiology Studies: Dg Chest [facility] 1 View  Result Date: 08/21/2018 CLINICAL DATA:  Dyspnea and fever EXAM: PORTABLE CHEST 1 VIEW COMPARISON:  08/27/2018 FINDINGS: Interval increase in moderate right effusion obscuring the right hemidiaphragm and portions of the right heart border. Pulmonary vascular congestion is noted. Stable cardiomegaly with tortuous atherosclerotic aorta is identified. Atelectasis at the left lung base is noted. No acute nor suspicious osseous abnormalities. IMPRESSION: Cardiomegaly with pulmonary vascular congestion and increase in right effusion since prior. Electronically Signed   By: Ashley Royalty M.D.   On: 09/01/2018 19:16    Scheduled Meds: . aspirin EC  81 mg Oral q morning - 10a  . budesonide (PULMICORT) nebulizer solution  0.5 mg Nebulization BID  . cholecalciferol  2,000  Units Oral q morning - 10a  . citalopram  20 mg Oral Daily  . clopidogrel  75 mg Oral q morning - 10a  . famotidine  20 mg Oral Daily  . ipratropium  0.5 mg Nebulization Q6H  . levalbuterol  0.63 mg Nebulization Q6H  . metoprolol tartrate  25 mg Oral BID  . niacin  1,000 mg Oral QHS  . OLANZapine  5 mg Oral QPM  . oseltamivir  30 mg Oral QHS  . pravastatin  80 mg Oral Daily  . predniSONE  40 mg Oral Q breakfast  . sodium bicarbonate  1,300 mg Oral BID   Continuous Infusions: . sodium chloride 75 mL/hr at 09/10/18 1413  . ceFEPime (MAXIPIME) IV    . metronidazole 500 mg (09/10/18 1747)     LOS: 1 day    Time spent: 35 minutes. Greater than 50% of this time was spent in direct contact with the patient, coordinating care and discussing relevant ongoing clinical issues, including explanation regarding influenza causing sepsis and acute respiratory failure.  Patient has improved and is no longer requiring BiPAP.  Currently afebrile.  Demonstrating a physical exam significant expiratory wheezing and positive rhonchi. No CP, no abd pain, no nausea or vomiting.  Family was at bedside and all questions were answered while updating them about plan of care.     Barton Dubois, MD [facility] Pager [phone]   09/10/2018, 5:59 PM

## 2018-09-11 ENCOUNTER — Inpatient Hospital Stay (HOSPITAL_COMMUNITY): Payer: Medicare Other

## 2018-09-11 LAB — CBC WITH DIFFERENTIAL/PLATELET
Abs Immature Granulocytes: 0.03 10*3/uL (ref 0.00–0.07)
BASOS PCT: 0 %
Basophils Absolute: 0 10*3/uL (ref 0.0–0.1)
Eosinophils Absolute: 0 10*3/uL (ref 0.0–0.5)
Eosinophils Relative: 0 %
HCT: 25.8 % — ABNORMAL LOW (ref 39.0–52.0)
Hemoglobin: 7.5 g/dL — ABNORMAL LOW (ref 13.0–17.0)
Immature Granulocytes: 1 %
Lymphocytes Relative: 4 %
Lymphs Abs: 0.2 10*3/uL — ABNORMAL LOW (ref 0.7–4.0)
MCH: 30.6 pg (ref 26.0–34.0)
MCHC: 29.1 g/dL — AB (ref 30.0–36.0)
MCV: 105.3 fL — ABNORMAL HIGH (ref 80.0–100.0)
Monocytes Absolute: 0.2 10*3/uL (ref 0.1–1.0)
Monocytes Relative: 4 %
NRBC: 0 % (ref 0.0–0.2)
Neutro Abs: 4 10*3/uL (ref 1.7–7.7)
Neutrophils Relative %: 91 %
Platelets: 45 10*3/uL — ABNORMAL LOW (ref 150–400)
RBC: 2.45 MIL/uL — AB (ref 4.22–5.81)
RDW: 17.2 % — ABNORMAL HIGH (ref 11.5–15.5)
WBC: 4.3 10*3/uL (ref 4.0–10.5)

## 2018-09-11 LAB — URINALYSIS, ROUTINE W REFLEX MICROSCOPIC
Bacteria, UA: NONE SEEN
Bilirubin Urine: NEGATIVE
Glucose, UA: 150 mg/dL — AB
Ketones, ur: NEGATIVE mg/dL
Nitrite: NEGATIVE
Protein, ur: 100 mg/dL — AB
Specific Gravity, Urine: 1.013 (ref 1.005–1.030)
pH: 5 (ref 5.0–8.0)

## 2018-09-11 MED ORDER — ORAL CARE MOUTH RINSE
15.0000 mL | Freq: Two times a day (BID) | OROMUCOSAL | Status: DC
  Administered 2018-09-12 – 2018-09-15 (×6): 15 mL via OROMUCOSAL

## 2018-09-11 MED ORDER — AMOXICILLIN-POT CLAVULANATE 500-125 MG PO TABS
1.0000 | ORAL_TABLET | Freq: Two times a day (BID) | ORAL | Status: AC
  Administered 2018-09-11 – 2018-09-15 (×8): 500 mg via ORAL
  Filled 2018-09-11 (×8): qty 1

## 2018-09-11 MED ORDER — CHLORHEXIDINE GLUCONATE 0.12 % MT SOLN
15.0000 mL | Freq: Two times a day (BID) | OROMUCOSAL | Status: DC
  Administered 2018-09-11 – 2018-09-15 (×6): 15 mL via OROMUCOSAL
  Filled 2018-09-11 (×6): qty 15

## 2018-09-11 MED ORDER — FUROSEMIDE 10 MG/ML IJ SOLN
40.0000 mg | Freq: Once | INTRAMUSCULAR | Status: AC
  Administered 2018-09-11: 40 mg via INTRAVENOUS
  Filled 2018-09-11: qty 4

## 2018-09-11 NOTE — Progress Notes (Signed)
Pt eating

## 2018-09-11 NOTE — Progress Notes (Signed)
PROGRESS NOTE    Kyle [name]  YDX:[id number] DOB: 07-Jul-1934 DOA: 08/23/2018 PCP: Mikey Kirschner, MD     Brief Narrative:  85 y.o. male with medical history significant of but not limited to AAA, normal gait, adenocarcinoma right lung, right arm fracture, CAD, history of an STEMI, BPH, history of TURP, bilateral cystic renal masses, history of bladder cancer, history of transurethral resection of bladder cancer lesion plus chemotherapy, cataracts, COPD, chronic kidney disease, type 2 diabetes, diplopia, diverticulosis, hypertension, hyperlipidemia, insomnia ITP, nephrolithiasis, obesity, OSA on CPAP, diabetic peripheral polyneuropathy, tobacco use, ventral hernia, recently admitted from 08/27/2018 until 08/31/2018 due to COPD exacerbation and discharged to the [facility] for physical therapy who is now coming to the emergency department due to dyspnea, fever and confusion.  There was concern about the patient having an upper respiratory infection or pneumonia at the facility and he was started on doxycycline.  ED Course: Initial vital signs temperature 101.9 F, pulse 120, respirations 18, blood pressure 158/88 mmHg and O2 sat 96% on nasal cannula oxygen.  He was given acetaminophen 1000 mg p.o. x1, 1000 mL of NS bolus, cefepime, metronidazole and vancomycin.  Blood cultures x2 were drawn prior to antibiotics being infused.  ABG shows FiO2 of 40%, pH of 7.468, PCO2 31.8 and PO2 of 54.1 mmol/L.  Rest of the ABGs within normal limits.  Lactic acid was 2.1 and repeat was 1.5 mmol/L.  White count is 10.9, hemoglobin 9.5 g/dL and platelets 57.  CMP shows a potassium of 5.2 mmol/L.  The rest of the electrolytes are within normal limits.  Bilirubin was 2.5, BUN was 55, creatinine 3.19 and glucose 145 mg/dL.  Albumin was 3.3 g/dL.  Transaminases were unremarkable.  Influenza A and B by PCR was positive for influenza type A.  Chest radiograph shows cardiomegaly with pulmonary vascular congestion and  increased in right pleural effusion since previous film.  Please see images and full radiology report for further detail.   Assessment & Plan: 1-acute respiratory failure with hypoxemia: In the setting of COPD exacerbation and influenza A infection -Patient is slowly improving; he use BiPAP overnight. -Of note, during recent hospitalization patient required to be discharged on oxygen supplementation using 2.5 L nasal cannula.  Currently requiring 4-5 L nasal cannula. -Will continue to wean down as tolerated. -Continue steroids, Xopenex, Atrovent and Pulmicort. -Continue treatment with Tamiflu for influenza -Vancomycin and cefepime now discontinue. -Continue treatment with Augmentin, for underlying bronchiectasis.  2-influenza A -Continue treatment with Tamiflu -Droplet precautions in place  3-stage IV chronic kidney disease -Renal function appears to be stable -At this moment will discontinue IV fluids -Monitor renal function trend and follow electrolytes.  4-mild hyperkalemia -Potassium within normal limits. -Continue to follow electrolytes trend.  5-type 2 diabetes mellitus -Continue sliding scale insulin; given use of steroids. -Modified carbohydrate diet has been ordered -Most recent A1c 5.2  6-history of coronary artery disease -Patient denies chest pain -Continue aspirin, Plavix and statins.  7-history of hyperlipidemia -Continue statins.  8-benign essential hypertension -Blood pressure soft on admission -Continue monitoring vital signs.  9-GERD -continue Pepcid   10-depression/anxiety -Continue the use of Celexa -Continue as needed Xanax.  11-obstructive sleep apnea -Continue the use of CPAP nightly.  12-chronic thrombocytopenia -Will continue monitoring platelets count -Avoid heparin drip -SCDs for DVT prophylaxis.  13-sepsis: Due to influenza -Sepsis criteria met on admission -Sepsis physiology resolved. -Continue treatment for  influenza/bronchiectasis.  DVT prophylaxis: SCDs Code Status: DNR Family Communication: Son and wife at  bedside. Disposition Plan: Remains in a stepdown for another 24 hours; Continue steroids, Pulmicort, Xopenex/Atrovent and initiate flutter valve.  Continue treatment with Tamiflu and wean oxygen supplementation as tolerated.  Will use CPAP nightly.  Anticipate transfer to floor in a.m. if he remains stable.  Consultants:   None  Procedures:   See below for x-ray reports.  Antimicrobials:  Anti-infectives (From admission, onward)   Start     Dose/Rate Route Frequency Ordered Stop   09/11/18 1900  vancomycin (VANCOCIN) 1,500 mg in sodium chloride 0.9 % 500 mL IVPB  Status:  Discontinued     1,500 mg 250 mL/hr over 120 Minutes Intravenous Every 48 hours 09/04/2018 1939 09/10/18 1005   09/10/18 1900  ceFEPIme (MAXIPIME) 2 g in sodium chloride 0.9 % 100 mL IVPB  Status:  Discontinued     2 g 200 mL/hr over 30 Minutes Intravenous Every 24 hours 09/12/2018 1939 09/10/18 0749   09/10/18 1900  ceFEPIme (MAXIPIME) 1 g in sodium chloride 0.9 % 100 mL IVPB     1 g 200 mL/hr over 30 Minutes Intravenous Every 24 hours 09/10/18 0749     09/15/2018 2200  oseltamivir (TAMIFLU) capsule 30 mg  Status:  Discontinued     30 mg Oral 2 times daily 09/11/2018 2150 08/28/2018 2155   09/11/2018 2200  oseltamivir (TAMIFLU) capsule 30 mg     30 mg Oral Daily at bedtime 08/23/2018 2155     09/03/2018 2145  oseltamivir (TAMIFLU) capsule 75 mg  Status:  Discontinued     75 mg Oral 2 times daily 09/15/2018 2130 09/15/2018 2150   08/26/2018 1930  vancomycin (VANCOCIN) IVPB 1000 mg/200 mL premix     1,000 mg 200 mL/hr over 60 Minutes Intravenous  Once 09/05/2018 1926 08/26/2018 2145   09/17/2018 1845  ceFEPIme (MAXIPIME) 2 g in sodium chloride 0.9 % 100 mL IVPB     2 g 200 mL/hr over 30 Minutes Intravenous  Once 08/26/2018 1832 09/10/18 0018   09/05/2018 1845  metroNIDAZOLE (FLAGYL) IVPB 500 mg     500 mg 100 mL/hr over 60 Minutes  Intravenous Every 8 hours 09/02/2018 1832     09/15/2018 1845  vancomycin (VANCOCIN) IVPB 1000 mg/200 mL premix     1,000 mg 200 mL/hr over 60 Minutes Intravenous  Once 08/27/2018 1832 09/11/2018 2145       Subjective: Still feeling short of breath.  Use BiPAP overnight.  No chest pain, no nausea, no vomiting.  Patient is afebrile.  Still requiring 4-5 L nasal cannula supplementation.  Objective: Vitals:   09/11/18 1300 09/11/18 1400 09/11/18 1410 09/11/18 1413  BP: (!) 92/59 100/68    Pulse: (!) 52 61    Resp: 19 13    Temp:      TempSrc:      SpO2: 100% 100% 100% 100%  Weight:      Height:        Intake/Output Summary (Last 24 hours) at 09/11/2018 1604 Last data filed at 09/11/2018 0844 Gross per 24 hour  Intake 667.16 ml  Output 1500 ml  Net -832.84 ml   Filed Weights   08/24/2018 1812 09/10/18 0200 09/11/18 0500  Weight: 112.9 kg 111 kg 113 kg    Examination: General exam: Alert, awake, oriented x 3; denies chest pain, no nausea, no vomiting.  Patient expressed that he is breathing better; even is still experiencing shortness of breath with exertion and required to be on BiPAP overnight.  He is using 4-5 L  nasal cannula supplementation currently. Respiratory system: Positive expiratory wheezing, positive rhonchi right, no frank crackles.  Normal respiratory effort.   Cardiovascular system:Rate controlled. No murmurs, rubs, gallops. Gastrointestinal system: Abdomen is nondistended, soft and nontender. No organomegaly or masses felt. Normal bowel sounds heard. Central nervous system: Alert and oriented. No focal neurological deficits. Extremities: No cyanosis or clubbing; trace edema bilaterally. Skin: No rashes, no petechiae.  Multiple bruises from recent blood work appreciated on his upper extremities. Psychiatry: Judgement and insight appear normal. Mood & affect appropriate.   Data Reviewed: I have personally reviewed following labs and imaging studies  CBC: Recent Labs    Lab 2018-09-23 0928 08/30/2018 1854 09/10/18 0357 09/11/18 0439  WBC 5.9 10.9* 8.8 4.3  NEUTROABS 4.4 9.7* 7.7 4.0  HGB 8.6* 9.5* 8.1* 7.5*  HCT 29.8* 31.6* 28.2* 25.8*  MCV 102.8* 102.9* 103.3* 105.3*  PLT 65* 57* 53* 45*   Basic Metabolic Panel: Recent Labs  Lab 09/07/18 0700 09/03/2018 1854 09/10/18 0357  NA 144 138 140  K 4.4 5.2* 5.1  CL 112* 106 109  CO2 '24 22 25  ' GLUCOSE 98 145* 110*  BUN 58* 55* 58*  CREATININE 2.84* 3.19* 3.41*  CALCIUM 8.8* 9.0 8.4*   GFR: Estimated Creatinine Clearance: 21.7 mL/min (A) (by C-G formula based on SCr of 3.41 mg/dL (H)).   Liver Function Tests: Recent Labs  Lab 08/27/2018 1854 09/10/18 0357  AST 14* 13*  ALT 14 12  ALKPHOS 54 40  BILITOT 2.5* 1.9*  PROT 6.5 5.3*  ALBUMIN 3.3* 2.6*   Urine analysis:    Component Value Date/Time   COLORURINE YELLOW 09/03/2018 1918   APPEARANCEUR HAZY (A) 09/03/2018 1918   LABSPEC 1.012 09/03/2018 1918   PHURINE 6.0 09/03/2018 1918   GLUCOSEU >=500 (A) 09/03/2018 1918   HGBUR LARGE (A) 09/03/2018 1918   BILIRUBINUR NEGATIVE 09/03/2018 1918   KETONESUR NEGATIVE 09/03/2018 1918   PROTEINUR 100 (A) 09/03/2018 1918   UROBILINOGEN 1.0 10/06/2013 1527   NITRITE NEGATIVE 09/03/2018 1918   LEUKOCYTESUR TRACE (A) 09/03/2018 1918    Recent Results (from the past 240 hour(s))  Urine Culture     Status: Abnormal   Collection Time: 09/03/18  7:18 PM  Result Value Ref Range Status   Specimen Description   Final    URINE, CLEAN CATCH Performed at [facility], [address] [place], [place] [address]    Special Requests   Final    NONE Performed at [facility], [address], [place], [place] [address]    Culture (A)  Final    <10,000 COLONIES/mL INSIGNIFICANT GROWTH Performed at [facility], [address], [place], [place] [address]    Report Status 09/05/2018 FINAL  Final  Blood Culture (routine x 2)     Status: None (Preliminary result)   Collection Time: 09/12/2018  6:54 PM   Result Value Ref Range Status   Specimen Description BLOOD RIGHT HAND  Final   Special Requests   Final    BOTTLES DRAWN AEROBIC AND ANAEROBIC Blood Culture adequate volume   Culture   Final    NO GROWTH 2 DAYS Performed at [facility], [address], [place], [place] [address]    Report Status PENDING  Incomplete  Blood Culture (routine x 2)     Status: None (Preliminary result)   Collection Time: 09/08/2018  6:55 PM  Result Value Ref Range Status   Specimen Description LEFT ANTECUBITAL  Final   Special Requests  Final    BOTTLES DRAWN AEROBIC AND ANAEROBIC Blood Culture adequate volume   Culture   Final    NO GROWTH 2 DAYS Performed at [facility], [address], [place], [place] [address]    Report Status PENDING  Incomplete  MRSA PCR Screening     Status: None   Collection Time: 09/10/18  8:39 AM  Result Value Ref Range Status   MRSA by PCR NEGATIVE NEGATIVE Final    Comment:        The GeneXpert MRSA Assay (FDA approved for NASAL specimens only), is one component of a comprehensive MRSA colonization surveillance program. It is not intended to diagnose MRSA infection nor to guide or monitor treatment for MRSA infections. Performed at [facility], [address] [place], [place] [address]     Radiology Studies: Dg Chest [facility] 1 View  Result Date: 09/10/2018 CLINICAL DATA:  Dyspnea and fever EXAM: PORTABLE CHEST 1 VIEW COMPARISON:  08/27/2018 FINDINGS: Interval increase in moderate right effusion obscuring the right hemidiaphragm and portions of the right heart border. Pulmonary vascular congestion is noted. Stable cardiomegaly with tortuous atherosclerotic aorta is identified. Atelectasis at the left lung base is noted. No acute nor suspicious osseous abnormalities. IMPRESSION: Cardiomegaly with pulmonary vascular congestion and increase in right effusion since prior. Electronically Signed   By: Ashley Royalty M.D.   On: 09/10/2018 19:16    Scheduled Meds: . aspirin EC   81 mg Oral q morning - 10a  . budesonide (PULMICORT) nebulizer solution  0.5 mg Nebulization BID  . cholecalciferol  2,000 Units Oral q morning - 10a  . citalopram  20 mg Oral Daily  . clopidogrel  75 mg Oral q morning - 10a  . famotidine  20 mg Oral Daily  . ipratropium  0.5 mg Nebulization Q6H  . levalbuterol  0.63 mg Nebulization Q6H  . metoprolol tartrate  25 mg Oral BID  . niacin  1,000 mg Oral QHS  . OLANZapine  5 mg Oral QPM  . oseltamivir  30 mg Oral QHS  . pravastatin  80 mg Oral Daily  . predniSONE  40 mg Oral Q breakfast  . sodium bicarbonate  1,300 mg Oral BID   Continuous Infusions: . ceFEPime (MAXIPIME) IV Stopped (09/11/18 0112)  . metronidazole 500 mg (09/11/18 0938)     LOS: 2 days    Time spent: 35 minutes.  Greater than 50% of this time was spent in direct contact with the patient, coordinating care and discussing relevant ongoing clinical issues, including his pronation regarding transition of antibiotics through his veins, continue treatment with Tamiflu for underlying influenza, continue treatment for underlying COPD exacerbation and continue weaning oxygen supplementation as tolerated.  Patient ended using BiPAP last night. Family was at bedside and all questions were answered, while updating then about plan of care.  Repeat two-view chest x-ray in a.m.   Barton Dubois, MD [facility] Pager [phone]   09/11/2018, 4:04 PM

## 2018-09-12 LAB — CBC WITH DIFFERENTIAL/PLATELET
Abs Immature Granulocytes: 0.04 10*3/uL (ref 0.00–0.07)
Basophils Absolute: 0 10*3/uL (ref 0.0–0.1)
Basophils Relative: 0 %
EOS PCT: 0 %
Eosinophils Absolute: 0 10*3/uL (ref 0.0–0.5)
HCT: 26.7 % — ABNORMAL LOW (ref 39.0–52.0)
Hemoglobin: 7.7 g/dL — ABNORMAL LOW (ref 13.0–17.0)
Immature Granulocytes: 1 %
Lymphocytes Relative: 5 %
Lymphs Abs: 0.3 10*3/uL — ABNORMAL LOW (ref 0.7–4.0)
MCH: 30 pg (ref 26.0–34.0)
MCHC: 28.8 g/dL — AB (ref 30.0–36.0)
MCV: 103.9 fL — ABNORMAL HIGH (ref 80.0–100.0)
MONO ABS: 0.2 10*3/uL (ref 0.1–1.0)
Monocytes Relative: 3 %
Neutro Abs: 5.8 10*3/uL (ref 1.7–7.7)
Neutrophils Relative %: 91 %
Platelets: 53 10*3/uL — ABNORMAL LOW (ref 150–400)
RBC: 2.57 MIL/uL — ABNORMAL LOW (ref 4.22–5.81)
RDW: 16.7 % — ABNORMAL HIGH (ref 11.5–15.5)
WBC: 6.3 10*3/uL (ref 4.0–10.5)
nRBC: 0 % (ref 0.0–0.2)

## 2018-09-12 LAB — STREP PNEUMONIAE URINARY ANTIGEN: STREP PNEUMO URINARY ANTIGEN: NEGATIVE

## 2018-09-12 MED ORDER — SALINE SPRAY 0.65 % NA SOLN
1.0000 | NASAL | Status: DC | PRN
  Filled 2018-09-12: qty 44

## 2018-09-12 MED ORDER — METOPROLOL TARTRATE 25 MG PO TABS
12.5000 mg | ORAL_TABLET | Freq: Two times a day (BID) | ORAL | Status: DC
  Administered 2018-09-12 – 2018-09-15 (×7): 12.5 mg via ORAL
  Filled 2018-09-12 (×7): qty 1

## 2018-09-12 NOTE — Progress Notes (Signed)
Pt requested CPAP placement for sleep. Pt placed on [facility] CPAP with 4L O2 in line. CPAP is plugged into red outlet

## 2018-09-12 NOTE — Progress Notes (Signed)
PROGRESS NOTE    Kyle [name]  NVB:[id number] DOB: February 27, 1934 DOA: 09/08/2018 PCP: Mikey Kirschner, MD    Brief Narrative:  85 y.o. male with medical history significant of but not limited to AAA, normal gait, adenocarcinoma right lung, right arm fracture, CAD, history of an STEMI, BPH, history of TURP, bilateral cystic renal masses, history of bladder cancer, history of transurethral resection of bladder cancer lesion plus chemotherapy, cataracts, COPD, chronic kidney disease, type 2 diabetes, diplopia, diverticulosis, hypertension, hyperlipidemia, insomnia ITP, nephrolithiasis, obesity, OSA on CPAP, diabetic peripheral polyneuropathy, tobacco use, ventral hernia, recently admitted from 08/27/2018 until 08/31/2018 due to COPD exacerbation and discharged to the [facility] for physical therapy who is now coming to the emergency department due to dyspnea, fever and confusion.  There was concern about the patient having an upper respiratory infection or pneumonia at the facility and he was started on doxycycline.  ED Course: Initial vital signs temperature 101.9 F, pulse 120, respirations 18, blood pressure 158/88 mmHg and O2 sat 96% on nasal cannula oxygen.  He was given acetaminophen 1000 mg p.o. x1, 1000 mL of NS bolus, cefepime, metronidazole and vancomycin.  Blood cultures x2 were drawn prior to antibiotics being infused.  ABG shows FiO2 of 40%, pH of 7.468, PCO2 31.8 and PO2 of 54.1 mmol/L.  Rest of the ABGs within normal limits.  Lactic acid was 2.1 and repeat was 1.5 mmol/L.  White count is 10.9, hemoglobin 9.5 g/dL and platelets 57.  CMP shows a potassium of 5.2 mmol/L.  The rest of the electrolytes are within normal limits.  Bilirubin was 2.5, BUN was 55, creatinine 3.19 and glucose 145 mg/dL.  Albumin was 3.3 g/dL.  Transaminases were unremarkable.  Influenza A and B by PCR was positive for influenza type A.  Chest radiograph shows cardiomegaly with pulmonary vascular congestion and  increased in right pleural effusion since previous film.  Please see images and full radiology report for further detail.   Assessment & Plan: 1-acute respiratory failure with hypoxemia: In the setting of COPD exacerbation and influenza A infection -Patient is slowly improving; has no longer required the use of BiPAP. -Of note, during recent hospitalization patient required to be discharged on oxygen supplementation using 2.5 L nasal cannula.  Currently requiring 4-5 L nasal cannula. -Continue steroids, Xopenex, Atrovent and Pulmicort. -Started on flutter valve. -Continue treatment with Tamiflu for influenza -Vancomycin and cefepime now discontinue. Continue treatment with Augmentin, for underlying bronchiectasis. -Continue weaning off oxygen supplementation as tolerated  2-influenza A -Continue treatment with Tamiflu -Droplet precautions in place -Anticipated last dose of Tamiflu 09/13/2018. -Patient has remained afebrile.  3-stage IV chronic kidney disease -Renal function appears to be stable -At this moment will discontinue IV fluids -Monitor renal function trend and follow electrolytes.  4-mild hyperkalemia -Potassium within normal limits. -Continue to follow electrolytes trend.  5-type 2 diabetes mellitus -Continue sliding scale insulin; given use of steroids. -Modified carbohydrate diet has been ordered -Most recent A1c 5.2  6-history of coronary artery disease -Patient denies chest pain -Continue aspirin, Plavix and statins.  7-history of hyperlipidemia -Continue statins.  8-benign essential hypertension -Blood pressure soft on admission -Continue monitoring vital signs.  9-GERD -continue Pepcid   10-depression/anxiety -Continue the use of Celexa -Continue as needed Xanax.  11-obstructive sleep apnea -Continue the use of CPAP nightly.  12-chronic thrombocytopenia -Will continue monitoring platelets count -Avoid heparin drip -SCDs for DVT  prophylaxis.  13-sepsis: Due to influenza -Sepsis criteria met on admission -Sepsis physiology resolved. -  Continue treatment for influenza/bronchiectasis.  DVT prophylaxis: SCDs Code Status: DNR Family Communication: Son and wife at bedside. Disposition Plan: Remains in in the hospital, transferred to [facility] [place], ask physical therapy to evaluate for stability regarding discharge needs.  Continue steroids, nebulizer management, Tamiflu, oral antibiotics and supportive care.  Consultants:   None  Procedures:   See below for x-ray reports.  Antimicrobials:  Anti-infectives (From admission, onward)   Start     Dose/Rate Route Frequency Ordered Stop   09/11/18 1900  vancomycin (VANCOCIN) 1,500 mg in sodium chloride 0.9 % 500 mL IVPB  Status:  Discontinued     1,500 mg 250 mL/hr over 120 Minutes Intravenous Every 48 hours 09/04/2018 1939 09/10/18 1005   09/11/18 1615  amoxicillin-clavulanate (AUGMENTIN) 500-125 MG per tablet 500 mg     1 tablet Oral 2 times daily 09/11/18 1606     09/10/18 1900  ceFEPIme (MAXIPIME) 2 g in sodium chloride 0.9 % 100 mL IVPB  Status:  Discontinued     2 g 200 mL/hr over 30 Minutes Intravenous Every 24 hours 08/25/2018 1939 09/10/18 0749   09/10/18 1900  ceFEPIme (MAXIPIME) 1 g in sodium chloride 0.9 % 100 mL IVPB  Status:  Discontinued     1 g 200 mL/hr over 30 Minutes Intravenous Every 24 hours 09/10/18 0749 09/11/18 1606   09/13/2018 2200  oseltamivir (TAMIFLU) capsule 30 mg  Status:  Discontinued     30 mg Oral 2 times daily 08/28/2018 2150 08/28/2018 2155   09/14/2018 2200  oseltamivir (TAMIFLU) capsule 30 mg     30 mg Oral Daily at bedtime 09/04/2018 2155     08/29/2018 2145  oseltamivir (TAMIFLU) capsule 75 mg  Status:  Discontinued     75 mg Oral 2 times daily 09/08/2018 2130 09/05/2018 2150   09/07/2018 1930  vancomycin (VANCOCIN) IVPB 1000 mg/200 mL premix     1,000 mg 200 mL/hr over 60 Minutes Intravenous  Once 08/22/2018 1926 09/03/2018 2145   09/10/2018 1845  ceFEPIme  (MAXIPIME) 2 g in sodium chloride 0.9 % 100 mL IVPB     2 g 200 mL/hr over 30 Minutes Intravenous  Once 08/30/2018 1832 09/10/18 0018   09/06/2018 1845  metroNIDAZOLE (FLAGYL) IVPB 500 mg  Status:  Discontinued     500 mg 100 mL/hr over 60 Minutes Intravenous Every 8 hours 09/08/2018 1832 09/11/18 1606   09/06/2018 1845  vancomycin (VANCOCIN) IVPB 1000 mg/200 mL premix     1,000 mg 200 mL/hr over 60 Minutes Intravenous  Once 09/12/2018 1832 08/24/2018 2145       Subjective: Still feeling slightly short of breath and requiring 4-5 L nasal cannula supplementation.  Patient has remained afebrile, denies chest pain, no nausea, no vomiting.  Back to just using CPAP nightly.  Objective: Vitals:   09/12/18 0600 09/12/18 0836 09/12/18 0919 09/12/18 0920  BP: 93/61  114/72   Pulse: (!) 39   65  Resp: (!) 24  16 (!) 23  Temp:      TempSrc:      SpO2: 94% 97%  97%  Weight:      Height:        Intake/Output Summary (Last 24 hours) at 09/12/2018 1043 Last data filed at 09/12/2018 0900 Gross per 24 hour  Intake 960 ml  Output 1750 ml  Net -790 ml   Filed Weights   08/29/2018 1812 09/10/18 0200 09/11/18 0500  Weight: 112.9 kg 111 kg 113 kg    Examination: General exam:  Alert, awake, oriented x 3; denies chest pain, no nausea, no vomiting, no abdominal pain.  Patient reports breathing continue slowly improving.  Still requiring 4-5 L nasal cannula supplementation. Respiratory system: Positive scattered rhonchi right, no using accessory muscles; mild end expiratory wheezing on auscultation. Cardiovascular system:Rate controlled.  No rubs, no gallops, no JVD. Gastrointestinal system: Abdomen is nondistended, soft and nontender. No organomegaly or masses felt. Normal bowel sounds heard. Central nervous system: Alert and oriented. No focal neurological deficits. Extremities: No cyanosis or clubbing. Skin: No rashes, no petechiae.  Multiple bruises from recent blood work appreciated on his upper  extremities. Psychiatry: Judgement and insight appear normal. Mood & affect appropriate.   Data Reviewed: I have personally reviewed following labs and imaging studies  CBC: Recent Labs  Lab 09/06/18 0928 09/17/2018 1854 09/10/18 0357 09/11/18 0439 09/12/18 0514  WBC 5.9 10.9* 8.8 4.3 6.3  NEUTROABS 4.4 9.7* 7.7 4.0 5.8  HGB 8.6* 9.5* 8.1* 7.5* 7.7*  HCT 29.8* 31.6* 28.2* 25.8* 26.7*  MCV 102.8* 102.9* 103.3* 105.3* 103.9*  PLT 65* 57* 53* 45* 53*   Basic Metabolic Panel: Recent Labs  Lab 09/07/18 0700 09/17/2018 1854 09/10/18 0357  NA 144 138 140  K 4.4 5.2* 5.1  CL 112* 106 109  CO2 '24 22 25  ' GLUCOSE 98 145* 110*  BUN 58* 55* 58*  CREATININE 2.84* 3.19* 3.41*  CALCIUM 8.8* 9.0 8.4*   GFR: Estimated Creatinine Clearance: 21.7 mL/min (A) (by C-G formula based on SCr of 3.41 mg/dL (H)).   Liver Function Tests: Recent Labs  Lab 09/14/2018 1854 09/10/18 0357  AST 14* 13*  ALT 14 12  ALKPHOS 54 40  BILITOT 2.5* 1.9*  PROT 6.5 5.3*  ALBUMIN 3.3* 2.6*   Urine analysis:    Component Value Date/Time   COLORURINE YELLOW 09/11/2018 2030   APPEARANCEUR HAZY (A) 09/11/2018 2030   LABSPEC 1.013 09/11/2018 2030   PHURINE 5.0 09/11/2018 2030   GLUCOSEU 150 (A) 09/11/2018 2030   HGBUR MODERATE (A) 09/11/2018 2030   [place] NEGATIVE 09/11/2018 2030   [place] 09/11/2018 2030   PROTEINUR 100 (A) 09/11/2018 2030   UROBILINOGEN 1.0 10/06/2013 1527   NITRITE NEGATIVE 09/11/2018 2030   LEUKOCYTESUR TRACE (A) 09/11/2018 2030    Recent Results (from the past 240 hour(s))  Urine Culture     Status: Abnormal   Collection Time: 09/03/18  7:18 PM  Result Value Ref Range Status   Specimen Description   Final    URINE, CLEAN CATCH Performed at [facility], [address] [place], [place] [address]    Special Requests   Final    NONE Performed at [facility], [address] [place], [place] [address]    Culture (A)  Final    <10,000 COLONIES/mL INSIGNIFICANT  GROWTH Performed at [facility], [place] [address] [place], [place] [address]    Report Status 09/05/2018 FINAL  Final  Blood Culture (routine x 2)     Status: None (Preliminary result)   Collection Time: 08/21/2018  6:54 PM  Result Value Ref Range Status   Specimen Description BLOOD RIGHT HAND  Final   Special Requests   Final    BOTTLES DRAWN AEROBIC AND ANAEROBIC Blood Culture adequate volume   Culture   Final    NO GROWTH 3 DAYS Performed at [facility], [address] [place], [place] [address]    Report Status PENDING  Incomplete  Blood Culture (routine x 2)  Status: None (Preliminary result)   Collection Time: 09/03/2018  6:55 PM  Result Value Ref Range Status   Specimen Description LEFT ANTECUBITAL  Final   Special Requests   Final    BOTTLES DRAWN AEROBIC AND ANAEROBIC Blood Culture adequate volume   Culture   Final    NO GROWTH 3 DAYS Performed at [facility], [address] [place], [place] [address]    Report Status PENDING  Incomplete  MRSA PCR Screening     Status: None   Collection Time: 09/10/18  8:39 AM  Result Value Ref Range Status   MRSA by PCR NEGATIVE NEGATIVE Final    Comment:        The GeneXpert MRSA Assay (FDA approved for NASAL specimens only), is one component of a comprehensive MRSA colonization surveillance program. It is not intended to diagnose MRSA infection nor to guide or monitor treatment for MRSA infections. Performed at [facility], [address] [place], [place] [address]     Radiology Studies: Dg Chest 2 View  Result Date: 09/11/2018 CLINICAL DATA:  Shortness of breath. EXAM: CHEST - 2 VIEW COMPARISON:  Chest radiograph September 09, 2018 FINDINGS: Stable small to moderate RIGHT pleural effusion, small suspected LEFT pleural effusion. Stable cardiomegaly. Calcified ectatic aorta. Fullness of the pulmonary hila most compatible with vascular shadows. Pulmonary vascular congestion. No pneumothorax. Soft tissue planes and  included osseous structures are unchanged. Old LEFT anterior rib fracture. IMPRESSION: 1. Small to moderate RIGHT pleural effusion, small suspected LEFT pleural effusion. 2. Stable cardiomegaly and pulmonary vascular congestion. Electronically Signed   By: Elon Alas M.D.   On: 09/11/2018 19:15    Scheduled Meds: . amoxicillin-clavulanate  1 tablet Oral q12n4p  . aspirin EC  81 mg Oral q morning - 10a  . budesonide (PULMICORT) nebulizer solution  0.5 mg Nebulization BID  . chlorhexidine  15 mL Mouth Rinse BID  . cholecalciferol  2,000 Units Oral q morning - 10a  . citalopram  20 mg Oral Daily  . clopidogrel  75 mg Oral q morning - 10a  . famotidine  20 mg Oral Daily  . ipratropium  0.5 mg Nebulization Q6H  . levalbuterol  0.63 mg Nebulization Q6H  . mouth rinse  15 mL Mouth Rinse q12n4p  . metoprolol tartrate  12.5 mg Oral BID  . niacin  1,000 mg Oral QHS  . OLANZapine  5 mg Oral QPM  . oseltamivir  30 mg Oral QHS  . pravastatin  80 mg Oral Daily  . predniSONE  40 mg Oral Q breakfast  . sodium bicarbonate  1,300 mg Oral BID   Continuous Infusions:    LOS: 3 days    Time spent: 30 minutes.    Barton Dubois, MD [facility] Pager [phone]   09/12/2018, 10:43 AM

## 2018-09-13 ENCOUNTER — Other Ambulatory Visit (HOSPITAL_COMMUNITY): Payer: Medicare Other

## 2018-09-13 LAB — CBC WITH DIFFERENTIAL/PLATELET
Abs Immature Granulocytes: 0.02 10*3/uL (ref 0.00–0.07)
BASOS PCT: 0 %
Basophils Absolute: 0 10*3/uL (ref 0.0–0.1)
Eosinophils Absolute: 0.2 10*3/uL (ref 0.0–0.5)
Eosinophils Relative: 3 %
HCT: 29 % — ABNORMAL LOW (ref 39.0–52.0)
Hemoglobin: 8.4 g/dL — ABNORMAL LOW (ref 13.0–17.0)
Immature Granulocytes: 0 %
Lymphocytes Relative: 7 %
Lymphs Abs: 0.4 10*3/uL — ABNORMAL LOW (ref 0.7–4.0)
MCH: 29.8 pg (ref 26.0–34.0)
MCHC: 29 g/dL — ABNORMAL LOW (ref 30.0–36.0)
MCV: 102.8 fL — ABNORMAL HIGH (ref 80.0–100.0)
Monocytes Absolute: 0.1 10*3/uL (ref 0.1–1.0)
Monocytes Relative: 2 %
Neutro Abs: 5.1 10*3/uL (ref 1.7–7.7)
Neutrophils Relative %: 88 %
Platelets: 57 10*3/uL — ABNORMAL LOW (ref 150–400)
RBC: 2.82 MIL/uL — ABNORMAL LOW (ref 4.22–5.81)
RDW: 16.7 % — ABNORMAL HIGH (ref 11.5–15.5)
WBC: 5.8 10*3/uL (ref 4.0–10.5)
nRBC: 0 % (ref 0.0–0.2)

## 2018-09-13 LAB — BASIC METABOLIC PANEL
Anion gap: 8 (ref 5–15)
BUN: 61 mg/dL — ABNORMAL HIGH (ref 8–23)
CO2: 22 mmol/L (ref 22–32)
Calcium: 8.1 mg/dL — ABNORMAL LOW (ref 8.9–10.3)
Chloride: 109 mmol/L (ref 98–111)
Creatinine, Ser: 3.04 mg/dL — ABNORMAL HIGH (ref 0.61–1.24)
GFR calc Af Amer: 21 mL/min — ABNORMAL LOW (ref >60)
GFR calc non Af Amer: 18 mL/min — ABNORMAL LOW (ref >60)
GLUCOSE: 215 mg/dL — AB (ref 70–99)
Potassium: 4 mmol/L (ref 3.5–5.1)
Sodium: 139 mmol/L (ref 135–145)

## 2018-09-13 LAB — CREATININE, SERUM
Creatinine, Ser: 3.03 mg/dL — ABNORMAL HIGH (ref 0.61–1.24)
GFR calc Af Amer: 21 mL/min — ABNORMAL LOW (ref >60)
GFR calc non Af Amer: 18 mL/min — ABNORMAL LOW (ref >60)

## 2018-09-13 LAB — CBC
HEMATOCRIT: 29.6 % — AB (ref 39.0–52.0)
Hemoglobin: 8.4 g/dL — ABNORMAL LOW (ref 13.0–17.0)
MCH: 29.5 pg (ref 26.0–34.0)
MCHC: 28.4 g/dL — ABNORMAL LOW (ref 30.0–36.0)
MCV: 103.9 fL — ABNORMAL HIGH (ref 80.0–100.0)
Platelets: 63 10*3/uL — ABNORMAL LOW (ref 150–400)
RBC: 2.85 MIL/uL — ABNORMAL LOW (ref 4.22–5.81)
RDW: 16.7 % — ABNORMAL HIGH (ref 11.5–15.5)
WBC: 6.2 10*3/uL (ref 4.0–10.5)
nRBC: 0 % (ref 0.0–0.2)

## 2018-09-13 LAB — GLUCOSE, CAPILLARY: Glucose-Capillary: 150 mg/dL — ABNORMAL HIGH (ref 70–99)

## 2018-09-13 MED ORDER — IPRATROPIUM BROMIDE 0.02 % IN SOLN
0.5000 mg | Freq: Three times a day (TID) | RESPIRATORY_TRACT | Status: DC
  Administered 2018-09-13 – 2018-09-15 (×6): 0.5 mg via RESPIRATORY_TRACT
  Filled 2018-09-13 (×6): qty 2.5

## 2018-09-13 MED ORDER — LEVALBUTEROL HCL 0.63 MG/3ML IN NEBU
0.6300 mg | INHALATION_SOLUTION | Freq: Three times a day (TID) | RESPIRATORY_TRACT | Status: DC
  Administered 2018-09-13 – 2018-09-15 (×6): 0.63 mg via RESPIRATORY_TRACT
  Filled 2018-09-13 (×6): qty 3

## 2018-09-13 NOTE — Progress Notes (Signed)
Pt constantly trying to get out of bed and removing devices RN sitting by room

## 2018-09-13 NOTE — Progress Notes (Signed)
PROGRESS NOTE    Kyle [name]  UUV:[id number] DOB: 1933-11-06 DOA: 09/17/2018 PCP: Mikey Kirschner, MD    Brief Narrative:  85 y.o. male with medical history significant of but not limited to AAA, normal gait, adenocarcinoma right lung, right arm fracture, CAD, history of an STEMI, BPH, history of TURP, bilateral cystic renal masses, history of bladder cancer, history of transurethral resection of bladder cancer lesion plus chemotherapy, cataracts, COPD, chronic kidney disease, type 2 diabetes, diplopia, diverticulosis, hypertension, hyperlipidemia, insomnia ITP, nephrolithiasis, obesity, OSA on CPAP, diabetic peripheral polyneuropathy, tobacco use, ventral hernia, recently admitted from 08/27/2018 until 08/31/2018 due to COPD exacerbation and discharged to the [facility] for physical therapy who is now coming to the emergency department due to dyspnea, fever and confusion.  There was concern about the patient having an upper respiratory infection or pneumonia at the facility and he was started on doxycycline.  ED Course: Initial vital signs temperature 101.9 F, pulse 120, respirations 18, blood pressure 158/88 mmHg and O2 sat 96% on nasal cannula oxygen.  He was given acetaminophen 1000 mg p.o. x1, 1000 mL of NS bolus, cefepime, metronidazole and vancomycin.  Blood cultures x2 were drawn prior to antibiotics being infused.  ABG shows FiO2 of 40%, pH of 7.468, PCO2 31.8 and PO2 of 54.1 mmol/L.  Rest of the ABGs within normal limits.  Lactic acid was 2.1 and repeat was 1.5 mmol/L.  White count is 10.9, hemoglobin 9.5 g/dL and platelets 57.  CMP shows a potassium of 5.2 mmol/L.  The rest of the electrolytes are within normal limits.  Bilirubin was 2.5, BUN was 55, creatinine 3.19 and glucose 145 mg/dL.  Albumin was 3.3 g/dL.  Transaminases were unremarkable.  Influenza A and B by PCR was positive for influenza type A.  Chest radiograph shows cardiomegaly with pulmonary vascular congestion and  increased in right pleural effusion since previous film.  Please see images and full radiology report for further detail.   Assessment & Plan: 1-acute respiratory failure with hypoxemia: In the setting of COPD exacerbation and influenza A infection -Patient is slowly improving; has no longer required the use of BiPAP. -Of note, during recent hospitalization patient required to be discharged on oxygen supplementation using 2.5 L nasal cannula.  Currently requiring 3-4 L nasal cannula. -Continue steroids, Xopenex, Atrovent and Pulmicort. -Started on flutter valve. -Continue treatment with Tamiflu for influenza -Vancomycin and cefepime now discontinue. Continue treatment with Augmentin, for underlying bronchiectasis. -Continue weaning off oxygen supplementation as tolerated  2-influenza A -Continue treatment with Tamiflu -Droplet precautions in place -Anticipated last dose of Tamiflu 09/13/2018. -Patient has remained afebrile.  3-stage IV chronic kidney disease -Renal function appears to be stable -At this moment will discontinue IV fluids -Monitor renal function trend and follow electrolytes.  4-mild hyperkalemia -Potassium within normal limits. -Continue to follow electrolytes trend.  5-type 2 diabetes mellitus -Continue sliding scale insulin; given use of steroids. -Modified carbohydrate diet has been ordered -Most recent A1c 5.2  6-history of coronary artery disease -Patient denies chest pain -Continue aspirin, Plavix and statins.  7-history of hyperlipidemia -Continue statins.  8-benign essential hypertension -Blood pressure soft on admission -Continue monitoring vital signs.  9-GERD -continue Pepcid   10-depression/anxiety -Continue the use of Celexa -Continue as needed Xanax.  11-obstructive sleep apnea -Continue the use of CPAP nightly.  12-chronic thrombocytopenia -Will continue monitoring platelets count -Avoid heparin drip -SCDs for DVT  prophylaxis.  13-sepsis: Due to influenza -Sepsis criteria met on admission -Sepsis physiology resolved. -  Continue treatment for influenza/bronchiectasis.  DVT prophylaxis: SCDs Code Status: DNR Family Communication: Son and wife at bedside. Disposition Plan: Remains in in the hospital, ask physical therapy to evaluate for stability regarding discharge needs.  Continue steroids, nebulizer management, complete Tamiflu, oral antibiotics and supportive care.  Consultants:   None  Procedures:   See below for x-ray reports.  Antimicrobials:  Anti-infectives (From admission, onward)   Start     Dose/Rate Route Frequency Ordered Stop   09/11/18 1900  vancomycin (VANCOCIN) 1,500 mg in sodium chloride 0.9 % 500 mL IVPB  Status:  Discontinued     1,500 mg 250 mL/hr over 120 Minutes Intravenous Every 48 hours 09/17/2018 1939 09/10/18 1005   09/11/18 1615  amoxicillin-clavulanate (AUGMENTIN) 500-125 MG per tablet 500 mg     1 tablet Oral 2 times daily 09/11/18 1606 09/15/18 1559   09/10/18 1900  ceFEPIme (MAXIPIME) 2 g in sodium chloride 0.9 % 100 mL IVPB  Status:  Discontinued     2 g 200 mL/hr over 30 Minutes Intravenous Every 24 hours 09/17/2018 1939 09/10/18 0749   09/10/18 1900  ceFEPIme (MAXIPIME) 1 g in sodium chloride 0.9 % 100 mL IVPB  Status:  Discontinued     1 g 200 mL/hr over 30 Minutes Intravenous Every 24 hours 09/10/18 0749 09/11/18 1606   08/27/2018 2200  oseltamivir (TAMIFLU) capsule 30 mg  Status:  Discontinued     30 mg Oral 2 times daily 09/07/2018 2150 08/31/2018 2155   09/14/2018 2200  oseltamivir (TAMIFLU) capsule 30 mg     30 mg Oral Daily at bedtime 09/03/2018 2155 09/14/18 2159   08/31/2018 2145  oseltamivir (TAMIFLU) capsule 75 mg  Status:  Discontinued     75 mg Oral 2 times daily 09/11/2018 2130 08/26/2018 2150   08/30/2018 1930  vancomycin (VANCOCIN) IVPB 1000 mg/200 mL premix     1,000 mg 200 mL/hr over 60 Minutes Intravenous  Once 09/01/2018 1926 09/12/2018 2145   08/23/2018 1845   ceFEPIme (MAXIPIME) 2 g in sodium chloride 0.9 % 100 mL IVPB     2 g 200 mL/hr over 30 Minutes Intravenous  Once 08/22/2018 1832 09/10/18 0018   08/23/2018 1845  metroNIDAZOLE (FLAGYL) IVPB 500 mg  Status:  Discontinued     500 mg 100 mL/hr over 60 Minutes Intravenous Every 8 hours 09/11/2018 1832 09/11/18 1606   09/07/2018 1845  vancomycin (VANCOCIN) IVPB 1000 mg/200 mL premix     1,000 mg 200 mL/hr over 60 Minutes Intravenous  Once 09/03/2018 1832 08/27/2018 2145       Subjective: No chest pain, no fever.  Using 3-4 L nasal cannula supplementation.  No nausea, no vomiting.  Per family intermittent episode of confusion and nursing staff reporting some difficult to settle down at nighttime.  Objective: Vitals:   09/13/18 1300 09/13/18 1400 09/13/18 1500 09/13/18 1601  BP: (!) 113/45  130/78   Pulse: (!) 37 76 (!) 59 64  Resp: (!) 21 (!) 30 17 (!) 23  Temp:    97.7 F (36.5 C)  TempSrc:    Oral  SpO2: 97% 92% 96% 97%  Weight:      Height:        Intake/Output Summary (Last 24 hours) at 09/13/2018 1649 Last data filed at 09/13/2018 0900 Gross per 24 hour  Intake -  Output 585 ml  Net -585 ml   Filed Weights   09/10/18 0200 09/11/18 0500 09/13/18 0500  Weight: 111 kg 113 kg 112.9 kg  Examination: General exam: Alert, awake, oriented x 2; family is slightly confused.  No chest pain, no nausea, no vomiting, no abdominal pain.  Patient reports breathing is fair and continue improving.  Using 3-4 L nasal cannula supplementation. Respiratory system: Mild expiratory wheezing, normal respiratory effort, no crackles, positive scattered rhonchi.   Cardiovascular system:Rate controlled. No murmurs, rubs, gallops. Gastrointestinal system: Abdomen is nondistended, soft and nontender. No organomegaly or masses felt. Normal bowel sounds heard. Central nervous system: Alert and oriented. No focal neurological deficits. Extremities: No cyanosis or clubbing. Skin: No rashes, no petechiae.  Multiple  bruises appreciated on upper extremity from recent blood work. Psychiatry: Judgement and insight appear normal during my examination..    Data Reviewed: I have personally reviewed following labs and imaging studies  CBC: Recent Labs  Lab 08/26/2018 1854 09/10/18 0357 09/11/18 0439 09/12/18 0514 09/13/18 0408 09/13/18 1404  WBC 10.9* 8.8 4.3 6.3 5.8 6.2  NEUTROABS 9.7* 7.7 4.0 5.8 5.1  --   HGB 9.5* 8.1* 7.5* 7.7* 8.4* 8.4*  HCT 31.6* 28.2* 25.8* 26.7* 29.0* 29.6*  MCV 102.9* 103.3* 105.3* 103.9* 102.8* 103.9*  PLT 57* 53* 45* 53* 57* 63*   Basic Metabolic Panel: Recent Labs  Lab 09/07/18 0700 09/01/2018 1854 09/10/18 0357 09/13/18 0408 09/13/18 1404  NA 144 138 140  --  139  K 4.4 5.2* 5.1  --  4.0  CL 112* 106 109  --  109  CO2 _0 --  22  GLUCOSE 98 145* 110*  --  215*  BUN 58* 55* 58*  --  61*  CREATININE 2.84* 3.19* 3.41* 3.03* 3.04*  CALCIUM 8.8* 9.0 8.4*  --  8.1*   GFR: Estimated Creatinine Clearance: 24.3 mL/min (A) (by C-G formula based on SCr of 3.04 mg/dL (H)).   Liver Function Tests: Recent Labs  Lab 08/23/2018 1854 09/10/18 0357  AST 14* 13*  ALT 14 12  ALKPHOS 54 40  BILITOT 2.5* 1.9*  PROT 6.5 5.3*  ALBUMIN 3.3* 2.6*   Urine analysis:    Component Value Date/Time   COLORURINE YELLOW 09/11/2018 2030   APPEARANCEUR HAZY (A) 09/11/2018 2030   LABSPEC 1.013 09/11/2018 2030   PHURINE 5.0 09/11/2018 2030   GLUCOSEU 150 (A) 09/11/2018 2030   HGBUR MODERATE (A) 09/11/2018 2030   [place] NEGATIVE 09/11/2018 2030   [place] 09/11/2018 2030   PROTEINUR 100 (A) 09/11/2018 2030   UROBILINOGEN 1.0 10/06/2013 1527   NITRITE NEGATIVE 09/11/2018 2030   LEUKOCYTESUR TRACE (A) 09/11/2018 2030    Recent Results (from the past 240 hour(s))  Urine Culture     Status: Abnormal   Collection Time: 09/03/18  7:18 PM  Result Value Ref Range Status   Specimen Description   Final    URINE, CLEAN CATCH Performed at [facility], [address], [place], [place] [address]    Special Requests   Final    NONE Performed at [facility], [address] [place], [place] [address]    Culture (A)  Final    <10,000 COLONIES/mL INSIGNIFICANT GROWTH Performed at [facility], [place] [address], [place], [place] [address]    Report Status 09/05/2018 FINAL  Final  Blood Culture (routine x 2)     Status: None (Preliminary result)   Collection Time: 09/13/2018  6:54 PM  Result Value Ref Range Status   Specimen Description BLOOD RIGHT HAND  Final   Special Requests   Final    BOTTLES DRAWN AEROBIC  AND ANAEROBIC Blood Culture adequate volume   Culture   Final    NO GROWTH 4 DAYS Performed at [facility], [address], [place], [place] [address]    Report Status PENDING  Incomplete  Blood Culture (routine x 2)     Status: None (Preliminary result)   Collection Time: 09/01/2018  6:55 PM  Result Value Ref Range Status   Specimen Description LEFT ANTECUBITAL  Final   Special Requests   Final    BOTTLES DRAWN AEROBIC AND ANAEROBIC Blood Culture adequate volume   Culture   Final    NO GROWTH 4 DAYS Performed at [facility], [address] [place], [place] [address]    Report Status PENDING  Incomplete  MRSA PCR Screening     Status: None   Collection Time: 09/10/18  8:39 AM  Result Value Ref Range Status   MRSA by PCR NEGATIVE NEGATIVE Final    Comment:        The GeneXpert MRSA Assay (FDA approved for NASAL specimens only), is one component of a comprehensive MRSA colonization surveillance program. It is not intended to diagnose MRSA infection nor to guide or monitor treatment for MRSA infections. Performed at [facility], [address] [place], [place] [address]     Radiology Studies: Dg Chest 2 View  Result Date: 09/11/2018 CLINICAL DATA:  Shortness of breath. EXAM: CHEST - 2 VIEW COMPARISON:  Chest radiograph September 09, 2018 FINDINGS: Stable small to moderate RIGHT pleural effusion, small suspected LEFT pleural  effusion. Stable cardiomegaly. Calcified ectatic aorta. Fullness of the pulmonary hila most compatible with vascular shadows. Pulmonary vascular congestion. No pneumothorax. Soft tissue planes and included osseous structures are unchanged. Old LEFT anterior rib fracture. IMPRESSION: 1. Small to moderate RIGHT pleural effusion, small suspected LEFT pleural effusion. 2. Stable cardiomegaly and pulmonary vascular congestion. Electronically Signed   By: Elon Alas M.D.   On: 09/11/2018 19:15    Scheduled Meds: . amoxicillin-clavulanate  1 tablet Oral q12n4p  . aspirin EC  81 mg Oral q morning - 10a  . budesonide (PULMICORT) nebulizer solution  0.5 mg Nebulization BID  . chlorhexidine  15 mL Mouth Rinse BID  . cholecalciferol  2,000 Units Oral q morning - 10a  . citalopram  20 mg Oral Daily  . clopidogrel  75 mg Oral q morning - 10a  . famotidine  20 mg Oral Daily  . ipratropium  0.5 mg Nebulization Q8H  . levalbuterol  0.63 mg Nebulization Q8H  . mouth rinse  15 mL Mouth Rinse q12n4p  . metoprolol tartrate  12.5 mg Oral BID  . niacin  1,000 mg Oral QHS  . OLANZapine  5 mg Oral QPM  . oseltamivir  30 mg Oral QHS  . pravastatin  80 mg Oral Daily  . predniSONE  40 mg Oral Q breakfast  . sodium bicarbonate  1,300 mg Oral BID   Continuous Infusions:    LOS: 4 days    Time spent: 30 minutes.    Barton Dubois, MD [facility] Pager [phone]   09/13/2018, 4:49 PM

## 2018-09-14 LAB — BLOOD GAS, ARTERIAL
Acid-base deficit: 0.1 mmol/L (ref 0.0–2.0)
Bicarbonate: 23.6 mmol/L (ref 20.0–28.0)
FIO2: 40
O2 SAT: 60.5 %
Patient temperature: 37
pCO2 arterial: 45.7 mmHg (ref 32.0–48.0)
pH, Arterial: 7.353 (ref 7.350–7.450)
pO2, Arterial: 37.8 mmHg — CL (ref 83.0–108.0)

## 2018-09-14 LAB — CBC WITH DIFFERENTIAL/PLATELET
Abs Immature Granulocytes: 0.07 10*3/uL (ref 0.00–0.07)
Basophils Absolute: 0 10*3/uL (ref 0.0–0.1)
Basophils Relative: 0 %
Eosinophils Absolute: 0 10*3/uL (ref 0.0–0.5)
Eosinophils Relative: 0 %
HCT: 30.6 % — ABNORMAL LOW (ref 39.0–52.0)
Hemoglobin: 9 g/dL — ABNORMAL LOW (ref 13.0–17.0)
Immature Granulocytes: 1 %
Lymphocytes Relative: 9 %
Lymphs Abs: 0.6 10*3/uL — ABNORMAL LOW (ref 0.7–4.0)
MCH: 30 pg (ref 26.0–34.0)
MCHC: 29.4 g/dL — ABNORMAL LOW (ref 30.0–36.0)
MCV: 102 fL — ABNORMAL HIGH (ref 80.0–100.0)
Monocytes Absolute: 0.2 10*3/uL (ref 0.1–1.0)
Monocytes Relative: 3 %
Neutro Abs: 5.8 10*3/uL (ref 1.7–7.7)
Neutrophils Relative %: 87 %
Platelets: 72 10*3/uL — ABNORMAL LOW (ref 150–400)
RBC: 3 MIL/uL — ABNORMAL LOW (ref 4.22–5.81)
RDW: 16.9 % — ABNORMAL HIGH (ref 11.5–15.5)
WBC: 6.7 10*3/uL (ref 4.0–10.5)
nRBC: 0 % (ref 0.0–0.2)

## 2018-09-14 LAB — CULTURE, BLOOD (ROUTINE X 2)
Culture: NO GROWTH
Culture: NO GROWTH
SPECIAL REQUESTS: ADEQUATE
Special Requests: ADEQUATE

## 2018-09-14 MED ORDER — QUETIAPINE FUMARATE 25 MG PO TABS: 12.5000 mg | ORAL_TABLET | Freq: Every day | ORAL | Status: DC

## 2018-09-14 MED ORDER — ALPRAZOLAM 0.5 MG PO TABS
0.5000 mg | ORAL_TABLET | Freq: Three times a day (TID) | ORAL | Status: DC | PRN
  Administered 2018-09-14: 0.5 mg via ORAL
  Filled 2018-09-14: qty 1

## 2018-09-14 MED ORDER — ALPRAZOLAM 0.25 MG PO TABS
0.2500 mg | ORAL_TABLET | Freq: Two times a day (BID) | ORAL | Status: DC | PRN
  Administered 2018-09-14: 0.25 mg via ORAL
  Filled 2018-09-14: qty 1

## 2018-09-14 MED ORDER — HALOPERIDOL LACTATE 5 MG/ML IJ SOLN
1.0000 mg | Freq: Once | INTRAMUSCULAR | Status: AC
  Administered 2018-09-14: 1 mg via INTRAMUSCULAR
  Filled 2018-09-14: qty 1

## 2018-09-14 MED ORDER — HALOPERIDOL LACTATE 5 MG/ML IJ SOLN: 1.0000 mg | Freq: Once | INTRAMUSCULAR | Status: DC

## 2018-09-14 NOTE — Plan of Care (Signed)
  Problem: Acute Rehab PT Goals(only PT should resolve) Goal: Pt Will Go Supine/Side To Sit Outcome: Progressing Flowsheets (Taken 09/14/2018 1201) Pt will go Supine/Side to Sit: with minimal assist; with moderate assist Goal: Patient Will Transfer Sit To/From Stand Outcome: Progressing Flowsheets (Taken 09/14/2018 1201) Patient will transfer sit to/from stand: with minimal assist; with moderate assist Goal: Pt Will Transfer Bed To Chair/Chair To Bed Outcome: Progressing Flowsheets (Taken 09/14/2018 1201) Pt will Transfer Bed to Chair/Chair to Bed: with min assist; with mod assist Goal: Pt Will Ambulate Outcome: Progressing Flowsheets (Taken 09/14/2018 1201) Pt will Ambulate: 25 feet; with minimal assist; with moderate assist; with rolling walker   12:02 PM, 09/14/18 Lonell Grandchild, MPT Physical Therapist with [facility] [phone] office [phone] mobile phone

## 2018-09-14 NOTE — NC FL2 (Signed)
[place] LEVEL OF CARE SCREENING TOOL     IDENTIFICATION  Patient Name: Kyle [name] Birthdate: 1933-12-31 Sex: male Admission Date (Current Location): 08/30/2018  [facility] and [place] Number:  [employer] and Address:  [place] [address], [place]      Provider Number: [id number]  Attending Physician Name and Address:  Barton Dubois, MD  Relative Name and Phone Number:       Current Level of Care: Hospital Recommended Level of Care: [place] Prior Approval Number:    Date Approved/Denied:   PASRR Number: [id number]  Discharge Plan: SNF    Current Diagnoses: Patient Active Problem List   Diagnosis Date Noted  . Acute respiratory failure with hypoxemia ([place]) 09/15/2018  . Hyperkalemia 08/27/2018  . Influenza A 08/30/2018  . Hypoxia   . Gastroesophageal reflux disease   . Benign essential HTN   . Stage 4 chronic kidney disease ([place]) 06/30/2016  . Subdural hematoma, post-traumatic ([place]) 03/21/2016  . Subdural hematoma ([place]) 03/21/2016  . Malignant neoplasm of trigone of urinary bladder ([place]) 02/12/2016  . Bilirubinemia 12/12/2015  . Thrombocytopenia ([place]) 11/13/2015  . Cough 10/03/2015  . Allergic rhinitis 10/03/2015  . Secondary hyperparathyroidism ([place]) 09/06/2015  . Tremor, essential 08/27/2015  . History of colonic polyps   . Diverticulosis of colon without hemorrhage   . Malignant neoplasm of lateral wall of urinary bladder ([place]) 01/12/2014  . CIS (carcinoma in situ of bladder) 08/22/2013  . Static tremor 02/23/2013  . Diplopia 02/23/2013  . Diabetic polyneuropathy ([place]) 02/23/2013  . Abnormality of gait 02/23/2013  . COPD exacerbation ([place]) 01/31/2013  . Fracture of right distal radius 12/14/2012  . Type 2 diabetes mellitus ([place]) 11/30/2012  . Idiopathic thrombocytopenic purpura ([place]) 09/07/2012  . Depression 07/28/2012  . Benign prostate hyperplasia 06/10/2012  . LLQ pain  11/13/2011  . Hernia of anterior abdominal wall 10/28/2011  . AAA (abdominal aortic aneurysm) ([place])   . Tobacco abuse   . Diarrhea 10/22/2011  . Tubular adenoma of colon   . Bladder neck obstruction 06/05/2011  . Obstructive sleep apnea on CPAP 03/27/2011  . Fasting hyperglycemia 03/27/2011  . Recurrent nephrolithiasis 03/27/2011  . Carcinoma of bladder ([place]) 03/27/2011  . COPD with acute bronchitis ([place]) 02/24/2011  . Adenocarcinoma of right lung ([place]) 02/24/2011  . CAD (coronary artery disease) 01/06/2011    Orientation RESPIRATION BLADDER Height & Weight     Self, Situation, Place  O2(see dc summary) Incontinent Weight: 248 lb 14.4 oz (112.9 kg) Height:  6' (182.9 cm)  BEHAVIORAL SYMPTOMS/MOOD NEUROLOGICAL BOWEL NUTRITION STATUS      Continent Diet(see dc summary)  AMBULATORY STATUS COMMUNICATION OF NEEDS Skin   Extensive Assist Verbally Normal                       Personal Care Assistance Level of Assistance    Bathing Assistance: Limited assistance Feeding assistance: Independent Dressing Assistance: Limited assistance     Functional Limitations Info    Sight Info: Adequate Hearing Info: Adequate Speech Info: Adequate    SPECIAL CARE FACTORS FREQUENCY  PT (By licensed PT)     PT Frequency: 5 times week              Contractures Contractures Info: Not present    Additional Factors Info    Code Status Info: DNR Allergies Info: Codeine, Etodolac, Zanaflex Psychotropic Info: Xanax, Celexa  Current Medications (09/14/2018):  This is the current hospital active medication list Current Facility-Administered Medications  Medication Dose Route Frequency Provider Last Rate Last Dose  . acetaminophen (TYLENOL) tablet 650 mg  650 mg Oral Q6H PRN Barton Dubois, MD   650 mg at 09/10/18 1756   Or  . acetaminophen (TYLENOL) suppository 650 mg  650 mg Rectal Q6H PRN Barton Dubois, MD      . ALPRAZolam Duanne Moron) tablet 0.5 mg  0.5 mg Oral TID PRN  Reubin Milan, MD   0.5 mg at 09/14/18 4665  . amoxicillin-clavulanate (AUGMENTIN) 500-125 MG per tablet 500 mg  1 tablet Oral q12n4p Barton Dubois, MD   500 mg at 09/13/18 1734  . aspirin EC tablet 81 mg  81 mg Oral q morning - 10a Barton Dubois, MD   81 mg at 09/14/18 1027  . budesonide (PULMICORT) nebulizer solution 0.5 mg  0.5 mg Nebulization BID Barton Dubois, MD   0.5 mg at 09/13/18 2023  . chlorhexidine (PERIDEX) 0.12 % solution 15 mL  15 mL Mouth Rinse BID Barton Dubois, MD   15 mL at 09/14/18 1031  . cholecalciferol (VITAMIN D3) tablet 2,000 Units  2,000 Units Oral q morning - 10a Barton Dubois, MD   2,000 Units at 09/14/18 1028  . citalopram (CELEXA) tablet 20 mg  20 mg Oral Daily Barton Dubois, MD   20 mg at 09/14/18 1026  . clopidogrel (PLAVIX) tablet 75 mg  75 mg Oral q morning - 10a Barton Dubois, MD   75 mg at 09/14/18 1026  . famotidine (PEPCID) tablet 20 mg  20 mg Oral Daily Barton Dubois, MD   20 mg at 09/14/18 1026  . ipratropium (ATROVENT) nebulizer solution 0.5 mg  0.5 mg Nebulization Q8H Barton Dubois, MD   0.5 mg at 09/14/18 0605  . levalbuterol (XOPENEX) nebulizer solution 0.63 mg  0.63 mg Nebulization Q6H PRN Barton Dubois, MD      . levalbuterol Penne Lash) nebulizer solution 0.63 mg  0.63 mg Nebulization Q8H Barton Dubois, MD   0.63 mg at 09/14/18 0605  . MEDLINE mouth rinse  15 mL Mouth Rinse q12n4p Barton Dubois, MD   15 mL at 09/13/18 1737  . metoprolol tartrate (LOPRESSOR) tablet 12.5 mg  12.5 mg Oral BID Barton Dubois, MD   12.5 mg at 09/14/18 1024  . niacin (SLO-NIACIN) CR tablet 1,000 mg  1,000 mg Oral QHS Barton Dubois, MD   1,000 mg at 09/13/18 2011  . nitroGLYCERIN (NITROSTAT) SL tablet 0.4 mg  0.4 mg Sublingual Q5 min PRN Barton Dubois, MD      . OLANZapine [facility]) tablet 5 mg  5 mg Oral QPM Barton Dubois, MD   5 mg at 09/13/18 1734  . pravastatin (PRAVACHOL) tablet 80 mg  80 mg Oral Daily Barton Dubois, MD   80 mg at 09/14/18 1027  .  predniSONE (DELTASONE) tablet 40 mg  40 mg Oral Q breakfast Barton Dubois, MD   40 mg at 09/14/18 9935  . sodium bicarbonate tablet 1,300 mg  1,300 mg Oral BID Barton Dubois, MD   1,300 mg at 09/14/18 1029  . sodium chloride (OCEAN) 0.65 % nasal spray 1 spray  1 spray Each Nare PRN Barton Dubois, MD         Discharge Medications: Please see discharge summary for a list of discharge medications.  Relevant Imaging Results:  Relevant Lab Results:   Additional Information SSN: [id number], LCSW

## 2018-09-14 NOTE — Progress Notes (Signed)
PROGRESS NOTE    Kyle [name]  WGN:[id number] DOB: 31-Jan-1934 DOA: 09/15/2018 PCP: Mikey Kirschner, MD    Brief Narrative:  85 y.o. male with medical history significant of but not limited to AAA, normal gait, adenocarcinoma right lung, right arm fracture, CAD, history of an STEMI, BPH, history of TURP, bilateral cystic renal masses, history of bladder cancer, history of transurethral resection of bladder cancer lesion plus chemotherapy, cataracts, COPD, chronic kidney disease, type 2 diabetes, diplopia, diverticulosis, hypertension, hyperlipidemia, insomnia ITP, nephrolithiasis, obesity, OSA on CPAP, diabetic peripheral polyneuropathy, tobacco use, ventral hernia, recently admitted from 08/27/2018 until 08/31/2018 due to COPD exacerbation and discharged to the [facility] for physical therapy who is now coming to the emergency department due to dyspnea, fever and confusion.  There was concern about the patient having an upper respiratory infection or pneumonia at the facility and he was started on doxycycline.  ED Course: Initial vital signs temperature 101.9 F, pulse 120, respirations 18, blood pressure 158/88 mmHg and O2 sat 96% on nasal cannula oxygen.  He was given acetaminophen 1000 mg p.o. x1, 1000 mL of NS bolus, cefepime, metronidazole and vancomycin.  Blood cultures x2 were drawn prior to antibiotics being infused.  ABG shows FiO2 of 40%, pH of 7.468, PCO2 31.8 and PO2 of 54.1 mmol/L.  Rest of the ABGs within normal limits.  Lactic acid was 2.1 and repeat was 1.5 mmol/L.  White count is 10.9, hemoglobin 9.5 g/dL and platelets 57.  CMP shows a potassium of 5.2 mmol/L.  The rest of the electrolytes are within normal limits.  Bilirubin was 2.5, BUN was 55, creatinine 3.19 and glucose 145 mg/dL.  Albumin was 3.3 g/dL.  Transaminases were unremarkable.  Influenza A and B by PCR was positive for influenza type A.  Chest radiograph shows cardiomegaly with pulmonary vascular congestion and  increased in right pleural effusion since previous film.  Please see images and full radiology report for further detail.   Assessment & Plan: 1-acute respiratory failure with hypoxemia: In the setting of COPD exacerbation and influenza A infection -Patient is slowly improving; has no longer required the use of BiPAP. -Of note, during recent hospitalization patient required to be discharged on oxygen supplementation using 2.5 L nasal cannula.  Currently requiring 3-4 L nasal cannula. -Continue steroids, Xopenex, Atrovent and Pulmicort. -Started on flutter valve. -has completed treatment for influenza.  -Vancomycin and cefepime now discontinue. Continue treatment with Augmentin, for underlying bronchiectasis; last doe on 09/14/18. -Continue weaning off oxygen supplementation as tolerated  2-influenza A -Continue treatment with Tamiflu -Droplet precautions in place -Patient has completed last dose of Tamiflu on 09/13/2018. -Patient has remained afebrile.  3-stage IV chronic kidney disease -Renal function appears to be stable -At this moment will discontinue IV fluids -Monitor renal function trend and follow electrolytes.  4-mild hyperkalemia -Potassium within normal limits now. -Continue to follow electrolytes trend.  5-type 2 diabetes mellitus -Continue sliding scale insulin; given use of steroids. -Modified carbohydrate diet has been ordered -Most recent A1c 5.2  6-history of coronary artery disease -Patient denies chest pain -Continue b-blocker, aspirin, Plavix and statins.  7-history of hyperlipidemia -Continue statins.  8-benign essential hypertension -Blood pressure soft on admission -Continue monitoring vital signs. -b-blocker dose cut in half.  9-GERD -continue Pepcid   10-depression/anxiety -Continue the use of Celexa -Continue as needed Xanax; but at decreased  dose.  11-obstructive sleep apnea -Continue the use of CPAP nightly.  12-chronic  thrombocytopenia -Will continue monitoring platelets count -Avoid  heparin drip -SCDs for DVT prophylaxis.  13-sepsis: Due to influenza -Sepsis criteria met on admission -Sepsis physiology resolved. -Continue treatment for influenza/bronchiectasis.  14-hospital-acquired delirium -ABG check and CO2 within normal limits- -we will back up on the use of benzodiazepines -Start low-dose Seroquel -Continue constant reorientation  DVT prophylaxis: SCDs Code Status: DNR Family Communication: Son and wife at bedside. Disposition Plan: Remains in in the hospital, appreciate physical therapy evaluation, patient now stable to be discharged to skilled nursing facility for further care and rehabilitation once insurance authorization is achieved.  He has completed tx for influenza and is on last day of abx.  Consultants:   None  Procedures:   See below for x-ray reports.  Antimicrobials:  Anti-infectives (From admission, onward)   Start     Dose/Rate Route Frequency Ordered Stop   09/11/18 1900  vancomycin (VANCOCIN) 1,500 mg in sodium chloride 0.9 % 500 mL IVPB  Status:  Discontinued     1,500 mg 250 mL/hr over 120 Minutes Intravenous Every 48 hours 08/30/2018 1939 09/10/18 1005   09/11/18 1615  amoxicillin-clavulanate (AUGMENTIN) 500-125 MG per tablet 500 mg     1 tablet Oral 2 times daily 09/11/18 1606 09/15/18 1559   09/10/18 1900  ceFEPIme (MAXIPIME) 2 g in sodium chloride 0.9 % 100 mL IVPB  Status:  Discontinued     2 g 200 mL/hr over 30 Minutes Intravenous Every 24 hours 09/03/2018 1939 09/10/18 0749   09/10/18 1900  ceFEPIme (MAXIPIME) 1 g in sodium chloride 0.9 % 100 mL IVPB  Status:  Discontinued     1 g 200 mL/hr over 30 Minutes Intravenous Every 24 hours 09/10/18 0749 09/11/18 1606   09/03/2018 2200  oseltamivir (TAMIFLU) capsule 30 mg  Status:  Discontinued     30 mg Oral 2 times daily 09/02/2018 2150 09/15/2018 2155   08/30/2018 2200  oseltamivir (TAMIFLU) capsule 30 mg     30 mg Oral  Daily at bedtime 09/17/2018 2155 09/13/18 2011   08/28/2018 2145  oseltamivir (TAMIFLU) capsule 75 mg  Status:  Discontinued     75 mg Oral 2 times daily 09/17/2018 2130 09/14/2018 2150   09/11/2018 1930  vancomycin (VANCOCIN) IVPB 1000 mg/200 mL premix     1,000 mg 200 mL/hr over 60 Minutes Intravenous  Once 08/22/2018 1926 09/12/2018 2145   09/17/2018 1845  ceFEPIme (MAXIPIME) 2 g in sodium chloride 0.9 % 100 mL IVPB     2 g 200 mL/hr over 30 Minutes Intravenous  Once 08/30/2018 1832 09/10/18 0018   08/24/2018 1845  metroNIDAZOLE (FLAGYL) IVPB 500 mg  Status:  Discontinued     500 mg 100 mL/hr over 60 Minutes Intravenous Every 8 hours 09/15/2018 1832 09/11/18 1606   08/30/2018 1845  vancomycin (VANCOCIN) IVPB 1000 mg/200 mL premix     1,000 mg 200 mL/hr over 60 Minutes Intravenous  Once 08/26/2018 1832 08/26/2018 2145       Subjective: No chest pain, no fever.  Patient using 2 L nasal cannula supplementation and no having any nausea vomiting.  He has been experiencing intermittent episode of confusion and having increased somnolence as per family members reports.   Objective: Vitals:   09/14/18 0103 09/14/18 0605 09/14/18 1401 09/14/18 1439  BP:   (!) 146/77   Pulse:   (!) 40   Resp:   20   Temp:      TempSrc:      SpO2: 96% 97% 90% 92%  Weight:      Height:  Intake/Output Summary (Last 24 hours) at 09/14/2018 1715 Last data filed at 09/14/2018 1300 Gross per 24 hour  Intake 240 ml  Output -  Net 240 ml   Filed Weights   09/10/18 0200 09/11/18 0500 09/13/18 0500  Weight: 111 kg 113 kg 112.9 kg    Examination: General exam: Alert, awake, oriented x 2, pleasantly confused, denying chest pain, nausea, vomiting and fever. Wearing 2L [place] supplementation. Respiratory system: scattered rhonchi, mild wheezing, no crackles.  Cardiovascular system:RRR. No murmurs, rubs, gallops. Gastrointestinal system: Abdomen is nondistended, soft and nontender. No organomegaly or masses felt. Normal bowel  sounds heard. Central nervous system: Alert and oriented. No focal neurological deficits. Extremities: No cyanosis or clubbing; no edema. Skin: No rashes, no petechiae.  Multiple bruises appreciated left upper extremity from recent blood work. Psychiatry: Mood & affect appropriate.    Data Reviewed: I have personally reviewed following labs and imaging studies  CBC: Recent Labs  Lab 09/10/18 0357 09/11/18 0439 09/12/18 0514 09/13/18 0408 09/13/18 1404 09/14/18 0520  WBC 8.8 4.3 6.3 5.8 6.2 6.7  NEUTROABS 7.7 4.0 5.8 5.1  --  5.8  HGB 8.1* 7.5* 7.7* 8.4* 8.4* 9.0*  HCT 28.2* 25.8* 26.7* 29.0* 29.6* 30.6*  MCV 103.3* 105.3* 103.9* 102.8* 103.9* 102.0*  PLT 53* 45* 53* 57* 63* 72*   Basic Metabolic Panel: Recent Labs  Lab 09/12/2018 1854 09/10/18 0357 09/13/18 0408 09/13/18 1404  NA 138 140  --  139  K 5.2* 5.1  --  4.0  CL 106 109  --  109  CO2 22 25  --  22  GLUCOSE 145* 110*  --  215*  BUN 55* 58*  --  61*  CREATININE 3.19* 3.41* 3.03* 3.04*  CALCIUM 9.0 8.4*  --  8.1*   GFR: Estimated Creatinine Clearance: 24.3 mL/min (A) (by C-G formula based on SCr of 3.04 mg/dL (H)).   Liver Function Tests: Recent Labs  Lab 09/08/2018 1854 09/10/18 0357  AST 14* 13*  ALT 14 12  ALKPHOS 54 40  BILITOT 2.5* 1.9*  PROT 6.5 5.3*  ALBUMIN 3.3* 2.6*   Urine analysis:    Component Value Date/Time   COLORURINE YELLOW 09/11/2018 2030   APPEARANCEUR HAZY (A) 09/11/2018 2030   LABSPEC 1.013 09/11/2018 2030   PHURINE 5.0 09/11/2018 2030   GLUCOSEU 150 (A) 09/11/2018 2030   HGBUR MODERATE (A) 09/11/2018 2030   [place] NEGATIVE 09/11/2018 2030   [place] 09/11/2018 2030   PROTEINUR 100 (A) 09/11/2018 2030   UROBILINOGEN 1.0 10/06/2013 1527   NITRITE NEGATIVE 09/11/2018 2030   LEUKOCYTESUR TRACE (A) 09/11/2018 2030    Recent Results (from the past 240 hour(s))  Blood Culture (routine x 2)     Status: None   Collection Time: 09/08/2018  6:54 PM  Result Value Ref  Range Status   Specimen Description BLOOD RIGHT HAND  Final   Special Requests   Final    BOTTLES DRAWN AEROBIC AND ANAEROBIC Blood Culture adequate volume   Culture   Final    NO GROWTH 5 DAYS Performed at [facility], [address] [place], [place] [address]    Report Status 09/14/2018 FINAL  Final  Blood Culture (routine x 2)     Status: None   Collection Time: 09/05/2018  6:55 PM  Result Value Ref Range Status   Specimen Description LEFT ANTECUBITAL  Final   Special Requests   Final    BOTTLES DRAWN AEROBIC AND ANAEROBIC Blood Culture adequate volume  Culture   Final    NO GROWTH 5 DAYS Performed at [facility], [address] [place], [place] [address]    Report Status 09/14/2018 FINAL  Final  MRSA PCR Screening     Status: None   Collection Time: 09/10/18  8:39 AM  Result Value Ref Range Status   MRSA by PCR NEGATIVE NEGATIVE Final    Comment:        The GeneXpert MRSA Assay (FDA approved for NASAL specimens only), is one component of a comprehensive MRSA colonization surveillance program. It is not intended to diagnose MRSA infection nor to guide or monitor treatment for MRSA infections. Performed at [facility], [address], [place], [place] [address]     Radiology Studies: No results found.  Scheduled Meds: . amoxicillin-clavulanate  1 tablet Oral q12n4p  . aspirin EC  81 mg Oral q morning - 10a  . budesonide (PULMICORT) nebulizer solution  0.5 mg Nebulization BID  . chlorhexidine  15 mL Mouth Rinse BID  . cholecalciferol  2,000 Units Oral q morning - 10a  . citalopram  20 mg Oral Daily  . clopidogrel  75 mg Oral q morning - 10a  . famotidine  20 mg Oral Daily  . ipratropium  0.5 mg Nebulization Q8H  . levalbuterol  0.63 mg Nebulization Q8H  . mouth rinse  15 mL Mouth Rinse q12n4p  . metoprolol tartrate  12.5 mg Oral BID  . niacin  1,000 mg Oral QHS  . OLANZapine  5 mg Oral QPM  . pravastatin  80 mg Oral Daily  . predniSONE  40 mg Oral Q breakfast   . sodium bicarbonate  1,300 mg Oral BID   Continuous Infusions:    LOS: 5 days    Time spent: 30 minutes.    Barton Dubois, MD [facility] Pager [phone]   09/14/2018, 5:15 PM

## 2018-09-14 NOTE — Clinical Social Work Note (Signed)
Clinical Social Work Assessment  Patient Details  Name: Kyle [name] MRN: [id number] Date of Birth: 1934-05-06  Date of referral:  09/14/18               Reason for consult:  Facility Placement                Permission sought to share information with:  Chartered certified accountant granted to share information::  Yes, Verbal Permission Granted  Name::        Agency::  [facility]  Relationship::     Contact Information:     Housing/Transportation Living arrangements for the past 2 months:  Single Family Home, [place] of Information:  Patient, Adult Children Patient Interpreter Needed:  None Criminal Activity/Legal Involvement Pertinent to Current Situation/Hospitalization:  No - Comment as needed Significant Relationships:  Adult Children, Spouse Lives with:  Spouse Do you feel safe going back to the place where you live?  Yes Need for family participation in patient care:  Yes (Comment)  Care giving concerns: PT recommending return to SNF rehab.   Social Worker assessment / plan: Pt is an 85 year old male referred to [place] for SNF rehab return. Pt admitted from [facility] rehab where he has been for approximately two weeks. Spoke with pt's son at bedside yesterday to assess. Per pt's son, they were initially interested in seeing if pt could go to CIR. Son now understands that pt likely could not tolerate the intensity of CIR and he would like pt to return to [facility]. Spoke with Marianna Fuss at [facility] to update. Will send referral and she will work on [place] if they have a bed and can accept pt. Will follow.  Employment status:  Retired Nurse, adult PT Recommendations:  [place] / Referral to community resources:  [place]  Patient/Family's Response to care: Pt and family accepting of care.  Patient/Family's Understanding of and Emotional Response to Diagnosis, Current Treatment, and Prognosis: Pt has  limited understanding of diagnosis and treatment recommendations. His son appears to have good understanding. No emotional distress identified.   Emotional Assessment Appearance:  Appears stated age Attitude/Demeanor/Rapport:    Affect (typically observed):  Accepting, Calm Orientation:  Oriented to Self Alcohol / Substance use:  Not Applicable Psych involvement (Current and /or in the community):  No (Comment)  Discharge Needs  Concerns to be addressed:  Discharge Planning Concerns Readmission within the last 30 days:  Yes Current discharge risk:  Physical Impairment Barriers to Discharge:  [place], LCSW 09/14/2018, 10:39 AM

## 2018-09-14 NOTE — Evaluation (Signed)
Physical Therapy Evaluation Patient Details Name: Kyle [name] MRN: [id number] DOB: 1933/09/29 Today's Date: 09/14/2018   History of Present Illness  Kyle [name] is a 85 y.o. male with medical history significant of but not limited to AAA, normal gait, adenocarcinoma right lung, right arm fracture, CAD, history of an STEMI, BPH, history of TURP, bilateral cystic renal masses, history of bladder cancer, history of transurethral resection of bladder cancer lesion plus chemotherapy, cataracts, COPD, chronic kidney disease, type 2 diabetes, diplopia, diverticulosis, hypertension, hyperlipidemia, insomnia ITP, nephrolithiasis, obesity, OSA on CPAP, diabetic peripheral polyneuropathy, tobacco use, ventral hernia, recently admitted from 08/27/2018 until 08/31/2018 due to COPD exacerbation and discharged to the [facility] for physical therapy who is now coming to the emergency department due to dyspnea, fever and confusion.  There was concern about the patient having an upper respiratory infection or pneumonia at the facility and he was started on doxycycline.    Clinical Impression  Patient presents slightly confused, cooperative and at high risk for falls due to constant leaning backwards when standing using RW, and limited to a few side steps at bedside with slow labored unsteady movement.  Patient tolerated sitting up in chair for approximately 2 hours before put back to bed due to fatigue.  Patient will benefit from continued physical therapy in hospital and recommended venue below to increase strength, balance, endurance for safe ADLs and gait.    Follow Up Recommendations SNF    Equipment Recommendations  None recommended by PT    Recommendations for Other Services       Precautions / Restrictions Precautions Precautions: Fall Restrictions Weight Bearing Restrictions: No      Mobility  Bed Mobility Overal bed mobility: Needs Assistance Bed Mobility: Supine to Sit;Sit to Supine      Supine to sit: Mod assist;Max assist Sit to supine: Mod assist   General bed mobility comments: slow labored movement with head of bed raised  Transfers Overall transfer level: Needs assistance Equipment used: Rolling walker (2 wheeled) Transfers: Sit to/from [facility] Sit to Stand: Mod assist Stand pivot transfers: Mod assist;Max assist       General transfer comment: slow labored movement  Ambulation/Gait Ambulation/Gait assistance: Max assist Gait Distance (Feet): 3 Feet Assistive device: Rolling walker (2 wheeled) Gait Pattern/deviations: Decreased step length - right;Decreased step length - left;Decreased stride length Gait velocity: decreased   General Gait Details: limited to 4-5 slow unsteady labored side steps with frequent leaning backwards requiring use of gait belt to prevent falling backwards  Stairs            Wheelchair Mobility    Modified Rankin (Stroke Patients Only)       Balance Overall balance assessment: Needs assistance Sitting-balance support: Feet supported Sitting balance-Leahy Scale: Fair     Standing balance support: Bilateral upper extremity supported;During functional activity Standing balance-Leahy Scale: Poor Standing balance comment: using RW, frequent near loss of balance due to leaning backwards                             Pertinent Vitals/Pain Pain Assessment: No/denies pain    Home Living Family/patient expects to be discharged to:: Skilled nursing facility Living Arrangements: Spouse/significant other Available Help at Discharge: Family;Available 24 hours/day Type of Home: House Home Access: Stairs to enter Entrance Stairs-Rails: Psychiatric nurse of Steps: 3 Home Layout: One level Home Equipment: Cane - single point;Walker - 2 wheels;Bedside commode;Shower seat -  built in      Prior Function Level of Independence: Independent with assistive device(s)          Comments: prior to last hospital stay was household ambulator using RW, uses SPC for outside, since assisted for short distanced household ambulation with RW by [facility]G staff/rehab     Hand Dominance   Dominant Hand: Right    Extremity/Trunk Assessment   Upper Extremity Assessment Upper Extremity Assessment: Generalized weakness    Lower Extremity Assessment Lower Extremity Assessment: Generalized weakness    Cervical / Trunk Assessment Cervical / Trunk Assessment: Normal  Communication   Communication: Other (comment);Expressive difficulties(confused with inconsistent responses to questions)  Cognition Arousal/Alertness: Awake/alert Behavior During Therapy: Impulsive;Restless Overall Cognitive Status: Impaired/Different from baseline Area of Impairment: Orientation;Attention;Awareness                 Orientation Level: Place;Time Current Attention Level: Selective       Awareness: Anticipatory   General Comments: requires repeated verbal/tactile cueing to complete functional tasks      General Comments      Exercises     Assessment/Plan    PT Assessment Patient needs continued PT services  PT Problem List Decreased strength;Decreased activity tolerance;Decreased balance;Decreased mobility       PT Treatment Interventions Gait training;Stair training;Functional mobility training;Therapeutic activities;Patient/family education;Therapeutic exercise    PT Goals (Current goals can be found in the Care Plan section)  Acute Rehab PT Goals Patient Stated Goal: return home after rehab PT Goal Formulation: With patient/family Time For Goal Achievement: 09/28/18 Potential to Achieve Goals: Good    Frequency Min 3X/week   Barriers to discharge        Co-evaluation               AM-PAC PT "6 Clicks" Mobility  Outcome Measure Help needed turning from your back to your side while in a flat bed without using bedrails?: A Lot Help needed moving from  lying on your back to sitting on the side of a flat bed without using bedrails?: A Lot Help needed moving to and from a bed to a chair (including a wheelchair)?: A Lot Help needed standing up from a chair using your arms (e.g., wheelchair or bedside chair)?: A Lot Help needed to walk in hospital room?: Total Help needed climbing 3-5 steps with a railing? : Total 6 Click Score: 10    End of Session Equipment Utilized During Treatment: Gait belt;Oxygen Activity Tolerance: Patient tolerated treatment well;Patient limited by fatigue Patient left: in chair;with call bell/phone within reach;with family/visitor present Nurse Communication: Mobility status PT Visit Diagnosis: Unsteadiness on feet (R26.81);Other abnormalities of gait and mobility (R26.89);Muscle weakness (generalized) (M62.81)    Time: 7588-3254 PT Time Calculation (min) (ACUTE ONLY): 35 min   Charges:   PT Evaluation $PT Eval Moderate Complexity: 1 Mod PT Treatments $Therapeutic Activity: 23-37 mins        11:59 AM, 09/14/18 Lonell Grandchild, MPT Physical Therapist with [facility] [phone] office [phone] mobile phone

## 2018-09-15 ENCOUNTER — Inpatient Hospital Stay (HOSPITAL_COMMUNITY): Payer: Medicare Other

## 2018-09-15 DIAGNOSIS — Z7189 Other specified counseling: Secondary | ICD-10-CM

## 2018-09-15 DIAGNOSIS — R410 Disorientation, unspecified: Secondary | ICD-10-CM

## 2018-09-15 DIAGNOSIS — Z515 Encounter for palliative care: Secondary | ICD-10-CM

## 2018-09-15 DIAGNOSIS — R451 Restlessness and agitation: Secondary | ICD-10-CM

## 2018-09-15 LAB — CBC
HCT: 31.7 % — ABNORMAL LOW (ref 39.0–52.0)
Hemoglobin: 9.3 g/dL — ABNORMAL LOW (ref 13.0–17.0)
MCH: 29.6 pg (ref 26.0–34.0)
MCHC: 29.3 g/dL — ABNORMAL LOW (ref 30.0–36.0)
MCV: 101 fL — ABNORMAL HIGH (ref 80.0–100.0)
NRBC: 0 % (ref 0.0–0.2)
Platelets: 74 10*3/uL — ABNORMAL LOW (ref 150–400)
RBC: 3.14 MIL/uL — ABNORMAL LOW (ref 4.22–5.81)
RDW: 16.9 % — ABNORMAL HIGH (ref 11.5–15.5)
WBC: 7.5 10*3/uL (ref 4.0–10.5)

## 2018-09-15 LAB — BASIC METABOLIC PANEL
ANION GAP: 10 (ref 5–15)
BUN: 69 mg/dL — ABNORMAL HIGH (ref 8–23)
CO2: 23 mmol/L (ref 22–32)
Calcium: 9 mg/dL (ref 8.9–10.3)
Chloride: 113 mmol/L — ABNORMAL HIGH (ref 98–111)
Creatinine, Ser: 2.78 mg/dL — ABNORMAL HIGH (ref 0.61–1.24)
GFR calc Af Amer: 23 mL/min — ABNORMAL LOW (ref >60)
GFR calc non Af Amer: 20 mL/min — ABNORMAL LOW (ref >60)
Glucose, Bld: 157 mg/dL — ABNORMAL HIGH (ref 70–99)
POTASSIUM: 4.3 mmol/L (ref 3.5–5.1)
Sodium: 146 mmol/L — ABNORMAL HIGH (ref 135–145)

## 2018-09-15 MED ORDER — CHLORPROMAZINE HCL 50 MG/2ML IJ SOLN
INTRAMUSCULAR | Status: AC
  Filled 2018-09-15: qty 2

## 2018-09-15 MED ORDER — ALPRAZOLAM 0.25 MG PO TABS
0.2500 mg | ORAL_TABLET | Freq: Three times a day (TID) | ORAL | Status: DC | PRN
  Administered 2018-09-15: 0.25 mg via ORAL
  Filled 2018-09-15: qty 1

## 2018-09-15 MED ORDER — LORAZEPAM 2 MG/ML IJ SOLN: 2.0000 mg | INTRAMUSCULAR | Status: DC | PRN

## 2018-09-15 MED ORDER — GLYCOPYRROLATE 0.2 MG/ML IJ SOLN: 0.2000 mg | Freq: Four times a day (QID) | INTRAMUSCULAR | Status: DC

## 2018-09-15 MED ORDER — ALPRAZOLAM 0.25 MG PO TABS: 0.2500 mg | ORAL_TABLET | Freq: Three times a day (TID) | ORAL | Status: DC | PRN

## 2018-09-15 MED ORDER — SODIUM CHLORIDE 0.9 % IV SOLN: 0.5000 mg/h | INTRAVENOUS | Status: DC

## 2018-09-15 MED ORDER — GLYCOPYRROLATE 0.2 MG/ML IJ SOLN: 0.2000 mg | INTRAMUSCULAR | Status: DC | PRN

## 2018-09-15 MED ORDER — HALOPERIDOL LACTATE 5 MG/ML IJ SOLN
3.0000 mg | Freq: Four times a day (QID) | INTRAMUSCULAR | Status: DC | PRN
  Administered 2018-09-15 (×2): 3 mg via INTRAVENOUS
  Filled 2018-09-15 (×2): qty 1

## 2018-09-15 MED ORDER — HALOPERIDOL LACTATE 5 MG/ML IJ SOLN
2.0000 mg | Freq: Once | INTRAMUSCULAR | Status: AC
  Administered 2018-09-15: 2 mg via INTRAVENOUS
  Filled 2018-09-15: qty 1

## 2018-09-15 MED ORDER — SODIUM CHLORIDE 0.45 % IV SOLN
INTRAVENOUS | Status: DC
  Administered 2018-09-15: 12:00:00 via INTRAVENOUS

## 2018-09-15 MED ORDER — MORPHINE 100MG IN NS 100ML (1MG/ML) PREMIX INFUSION: 1.0000 mg/h | INTRAVENOUS | Status: DC

## 2018-09-15 MED ORDER — SODIUM CHLORIDE 0.9 % IV SOLN: 50.0000 mg | Freq: Four times a day (QID) | INTRAVENOUS | Status: DC | PRN

## 2018-09-15 MED ORDER — HYDROMORPHONE BOLUS VIA INFUSION: 0.5000 mg | INTRAVENOUS | Status: DC | PRN

## 2018-09-15 MED ORDER — DIPHENHYDRAMINE HCL 50 MG/ML IJ SOLN
12.5000 mg | Freq: Once | INTRAMUSCULAR | Status: AC
  Administered 2018-09-15: 12.5 mg via INTRAVENOUS
  Filled 2018-09-15: qty 1

## 2018-09-15 MED ORDER — HALOPERIDOL LACTATE 5 MG/ML IJ SOLN
5.0000 mg | Freq: Once | INTRAMUSCULAR | Status: AC
  Administered 2018-09-15: 5 mg via INTRAMUSCULAR
  Filled 2018-09-15: qty 1

## 2018-09-15 MED ORDER — SCOPOLAMINE 1 MG/3DAYS TD PT72: 1.0000 | MEDICATED_PATCH | TRANSDERMAL | Status: DC

## 2018-09-15 MED ORDER — QUETIAPINE FUMARATE 25 MG PO TABS: 25.0000 mg | ORAL_TABLET | Freq: Every day | ORAL | Status: DC

## 2018-09-15 MED ORDER — LORAZEPAM 2 MG/ML IJ SOLN
1.0000 mg | Freq: Once | INTRAMUSCULAR | Status: AC
  Administered 2018-09-15: 1 mg via INTRAVENOUS
  Filled 2018-09-15: qty 1

## 2018-09-15 MED ORDER — LORAZEPAM 2 MG/ML IJ SOLN
1.0000 mg | INTRAMUSCULAR | Status: DC | PRN
  Administered 2018-09-15: 1 mg via INTRAVENOUS
  Filled 2018-09-15: qty 1

## 2018-09-15 MED ORDER — FENTANYL CITRATE (PF) 100 MCG/2ML IJ SOLN
25.0000 ug | INTRAMUSCULAR | Status: DC | PRN
  Administered 2018-09-15: 50 ug via INTRAVENOUS
  Administered 2018-09-15: 25 ug via INTRAVENOUS
  Filled 2018-09-15 (×2): qty 2

## 2018-09-15 MED ORDER — SODIUM CHLORIDE 0.9 % IV SOLN
25.0000 mg | Freq: Four times a day (QID) | INTRAVENOUS | Status: DC | PRN
  Administered 2018-09-15: 25 mg via INTRAVENOUS
  Filled 2018-09-15: qty 1

## 2018-09-15 NOTE — Progress Notes (Signed)
Call placed to [facility] for preparation and mixture of Chlorpromazine 25 mg medication. May be given Q6 prn. AC will bring up once medication is ready. Family has been informed that this will be the next medication tried. Will update night shift RN.

## 2018-09-15 NOTE — Consult Note (Signed)
Consultation Note Date: 09/15/2018   Patient Name: Kyle [name]  DOB: 14-Feb-1934  MRN: [id number]  Age / Sex: 85 y.o., male  PCP: Mikey Kirschner, MD Referring Physician: Rodena Goldmann, DO  Reason for Consultation: Establishing goals of care  HPI/Patient Profile: 85 y.o. male  with extensive past medical history including COPD, OSA on CPAP, CAD, STEMI, AAA, adenocarcinoma right lung, BPH, bladder cancer, TURP, bilateral cystic renal masses, CKD, type 2 DM, HTN, HLD, insomnia, ITP, peripheral neuropathy admitted on 09/17/2018 with acute hypoxemic respiratory failure in the setting of COPD exacerbation and influenza A infection. Course of hospitalization complicated by severe delirium and agitation poorly responsive to medications. Palliative medicine consultation for goals of care.    Clinical Assessment and Goals of Care:  [id number]: I have reviewed medical records, discussed with care team, and met with patient, wife [name]), son Leroy Sea), and DIL Angus Palms) at bedside to discuss diagnosis, prognosis, GOC, EOL wishes, disposition and options.  Introduced Palliative Medicine as specialized medical care for people living with serious illness. It focuses on providing relief from the symptoms and stress of a serious illness.  Patient oriented to name otherwise disoriented and remains agitated and restless during my entire visit. Moaning, pulling at gown and oxygen, and continuously trying to get OOB.  We discussed a brief life review of the patient. Prior to last hospitalization, patient living home with wife. Baseline was ambulatory with walker, ability to dress self, and fair appetite. Wife shares that majority of his day is spent in the recliner. Reviewed his extensive medical history. Family jokes that he has "26 lives." No diagnosed history of dementia.   Discussed events leading up to hospitalization and  course of hospitalization including diagnoses and interventions. Family shares that delirium started over the weekend and he has not slept in 72 hours. They share how difficult it has been for them to watch his ongoing agitation and discomfort.   Discussed medication regimen including trial of Seroquel. Educated on delirium precautions. Discussed plan for SLP evaluation and possible CT head if he is not showing improvement.   I attempted to elicit values and goals of care important to the patient and family. Advanced directives, concepts specific to code status, artifical feeding and hydration, and rehospitalization were considered and discussed. Reviewed his HCPOA and living will paperwork that was completed in 2012. [name] is primary POA and son, [name] is secondary POA.   [name] confirms her husbands wishes for DNR code status. She shares that he would NOT wish for prolonged heroic interventions if not showing improvement. She shares his poor quality of life prior to hospitalization with extensive medical conditions throughout his life.    Introduced and completed MOST form. DNR/DNI, comfort measures, IVF/ABX for time trial, and NO feeding tube.   Further discussed benefit of performing CT scan. Explained that this would give them peace of mind for why he is agitated and disoriented but would not necessarily change the plan of care if acute issue found. [name] shares that she  does not wish to put him through more testing and procedures. She declines CT scan. She does not want to continue to prolong his suffering.    The difference between aggressive medical intervention and comfort care was considered in light of the patient's goals of care.   After further discussion, plan is for SLP evaluation. RN to give prn Haldol after SLP eval and trial of Seroquel this evening. If patient does not show improvement in AM, family and PMT to further discuss comfort/hospice tomorrow. Updated Dr. Manuella Ghazi and RN.     ADDENDUM 1530: Received message from Dr. Manuella Ghazi and RN stating family was ready for transition to comfort measures today.   F/u with family in waiting area. Wife [name]) and son [name]) present. [name] is tearful but confirms her decision to focus on Fortunato's comfort and allow him peace and dignity. She does not wish to prolong his suffering and agitation. [name] confirms that him and Leroy Sea agree with transition to comfort measures only. Spiritual, Christian family. [name] shares God has given her peace and acceptance of this decision.   Discussed transition to comfort measures and discontinuation of interventions not aimed at comfort. Discussed symptom management regimen and plan to start prn's today and further evaluate in AM if he will require scheduled or continuous infusion to maintain comfort. Discussed EOL expectations and comfort feeds.   Educated on difference between palliative versus hospice. Family wishes to work on getting him more comfortable tonight and will further discuss hospice services in AM. They have hired a private caregiver to stay at bedside overnight.   Emotional/spiritual support provided. Hard Choices and Gone from my Sight book given. PMT contact information given.    SUMMARY OF RECOMMENDATIONS    After extensive [place] discussion with wife and children, decision made for transition to comfort measures only. Wife confirms patients wishes for DNR/DNI, comfort measures, and NO feeding tube. Family does not want to prolong his suffering.   Discussed transition to comfort measures. Family understands and request that interventions not aimed at comfort to be discontinued.   Symptom management--see below. May need scheduled doses or continuous infusion if requiring frequent prns.   Comfort feeds per patient/family request.  PMT to f/u in AM to further discuss hospice options with family.   Code Status/Advance Care Planning:  DNR  Symptom Management:   Fentanyl 25-7mg  IV q2h prn pain/dyspnea/air hunger  Haldol 353mIV q6h prn agitation  Ativan 58m22mV q4h prn anxiety/agitation  Robinul 0.2mg62m q4h prn secretions  Seroquel 25mg66mHS  Palliative Prophylaxis:   Aspiration, Delirium Protocol, Oral Care and Turn Reposition  Psycho-social/Spiritual:   Desire for further Chaplaincy support:yes  Additional Recommendations: Caregiving  Support/Resources, Compassionate Wean Education and Education on Hospice  Prognosis:   < 2 weeks  Discharge Planning: To Be Determined      Primary Diagnoses: Present on Admission: . Acute respiratory failure with hypoxemia (HCC) [place] essential HTN . CAD (coronary artery disease) . COPD exacerbation (HCC) Palmasastroesophageal reflux disease . Depression . Thrombocytopenia (HCC) [place]acco abuse . Hyperkalemia . Bilirubinemia . Influenza A   I have reviewed the medical record, interviewed the patient and family, and examined the patient. The following aspects are pertinent.  Past Medical History:  Diagnosis Date  . AAA (abdominal aortic aneurysm) (HCC) [place]   s/p repair 2004; 4.3 cm infrarenal in 05/2011  . Abnormality of gait 02/23/2013  . Adenocarcinoma of right lung (HCC) [place]/2012   Ct A/P 2012:  2cm lung mass RLL PET 0092:  Hypermetabolic RLL mass, no other hypermetabolic areas. TTNA 02/2011:  Adenocarcinoma, markers c/w lung origin Right lower lobe superior segmentectomy. 04/01/2011 Dr. Arlyce Dice   . Adenocarcinoma, lung ([place]) 01/2011   transthoracic FNA; resection of the superior segment of the RLL in 03/2011; negative nodes; no chemotherapy nor radiation planned  . Arm fracture    right arm  . Arteriosclerotic cardiovascular disease (ASCVD) 1973, 12/2010   S/P NSTEMI secondary to distal RCA/PL lesion, tx medically.  EF of  55%-60% per  echo.  . Benign prostatic hypertrophy    s/p transurethral resection of the prostate  . Bilateral renal masses    Cystic, more prominent on CT in 12/2010 than 2007;  followed by Dr. Rosana Hoes  . Bladder cancer [facility]) 1996   Transurethral resection of the bladder + chemotherapy/BCG as premed  . Cataract   . Chronic kidney disease    Creatinine 1.4 on discharge 12/20/2100; proteinuria; normal renal ultrasound in 2010; recent creatinines of 1.7-2.; Bilateral cystic renal masses by CT in 2011  . COPD (chronic obstructive pulmonary disease) ([place])   . Coronary artery disease   . Cough    thick phlegm  . Diabetes mellitus    Type II  . Diplopia 02/23/2013  . Diverticulosis   . Essential and other specified forms of tremor 02/23/2013  . Hx of Clostridium difficile infection   . Hyperlipidemia   . Hypertension   . Insomnia   . ITP (idiopathic thrombocytopenic purpura) 09/07/2012   Chronic ITP of adults versus medication-induced ITP.  Stable  . Myocardial infarction ([place])   . Nephrolithiasis 2012   ARF in 01/2011 due to obstructing nephrolithiasis  . Obesity   . OSA (obstructive sleep apnea)   . Polyneuropathy in diabetes(357.2) 02/23/2013  . Thrombocytopenia ([place])   . Tobacco abuse    50-pack-year consumption; quit in 12/2010  . Tubular adenoma of colon   . Ventral hernia    Social History   Socioeconomic History  . Marital status: Married    Spouse name: [name]   . Number of children: 2  . Years of education: 12+  . Highest education level: Not on file  Occupational History  . Occupation: Retired     Fish farm manager: [employer]  Social Needs  . Financial resource strain: Not on file  . Food insecurity:    Worry: Not on file    Inability: Not on file  . Transportation needs:    Medical: Not on file    Non-medical: Not on file  Tobacco Use  . Smoking status: Former Smoker    Packs/day: 1.00    Years: 50.00    Pack years: 50.00    Types: Cigarettes    Start date: 07/22/1955  . Smokeless tobacco: Never Used  . Tobacco comment: "smokes a few days a week" 09/29/17  Substance and Sexual Activity  . Alcohol use: No    Alcohol/week: 0.0 standard drinks  .  Drug use: No  . Sexual activity: Not Currently  Lifestyle  . Physical activity:    Days per week: Not on file    Minutes per session: Not on file  . Stress: Not on file  Relationships  . Social connections:    Talks on phone: Not on file    Gets together: Not on file    Attends religious service: Not on file    Active member of club or organization: Not on file    Attends meetings of clubs or  organizations: Not on file    Relationship status: Not on file  Other Topics Concern  . Not on file  Social History Narrative   Patient lives at home with his wife [name].    Patient has 2 children.    Patient is retired.    Patient is right handed.    Patient has 2 years of college.    3-4 cups of caffeine daily.   Family History  Problem Relation Age of Onset  . Aortic aneurysm Mother   . Emphysema Father        smoker  . Clotting disorder Father   . Arthritis Father   . Hypertension Father   . Diabetes Father   . Aortic aneurysm Father   . Tremor Father   . Stroke Paternal Grandmother   . Other Paternal Grandfather        brain aneurysm  . Colon cancer Cousin    Scheduled Meds: . chlorhexidine  15 mL Mouth Rinse BID  . citalopram  20 mg Oral Daily  . mouth rinse  15 mL Mouth Rinse q12n4p  . QUEtiapine  25 mg Oral q1800   Continuous Infusions: . sodium chloride 10 mL/hr at 09/15/18 1536   PRN Meds:.acetaminophen **OR** acetaminophen, fentaNYL (SUBLIMAZE) injection, glycopyrrolate, haloperidol lactate, levalbuterol, LORazepam, nitroGLYCERIN, sodium chloride Medications Prior to Admission:  Prior to Admission medications   Medication Sig Start Date End Date Taking? Authorizing Provider  Acetylcysteine (NAC PO) Take 1 capsule by mouth 2 (two) times daily. 600 mg   Yes [provider]  albuterol (PROAIR HFA) 108 (90 Base) MCG/ACT inhaler INHALE 2 PUFFS INTO THE LUNGS 4 TIMES DAILY AS NEEDED. 07/28/18  Yes Juanito Doom, MD  ALPRAZolam Duanne Moron) 0.5 MG tablet Take 1  tablet (0.5 mg total) by mouth at bedtime. 09/01/18  Yes Granville Lewis C, PA-C  aspirin EC 81 MG tablet Take 81 mg by mouth every morning.    Yes [provider]  budesonide-formoterol (SYMBICORT) 160-4.5 MCG/ACT inhaler Inhale 2 puffs into the lungs 2 (two) times daily. 08/31/18  Yes Barton Dubois, MD  cholecalciferol (VITAMIN D) 1000 units tablet Take 2,000 Units by mouth every morning.    Yes [provider]  citalopram (CELEXA) 20 MG tablet TAKE ONE (1) TABLET BY MOUTH EVERY DAY 08/11/18  Yes Mikey Kirschner, MD  clopidogrel (PLAVIX) 75 MG tablet Take 75 mg by mouth every morning.   Yes [provider]  Cyanocobalamin (B-12) 2500 MCG TABS Take 1 tablet by mouth every morning.   Yes [provider]  doxycycline (VIBRAMYCIN) 100 MG capsule Take 100 mg by mouth 2 (two) times daily. 09/08/18 09/15/18 Yes [provider]  fluticasone (FLONASE) 50 MCG/ACT nasal spray Place 1 spray into both nostrils daily. 11/09/17  Yes Mikey Kirschner, MD  glucosamine-chondroitin 500-400 MG tablet Take 1 tablet by mouth 2 (two) times daily.   Yes [provider]  ipratropium-albuterol (DUONEB) 0.5-2.5 (3) MG/3ML SOLN Take 3 mLs by nebulization every 6 (six) hours. 07/09/19 - 09/15/18 then give every 6 hours as needed starting on 2018/10/01   Yes [provider]  metoprolol tartrate (LOPRESSOR) 25 MG tablet Take 25 mg by mouth 2 (two) times daily.   Yes [provider]  niacin (SLO-NIACIN) 500 MG tablet Take 1,000 mg by mouth at bedtime.   Yes [provider]  nitroGLYCERIN (NITROSTAT) 0.4 MG SL tablet Place 1 tablet (0.4 mg total) under the tongue every 5 (five) minutes as needed. Call  MD if need more than 2 04/18/16  Yes Mikey Kirschner, MD  NON FORMULARY CPAP while sleeping with Oxygen bleed in at @@ 2.5 L/min 08/31/18  Yes [provider]  NON FORMULARY Diet Type:  [name], consistent Carbohydrate 08/31/18  Yes [provider]    OLANZapine (ZYPREXA) 5 MG tablet Take 5 mg by mouth every evening.    Yes [provider]  OXYGEN Inhale into the lungs. Taper Oxygen to maintain Stat above 90 09/02/18  Yes [provider]  pravastatin (PRAVACHOL) 80 MG tablet TAKE ONE (1) TABLET EACH DAY 08/11/18  Yes Mikey Kirschner, MD  Probiotic Product (PROBIOTIC PEARLS ADVANTAGE) CAPS Take 1 capsule by mouth daily.   Yes [provider]  sodium bicarbonate 650 MG tablet Take 1,300 mg by mouth 2 (two) times daily.   Yes [provider]  tiotropium (SPIRIVA HANDIHALER) 18 MCG inhalation capsule PLACE ONE CAPSULE INTO INHALER AND INHALE DAILY 07/23/18  Yes Kathyrn Drown, MD   Allergies  Allergen Reactions  . Codeine Anaphylaxis  . Etodolac Other (See Comments)    dizziness  . Zanaflex [Tizanidine Hcl] Other (See Comments)    Drowsiness   Review of Systems  Unable to perform ROS: Mental status change   Physical Exam Vitals signs and nursing note reviewed.  Constitutional:      Appearance: He is ill-appearing.  Pulmonary:     Effort: No tachypnea, accessory muscle usage or respiratory distress.  Abdominal:     Tenderness: There is no abdominal tenderness.  Skin:    General: Skin is warm and dry.     Coloration: Skin is pale.  Neurological:     Mental Status: He is easily aroused.     Comments: Oriented to name. Otherwise, disoriented, agitated, and restless.  Psychiatric:        Attention and Perception: He is inattentive.        Speech: Speech is delayed.        Behavior: Behavior is uncooperative and agitated.        Cognition and Memory: Cognition is impaired.    Vital Signs: BP (!) 195/95 (BP Location: Right Arm) Comment: Notified Nurse   Pulse 71   Temp 97.8 F (36.6 C)   Resp (!) 21   Ht 6' (1.829 m)   Wt 112.9 kg   SpO2 96%   BMI 33.76 kg/m  Pain Scale: PAINAD POSS *See Group Information*: 1-Acceptable,Awake and alert Pain Score: 0-No pain   SpO2: SpO2: 96 % O2  Device:SpO2: 96 % O2 Flow Rate: .O2 Flow Rate (L/min): 4 L/min  IO: Intake/output summary:   Intake/Output Summary (Last 24 hours) at 09/15/2018 1604 Last data filed at 09/15/2018 1500 Gross per 24 hour  Intake 361.9 ml  Output -  Net 361.9 ml    LBM: Last BM Date: 09/13/18 Baseline Weight: Weight: 112.9 kg Most recent weight: Weight: 112.9 kg     Palliative Assessment/Data: PPS 20%   Flowsheet Rows     Most Recent Value  Intake Tab  Referral Department  Hospitalist  Unit at Time of Referral  Med/Surg Unit  Palliative Care Primary Diagnosis  Other (Comment)  Date Notified  09/15/18  Reason for referral  Clarify Goals of Care, End of [place]  Date first seen by Palliative Care  09/15/18  # of days Palliative referral response time  0 Day(s)  Clinical Assessment  Palliative Performance Scale Score  20%  Psychosocial & Spiritual Assessment  Palliative  Care Outcomes  Patient/Family meeting held?  Yes  Who was at the meeting?  wife, two sons  Palliative Care Outcomes  Improved pain interventions, Improved non-pain symptom therapy, Clarified goals of care, Counseled regarding hospice, Provided end of life care assistance, Provided advance care planning, Provided psychosocial or spiritual support, Changed to focus on comfort, ACP counseling assistance      Time In/Out: 1100-1215, 1500-1540  Time Total: 115 min Greater than 50%  of this time was spent counseling and coordinating care related to the above assessment and plan.  Signed by:  Ihor Dow, DNP, FNP-C Palliative Medicine Team  Phone: [phone] Fax: [phone]   Please contact Palliative Medicine Team phone at [phone] for questions and concerns.  For individual provider: See Shea Evans

## 2018-09-15 NOTE — Progress Notes (Addendum)
PROGRESS NOTE    Kyle [name]  EPP:[id number] DOB: 1933/11/01 DOA: 08/30/2018 PCP: Mikey Kirschner, MD   Brief Narrative:  Per HPI: 85 y.o.malewith medical history significant ofbut not limited to AAA, normal gait, adenocarcinoma right lung, right arm fracture, CAD, history of an STEMI, BPH, history of TURP, bilateral cystic renal masses, history of bladder cancer, history of transurethral resection of bladder cancer lesion plus chemotherapy, cataracts, COPD, chronic kidney disease, type 2 diabetes, diplopia, diverticulosis, hypertension, hyperlipidemia, insomnia ITP, nephrolithiasis, obesity, OSA on CPAP, diabetic peripheral polyneuropathy, tobacco use, ventral hernia, recently admitted from 08/27/2018 until 08/31/2018 due to COPD exacerbation and discharged to the [facility] for physical therapy who is now coming to the emergency department due to dyspnea, fever and confusion. There was concern about the patient having an upper respiratory infection or pneumonia at the facility and he was started on doxycycline.  Patient has been admitted with acute hypoxemic respiratory failure in the setting of COPD exacerbation with con commitment to influenza A infection.  He notably has severe delirium and agitation and is responding poorly to multiple medications.  Assessment & Plan:   Principal Problem:   Acute respiratory failure with hypoxemia (HCC) Active Problems:   CAD (coronary artery disease)   Tobacco abuse   Type 2 diabetes mellitus (HCC)   COPD exacerbation (HCC)   Bilirubinemia   Depression   Thrombocytopenia (HCC)   Gastroesophageal reflux disease   Benign essential HTN   Hyperkalemia   Influenza A  1-acute respiratory failure with hypoxemia: In the setting of COPD exacerbation and influenza A infection -Patient is slowly improving; has no longer required the use of BiPAP. -Of note, during recent hospitalization patient required to be discharged on oxygen supplementation  using 2.5 L nasal cannula.  Currently requiring 3-4 L nasal cannula. -Continue breathing treatments, but discontinue steroids and Pulmicort on account of severe agitation. -Started on flutter valve. -has completed treatment for influenza.  -Vancomycin and cefepime now discontinue. Continue treatment with Augmentin, for underlying bronchiectasis; last doe on 09/14/18. -Continue weaning off oxygen supplementation as tolerated  2-influenza A -Continue treatment with Tamiflu -Droplet precautions in place -Patient has completed last dose of Tamiflu on 09/13/2018. -Patient has remained afebrile.  3-stage IV chronic kidney disease -Renal function appears to be stable -At this moment will discontinue IV fluids -Monitor renal function trend and follow electrolytes.  4-mild hyperkalemia-resolved -Potassium within normal limits now. -Continue to follow electrolytes trend.  5-type 2 diabetes mellitus -Continue sliding scale insulin; given use of steroids. -Modified carbohydrate diet has been ordered -Most recent A1c 5.2  6-history of coronary artery disease -Patient denies chest pain -Continue b-blocker, aspirin, Plavix and statins.  7-history of hyperlipidemia -Continue statins.  8-benign essential hypertension -Blood pressure soft on admission -Continue monitoring vital signs. -b-blocker dose cut in half.  9-GERD -continue Pepcid   10-depression/anxiety -Continue the use of Celexa -Continue as needed Xanax; but at decreased  dose.  11-obstructive sleep apnea -Continue the use of CPAP nightly.  12-chronic thrombocytopenia -Will continue monitoring platelets count -Avoid heparin drip -SCDs for DVT prophylaxis.  13-sepsis: Due to influenza -Sepsis criteria met on admission -Sepsis physiology resolved. -Continue treatment for influenza/bronchiectasis.  14-hospital-acquired delirium -we will back up on the use of benzodiazepines -Start Seroquel 25 mg daily at  6 PM which is patient's usual bedtime -Discontinue steroids as noted above -Continue constant reorientation -Patient with worsening dysphagia for which SLP evaluation ordered.  May consider head CT if this persists.  DVT prophylaxis:  SCDs Code Status: DNR Family Communication: Son and wife at bedside along with brother Disposition Plan:  Patient will require SLP evaluation as well as evaluation with palliative care who has been consulted today as family members are interested in comfort measures and they do not want to see him suffering the way he is.  We will try Seroquel at an earlier time to see if this seems to help any with agitated delirium.  Consultants:   Palliative care  Procedures:   See below for x-ray reports.  Antimicrobials:   Augmentin 2/22->  Tamiflu 2/20->  Vancomycin, cefepime, and Flagyl discontinued   Subjective: Patient seen and evaluated today with severe bouts of agitation and delirium noted overnight which did not respond well to Haldol or Ativan, or Benadryl.  He continues to remain somewhat agitated this morning and family members are concerned that he is suffering greatly and would like to consider hospice care.  Objective: Vitals:   09/14/18 2114 09/15/18 0421 09/15/18 0810 09/15/18 0831  BP:    (!) 195/95  Pulse:    71  Resp:    (!) 21  Temp:      TempSrc:      SpO2: 97% (!) 79% 94% (!) 88%  Weight:      Height:        Intake/Output Summary (Last 24 hours) at 09/15/2018 1116 Last data filed at 09/14/2018 1500 Gross per 24 hour  Intake 120 ml  Output 500 ml  Net -380 ml   Filed Weights   09/10/18 0200 09/11/18 0500 09/13/18 0500  Weight: 111 kg 113 kg 112.9 kg    Examination:  General exam: Appears agitated and disoriented Respiratory system: Clear to auscultation. Respiratory effort normal.  Currently on nasal cannula. Cardiovascular system: S1 & S2 heard, RRR. No JVD, murmurs, rubs, gallops or clicks. No pedal  edema. Gastrointestinal system: Abdomen is nondistended, soft and nontender. No organomegaly or masses felt. Normal bowel sounds heard. Central nervous system: Alert. Extremities: No significant edema. Skin: No rashes, lesions or ulcers Psychiatry: Cannot be properly assessed.    Data Reviewed: I have personally reviewed following labs and imaging studies  CBC: Recent Labs  Lab 09/10/18 0357 09/11/18 0439 09/12/18 0514 09/13/18 0408 09/13/18 1404 09/14/18 0520 09/15/18 0432  WBC 8.8 4.3 6.3 5.8 6.2 6.7 7.5  NEUTROABS 7.7 4.0 5.8 5.1  --  5.8  --   HGB 8.1* 7.5* 7.7* 8.4* 8.4* 9.0* 9.3*  HCT 28.2* 25.8* 26.7* 29.0* 29.6* 30.6* 31.7*  MCV 103.3* 105.3* 103.9* 102.8* 103.9* 102.0* 101.0*  PLT 53* 45* 53* 57* 63* 72* 74*   Basic Metabolic Panel: Recent Labs  Lab 08/24/2018 1854 09/10/18 0357 09/13/18 0408 09/13/18 1404 09/15/18 0432  NA 138 140  --  139 146*  K 5.2* 5.1  --  4.0 4.3  CL 106 109  --  109 113*  CO2 22 25  --  22 23  GLUCOSE 145* 110*  --  215* 157*  BUN 55* 58*  --  61* 69*  CREATININE 3.19* 3.41* 3.03* 3.04* 2.78*  CALCIUM 9.0 8.4*  --  8.1* 9.0   GFR: Estimated Creatinine Clearance: 26.6 mL/min (A) (by C-G formula based on SCr of 2.78 mg/dL (H)). Liver Function Tests: Recent Labs  Lab 09/05/2018 1854 09/10/18 0357  AST 14* 13*  ALT 14 12  ALKPHOS 54 40  BILITOT 2.5* 1.9*  PROT 6.5 5.3*  ALBUMIN 3.3* 2.6*   No results for input(s): LIPASE, AMYLASE in the  last 168 hours. No results for input(s): AMMONIA in the last 168 hours. Coagulation Profile: No results for input(s): INR, PROTIME in the last 168 hours. Cardiac Enzymes: No results for input(s): CKTOTAL, CKMB, CKMBINDEX, TROPONINI in the last 168 hours. BNP (last 3 results) No results for input(s): PROBNP in the last 8760 hours. HbA1C: No results for input(s): HGBA1C in the last 72 hours. CBG: Recent Labs  Lab 09/13/18 1616  GLUCAP 150*   Lipid Profile: No results for input(s):  CHOL, HDL, LDLCALC, TRIG, CHOLHDL, LDLDIRECT in the last 72 hours. Thyroid Function Tests: No results for input(s): TSH, T4TOTAL, FREET4, T3FREE, THYROIDAB in the last 72 hours. Anemia Panel: No results for input(s): VITAMINB12, FOLATE, FERRITIN, TIBC, IRON, RETICCTPCT in the last 72 hours. Sepsis Labs: Recent Labs  Lab 09/03/2018 1854 09/05/2018 2008  LATICACIDVEN 2.1* 1.5    Recent Results (from the past 240 hour(s))  Blood Culture (routine x 2)     Status: None   Collection Time: 09/12/2018  6:54 PM  Result Value Ref Range Status   Specimen Description BLOOD RIGHT HAND  Final   Special Requests   Final    BOTTLES DRAWN AEROBIC AND ANAEROBIC Blood Culture adequate volume   Culture   Final    NO GROWTH 5 DAYS Performed at [facility], [address], [place], [place] [address]    Report Status 09/14/2018 FINAL  Final  Blood Culture (routine x 2)     Status: None   Collection Time: 09/13/2018  6:55 PM  Result Value Ref Range Status   Specimen Description LEFT ANTECUBITAL  Final   Special Requests   Final    BOTTLES DRAWN AEROBIC AND ANAEROBIC Blood Culture adequate volume   Culture   Final    NO GROWTH 5 DAYS Performed at [facility], [address], [place], [place] [address]    Report Status 09/14/2018 FINAL  Final  MRSA PCR Screening     Status: None   Collection Time: 09/10/18  8:39 AM  Result Value Ref Range Status   MRSA by PCR NEGATIVE NEGATIVE Final    Comment:        The GeneXpert MRSA Assay (FDA approved for NASAL specimens only), is one component of a comprehensive MRSA colonization surveillance program. It is not intended to diagnose MRSA infection nor to guide or monitor treatment for MRSA infections. Performed at [facility], [address], [place], [place] [address]          Radiology Studies: Dg Chest [facility] 1 View  Result Date: 09/15/2018 CLINICAL DATA:  Tachypnea. EXAM: PORTABLE CHEST 1 VIEW COMPARISON:  09/11/2018 FINDINGS: The patient is rotated  to the right with unchanged cardiomediastinal silhouette including cardiomegaly and prominent hilar vasculature. Aortic atherosclerosis is noted. Pulmonary vascular congestion is similar to the prior study. A small right pleural effusion is unchanged. No pneumothorax is identified. No acute osseous abnormality is seen. IMPRESSION: Unchanged pulmonary vascular congestion and small right pleural effusion. Electronically Signed   By: Logan Bores M.D.   On: 09/15/2018 06:57        Scheduled Meds: . aspirin EC  81 mg Oral q morning - 10a  . chlorhexidine  15 mL Mouth Rinse BID  . cholecalciferol  2,000 Units Oral q morning - 10a  . citalopram  20 mg Oral Daily  . clopidogrel  75 mg Oral q morning - 10a  . famotidine  20 mg Oral Daily  . ipratropium  0.5 mg Nebulization Q8H  .  levalbuterol  0.63 mg Nebulization Q8H  . mouth rinse  15 mL Mouth Rinse q12n4p  . metoprolol tartrate  12.5 mg Oral BID  . niacin  1,000 mg Oral QHS  . pravastatin  80 mg Oral Daily  . QUEtiapine  25 mg Oral q1800  . sodium bicarbonate  1,300 mg Oral BID   Continuous Infusions: . sodium chloride       LOS: 6 days    Time spent: 30 minutes    Carthel Castille Darleen Crocker, DO [facility] Pager [phone]  If 7PM-7AM, please contact night-coverage [email or web address] Password [facility] 09/15/2018, 11:16 AM

## 2018-09-15 NOTE — Progress Notes (Signed)
Pt back on CPAP 10 with 5lpm cann bleed in with full face mask spo2 98%

## 2018-09-15 NOTE — Care Management Important Message (Signed)
Important Message  Patient Details  Name: SHAYON TROMPETER MRN: [id number] Date of Birth: [birth year]   Medicare Important Message Given:  Yes    Londen Bok, Chauncey Reading, RN 09/15/2018, 7:52 AM

## 2018-09-15 NOTE — Progress Notes (Signed)
Physical Therapy Treatment Patient Details Name: Kyle [name] MRN: [id number] DOB: 1933-11-10 Today's Date: 09/15/2018    History of Present Illness Kyle [name] is a 85 y.o. male with medical history significant of but not limited to AAA, normal gait, adenocarcinoma right lung, right arm fracture, CAD, history of an STEMI, BPH, history of TURP, bilateral cystic renal masses, history of bladder cancer, history of transurethral resection of bladder cancer lesion plus chemotherapy, cataracts, COPD, chronic kidney disease, type 2 diabetes, diplopia, diverticulosis, hypertension, hyperlipidemia, insomnia ITP, nephrolithiasis, obesity, OSA on CPAP, diabetic peripheral polyneuropathy, tobacco use, ventral hernia, recently admitted from 08/27/2018 until 08/31/2018 due to COPD exacerbation and discharged to the [facility] for physical therapy who is now coming to the emergency department due to dyspnea, fever and confusion.  There was concern about the patient having an upper respiratory infection or pneumonia at the facility and he was started on doxycycline.    PT Comments    Patient presents very confused and restless with mittens of bilateral hands, patient's spouse present at bedside.  Patient limited to sitting up at bedside due to poor carryover for following instructions, fatigue and generalized weakness.  Patient tolerated sitting up at bedside for approximately 20 minutes before fatiguing and becoming lethargic.  Patient put back to bed and repositioned with 2 person Max assist - RN aware.  Patient will benefit from continued physical therapy in hospital and recommended venue below to increase strength, balance, endurance for safe ADLs and gait.    Follow Up Recommendations  SNF     Equipment Recommendations  None recommended by PT    Recommendations for Other Services       Precautions / Restrictions Precautions Precautions: Fall Restrictions Weight Bearing Restrictions: No     Mobility  Bed Mobility Overal bed mobility: Needs Assistance Bed Mobility: Supine to Sit;Sit to Supine     Supine to sit: Max assist Sit to supine: Max assist   General bed mobility comments: slow labored  Transfers                    Ambulation/Gait                 Stairs             Wheelchair Mobility    Modified Rankin (Stroke Patients Only)       Balance Overall balance assessment: Needs assistance Sitting-balance support: Feet supported;No upper extremity supported Sitting balance-Leahy Scale: Fair Sitting balance - Comments: fair/poor with frequent leaning/falling backwards Postural control: Posterior lean                                  Cognition Arousal/Alertness: Lethargic;Suspect due to medications Behavior During Therapy: Restless;Flat affect Overall Cognitive Status: Impaired/Different from baseline Area of Impairment: Orientation;Attention;Awareness                 Orientation Level: Place;Time Current Attention Level: Selective       Awareness: Anticipatory   General Comments: requires repeated verbal/tactile cueing to complete functional tasks      Exercises      General Comments        Pertinent Vitals/Pain Pain Assessment: Faces Faces Pain Scale: No hurt    Home Living                      Prior Function  PT Goals (current goals can now be found in the care plan section) Acute Rehab PT Goals Patient Stated Goal: return home after rehab PT Goal Formulation: With patient/family Time For Goal Achievement: 09/28/18 Potential to Achieve Goals: Good Progress towards PT goals: Progressing toward goals    Frequency    Min 3X/week      PT Plan Current plan remains appropriate    Co-evaluation              AM-PAC PT "6 Clicks" Mobility   Outcome Measure  Help needed turning from your back to your side while in a flat bed without using bedrails?: A  Lot Help needed moving from lying on your back to sitting on the side of a flat bed without using bedrails?: A Lot Help needed moving to and from a bed to a chair (including a wheelchair)?: Total Help needed standing up from a chair using your arms (e.g., wheelchair or bedside chair)?: Total Help needed to walk in hospital room?: Total Help needed climbing 3-5 steps with a railing? : Total 6 Click Score: 8    End of Session   Activity Tolerance: Patient tolerated treatment well;Patient limited by fatigue;Patient limited by lethargy Patient left: in bed;with call bell/phone within reach;with bed alarm set Nurse Communication: Mobility status PT Visit Diagnosis: Unsteadiness on feet (R26.81);Other abnormalities of gait and mobility (R26.89);Muscle weakness (generalized) (M62.81)     Time: 3299-2426 PT Time Calculation (min) (ACUTE ONLY): 25 min  Charges:  $Therapeutic Activity: 23-37 mins                     12:34 PM, 09/15/18 Lonell Grandchild, MPT Physical Therapist with [facility] 336 [phone] office [phone] mobile phone

## 2018-09-15 NOTE — Clinical Social Work Note (Signed)
LCSW following. Pt discussed with MD in Progression today. Continued workup in progress including SLP eval and also Palliative Care consult. MD also working to adjust some of pt's medications in light of hospital delirium.   Will await outcome of this workup and continue to assist with SNF vs hospice transition depending on pt/familiy wishes.

## 2018-09-15 NOTE — Evaluation (Signed)
Clinical/Bedside Swallow Evaluation Patient Details  Name: Kyle [name] MRN: [id number] Date of Birth: 12-12-1933  Today's Date: 09/15/2018 Time: SLP Start Time (ACUTE ONLY): 5176 SLP Stop Time (ACUTE ONLY): 1314 SLP Time Calculation (min) (ACUTE ONLY): 29 min  Past Medical History:  Past Medical History:  Diagnosis Date  . AAA (abdominal aortic aneurysm) ([place]) 2004   s/p repair 2004; 4.3 cm infrarenal in 05/2011  . Abnormality of gait 02/23/2013  . Adenocarcinoma of right lung ([place]) 02/24/2011   Ct A/P 2012:  2cm lung mass RLL PET 1607:  Hypermetabolic RLL mass, no other hypermetabolic areas. TTNA 02/2011:  Adenocarcinoma, markers c/w lung origin Right lower lobe superior segmentectomy. 04/01/2011 Dr. Arlyce Dice   . Adenocarcinoma, lung ([place]) 01/2011   transthoracic FNA; resection of the superior segment of the RLL in 03/2011; negative nodes; no chemotherapy nor radiation planned  . Arm fracture    right arm  . Arteriosclerotic cardiovascular disease (ASCVD) 1973, 12/2010   S/P NSTEMI secondary to distal RCA/PL lesion, tx medically.  EF of  55%-60% per  echo.  . Benign prostatic hypertrophy    s/p transurethral resection of the prostate  . Bilateral renal masses    Cystic, more prominent on CT in 12/2010 than 2007; followed by Dr. Rosana Hoes  . Bladder cancer [facility]) 1996   Transurethral resection of the bladder + chemotherapy/BCG as premed  . Cataract   . Chronic kidney disease    Creatinine 1.4 on discharge 12/20/2100; proteinuria; normal renal ultrasound in 2010; recent creatinines of 1.7-2.; Bilateral cystic renal masses by CT in 2011  . COPD (chronic obstructive pulmonary disease) ([place])   . Coronary artery disease   . Cough    thick phlegm  . Diabetes mellitus    Type II  . Diplopia 02/23/2013  . Diverticulosis   . Essential and other specified forms of tremor 02/23/2013  . Hx of Clostridium difficile infection   . Hyperlipidemia   . Hypertension   . Insomnia   . ITP (idiopathic  thrombocytopenic purpura) 09/07/2012   Chronic ITP of adults versus medication-induced ITP.  Stable  . Myocardial infarction ([place])   . Nephrolithiasis 2012   ARF in 01/2011 due to obstructing nephrolithiasis  . Obesity   . OSA (obstructive sleep apnea)   . Polyneuropathy in diabetes(357.2) 02/23/2013  . Thrombocytopenia ([place])   . Tobacco abuse    50-pack-year consumption; quit in 12/2010  . Tubular adenoma of colon   . Ventral hernia    Past Surgical History:  Past Surgical History:  Procedure Laterality Date  . ABDOMINAL AORTIC ANEURYSM REPAIR  2004  . CARDIAC CATHETERIZATION    . COLONOSCOPY  03/19/2010   Dr. Gala Romney -(poor prep) Anal papilla, rectal hyperplastic polyp, tubular adenoma removed splenic flexure, left-sided diverticula  . COLONOSCOPY  11/28/2004   RMR:  Diminutive rectal and left colon polyps as described above, cold  biopsied/removed/  Left sided diverticula. The remainder of the colonic mucosa appeared normal.  . COLONOSCOPY   09/15/01   RMR: Multiple diminutive polyps destroyed with dermolysis as described above/ Multiple small polyps on stalks in the colon resected with snare cautery/ Scattered pan colonic diverticulum/ The remainder of the colonic mucosa appeared normal  . COLONOSCOPY N/A 05/01/2015   Procedure: COLONOSCOPY;  Surgeon: Daneil Dolin, MD;  [facility];  Service: Endoscopy;  Laterality: N/A;  1115  . CYSTECTOMY    . CYSTOSCOPY  04/2014  . CYSTOSTOMY W/ BLADDER BIOPSY    . FLEXIBLE SIGMOIDOSCOPY  2014   Dr. Olevia Perches: tubular adenoma, negative stool studies   . IR EXT NEPHROURETERAL CATH EXCHANGE  07/15/2018  . LUNG LOBECTOMY    . TONSILLECTOMY    . TRANSURETHRAL RESECTION OF PROSTATE    . VIDEO BRONCHOSCOPY WITH ENDOBRONCHIAL NAVIGATION N/A 10/10/2013   Procedure: VIDEO BRONCHOSCOPY WITH ENDOBRONCHIAL NAVIGATION;  Surgeon: Melrose Nakayama, MD;  Location: [facility];  Service: Thoracic;  Laterality: N/A;  NO BLOOD THINNERS BUT PATIENT HAS ITP   . WEDGE RESECTION  04/2011   carcinoma of lung   HPI:  85 y.o.malewith medical history significant ofbut not limited to AAA, normal gait, adenocarcinoma right lung, right arm fracture, CAD, history of an STEMI, BPH, history of TURP, bilateral cystic renal masses, history of bladder cancer, history of transurethral resection of bladder cancer lesion plus chemotherapy, cataracts, COPD, chronic kidney disease, type 2 diabetes, diplopia, diverticulosis, hypertension, hyperlipidemia, insomnia ITP, nephrolithiasis, obesity, OSA on CPAP, diabetic peripheral polyneuropathy, tobacco use, ventral hernia, recently admitted from 08/27/2018 until 08/31/2018 due to COPD exacerbation and discharged to the [facility] for physical therapy who is now coming to the emergency department due to dyspnea, fever and confusion. There was concern about the patient having an upper respiratory infection or pneumonia at the facility and he was started on doxycycline. Patient has been admitted with acute hypoxemic respiratory failure in the setting of COPD exacerbation with con commitment to influenza A infection.  He notably has severe delirium and agitation and is responding poorly to multiple medications. BSE requested and Pt known to this SLP from previous admission a couple of weeks ago and BSE completed with recommendation for regular textures and thin liquids with use of aspiration precautions.   Assessment / Plan / Recommendation Clinical Impression  Clinical swallow evaluation completed at bedside, however Pt restless, confused, and lethargic. He is alert for brief moments and accepts bolus, however will suddenly start snoring. Pt assessed with ice chip trials and ice cream, however no swallow elicited and oral cavity cleaned out. Pt is not consistently alert for safe po intake at this time. Suspect prognosis for oral diet is good with improved alertness. Pt is inappropriate for po intake in his current restless, but  lethargic state including medications, however with improved alertness could likely offer puree and thin liquids (only if Pt requesting). Risk for aspiration is high if fed during his current state. Above discussed with Pt's wife and RN. SLP will check back tomorrow.  SLP Visit Diagnosis: Dysphagia, oropharyngeal phase (R13.12)    Aspiration Risk  Moderate aspiration risk;Risk for inadequate nutrition/hydration(due to altered mental status)    Diet Recommendation NPO;Dysphagia 1 (Puree);Thin liquid(pending Pt alertness and MD approval)   Liquid Administration via: Spoon Medication Administration: Via alternative means    Other  Recommendations Oral Care Recommendations: Oral care prior to ice chip/H20;Staff/trained caregiver to provide oral care Other Recommendations: Clarify dietary restrictions   Follow up Recommendations Skilled Nursing facility      Frequency and Duration min 2x/week  1 week       Prognosis Prognosis for Safe Diet Advancement: Fair Barriers to Reach Goals: Behavior      Swallow Study   General Date of Onset: 08/23/2018 HPI: 85 y.o.malewith medical history significant ofbut not limited to AAA, normal gait, adenocarcinoma right lung, right arm fracture, CAD, history of an STEMI, BPH, history of TURP, bilateral cystic renal masses, history of bladder cancer, history of transurethral resection of bladder cancer lesion plus chemotherapy, cataracts, COPD, chronic kidney disease, type  2 diabetes, diplopia, diverticulosis, hypertension, hyperlipidemia, insomnia ITP, nephrolithiasis, obesity, OSA on CPAP, diabetic peripheral polyneuropathy, tobacco use, ventral hernia, recently admitted from 08/27/2018 until 08/31/2018 due to COPD exacerbation and discharged to the [facility] for physical therapy who is now coming to the emergency department due to dyspnea, fever and confusion. There was concern about the patient having an upper respiratory infection or pneumonia at the  facility and he was started on doxycycline. Patient has been admitted with acute hypoxemic respiratory failure in the setting of COPD exacerbation with con commitment to influenza A infection.  He notably has severe delirium and agitation and is responding poorly to multiple medications. BSE requested and Pt known to this SLP from previous admission a couple of weeks ago and BSE completed with recommendation for regular textures and thin liquids with use of aspiration precautions. Type of Study: Bedside Swallow Evaluation Previous Swallow Assessment: BSE from 08/31/18 Diet Prior to this Study: Regular;Thin liquids Temperature Spikes Noted: No Respiratory Status: Nasal cannula History of Recent Intubation: No Behavior/Cognition: Confused;Lethargic/Drowsy;Requires cueing;Doesn't follow directions Oral Cavity Assessment: Dry Oral Care Completed by SLP: Yes Oral Cavity - Dentition: Adequate natural dentition Vision: Impaired for self-feeding Self-Feeding Abilities: Total assist Patient Positioning: Upright in bed Baseline Vocal Quality: Normal Volitional Cough: Weak;Congested Volitional Swallow: Unable to elicit    Oral/Motor/Sensory Function Overall Oral Motor/Sensory Function: Generalized oral weakness(difficult to assess due to lethargy and confusion)   Ice Chips Ice chips: Impaired Presentation: Spoon Oral Phase Impairments: Reduced lingual movement/coordination;Poor awareness of bolus;Reduced labial seal Oral Phase Functional Implications: Oral holding Pharyngeal Phase Impairments: Unable to trigger swallow   Thin Liquid Thin Liquid: Impaired(tsp ice cream) Presentation: Spoon Oral Phase Impairments: Reduced lingual movement/coordination;Reduced labial seal;Poor awareness of bolus Oral Phase Functional Implications: Oral residue;Oral holding Pharyngeal  Phase Impairments: Unable to trigger swallow Other Comments: removed with oral care    Nectar Thick Nectar Thick Liquid: Not tested    Honey Thick Honey Thick Liquid: Not tested   Puree Puree: Not tested   Solid     Solid: Not tested     Thank you,  Genene Churn, [place]  PORTER,DABNEY 09/15/2018,1:31 PM

## 2018-09-15 NOTE — Progress Notes (Signed)
Received call from family that Kyle [name] remains agitated.  I called and discussed with his son and RN.  I believe he is having terminal agitation and there is some report that haldol has not been effective.    He just received dose of haldol.  I placed order for chlorpromazine 25mg  IV every 6 hours to be tried if haldol is ineffective as it can be more helpful in terminal agitation if more sedation is needed (noted to be trying to climb out of bed).  This could be increased further to 50mg  if needed for treatment of refractory terminal agitation.  Micheline Rough, MD [place] [facility] Team [phone]  NO CHARGE NOTE

## 2018-09-15 NOTE — Progress Notes (Signed)
Night shift floor coverage note.  The patient was seen due to restlessness of despite using alprazolam and haloperidol.  Patient's son and the nursing staff states he seems like these medications calm him down for 30 to 60 minutes, then the patient becomes very restless.  He is allergic to opiates.  I will try a low-dose Benadryl dose 12.5 mg IVP x1 and see if this improves his restlessness.  The patient's son stated he will discuss with his mother and siblings the possibility of making the patient comfort care only.  Tennis Must, MD.

## 2018-09-17 ENCOUNTER — Ambulatory Visit: Payer: Medicare Other | Admitting: Pulmonary Disease

## 2018-09-17 DIAGNOSIS — E785 Hyperlipidemia, unspecified: Secondary | ICD-10-CM | POA: Insufficient documentation

## 2018-09-17 DIAGNOSIS — F323 Major depressive disorder, single episode, severe with psychotic features: Secondary | ICD-10-CM | POA: Insufficient documentation

## 2018-09-19 NOTE — Progress Notes (Signed)
Call to the patients room by the personal sitter and was notified that the patient had taken 3 deep gasps and then expired.  Two nurses verified time of death was 28.  Patients family, on call MD, AC, and [place] donor notified.  Body prepared after family left. And patient placement notified.  [facility] [facility] came and picked the body.  Patient placement notified.

## 2018-09-19 NOTE — Discharge Summary (Signed)
Physician Discharge Summary  Kyle [name]:[id number] DOB: 1934/04/05 DOA: 09-17-2018  PCP: Mikey Kirschner, MD  Admit date: 09-17-2018  Death date: September 24, 2018 1:15 AM  Admitted From:Home  Disposition:  Expired  Brief/Interim Summary: Per HPI: 85 y.o.malewith medical history significant ofbut not limited to AAA, normal gait, adenocarcinoma right lung, right arm fracture, CAD, history of an STEMI, BPH, history of TURP, bilateral cystic renal masses, history of bladder cancer, history of transurethral resection of bladder cancer lesion plus chemotherapy, cataracts, COPD, chronic kidney disease, type 2 diabetes, diplopia, diverticulosis, hypertension, hyperlipidemia, insomnia ITP, nephrolithiasis, obesity, OSA on CPAP, diabetic peripheral polyneuropathy, tobacco use, ventral hernia, recently admitted from 08/27/2018 until 08/31/2018 due to COPD exacerbation and discharged to the [facility] for physical therapy who is now coming to the emergency department due to dyspnea, fever and confusion. There was concern about the patient having an upper respiratory infection or pneumonia at the facility and he was started on doxycycline.  Patient has been admitted with acute hypoxemic respiratory failure in the setting of COPD exacerbation with con commitment to influenza A infection.  He had developed severe delirium and agitation and responded very poorly to multiple medications to include Haldol, Ativan, Benadryl, and fentanyl.  Palliative care had seen patient and discussed further care with family members who are interested in hospice care.  Thorazine was given overnight to help with further agitation episodes and patient became much less agitated and more comfortable.  He subsequently expired under comfort care at 1:15 AM on Sep 24, 2018.  Discharge Diagnoses:  Principal Problem:   Acute respiratory failure with hypoxemia (HCC) Active Problems:   CAD (coronary artery disease)   Tobacco abuse    Type 2 diabetes mellitus (HCC)   COPD exacerbation (HCC)   Bilirubinemia   Depression   Thrombocytopenia (HCC)   Gastroesophageal reflux disease   Benign essential HTN   Hyperkalemia   Influenza A   Palliative care by specialist   Goals of care, counseling/discussion   Agitation   Delirium      Allergies  Allergen Reactions  . Codeine Anaphylaxis  . Etodolac Other (See Comments)    dizziness  . Zanaflex [Tizanidine Hcl] Other (See Comments)    Drowsiness    Consultations:  Palliative care   Procedures/Studies: Dg Chest 2 View  Result Date: 09/11/2018 CLINICAL DATA:  Shortness of breath. EXAM: CHEST - 2 VIEW COMPARISON:  Chest radiograph September 17, 2018 FINDINGS: Stable small to moderate RIGHT pleural effusion, small suspected LEFT pleural effusion. Stable cardiomegaly. Calcified ectatic aorta. Fullness of the pulmonary hila most compatible with vascular shadows. Pulmonary vascular congestion. No pneumothorax. Soft tissue planes and included osseous structures are unchanged. Old LEFT anterior rib fracture. IMPRESSION: 1. Small to moderate RIGHT pleural effusion, small suspected LEFT pleural effusion. 2. Stable cardiomegaly and pulmonary vascular congestion. Electronically Signed   By: Elon Alas M.D.   On: 09/11/2018 19:15   Dg Chest 2 View  Result Date: 08/27/2018 CLINICAL DATA:  Shortness of breath with exertion. History of bladder and lung cancer. EXAM: CHEST - 2 VIEW COMPARISON:  Chest radiograph June 20, 2018 FINDINGS: Cardiac silhouette is mildly enlarged. Tortuous, but possibly ectatic calcified aorta. Chronic interstitial changes with strandy densities RIGHT lung base. Similar small pleural effusion versus pleural thickening posteriorly. No pneumothorax. Soft tissue planes and included osseous structures are unchanged. Osteopenia. IMPRESSION: 1. Stable cardiomegaly. 2. Chronic interstitial changes/COPD with RIGHT lung base atelectasis. Small posterior pleural  effusion versus pleural thickening. 3.  Aortic Atherosclerosis (  ICD10-I70.0). Electronically Signed   By: Elon Alas M.D.   On: 08/27/2018 21:17   Dg Chest Port 1 View  Result Date: 09/15/2018 CLINICAL DATA:  Tachypnea. EXAM: PORTABLE CHEST 1 VIEW COMPARISON:  09/11/2018 FINDINGS: The patient is rotated to the right with unchanged cardiomediastinal silhouette including cardiomegaly and prominent hilar vasculature. Aortic atherosclerosis is noted. Pulmonary vascular congestion is similar to the prior study. A small right pleural effusion is unchanged. No pneumothorax is identified. No acute osseous abnormality is seen. IMPRESSION: Unchanged pulmonary vascular congestion and small right pleural effusion. Electronically Signed   By: Logan Bores M.D.   On: 09/15/2018 06:57   Dg Chest Port 1 View  Result Date: 08/27/2018 CLINICAL DATA:  Dyspnea and fever EXAM: PORTABLE CHEST 1 VIEW COMPARISON:  08/27/2018 FINDINGS: Interval increase in moderate right effusion obscuring the right hemidiaphragm and portions of the right heart border. Pulmonary vascular congestion is noted. Stable cardiomegaly with tortuous atherosclerotic aorta is identified. Atelectasis at the left lung base is noted. No acute nor suspicious osseous abnormalities. IMPRESSION: Cardiomegaly with pulmonary vascular congestion and increase in right effusion since prior. Electronically Signed   By: Ashley Royalty M.D.   On: 09/10/2018 19:16     Discharge Exam: Vitals:   09/15/18 1411 09/15/18 1918  BP:  (!) 152/111  Pulse:  (!) 115  Resp:  (!) 28  Temp:    SpO2: 96% (!) 87%   Vitals:   09/15/18 0831 09/15/18 1130 09/15/18 1411 09/15/18 1918  BP: (!) 195/95   (!) 152/111  Pulse: 71   (!) 115  Resp: (!) 21   (!) 28  Temp:      TempSrc:      SpO2: (!) 88% 95% 96% (!) 87%  Weight:      Height:        The results of significant diagnostics from this hospitalization (including imaging, microbiology, ancillary and laboratory) are  listed below for reference.     Microbiology: Recent Results (from the past 240 hour(s))  Blood Culture (routine x 2)     Status: None   Collection Time: 09/08/2018  6:54 PM  Result Value Ref Range Status   Specimen Description BLOOD RIGHT HAND  Final   Special Requests   Final    BOTTLES DRAWN AEROBIC AND ANAEROBIC Blood Culture adequate volume   Culture   Final    NO GROWTH 5 DAYS Performed at [facility], [address], [place], [place] [address]    Report Status 09/14/2018 FINAL  Final  Blood Culture (routine x 2)     Status: None   Collection Time: 08/30/2018  6:55 PM  Result Value Ref Range Status   Specimen Description LEFT ANTECUBITAL  Final   Special Requests   Final    BOTTLES DRAWN AEROBIC AND ANAEROBIC Blood Culture adequate volume   Culture   Final    NO GROWTH 5 DAYS Performed at [facility], [address], [place], [place] [address]    Report Status 09/14/2018 FINAL  Final  MRSA PCR Screening     Status: None   Collection Time: 09/10/18  8:39 AM  Result Value Ref Range Status   MRSA by PCR NEGATIVE NEGATIVE Final    Comment:        The GeneXpert MRSA Assay (FDA approved for NASAL specimens only), is one component of a comprehensive MRSA colonization surveillance program. It is not intended to diagnose MRSA infection nor to guide or monitor treatment for MRSA infections.  Performed at [facility], [address] [place], [place] [address]      Labs: BNP (last 3 results) Recent Labs    09/06/18 0928 09/08/2018 1854  BNP 104.0* 937.1*   Basic Metabolic Panel: Recent Labs  Lab 08/23/2018 1854 09/10/18 0357 09/13/18 0408 09/13/18 1404 09/15/18 0432  NA 138 140  --  139 146*  K 5.2* 5.1  --  4.0 4.3  CL 106 109  --  109 113*  CO2 22 25  --  22 23  GLUCOSE 145* 110*  --  215* 157*  BUN 55* 58*  --  61* 69*  CREATININE 3.19* 3.41* 3.03* 3.04* 2.78*  CALCIUM 9.0 8.4*  --  8.1* 9.0   Liver Function Tests: Recent Labs  Lab 08/26/2018 1854  09/10/18 0357  AST 14* 13*  ALT 14 12  ALKPHOS 54 40  BILITOT 2.5* 1.9*  PROT 6.5 5.3*  ALBUMIN 3.3* 2.6*   No results for input(s): LIPASE, AMYLASE in the last 168 hours. No results for input(s): AMMONIA in the last 168 hours. CBC: Recent Labs  Lab 09/10/18 0357 09/11/18 0439 09/12/18 0514 09/13/18 0408 09/13/18 1404 09/14/18 0520 09/15/18 0432  WBC 8.8 4.3 6.3 5.8 6.2 6.7 7.5  NEUTROABS 7.7 4.0 5.8 5.1  --  5.8  --   HGB 8.1* 7.5* 7.7* 8.4* 8.4* 9.0* 9.3*  HCT 28.2* 25.8* 26.7* 29.0* 29.6* 30.6* 31.7*  MCV 103.3* 105.3* 103.9* 102.8* 103.9* 102.0* 101.0*  PLT 53* 45* 53* 57* 63* 72* 74*   Cardiac Enzymes: No results for input(s): CKTOTAL, CKMB, CKMBINDEX, TROPONINI in the last 168 hours. BNP: Invalid input(s): POCBNP CBG: Recent Labs  Lab 09/13/18 1616  GLUCAP 150*   D-Dimer No results for input(s): DDIMER in the last 72 hours. Hgb A1c No results for input(s): HGBA1C in the last 72 hours. Lipid Profile No results for input(s): CHOL, HDL, LDLCALC, TRIG, CHOLHDL, LDLDIRECT in the last 72 hours. Thyroid function studies No results for input(s): TSH, T4TOTAL, T3FREE, THYROIDAB in the last 72 hours.  Invalid input(s): FREET3 Anemia work up No results for input(s): VITAMINB12, FOLATE, FERRITIN, TIBC, IRON, RETICCTPCT in the last 72 hours. Urinalysis    Component Value Date/Time   COLORURINE YELLOW 09/11/2018 2030   APPEARANCEUR HAZY (A) 09/11/2018 2030   LABSPEC 1.013 09/11/2018 2030   PHURINE 5.0 09/11/2018 2030   GLUCOSEU 150 (A) 09/11/2018 2030   HGBUR MODERATE (A) 09/11/2018 2030   [place] NEGATIVE 09/11/2018 2030   [place] 09/11/2018 2030   PROTEINUR 100 (A) 09/11/2018 2030   UROBILINOGEN 1.0 10/06/2013 1527   NITRITE NEGATIVE 09/11/2018 2030   LEUKOCYTESUR TRACE (A) 09/11/2018 2030   Sepsis Labs Invalid input(s): PROCALCITONIN,  WBC,  LACTICIDVEN Microbiology Recent Results (from the past 240 hour(s))  Blood Culture (routine x 2)      Status: None   Collection Time: 09/10/2018  6:54 PM  Result Value Ref Range Status   Specimen Description BLOOD RIGHT HAND  Final   Special Requests   Final    BOTTLES DRAWN AEROBIC AND ANAEROBIC Blood Culture adequate volume   Culture   Final    NO GROWTH 5 DAYS Performed at [facility], [address], [place], [place] [address]    Report Status 09/14/2018 FINAL  Final  Blood Culture (routine x 2)     Status: None   Collection Time: 08/24/2018  6:55 PM  Result Value Ref Range Status   Specimen Description LEFT ANTECUBITAL  Final   Special  Requests   Final    BOTTLES DRAWN AEROBIC AND ANAEROBIC Blood Culture adequate volume   Culture   Final    NO GROWTH 5 DAYS Performed at [facility], [address] [place], [place] [address]    Report Status 09/14/2018 FINAL  Final  MRSA PCR Screening     Status: None   Collection Time: 09/10/18  8:39 AM  Result Value Ref Range Status   MRSA by PCR NEGATIVE NEGATIVE Final    Comment:        The GeneXpert MRSA Assay (FDA approved for NASAL specimens only), is one component of a comprehensive MRSA colonization surveillance program. It is not intended to diagnose MRSA infection nor to guide or monitor treatment for MRSA infections. Performed at [facility], [address], [place], [place] [address]      Time coordinating discharge: 35 minutes  SIGNED:   Rodena Goldmann, DO [facility] 10-01-2018, 7:20 AM  If 7PM-7AM, please contact night-coverage [email or web address] Password [email or web address]

## 2018-09-19 DEATH — deceased

## 2018-09-20 ENCOUNTER — Other Ambulatory Visit (HOSPITAL_COMMUNITY): Payer: Medicare Other

## 2018-09-20 ENCOUNTER — Ambulatory Visit (HOSPITAL_COMMUNITY): Payer: Medicare Other

## 2018-09-21 ENCOUNTER — Ambulatory Visit: Payer: Medicare Other | Admitting: Oncology

## 2018-09-21 ENCOUNTER — Other Ambulatory Visit: Payer: Medicare Other

## 2018-09-21 IMAGING — CT CT RENAL STONE PROTOCOL
2 of 4 series · 15 of 46 positions shown, 17 images · non-contrast
Comparison: 06/09/2016 ultrasound. 06/09/2016, 11/29/2015,
10/19/2015 and 09/05/2015 CT. 12/24/[address] chest [address].

CLINICAL DATA: 82-year-old diabetic male with bladder cancer.
Hematuria since last week. Initial encounter.

EXAM:
CT ABDOMEN AND PELVIS WITHOUT CONTRAST
TECHNIQUE: Multidetector CT imaging of the abdomen and pelvis was performed
following the standard protocol without IV contrast.

[Series 3: stone study 5.0 i30f 2 · axial · 0.89mm/px · z∈[+720,+1140]mm · 12 of 96 slices shown, 14 images]
[im 8/96  soft-tissue]
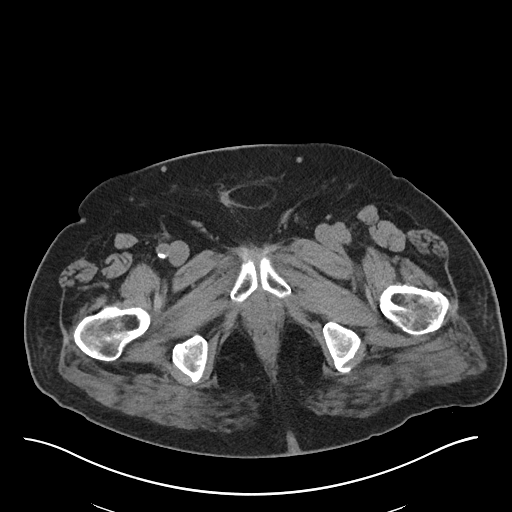
[im 8/96  bone]
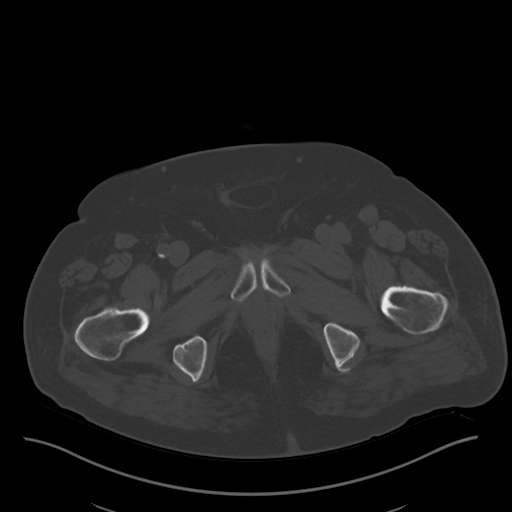
[im 16/96  soft-tissue]
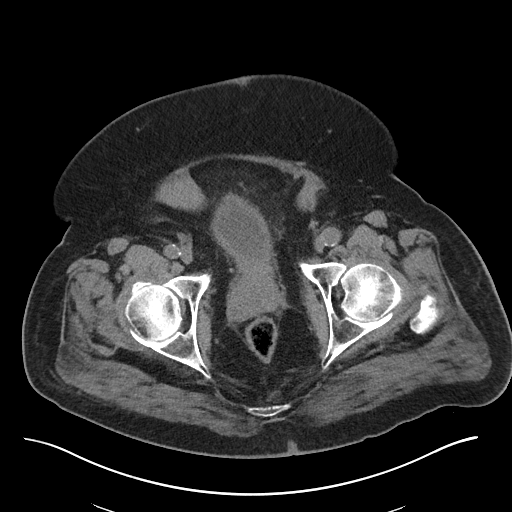
[im 23/96  soft-tissue]
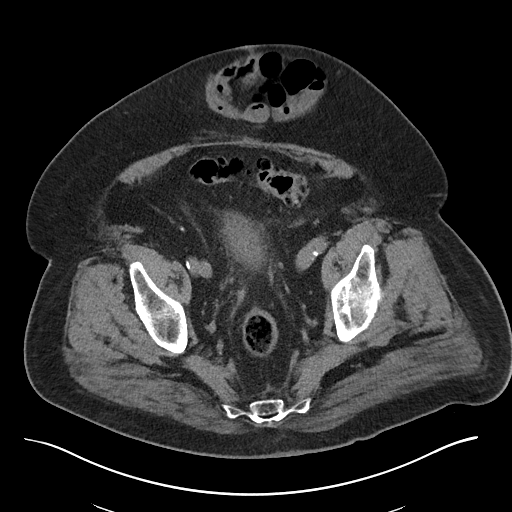
[im 31/96  soft-tissue]
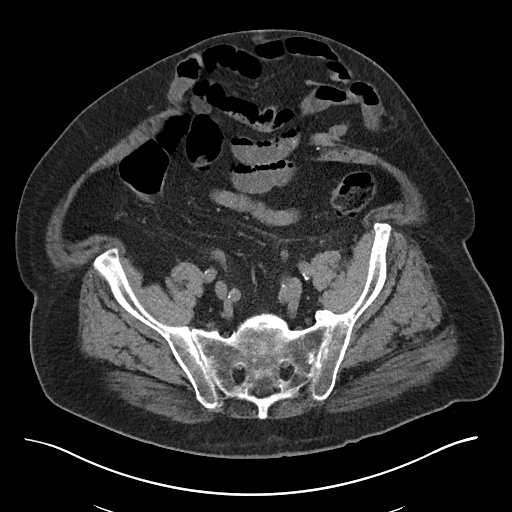
[im 39/96  soft-tissue]
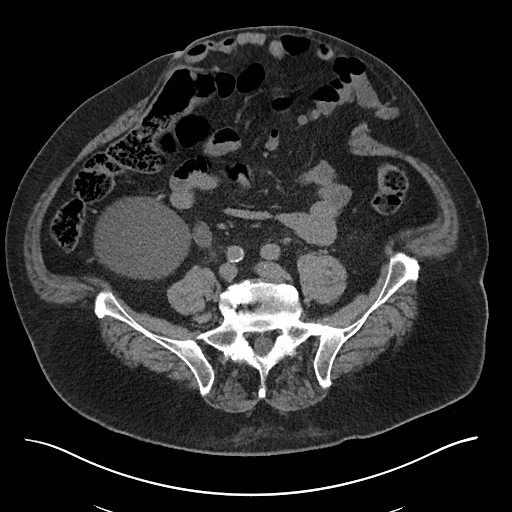
[im 46/96  soft-tissue]
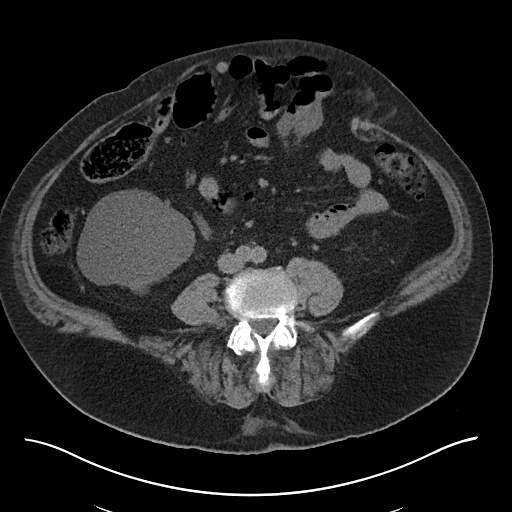
[im 54/96  soft-tissue]
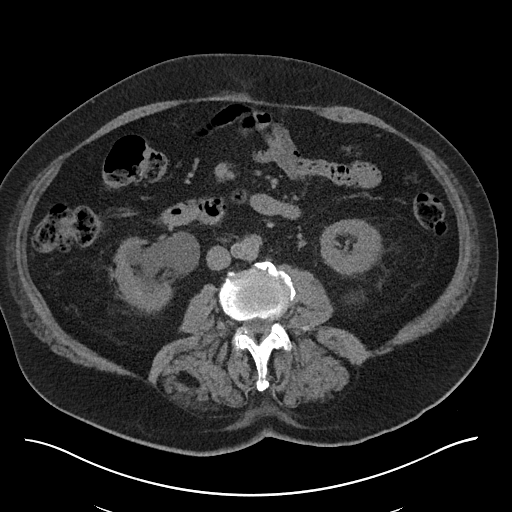
[im 61/96  soft-tissue]
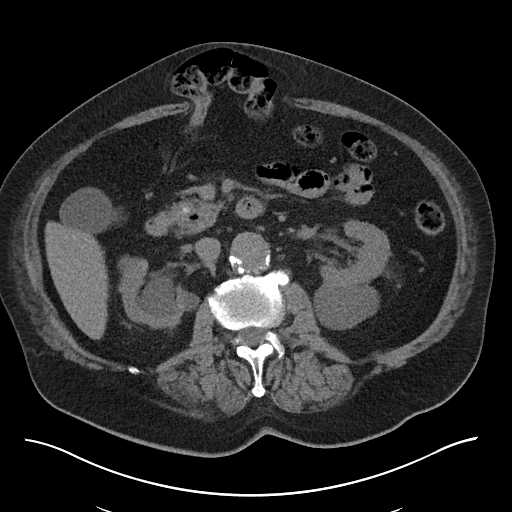
[im 69/96  soft-tissue]
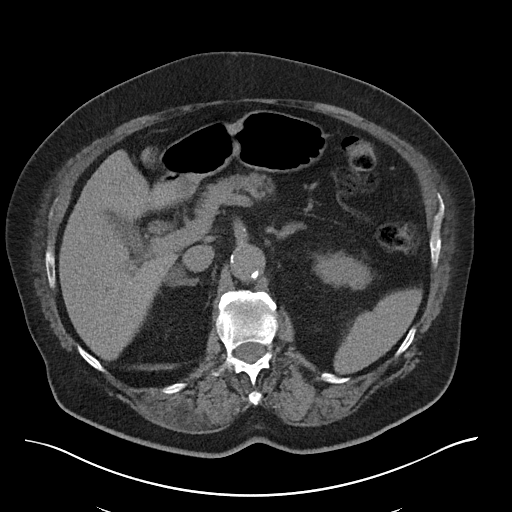
[im 69/96  bone]
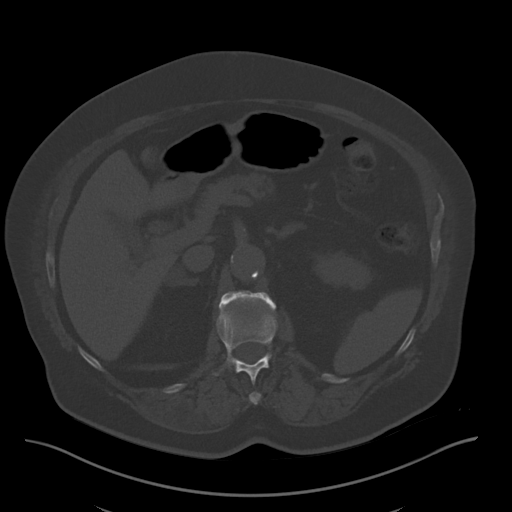
[im 77/96  soft-tissue]
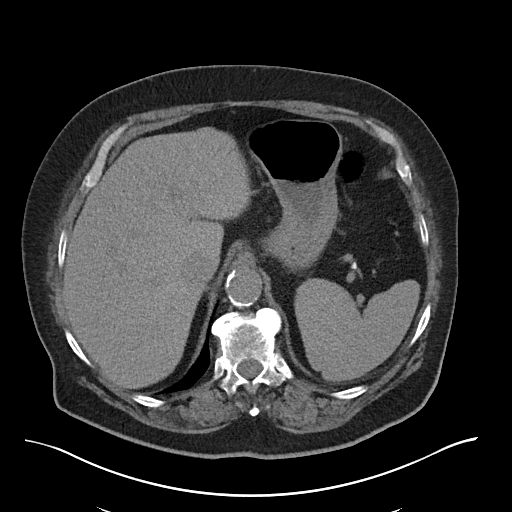
[im 84/96  soft-tissue]
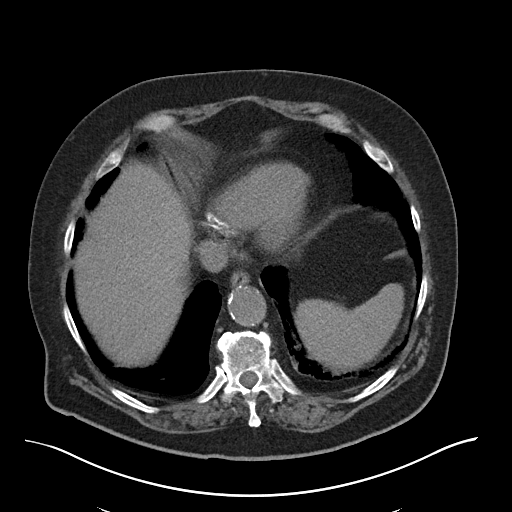
[im 92/96  soft-tissue]
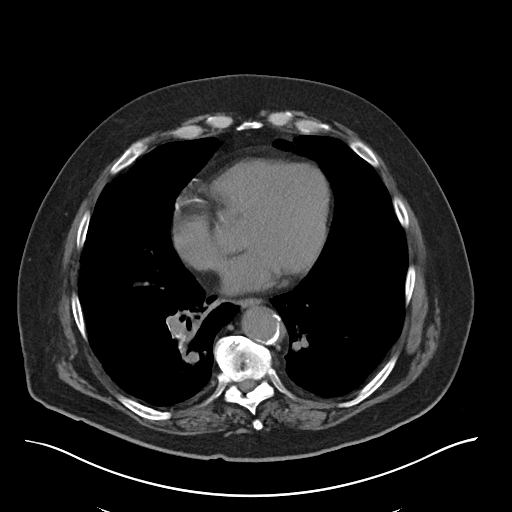

[Series 6: coronal soft tissue · coronal · 0.93mm/px · 3 of 113 slices shown]
[im 38/113  soft-tissue]
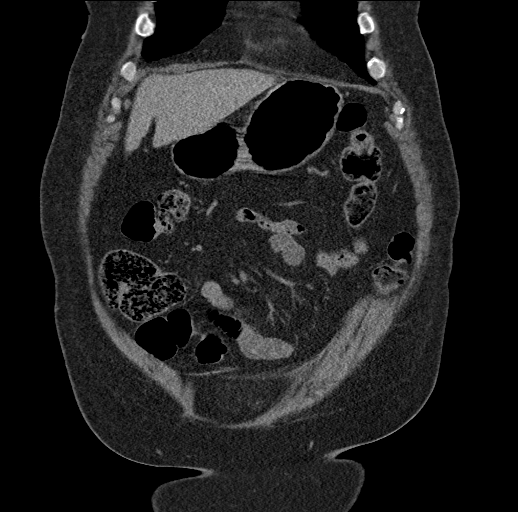
[im 50/113  soft-tissue]
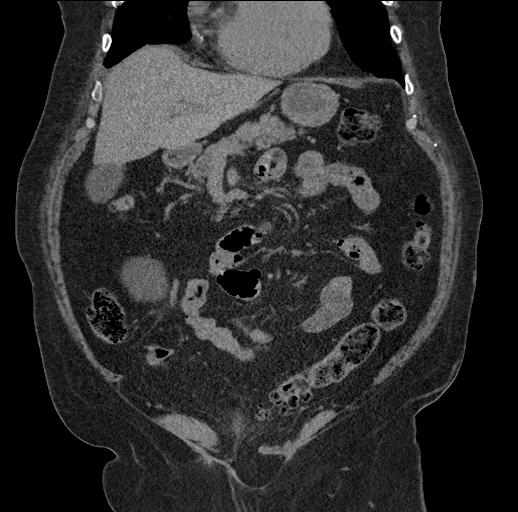
[im 63/113  soft-tissue]
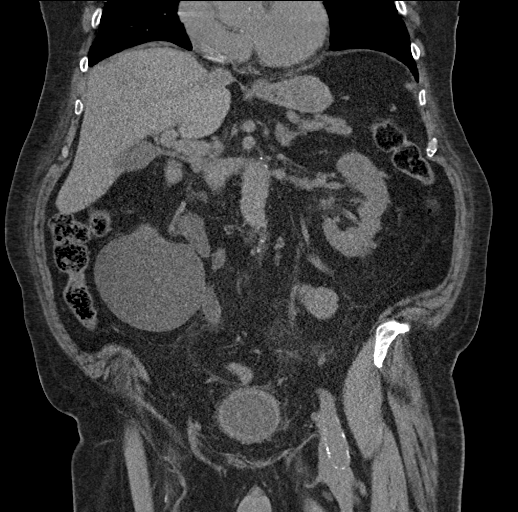

[15 of 46 positions shown; findings below may reference images not displayed]

FINDINGS: Lower chest: Right lower lobe post therapy changes with scarring
similar to prior chest CT.

Coronary artery calcifications. Aortic valve calcifications. Heart
size top-normal.

Hepatobiliary: Taking into account limitation by non contrast
imaging, no worrisome hepatic lesion. 1 cm gallstone.

Pancreas: Taking into account limitation by non contrast imaging,
coarse calcification pancreatic CT level otherwise no mass or
pancreatic duct dilation noted.

Spleen: Taking into account limitation by non contrast imaging, no
mass or enlargement.

Adrenals/Urinary Tract: Marked right-sided hydroureteronephrosis to
the level of the distal ureter level where hyperdense mass versus
blood clot is noted located 7.4 cm proximal to the right
ureterovesical junction.

Bilateral renal cyst largest on the right measures 10.7 cm and on
the left 5.7 cm. Nonobstructing tiny renal calculi noted
bilaterally.

Decompressed urinary bladder with circumferential wall thickening.

Left adrenal 1.1 cm nodule and 3 cm right adrenal lesion without
significant change.

Stomach/Bowel: Large anterior abdominal wall bowel containing hernia
does not appear to be causing obstruction. Duodenal diverticulum.
Colonic diverticulosis.

Vascular/Lymphatic: Atherosclerotic changes aorta with ectasia.
Abdominal aortic aneurysm at the level of the takeoff of the renal
artery is with transverse dimension of 4.8 x 4.4 cm versus
09/05/2015 exam when this measured 4.4 x 4 cm. Prior aneurysm
repair. Left femoral artery aneurysm measures 2.1 cm without
significant change.

No adenopathy.

Reproductive: Small radiopaque structures within and around the
prostate gland.

Other: No free intraperitoneal air.

Musculoskeletal: Degenerative changes most prominent L5-S1.
IMPRESSION: Marked right-sided hydroureteronephrosis to the level of the distal
ureter level where hyperdense mass versus blood clot is noted
located 7.4 cm proximal to the right ureterovesical junction.

Bilateral renal cysts, largest on the right measures 10.7 cm and on
the left 5.7 cm. Nonobstructing tiny renal calculi noted
bilaterally.

Decompressed urinary bladder with circumferential wall thickening.

Left adrenal 1.1 cm nodule and 3 cm right adrenal lesion without
significant change.

Large anterior abdominal wall bowel containing hernia does not
appear to be causing obstruction. Duodenal diverticulum. Colonic
diverticulosis.

**An incidental finding of potential clinical significance has been
found. Abdominal aortic aneurysm at the level of the takeoff of the
renal arteries with transverse dimension of 4.8 x 4.4 cm versus
09/05/2015 exam when this measured 4.4 x 4 cm. Prior aneurysm
repair. Recommend followup by ultrasound in 1 year. This
recommendation follows ACR consensus guidelines: White Paper of the
ACR Incidental Findings Committee II on Vascular Findings. [HOSPITAL] 5109; [DATE].**

Left femoral artery aneurysm measures 2.1 cm without significant
change.

## 2018-11-10 ENCOUNTER — Ambulatory Visit: Payer: Medicare Other | Admitting: Family Medicine
